# Patient Record
Sex: Male | Born: 1945 | Race: White | Hispanic: No | Marital: Married | Smoking: Current every day smoker
Health system: Southern US, Community
[De-identification: ages and names within clinical notes are randomized; demographics above are authoritative.]

## PROBLEM LIST (undated history)

## (undated) DIAGNOSIS — G629 Polyneuropathy, unspecified: Secondary | ICD-10-CM

## (undated) DIAGNOSIS — T8859XA Other complications of anesthesia, initial encounter: Secondary | ICD-10-CM

## (undated) DIAGNOSIS — I5022 Chronic systolic (congestive) heart failure: Secondary | ICD-10-CM

## (undated) DIAGNOSIS — F4310 Post-traumatic stress disorder, unspecified: Secondary | ICD-10-CM

## (undated) DIAGNOSIS — IMO0001 Reserved for inherently not codable concepts without codable children: Secondary | ICD-10-CM

## (undated) DIAGNOSIS — I472 Ventricular tachycardia, unspecified: Secondary | ICD-10-CM

## (undated) DIAGNOSIS — I219 Acute myocardial infarction, unspecified: Secondary | ICD-10-CM

## (undated) DIAGNOSIS — J449 Chronic obstructive pulmonary disease, unspecified: Secondary | ICD-10-CM

## (undated) DIAGNOSIS — I10 Essential (primary) hypertension: Secondary | ICD-10-CM

## (undated) DIAGNOSIS — D649 Anemia, unspecified: Secondary | ICD-10-CM

## (undated) DIAGNOSIS — T814XXS Infection following a procedure, sequela: Secondary | ICD-10-CM

## (undated) DIAGNOSIS — I255 Ischemic cardiomyopathy: Secondary | ICD-10-CM

## (undated) DIAGNOSIS — I739 Peripheral vascular disease, unspecified: Secondary | ICD-10-CM

## (undated) DIAGNOSIS — Z9289 Personal history of other medical treatment: Secondary | ICD-10-CM

## (undated) DIAGNOSIS — E1169 Type 2 diabetes mellitus with other specified complication: Secondary | ICD-10-CM

## (undated) DIAGNOSIS — I25119 Atherosclerotic heart disease of native coronary artery with unspecified angina pectoris: Secondary | ICD-10-CM

## (undated) DIAGNOSIS — E785 Hyperlipidemia, unspecified: Secondary | ICD-10-CM

## (undated) DIAGNOSIS — T4145XA Adverse effect of unspecified anesthetic, initial encounter: Secondary | ICD-10-CM

## (undated) DIAGNOSIS — Z8739 Personal history of other diseases of the musculoskeletal system and connective tissue: Secondary | ICD-10-CM

## (undated) DIAGNOSIS — K219 Gastro-esophageal reflux disease without esophagitis: Secondary | ICD-10-CM

## (undated) DIAGNOSIS — Z8711 Personal history of peptic ulcer disease: Secondary | ICD-10-CM

## (undated) DIAGNOSIS — Z8719 Personal history of other diseases of the digestive system: Secondary | ICD-10-CM

## (undated) DIAGNOSIS — E119 Type 2 diabetes mellitus without complications: Secondary | ICD-10-CM

## (undated) DIAGNOSIS — Z95810 Presence of automatic (implantable) cardiac defibrillator: Secondary | ICD-10-CM

## (undated) HISTORY — PX: INGUINAL HERNIA REPAIR: SUR1180

## (undated) HISTORY — PX: CARDIAC CATHETERIZATION: SHX172

## (undated) HISTORY — DX: Reserved for inherently not codable concepts without codable children: IMO0001

## (undated) HISTORY — DX: Infection following a procedure, sequela: T81.4XXS

---

## 1999-10-05 ENCOUNTER — Other Ambulatory Visit: Admission: RE | Admit: 1999-10-05 | Discharge: 1999-10-05 | Payer: Self-pay | Admitting: Gynecology

## 2011-09-16 ENCOUNTER — Encounter: Payer: Self-pay | Admitting: *Deleted

## 2011-09-16 ENCOUNTER — Emergency Department (HOSPITAL_COMMUNITY): Payer: Medicare Other

## 2011-09-16 ENCOUNTER — Inpatient Hospital Stay (HOSPITAL_COMMUNITY)
Admission: EM | Admit: 2011-09-16 | Discharge: 2011-09-20 | DRG: 728 | Disposition: A | Discharge status: Home / Self Care | Payer: Medicare Other | Source: Ambulatory Visit | Attending: Internal Medicine | Admitting: Internal Medicine

## 2011-09-16 DIAGNOSIS — Z79899 Other long term (current) drug therapy: Secondary | ICD-10-CM

## 2011-09-16 DIAGNOSIS — N453 Epididymo-orchitis: Principal | ICD-10-CM | POA: Diagnosis present

## 2011-09-16 DIAGNOSIS — Z7982 Long term (current) use of aspirin: Secondary | ICD-10-CM

## 2011-09-16 DIAGNOSIS — E7800 Pure hypercholesterolemia, unspecified: Secondary | ICD-10-CM | POA: Diagnosis present

## 2011-09-16 DIAGNOSIS — E785 Hyperlipidemia, unspecified: Secondary | ICD-10-CM | POA: Diagnosis present

## 2011-09-16 DIAGNOSIS — D72829 Elevated white blood cell count, unspecified: Secondary | ICD-10-CM | POA: Diagnosis present

## 2011-09-16 DIAGNOSIS — E1169 Type 2 diabetes mellitus with other specified complication: Secondary | ICD-10-CM

## 2011-09-16 DIAGNOSIS — E871 Hypo-osmolality and hyponatremia: Secondary | ICD-10-CM | POA: Diagnosis present

## 2011-09-16 DIAGNOSIS — E119 Type 2 diabetes mellitus without complications: Secondary | ICD-10-CM | POA: Diagnosis present

## 2011-09-16 DIAGNOSIS — Z6829 Body mass index (BMI) 29.0-29.9, adult: Secondary | ICD-10-CM

## 2011-09-16 DIAGNOSIS — Z23 Encounter for immunization: Secondary | ICD-10-CM

## 2011-09-16 HISTORY — DX: Type 2 diabetes mellitus with other specified complication: E11.69

## 2011-09-16 LAB — URINE MICROSCOPIC-ADD ON

## 2011-09-16 LAB — CBC
HCT: 41 % (ref 39.0–52.0)
MCHC: 35.6 g/dL (ref 30.0–36.0)
MCV: 87.8 fL (ref 78.0–100.0)
Platelets: 247 10*3/uL (ref 150–400)
RDW: 13 % (ref 11.5–15.5)

## 2011-09-16 LAB — COMPREHENSIVE METABOLIC PANEL
AST: 15 U/L (ref 0–37)
Albumin: 3.6 g/dL (ref 3.5–5.2)
BUN: 9 mg/dL (ref 6–23)
Calcium: 9.5 mg/dL (ref 8.4–10.5)
Creatinine, Ser: 0.94 mg/dL (ref 0.50–1.35)
Total Bilirubin: 0.5 mg/dL (ref 0.3–1.2)
Total Protein: 7.3 g/dL (ref 6.0–8.3)

## 2011-09-16 LAB — URINALYSIS, ROUTINE W REFLEX MICROSCOPIC
Glucose, UA: NEGATIVE mg/dL
Ketones, ur: NEGATIVE mg/dL
Protein, ur: NEGATIVE mg/dL
Urobilinogen, UA: 1 mg/dL (ref 0.0–1.0)

## 2011-09-16 MED ORDER — GLIPIZIDE ER 2.5 MG PO TB24
2.5000 mg | ORAL_TABLET | Freq: Every day | ORAL | Status: DC
  Administered 2011-09-17: 2.5 mg via ORAL
  Filled 2011-09-16 (×2): qty 1

## 2011-09-16 MED ORDER — NICOTINE 14 MG/24HR TD PT24
14.0000 mg | MEDICATED_PATCH | Freq: Every day | TRANSDERMAL | Status: DC
  Administered 2011-09-17 – 2011-09-20 (×4): 14 mg via TRANSDERMAL
  Filled 2011-09-16 (×4): qty 1

## 2011-09-16 MED ORDER — ONDANSETRON HCL 4 MG/2ML IJ SOLN
4.0000 mg | Freq: Once | INTRAMUSCULAR | Status: AC
  Administered 2011-09-16: 4 mg via INTRAVENOUS
  Filled 2011-09-16: qty 2

## 2011-09-16 MED ORDER — ENOXAPARIN SODIUM 40 MG/0.4ML ~~LOC~~ SOLN
40.0000 mg | SUBCUTANEOUS | Status: DC
  Administered 2011-09-17 – 2011-09-20 (×4): 40 mg via SUBCUTANEOUS
  Filled 2011-09-16 (×4): qty 0.4

## 2011-09-16 MED ORDER — HYDROCODONE-ACETAMINOPHEN 5-325 MG PO TABS
1.0000 | ORAL_TABLET | ORAL | Status: DC | PRN
  Administered 2011-09-17 – 2011-09-20 (×10): 2 via ORAL
  Filled 2011-09-16 (×10): qty 2

## 2011-09-16 MED ORDER — HYDROMORPHONE HCL PF 1 MG/ML IJ SOLN: 2.0000 mg | INTRAMUSCULAR | Status: DC | PRN

## 2011-09-16 MED ORDER — PIPERACILLIN-TAZOBACTAM 3.375 G IVPB
3.3750 g | Freq: Three times a day (TID) | INTRAVENOUS | Status: DC
  Administered 2011-09-16 – 2011-09-20 (×12): 3.375 g via INTRAVENOUS
  Filled 2011-09-16 (×15): qty 50

## 2011-09-16 MED ORDER — ASPIRIN EC 81 MG PO TBEC
81.0000 mg | DELAYED_RELEASE_TABLET | Freq: Every day | ORAL | Status: DC
  Administered 2011-09-17 – 2011-09-20 (×5): 81 mg via ORAL
  Filled 2011-09-16 (×7): qty 1

## 2011-09-16 MED ORDER — PIPERACILLIN-TAZOBACTAM 3.375 G IVPB 30 MIN: 3.3750 g | Freq: Three times a day (TID) | INTRAVENOUS | Status: DC

## 2011-09-16 MED ORDER — ALBUTEROL SULFATE (5 MG/ML) 0.5% IN NEBU: 2.5000 mg | INHALATION_SOLUTION | RESPIRATORY_TRACT | Status: DC | PRN

## 2011-09-16 MED ORDER — NIACIN ER (ANTIHYPERLIPIDEMIC) 500 MG PO TBCR
1000.0000 mg | EXTENDED_RELEASE_TABLET | Freq: Every day | ORAL | Status: DC
  Administered 2011-09-17 – 2011-09-19 (×4): 1000 mg via ORAL
  Filled 2011-09-16 (×7): qty 2

## 2011-09-16 MED ORDER — MORPHINE SULFATE 4 MG/ML IJ SOLN
4.0000 mg | Freq: Once | INTRAMUSCULAR | Status: AC
  Administered 2011-09-16: 4 mg via INTRAVENOUS
  Filled 2011-09-16: qty 1

## 2011-09-16 MED ORDER — PIPERACILLIN-TAZOBACTAM 3.375 G IVPB
3.3750 g | Freq: Once | INTRAVENOUS | Status: AC
  Administered 2011-09-16: 3.375 g via INTRAVENOUS
  Filled 2011-09-16: qty 50

## 2011-09-16 MED ORDER — ALBUTEROL SULFATE (5 MG/ML) 0.5% IN NEBU
2.5000 mg | INHALATION_SOLUTION | Freq: Four times a day (QID) | RESPIRATORY_TRACT | Status: DC
  Administered 2011-09-17 (×4): 2.5 mg via RESPIRATORY_TRACT
  Filled 2011-09-16 (×4): qty 0.5

## 2011-09-16 MED ORDER — ACETAMINOPHEN 650 MG RE SUPP: 650.0000 mg | Freq: Four times a day (QID) | RECTAL | Status: DC | PRN

## 2011-09-16 MED ORDER — OMEGA-3-ACID ETHYL ESTERS 1 G PO CAPS
2.0000 g | ORAL_CAPSULE | Freq: Every day | ORAL | Status: DC
  Administered 2011-09-17 – 2011-09-20 (×5): 2 g via ORAL
  Filled 2011-09-16 (×6): qty 2

## 2011-09-16 MED ORDER — ACETAMINOPHEN 325 MG PO TABS: 650.0000 mg | ORAL_TABLET | Freq: Four times a day (QID) | ORAL | Status: DC | PRN

## 2011-09-16 MED ORDER — VANCOMYCIN HCL 1000 MG IV SOLR
1250.0000 mg | Freq: Two times a day (BID) | INTRAVENOUS | Status: DC
  Administered 2011-09-17 – 2011-09-20 (×6): 1250 mg via INTRAVENOUS
  Filled 2011-09-16 (×9): qty 1250

## 2011-09-16 MED ORDER — VANCOMYCIN HCL IN DEXTROSE 1-5 GM/200ML-% IV SOLN
1000.0000 mg | Freq: Once | INTRAVENOUS | Status: AC
  Administered 2011-09-16: 1000 mg via INTRAVENOUS
  Filled 2011-09-16: qty 200

## 2011-09-16 MED ORDER — SODIUM CHLORIDE 0.9 % IV SOLN
INTRAVENOUS | Status: DC
  Administered 2011-09-16 – 2011-09-18 (×4): via INTRAVENOUS

## 2011-09-16 MED ORDER — ROSUVASTATIN CALCIUM 20 MG PO TABS
20.0000 mg | ORAL_TABLET | Freq: Every day | ORAL | Status: DC
  Administered 2011-09-17 – 2011-09-20 (×4): 20 mg via ORAL
  Filled 2011-09-16 (×5): qty 1

## 2011-09-16 MED ORDER — INSULIN ASPART 100 UNIT/ML ~~LOC~~ SOLN
0.0000 [IU] | Freq: Three times a day (TID) | SUBCUTANEOUS | Status: DC
  Administered 2011-09-17: 5 [IU] via SUBCUTANEOUS
  Administered 2011-09-17: 3 [IU] via SUBCUTANEOUS
  Administered 2011-09-17 – 2011-09-18 (×3): 2 [IU] via SUBCUTANEOUS
  Administered 2011-09-18: 3 [IU] via SUBCUTANEOUS
  Administered 2011-09-19: 2 [IU] via SUBCUTANEOUS
  Administered 2011-09-19: 3 [IU] via SUBCUTANEOUS
  Administered 2011-09-20: 2 [IU] via SUBCUTANEOUS
  Filled 2011-09-16: qty 3

## 2011-09-16 MED ORDER — METFORMIN HCL 500 MG PO TABS
500.0000 mg | ORAL_TABLET | Freq: Two times a day (BID) | ORAL | Status: DC
  Administered 2011-09-17 – 2011-09-20 (×6): 500 mg via ORAL
  Filled 2011-09-16 (×10): qty 1

## 2011-09-16 NOTE — ED Notes (Signed)
[id number] READY

## 2011-09-16 NOTE — ED Notes (Signed)
Patient transported to Ultrasound 

## 2011-09-16 NOTE — ED Provider Notes (Signed)
History     CSN: [id number]  Arrival date & time 09/16/11  1401   First MD Initiated Contact with Patient 09/16/11 1537      Chief Complaint  Patient presents with  . Testicle Pain    (Consider location/radiation/quality/duration/timing/severity/associated sxs/prior treatment) HPI Patient presents with complaint of pain in his left scrotum. 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 He was applying warm compresses and then developed acute onset of increased sharp and burning pain in his left scrotum. Then his scrotum began to swell. He's had no drainage from the area of abscess. He's had no fever or chills. He does have diabetes and states his last blood sugar last week was 79. He's had no vomiting or abdominal pain. He does have some burning with urination but no pain with defecation. There no other alleviating or modifying factors. There no other associated systemic symptoms.  Past Medical History  Diagnosis Date  . Diabetes mellitus     No past surgical history on file.  No family history on file.  History  Substance Use Topics  . Smoking status: Current Everyday Smoker  . Smokeless tobacco: Never Used  . Alcohol Use: No      Review of Systems ROS reviewed and otherwise negative except for mentioned in HPI  Allergies  Neosporin  Home Medications   Current Outpatient Rx  Name Route Sig Dispense Refill  . ASPIRIN EC 81 MG PO TBEC Oral Take 81 mg by mouth daily.      [name] GLIPIZIDE ER 2.5 MG PO TB24 Oral Take 2.5 mg by mouth daily.      [name] HYDROCHLOROTHIAZIDE 25 MG PO TABS Oral Take 25 mg by mouth daily.      . IBUPROFEN 200 MG PO TABS Oral Take 400 mg by mouth every 6 (six) hours as needed. For pain.     [name] METFORMIN HCL 500 MG PO TABS Oral Take 500 mg by mouth 2 (two) times daily with a meal.      . NAPROXEN 500 MG PO TABS Oral Take 500 mg by mouth daily.      [name] NIACIN (ANTIHYPERLIPIDEMIC) 500 MG PO  TBCR Oral Take 1,000 mg by mouth at bedtime.      [name] FISH OIL PO Oral Take 4 capsules by mouth 2 (two) times daily.      [name] SIMVASTATIN 80 MG PO TABS Oral Take 40 mg by mouth at bedtime.        BP 120/72  Pulse 92  Temp(Src) 98 F (36.7 C) (Oral)  Resp 18  SpO2 98% Vitals reviewed Physical Exam Physical Examination: General appearance - alert, well appearing, and in no distress Mental status - alert, oriented to person, place, and time Mouth - mucous membranes moist, pharynx normal without lesions Chest - clear to auscultation, no wheezes, rales or rhonchi, symmetric air entry Heart - normal rate, regular rhythm, normal S1, S2, no murmurs, rubs, clicks or gallops Abdomen - soft, nontender, nondistended, no masses or organomegaly, NABS GU Male - no penile lesions or discharge, left scrotum with edema, erythema, tenderness to palpation of the scrotum, area of skin breakdown on lower pole of left scrotum, no drainage, skin thickening, right testicle nontender, no masses Musculoskeletal - no joint tenderness, deformity or swelling Extremities - peripheral pulses normal, no pedal edema, no clubbing or cyanosis Skin - normal coloration and turgor, no rashes Lymph- no inguinal LAD  ED Course  Procedures (including critical care time)  7:47 PM d/w Dr. Vernie Ammons, urology- he agrees with vanc/zosyn and recommends medical admission  8:09 PM d/w Dr. Nedra Hai, triad for admission- he plans to call urology to discuss which team should be primary.     Labs Reviewed  URINALYSIS, ROUTINE W REFLEX MICROSCOPIC  CBC  COMPREHENSIVE METABOLIC PANEL   No results found.   No diagnosis found.    MDM  Patient presenting with swelling redness and pain of his left scrotum. Ultrasound reveals an epididymoorchitis. He was started on Vanco and Zosyn in the ED. He also has a significant leukocytosis. After phone consultation with urology patient to be admitted to triad for further  management.        Ethelda Chick, MD 09/18/11 [phone]

## 2011-09-16 NOTE — ED Notes (Signed)
MD at bedside. 

## 2011-09-16 NOTE — ED Notes (Signed)
MD at bedside.  Dr Marguerite Olea

## 2011-09-16 NOTE — ED Notes (Addendum)
Patient states he had a boil on his scrotum he had been placing heat packs on it to bring to boil to a head. 3 days ago the boil ruptured inside his scrotum and not his is swollen and extremely sore. Patient states this morning he started having pain when he urinates.

## 2011-09-16 NOTE — Progress Notes (Signed)
ANTIBIOTIC CONSULT NOTE - INITIAL  Pharmacy Consult for Vancomycin Indication: epididymo-orchitis  Allergies  Allergen Reactions  . Neosporin (Neomycin-Polymyx-Gramicid) Swelling and Rash    Patient Measurements: Height: 5\' 6"  (167.6 cm) Weight: 180 lb (81.647 kg) IBW/kg (Calculated) : 63.8    Vital Signs: Temp: 98 F (36.7 C) (12/27 1548) Temp src: Oral (12/27 1548) BP: 124/71 mmHg (12/27 2200) Pulse Rate: 86  (12/27 2200) Intake/Output from previous day:   Intake/Output from this shift:    Labs:  Basename 09/16/11 1609  WBC 25.3*  HGB 14.6  PLT 247  LABCREA --  CREATININE 0.94   Estimated Creatinine Clearance: 80.7 ml/min (by C-G formula based on Cr of 0.94). No results found for this basename: VANCOTROUGH:2,VANCOPEAK:2,VANCORANDOM:2,GENTTROUGH:2,GENTPEAK:2,GENTRANDOM:2,TOBRATROUGH:2,TOBRAPEAK:2,TOBRARND:2,AMIKACINPEAK:2,AMIKACINTROU:2,AMIKACIN:2, in the last 72 hours   Microbiology: No results found for this or any previous visit (from the past 720 hour(s)).  Medical History: Past Medical History  Diagnosis Date  . Diabetes mellitus     Medications:   (Not in a hospital admission) Assessment: 65 y/o male patient admitted with testicular pain requiring broad spectrum antibiotics for epididymo-orchitis. Received 1g vanc and zosyn 3.375g in ED at 1700.  Goal of Therapy:  Vancomycin trough level 10-15 mcg/ml  Plan:  Vancomycin 1250mg  IV q12h. Monitor renal function. Measure antibiotic drug levels at steady state  Verlene Mayer, PharmD, [place] Pager [phone] 09/16/2011,10:33 PM

## 2011-09-17 ENCOUNTER — Encounter (HOSPITAL_COMMUNITY): Payer: Self-pay | Admitting: *Deleted

## 2011-09-17 LAB — CBC
HCT: 37.2 % — ABNORMAL LOW (ref 39.0–52.0)
HCT: 37.8 % — ABNORMAL LOW (ref 39.0–52.0)
Hemoglobin: 13.2 g/dL (ref 13.0–17.0)
MCH: 31.1 pg (ref 26.0–34.0)
MCH: 30.7 pg (ref 26.0–34.0)
MCV: 87.1 fL (ref 78.0–100.0)
MCV: 87.9 fL (ref 78.0–100.0)
Platelets: 251 10*3/uL (ref 150–400)
RBC: 4.27 MIL/uL (ref 4.22–5.81)
RBC: 4.3 MIL/uL (ref 4.22–5.81)

## 2011-09-17 LAB — BASIC METABOLIC PANEL
CO2: 25 mEq/L (ref 19–32)
Calcium: 9.2 mg/dL (ref 8.4–10.5)
Chloride: 95 mEq/L — ABNORMAL LOW (ref 96–112)
Glucose, Bld: 147 mg/dL — ABNORMAL HIGH (ref 70–99)
Potassium: 4.1 mEq/L (ref 3.5–5.1)
Sodium: 132 mEq/L — ABNORMAL LOW (ref 135–145)

## 2011-09-17 LAB — GLUCOSE, CAPILLARY
Glucose-Capillary: 168 mg/dL — ABNORMAL HIGH (ref 70–99)
Glucose-Capillary: 219 mg/dL — ABNORMAL HIGH (ref 70–99)
Glucose-Capillary: 137 mg/dL — ABNORMAL HIGH (ref 70–99)

## 2011-09-17 MED ORDER — INFLUENZA VIRUS VACC SPLIT PF IM SUSP
0.5000 mL | INTRAMUSCULAR | Status: AC
  Administered 2011-09-17: 0.5 mL via INTRAMUSCULAR
  Filled 2011-09-17: qty 0.5

## 2011-09-17 NOTE — H&P (Signed)
PCP: None   Chief Complaint: Swollen left testicle.   HPI: [name] [name] is an 65 y.o. male with history of diabetes on oral hypoglycemic agents, hypertension, hypercholesterolemia, presents to [facility] emergency room complaining of progressive left testicular swelling and  pain.[name] He denied having dysuria, inguinal pain, but admitted to having one episode of chills. He denied any headache, nausea, vomiting, chest pain, or shortness of breath. He has no urethral discharge. He stated this started as a pimple on his scrotum and progressed rapidly to a firm scrotal mass. ER evaluation included a leukocytosis with a white count 25,000, a testicular ultrasound show complex hydrocele with evidence of epididymitis and orchitis but no abscess. There is no evidence of torsion. Urology consult was requested by the emergency room physician and deferred admission to hospitalist. Urological consult will be in the morning.  Rewiew of Systems:  The patient denies anorexia, fever, weight loss,, vision loss, decreased hearing, hoarseness, chest pain, syncope, dyspnea on exertion, peripheral edema, balance deficits, hemoptysis, abdominal pain, melena, hematochezia, severe indigestion/heartburn, hematuria, incontinence, genital sores, muscle weakness, suspicious skin lesions, transient blindness, difficulty walking, depression, unusual weight change, abnormal bleeding, enlarged lymph nodes, angioedema, and breast masses.   Past Medical History  Diagnosis Date  . Diabetes mellitus   . Hypertension   . Hypercholesteremia     No past surgical history on file.  Medications:  HOME MEDS: Prior to Admission medications   Medication Sig Start Date End Date Taking? Authorizing Provider  aspirin EC 81 MG tablet Take 81 mg by mouth daily.     Yes Historical Provider, MD  glipiZIDE (GLUCOTROL XL) 2.5 MG 24 hr tablet Take 2.5 mg by mouth daily.     Yes Historical Provider, MD  hydrochlorothiazide (HYDRODIURIL) 25 MG  tablet Take 25 mg by mouth daily.     Yes Historical Provider, MD  ibuprofen (ADVIL,MOTRIN) 200 MG tablet Take 400 mg by mouth every 6 (six) hours as needed. For pain.    Yes Historical Provider, MD  metFORMIN (GLUCOPHAGE) 500 MG tablet Take 500 mg by mouth 2 (two) times daily with a meal.     Yes Historical Provider, MD  naproxen (NAPROSYN) 500 MG tablet Take 500 mg by mouth daily.     Yes Historical Provider, MD  niacin (NIASPAN) 500 MG CR tablet Take 1,000 mg by mouth at bedtime.     Yes Historical Provider, MD  Omega-3 Fatty Acids (FISH OIL PO) Take 4 capsules by mouth 2 (two) times daily.     Yes Historical Provider, MD  simvastatin (ZOCOR) 80 MG tablet Take 40 mg by mouth at bedtime.     Yes Historical Provider, MD     Allergies:  Allergies  Allergen Reactions  . Neosporin (Neomycin-Polymyx-Gramicid) Swelling and Rash    Social History:   reports that he has been smoking.  He has never used smokeless tobacco. He reports that he does not drink alcohol or use illicit drugs.  Family History: No family history on file.   Physical Exam: Filed Vitals:   09/16/11 2222 09/16/11 2246 09/16/11 2256 09/16/11 2307  BP:  116/74 116/74 141/78  Pulse:  81 86 95  Temp:   97.7 F (36.5 C) 98.3 F (36.8 C)  TempSrc:   Oral Oral  Resp:    18  Height: 5\' 6"  (1.676 m)   5\' 6"  (1.676 m)  Weight: 81.647 kg (180 lb)   81.9 kg (180 lb 8.9 oz)  SpO2:  98% 98% 96%  Blood pressure 141/78, pulse 95, temperature 98.3 F (36.8 C), temperature source Oral, resp. rate 18, height 5\' 6"  (1.676 m), weight 81.9 kg (180 lb 8.9 oz), SpO2 96.00%.  GEN:  Pleasant  person lying in the stretcher in no acute distress; cooperative with exam PSYCH:  alert and oriented x4; does not appear anxious does not appear depressed; affect is normal HEENT: Mucous membranes pink and anicteric; PERRLA; EOM intact; no cervical lymphadenopathy nor thyromegaly or carotid bruit; no JVD; Breasts:: Not examined CHEST WALL: No  tenderness CHEST: Normal respiration, clear to auscultation bilaterally HEART: Regular rate and rhythm; no murmurs rubs or gallops BACK: No kyphosis or scoliosis; no CVA tenderness ABDOMEN: Obese, soft non-tender; no masses, no organomegaly, normal abdominal bowel sounds; no pannus; no intertriginous candida. Rectal Exam: Not done EXTREMITIES: No bone or joint deformity; age-appropriate arthropathy of the hands and knees; no edema; no ulcerations. Genitalia: Scrotum is swollen and erythematous. There is a small papule consistent with local folliculitis. The testicle on the left side feel firm and exquisitely tender. He has no urethral discharge. There is no palpable inguinal lymphadenopathy. PULSES: 2+ and symmetric SKIN: Normal hydration no rash or ulceration CNS: Cranial nerves 2-12 grossly intact no focal neurologic deficit   Labs & Imaging Results for orders placed during the hospital encounter of 09/16/11 (from the past 48 hour(s))  URINALYSIS, ROUTINE W REFLEX MICROSCOPIC     Status: Abnormal   Collection Time   09/16/11  3:56 PM      Component Value Range Comment   Color, Urine YELLOW  YELLOW     APPearance CLEAR  CLEAR     Specific Gravity, Urine 1.016  1.005 - 1.030     pH 7.5  5.0 - 8.0     Glucose, UA NEGATIVE  NEGATIVE (mg/dL)    Hgb urine dipstick TRACE (*) NEGATIVE     Bilirubin Urine NEGATIVE  NEGATIVE     Ketones, ur NEGATIVE  NEGATIVE (mg/dL)    Protein, ur NEGATIVE  NEGATIVE (mg/dL)    Urobilinogen, UA 1.0  0.0 - 1.0 (mg/dL)    Nitrite NEGATIVE  NEGATIVE     Leukocytes, UA SMALL (*) NEGATIVE    URINE MICROSCOPIC-ADD ON     Status: Normal   Collection Time   09/16/11  3:56 PM      Component Value Range Comment   Squamous Epithelial / LPF RARE  RARE     WBC, UA 3-6  <3 (WBC/hpf)    RBC / HPF 0-2  <3 (RBC/hpf)    Bacteria, UA RARE  RARE    CBC     Status: Abnormal   Collection Time   09/16/11  4:09 PM      Component Value Range Comment   WBC 25.3 (*) 4.0 -  10.5 (K/uL)    RBC 4.67  4.22 - 5.81 (MIL/uL)    Hemoglobin 14.6  13.0 - 17.0 (g/dL)    HCT 16.1  09.6 - 04.5 (%)    MCV 87.8  78.0 - 100.0 (fL)    MCH 31.3  26.0 - 34.0 (pg)    MCHC 35.6  30.0 - 36.0 (g/dL)    RDW 40.9  81.1 - 91.4 (%)    Platelets 247  150 - 400 (K/uL)   COMPREHENSIVE METABOLIC PANEL     Status: Abnormal   Collection Time   09/16/11  4:09 PM      Component Value Range Comment   Sodium 128 (*) 135 - 145 (mEq/L)  Potassium 4.3  3.5 - 5.1 (mEq/L)    Chloride 90 (*) 96 - 112 (mEq/L)    CO2 25  19 - 32 (mEq/L)    Glucose, Bld 119 (*) 70 - 99 (mg/dL)    BUN 9  6 - 23 (mg/dL)    Creatinine, Ser 7.82  0.50 - 1.35 (mg/dL)    Calcium 9.5  8.4 - 10.5 (mg/dL)    Total Protein 7.3  6.0 - 8.3 (g/dL)    Albumin 3.6  3.5 - 5.2 (g/dL)    AST 15  0 - 37 (U/L)    ALT 10  0 - 53 (U/L)    Alkaline Phosphatase 63  39 - 117 (U/L)    Total Bilirubin 0.5  0.3 - 1.2 (mg/dL)    GFR calc non Af Amer 87 (*) >90 (mL/min)    GFR calc Af Amer >90  >90 (mL/min)   GLUCOSE, CAPILLARY     Status: Abnormal   Collection Time   09/16/11 11:11 PM      Component Value Range Comment   Glucose-Capillary 211 (*) 70 - 99 (mg/dL)    Comment 1 Documented in Chart      Comment 2 Notify RN     CBC     Status: Abnormal   Collection Time   09/16/11 11:16 PM      Component Value Range Comment   WBC 23.6 (*) 4.0 - 10.5 (K/uL)    RBC 4.27  4.22 - 5.81 (MIL/uL)    Hemoglobin 13.3  13.0 - 17.0 (g/dL)    HCT 95.6 (*) 21.3 - 52.0 (%)    MCV 87.1  78.0 - 100.0 (fL)    MCH 31.1  26.0 - 34.0 (pg)    MCHC 35.8  30.0 - 36.0 (g/dL)    RDW 08.6  57.8 - 46.9 (%)    Platelets 251  150 - 400 (K/uL)   CREATININE, SERUM     Status: Normal   Collection Time   09/16/11 11:16 PM      Component Value Range Comment   Creatinine, Ser 0.85  0.50 - 1.35 (mg/dL)    GFR calc non Af Amer >90  >90 (mL/min)    GFR calc Af Amer >90  >90 (mL/min)    US Scrotum  09/16/2011  *RADIOLOGY REPORT*  Clinical Data:  65 year old  male with left scrotal swelling, pain and fever.  SCROTAL ULTRASOUND DOPPLER ULTRASOUND OF THE TESTICLES  Technique: Complete ultrasound examination of the testicles, epididymis, and other scrotal structures was performed.  Color and spectral Doppler ultrasound were also utilized to evaluate blood flow to the testicles.  Comparison:  None  Findings:  Right testis:  The right testicle is normal in appearance and size measuring 3.7 x 2.1 x 2.1 cm.  No focal testicular abnormalities are noted.  Left testis:  The left testicle is normal and size measuring 2.6 x 1.4 x 2.7 cm.  Increased flow within the left testicle is identified without focal lesion or abscess.  Right epididymis:  Unremarkable.  Left epididymis:  Enlarged with increased flow.  Hydocele:  Small bilateral hydroceles are noted, left greater than right.  The left hydrocele was mildly complicated.  Varicocele:  Absent  Pulsed Doppler interrogation of both testes demonstrates low resistance flow bilaterally.  Skin thickening is identified diffusely. A few mildly enlarged inguinal lymph nodes are likely reactive.  IMPRESSION: Left epididymo-orchitis with overlying skin thickening and small complicated left hydrocele which may be infected.  No evidence  of testicular torsion.  Original Report Authenticated By: Rosendo Gros, M.D.   [place] Art/ven Flow Abd Pelv Doppler  09/16/2011  *RADIOLOGY REPORT*  Clinical Data:  65 year old male with left scrotal swelling, pain and fever.  SCROTAL ULTRASOUND DOPPLER ULTRASOUND OF THE TESTICLES  Technique: Complete ultrasound examination of the testicles, epididymis, and other scrotal structures was performed.  Color and spectral Doppler ultrasound were also utilized to evaluate blood flow to the testicles.  Comparison:  None  Findings:  Right testis:  The right testicle is normal in appearance and size measuring 3.7 x 2.1 x 2.1 cm.  No focal testicular abnormalities are noted.  Left testis:  The left testicle is normal and  size measuring 2.6 x 1.4 x 2.7 cm.  Increased flow within the left testicle is identified without focal lesion or abscess.  Right epididymis:  Unremarkable.  Left epididymis:  Enlarged with increased flow.  Hydocele:  Small bilateral hydroceles are noted, left greater than right.  The left hydrocele was mildly complicated.  Varicocele:  Absent  Pulsed Doppler interrogation of both testes demonstrates low resistance flow bilaterally.  Skin thickening is identified diffusely. A few mildly enlarged inguinal lymph nodes are likely reactive.  IMPRESSION: Left epididymo-orchitis with overlying skin thickening and small complicated left hydrocele which may be infected.  No evidence of testicular torsion.  Original Report Authenticated By: Rosendo Gros, M.D.      Assessment Present on Admission:  .Orchitis and epididymitis .Hyperlipidemia Diabetes, oral hypoglycemics Hyponatremia   PLAN: Will admit him to the hospital. He'll be given normal saline along with vancomycin and Zosyn. Pain medication will also be given Dilaudid. I will continue his Glucophage and glyburide, but will add insulin sliding scale to control his blood glucose better during infection. Urology will consult the morning, but I agree that no surgical intervention is needed at this time. He is stable, full code, and will be admitted to [facility]ad hospitalist service. I strongly recommended that he quit smoking, and will order a nicotine patch for him.   Other plans as per orders.    Tahliyah Anagnos 09/17/2011, 2:41 AM

## 2011-09-17 NOTE — Progress Notes (Signed)
5:21 PM I agree with HPI/Gand A/P per Dr. Conley Rolls   Had an infected boil 2 weesk ago and day before xmas it poopped and then developed swelling ands like a hot water type of pain. No fever chills.  Has been feeling better today.  No nausea, no vomiting, no cp/sob       BP 123/61  Pulse 88  Temp(Src) 97.8 F (36.6 C) (Oral)  Resp 18  Ht 5\' 6"  (1.676 m)  Wt 82.7 kg (182 lb 5.1 oz)  BMI 29.43 kg/m2  SpO2 95% General appearance: alert, cooperative and appears stated age Lungs: clear to auscultation bilaterally and normal percussion bilaterally Male genitalia: normal, abnormal findings: hydrocele left Brawny edema in the L suprapubic fat pad   Patient Active Hospital Problem List: Orchitis and epididymitis (09/16/2011)   Assessment:Continue Vancomycin and zosyn d#1 CBC am Will ask Urology to formally see.  Consult appreciated in advance  DM (diabetes mellitus) (09/16/2011)   Assessment: moderate control-on oral hypoglycemics with ssi coverage.    Hyperlipidemia (09/16/2011)   Assessment: cont statin    Pleas Koch, MD [facility] Hospitalist [phone]

## 2011-09-18 ENCOUNTER — Encounter (HOSPITAL_COMMUNITY)
Admission: EM | Disposition: A | Discharge status: Home / Self Care | Payer: Self-pay | Source: Ambulatory Visit | Attending: Internal Medicine

## 2011-09-18 ENCOUNTER — Inpatient Hospital Stay (HOSPITAL_COMMUNITY): Payer: Medicare Other | Admitting: Anesthesiology

## 2011-09-18 ENCOUNTER — Encounter (HOSPITAL_COMMUNITY): Payer: Self-pay | Admitting: Anesthesiology

## 2011-09-18 DIAGNOSIS — E871 Hypo-osmolality and hyponatremia: Secondary | ICD-10-CM | POA: Diagnosis present

## 2011-09-18 LAB — BASIC METABOLIC PANEL
Calcium: 9.1 mg/dL (ref 8.4–10.5)
GFR calc Af Amer: >90 mL/min (ref >90)
GFR calc non Af Amer: >90 mL/min (ref >90)
Glucose, Bld: 204 mg/dL — ABNORMAL HIGH (ref 70–99)
Potassium: 3.9 mEq/L (ref 3.5–5.1)
Sodium: 129 mEq/L — ABNORMAL LOW (ref 135–145)

## 2011-09-18 LAB — CBC
Hemoglobin: 13.4 g/dL (ref 13.0–17.0)
MCH: 31.1 pg (ref 26.0–34.0)
Platelets: 281 10*3/uL (ref 150–400)
RBC: 4.31 MIL/uL (ref 4.22–5.81)
WBC: 17.5 10*3/uL — ABNORMAL HIGH (ref 4.0–10.5)

## 2011-09-18 LAB — GLUCOSE, CAPILLARY
Glucose-Capillary: 159 mg/dL — ABNORMAL HIGH (ref 70–99)
Glucose-Capillary: 180 mg/dL — ABNORMAL HIGH (ref 70–99)

## 2011-09-18 SURGERY — INCISION AND DRAINAGE, ABSCESS
Anesthesia: General | Site: Scrotum | Wound class: Dirty or Infected

## 2011-09-18 MED ORDER — LACTATED RINGERS IV SOLN
INTRAVENOUS | Status: DC | PRN
  Administered 2011-09-18 (×2): via INTRAVENOUS

## 2011-09-18 MED ORDER — MIDAZOLAM HCL 5 MG/5ML IJ SOLN
INTRAMUSCULAR | Status: DC | PRN
  Administered 2011-09-18: 2 mg via INTRAVENOUS

## 2011-09-18 MED ORDER — ALBUTEROL SULFATE (5 MG/ML) 0.5% IN NEBU: 2.5000 mg | INHALATION_SOLUTION | RESPIRATORY_TRACT | Status: DC | PRN

## 2011-09-18 MED ORDER — PROPOFOL 10 MG/ML IV EMUL
INTRAVENOUS | Status: DC | PRN
  Administered 2011-09-18: 220 mg via INTRAVENOUS
  Administered 2011-09-18: 160 mg via INTRAVENOUS

## 2011-09-18 MED ORDER — LIDOCAINE HCL (CARDIAC) 20 MG/ML IV SOLN: INTRAVENOUS | Status: DC | PRN

## 2011-09-18 MED ORDER — SODIUM CHLORIDE 0.9 % IV SOLN
200.0000 ug | INTRAVENOUS | Status: DC | PRN
  Administered 2011-09-18: .7 ug via INTRAVENOUS

## 2011-09-18 MED ORDER — MEPERIDINE HCL 25 MG/ML IJ SOLN: 6.2500 mg | INTRAMUSCULAR | Status: DC | PRN

## 2011-09-18 MED ORDER — SODIUM CHLORIDE 0.9 % IV SOLN
0.4000 ug/kg/h | INTRAVENOUS | Status: DC
  Filled 2011-09-18: qty 2

## 2011-09-18 MED ORDER — IPRATROPIUM BROMIDE 0.02 % IN SOLN: 0.5000 mg | RESPIRATORY_TRACT | Status: DC | PRN

## 2011-09-18 MED ORDER — HYDROMORPHONE HCL PF 1 MG/ML IJ SOLN: 0.2500 mg | INTRAMUSCULAR | Status: DC | PRN

## 2011-09-18 MED ORDER — ONDANSETRON HCL 4 MG/2ML IJ SOLN: 4.0000 mg | Freq: Once | INTRAMUSCULAR | Status: DC | PRN

## 2011-09-18 MED ORDER — FENTANYL CITRATE 0.05 MG/ML IJ SOLN
INTRAMUSCULAR | Status: DC | PRN
  Administered 2011-09-18 (×3): 50 ug via INTRAVENOUS
  Administered 2011-09-18 (×2): 100 ug via INTRAVENOUS

## 2011-09-18 MED ORDER — SUCCINYLCHOLINE CHLORIDE 20 MG/ML IJ SOLN
INTRAMUSCULAR | Status: DC | PRN
  Administered 2011-09-18: 50 mg via INTRAVENOUS

## 2011-09-18 SURGICAL SUPPLY — 18 items
CABLE HIGH FREQUENCY MONO STRZ (ELECTRODE) ×2 IMPLANT
COVER SURGICAL LIGHT HANDLE (MISCELLANEOUS) ×2 IMPLANT
DRAPE LAPAROTOMY T 102X78X121 (DRAPES) ×2 IMPLANT
DRSG PAD ABDOMINAL 8X10 ST (GAUZE/BANDAGES/DRESSINGS) ×2 IMPLANT
ELECT REM PT RETURN 9FT ADLT (ELECTROSURGICAL) ×3
ELECTRODE REM PT RTRN 9FT ADLT (ELECTROSURGICAL) ×1 IMPLANT
GAUZE PACKING IODOFORM 1 (PACKING) ×2 IMPLANT
GLOVE BIO SURGEON STRL SZ 6 (GLOVE) ×2 IMPLANT
GLOVE BIOGEL M 7.0 STRL (GLOVE) ×2 IMPLANT
GOWN BRE IMP SLV AUR LG STRL (GOWN DISPOSABLE) ×4 IMPLANT
KIT BASIN OR (CUSTOM PROCEDURE TRAY) ×2 IMPLANT
PACK SURGICAL SETUP 50X90 (CUSTOM PROCEDURE TRAY) ×2 IMPLANT
SPONGE GAUZE 4X4 12PLY (GAUZE/BANDAGES/DRESSINGS) ×2 IMPLANT
SWAB CULTURE LIQUID MINI MALE (MISCELLANEOUS) ×4 IMPLANT
TOWEL NATURAL 10PK STERILE (DISPOSABLE) ×2 IMPLANT
TOWEL NATURAL 4PK STERILE (DISPOSABLE) ×2 IMPLANT
TUBE SUCT ARGYLE STRL (TUBING) ×2 IMPLANT
YANKAUER SUCT BULB TIP NO VENT (SUCTIONS) ×2 IMPLANT

## 2011-09-18 NOTE — Anesthesia Preprocedure Evaluation (Addendum)
Anesthesia Evaluation  Patient identified by MRN, date of birth, ID band Patient awake    Reviewed: Allergy & Precautions, H&P , NPO status , Patient's Chart, lab work & pertinent test results, reviewed documented beta blocker date and time   Airway Mallampati: II TM Distance: >3 FB Neck ROM: Full    Dental  (+) Upper Dentures and Dental Advisory Given   Pulmonary    + wheezing      Cardiovascular hypertension, Pt. 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.Marland KitchenMarland KitchenOncology insuline since admission d/t infection  Renal/GU      Musculoskeletal   Abdominal   Peds  Hematology   Anesthesia Other Findings   Reproductive/Obstetrics                         Anesthesia Physical Anesthesia Plan  ASA: III  Anesthesia Plan: General   Post-op Pain Management:    Induction: Rapid sequence, Cricoid pressure planned and Intravenous  Airway Management Planned: Oral ETT  Additional Equipment:   Intra-op Plan:   Post-operative Plan: Extubation in OR  Informed Consent:   Dental advisory given  Plan Discussed with: CRNA and Surgeon  Anesthesia Plan Comments:       Anesthesia Quick Evaluation

## 2011-09-18 NOTE — Consult Note (Addendum)
Urology Consult  Referring physician: Dr. Jenean Lindau Reason for referral: Scrotal swelling and pain   Chief Complaint: Scrotal swelling and pain.  History of Present Illness: patient is a 65 years old male who was seen in the emergency room and 2 days ago complaining of progressive left testicular swelling and pain. He states that it started as a pimple  about 4 days ago.  However he continued to have a swelling of the scrotum. Scrotal ultrasound showed bilateral hydrocele with the left one being complex. There is evidence of epididymitis and orchitis but no abscess. On physical examination this morning the scrotum is swollen, fluctuant and tender. The left cord is also swollen and tender. He has a history of diabetes and is on metformin and glipizide.  Past Medical History  Diagnosis Date  . Diabetes mellitus   . Hypertension   . Hypercholesteremia    No past surgical history on file.  Medications: Metformin, Glipizide, Hydrodiuril,Ibuprofen,Naproxen, Niacin, Simvastatin  Allergies:  Allergies  Allergen Reactions  . Neosporin (Neomycin-Polymyx-Gramicid) Swelling and Rash    Father died of abdominal aneurysm.  Mother died of heart disease and complications from diabetes. Social History:  reports that he has been smoking.  He has never used smokeless tobacco. He reports that he does not drink alcohol or use illicit drugs. He is married and has one child.  ROS: All systems are reviewed and negative except as noted.   Physical Exam:  Vital signs in last 24 hours: Temp:  [97.8 F (36.6 C)-98.2 F (36.8 C)] 98.1 F (36.7 C) (12/29 0543) Pulse Rate:  [88-98] 98  (12/29 0543) Resp:  [18-20] 20  (12/29 0543) BP: (116-133)/(57-63) 116/57 mmHg (12/29 0543) SpO2:  [92 %-99 %] 92 % (12/29 0543) Weight:  [181 lb 7 oz (82.3 kg)] 181 lb 7 oz (82.3 kg) (12/29 0543)  Head: Normal. He has pink conjunctivae. Ears, nose, throat: Within normal limits.  Neck: Supple, nontender. No thyromegaly,  no cervical adenopathy.  Cardiovascular: Skin warm; not flushed Respiratory: Breaths quiet; no shortness of breath Abdomen: No masses Neurological: Normal sensation to touch Musculoskeletal: Normal motor function arms and legs Lymphatics: No inguinal adenopathy Skin: No rashes Genitourinary: Penis is swollen, nontender. The scrotum is swollen. The left scrotum is fluctuant and edematous.  The left cord is also swollen and tender. The right scrotum feels normal. The right testis is normal. The left testis is tender.  Rectal examination: Sphincter tone is normal, prostate is enlarged 40 g, nontender. Seminal vesicles are not palpable.  Laboratory Data:  Results for orders placed during the hospital encounter of 09/16/11 (from the past 72 hour(s))  URINALYSIS, ROUTINE W REFLEX MICROSCOPIC     Status: Abnormal   Collection Time   09/16/11  3:56 PM      Component Value Range Comment   Color, Urine YELLOW  YELLOW     APPearance CLEAR  CLEAR     Specific Gravity, Urine 1.016  1.005 - 1.030     pH 7.5  5.0 - 8.0     Glucose, UA NEGATIVE  NEGATIVE (mg/dL)    Hgb urine dipstick TRACE (*) NEGATIVE     Bilirubin Urine NEGATIVE  NEGATIVE     Ketones, ur NEGATIVE  NEGATIVE (mg/dL)    Protein, ur NEGATIVE  NEGATIVE (mg/dL)    Urobilinogen, UA 1.0  0.0 - 1.0 (mg/dL)    Nitrite NEGATIVE  NEGATIVE     Leukocytes, UA SMALL (*) NEGATIVE    URINE MICROSCOPIC-ADD ON  Status: Normal   Collection Time   09/16/11  3:56 PM      Component Value Range Comment   Squamous Epithelial / LPF RARE  RARE     WBC, UA 3-6  <3 (WBC/hpf)    RBC / HPF 0-2  <3 (RBC/hpf)    Bacteria, UA RARE  RARE    CBC     Status: Abnormal   Collection Time   09/16/11  4:09 PM      Component Value Range Comment   WBC 25.3 (*) 4.0 - 10.5 (K/uL)    RBC 4.67  4.22 - 5.81 (MIL/uL)    Hemoglobin 14.6  13.0 - 17.0 (g/dL)    HCT 16.1  09.6 - 04.5 (%)    MCV 87.8  78.0 - 100.0 (fL)    MCH 31.3  26.0 - 34.0 (pg)    MCHC 35.6  30.0 -  36.0 (g/dL)    RDW 40.9  81.1 - 91.4 (%)    Platelets 247  150 - 400 (K/uL)   COMPREHENSIVE METABOLIC PANEL     Status: Abnormal   Collection Time   09/16/11  4:09 PM      Component Value Range Comment   Sodium 128 (*) 135 - 145 (mEq/L)    Potassium 4.3  3.5 - 5.1 (mEq/L)    Chloride 90 (*) 96 - 112 (mEq/L)    CO2 25  19 - 32 (mEq/L)    Glucose, Bld 119 (*) 70 - 99 (mg/dL)    BUN 9  6 - 23 (mg/dL)    Creatinine, Ser 7.82  0.50 - 1.35 (mg/dL)    Calcium 9.5  8.4 - 10.5 (mg/dL)    Total Protein 7.3  6.0 - 8.3 (g/dL)    Albumin 3.6  3.5 - 5.2 (g/dL)    AST 15  0 - 37 (U/L)    ALT 10  0 - 53 (U/L)    Alkaline Phosphatase 63  39 - 117 (U/L)    Total Bilirubin 0.5  0.3 - 1.2 (mg/dL)    GFR calc non Af Amer 87 (*) >90 (mL/min)    GFR calc Af Amer >90  >90 (mL/min)   GLUCOSE, CAPILLARY     Status: Abnormal   Collection Time   09/16/11 11:11 PM      Component Value Range Comment   Glucose-Capillary 211 (*) 70 - 99 (mg/dL)    Comment 1 Documented in Chart      Comment 2 Notify RN     HEMOGLOBIN A1C     Status: Abnormal   Collection Time   09/16/11 11:16 PM      Component Value Range Comment   Hemoglobin A1C 7.1 (*) <5.7 (%)    Mean Plasma Glucose 157 (*) <117 (mg/dL)   CBC     Status: Abnormal   Collection Time   09/16/11 11:16 PM      Component Value Range Comment   WBC 23.6 (*) 4.0 - 10.5 (K/uL)    RBC 4.27  4.22 - 5.81 (MIL/uL)    Hemoglobin 13.3  13.0 - 17.0 (g/dL)    HCT 95.6 (*) 21.3 - 52.0 (%)    MCV 87.1  78.0 - 100.0 (fL)    MCH 31.1  26.0 - 34.0 (pg)    MCHC 35.8  30.0 - 36.0 (g/dL)    RDW 08.6  57.8 - 46.9 (%)    Platelets 251  150 - 400 (K/uL)   CREATININE, SERUM  Status: Normal   Collection Time   09/16/11 11:16 PM      Component Value Range Comment   Creatinine, Ser 0.85  0.50 - 1.35 (mg/dL)    GFR calc non Af Amer >90  >90 (mL/min)    GFR calc Af Amer >90  >90 (mL/min)   BASIC METABOLIC PANEL     Status: Abnormal   Collection Time   09/17/11  6:05 AM        Component Value Range Comment   Sodium 132 (*) 135 - 145 (mEq/L)    Potassium 4.1  3.5 - 5.1 (mEq/L)    Chloride 95 (*) 96 - 112 (mEq/L)    CO2 25  19 - 32 (mEq/L)    Glucose, Bld 147 (*) 70 - 99 (mg/dL)    BUN 7  6 - 23 (mg/dL)    Creatinine, Ser 1.61  0.50 - 1.35 (mg/dL)    Calcium 9.2  8.4 - 10.5 (mg/dL)    GFR calc non Af Amer 89 (*) >90 (mL/min)    GFR calc Af Amer >90  >90 (mL/min)   CBC     Status: Abnormal   Collection Time   09/17/11  6:05 AM      Component Value Range Comment   WBC 18.4 (*) 4.0 - 10.5 (K/uL)    RBC 4.30  4.22 - 5.81 (MIL/uL)    Hemoglobin 13.2  13.0 - 17.0 (g/dL)    HCT 09.6 (*) 04.5 - 52.0 (%)    MCV 87.9  78.0 - 100.0 (fL)    MCH 30.7  26.0 - 34.0 (pg)    MCHC 34.9  30.0 - 36.0 (g/dL)    RDW 40.9  81.1 - 91.4 (%)    Platelets 253  150 - 400 (K/uL)   GLUCOSE, CAPILLARY     Status: Abnormal   Collection Time   09/17/11  7:35 AM      Component Value Range Comment   Glucose-Capillary 168 (*) 70 - 99 (mg/dL)    Comment 1 Notify RN     GLUCOSE, CAPILLARY     Status: Abnormal   Collection Time   09/17/11 12:05 PM      Component Value Range Comment   Glucose-Capillary 219 (*) 70 - 99 (mg/dL)    Comment 1 Notify RN     GLUCOSE, CAPILLARY     Status: Abnormal   Collection Time   09/17/11  5:04 PM      Component Value Range Comment   Glucose-Capillary 137 (*) 70 - 99 (mg/dL)    Comment 1 Notify RN     GLUCOSE, CAPILLARY     Status: Abnormal   Collection Time   09/17/11 10:19 PM      Component Value Range Comment   Glucose-Capillary 159 (*) 70 - 99 (mg/dL)   CBC     Status: Abnormal   Collection Time   09/18/11  6:00 AM      Component Value Range Comment   WBC 17.5 (*) 4.0 - 10.5 (K/uL)    RBC 4.31  4.22 - 5.81 (MIL/uL)    Hemoglobin 13.4  13.0 - 17.0 (g/dL)    HCT 78.2 (*) 95.6 - 52.0 (%)    MCV 88.4  78.0 - 100.0 (fL)    MCH 31.1  26.0 - 34.0 (pg)    MCHC 35.2  30.0 - 36.0 (g/dL)    RDW 21.3  08.6 - 57.8 (%)    Platelets 281  150 -  400 (  K/uL)   GLUCOSE, CAPILLARY     Status: Abnormal   Collection Time   09/18/11  7:55 AM      Component Value Range Comment   Glucose-Capillary 164 (*) 70 - 99 (mg/dL)    Comment 1 Notify RN     BASIC METABOLIC PANEL     Status: Abnormal   Collection Time   09/18/11  9:50 AM      Component Value Range Comment   Sodium 129 (*) 135 - 145 (mEq/L)    Potassium 3.9  3.5 - 5.1 (mEq/L)    Chloride 94 (*) 96 - 112 (mEq/L)    CO2 26  19 - 32 (mEq/L)    Glucose, Bld 204 (*) 70 - 99 (mg/dL)    BUN 6  6 - 23 (mg/dL)    Creatinine, Ser 1.61  0.50 - 1.35 (mg/dL)    Calcium 9.1  8.4 - 10.5 (mg/dL)    GFR calc non Af Amer >90  >90 (mL/min)    GFR calc Af Amer >90  >90 (mL/min)   GLUCOSE, CAPILLARY     Status: Abnormal   Collection Time   09/18/11 11:58 AM      Component Value Range Comment   Glucose-Capillary 135 (*) 70 - 99 (mg/dL)    No results found for this or any previous visit (from the past 240 hour(s)). Creatinine:  Basename 09/18/11 0950 09/17/11 0605 09/16/11 2316 09/16/11 1609  CREATININE 0.86 0.89 0.85 0.94     Impression/Assessment:  Left scrotal abscess. Diabetes. Hypertension. Hypercholesterolemia.   Plan:  The patient needs incision and drainage of scrotal abscess. This was discussed with him and his wife. The procedure, risks and benefits were explained to them. The risks include but are not limited to hemorrhage, loss of testicle, sepsis. The understand and wish to proceed.  Sheily Lineman-HENRY 09/18/2011, 12:37 PM

## 2011-09-18 NOTE — Op Note (Signed)
[name] [name] is a 65 y.o.   09/18/2011  General  Surgeon: Wendie Simmer. [name] [name]  Pre-op diagnosis: Scrotal abscess  Postop diagnosis: Same  Procedure done: Incision and drainage of scrotal abscess.   Indication: Patient is a 65 years old male who noticed  a pimple in the scrotum about 4 days ago. He opened up 4 days ago but he continued to have a swelling of the scrotum. It is  associated with pain. Scrotal ultrasound showed the bilateral hydrocele left greater than right with the left one being complex. On examination today the left side of the scrotum is swollen, fluctuant and tender. He has a scrotal abscess and needs incision and drainage.  Procedure: The patient was identified by his wrist band and proper timeout was taken.  Under general anesthesia he was prepped and draped and placed in the supine position. A longitudinal incision was made on the left scrotum at the midpoint of the fluctuant area. Then 50 cc of frank pus were drained out of the abscess .Aerobes and anaerobes cultures were obtained. The incision was then extended to the length of the abscess cavity. The abscess was limited to the scrotum and did not extend to the inguinal area. The testicle was not involved in the process. The wound was then irrigated with normal saline.  Hemostasis was secured with electrocautery. There was no evidence of hemorrhage at the end of the procedure. Then a 1 inch iodoform gauze was placed in the wound.  The patient tolerated the procedure well and left the OR in satisfactory condition.  EBL: Minimal.  Needles, sponges count: Correct.

## 2011-09-18 NOTE — Plan of Care (Signed)
Problem: Phase II Progression Outcomes Goal: Wound without signs/symptoms of infection, decreasing edema Outcome: Progressing WENT FOR I AND D TODAY OF SCROTAL ABSCESS, WILL CONTINUE CELLULITIS PATH AS THIS IS DRIVING HIS ADMISSION

## 2011-09-18 NOTE — Transfer of Care (Signed)
Immediate Anesthesia Transfer of Care Note  Patient: [name] [name]  Procedure(s) Performed:  INCISION AND DRAINAGE ABSCESS - scrotal abscess  Patient Location: PACU  Anesthesia Type: General  Level of Consciousness: responds to stimulation  Airway & Oxygen Therapy: Patient placed on Ventilator (see vital sign flow sheet for setting)  Post-op Assessment: Report given to PACU RN and Patient moving all extremities  Post vital signs: Reviewed and unstable  Complications: respiratory complications

## 2011-09-18 NOTE — Progress Notes (Signed)
[facility]ITAL[facility] progress note    Interval h/o:- 65 y/o male presented 12.28 complaining of progressive left testicular swelling and pain. He states that it started as a pimple about 4 days ago. However he continued to have a swelling of the scrotum. Scrotal ultrasound showed bilateral hydrocele with the left one being complex. There is evidence of epididymitis and orchitis but no abscess. Seen by Dr. Brunilda Payor 12.29, ecision for I&D  Subjective: Well.  No signif pain.  Eating dinner.  Family present Treatment Team:  [place] Siren Objective: Vital signs in last 24 hours: Temp:  [98 F (36.7 C)-99.6 F (37.6 C)] 98.4 F (36.9 C) (12/29 1720) Pulse Rate:  [86-131] 92  (12/29 1720) Resp:  [14-42] 18  (12/29 1720) BP: (101-148)/(57-82) 123/77 mmHg (12/29 1720) SpO2:  [92 %-100 %] 96 % (12/29 1720) FiO2 (%):  [40 %] 40 % (12/29 1533) Weight:  [82.3 kg (181 lb 7 oz)] 181 lb 7 oz (82.3 kg) (12/29 0543) Weight change: 0.653 kg (1 lb 7 oz)  Intake/Output Summary (Last 24 hours) at 09/18/11 1845 Last data filed at 09/18/11 1814  Gross per 24 hour  Intake 4990.67 ml  Output   5125 ml  Net -134.33 ml    BP 123/77  Pulse 92  Temp(Src) 98.4 F (36.9 C) (Oral)  Resp 18  Ht 5\' 6"  (1.676 m)  Wt 82.3 kg (181 lb 7 oz)  BMI 29.28 kg/m2  SpO2 96% General appearance: alert, cooperative and appears stated age  Lungs: clear to auscultation bilaterally and normal percussion bilaterally  Male genitalia: Dressings applied.  Wound not examined today  Lab Results:  [facility] 09/18/11 0950 09/17/11 0605  NA 129* 132*  K 3.9 4.1  CL 94* 95*  CO2 26 25  GLUCOSE 204* 147*  BUN 6 7  CREATININE 0.86 0.89  CALCIUM 9.1 9.2  MG -- --  PHOS -- --    Basename 09/16/11 1609  AST 15  ALT 10  ALKPHOS 63  BILITOT 0.5  PROT 7.3  ALBUMIN 3.6     Basename 09/18/11 0600 09/17/11 0605  WBC 17.5* 18.4*  NEUTROABS -- --  HGB 13.4 13.2  HCT 38.1* 37.8*  MCV 88.4 87.9  PLT 281 253    Basename  09/16/11 2316  HGBA1C 7.1*       Medications: I have reviewed the patient's current medications. Scheduled Meds:   . aspirin EC  81 mg Oral Daily  . enoxaparin  40 mg Subcutaneous Q24H  . insulin aspart  0-15 Units Subcutaneous TID WC  . metFORMIN  500 mg Oral BID WC  . niacin  1,000 mg Oral QHS  . nicotine  14 mg Transdermal Daily  . omega-3 acid ethyl esters  2 g Oral Daily  . piperacillin-tazobactam (ZOSYN)  IV  3.375 g Intravenous Q8H  . rosuvastatin  20 mg Oral Daily  . vancomycin  1,250 mg Intravenous Q12H  . DISCONTD: albuterol  2.5 mg Nebulization Q6H   Continuous Infusions:   . sodium chloride 100 mL/hr at 09/18/11 0949  . DISCONTD: dexmedetomidine (PRECEDEX) IV infusion     PRN Meds:.acetaminophen, acetaminophen, albuterol, HYDROcodone-acetaminophen, HYDROmorphone, ipratropium, DISCONTD: albuterol, DISCONTD: HYDROmorphone, DISCONTD: meperidine, DISCONTD: ondansetron (ZOFRAN) IV   Assessment/Plan: Patient Active Hospital Problem List: Orchitis and epididymitis (09/16/2011)   Assessment: d#0 s/p op I & d Will continue Abx per Urology-cbc am Appreciate input DM (diabetes mellitus) (09/16/2011)   Assessment: cbg 132-143-continue SSI. Change to Mealtme coverage Hyperlipidemia (09/16/2011)   Assessment: Statin  on d/c Hyponatremia (09/18/2011)   Assessment: Likely 2/2 to Sepsis vs NPO state-rpt BMET am    LOS: 2 days   [facility] 09/18/2011, 6:45 PM

## 2011-09-18 NOTE — Anesthesia Postprocedure Evaluation (Signed)
  Anesthesia Post-op Note  Patient: [name] [name]  Procedure(s) Performed:  INCISION AND DRAINAGE ABSCESS - scrotal abscess  Patient Location: PACU  Anesthesia Type: General  Level of Consciousness: awake, alert  and oriented  Airway and Oxygen Therapy: Patient Spontanous Breathing and Patient connected to nasal cannula oxygen  Post-op Pain: mild  Post-op Assessment: Post-op Vital signs reviewed and Patient's Cardiovascular Status Stable  Post-op Vital Signs: stable  Complications: No apparent anesthesia complications

## 2011-09-18 NOTE — Progress Notes (Signed)
Extubated in PACU  By Dr Noreene Larsson.  Patient placed on 15lpm simple mask.  Hr 109, sats 100%. BP 136/73.  Patient turned over to [employer] staff.

## 2011-09-19 LAB — BASIC METABOLIC PANEL
CO2: 26 mEq/L (ref 19–32)
Calcium: 9 mg/dL (ref 8.4–10.5)
GFR calc non Af Amer: >90 mL/min (ref >90)
Potassium: 3.9 mEq/L (ref 3.5–5.1)
Sodium: 131 mEq/L — ABNORMAL LOW (ref 135–145)

## 2011-09-19 LAB — GLUCOSE, CAPILLARY: Glucose-Capillary: 172 mg/dL — ABNORMAL HIGH (ref 70–99)

## 2011-09-19 LAB — CBC
Hemoglobin: 12.4 g/dL — ABNORMAL LOW (ref 13.0–17.0)
MCV: 87.7 fL (ref 78.0–100.0)
Platelets: 273 10*3/uL (ref 150–400)
RBC: 4.14 MIL/uL — ABNORMAL LOW (ref 4.22–5.81)
WBC: 14.1 10*3/uL — ABNORMAL HIGH (ref 4.0–10.5)

## 2011-09-19 MED ORDER — POLYETHYLENE GLYCOL 3350 17 G PO PACK
17.0000 g | PACK | Freq: Every day | ORAL | Status: DC
  Administered 2011-09-19 – 2011-09-20 (×2): 17 g via ORAL
  Filled 2011-09-19 (×2): qty 1

## 2011-09-19 NOTE — Progress Notes (Signed)
[facility]ITAL[facility] progress note    Interval h/o:- 65 y/o male presented 12.28 complaining of progressive left testicular swelling and pain. He states that it started as a pimple about 4 days ago. However he continued to have a swelling of the scrotum. Scrotal ultrasound showed bilateral hydrocele with the left one being complex. There is evidence of epididymitis and orchitis but no abscess. Seen by Dr. Brunilda Payor 12.29, decision for I&D made and done.  Subjective: Well.  No signif pain. Has some constipation and has not had a stool since admit.  Treatment Team:  [place] Siren Objective: Vital signs in last 24 hours: Temp:  [97.7 F (36.5 C)-98.8 F (37.1 C)] 98.8 F (37.1 C) (12/30 1459) Pulse Rate:  [78-90] 90  (12/30 1459) Resp:  [18] 18  (12/30 1459) BP: (125-142)/(67-85) 142/68 mmHg (12/30 1459) SpO2:  [95 %-99 %] 95 % (12/30 1459) Weight:  [80.922 kg (178 lb 6.4 oz)] 178 lb 6.4 oz (80.922 kg) (12/30 0651) Weight change: -1.378 kg (-3 lb 0.6 oz)  Intake/Output Summary (Last 24 hours) at 09/19/11 1758 Last data filed at 09/19/11 1600  Gross per 24 hour  Intake    860 ml  Output   3550 ml  Net  -2690 ml    BP 142/68  Pulse 90  Temp(Src) 98.8 F (37.1 C) (Oral)  Resp 18  Ht 5\' 6"  (1.676 m)  Wt 80.922 kg (178 lb 6.4 oz)  BMI 28.79 kg/m2  SpO2 95% General appearance: alert, cooperative and appears stated age  Lungs: clear to auscultation bilaterally and normal percussion bilaterally  Male genitalia: Dressings applied.  Wound not examined today  Lab Results:  [facility] 09/19/11 0705 09/18/11 0950  NA 131* 129*  K 3.9 3.9  CL 98 94*  CO2 26 26  GLUCOSE 160* 204*  BUN 6 6  CREATININE 0.84 0.86  CALCIUM 9.0 9.1  MG -- --  PHOS -- --   No results found for this basename: AST:2,ALT:2,ALKPHOS:2,BILITOT:2,PROT:2,ALBUMIN:2 in the last 72 hours   Basename 09/19/11 0705 09/18/11 0600  WBC 14.1* 17.5*  NEUTROABS -- --  HGB 12.4* 13.4  HCT 36.3* 38.1*  MCV 87.7 88.4    PLT 273 281    Basename 09/16/11 2316  HGBA1C 7.1*   Medications: I have reviewed the patient's current medications. Scheduled Meds:    . aspirin EC  81 mg Oral Daily  . enoxaparin  40 mg Subcutaneous Q24H  . insulin aspart  0-15 Units Subcutaneous TID WC  . metFORMIN  500 mg Oral BID WC  . niacin  1,000 mg Oral QHS  . nicotine  14 mg Transdermal Daily  . omega-3 acid ethyl esters  2 g Oral Daily  . piperacillin-tazobactam (ZOSYN)  IV  3.375 g Intravenous Q8H  . rosuvastatin  20 mg Oral Daily  . vancomycin  1,250 mg Intravenous Q12H   Continuous Infusions:    . DISCONTD: sodium chloride 100 mL/hr at 09/18/11 0949   PRN Meds:.acetaminophen, acetaminophen, albuterol, HYDROcodone-acetaminophen, HYDROmorphone, ipratropium   Assessment/Plan: Patient Active Hospital Problem List: Orchitis and epididymitis (09/16/2011)   Assessment: d#1 s/p op I & d Continue IV abx-narrow as per Urology instructions=WBC 17.5>>14 Continue Pain medicines  Will continue Abx per Urology-cbc am Appreciate input  DM (diabetes mellitus) (09/16/2011)   Assessment: cbg 132-143-continue SSI. Change to Mealtme coverage  Hyperlipidemia (09/16/2011)   Assessment: Statin on d/c  Hyponatremia (09/18/2011)   Assessment: Likely 2/2 to Sepsis vs NPO state-rpt BMET shows improvement in Sodium.  Dm-CBG  117-172, continue SSI and Metformin    LOS: 3 days   Raziah Funnell,JAI 09/19/2011, 5:58 PM

## 2011-09-19 NOTE — Progress Notes (Signed)
ANTIBIOTIC CONSULT NOTE - FOLLOW UP  Pharmacy Consult for Vancomycin Indication: Testicular cellulitis  Allergies  Allergen Reactions  . Neosporin (Neomycin-Polymyx-Gramicid) Swelling and Rash    Patient Measurements: Height: 5\' 6"  (167.6 cm) Weight: 178 lb 6.4 oz (80.922 kg) IBW/kg (Calculated) : 63.8    Vital Signs: Temp: 97.7 F (36.5 C) (12/30 0646) Temp src: Oral (12/29 2110) BP: 127/77 mmHg (12/30 0646) Pulse Rate: 78  (12/30 0646) Intake/Output from previous day: 12/29 0701 - 12/30 0700 In: 3141.7 [P.O.:360; I.V.:2181.7; IV Piggyback:600] Out: 4900 [Urine:4900] Intake/Output from this shift:    Labs:  Basename 09/19/11 0705 09/18/11 0950 09/18/11 0600 09/17/11 0605 09/16/11 2316  WBC 14.1* -- 17.5* 18.4* --  HGB 12.4* -- 13.4 13.2 --  PLT 273 -- 281 253 --  LABCREA -- -- -- -- --  CREATININE -- 0.86 -- 0.89 0.85   Estimated Creatinine Clearance: 87.8 ml/min (by C-G formula based on Cr of 0.86). No results found for this basename: VANCOTROUGH:2,VANCOPEAK:2,VANCORANDOM:2,GENTTROUGH:2,GENTPEAK:2,GENTRANDOM:2,TOBRATROUGH:2,TOBRAPEAK:2,TOBRARND:2,AMIKACINPEAK:2,AMIKACINTROU:2,AMIKACIN:2, in the last 72 hours   Microbiology: Recent Results (from the past 720 hour(s))  CULTURE, ROUTINE-ABSCESS     Status: Normal (Preliminary result)   Collection Time   09/18/11  2:57 PM      Component Value Range Status Comment   Specimen Description ABSCESS SCROTUM   Final    Special Requests NONE   Final    Gram Stain PENDING   Incomplete    Culture NO GROWTH 1 DAY   Final    Report Status PENDING   Incomplete     Anti-infectives     Start     Dose/Rate Route Frequency Ordered Stop   09/17/11 0500   vancomycin (VANCOCIN) 1,250 mg in sodium chloride 0.9 % 250 mL IVPB        1,250 mg 166.7 mL/hr over 90 Minutes Intravenous Every 12 hours 09/16/11 2232     09/16/11 2245  piperacillin-tazobactam (ZOSYN) IVPB 3.375 g       3.375 g 12.5 mL/hr over 240 Minutes Intravenous 3  times per day 09/16/11 2232     09/16/11 2230   piperacillin-tazobactam (ZOSYN) IVPB 3.375 g  Status:  Discontinued        3.375 g 100 mL/hr over 30 Minutes Intravenous 3 times per day 09/16/11 2229 09/16/11 2230   09/16/11 1615   vancomycin (VANCOCIN) IVPB 1000 mg/200 mL premix        1,000 mg 200 mL/hr over 60 Minutes Intravenous  Once 09/16/11 1608 09/16/11 1743   09/16/11 1615  piperacillin-tazobactam (ZOSYN) IVPB 3.375 g       3.375 g 12.5 mL/hr over 240 Minutes Intravenous  Once 09/16/11 1608 09/16/11 2043          Assessment Pt is improving. WBC 14.1  Had I&D by Dr Brunilda Payor on 12/29 Goal of Therapy:  Vancomycin trough level 10-15 mcg/ml  Plan:  Continue Vancomycin at 1250 mg q12hr. If continued, will probably check a level 12/31.  Eugene Garnet 09/19/2011,7:58 AM

## 2011-09-19 NOTE — Progress Notes (Signed)
T:  97.7   BP:  126/67   P:  78    R:  18  Feels much better today.  Scrotal pain is less.  Tolerates diet well.  Minimal wound drainage.  The cord is firm, slightly tender.  There are no fluctuant areas.  Plan: Continue antibiotics.  Wet to dry dressing.  Will remove packing in AM.

## 2011-09-20 LAB — GLUCOSE, CAPILLARY: Glucose-Capillary: 98 mg/dL (ref 70–99)

## 2011-09-20 MED ORDER — HYDROCODONE-ACETAMINOPHEN 5-325 MG PO TABS: 1.0000 | ORAL_TABLET | ORAL | Status: AC | PRN

## 2011-09-20 MED ORDER — NICOTINE 14 MG/24HR TD PT24: 1.0000 | MEDICATED_PATCH | Freq: Every day | TRANSDERMAL | Status: AC

## 2011-09-20 MED ORDER — DOXYCYCLINE HYCLATE 50 MG PO CAPS: 100.0000 mg | ORAL_CAPSULE | Freq: Two times a day (BID) | ORAL | Status: AC

## 2011-09-20 NOTE — Progress Notes (Signed)
T:  98.1   BP: 126/69   P:  77   R:  18  Has less scrotal pain.  Tolerates diet well.  Moderate wound drainage.  I have removed the packing and replaced it with wet gauze.  Wound culture: no growth after 1 day.  Patient can be discharged from my standpoint on oral antibiotics.    He is instructed how to change dressing.  Sitz baths at home.  Follow-up in 1-2 weeks.

## 2011-09-20 NOTE — Progress Notes (Signed)
Patient discharged to home in care of wife and daughter. Medications and instructions reviewed with patient as well as dressing change instructions. Supplies given to family. IV d/c'd with cath intact. Assessment unchanged from this am. Patient is to followup with Dr. Brunilda Payor in 1 week.

## 2011-09-20 NOTE — Discharge Summary (Signed)
Physician Discharge Summary  Patient ID: [name] [name] MRN: [id number] DOB/AGE: 01-17-1946 65 y.o.  Admit date: 09/16/2011 Discharge date: 09/20/2011  Admission Diagnoses: Orchitis  Discharge Diagnoses:  Principal Problem:  *Orchitis and epididymitis Active Problems:  DM (diabetes mellitus)  Hyperlipidemia  Hyponatremia   Discharged Condition: good  Hospital Course: 65 y.o. male with history of diabetes on oral hypoglycemic agents, hypertension, hypercholesterolemia, presents to [facility] emergency room complaining of progressive left testicular swelling and pain. States started 4 days prior to admission with a pimle on his scrotum and then progressive swelling worsened.  admitted to having one episode of chills. He denied any headache, nausea, vomiting, chest pain, or shortness of breath. He has no urethral discharge. There was evidence of epididymitis and orchitis but no abscess.  the scrotum was found to be swollen, fluctuant and tender. He did well in the post op periods and has been instructed to do wet-dry dressings He will continue Doxycycline for likely Staph coverage  Consults: urology was consulted and elected to do an I & D, 50cc of frank pus was noted which was sent for culture  Significant Diagnostic Studies: ER evaluation included a leukocytosis with a white count 25,000, a testicular ultrasound show complex hydrocele with evidence of epididymitis and orchitis but no abscess. There is no evidence of torsion.   Treatments: IV hydration, antibiotics: vancomycin and Zosyn, analgesia: Morphine and surgery: Incision and drainage 12.29  Discharge Exam: Blood pressure 133/70, pulse 70, temperature 97.7 F (36.5 C), temperature source Axillary, resp. rate 20, height 5\' 6"  (1.676 m), weight 81.693 kg (180 lb 1.6 oz), SpO2 95.00%. General appearance: alert and cooperative Head: Normocephalic, without obvious abnormality, atraumatic Eyes: conjunctivae/corneas clear. PERRL,  EOM's intact. Fundi benign. Throat: lips, mucosa, and tongue normal; teeth and gums normal Neck: no JVD and thyroid not enlarged, symmetric, no tenderness/mass/nodules Resp: clear to auscultation bilaterally Male genitalia: normal, deferred Extremities: extremities normal, atraumatic, no cyanosis or edema  Disposition: Final discharge disposition not confirmed   Medication List  As of 09/20/2011  2:21 PM   START taking these medications         doxycycline 50 MG capsule   Commonly known as: VIBRAMYCIN   Take 2 capsules (100 mg total) by mouth 2 (two) times daily.      HYDROcodone-acetaminophen 5-325 MG per tablet   Commonly known as: NORCO   Take 1-2 tablets by mouth every 4 (four) hours as needed.      nicotine 14 mg/24hr patch   Commonly known as: NICODERM CQ - dosed in mg/24 hours   Place 1 patch onto the skin daily.         CONTINUE taking these medications         aspirin EC 81 MG tablet      FISH OIL PO      glipiZIDE 2.5 MG 24 hr tablet   Commonly known as: GLUCOTROL XL      hydrochlorothiazide 25 MG tablet   Commonly known as: HYDRODIURIL      metFORMIN 500 MG tablet   Commonly known as: GLUCOPHAGE      niacin 500 MG CR tablet   Commonly known as: NIASPAN      simvastatin 80 MG tablet   Commonly known as: ZOCOR         STOP taking these medications         naproxen 500 MG tablet         ASK your doctor about these medications  ibuprofen 200 MG tablet   Commonly known as: ADVIL,MOTRIN          Where to get your medications    These are the prescriptions that you need to pick up.   You may get these medications from any pharmacy.         doxycycline 50 MG capsule   HYDROcodone-acetaminophen 5-325 MG per tablet   nicotine 14 mg/24hr patch           Follow-up Information    Call NESI,MARC-HENRY, MD. (If symptoms worsen)    Contact information:   [address] [place], [address] [facility]  [place] [address] [phone]          Please follow final cultures from I &d done 12/29  Signed: Pleas Koch 09/20/2011, 2:21 PM

## 2011-09-20 NOTE — Progress Notes (Signed)
09/20/2011 [name] [name] SPARKS Case Management Note [phone]   Utilization review completed.  

## 2011-09-21 LAB — CULTURE, ROUTINE-ABSCESS

## 2011-09-22 LAB — ANAEROBIC CULTURE: Gram Stain: NONE SEEN

## 2012-03-02 ENCOUNTER — Encounter (HOSPITAL_COMMUNITY): Payer: Self-pay | Admitting: *Deleted

## 2012-03-02 ENCOUNTER — Emergency Department (HOSPITAL_COMMUNITY)
Admission: EM | Admit: 2012-03-02 | Discharge: 2012-03-03 | Disposition: A | Discharge status: Home / Self Care | Payer: Medicare Other | Attending: Emergency Medicine | Admitting: Emergency Medicine

## 2012-03-02 DIAGNOSIS — E119 Type 2 diabetes mellitus without complications: Secondary | ICD-10-CM | POA: Insufficient documentation

## 2012-03-02 DIAGNOSIS — K859 Acute pancreatitis without necrosis or infection, unspecified: Secondary | ICD-10-CM | POA: Insufficient documentation

## 2012-03-02 DIAGNOSIS — I10 Essential (primary) hypertension: Secondary | ICD-10-CM | POA: Insufficient documentation

## 2012-03-02 DIAGNOSIS — A048 Other specified bacterial intestinal infections: Secondary | ICD-10-CM | POA: Insufficient documentation

## 2012-03-02 DIAGNOSIS — K8020 Calculus of gallbladder without cholecystitis without obstruction: Secondary | ICD-10-CM | POA: Insufficient documentation

## 2012-03-02 DIAGNOSIS — K259 Gastric ulcer, unspecified as acute or chronic, without hemorrhage or perforation: Secondary | ICD-10-CM | POA: Insufficient documentation

## 2012-03-02 DIAGNOSIS — K8050 Calculus of bile duct without cholangitis or cholecystitis without obstruction: Secondary | ICD-10-CM

## 2012-03-02 LAB — POCT I-STAT, CHEM 8
BUN: 7 mg/dL (ref 6–23)
Calcium, Ion: 1.2 mmol/L (ref 1.12–1.32)
Chloride: 94 mEq/L — ABNORMAL LOW (ref 96–112)
Creatinine, Ser: 1 mg/dL (ref 0.50–1.35)
Glucose, Bld: 119 mg/dL — ABNORMAL HIGH (ref 70–99)
HCT: 51 % (ref 39.0–52.0)
Sodium: 131 mEq/L — ABNORMAL LOW (ref 135–145)

## 2012-03-02 MED ORDER — IOHEXOL 300 MG/ML  SOLN
20.0000 mL | INTRAMUSCULAR | Status: AC
  Administered 2012-03-02: 20 mL via ORAL

## 2012-03-02 NOTE — ED Notes (Signed)
Report received from Wendy RN.

## 2012-03-02 NOTE — ED Notes (Addendum)
Family states, "The doctor said his blood work came back positive for H pylori. We are worried about that." Rancour, MD notified re: pt family concerns

## 2012-03-02 NOTE — ED Notes (Signed)
Sent to ED for eval of gall stones dx on ultrasound this am. States he has been in pain for the past week.

## 2012-03-02 NOTE — ED Provider Notes (Signed)
History     CSN: [id number]  Arrival date & time 03/02/12  1729   None     Chief Complaint  Patient presents with  . Cholelithiasis    (Consider location/radiation/quality/duration/timing/severity/associated sxs/prior treatment) HPI  66 year old male past medical history diabetes hypertension hypercholesterolemia and gastric ulcer presents today with six-month history of abdominal pain much worse after eating. The pain is sharp, 8/10. No nausea no vomiting.  He went to rapid care this morning and was referred to outpatient imaging.  Imaging demonstrated gallstones, with no obstruction. Also demonstrated atypical pancreatic findings. Lipase was mildly elevated. H. pylori positive.  Patient then referred to medicine for further workup and management. On arrival the patient has no abdominal pain. He has no tenderness to palpation per his own report. His vital signs are normal.  Past Medical History  Diagnosis Date  . Diabetes mellitus   . Hypertension   . Hypercholesteremia     Past Surgical History  Procedure Date  . Hernia repair     History reviewed. No pertinent family history.  History  Substance Use Topics  . Smoking status: Current Everyday Smoker  . Smokeless tobacco: Never Used  . Alcohol Use: No      Review of Systems Constitutional: Negative for fever and chills.  HENT: Negative for ear pain, sore throat and trouble swallowing.   Eyes: Negative for pain and visual disturbance.  Respiratory: Negative for cough and shortness of breath.   Cardiovascular: Negative for chest pain and leg swelling.  Gastrointestinal: POS for nausea, neg vomiting, POS abdominal pain and neg diarrhea.  Genitourinary: Negative for dysuria, urgency and frequency.  Musculoskeletal: Negative for back pain and joint swelling.  Skin: Negative for rash and wound.  Neurological: Negative for dizziness, syncope, speech difficulty, weakness and numbness.   Allergies  Neosporin  Home  Medications   Current Outpatient Rx  Name Route Sig Dispense Refill  . ALPHA LIPOIC ACID PO Oral Take 100 mg by mouth 2 (two) times daily.    . ASPIRIN EC 81 MG PO TBEC Oral Take 81 mg by mouth daily.      [name] VITAMIN D-3 PO Oral Take 2 tablets by mouth daily.    [name] GLIPIZIDE ER 2.5 MG PO TB24 Oral Take 2.5 mg by mouth daily.      [name] HYDROCHLOROTHIAZIDE 25 MG PO TABS Oral Take 25 mg by mouth daily.      [name] METFORMIN HCL 500 MG PO TABS Oral Take 500 mg by mouth 2 (two) times daily with a meal.      . NIACIN ER (ANTIHYPERLIPIDEMIC) 500 MG PO TBCR Oral Take 1,000 mg by mouth at bedtime.      [name] FISH OIL PO Oral Take 4 capsules by mouth 2 (two) times daily.      [name] OMEPRAZOLE-SODIUM BICARBONATE 40-1100 MG PO CAPS Oral Take 1 capsule by mouth daily before breakfast.    . SIMVASTATIN 80 MG PO TABS Oral Take 40 mg by mouth at bedtime.      . AMOXICILLIN 500 MG PO CAPS Oral Take 2 capsules (1,000 mg total) by mouth 2 (two) times daily. 56 capsule 0  . CLARITHROMYCIN 500 MG PO TABS Oral Take 1 tablet (500 mg total) by mouth 2 (two) times daily. 28 tablet 0  . HYDROCODONE-ACETAMINOPHEN 5-325 MG PO TABS Oral Take 1-2 tablets by mouth every 4 (four) hours as needed for pain. 20 tablet 0  . OMEPRAZOLE 20 MG PO CPDR Oral Take 1 capsule (20  mg total) by mouth daily. In the evening. Continue taking your morning dose of 40 mg. 14 capsule 0    BP 123/89  Pulse 78  Temp 97.6 F (36.4 C) (Oral)  Resp 16  SpO2 91%  Physical Exam Consitutional: Pt in no acute distress.   Head: Normocephalic and atraumatic.  Eyes: Extraocular motion intact, no scleral icterus Neck: Supple without meningismus, mass, or overt JVD Respiratory: Effort normal and breath sounds normal. No respiratory distress. CV: Heart regular rate and regular rhythm (sinus), no obvious murmurs.  Pulses +2 and symmetric Abdomen: Soft, non-tender, non-distended. No rebound or guarding.  MSK: Extremities are atraumatic without deformity, ROM  intact Skin: Warm, dry, intact Neuro: Alert and oriented, no motor deficit noted.   Psychiatric: Mood and affect are normal    ED Course  Procedures (including critical care time)  Labs Reviewed  GLUCOSE, CAPILLARY - Abnormal; Notable for the following:    Glucose-Capillary 109 (*)     All other components within normal limits  POCT I-STAT, CHEM 8 - Abnormal; Notable for the following:    Sodium 131 (*)     Chloride 94 (*)     Glucose, Bld 119 (*)     Hemoglobin 17.3 (*)     All other components within normal limits  LIPASE, BLOOD - Abnormal; Notable for the following:    Lipase 176 (*)     All other components within normal limits  CBC - Abnormal; Notable for the following:    WBC 15.9 (*)     All other components within normal limits  DIFFERENTIAL - Abnormal; Notable for the following:    Neutro Abs 10.5 (*)     Monocytes Absolute 1.4 (*)     All other components within normal limits  HEPATIC FUNCTION PANEL   Ct Abdomen Pelvis W Contrast  03/03/2012  *RADIOLOGY REPORT*  Clinical Data: Cholelithiasis.  Possible pancreatitis.  CT ABDOMEN AND PELVIS WITH CONTRAST  Technique:  Multidetector CT imaging of the abdomen and pelvis was performed following the standard protocol during bolus administration of intravenous contrast.  Contrast: OMNIPAQUE IOHEXOL 300 MG/ML  SOLN  Comparison: Ultrasound of 03/02/2012 [facility]).  Findings: Incompletely imaged left lower lobe lung nodule which measures 5 mm on image 1. Normal heart size without pericardial or pleural effusion.  Right coronary artery disease.  Small hiatal hernia.  Normal liver, spleen.  Underdistended proximal stomach.  The pancreatic body and tail are unremarkable.  There is subtle soft tissue fullness within the pancreatic head/uncinate process with surrounding edema.  Example image 31 of series 2.  This is presumably related to the clinical history pancreatitis.  No common duct dilatation. Pancreatic duct upper  normal in region of pancreatic body, including on coronal image 42.  Cholelithiasis without acute cholecystitis.  No vascular complication.  No peripancreatic fluid collection.  Normal right adrenal gland.  A left adrenal nodule measures 1.2 cm and is indeterminate.  Normal kidneys.  Mild perirenal edema is symmetric and nonspecific. Mild non aneurysmal dilatation of the infrarenal aorta; 2.6 cm.  No surrounding hemorrhage.  Iliac atherosclerosis which is advanced for age. No retroperitoneal or retrocrural adenopathy.  The rectosigmoid appears thick-walled on image 57, felt to be due to underdistension.  Separate area of apparent descending/sigmoid junction wall thickening image 59.  Normal appendix. Normal small bowel without abdominal ascites.  Fat containing left inguinal hernia. No pelvic adenopathy. Normal urinary bladder and prostate.  No significant free fluid. Scattered tiny sclerotic lesions  which are likely bone islands.  IMPRESSION:  1.  Subtle soft tissue fullness of and edema surrounding the pancreatic head/uncinate process.  This is most likely related to pancreatitis, given clinical history and elevated pancreatic enzymes.  Given the borderline dilatation of pancreatic duct, consider follow-up with pre post contrast abdominal MRI when the patient is improved from the acute symptoms to exclude unlikely underlying obstructive pancreatic head mass. 2.  Cholelithiasis without acute cholecystitis or biliary ductal dilatation. 3.  Advanced atherosclerosis, including within the coronary arteries.  Non aneurysmal aortic dilatation. 4.  Foci of apparent colonic wall thickening.  Favored to be due to underdistension.  If the patient has not undergone recent colonoscopy, this should be considered with attention to these areas. 5.  Left lower lobe lung nodule. If the patient is at high risk for bronchogenic carcinoma, follow-up chest CT at 6-12 months is recommended.  If the patient is at low risk for  bronchogenic carcinoma, follow-up chest CT at 12 months is recommended.  This recommendation follows the consensus statement: "Guidelines for Management of Small Pulmonary Nodules Detected on CT Scans: A Statement from the Fleischner Society" as published in Radiology 2005; 237:395-400. Online at: DietDisorder.cz. 6.  Left adrenal nodule which is indeterminate.  This could also be evaluated on follow-up MRI.  Original Report Authenticated By: Consuello Bossier, M.D.     1. Biliary colic   2. Pancreatitis   3. Gastric ulcer   4. H. pylori infection       MDM  Clinical impression is of gastric ulcer.   Patient has a history of the same and says this feels just like it. He has not right upper quadrant but epigastric. If the patient does not eat, he has no pain. Also, the patient reports eating nonfatty foods still cause pain. There may also be biliary colic  He arrives with normal vital signs and no discomfort at all.  Given the outside studies indicating a swollen edematous pancreas, we will CT scan abdomen looking for pancreatic mass.   We will repeat laboratory work.  CT scan demonstrates edematous pancreas, recommend MRI for further clarification and to rule out pancreatic mass.  Also CT demonstrated pulmonary nodule.  The patient still looks very well on exam. His H. pylori diagnosis made today, I will treat him for H. pylori, gave him precautions about drug interactions between clarithromycin and simvastatin.  We also discussed the importance of MRI of the pancreas and CT followup for lung mass.  I prescribed pain medications as well as triple therapy. Patient discharged home pain free, with normal vital signs. He has followup with a surgeon for his possible biliary colic.  PT DC home stable.  Discussed with pt the clinical impression, treatment in the ED, and follow up plan.  We alslo discussed the indications for returning to the ED, which include shortness or  breath, confusion, fever, new weakness or numbness, chest pain, or any other concerning symptom.  The pt understood the treatment and plan, is stable, and is able to leave the ED.            Larrie Kass, MD 03/03/12 [phone]

## 2012-03-03 ENCOUNTER — Telehealth (INDEPENDENT_AMBULATORY_CARE_PROVIDER_SITE_OTHER): Payer: Self-pay | Admitting: General Surgery

## 2012-03-03 ENCOUNTER — Emergency Department (HOSPITAL_COMMUNITY): Payer: Medicare Other

## 2012-03-03 ENCOUNTER — Encounter (HOSPITAL_COMMUNITY): Payer: Self-pay | Admitting: Radiology

## 2012-03-03 ENCOUNTER — Encounter (HOSPITAL_COMMUNITY): Payer: Self-pay | Admitting: Nurse Practitioner

## 2012-03-03 ENCOUNTER — Inpatient Hospital Stay (HOSPITAL_COMMUNITY)
Admission: EM | Admit: 2012-03-03 | Discharge: 2012-03-06 | DRG: 418 | Disposition: A | Discharge status: Home / Self Care | Payer: Medicare Other | Source: Ambulatory Visit | Attending: Surgery | Admitting: Surgery

## 2012-03-03 DIAGNOSIS — K851 Biliary acute pancreatitis without necrosis or infection: Secondary | ICD-10-CM | POA: Diagnosis present

## 2012-03-03 DIAGNOSIS — E119 Type 2 diabetes mellitus without complications: Secondary | ICD-10-CM | POA: Diagnosis present

## 2012-03-03 DIAGNOSIS — Z7982 Long term (current) use of aspirin: Secondary | ICD-10-CM

## 2012-03-03 DIAGNOSIS — K8020 Calculus of gallbladder without cholecystitis without obstruction: Secondary | ICD-10-CM | POA: Insufficient documentation

## 2012-03-03 DIAGNOSIS — K859 Acute pancreatitis without necrosis or infection, unspecified: Secondary | ICD-10-CM

## 2012-03-03 DIAGNOSIS — I10 Essential (primary) hypertension: Secondary | ICD-10-CM | POA: Diagnosis present

## 2012-03-03 DIAGNOSIS — E871 Hypo-osmolality and hyponatremia: Secondary | ICD-10-CM | POA: Diagnosis present

## 2012-03-03 DIAGNOSIS — K219 Gastro-esophageal reflux disease without esophagitis: Secondary | ICD-10-CM | POA: Diagnosis present

## 2012-03-03 DIAGNOSIS — K8050 Calculus of bile duct without cholangitis or cholecystitis without obstruction: Secondary | ICD-10-CM | POA: Diagnosis present

## 2012-03-03 DIAGNOSIS — R1013 Epigastric pain: Secondary | ICD-10-CM

## 2012-03-03 DIAGNOSIS — K429 Umbilical hernia without obstruction or gangrene: Secondary | ICD-10-CM | POA: Diagnosis present

## 2012-03-03 DIAGNOSIS — E785 Hyperlipidemia, unspecified: Secondary | ICD-10-CM | POA: Diagnosis present

## 2012-03-03 DIAGNOSIS — K8010 Calculus of gallbladder with chronic cholecystitis without obstruction: Secondary | ICD-10-CM

## 2012-03-03 DIAGNOSIS — R109 Unspecified abdominal pain: Secondary | ICD-10-CM | POA: Insufficient documentation

## 2012-03-03 DIAGNOSIS — F17200 Nicotine dependence, unspecified, uncomplicated: Secondary | ICD-10-CM | POA: Diagnosis present

## 2012-03-03 HISTORY — DX: Essential (primary) hypertension: I10

## 2012-03-03 LAB — CBC
MCH: 30.9 pg (ref 26.0–34.0)
MCHC: 35.6 g/dL (ref 30.0–36.0)
MCV: 86.8 fL (ref 78.0–100.0)
Platelets: 292 10*3/uL (ref 150–400)
Platelets: 317 10*3/uL (ref 150–400)
RBC: 5.2 MIL/uL (ref 4.22–5.81)
RDW: 12.6 % (ref 11.5–15.5)
RDW: 13 % (ref 11.5–15.5)
WBC: 9.2 10*3/uL (ref 4.0–10.5)

## 2012-03-03 LAB — COMPREHENSIVE METABOLIC PANEL
ALT: 11 U/L (ref 0–53)
AST: 16 U/L (ref 0–37)
Alkaline Phosphatase: 69 U/L (ref 39–117)
CO2: 26 mEq/L (ref 19–32)
Calcium: 9.9 mg/dL (ref 8.4–10.5)
GFR calc Af Amer: >90 mL/min (ref >90)
Glucose, Bld: 113 mg/dL — ABNORMAL HIGH (ref 70–99)
Potassium: 4.5 mEq/L (ref 3.5–5.1)
Sodium: 131 mEq/L — ABNORMAL LOW (ref 135–145)
Total Protein: 7.5 g/dL (ref 6.0–8.3)

## 2012-03-03 LAB — DIFFERENTIAL
Basophils Absolute: 0 10*3/uL (ref 0.0–0.1)
Basophils Relative: 0 % (ref 0–1)
Eosinophils Absolute: 0.1 10*3/uL (ref 0.0–0.7)
Eosinophils Absolute: 0.1 10*3/uL (ref 0.0–0.7)
Eosinophils Relative: 1 % (ref 0–5)
Eosinophils Relative: 1 % (ref 0–5)
Lymphocytes Relative: 37 % (ref 12–46)
Lymphs Abs: 3.4 10*3/uL (ref 0.7–4.0)
Neutrophils Relative %: 51 % (ref 43–77)
Neutrophils Relative %: 66 % (ref 43–77)

## 2012-03-03 LAB — URINALYSIS, ROUTINE W REFLEX MICROSCOPIC
Bilirubin Urine: NEGATIVE
Hgb urine dipstick: NEGATIVE
Ketones, ur: NEGATIVE mg/dL
Nitrite: NEGATIVE
Specific Gravity, Urine: 1.006 (ref 1.005–1.030)
pH: 7 (ref 5.0–8.0)

## 2012-03-03 LAB — HEPATIC FUNCTION PANEL
ALT: 15 U/L (ref 0–53)
Alkaline Phosphatase: 70 U/L (ref 39–117)
Bilirubin, Direct: <0.1 mg/dL (ref 0.0–0.3)
Total Bilirubin: 0.5 mg/dL (ref 0.3–1.2)

## 2012-03-03 MED ORDER — ONDANSETRON HCL 4 MG/2ML IJ SOLN: 4.0000 mg | Freq: Four times a day (QID) | INTRAMUSCULAR | Status: DC | PRN

## 2012-03-03 MED ORDER — ENOXAPARIN SODIUM 40 MG/0.4ML ~~LOC~~ SOLN
40.0000 mg | Freq: Every day | SUBCUTANEOUS | Status: DC
  Administered 2012-03-03 – 2012-03-05 (×3): 40 mg via SUBCUTANEOUS
  Filled 2012-03-03 (×4): qty 0.4

## 2012-03-03 MED ORDER — ATORVASTATIN CALCIUM 40 MG PO TABS
40.0000 mg | ORAL_TABLET | Freq: Every day | ORAL | Status: DC
  Administered 2012-03-04 – 2012-03-05 (×2): 40 mg via ORAL
  Filled 2012-03-03 (×3): qty 1

## 2012-03-03 MED ORDER — ALUM & MAG HYDROXIDE-SIMETH 200-200-20 MG/5ML PO SUSP: 30.0000 mL | Freq: Four times a day (QID) | ORAL | Status: DC | PRN

## 2012-03-03 MED ORDER — SODIUM CHLORIDE 0.9 % IV SOLN
1000.0000 mL | Freq: Once | INTRAVENOUS | Status: AC
  Administered 2012-03-03: 1000 mL via INTRAVENOUS

## 2012-03-03 MED ORDER — OMEPRAZOLE 20 MG PO CPDR: 20.0000 mg | DELAYED_RELEASE_CAPSULE | Freq: Every day | ORAL | Status: DC

## 2012-03-03 MED ORDER — HYDROCODONE-ACETAMINOPHEN 5-325 MG PO TABS: 1.0000 | ORAL_TABLET | ORAL | Status: DC | PRN

## 2012-03-03 MED ORDER — PANTOPRAZOLE SODIUM 40 MG IV SOLR
40.0000 mg | Freq: Two times a day (BID) | INTRAVENOUS | Status: DC
  Administered 2012-03-03 – 2012-03-05 (×5): 40 mg via INTRAVENOUS
  Filled 2012-03-03 (×7): qty 40

## 2012-03-03 MED ORDER — ZOLPIDEM TARTRATE 5 MG PO TABS
5.0000 mg | ORAL_TABLET | Freq: Every evening | ORAL | Status: DC | PRN
  Administered 2012-03-05: 5 mg via ORAL
  Filled 2012-03-03: qty 1

## 2012-03-03 MED ORDER — IOHEXOL 300 MG/ML  SOLN
100.0000 mL | Freq: Once | INTRAMUSCULAR | Status: AC | PRN
  Administered 2012-03-03: 100 mL via INTRAVENOUS

## 2012-03-03 MED ORDER — ACETAMINOPHEN 325 MG PO TABS: 650.0000 mg | ORAL_TABLET | Freq: Four times a day (QID) | ORAL | Status: DC | PRN

## 2012-03-03 MED ORDER — ENOXAPARIN SODIUM 40 MG/0.4ML ~~LOC~~ SOLN
40.0000 mg | SUBCUTANEOUS | Status: DC
  Filled 2012-03-03: qty 0.4

## 2012-03-03 MED ORDER — ONDANSETRON HCL 4 MG PO TABS: 4.0000 mg | ORAL_TABLET | Freq: Four times a day (QID) | ORAL | Status: DC | PRN

## 2012-03-03 MED ORDER — NICOTINE 21 MG/24HR TD PT24
21.0000 mg | MEDICATED_PATCH | Freq: Every day | TRANSDERMAL | Status: DC
  Administered 2012-03-03 – 2012-03-05 (×3): 21 mg via TRANSDERMAL
  Filled 2012-03-03 (×4): qty 1

## 2012-03-03 MED ORDER — AMOXICILLIN 500 MG PO CAPS: 1000.0000 mg | ORAL_CAPSULE | Freq: Two times a day (BID) | ORAL | Status: DC

## 2012-03-03 MED ORDER — NICOTINE 21 MG/24HR TD PT24: 21.0000 mg | MEDICATED_PATCH | Freq: Once | TRANSDERMAL | Status: DC

## 2012-03-03 MED ORDER — ACETAMINOPHEN 650 MG RE SUPP: 650.0000 mg | Freq: Four times a day (QID) | RECTAL | Status: DC | PRN

## 2012-03-03 MED ORDER — SODIUM CHLORIDE 0.9 % IV SOLN
1000.0000 mL | INTRAVENOUS | Status: DC
  Administered 2012-03-03: 1000 mL via INTRAVENOUS

## 2012-03-03 MED ORDER — INSULIN ASPART 100 UNIT/ML ~~LOC~~ SOLN
0.0000 [IU] | SUBCUTANEOUS | Status: DC
  Administered 2012-03-04: 3 [IU] via SUBCUTANEOUS
  Administered 2012-03-05 (×3): 1 [IU] via SUBCUTANEOUS
  Administered 2012-03-06: 2 [IU] via SUBCUTANEOUS
  Administered 2012-03-06: 1 [IU] via SUBCUTANEOUS

## 2012-03-03 MED ORDER — SODIUM CHLORIDE 0.9 % IV SOLN
INTRAVENOUS | Status: DC
  Administered 2012-03-03 – 2012-03-04 (×2): via INTRAVENOUS
  Administered 2012-03-04: 100 mL via INTRAVENOUS
  Administered 2012-03-05: via INTRAVENOUS

## 2012-03-03 MED ORDER — CLARITHROMYCIN 500 MG PO TABS: 500.0000 mg | ORAL_TABLET | Freq: Two times a day (BID) | ORAL | Status: DC

## 2012-03-03 MED ORDER — HYDROMORPHONE HCL PF 1 MG/ML IJ SOLN
0.5000 mg | INTRAMUSCULAR | Status: DC | PRN
  Administered 2012-03-05 – 2012-03-06 (×3): 1 mg via INTRAVENOUS
  Filled 2012-03-03 (×3): qty 1

## 2012-03-03 MED ORDER — ONDANSETRON HCL 4 MG/2ML IJ SOLN: 4.0000 mg | Freq: Once | INTRAMUSCULAR | Status: DC

## 2012-03-03 MED ORDER — OXYCODONE HCL 5 MG PO TABS
5.0000 mg | ORAL_TABLET | ORAL | Status: DC | PRN
  Administered 2012-03-05: 5 mg via ORAL
  Filled 2012-03-03: qty 1

## 2012-03-03 NOTE — ED Notes (Signed)
Pt given rx x 4, discharge and follow up instructions without further questions after speaking with MD. Pt denies further needs at this time. Ambulates to lobby in NAD with family.

## 2012-03-03 NOTE — H&P (Signed)
DATE OF ADMISSION:  03/03/2012  PCP:  [facility] in [place]  [place], [place], [place]   Chief Complaint: ABD Pain   HPI: [name] [name] is an 66 y.o. male who presents to the ED with complaints of 1 week of severe epigastric area ABD pain, and nausea.  He denies andy vomiting or diarrhea. He has had a decreased appetite due to the pain.  He also denies having any fevers or chills.  He was seen at the [facility] and ED  1 day ago and was sent for a CT scan of the ABD/Pelvis which returned with findings of Pancreatitis and Gallstones.  His lipase level was elevated as well.  He returned to the ED due to continued symptoms, and he was evaluated in the ED by General Surgery Dr. Dwain Sarna and plans were made for admission for his pancreatitis and later plans for possible  Gall bladder surgery after the pancreatitis has resolved.     Past Medical History  Diagnosis Date  . Diabetes mellitus   . Hypertension   . Hypercholesteremia     Past Surgical History  Procedure Date  . Hernia repair     Medications:  HOME MEDS: Prior to Admission medications   Medication Sig Start Date End Date Taking? Authorizing Provider  ALPHA LIPOIC ACID PO Take 100 mg by mouth 2 (two) times daily.   Yes Historical Provider, MD  aspirin EC 81 MG tablet Take 81 mg by mouth daily.     Yes Historical Provider, MD  Cholecalciferol (VITAMIN D-3 PO) Take 2 tablets by mouth daily.   Yes Historical Provider, MD  glipiZIDE (GLUCOTROL XL) 2.5 MG 24 hr tablet Take 2.5 mg by mouth daily.     Yes Historical Provider, MD  hydrochlorothiazide (HYDRODIURIL) 25 MG tablet Take 25 mg by mouth daily.     Yes Historical Provider, MD  metFORMIN (GLUCOPHAGE) 500 MG tablet Take 500 mg by mouth 2 (two) times daily with a meal.     Yes Historical Provider, MD  niacin (NIASPAN) 500 MG CR tablet Take 1,000 mg by mouth at bedtime.     Yes Historical Provider, MD  Omega-3 Fatty Acids (FISH OIL PO) Take 4 capsules by mouth 2 (two) times daily.     Yes  Historical Provider, MD  omeprazole-sodium bicarbonate (ZEGERID) 40-1100 MG per capsule Take 1 capsule by mouth daily before breakfast.   Yes Historical Provider, MD  ranitidine (ZANTAC) 150 MG tablet Take 150 mg by mouth 2 (two) times daily.   Yes Historical Provider, MD  simvastatin (ZOCOR) 80 MG tablet Take 40 mg by mouth at bedtime.     Yes Historical Provider, MD    Allergies:  Allergies  Allergen Reactions  . Neosporin (Neomycin-Polymyxin-Gramicidin) Swelling and Rash    Social History: Smokes 1 ppd X 40 years  reports that he has been smoking.  He has never used smokeless tobacco. He reports that he does not drink alcohol or use illicit drugs.  Family History: History reviewed. No pertinent family history.  Review of Systems:  The patient denies fever, weight loss, vision loss, decreased hearing, hoarseness, chest pain, syncope, dyspnea on exertion, peripheral edema, balance deficits, hemoptysis, melena, hematochezia, severe indigestion/heartburn, hematuria, incontinence, genital sores, muscle weakness, suspicious skin lesions, transient blindness, difficulty walking, depression, unusual weight change, abnormal bleeding, enlarged lymph nodes, angioedema, and breast masses.   Physical Exam:  GEN:  Pleasant well nourished and well developed 66 year old Caucasian male examined  and in no acute  distress; cooperative with exam Filed Vitals:   03/03/12 1517 03/03/12 1917  BP: 156/79 143/79  Pulse: 99 70  Temp: 97.6 F (36.4 C) 97.4 F (36.3 C)  TempSrc:  Oral  Resp: 18 18  SpO2: 98% 97%   Blood pressure 143/79, pulse 70, temperature 97.4 F (36.3 C), temperature source Oral, resp. rate 18, SpO2 97.00%. PSYCH: He is alert and oriented x4; does not appear anxious does not appear depressed; affect is normal HEENT: Normocephalic and Atraumatic, Mucous membranes pink; PERRLA; EOM intact; Fundi:  Benign;  No scleral icterus, Nares: Patent, Oropharynx: Clear, Edentulous Upper palate.   Neck:  FROM, no cervical lymphadenopathy nor thyromegaly or carotid bruit; no JVD; Breasts:: Not examined CHEST WALL: No tenderness CHEST: Normal respiration, clear to auscultation bilaterally HEART: Regular rate and rhythm; no murmurs rubs or gallops BACK: No kyphosis or scoliosis; no CVA tenderness ABDOMEN: Positive Bowel Sounds, soft non-tender; no masses, no organomegaly,  Rectal Exam: Not done EXTREMITIES: No bone or joint deformity; age-appropriate arthropathy of the hands and knees; no cyanosis, clubbing or edema; no ulcerations. Genitalia: not examined PULSES: 2+ and symmetric SKIN: Normal hydration no rash or ulceration CNS: Cranial nerves 2-12 grossly intact no focal neurologic deficit   Labs & Imaging Results for orders placed during the hospital encounter of 03/03/12 (from the past 48 hour(s))  CBC     Status: Normal   Collection Time   03/03/12  4:57 PM      Component Value Range Comment   WBC 9.2  4.0 - 10.5 K/uL    RBC 5.20  4.22 - 5.81 MIL/uL    Hemoglobin 15.8  13.0 - 17.0 g/dL    HCT 40.9  81.1 - 91.4 %    MCV 85.6  78.0 - 100.0 fL    MCH 30.4  26.0 - 34.0 pg    MCHC 35.5  30.0 - 36.0 g/dL    RDW 78.2  95.6 - 21.3 %    Platelets 292  150 - 400 K/uL   DIFFERENTIAL     Status: Normal   Collection Time   03/03/12  4:57 PM      Component Value Range Comment   Neutrophils Relative 51  43 - 77 %    Neutro Abs 4.7  1.7 - 7.7 K/uL    Lymphocytes Relative 37  12 - 46 %    Lymphs Abs 3.4  0.7 - 4.0 K/uL    Monocytes Relative 10  3 - 12 %    Monocytes Absolute 0.9  0.1 - 1.0 K/uL    Eosinophils Relative 1  0 - 5 %    Eosinophils Absolute 0.1  0.0 - 0.7 K/uL    Basophils Relative 0  0 - 1 %    Basophils Absolute 0.0  0.0 - 0.1 K/uL   COMPREHENSIVE METABOLIC PANEL     Status: Abnormal   Collection Time   03/03/12  4:57 PM      Component Value Range Comment   Sodium 131 (*) 135 - 145 mEq/L    Potassium 4.5  3.5 - 5.1 mEq/L    Chloride 92 (*) 96 - 112 mEq/L    CO2 26   19 - 32 mEq/L    Glucose, Bld 113 (*) 70 - 99 mg/dL    BUN 7  6 - 23 mg/dL    Creatinine, Ser 0.86  0.50 - 1.35 mg/dL    Calcium 9.9  8.4 - 57.8 mg/dL    Total Protein  7.5  6.0 - 8.3 g/dL    Albumin 4.4  3.5 - 5.2 g/dL    AST 16  0 - 37 U/L    ALT 11  0 - 53 U/L    Alkaline Phosphatase 69  39 - 117 U/L    Total Bilirubin 0.5  0.3 - 1.2 mg/dL    GFR calc non Af Amer >90  >90 mL/min    GFR calc Af Amer >90  >90 mL/min   LIPASE, BLOOD     Status: Abnormal   Collection Time   03/03/12  4:57 PM      Component Value Range Comment   Lipase 143 (*) 11 - 59 U/L   URINALYSIS, ROUTINE W REFLEX MICROSCOPIC     Status: Normal   Collection Time   03/03/12  5:02 PM      Component Value Range Comment   Color, Urine YELLOW  YELLOW    APPearance CLEAR  CLEAR    Specific Gravity, Urine 1.006  1.005 - 1.030    pH 7.0  5.0 - 8.0    Glucose, UA NEGATIVE  NEGATIVE mg/dL    Hgb urine dipstick NEGATIVE  NEGATIVE    Bilirubin Urine NEGATIVE  NEGATIVE    Ketones, ur NEGATIVE  NEGATIVE mg/dL    Protein, ur NEGATIVE  NEGATIVE mg/dL    Urobilinogen, UA 0.2  0.0 - 1.0 mg/dL    Nitrite NEGATIVE  NEGATIVE    Leukocytes, UA NEGATIVE  NEGATIVE MICROSCOPIC NOT DONE ON URINES WITH NEGATIVE PROTEIN, BLOOD, LEUKOCYTES, NITRITE, OR GLUCOSE <1000 mg/dL.   Ct Abdomen Pelvis W Contrast  03/03/2012  *RADIOLOGY REPORT*  Clinical Data: Cholelithiasis.  Possible pancreatitis.  CT ABDOMEN AND PELVIS WITH CONTRAST  Technique:  Multidetector CT imaging of the abdomen and pelvis was performed following the standard protocol during bolus administration of intravenous contrast.  Contrast: OMNIPAQUE IOHEXOL 300 MG/ML  SOLN  Comparison: Ultrasound of 03/02/2012 [facility]).  Findings: Incompletely imaged left lower lobe lung nodule which measures 5 mm on image 1. Normal heart size without pericardial or pleural effusion.  Right coronary artery disease.  Small hiatal hernia.  Normal liver, spleen.  Underdistended proximal  stomach.  The pancreatic body and tail are unremarkable.  There is subtle soft tissue fullness within the pancreatic head/uncinate process with surrounding edema.  Example image 31 of series 2.  This is presumably related to the clinical history pancreatitis.  No common duct dilatation. Pancreatic duct upper normal in region of pancreatic body, including on coronal image 42.  Cholelithiasis without acute cholecystitis.  No vascular complication.  No peripancreatic fluid collection.  Normal right adrenal gland.  A left adrenal nodule measures 1.2 cm and is indeterminate.  Normal kidneys.  Mild perirenal edema is symmetric and nonspecific. Mild non aneurysmal dilatation of the infrarenal aorta; 2.6 cm.  No surrounding hemorrhage.  Iliac atherosclerosis which is advanced for age. No retroperitoneal or retrocrural adenopathy.  The rectosigmoid appears thick-walled on image 57, felt to be due to underdistension.  Separate area of apparent descending/sigmoid junction wall thickening image 59.  Normal appendix. Normal small bowel without abdominal ascites.  Fat containing left inguinal hernia. No pelvic adenopathy. Normal urinary bladder and prostate.  No significant free fluid. Scattered tiny sclerotic lesions which are likely bone islands.  IMPRESSION:  1.  Subtle soft tissue fullness of and edema surrounding the pancreatic head/uncinate process.  This is most likely related to pancreatitis, given clinical history and elevated pancreatic enzymes.  Given  the borderline dilatation of pancreatic duct, consider follow-up with pre post contrast abdominal MRI when the patient is improved from the acute symptoms to exclude unlikely underlying obstructive pancreatic head mass. 2.  Cholelithiasis without acute cholecystitis or biliary ductal dilatation. 3.  Advanced atherosclerosis, including within the coronary arteries.  Non aneurysmal aortic dilatation. 4.  Foci of apparent colonic wall thickening.  Favored to be due to  underdistension.  If the patient has not undergone recent colonoscopy, this should be considered with attention to these areas. 5.  Left lower lobe lung nodule. If the patient is at high risk for bronchogenic carcinoma, follow-up chest CT at 6-12 months is recommended.  If the patient is at low risk for bronchogenic carcinoma, follow-up chest CT at 12 months is recommended.  This recommendation follows the consensus statement: "Guidelines for Management of Small Pulmonary Nodules Detected on CT Scans: A Statement from the Fleischner Society" as published in Radiology 2005; 237:395-400. Online at: DietDisorder.cz. 6.  Left adrenal nodule which is indeterminate.  This could also be evaluated on follow-up MRI.  Original Report Authenticated By: Consuello Bossier, M.D.      Assessment:  Present on Admission:  .Acute pancreatitis .Gallstones .Abdominal pain .DM (diabetes mellitus) .Hypertension .Hyperlipidemia .Tobacco use disorder .Hyponatremia   GERD    Plan:    Admit to Med/Surg Bed.   Placed on Clear liqiud Diet, and IVFs, Bowel Rest, IV Protonix Pain control PRN SSI coverage, Hold Metformin for now.   Home medications reconciled Tobacco Cessation counselling, and Nicotine Patch ordered Monitor Electrolyes, should correct with IVFs DVT Prophylaxis Other plans as per orders.    CODE STATUS:      FULL CODE       Aysiah Jurado C 03/03/2012, 8:22 PM

## 2012-03-03 NOTE — ED Notes (Signed)
Pt returns from CT.

## 2012-03-03 NOTE — ED Notes (Signed)
Pt in last night for similar symptoms, per [place] the pt was told that he had an enlarged pancreas & gallstone, pt also + of H pylori, pt discharged home & instructed to call & make apt with Dr. Gaynelle Adu with general surgery & MRI, when daughter called today the pt was told to return to ED for MRI, pt frustrated that he was discharged

## 2012-03-03 NOTE — ED Notes (Signed)
Pt to CT with tech.

## 2012-03-03 NOTE — Telephone Encounter (Signed)
[facility] CALLED RE APPT FOR MR [name] [name]. NOT SURE HOW SOON HE NEEDED TO BE SEEN. HE HAD BEEN SEEN IN [place] LAST NIGHT AND DISCHARGED WITH INSTRUCTIONS TO SEE DR. Andrey Campanile. I REVIEWED ER RECORDS AND TESTING WITH DR. Luisa Hart. PT ALSO HAS MEDICAL ISSUES ACCORDING TO RESULTS. I SPOKE WITH [name] [name]/PT'S WIFE AND SHE INDICATED HE IS STILL HAVING PAIN EVEN WHEN DRINKING LIQUIDS. PT HAS NO MEDICAL DR. HERE AND CANNOT BE SEEN BY [facility] DR[facility] UNTIL July. PAIN IS SUBSTERNAL WITH RADIATION TO ABDOMEN. DR. Luisa Hart INDICATED PT SHOULD RETURN TO [place] FOR GENERAL MEDICINE ADMISSION AND WORK-UP. PT'S WIFE NOTIFIED AND I ALSO NOTIFIED [name] [name]/ CHARGE NURSE AT [place]./GY/ SEE ER NOTES FOR DETAILS.

## 2012-03-03 NOTE — Consult Note (Signed)
Reason for Consult:abdominal pain Referring Physician: Dr. Linwood Dibbles  [name] [name] is an 66 y.o. male.  HPI: 78 yom who has history of ulcer disease 20 years ago who has baseline GERD. About one week ago he has noted the onset of significant epigastric pain.  He has nausea and no vomiting.  Nothing he has done has helped.  This is worse with eating and drinking. He underwent evaluation at urgent care near his home on Tuesday with a mildly elevated lipase and wbc of 11.  An u/s the following day showed gallstones as well as edema of pancreas.  He continued to have same and he presented last night with epigastric pain and elevated lipase.  A ct again confirmed that he has edema around head of pancreas.  He was sent home last night and given outpt referral to our office.  He returns today with same symptoms although somewhat better.  Past Medical History  Diagnosis Date  . Diabetes mellitus   . Hypertension   . Hypercholesteremia     Past Surgical History  Procedure Date  . Hernia repair     History reviewed. No pertinent family history.  Social History:  reports that he has been smoking.  He has never used smokeless tobacco. He reports that he does not drink alcohol or use illicit drugs.  Allergies:  Allergies  Allergen Reactions  . Neosporin (Neomycin-Polymyxin-Gramicidin) Swelling and Rash    Medications: I have reviewed the patient's current medications.  Results for orders placed during the [name] encounter of 03/03/12 (from the past 48 hour(s))  CBC     Status: Normal   Collection Time   03/03/12  4:57 PM      Component Value Range Comment   WBC 9.2  4.0 - 10.5 K/uL    RBC 5.20  4.22 - 5.81 MIL/uL    Hemoglobin 15.8  13.0 - 17.0 g/dL    HCT 78.2  95.6 - 21.3 %    MCV 85.6  78.0 - 100.0 fL    MCH 30.4  26.0 - 34.0 pg    MCHC 35.5  30.0 - 36.0 g/dL    RDW 08.6  57.8 - 46.9 %    Platelets 292  150 - 400 K/uL   DIFFERENTIAL     Status: Normal   Collection Time   03/03/12   4:57 PM      Component Value Range Comment   Neutrophils Relative 51  43 - 77 %    Neutro Abs 4.7  1.7 - 7.7 K/uL    Lymphocytes Relative 37  12 - 46 %    Lymphs Abs 3.4  0.7 - 4.0 K/uL    Monocytes Relative 10  3 - 12 %    Monocytes Absolute 0.9  0.1 - 1.0 K/uL    Eosinophils Relative 1  0 - 5 %    Eosinophils Absolute 0.1  0.0 - 0.7 K/uL    Basophils Relative 0  0 - 1 %    Basophils Absolute 0.0  0.0 - 0.1 K/uL   COMPREHENSIVE METABOLIC PANEL     Status: Abnormal   Collection Time   03/03/12  4:57 PM      Component Value Range Comment   Sodium 131 (*) 135 - 145 mEq/L    Potassium 4.5  3.5 - 5.1 mEq/L    Chloride 92 (*) 96 - 112 mEq/L    CO2 26  19 - 32 mEq/L    Glucose, Bld 113 (*)  70 - 99 mg/dL    BUN 7  6 - 23 mg/dL    Creatinine, Ser 1.61  0.50 - 1.35 mg/dL    Calcium 9.9  8.4 - 09.6 mg/dL    Total Protein 7.5  6.0 - 8.3 g/dL    Albumin 4.4  3.5 - 5.2 g/dL    AST 16  0 - 37 U/L    ALT 11  0 - 53 U/L    Alkaline Phosphatase 69  39 - 117 U/L    Total Bilirubin 0.5  0.3 - 1.2 mg/dL    GFR calc non Af Amer >90  >90 mL/min    GFR calc Af Amer >90  >90 mL/min   LIPASE, BLOOD     Status: Abnormal   Collection Time   03/03/12  4:57 PM      Component Value Range Comment   Lipase 143 (*) 11 - 59 U/L   URINALYSIS, ROUTINE W REFLEX MICROSCOPIC     Status: Normal   Collection Time   03/03/12  5:02 PM      Component Value Range Comment   Color, Urine YELLOW  YELLOW    APPearance CLEAR  CLEAR    Specific Gravity, Urine 1.006  1.005 - 1.030    pH 7.0  5.0 - 8.0    Glucose, UA NEGATIVE  NEGATIVE mg/dL    Hgb urine dipstick NEGATIVE  NEGATIVE    Bilirubin Urine NEGATIVE  NEGATIVE    Ketones, ur NEGATIVE  NEGATIVE mg/dL    Protein, ur NEGATIVE  NEGATIVE mg/dL    Urobilinogen, UA 0.2  0.0 - 1.0 mg/dL    Nitrite NEGATIVE  NEGATIVE    Leukocytes, UA NEGATIVE  NEGATIVE MICROSCOPIC NOT DONE ON URINES WITH NEGATIVE PROTEIN, BLOOD, LEUKOCYTES, NITRITE, OR GLUCOSE <1000 mg/dL.    Ct  Abdomen Pelvis W Contrast  03/03/2012  *RADIOLOGY REPORT*  Clinical Data: Cholelithiasis.  Possible pancreatitis.  CT ABDOMEN AND PELVIS WITH CONTRAST  Technique:  Multidetector CT imaging of the abdomen and pelvis was performed following the standard protocol during bolus administration of intravenous contrast.  Contrast: OMNIPAQUE IOHEXOL 300 MG/ML  SOLN  Comparison: Ultrasound of 03/02/2012 [name] [name]).  Findings: Incompletely imaged left lower lobe lung nodule which measures 5 mm on image 1. Normal heart size without pericardial or pleural effusion.  Right coronary artery disease.  Small hiatal hernia.  Normal liver, spleen.  Underdistended proximal stomach.  The pancreatic body and tail are unremarkable.  There is subtle soft tissue fullness within the pancreatic head/uncinate process with surrounding edema.  Example image 31 of series 2.  This is presumably related to the clinical history pancreatitis.  No common duct dilatation. Pancreatic duct upper normal in region of pancreatic body, including on coronal image 42.  Cholelithiasis without acute cholecystitis.  No vascular complication.  No peripancreatic fluid collection.  Normal right adrenal gland.  A left adrenal nodule measures 1.2 cm and is indeterminate.  Normal kidneys.  Mild perirenal edema is symmetric and nonspecific. Mild non aneurysmal dilatation of the infrarenal aorta; 2.6 cm.  No surrounding hemorrhage.  Iliac atherosclerosis which is advanced for age. No retroperitoneal or retrocrural adenopathy.  The rectosigmoid appears thick-walled on image 57, felt to be due to underdistension.  Separate area of apparent descending/sigmoid junction wall thickening image 59.  Normal appendix. Normal small bowel without abdominal ascites.  Fat containing left inguinal hernia. No pelvic adenopathy. Normal urinary bladder and prostate.  No significant free fluid. Scattered tiny  sclerotic lesions which are likely bone islands.  IMPRESSION:  1.   Subtle soft tissue fullness of and edema surrounding the pancreatic head/uncinate process.  This is most likely related to pancreatitis, given clinical history and elevated pancreatic enzymes.  Given the borderline dilatation of pancreatic duct, consider follow-up with pre post contrast abdominal MRI when the patient is improved from the acute symptoms to exclude unlikely underlying obstructive pancreatic head mass. 2.  Cholelithiasis without acute cholecystitis or biliary ductal dilatation. 3.  Advanced atherosclerosis, including within the coronary arteries.  Non aneurysmal aortic dilatation. 4.  Foci of apparent colonic wall thickening.  Favored to be due to underdistension.  If the patient has not undergone recent colonoscopy, this should be considered with attention to these areas. 5.  Left lower lobe lung nodule. If the patient is at high risk for bronchogenic carcinoma, follow-up chest CT at 6-12 months is recommended.  If the patient is at low risk for bronchogenic carcinoma, follow-up chest CT at 12 months is recommended.  This recommendation follows the consensus statement: "Guidelines for Management of Small Pulmonary Nodules Detected on CT Scans: A Statement from the Fleischner Society" as published in Radiology 2005; 237:395-400. Online at: DietDisorder.cz. 6.  Left adrenal nodule which is indeterminate.  This could also be evaluated on follow-up MRI.  Original Report Authenticated By: Consuello Bossier, M.D.    Review of Systems  Constitutional: Negative for weight loss.  Respiratory: Negative for shortness of breath.   Cardiovascular: Negative for chest pain.  Gastrointestinal: Positive for heartburn, nausea and abdominal pain. Negative for vomiting, diarrhea, constipation and blood in stool.   Blood pressure 156/79, pulse 99, temperature 97.6 F (36.4 C), resp. rate 18, SpO2 98.00%. Physical Exam  Vitals reviewed. Constitutional: He appears  well-developed and well-nourished.  Eyes: No scleral icterus.  Cardiovascular: Normal rate, regular rhythm and normal heart sounds.   Respiratory: Effort normal and breath sounds normal. He has no wheezes. He has no rales.  GI: Soft. Bowel sounds are normal. There is tenderness in the epigastric area. There is negative Murphy's sign. A hernia (reducible nontender umbilical hernia) is present.    Assessment/Plan: Likely gallstone pancreatitis  I think he has gallstone pancreatitis.  He doesn't really sound like he has tumor as source and I don't think his ct merits more evaluation necessarily as clinically by his exam and labs he appears to have pancreatitis. I don't think this is due to his h pylori or ulcer disease either.  I recommended admission by medical team, make npo until his pain completely resolves.  Recheck labs.  Will plan on performing lap chole as discussed with patient prior to his discharge.  This may possibly be as early as Sunday.  Rudie Rikard 03/03/2012, 7:00 PM

## 2012-03-03 NOTE — ED Notes (Signed)
Admit MD at bedside

## 2012-03-03 NOTE — ED Notes (Signed)
Pt reports he was seen in [place] last night for abd pain and bloating. Denies n/v/d. Reports he was diagnosed with gallstones, enlarged pancreas, and h pylori and sent home to follow up with surgeon today. Pt called [facility] at [facility] today and the pt was instructed to come back to ed for admission. A&Ox4, resp e/u

## 2012-03-03 NOTE — Discharge Instructions (Signed)
Followup with Dr. Andrey Campanile first available appointment. Tell your doctor you need an MRI of your pancreas to better understand the cause of swelling.  You also have a lesion in your lung. This is quite common, but must be reimaged later to ensure it is a non-concerning finding.    There can be a drug interaction between the medicines clarithromycin and simvastatin.  Let your doctor know if you feel weak or you have pain under your right ribs .  See your doctor immediately--or return to the ED--with any new or troubling symptoms including fevers, weakness, new chest pain, shortness or breath, numbness, or any other concerning symptom.    Acute Pancreatitis  The pancreas is a large gland located behind your stomach. It produces (secretes) enzymes. These enzymes help digest food. It also releases the hormones glucagon and insulin. These hormones help regulate blood sugar. When the pancreas becomes inflamed, the disease is called pancreatitis. Inflammation of the pancreas occurs when enzymes from the pancreas begin attacking and digesting the pancreas. CAUSES  Most cases ofsudden onset (acute) pancreatitis are caused by:  Alcohol abuse.   Gallstones.  Other less common causes are:  Some medications.   Exposure to certain chemicals   Infection.   Damage caused by an accident (trauma).   Surgery of the belly (abdomen).  SYMPTOMS  Acute pancreatitis usually begins with pain in the upper abdomen and may radiate to the back. This pain may last a couple days. The constant pain varies from mild to severe. The acute form of this disease may vary from mild, nonspecific abdominal pain to profound shock with coma. About 1 in 5 cases are severe. These patients become dehydrated and develop low blood pressure. In severe cases, bleeding into the pancreas can lead to shock and death. The lungs, heart, and kidneys may fail. DIAGNOSIS  Your caregiver will form a clinical opinion after giving you an exam.  Laboratory work is used to confirm this diagnosis. Often,a digestive enzyme from the pancreas (serum amylase) and other enzymes are elevated. Sugars and fats (lipids) in the blood may be elevated. There may also be changes in the following levels: calcium, magnesium, potassium, chloride and bicarbonate (chemicals in the blood). X-rays, a CT scan, or ultrasound of your abdomen may be necessary to search for other causes of your abdominal pain. TREATMENT  Most pancreatitis requires treatment of symptoms. Most acute attacks last a couple of days. Your caregiver can discuss the treatment options with you.  If complications occur, hospitalization may be necessary for pain control and intravenous (IV) fluid replacement.   Sometimes, a tube may be put into the stomach to control vomiting.   Food may not be allowed for 3 to 4 days. This gives the pancreas time to rest. Giving the pancreas a rest means there is no stimulation that would produce more enzymes and cause more damage.   Medicines (antibiotics) that kill germs may be given if infection is the cause.   Sometimes, surgery may be required.   Following an acute attack, your caregiver will determine the cause, if possible, and offer suggestions to prevent recurrences.  HOME CARE INSTRUCTIONS   Eat smaller, more frequent meals. This reduces the amount of digestive juices the pancreas produces.   Decrease the amount of fat in your diet. This may help reduce loose, diarrheal stools.   Drink enough water and fluids to keep your urine clear or pale yellow. This is to avoid dehydration which can cause increased pain.  Talk to your caregiver about pain relievers or other medicines that may help.   Avoid anything that may have triggered your pancreatitis (for example, alcohol).   Follow the diet advised by your caregiver. Do not advance the diet too soon.   Take medicines as prescribed.   Get plenty of rest.   Check your blood sugar at home as  directed by your caregiver.   If your caregiver has given you a follow-up appointment, it is very important to keep that appointment. Not keeping the appointment could result in a lasting (chronic) or permanent injury, pain, and disability. If there is any problem keeping the appointment, you must call to reschedule.  SEEK MEDICAL CARE IF:   You are not recovering in the time described by your caregiver.   You have persistent pain, weakness, or feel sick to your stomach (nauseous).   You have recovered and then have another bout of pain.  SEEK IMMEDIATE MEDICAL CARE IF:   You are unable to eat or keep fluids down.   Your pain increases a lot or changes.   You have an oral temperature above 102 F (38.9 C), not controlled by medicine.   Your skin or the white part of your eyes look yellow (jaundice).   You develop vomiting.   You feel dizzy or faint.   Your blood sugar is high (over 300).  MAKE SURE YOU:   Understand these instructions.   Will watch your condition.   Will get help right away if you are not doing well or get worse.  Document Released: 09/06/2005 Document Revised: 08/26/2011 Document Reviewed: 04/19/2008 [facility] Patient Information 2012 [place], [place].Biliary Colic  Biliary colic is a steady or irregular pain in the upper abdomen. It is usually under the right side of the rib cage. It happens when gallstones interfere with the normal flow of bile from the gallbladder. Bile is a liquid that helps to digest fats. Bile is made in the liver and stored in the gallbladder. When you eat a meal, bile passes from the gallbladder through the cystic duct and the common bile duct into the small intestine. There, it mixes with partially digested food. If a gallstone blocks either of these ducts, the normal flow of bile is blocked. The muscle cells in the bile duct contract forcefully to try to move the stone. This causes the pain of biliary colic.  SYMPTOMS   A person with  biliary colic usually complains of pain in the upper abdomen. This pain can be:   In the center of the upper abdomen just below the breastbone.   In the upper-right part of the abdomen, near the gallbladder and liver.   Spread back toward the right shoulder blade.   Nausea and vomiting.   The pain usually occurs after eating.   Biliary colic is usually triggered by the digestive system's demand for bile. The demand for bile is high after fatty meals. Symptoms can also occur when a person who has been fasting suddenly eats a very large meal. Most episodes of biliary colic pass after 1 to 5 hours. After the most intense pain passes, your abdomen may continue to ache mildly for about 24 hours.  DIAGNOSIS  After you describe your symptoms, your caregiver will perform a physical exam. He or she will pay attention to the upper right portion of your belly (abdomen). This is the area of your liver and gallbladder. An ultrasound will help your caregiver look for gallstones. Specialized scans of  the gallbladder may also be done. Blood tests may be done, especially if you have fever or if your pain persists. PREVENTION  Biliary colic can be prevented by controlling the risk factors for gallstones. Some of these risk factors, such as heredity, increasing age, and pregnancy are a normal part of life. Obesity and a high-fat diet are risk factors you can change through a healthy lifestyle. Women going through menopause who take hormone replacement therapy (estrogen) are also more likely to develop biliary colic. TREATMENT   Pain medication may be prescribed.   You may be encouraged to eat a fat-free diet.   If the first episode of biliary colic is severe, or episodes of colic keep retuning, surgery to remove the gallbladder (cholecystectomy) is usually recommended. This procedure can be done through small incisions using an instrument called a laparoscope. The procedure often requires a brief stay in the  hospital. Some people can leave the hospital the same day. It is the most widely used treatment in people troubled by painful gallstones. It is effective and safe, with no complications in more than 90% of cases.   If surgery cannot be done, medication that dissolves gallstones may be used. This medication is expensive and can take months or years to work. Only small stones will dissolve.   Rarely, medication to dissolve gallstones is combined with a procedure called shock-wave lithotripsy. This procedure uses carefully aimed shock waves to break up gallstones. In many people treated with this procedure, gallstones form again within a few years.  PROGNOSIS  If gallstones block your cystic duct or common bile duct, you are at risk for repeated episodes of biliary colic. There is also a 25% chance that you will develop a gallbladder infection(acute cholecystitis), or some other complication of gallstones within 10 to 20 years. If you have surgery, schedule it at a time that is convenient for you and at a time when you are not sick. HOME CARE INSTRUCTIONS   Drink plenty of clear fluids.   Avoid fatty, greasy or fried foods, or any foods that make your pain worse.   Take medications as directed.  SEEK MEDICAL CARE IF:   You develop a fever over 100.5 F (38.1 C).   Your pain gets worse over time.   You develop nausea that prevents you from eating and drinking.   You develop vomiting.  SEEK IMMEDIATE MEDICAL CARE IF:   You have continuous or severe belly (abdominal) pain which is not relieved with medications.   You develop nausea and vomiting which is not relieved with medications.   You have symptoms of biliary colic and you suddenly develop a fever and shaking chills. This may signal cholecystitis. Call your caregiver immediately.   You develop a yellow color to your skin or the white part of your eyes (jaundice).  Document Released: 02/07/2006 Document Revised: 08/26/2011 Document  Reviewed: 04/18/2008 [facility] Patient Information 2012 [place], [place].  Peptic Ulcers Ulcers are small, open craters or sores that develop in the lining of the stomach or the duodenum (the first part of the small intestine). The term peptic ulcer is used to describe both types of ulcers. There are a number of treatments that relieve the discomfort of ulcers. In most cases ulcers do heal.  CAUSES AND COMMON FEATURES OF PEPTIC ULCERS  Peptic ulcers occur only in areas of the digestive system that come in contact with digestive juices. These juices are secreted (given off) by the stomach. They include acid and an  enzyme called pepsin that breaks down proteins. Many people with duodenal ulcers have too much digestive juice spilling down from the stomach. Most people with gastric (stomach) ulcers have normal or below normal amounts of stomach acid. Sometimes, when the mucous membrane (protective lining) of the stomach and duodenum does not protect well, this may add to the growth of ulcers. Duodenal ulcers often produces pain in a small area between the breastbone and navel. Pain varies from hunger pain to constant gnawing or burning sensations (feeling). Sometimes the pain is felt during sleep and may awaken the person in the middle of the night. Often the pain occurs two or three hours after eating, when the stomach is empty. Other common symptoms (problems) include overeating for pain relief. Eating relieves the pain of a duodenal ulcer. Gastric ulcer pain may be felt in the same place as the pain of a duodenal ulcer, or slightly higher up. There may also be sensations of feeling full, indigestion, and heartburn. Sometimes pain occurs when the stomach is full. This causes loss of appetite followed by weight loss. HOME CARE INSTRUCTIONS   Use of tobacco products have been found to slow down the healing of an ulcer. STOP SMOKING.   Avoid alcohol, aspirin, and other inflammation (swelling and soreness)  reducing drugs. These substances weaken the stomach lining.   Eat regular, nutritious meals.   Avoid foods that bother you.   Take medications and antacids as directed. Over-the-counter medications are used to neutralize stomach acid. Prescription medications reduce acid secretion, block acid production, or provide a protective coating over the ulcer. If a specific antacid was prescribed, do not switch brands without your caregiver's approval.  Surgery is usually not necessary. Diet and/or drug therapy usually is effective. Surgery may be necessary if perforation, obstruction due to scarring, or uncontrollable bleeding is found, or if severe pain is not otherwise controlled. SEEK IMMEDIATE MEDICAL CARE IF:  You see signs of bleeding. This includes vomiting fresh, bright red blood or passing bloody or tarry, black stools.   You suffer weakness, fatigue, or loss of consciousness. These symptoms can result from severe hemorrhaging (bleeding). Shock may result.   You have sudden, intense, severe abdominal (belly) pain. This is the first sign of a perforation. This would require immediate surgical treatment.   You have intense pain and continued vomiting. This could signal an obstruction of the digestive tract.  Document Released: 09/03/2000 Document Revised: 08/26/2011 Document Reviewed: 09/02/2008 [facility] Patient Information 2012 [place], [place].

## 2012-03-03 NOTE — ED Provider Notes (Signed)
History     CSN: [id number] Arrival date & time 03/03/12  1511  First MD Initiated Contact with Patient 03/03/12 1637     Chief Complaint  Patient presents with  . Abdominal Pain   HPI The patient presents to the ED for abdominal pain.  The patient returns to the ED after having been discharged last evening for the same issue.  As instructed, he called his surgeon today who told him that he needed to be admitted to the hospital for general work up and that he should return to the ED.  He ate less fat today and took an omeprazole this morning which seems to have decreased his pain from a 10 to a 5.  He still describes the pain as burning, radiating from his epigastrium to both sides of his upper abdomen, and constant.   Past Medical History  Diagnosis Date  . Diabetes mellitus   . Hypertension   . Hypercholesteremia     Past Surgical History  Procedure Date  . Hernia repair     History reviewed. No pertinent family history.  History  Substance Use Topics  . Smoking status: Current Everyday Smoker  . Smokeless tobacco: Never Used  . Alcohol Use: No      Review of Systems  Constitutional: Negative for fever and chills.  Respiratory: Negative for cough and shortness of breath.   Cardiovascular: Negative for chest pain and palpitations.  Gastrointestinal: Positive for nausea and abdominal pain. Negative for vomiting, diarrhea and blood in stool.  Genitourinary: Negative for dysuria.  Neurological: Negative for dizziness, light-headedness and headaches.    Allergies  Neosporin  Home Medications   Current Outpatient Rx  Name Route Sig Dispense Refill  . ALPHA LIPOIC ACID PO Oral Take 100 mg by mouth 2 (two) times daily.    . ASPIRIN EC 81 MG PO TBEC Oral Take 81 mg by mouth daily.      [name] VITAMIN D-3 PO Oral Take 2 tablets by mouth daily.    [name] GLIPIZIDE ER 2.5 MG PO TB24 Oral Take 2.5 mg by mouth daily.      [name] HYDROCHLOROTHIAZIDE 25 MG PO TABS Oral Take 25 mg by mouth  daily.      [name] METFORMIN HCL 500 MG PO TABS Oral Take 500 mg by mouth 2 (two) times daily with a meal.      . NIACIN ER (ANTIHYPERLIPIDEMIC) 500 MG PO TBCR Oral Take 1,000 mg by mouth at bedtime.      [name] FISH OIL PO Oral Take 4 capsules by mouth 2 (two) times daily.      [name] OMEPRAZOLE-SODIUM BICARBONATE 40-1100 MG PO CAPS Oral Take 1 capsule by mouth daily before breakfast.    . RANITIDINE HCL 150 MG PO TABS Oral Take 150 mg by mouth 2 (two) times daily.    [name] SIMVASTATIN 80 MG PO TABS Oral Take 40 mg by mouth at bedtime.        BP 156/79  Pulse 99  Temp 97.6 F (36.4 C)  Resp 18  SpO2 98%  Physical Exam  Nursing note and vitals reviewed. Constitutional: He is oriented to person, place, and time. He appears well-developed and well-nourished. No distress.  HENT:  Head: Normocephalic and atraumatic.  Right Ear: External ear normal.  Left Ear: External ear normal.  Eyes: Conjunctivae are normal. Right eye exhibits no discharge. Left eye exhibits no discharge. No scleral icterus.  Neck: Neck supple. No tracheal deviation present.  Cardiovascular: Normal rate,  regular rhythm, normal heart sounds and intact distal pulses.   Pulmonary/Chest: Effort normal and breath sounds normal. No stridor. No respiratory distress. He has no wheezes. He has no rales.  Abdominal: Soft. Bowel sounds are normal. He exhibits no distension and no mass. There is tenderness (mild epigastric). There is no rigidity, no rebound, no guarding, no tenderness at McBurney's point and negative Murphy's sign.  Musculoskeletal: He exhibits no edema and no tenderness.  Neurological: He is alert and oriented to person, place, and time. He has normal strength. No sensory deficit. Cranial nerve deficit:  no gross defecits noted. He exhibits normal muscle tone. He displays no seizure activity. Coordination normal.  Skin: Skin is warm and dry. No rash noted.  Psychiatric: He has a normal mood and affect.    ED Course  Procedures  (including critical care time)  Labs Reviewed  COMPREHENSIVE METABOLIC PANEL - Abnormal; Notable for the following:    Sodium 131 (*)     Chloride 92 (*)     Glucose, Bld 113 (*)     All other components within normal limits  LIPASE, BLOOD - Abnormal; Notable for the following:    Lipase 143 (*)     All other components within normal limits  CBC  DIFFERENTIAL  URINALYSIS, ROUTINE W REFLEX MICROSCOPIC  BASIC METABOLIC PANEL  CBC   Ct Abdomen Pelvis W Contrast  03/03/2012  *RADIOLOGY REPORT*  Clinical Data: Cholelithiasis.  Possible pancreatitis.  CT ABDOMEN AND PELVIS WITH CONTRAST  Technique:  Multidetector CT imaging of the abdomen and pelvis was performed following the standard protocol during bolus administration of intravenous contrast.  Contrast: OMNIPAQUE IOHEXOL 300 MG/ML  SOLN  Comparison: Ultrasound of 03/02/2012 [facility]).  Findings: Incompletely imaged left lower lobe lung nodule which measures 5 mm on image 1. Normal heart size without pericardial or pleural effusion.  Right coronary artery disease.  Small hiatal hernia.  Normal liver, spleen.  Underdistended proximal stomach.  The pancreatic body and tail are unremarkable.  There is subtle soft tissue fullness within the pancreatic head/uncinate process with surrounding edema.  Example image 31 of series 2.  This is presumably related to the clinical history pancreatitis.  No common duct dilatation. Pancreatic duct upper normal in region of pancreatic body, including on coronal image 42.  Cholelithiasis without acute cholecystitis.  No vascular complication.  No peripancreatic fluid collection.  Normal right adrenal gland.  A left adrenal nodule measures 1.2 cm and is indeterminate.  Normal kidneys.  Mild perirenal edema is symmetric and nonspecific. Mild non aneurysmal dilatation of the infrarenal aorta; 2.6 cm.  No surrounding hemorrhage.  Iliac atherosclerosis which is advanced for age. No retroperitoneal or  retrocrural adenopathy.  The rectosigmoid appears thick-walled on image 57, felt to be due to underdistension.  Separate area of apparent descending/sigmoid junction wall thickening image 59.  Normal appendix. Normal small bowel without abdominal ascites.  Fat containing left inguinal hernia. No pelvic adenopathy. Normal urinary bladder and prostate.  No significant free fluid. Scattered tiny sclerotic lesions which are likely bone islands.  IMPRESSION:  1.  Subtle soft tissue fullness of and edema surrounding the pancreatic head/uncinate process.  This is most likely related to pancreatitis, given clinical history and elevated pancreatic enzymes.  Given the borderline dilatation of pancreatic duct, consider follow-up with pre post contrast abdominal MRI when the patient is improved from the acute symptoms to exclude unlikely underlying obstructive pancreatic head mass. 2.  Cholelithiasis without acute cholecystitis or biliary  ductal dilatation. 3.  Advanced atherosclerosis, including within the coronary arteries.  Non aneurysmal aortic dilatation. 4.  Foci of apparent colonic wall thickening.  Favored to be due to underdistension.  If the patient has not undergone recent colonoscopy, this should be considered with attention to these areas. 5.  Left lower lobe lung nodule. If the patient is at high risk for bronchogenic carcinoma, follow-up chest CT at 6-12 months is recommended.  If the patient is at low risk for bronchogenic carcinoma, follow-up chest CT at 12 months is recommended.  This recommendation follows the consensus statement: "Guidelines for Management of Small Pulmonary Nodules Detected on CT Scans: A Statement from the Fleischner Society" as published in Radiology 2005; 237:395-400. Online at: DietDisorder.cz. 6.  Left adrenal nodule which is indeterminate.  This could also be evaluated on follow-up MRI.  Original Report Authenticated By: Consuello Bossier, M.D.      1. Pancreatitis, acute       MDM  Patient has persistent pancreatitis. I discussed the case with general surgery. Dr. Dwain Sarna feels that the patient should be admitted to the hospital for his pancreatitis. Once his right eye this is resolved he will need cholecystectomy. The patient is overall noted that his symptoms are getting better but he still does have persistent upper abdominal pain. I discussed the case with the hospitalist service. Dr. Lovell Sheehan will be coming to evaluate the patient for admission         Celene Kras, MD 03/03/12 2017

## 2012-03-04 DIAGNOSIS — K859 Acute pancreatitis without necrosis or infection, unspecified: Secondary | ICD-10-CM

## 2012-03-04 DIAGNOSIS — E119 Type 2 diabetes mellitus without complications: Secondary | ICD-10-CM

## 2012-03-04 DIAGNOSIS — K8020 Calculus of gallbladder without cholecystitis without obstruction: Secondary | ICD-10-CM

## 2012-03-04 DIAGNOSIS — R1013 Epigastric pain: Secondary | ICD-10-CM

## 2012-03-04 LAB — BASIC METABOLIC PANEL
BUN: 6 mg/dL (ref 6–23)
CO2: 23 mEq/L (ref 19–32)
Calcium: 9.1 mg/dL (ref 8.4–10.5)
GFR calc non Af Amer: >90 mL/min (ref >90)
Glucose, Bld: 113 mg/dL — ABNORMAL HIGH (ref 70–99)
Sodium: 131 mEq/L — ABNORMAL LOW (ref 135–145)

## 2012-03-04 LAB — GLUCOSE, CAPILLARY
Glucose-Capillary: 103 mg/dL — ABNORMAL HIGH (ref 70–99)
Glucose-Capillary: 109 mg/dL — ABNORMAL HIGH (ref 70–99)
Glucose-Capillary: 216 mg/dL — ABNORMAL HIGH (ref 70–99)
Glucose-Capillary: 96 mg/dL (ref 70–99)

## 2012-03-04 LAB — CBC
MCH: 30.4 pg (ref 26.0–34.0)
MCHC: 35.5 g/dL (ref 30.0–36.0)
MCV: 85.8 fL (ref 78.0–100.0)
Platelets: 285 10*3/uL (ref 150–400)
RBC: 4.73 MIL/uL (ref 4.22–5.81)

## 2012-03-04 MED ORDER — CHLORHEXIDINE GLUCONATE CLOTH 2 % EX PADS
6.0000 | MEDICATED_PAD | Freq: Every day | CUTANEOUS | Status: DC
  Administered 2012-03-05: 6 via TOPICAL

## 2012-03-04 MED ORDER — CEFAZOLIN SODIUM-DEXTROSE 2-3 GM-% IV SOLR
2.0000 g | Freq: Once | INTRAVENOUS | Status: AC
  Administered 2012-03-05: 2 g via INTRAVENOUS
  Filled 2012-03-04: qty 50

## 2012-03-04 MED ORDER — HYDRALAZINE HCL 20 MG/ML IJ SOLN
5.0000 mg | Freq: Three times a day (TID) | INTRAMUSCULAR | Status: DC | PRN
  Filled 2012-03-04: qty 0.25

## 2012-03-04 MED ORDER — MUPIROCIN 2 % EX OINT
1.0000 "application " | TOPICAL_OINTMENT | Freq: Two times a day (BID) | CUTANEOUS | Status: DC
  Administered 2012-03-04 – 2012-03-05 (×4): 1 via NASAL
  Filled 2012-03-04: qty 22

## 2012-03-04 NOTE — Progress Notes (Signed)
Subjective: Feeling better.  Pain improved but has some nausea without vomiting  Objective: Vital signs in last 24 hours: Temp:  [97.4 F (36.3 C)-97.7 F (36.5 C)] 97.7 F (36.5 C) (06/15 0501) Pulse Rate:  [61-99] 76  (06/15 0501) Resp:  [18-20] 18  (06/15 0501) BP: (143-157)/(79-86) 157/82 mmHg (06/15 0501) SpO2:  [94 %-98 %] 94 % (06/15 0501) Weight:  [172 lb 9.9 oz (78.3 kg)] 172 lb 9.9 oz (78.3 kg) (06/14 2140) Last BM Date: 03/02/12  Intake/Output from previous day: 06/14 0701 - 06/15 0700 In: 1217.1 [I.V.:1207.1; IV Piggyback:10] Out: -  Intake/Output this shift:    General appearance: alert, cooperative and no distress Resp: bilateral wheezing, nonlabored Cardio: normal rate, regular GI: soft, nt, nd, no peritoneal signs  Lab Results:   Basename 03/04/12 0553 03/03/12 1657  WBC 8.8 9.2  HGB 14.4 15.8  HCT 40.6 44.5  PLT 285 292   BMET  Basename 03/04/12 0553 03/03/12 1657  NA 131* 131*  K 4.3 4.5  CL 97 92*  CO2 23 26  GLUCOSE 113* 113*  BUN 6 7  CREATININE 0.82 0.85  CALCIUM 9.1 9.9   PT/INR No results found for this basename: LABPROT:2,INR:2 in the last 72 hours ABG No results found for this basename: PHART:2,PCO2:2,PO2:2,HCO3:2 in the last 72 hours  Studies/Results: Ct Abdomen Pelvis W Contrast  03/03/2012  *RADIOLOGY REPORT*  Clinical Data: Cholelithiasis.  Possible pancreatitis.  CT ABDOMEN AND PELVIS WITH CONTRAST  Technique:  Multidetector CT imaging of the abdomen and pelvis was performed following the standard protocol during bolus administration of intravenous contrast.  Contrast: OMNIPAQUE IOHEXOL 300 MG/ML  SOLN  Comparison: Ultrasound of 03/02/2012 [facility]).  Findings: Incompletely imaged left lower lobe lung nodule which measures 5 mm on image 1. Normal heart size without pericardial or pleural effusion.  Right coronary artery disease.  Small hiatal hernia.  Normal liver, spleen.  Underdistended proximal stomach.  The  pancreatic body and tail are unremarkable.  There is subtle soft tissue fullness within the pancreatic head/uncinate process with surrounding edema.  Example image 31 of series 2.  This is presumably related to the clinical history pancreatitis.  No common duct dilatation. Pancreatic duct upper normal in region of pancreatic body, including on coronal image 42.  Cholelithiasis without acute cholecystitis.  No vascular complication.  No peripancreatic fluid collection.  Normal right adrenal gland.  A left adrenal nodule measures 1.2 cm and is indeterminate.  Normal kidneys.  Mild perirenal edema is symmetric and nonspecific. Mild non aneurysmal dilatation of the infrarenal aorta; 2.6 cm.  No surrounding hemorrhage.  Iliac atherosclerosis which is advanced for age. No retroperitoneal or retrocrural adenopathy.  The rectosigmoid appears thick-walled on image 57, felt to be due to underdistension.  Separate area of apparent descending/sigmoid junction wall thickening image 59.  Normal appendix. Normal small bowel without abdominal ascites.  Fat containing left inguinal hernia. No pelvic adenopathy. Normal urinary bladder and prostate.  No significant free fluid. Scattered tiny sclerotic lesions which are likely bone islands.  IMPRESSION:  1.  Subtle soft tissue fullness of and edema surrounding the pancreatic head/uncinate process.  This is most likely related to pancreatitis, given clinical history and elevated pancreatic enzymes.  Given the borderline dilatation of pancreatic duct, consider follow-up with pre post contrast abdominal MRI when the patient is improved from the acute symptoms to exclude unlikely underlying obstructive pancreatic head mass. 2.  Cholelithiasis without acute cholecystitis or biliary ductal dilatation. 3.  Advanced  atherosclerosis, including within the coronary arteries.  Non aneurysmal aortic dilatation. 4.  Foci of apparent colonic wall thickening.  Favored to be due to underdistension.  If  the patient has not undergone recent colonoscopy, this should be considered with attention to these areas. 5.  Left lower lobe lung nodule. If the patient is at high risk for bronchogenic carcinoma, follow-up chest CT at 6-12 months is recommended.  If the patient is at low risk for bronchogenic carcinoma, follow-up chest CT at 12 months is recommended.  This recommendation follows the consensus statement: "Guidelines for Management of Small Pulmonary Nodules Detected on CT Scans: A Statement from the Fleischner Society" as published in Radiology 2005; 237:395-400. Online at: DietDisorder.cz. 6.  Left adrenal nodule which is indeterminate.  This could also be evaluated on follow-up MRI.  Original Report Authenticated By: Consuello Bossier, M.D.    Anti-infectives: Anti-infectives    None      Assessment/Plan: s/p * No surgery found * Seems to be a mild case of pancreatitits.  symptoms nearly improved.  Would keep on clears and then NPO after MN for possible cholecystectomy tomorrow. The risks of infection, bleeding, pain, persistent symptoms, scarring, injury to bowel or bile ducts, retained stone, diarrhea, need for additional procedures, and need for open surgery discussed with the patient.   LOS: 1 day    Lodema Pilot DAVID 03/04/2012

## 2012-03-04 NOTE — Progress Notes (Signed)
Pt.admitted to 5525,AAO X 4; from ED,with IVF infusing well on  Rt.AC.Oriented pt.to the room,staff  & call bell.Showed safety video.Call bell with in reach,& advised to call for any needs.Full assessment charted in EPIC.Will continue to monitor pt.

## 2012-03-04 NOTE — Progress Notes (Signed)
[facility] PROGRESS NOTE  [name] [name] ZOX:[id number] DOB: 1945/10/28 DOA: 03/03/2012   Assessment/Plan: Patient Active Hospital Problem List: Gallstone pancreatitis (03/03/2012) -surgery was consulted, patient was put n.p.o. after midnight for possible cholecystectomy tomorrow. He relates his pain is much improved.   DM (diabetes mellitus) (09/16/2011) -blood glucoses well controlled, he only required 3 units sliding scale insulin. -Agree with holding his metformin. If his blood glucose goes above 200 we'll consider starting low dose Lantus until after the surgery.  Hyponatremia (09/18/2011) -this probably secondary to decreased intravascular volume, his chloride is less than 100 we'll start him on IV fluids aggressively.  Hypertension (03/03/2012) -stopped hydrochlorothiazide, use Norvasc if blood pressure constantly greater 160/90 and hydralazine IV when necessary if blood pressure greater than 180/100.  Code Status: Full code Family Communication: Patient and wife Spouse [phone] Disposition Plan: Home  Lambert Keto, MD  [facility] Pager [phone]  If 7PM-7AM, please contact night-coverage [email or web address] Password [email or web address] 03/04/2012, 10:21 AM   LOS: 1 day   Procedures:  Cholecystectomy on 03/05/2012  Antibiotics:  None  Subjective: -Patient relates his pain is much improved. He is able to tolerate clear liquid diets and does not cause him pain. -In good spirits.  Objective: Filed Vitals:   03/03/12 2112 03/03/12 2140 03/04/12 0501 03/04/12 0953  BP: 145/86 147/82 157/82 150/75  Pulse: 61 83 76 64  Temp: 97.5 F (36.4 C) 97.4 F (36.3 C) 97.7 F (36.5 C) 97.6 F (36.4 C)  TempSrc: Oral Oral Oral Oral  Resp: 18 20 18 18   Height:  5\' 6"  (1.676 m)    Weight:  78.3 kg (172 lb 9.9 oz)    SpO2: 95% 94% 94% 93%    Intake/Output Summary (Last 24 hours) at 03/04/12 1021 Last data filed at 03/04/12 6578  Gross per 24 hour  Intake 1217.09  ml  Output      0 ml  Net 1217.09 ml   Weight change:   Exam:  General: Alert, awake, oriented x3, in no acute distress.  HEENT: No bruits, no goiter.  Heart: Regular rate and rhythm, without murmurs, rubs, gallops.  Lungs: Good air movement, bilateral air movement.  Abdomen: Soft, nontender, tender to palpation in the epigastric and, positive bowel sounds.  Neuro: Grossly intact, nonfocal.   Data Reviewed: Basic Metabolic Panel:  Lab 03/04/12 4696 03/03/12 1657 03/02/12 2257  NA 131* 131* 131*  K 4.3 4.5 --  CL 97 92* 94*  CO2 23 26 --  GLUCOSE 113* 113* 119*  BUN 6 7 7   CREATININE 0.82 0.85 1.00  CALCIUM 9.1 9.9 --  MG -- -- --  PHOS -- -- --   Liver Function Tests:  Lab 03/03/12 1657 03/02/12 2354  AST 16 23  ALT 11 15  ALKPHOS 69 70  BILITOT 0.5 0.5  PROT 7.5 7.8  ALBUMIN 4.4 4.5    Lab 03/04/12 0553 03/03/12 1657 03/02/12 2354  LIPASE 103* 143* 176*  AMYLASE -- -- --   No results found for this basename: AMMONIA:5 in the last 168 hours CBC:  Lab 03/04/12 0553 03/03/12 1657 03/02/12 2354 03/02/12 2257  WBC 8.8 9.2 15.9* --  NEUTROABS -- 4.7 10.5* --  HGB 14.4 15.8 16.2 17.3*  HCT 40.6 44.5 45.5 51.0  MCV 85.8 85.6 86.8 --  PLT 285 292 317 --   Cardiac Enzymes: No results found for this basename: CKTOTAL:5,CKMB:5,CKMBINDEX:5,TROPONINI:5 in the last 168 hours BNP: No components found with this basename: POCBNP:5 CBG:  Lab 03/04/12  1610 03/04/12 0409 03/03/12 2348 03/02/12 2219  GLUCAP 117* 96 216* 109*    No results found for this or any previous visit (from the past 240 hour(s)).   Studies: Ct Abdomen Pelvis W Contrast  03/03/2012  *RADIOLOGY REPORT*  Clinical Data: Cholelithiasis.  Possible pancreatitis.  CT ABDOMEN AND PELVIS WITH CONTRAST  Technique:  Multidetector CT imaging of the abdomen and pelvis was performed following the standard protocol during bolus administration of intravenous contrast.  Contrast: OMNIPAQUE IOHEXOL 300  MG/ML  SOLN  Comparison: Ultrasound of 03/02/2012 [facility]).  Findings: Incompletely imaged left lower lobe lung nodule which measures 5 mm on image 1. Normal heart size without pericardial or pleural effusion.  Right coronary artery disease.  Small hiatal hernia.  Normal liver, spleen.  Underdistended proximal stomach.  The pancreatic body and tail are unremarkable.  There is subtle soft tissue fullness within the pancreatic head/uncinate process with surrounding edema.  Example image 31 of series 2.  This is presumably related to the clinical history pancreatitis.  No common duct dilatation. Pancreatic duct upper normal in region of pancreatic body, including on coronal image 42.  Cholelithiasis without acute cholecystitis.  No vascular complication.  No peripancreatic fluid collection.  Normal right adrenal gland.  A left adrenal nodule measures 1.2 cm and is indeterminate.  Normal kidneys.  Mild perirenal edema is symmetric and nonspecific. Mild non aneurysmal dilatation of the infrarenal aorta; 2.6 cm.  No surrounding hemorrhage.  Iliac atherosclerosis which is advanced for age. No retroperitoneal or retrocrural adenopathy.  The rectosigmoid appears thick-walled on image 57, felt to be due to underdistension.  Separate area of apparent descending/sigmoid junction wall thickening image 59.  Normal appendix. Normal small bowel without abdominal ascites.  Fat containing left inguinal hernia. No pelvic adenopathy. Normal urinary bladder and prostate.  No significant free fluid. Scattered tiny sclerotic lesions which are likely bone islands.  IMPRESSION:  1.  Subtle soft tissue fullness of and edema surrounding the pancreatic head/uncinate process.  This is most likely related to pancreatitis, given clinical history and elevated pancreatic enzymes.  Given the borderline dilatation of pancreatic duct, consider follow-up with pre post contrast abdominal MRI when the patient is improved from the acute  symptoms to exclude unlikely underlying obstructive pancreatic head mass. 2.  Cholelithiasis without acute cholecystitis or biliary ductal dilatation. 3.  Advanced atherosclerosis, including within the coronary arteries.  Non aneurysmal aortic dilatation. 4.  Foci of apparent colonic wall thickening.  Favored to be due to underdistension.  If the patient has not undergone recent colonoscopy, this should be considered with attention to these areas. 5.  Left lower lobe lung nodule. If the patient is at high risk for bronchogenic carcinoma, follow-up chest CT at 6-12 months is recommended.  If the patient is at low risk for bronchogenic carcinoma, follow-up chest CT at 12 months is recommended.  This recommendation follows the consensus statement: "Guidelines for Management of Small Pulmonary Nodules Detected on CT Scans: A Statement from the Fleischner Society" as published in Radiology 2005; 237:395-400. Online at: DietDisorder.cz. 6.  Left adrenal nodule which is indeterminate.  This could also be evaluated on follow-up MRI.  Original Report Authenticated By: Consuello Bossier, M.D.    Scheduled Meds:    . sodium chloride  1,000 mL Intravenous Once  . atorvastatin  40 mg Oral q1800  . enoxaparin  40 mg Subcutaneous QHS  . insulin aspart  0-9 Units Subcutaneous Q4H  . nicotine  21 mg  Transdermal Daily  . nicotine  21 mg Transdermal Once  . ondansetron  4 mg Intravenous Once  . pantoprazole (PROTONIX) IV  40 mg Intravenous Q12H  . DISCONTD: enoxaparin (LOVENOX) injection  40 mg Subcutaneous To Major   Continuous Infusions:    . sodium chloride 1,000 mL (03/03/12 1840)  . sodium chloride 100 mL/hr at 03/04/12 0404

## 2012-03-04 NOTE — ED Provider Notes (Signed)
I saw and evaluated the patient, reviewed the resident's note and I agree with the findings and plan.  Abdominal pain x 6 months with +H pylori and heterogenous pancrease on outpatient [place]. Abdomen soft and nontender.  Negative murphy's sign.  Glynn Octave, MD 03/04/12 2328

## 2012-03-05 ENCOUNTER — Encounter (HOSPITAL_COMMUNITY): Payer: Self-pay | Admitting: Anesthesiology

## 2012-03-05 ENCOUNTER — Encounter (HOSPITAL_COMMUNITY)
Admission: EM | Disposition: A | Discharge status: Home / Self Care | Payer: Self-pay | Source: Ambulatory Visit | Attending: Internal Medicine

## 2012-03-05 ENCOUNTER — Inpatient Hospital Stay (HOSPITAL_COMMUNITY): Payer: Medicare Other

## 2012-03-05 ENCOUNTER — Inpatient Hospital Stay (HOSPITAL_COMMUNITY): Payer: Medicare Other | Admitting: Anesthesiology

## 2012-03-05 DIAGNOSIS — K8020 Calculus of gallbladder without cholecystitis without obstruction: Secondary | ICD-10-CM

## 2012-03-05 DIAGNOSIS — K859 Acute pancreatitis without necrosis or infection, unspecified: Secondary | ICD-10-CM

## 2012-03-05 DIAGNOSIS — E119 Type 2 diabetes mellitus without complications: Secondary | ICD-10-CM

## 2012-03-05 DIAGNOSIS — R1013 Epigastric pain: Secondary | ICD-10-CM

## 2012-03-05 HISTORY — PX: CHOLECYSTECTOMY: SHX55

## 2012-03-05 HISTORY — PX: UMBILICAL HERNIA REPAIR: SHX196

## 2012-03-05 LAB — GLUCOSE, CAPILLARY
Glucose-Capillary: 122 mg/dL — ABNORMAL HIGH (ref 70–99)
Glucose-Capillary: 140 mg/dL — ABNORMAL HIGH (ref 70–99)

## 2012-03-05 SURGERY — LAPAROSCOPIC CHOLECYSTECTOMY WITH INTRAOPERATIVE CHOLANGIOGRAM
Anesthesia: General | Site: Abdomen | Wound class: Clean Contaminated

## 2012-03-05 MED ORDER — LIDOCAINE HCL (CARDIAC) 20 MG/ML IV SOLN
INTRAVENOUS | Status: DC | PRN
  Administered 2012-03-05: 50 mg via INTRAVENOUS

## 2012-03-05 MED ORDER — BUPIVACAINE-EPINEPHRINE PF 0.5-1:200000 % IJ SOLN
INTRAMUSCULAR | Status: DC | PRN
  Administered 2012-03-05: 15 mL

## 2012-03-05 MED ORDER — HYDROMORPHONE HCL PF 1 MG/ML IJ SOLN
INTRAMUSCULAR | Status: AC
  Filled 2012-03-05: qty 1

## 2012-03-05 MED ORDER — PROPOFOL 10 MG/ML IV EMUL
INTRAVENOUS | Status: DC | PRN
  Administered 2012-03-05: 200 mg via INTRAVENOUS

## 2012-03-05 MED ORDER — ONDANSETRON HCL 4 MG/2ML IJ SOLN
INTRAMUSCULAR | Status: DC | PRN
  Administered 2012-03-05: 4 mg via INTRAVENOUS

## 2012-03-05 MED ORDER — ARTIFICIAL TEARS OP OINT
TOPICAL_OINTMENT | OPHTHALMIC | Status: DC | PRN
  Administered 2012-03-05: 1 via OPHTHALMIC

## 2012-03-05 MED ORDER — CEFAZOLIN SODIUM-DEXTROSE 2-3 GM-% IV SOLR
2.0000 g | Freq: Once | INTRAVENOUS | Status: DC
  Filled 2012-03-05: qty 50

## 2012-03-05 MED ORDER — HEMOSTATIC AGENTS (NO CHARGE) OPTIME
TOPICAL | Status: DC | PRN
  Administered 2012-03-05: 1 via TOPICAL

## 2012-03-05 MED ORDER — SODIUM CHLORIDE 0.9 % IV SOLN
INTRAVENOUS | Status: DC
  Administered 2012-03-05: 75 mL via INTRAVENOUS
  Administered 2012-03-06: 08:00:00 via INTRAVENOUS

## 2012-03-05 MED ORDER — GLYCOPYRROLATE 0.2 MG/ML IJ SOLN
INTRAMUSCULAR | Status: DC | PRN
  Administered 2012-03-05: .6 mg via INTRAVENOUS
  Administered 2012-03-05: 0.2 mg via INTRAVENOUS

## 2012-03-05 MED ORDER — HYDROMORPHONE HCL PF 1 MG/ML IJ SOLN
0.2500 mg | INTRAMUSCULAR | Status: DC | PRN
  Administered 2012-03-05 (×2): 0.5 mg via INTRAVENOUS

## 2012-03-05 MED ORDER — LIDOCAINE HCL 4 % MT SOLN
OROMUCOSAL | Status: DC | PRN
  Administered 2012-03-05: 4 mL via TOPICAL

## 2012-03-05 MED ORDER — FENTANYL CITRATE 0.05 MG/ML IJ SOLN
INTRAMUSCULAR | Status: DC | PRN
  Administered 2012-03-05: 50 ug via INTRAVENOUS
  Administered 2012-03-05: 100 ug via INTRAVENOUS
  Administered 2012-03-05: 50 ug via INTRAVENOUS
  Administered 2012-03-05 (×2): 25 ug via INTRAVENOUS

## 2012-03-05 MED ORDER — SODIUM CHLORIDE 0.9 % IV SOLN
INTRAVENOUS | Status: DC | PRN
  Administered 2012-03-05: 08:00:00

## 2012-03-05 MED ORDER — AMLODIPINE BESYLATE 5 MG PO TABS
5.0000 mg | ORAL_TABLET | Freq: Every day | ORAL | Status: DC
  Administered 2012-03-05: 5 mg via ORAL
  Filled 2012-03-05 (×2): qty 1

## 2012-03-05 MED ORDER — ONDANSETRON HCL 4 MG/2ML IJ SOLN: 4.0000 mg | Freq: Once | INTRAMUSCULAR | Status: DC | PRN

## 2012-03-05 MED ORDER — SODIUM CHLORIDE 0.9 % IR SOLN
Status: DC | PRN
  Administered 2012-03-05: 1

## 2012-03-05 MED ORDER — ROCURONIUM BROMIDE 100 MG/10ML IV SOLN
INTRAVENOUS | Status: DC | PRN
  Administered 2012-03-05: 5 mg via INTRAVENOUS
  Administered 2012-03-05: 30 mg via INTRAVENOUS
  Administered 2012-03-05: 20 mg via INTRAVENOUS

## 2012-03-05 MED ORDER — LACTATED RINGERS IV SOLN
INTRAVENOUS | Status: DC | PRN
  Administered 2012-03-05 (×2): via INTRAVENOUS

## 2012-03-05 MED ORDER — NEOSTIGMINE METHYLSULFATE 1 MG/ML IJ SOLN
INTRAMUSCULAR | Status: DC | PRN
  Administered 2012-03-05: 4 mg via INTRAVENOUS

## 2012-03-05 SURGICAL SUPPLY — 45 items
ADH SKN CLS APL DERMABOND .7 (GAUZE/BANDAGES/DRESSINGS) ×1
ADH SKN CLS LQ APL DERMABOND (GAUZE/BANDAGES/DRESSINGS) ×1
APL SKNCLS STERI-STRIP NONHPOA (GAUZE/BANDAGES/DRESSINGS) ×1
APPLIER CLIP 5 13 M/L LIGAMAX5 (MISCELLANEOUS) ×2
APR CLP MED LRG 5 ANG JAW (MISCELLANEOUS) ×1
BAG SPEC RTRVL LRG 6X4 10 (ENDOMECHANICALS) ×1
BENZOIN TINCTURE PRP APPL 2/3 (GAUZE/BANDAGES/DRESSINGS) ×1 IMPLANT
BLADE SURG ROTATE 9660 (MISCELLANEOUS) ×1 IMPLANT
CANISTER SUCTION 2500CC (MISCELLANEOUS) ×2 IMPLANT
CHLORAPREP W/TINT 26ML (MISCELLANEOUS) ×2 IMPLANT
CLIP APPLIE 5 13 M/L LIGAMAX5 (MISCELLANEOUS) ×1 IMPLANT
CLOTH BEACON ORANGE TIMEOUT ST (SAFETY) ×2 IMPLANT
COVER MAYO STAND STRL (DRAPES) ×2 IMPLANT
COVER SURGICAL LIGHT HANDLE (MISCELLANEOUS) ×2 IMPLANT
DECANTER SPIKE VIAL GLASS SM (MISCELLANEOUS) ×2 IMPLANT
DERMABOND ADHESIVE PROPEN (GAUZE/BANDAGES/DRESSINGS) ×1
DERMABOND ADVANCED (GAUZE/BANDAGES/DRESSINGS) ×1
DERMABOND ADVANCED .7 DNX12 (GAUZE/BANDAGES/DRESSINGS) ×1 IMPLANT
DERMABOND ADVANCED .7 DNX6 (GAUZE/BANDAGES/DRESSINGS) IMPLANT
DRAPE C-ARM 42X72 X-RAY (DRAPES) ×2 IMPLANT
ELECT REM PT RETURN 9FT ADLT (ELECTROSURGICAL) ×2
ELECTRODE REM PT RTRN 9FT ADLT (ELECTROSURGICAL) ×1 IMPLANT
GAUZE SPONGE 2X2 8PLY STRL LF (GAUZE/BANDAGES/DRESSINGS) IMPLANT
GLOVE BIO SURGEON STRL SZ7 (GLOVE) ×5 IMPLANT
GLOVE BIOGEL PI IND STRL 7.5 (GLOVE) ×1 IMPLANT
GLOVE BIOGEL PI INDICATOR 7.5 (GLOVE) ×1
GOWN STRL NON-REIN LRG LVL3 (GOWN DISPOSABLE) ×9 IMPLANT
KIT BASIN OR (CUSTOM PROCEDURE TRAY) ×2 IMPLANT
KIT ROOM TURNOVER OR (KITS) ×2 IMPLANT
NS IRRIG 1000ML POUR BTL (IV SOLUTION) ×2 IMPLANT
PAD ARMBOARD 7.5X6 YLW CONV (MISCELLANEOUS) ×2 IMPLANT
POUCH SPECIMEN RETRIEVAL 10MM (ENDOMECHANICALS) ×2 IMPLANT
SCISSORS LAP 5X35 DISP (ENDOMECHANICALS) ×1 IMPLANT
SET CHOLANGIOGRAPH 5 50 .035 (SET/KITS/TRAYS/PACK) ×2 IMPLANT
SET IRRIG TUBING LAPAROSCOPIC (IRRIGATION / IRRIGATOR) ×2 IMPLANT
SLEEVE ENDOPATH XCEL 5M (ENDOMECHANICALS) ×4 IMPLANT
SPECIMEN JAR SMALL (MISCELLANEOUS) ×2 IMPLANT
SPONGE GAUZE 2X2 STER 10/PKG (GAUZE/BANDAGES/DRESSINGS) ×1
STRIP CLOSURE SKIN 1/2X4 (GAUZE/BANDAGES/DRESSINGS) ×1 IMPLANT
SUT MNCRL AB 4-0 PS2 18 (SUTURE) ×2 IMPLANT
TOWEL OR 17X24 6PK STRL BLUE (TOWEL DISPOSABLE) ×2 IMPLANT
TOWEL OR 17X26 10 PK STRL BLUE (TOWEL DISPOSABLE) ×2 IMPLANT
TRAY LAPAROSCOPIC (CUSTOM PROCEDURE TRAY) ×2 IMPLANT
TROCAR XCEL BLUNT TIP 100MML (ENDOMECHANICALS) ×2 IMPLANT
TROCAR XCEL NON-BLD 5MMX100MML (ENDOMECHANICALS) ×2 IMPLANT

## 2012-03-05 NOTE — Anesthesia Procedure Notes (Signed)
Procedure Name: Intubation Date/Time: 03/05/2012 7:36 AM Performed by: Luster Landsberg Pre-anesthesia Checklist: Patient identified, Emergency Drugs available, Suction available and Patient being monitored Patient Re-evaluated:Patient Re-evaluated prior to inductionOxygen Delivery Method: Circle system utilized Preoxygenation: Pre-oxygenation with 100% oxygen Intubation Type: IV induction Ventilation: Mask ventilation without difficulty and Oral airway inserted - appropriate to patient size Laryngoscope Size: Mac and 3 Grade View: Grade I Tube type: Oral Tube size: 7.5 mm Number of attempts: 1 Airway Equipment and Method: Stylet,  Oral airway and LTA kit utilized Placement Confirmation: ETT inserted through vocal cords under direct vision,  positive ETCO2 and breath sounds checked- equal and bilateral Secured at: 22 cm Tube secured with: Tape Dental Injury: Teeth and Oropharynx as per pre-operative assessment

## 2012-03-05 NOTE — Transfer of Care (Signed)
Immediate Anesthesia Transfer of Care Note  Patient: [name] [name]  Procedure(s) Performed: Procedure(s) (LRB): LAPAROSCOPIC CHOLECYSTECTOMY WITH INTRAOPERATIVE CHOLANGIOGRAM (N/A) HERNIA REPAIR UMBILICAL ADULT (N/A)  Patient Location: PACU  Anesthesia Type: General  Level of Consciousness: awake, alert  and oriented  Airway & Oxygen Therapy: Patient Spontanous Breathing and Patient connected to nasal cannula oxygen  Post-op Assessment: Report given to PACU RN, Post -op Vital signs reviewed and stable and Patient moving all extremities  Post vital signs: Reviewed and stable  Complications: No apparent anesthesia complications

## 2012-03-05 NOTE — Anesthesia Postprocedure Evaluation (Signed)
  Anesthesia Post-op Note  Patient: [name] [name]  Procedure(s) Performed: Procedure(s) (LRB): LAPAROSCOPIC CHOLECYSTECTOMY WITH INTRAOPERATIVE CHOLANGIOGRAM (N/A) HERNIA REPAIR UMBILICAL ADULT (N/A)  Patient Location: PACU  Anesthesia Type: General  Level of Consciousness: awake, oriented and patient cooperative  Airway and Oxygen Therapy: Patient Spontanous Breathing and Patient connected to nasal cannula oxygen  Post-op Pain: mild  Post-op Assessment: Post-op Vital signs reviewed, Patient's Cardiovascular Status Stable, Respiratory Function Stable, Patent Airway, No signs of Nausea or vomiting and Pain level controlled  Post-op Vital Signs: stable  Complications: No apparent anesthesia complications

## 2012-03-05 NOTE — Op Note (Signed)
Preoperative diagnosis: Gallstone pancreatitis, umbilical hernia Postoperative diagnosis: Same as above Procedure: #1 laparoscopic cholecystectomy with cholangiogram #2 primary umbilical hernia repair Surgeon: Dr. Harden Mo Asst.: Dr. Marcille Blanco Specimens: Gallbladder contents to pathology Estimated blood loss: Minimal Drains: None Complications: None Sponge needle count correct x2 in the operation Disposition to recovery in stable condition  Indications: This is a 66 year old male admitted to the hospital on Friday for pancreatitis. He had been seen a number times up to this point. He had chemical evidence of pancreatitis as well as a clinical history associated with that as well. He also had gallstones on a CT scan and an ultrasound had been obtained. We discussed a laparoscopic cholecystectomy once his pancreatitis had resolved. Today his pancreatitis is resolved we plan to proceed with surgery to prevent any further episodes.  Procedure: After informed consent was obtained the patient was taken to the operating room. He had sequential compression devices placed on his legs. He had 2 g of intravenous cefazolin administered. He was then placed under general endotracheal anesthesia without complication. His abdomen was then prepped and draped in the standard sterile surgical fashion. A surgical timeout was then performed.  I infiltrated quarter percent Marcaine below his umbilicus. I then made a curvilinear incision and carried this out down to the level of his umbilical hernia. I entered his umbilical hernia. I then placed a 0 Vicryl pursestring suture through the fascia. I then inserted a Hasson trocar and insufflated the abdomen to 15 mm mercury pressure. I then inserted 3 further 5 mm ports in the epigastrium and right upper quadrant under direct vision after infiltration with local anesthetic without complication. His gallbladder was then retracted cephalad. He had some adhesions from  his omentum and duodenum which were taken down sharply. Once that occurred we retracted the gallbladder cephalad and lateral. The critical view of safety was then obtained using both the cystic duct and cystic artery. I then placed a clip distally on the cystic duct. I introduced a Cook catheter and a ductotomy. The [facility] catheter was placed in the duct and clipped into position. A cholangiogram was then performed showing that I was in the cystic duct, filling of both sides of the liver, flow into the duodenum, and no evidence of any retained stones. The catheter was then removed and a cystic duct was clipped and divided. The artery was treated in a similar fashion. The gallbladder was then removed from the liver bed without any spillage. He was placed in an Endo Catch bag and removed from the umbilicus. I did have to enlarge his incision through his fascia just to get the gallbladder out. I then obtained hemostasis. Irrigation was performed until this was clear. I then placed a piece of Surgicel Snow in the liver bed. I did remove the umbilical trocar and and tied down my pursetring suture. I then repaired his umbilical hernia with 2 further 0 Vicryl figure-of-eight sutures. This completely obliterated the defect. I then desufflated the abdomen removed all trocars. I then closed incisions with 4 Monocryl and Dermabond. A dressing was also placed over his umbilicus. He tolerated this well was extubated and transferred to recovery stable.

## 2012-03-05 NOTE — H&P (View-Only) (Signed)
Subjective: Feeling better.  Pain improved but has some nausea without vomiting  Objective: Vital signs in last 24 hours: Temp:  [97.4 F (36.3 C)-97.7 F (36.5 C)] 97.7 F (36.5 C) (06/15 0501) Pulse Rate:  [61-99] 76  (06/15 0501) Resp:  [18-20] 18  (06/15 0501) BP: (143-157)/(79-86) 157/82 mmHg (06/15 0501) SpO2:  [94 %-98 %] 94 % (06/15 0501) Weight:  [172 lb 9.9 oz (78.3 kg)] 172 lb 9.9 oz (78.3 kg) (06/14 2140) Last BM Date: 03/02/12  Intake/Output from previous day: 06/14 0701 - 06/15 0700 In: 1217.1 [I.V.:1207.1; IV Piggyback:10] Out: -  Intake/Output this shift:    General appearance: alert, cooperative and no distress Resp: bilateral wheezing, nonlabored Cardio: normal rate, regular GI: soft, nt, nd, no peritoneal signs  Lab Results:   Basename 03/04/12 0553 03/03/12 1657  WBC 8.8 9.2  HGB 14.4 15.8  HCT 40.6 44.5  PLT 285 292   BMET  Basename 03/04/12 0553 03/03/12 1657  NA 131* 131*  K 4.3 4.5  CL 97 92*  CO2 23 26  GLUCOSE 113* 113*  BUN 6 7  CREATININE 0.82 0.85  CALCIUM 9.1 9.9   PT/INR No results found for this basename: LABPROT:2,INR:2 in the last 72 hours ABG No results found for this basename: PHART:2,PCO2:2,PO2:2,HCO3:2 in the last 72 hours  Studies/Results: Ct Abdomen Pelvis W Contrast  03/03/2012  *RADIOLOGY REPORT*  Clinical Data: Cholelithiasis.  Possible pancreatitis.  CT ABDOMEN AND PELVIS WITH CONTRAST  Technique:  Multidetector CT imaging of the abdomen and pelvis was performed following the standard protocol during bolus administration of intravenous contrast.  Contrast: 100mL OMNIPAQUE IOHEXOL 300 MG/ML  SOLN  Comparison: Ultrasound of 03/02/2012 ([facility]).  Findings: Incompletely imaged left lower lobe lung nodule which measures 5 mm on image 1. Normal heart size without pericardial or pleural effusion.  Right coronary artery disease.  Small hiatal hernia.  Normal liver, spleen.  Underdistended proximal stomach.  The  pancreatic body and tail are unremarkable.  There is subtle soft tissue fullness within the pancreatic head/uncinate process with surrounding edema.  Example image 31 of series 2.  This is presumably related to the clinical history pancreatitis.  No common duct dilatation. Pancreatic duct upper normal in region of pancreatic body, including on coronal image 42.  Cholelithiasis without acute cholecystitis.  No vascular complication.  No peripancreatic fluid collection.  Normal right adrenal gland.  A left adrenal nodule measures 1.2 cm and is indeterminate.  Normal kidneys.  Mild perirenal edema is symmetric and nonspecific. Mild non aneurysmal dilatation of the infrarenal aorta; 2.6 cm.  No surrounding hemorrhage.  Iliac atherosclerosis which is advanced for age. No retroperitoneal or retrocrural adenopathy.  The rectosigmoid appears thick-walled on image 57, felt to be due to underdistension.  Separate area of apparent descending/sigmoid junction wall thickening image 59.  Normal appendix. Normal small bowel without abdominal ascites.  Fat containing left inguinal hernia. No pelvic adenopathy. Normal urinary bladder and prostate.  No significant free fluid. Scattered tiny sclerotic lesions which are likely bone islands.  IMPRESSION:  1.  Subtle soft tissue fullness of and edema surrounding the pancreatic head/uncinate process.  This is most likely related to pancreatitis, given clinical history and elevated pancreatic enzymes.  Given the borderline dilatation of pancreatic duct, consider follow-up with pre post contrast abdominal MRI when the patient is improved from the acute symptoms to exclude unlikely underlying obstructive pancreatic head mass. 2.  Cholelithiasis without acute cholecystitis or biliary ductal dilatation. 3.  Advanced   atherosclerosis, including within the coronary arteries.  Non aneurysmal aortic dilatation. 4.  Foci of apparent colonic wall thickening.  Favored to be due to underdistension.  If  the patient has not undergone recent colonoscopy, this should be considered with attention to these areas. 5.  Left lower lobe lung nodule. If the patient is at high risk for bronchogenic carcinoma, follow-up chest CT at 6-12 months is recommended.  If the patient is at low risk for bronchogenic carcinoma, follow-up chest CT at 12 months is recommended.  This recommendation follows the consensus statement: "Guidelines for Management of Small Pulmonary Nodules Detected on CT Scans: A Statement from the Fleischner Society" as published in Radiology 2005; 237:395-400. Online at: http://www.med.[employer].edu/rad/res/Fleischner-nodule.htm. 6.  Left adrenal nodule which is indeterminate.  This could also be evaluated on follow-up MRI.  Original Report Authenticated By: KYLE D. TALBOT, M.D.    Anti-infectives: Anti-infectives    None      Assessment/Plan: s/p * No surgery found * Seems to be a mild case of pancreatitits.  symptoms nearly improved.  Would keep on clears and then NPO after MN for possible cholecystectomy tomorrow. The risks of infection, bleeding, pain, persistent symptoms, scarring, injury to bowel or bile ducts, retained stone, diarrhea, need for additional procedures, and need for open surgery discussed with the patient.   LOS: 1 day    Shuntia Exton DAVID 03/04/2012  

## 2012-03-05 NOTE — Discharge Instructions (Signed)
[facility] LAPAROSCOPIC SURGERY: POST OP INSTRUCTIONS  Always review your discharge instruction sheet given to you by the facility where your surgery was performed. IF YOU HAVE DISABILITY OR FAMILY LEAVE FORMS, YOU MUST BRING THEM TO THE OFFICE FOR PROCESSING.   DO NOT GIVE THEM TO YOUR DOCTOR.  1. A prescription for pain medication may be given to you upon discharge.  Take your pain medication as prescribed, if needed.  If narcotic pain medicine is not needed, then you may take acetaminophen (Tylenol), naprosyn (Alleve), or ibuprofen (Advil) as needed. 2. Take your usually prescribed medications unless otherwise directed. 3. If you need a refill on your pain medication, please contact your pharmacy.  They will contact our office to request authorization. Prescriptions will not be filled after 5pm or on week-ends. 4. You should follow a light diet the first few days after arrival home, such as soup and crackers, etc.  Be sure to include lots of fluids daily. 5. Most patients will experience some swelling and bruising in the area of the incisions.  Ice packs will help.  Swelling and bruising can take several days to resolve.  6. It is common to experience some constipation if taking pain medication after surgery.  Increasing fluid intake and taking a stool softener (such as Colace) will usually help or prevent this problem from occurring.  A mild laxative (Milk of Magnesia or Miralax) should be taken according to package instructions if there are no bowel movements after 48 hours. 7. Unless discharge instructions indicate otherwise, you may remove your bandages 48 hours after surgery, and you may shower at that time.  You may have steri-strips (small skin tapes) in place directly over the incision.  These strips should be left on the skin for 7-10 days.  If your surgeon used skin glue on the incision, you may shower in 24 hours.  The glue will flake  off over the next 2-3 weeks.  Any sutures or staples will be removed at the office during your follow-up visit. 8. ACTIVITIES:  You may resume regular (light) daily activities beginning the next day--such as daily self-care, walking, climbing stairs--gradually increasing activities as tolerated.  You may have sexual intercourse when it is comfortable.  Refrain from any heavy lifting or straining until approved by your doctor. a. You may drive when you are no longer taking prescription pain medication, you can comfortably wear a seatbelt, and you can safely maneuver your car and apply brakes. b. RETURN TO WORK:  __________________________________________________________ 9. You should see your doctor in the office for a follow-up appointment approximately 2-3 weeks after your surgery.  Make sure that you call for this appointment within a day or two after you arrive home to insure a convenient appointment time. 10. OTHER INSTRUCTIONS: __________________________________________________________________________________________________________________________ __________________________________________________________________________________________________________________________ WHEN TO CALL YOUR DOCTOR: 1. Fever over 101.0 2. Inability to urinate 3. Continued bleeding from incision. 4. Increased pain, redness, or drainage from the incision. 5. Increasing abdominal pain  The clinic staff is available to answer your questions during regular business hours.  Please don't hesitate to call and ask to speak to one of the nurses for clinical concerns.  If you have a medical emergency, go to the nearest emergency room or call 911.  A surgeon from [facility] is always on call at the hospital. [address], [address], [place], [place]  [address] ? [address] [address], [place], [place]   [address] [phone] ? [phone] ? [phone]   [phone] Web site: [email or web address]  

## 2012-03-05 NOTE — Progress Notes (Signed)
[facility] PROGRESS NOTE  [name] [name] ZOX:[id number] DOB: 09-04-1946 DOA: 03/03/2012   Assessment/Plan: Patient Active Hospital Problem List: Gallstone pancreatitis (03/03/2012): -s/p cholecystectomy with cholangiogram and no evidence of stones. -afebrile. -narcotics for pain. -diet per surgery. -? When to d/c   DM (diabetes mellitus) (09/16/2011) -blood glucoses well controlled, he only required 3 units sliding scale insulin. -Agree with holding his metformin. If his blood glucose goes above 200 we'll consider starting low dose Lantus until after the surgery.  Hyponatremia (09/18/2011) -this probably secondary to decreased intravascular volume, his chloride is less than 100 we'll start him on IV fluids aggressively.  Hypertension (03/03/2012) -stopped hydrochlorothiazide, start Norvasc.  Code Status: Full code Family Communication: Patient and wife Spouse [phone] Disposition Plan: Home  Lambert Keto, MD  [facility] Pager [phone]  If 7PM-7AM, please contact night-coverage [email or web address] Password [email or web address] 03/05/2012, 1:29 PM   LOS: 2 days   Procedures:  Cholecystectomy on 03/05/2012  Antibiotics:  None  Subjective: -He is able to tolerate clear liquid diets and does not cause him pain.   Objective: Filed Vitals:   03/05/12 1020 03/05/12 1021 03/05/12 1022 03/05/12 1100  BP:    166/90  Pulse: 73 73 73 76  Temp:   97.1 F (36.2 C) 97.7 F (36.5 C)  TempSrc:    Oral  Resp: 13 9 9 15   Height:      Weight:      SpO2: 92% 93% 92% 93%    Intake/Output Summary (Last 24 hours) at 03/05/12 1329 Last data filed at 03/05/12 0910  Gross per 24 hour  Intake 4351.67 ml  Output     50 ml  Net 4301.67 ml   Weight change:   Exam:  General: Alert, awake, oriented x3, in no acute distress.  HEENT: No bruits, no goiter.  Heart: Regular rate and rhythm, without murmurs, rubs, gallops.  Lungs: Good air movement, bilateral air movement.    Abdomen: Soft, nontender, tender to palpation in the epigastric and, positive bowel sounds.  Neuro: Grossly intact, nonfocal.   Data Reviewed: Basic Metabolic Panel:  Lab 03/04/12 6578 03/03/12 1657 03/02/12 2257  NA 131* 131* 131*  K 4.3 4.5 --  CL 97 92* 94*  CO2 23 26 --  GLUCOSE 113* 113* 119*  BUN 6 7 7   CREATININE 0.82 0.85 1.00  CALCIUM 9.1 9.9 --  MG -- -- --  PHOS -- -- --   Liver Function Tests:  Lab 03/03/12 1657 03/02/12 2354  AST 16 23  ALT 11 15  ALKPHOS 69 70  BILITOT 0.5 0.5  PROT 7.5 7.8  ALBUMIN 4.4 4.5    Lab 03/05/12 0605 03/04/12 0553 03/03/12 1657 03/02/12 2354  LIPASE 64* 103* 143* 176*  AMYLASE -- -- -- --   No results found for this basename: AMMONIA:5 in the last 168 hours CBC:  Lab 03/04/12 0553 03/03/12 1657 03/02/12 2354 03/02/12 2257  WBC 8.8 9.2 15.9* --  NEUTROABS -- 4.7 10.5* --  HGB 14.4 15.8 16.2 17.3*  HCT 40.6 44.5 45.5 51.0  MCV 85.8 85.6 86.8 --  PLT 285 292 317 --   Cardiac Enzymes: No results found for this basename: CKTOTAL:5,CKMB:5,CKMBINDEX:5,TROPONINI:5 in the last 168 hours BNP: No components found with this basename: POCBNP:5 CBG:  Lab 03/05/12 1156 03/05/12 0913 03/05/12 0430 03/05/12 0026 03/04/12 2013  GLUCAP 140* 152* 122* 120* 109*    Recent Results (from the past 240 hour(s))  SURGICAL PCR SCREEN     Status: Abnormal  Collection Time   03/04/12 10:28 AM      Component Value Range Status Comment   MRSA, PCR POSITIVE (*) NEGATIVE Final    Staphylococcus aureus POSITIVE (*) NEGATIVE Final      Studies: Ct Abdomen Pelvis W Contrast  03/03/2012  *RADIOLOGY REPORT*  Clinical Data: Cholelithiasis.  Possible pancreatitis.  CT ABDOMEN AND PELVIS WITH CONTRAST  Technique:  Multidetector CT imaging of the abdomen and pelvis was performed following the standard protocol during bolus administration of intravenous contrast.  Contrast: OMNIPAQUE IOHEXOL 300 MG/ML  SOLN  Comparison: Ultrasound of 03/02/2012  [facility]).  Findings: Incompletely imaged left lower lobe lung nodule which measures 5 mm on image 1. Normal heart size without pericardial or pleural effusion.  Right coronary artery disease.  Small hiatal hernia.  Normal liver, spleen.  Underdistended proximal stomach.  The pancreatic body and tail are unremarkable.  There is subtle soft tissue fullness within the pancreatic head/uncinate process with surrounding edema.  Example image 31 of series 2.  This is presumably related to the clinical history pancreatitis.  No common duct dilatation. Pancreatic duct upper normal in region of pancreatic body, including on coronal image 42.  Cholelithiasis without acute cholecystitis.  No vascular complication.  No peripancreatic fluid collection.  Normal right adrenal gland.  A left adrenal nodule measures 1.2 cm and is indeterminate.  Normal kidneys.  Mild perirenal edema is symmetric and nonspecific. Mild non aneurysmal dilatation of the infrarenal aorta; 2.6 cm.  No surrounding hemorrhage.  Iliac atherosclerosis which is advanced for age. No retroperitoneal or retrocrural adenopathy.  The rectosigmoid appears thick-walled on image 57, felt to be due to underdistension.  Separate area of apparent descending/sigmoid junction wall thickening image 59.  Normal appendix. Normal small bowel without abdominal ascites.  Fat containing left inguinal hernia. No pelvic adenopathy. Normal urinary bladder and prostate.  No significant free fluid. Scattered tiny sclerotic lesions which are likely bone islands.  IMPRESSION:  1.  Subtle soft tissue fullness of and edema surrounding the pancreatic head/uncinate process.  This is most likely related to pancreatitis, given clinical history and elevated pancreatic enzymes.  Given the borderline dilatation of pancreatic duct, consider follow-up with pre post contrast abdominal MRI when the patient is improved from the acute symptoms to exclude unlikely underlying obstructive  pancreatic head mass. 2.  Cholelithiasis without acute cholecystitis or biliary ductal dilatation. 3.  Advanced atherosclerosis, including within the coronary arteries.  Non aneurysmal aortic dilatation. 4.  Foci of apparent colonic wall thickening.  Favored to be due to underdistension.  If the patient has not undergone recent colonoscopy, this should be considered with attention to these areas. 5.  Left lower lobe lung nodule. If the patient is at high risk for bronchogenic carcinoma, follow-up chest CT at 6-12 months is recommended.  If the patient is at low risk for bronchogenic carcinoma, follow-up chest CT at 12 months is recommended.  This recommendation follows the consensus statement: "Guidelines for Management of Small Pulmonary Nodules Detected on CT Scans: A Statement from the Fleischner Society" as published in Radiology 2005; 237:395-400. Online at: DietDisorder.cz. 6.  Left adrenal nodule which is indeterminate.  This could also be evaluated on follow-up MRI.  Original Report Authenticated By: Consuello Bossier, M.D.    Scheduled Meds:    . atorvastatin  40 mg Oral q1800  .  ceFAZolin (ANCEF) IV  2 g Intravenous Once  . enoxaparin  40 mg Subcutaneous QHS  . HYDROmorphone      .  insulin aspart  0-9 Units Subcutaneous Q4H  . mupirocin ointment  1 application Nasal BID  . nicotine  21 mg Transdermal Daily  . ondansetron  4 mg Intravenous Once  . pantoprazole (PROTONIX) IV  40 mg Intravenous Q12H  . DISCONTD:  ceFAZolin (ANCEF) IV  2 g Intravenous Once  . DISCONTD: Chlorhexidine Gluconate Cloth  6 each Topical Q0600  . DISCONTD: nicotine  21 mg Transdermal Once   Continuous Infusions:    . sodium chloride    . DISCONTD: sodium chloride 1,000 mL (03/03/12 1840)  . DISCONTD: sodium chloride 100 mL/hr at 03/05/12 1610

## 2012-03-05 NOTE — Anesthesia Preprocedure Evaluation (Addendum)
Anesthesia Evaluation  Patient identified by MRN, date of birth, ID band Patient awake    Reviewed: Allergy & Precautions, H&P , NPO status , Patient's Chart, lab work & pertinent test results, reviewed documented beta blocker date and time   Airway Mallampati: I TM Distance: >3 FB Neck ROM: full    Dental   Pulmonary COPDCurrent Smoker,          Cardiovascular hypertension, Pt. on medications Rhythm:regular Rate:Normal     Neuro/Psych PSYCHIATRIC DISORDERS    GI/Hepatic   Endo/Other  Diabetes mellitus-, Well Controlled, Type 2, Oral Hypoglycemic Agents  Renal/GU      Musculoskeletal   Abdominal   Peds  Hematology   Anesthesia Other Findings   Reproductive/Obstetrics                          Anesthesia Physical Anesthesia Plan  ASA: II  Anesthesia Plan: General   Post-op Pain Management:    Induction: Intravenous  Airway Management Planned: Oral ETT  Additional Equipment:   Intra-op Plan:   Post-operative Plan: Extubation in OR  Informed Consent: I have reviewed the patients History and Physical, chart, labs and discussed the procedure including the risks, benefits and alternatives for the proposed anesthesia with the patient or authorized representative who has indicated his/her understanding and acceptance.     Plan Discussed with: CRNA, Anesthesiologist and Surgeon  Anesthesia Plan Comments:         Anesthesia Quick Evaluation

## 2012-03-05 NOTE — Interval H&P Note (Signed)
History and Physical Interval Note:  03/05/2012 9:05 AM  [name] [name]  has presented today for surgery, with the diagnosis of gallstones pancreatitis  The various methods of treatment have been discussed with the patient and family. After consideration of risks, benefits and other options for treatment, the patient has consented to  Procedure(s) (LRB): LAPAROSCOPIC CHOLECYSTECTOMY WITH INTRAOPERATIVE CHOLANGIOGRAM (N/A) HERNIA REPAIR UMBILICAL ADULT (N/A) as a surgical intervention .  The patients' history has been reviewed, patient examined, no change in status, stable for surgery.  I have reviewed the patients' chart and labs.  Questions were answered to the patient's satisfaction.     [name]

## 2012-03-06 ENCOUNTER — Encounter (HOSPITAL_COMMUNITY): Payer: Self-pay | Admitting: General Surgery

## 2012-03-06 LAB — GLUCOSE, CAPILLARY
Glucose-Capillary: 120 mg/dL — ABNORMAL HIGH (ref 70–99)
Glucose-Capillary: 146 mg/dL — ABNORMAL HIGH (ref 70–99)
Glucose-Capillary: 168 mg/dL — ABNORMAL HIGH (ref 70–99)

## 2012-03-06 MED ORDER — OXYCODONE HCL 5 MG PO TABS: 5.0000 mg | ORAL_TABLET | ORAL | Status: AC | PRN

## 2012-03-06 MED ORDER — OXYCODONE HCL 5 MG PO TABS: 5.0000 mg | ORAL_TABLET | ORAL | Status: DC | PRN

## 2012-03-06 NOTE — Discharge Summary (Signed)
  Physician Discharge Summary  Patient ID: [name] [name] MRN: [id number] DOB/AGE: 1945-10-19 66 y.o.  Admit date: 03/03/2012 Discharge date: 03/06/2012  Discharge Diagnoses Patient Active Problem List   Diagnosis Date Noted  . Gallstone pancreatitis 03/03/2012  . Gallstones 03/03/2012  . Abdominal pain 03/03/2012  . Hypertension 03/03/2012  . Tobacco use disorder 03/03/2012  . Hyponatremia 09/18/2011  . Orchitis and epididymitis 09/16/2011  . DM (diabetes mellitus) 09/16/2011  . Hyperlipidemia 09/16/2011    Consultants Harvette Lovell Sheehan, MD-Internal Medicine  Procedures Laparoscopic Cholecystectomy  HPI: 66 yr old male admitted with gallstone pancreatitis who was admitted through the [facility] on Friday  Hospital Course: Pt was admitted, placed on IVFs and bowel rest.  He was given prn pain meds and nausea meds.  Once his lipase had more normalized he was taken to the OR and underwent the procedure listed above.  He tolerated this well.  Post op day 1 he was able to tolerate a regular diet, walk well and his pain was well controlled.  At time of d/c, his incisions were c/d/i, vitals stable, tol diet and ambulating well.  He was felt stable for d/c home.    Medication List  As of 03/06/2012  8:50 AM   TAKE these medications         ALPHA LIPOIC ACID PO   Take 100 mg by mouth 2 (two) times daily.      aspirin EC 81 MG tablet   Take 81 mg by mouth daily.      FISH OIL PO   Take 4 capsules by mouth 2 (two) times daily.      glipiZIDE 2.5 MG 24 hr tablet   Commonly known as: GLUCOTROL XL   Take 2.5 mg by mouth daily.      hydrochlorothiazide 25 MG tablet   Commonly known as: HYDRODIURIL   Take 25 mg by mouth daily.      metFORMIN 500 MG tablet   Commonly known as: GLUCOPHAGE   Take 500 mg by mouth 2 (two) times daily with a meal.      niacin 500 MG CR tablet   Commonly known as: NIASPAN   Take 1,000 mg by mouth at bedtime.      omeprazole-sodium bicarbonate 40-1100 MG  per capsule   Commonly known as: ZEGERID   Take 1 capsule by mouth daily before breakfast.      oxyCODONE 5 MG immediate release tablet   Commonly known as: Oxy IR/ROXICODONE   Take 1 tablet (5 mg total) by mouth every 4 (four) hours as needed.      ranitidine 150 MG tablet   Commonly known as: ZANTAC   Take 150 mg by mouth 2 (two) times daily.      simvastatin 80 MG tablet   Commonly known as: ZOCOR   Take 40 mg by mouth at bedtime.      VITAMIN D-3 PO   Take 2 tablets by mouth daily.             Follow-up Information    Follow up with [facility], [facility] in 3 weeks.   Contact information:   [facility] [address] [place] [address] [phone]          Signed: Clance Boll, Cordelia Poche Pager: [phone] General Trauma PA Pager: [phone] Trauma Office number: [phone]  03/06/2012, 8:50 AM

## 2012-03-06 NOTE — Care Management Note (Signed)
    Page 1 of 1   03/06/2012     2:59:01 PM   CARE MANAGEMENT NOTE 03/06/2012  Patient:  [name] [name],[name] [name]   Account Number:  [id number]  Date Initiated:  03/06/2012  Documentation initiated by:  Letha Cape  Subjective/Objective Assessment:   dx pancreatitis  admit- lives with spouse.     Action/Plan:   Anticipated DC Date:  03/06/2012   Anticipated DC Plan:  HOME/SELF CARE      DC Planning Services  CM consult      Choice offered to / List presented to:             Status of service:  Completed, signed off Medicare Important Message given?   (If response is "NO", the following Medicare IM given date fields will be blank) Date Medicare IM given:   Date Additional Medicare IM given:    Discharge Disposition:  HOME/SELF CARE  Per UR Regulation:  Reviewed for med. necessity/level of care/duration of stay  If discussed at Long Length of Stay Meetings, dates discussed:    Comments:  03/06/12 14:58 Letha Cape RN, BSN [phone] patient lives with spouse, No needs anticipated.  Patient dc to home.

## 2012-03-08 ENCOUNTER — Telehealth (INDEPENDENT_AMBULATORY_CARE_PROVIDER_SITE_OTHER): Payer: Self-pay

## 2012-03-08 NOTE — Telephone Encounter (Signed)
LMOM stating the f/u appt with DrWakefield is for 7/3 arrive at 4:oo for 4:30.

## 2012-03-22 ENCOUNTER — Encounter (INDEPENDENT_AMBULATORY_CARE_PROVIDER_SITE_OTHER): Payer: Self-pay | Admitting: General Surgery

## 2012-03-22 ENCOUNTER — Ambulatory Visit (INDEPENDENT_AMBULATORY_CARE_PROVIDER_SITE_OTHER): Payer: Medicare Other | Admitting: General Surgery

## 2012-03-22 VITALS — BP 126/70 | HR 76 | Temp 97.8°F | Resp 16 | Ht 66.0 in | Wt 173.4 lb

## 2012-03-22 DIAGNOSIS — Z09 Encounter for follow-up examination after completed treatment for conditions other than malignant neoplasm: Secondary | ICD-10-CM

## 2012-03-22 NOTE — Progress Notes (Signed)
Subjective:     Patient ID: [name] [name], male   DOB: 1946-09-19, 66 y.o.   MRN: [id number]  HPI 66 yom s/p lap chole for gsp.  Doing well without complaints. Eating well, having bms.  Review of Systems     Objective:   Physical Exam Healing incisions without infection    Assessment:     S/p lap chole for gsp    Plan:     He can return to full activity and I will see back as needed.  I did repair umbo hernia primarily at same time and we discussed symptoms of recurrence.

## 2012-10-21 DIAGNOSIS — Z951 Presence of aortocoronary bypass graft: Secondary | ICD-10-CM

## 2012-10-21 DIAGNOSIS — I25119 Atherosclerotic heart disease of native coronary artery with unspecified angina pectoris: Secondary | ICD-10-CM

## 2012-10-21 HISTORY — PX: AORTO-FEMORAL BYPASS GRAFT: SHX885

## 2012-10-21 HISTORY — DX: Atherosclerotic heart disease of native coronary artery with unspecified angina pectoris: I25.119

## 2012-11-09 HISTORY — PX: CARDIAC CATHETERIZATION: SHX172

## 2012-11-18 DIAGNOSIS — Z9289 Personal history of other medical treatment: Secondary | ICD-10-CM

## 2012-11-18 HISTORY — PX: CARDIAC CATHETERIZATION: SHX172

## 2012-11-18 HISTORY — PX: CORONARY ARTERY BYPASS GRAFT: SHX141

## 2012-11-18 HISTORY — DX: Personal history of other medical treatment: Z92.89

## 2013-04-03 HISTORY — PX: CARDIAC CATHETERIZATION: SHX172

## 2013-09-20 HISTORY — PX: STERNOTOMY: SHX1057

## 2016-09-01 ENCOUNTER — Emergency Department (HOSPITAL_COMMUNITY): Payer: Medicare Other

## 2016-09-01 ENCOUNTER — Inpatient Hospital Stay (HOSPITAL_COMMUNITY)
Admission: EM | Admit: 2016-09-01 | Discharge: 2016-09-04 | DRG: 224 | Disposition: A | Discharge status: Home / Self Care | Payer: Medicare Other | Attending: Cardiology | Admitting: Cardiology

## 2016-09-01 ENCOUNTER — Encounter (HOSPITAL_COMMUNITY): Payer: Self-pay | Admitting: *Deleted

## 2016-09-01 DIAGNOSIS — I472 Ventricular tachycardia, unspecified: Secondary | ICD-10-CM

## 2016-09-01 DIAGNOSIS — K219 Gastro-esophageal reflux disease without esophagitis: Secondary | ICD-10-CM | POA: Diagnosis present

## 2016-09-01 DIAGNOSIS — I252 Old myocardial infarction: Secondary | ICD-10-CM

## 2016-09-01 DIAGNOSIS — I208 Other forms of angina pectoris: Secondary | ICD-10-CM

## 2016-09-01 DIAGNOSIS — I2089 Other forms of angina pectoris: Secondary | ICD-10-CM | POA: Insufficient documentation

## 2016-09-01 DIAGNOSIS — Z8619 Personal history of other infectious and parasitic diseases: Secondary | ICD-10-CM

## 2016-09-01 DIAGNOSIS — I255 Ischemic cardiomyopathy: Secondary | ICD-10-CM | POA: Diagnosis present

## 2016-09-01 DIAGNOSIS — I2510 Atherosclerotic heart disease of native coronary artery without angina pectoris: Secondary | ICD-10-CM | POA: Diagnosis present

## 2016-09-01 DIAGNOSIS — E1169 Type 2 diabetes mellitus with other specified complication: Secondary | ICD-10-CM

## 2016-09-01 DIAGNOSIS — Z95810 Presence of automatic (implantable) cardiac defibrillator: Secondary | ICD-10-CM

## 2016-09-01 DIAGNOSIS — E785 Hyperlipidemia, unspecified: Secondary | ICD-10-CM

## 2016-09-01 DIAGNOSIS — Z7982 Long term (current) use of aspirin: Secondary | ICD-10-CM

## 2016-09-01 DIAGNOSIS — I739 Peripheral vascular disease, unspecified: Secondary | ICD-10-CM | POA: Diagnosis present

## 2016-09-01 DIAGNOSIS — E1151 Type 2 diabetes mellitus with diabetic peripheral angiopathy without gangrene: Secondary | ICD-10-CM | POA: Diagnosis present

## 2016-09-01 DIAGNOSIS — Z8711 Personal history of peptic ulcer disease: Secondary | ICD-10-CM

## 2016-09-01 DIAGNOSIS — I5043 Acute on chronic combined systolic (congestive) and diastolic (congestive) heart failure: Secondary | ICD-10-CM | POA: Diagnosis not present

## 2016-09-01 DIAGNOSIS — E7800 Pure hypercholesterolemia, unspecified: Secondary | ICD-10-CM | POA: Diagnosis present

## 2016-09-01 DIAGNOSIS — F17200 Nicotine dependence, unspecified, uncomplicated: Secondary | ICD-10-CM

## 2016-09-01 DIAGNOSIS — R7989 Other specified abnormal findings of blood chemistry: Secondary | ICD-10-CM

## 2016-09-01 DIAGNOSIS — I440 Atrioventricular block, first degree: Secondary | ICD-10-CM | POA: Diagnosis present

## 2016-09-01 DIAGNOSIS — I2582 Chronic total occlusion of coronary artery: Secondary | ICD-10-CM | POA: Diagnosis present

## 2016-09-01 DIAGNOSIS — Z79899 Other long term (current) drug therapy: Secondary | ICD-10-CM

## 2016-09-01 DIAGNOSIS — R079 Chest pain, unspecified: Secondary | ICD-10-CM | POA: Diagnosis not present

## 2016-09-01 DIAGNOSIS — Z951 Presence of aortocoronary bypass graft: Secondary | ICD-10-CM

## 2016-09-01 DIAGNOSIS — R011 Cardiac murmur, unspecified: Secondary | ICD-10-CM | POA: Diagnosis present

## 2016-09-01 DIAGNOSIS — I420 Dilated cardiomyopathy: Secondary | ICD-10-CM | POA: Diagnosis present

## 2016-09-01 DIAGNOSIS — Z800 Family history of malignant neoplasm of digestive organs: Secondary | ICD-10-CM

## 2016-09-01 DIAGNOSIS — R778 Other specified abnormalities of plasma proteins: Secondary | ICD-10-CM

## 2016-09-01 DIAGNOSIS — I5022 Chronic systolic (congestive) heart failure: Secondary | ICD-10-CM | POA: Clinically undetermined

## 2016-09-01 DIAGNOSIS — I200 Unstable angina: Secondary | ICD-10-CM

## 2016-09-01 DIAGNOSIS — Z792 Long term (current) use of antibiotics: Secondary | ICD-10-CM

## 2016-09-01 DIAGNOSIS — R0602 Shortness of breath: Secondary | ICD-10-CM | POA: Diagnosis not present

## 2016-09-01 DIAGNOSIS — I25110 Atherosclerotic heart disease of native coronary artery with unstable angina pectoris: Principal | ICD-10-CM | POA: Diagnosis present

## 2016-09-01 DIAGNOSIS — I25119 Atherosclerotic heart disease of native coronary artery with unspecified angina pectoris: Secondary | ICD-10-CM

## 2016-09-01 DIAGNOSIS — E875 Hyperkalemia: Secondary | ICD-10-CM | POA: Diagnosis present

## 2016-09-01 DIAGNOSIS — Z7984 Long term (current) use of oral hypoglycemic drugs: Secondary | ICD-10-CM

## 2016-09-01 DIAGNOSIS — Z9119 Patient's noncompliance with other medical treatment and regimen: Secondary | ICD-10-CM

## 2016-09-01 DIAGNOSIS — E119 Type 2 diabetes mellitus without complications: Secondary | ICD-10-CM | POA: Diagnosis not present

## 2016-09-01 DIAGNOSIS — I110 Hypertensive heart disease with heart failure: Secondary | ICD-10-CM | POA: Diagnosis present

## 2016-09-01 DIAGNOSIS — I4510 Unspecified right bundle-branch block: Secondary | ICD-10-CM | POA: Diagnosis present

## 2016-09-01 DIAGNOSIS — I5042 Chronic combined systolic (congestive) and diastolic (congestive) heart failure: Secondary | ICD-10-CM | POA: Clinically undetermined

## 2016-09-01 DIAGNOSIS — I10 Essential (primary) hypertension: Secondary | ICD-10-CM | POA: Diagnosis present

## 2016-09-01 DIAGNOSIS — J42 Unspecified chronic bronchitis: Secondary | ICD-10-CM | POA: Diagnosis present

## 2016-09-01 DIAGNOSIS — Z9049 Acquired absence of other specified parts of digestive tract: Secondary | ICD-10-CM

## 2016-09-01 HISTORY — DX: Type 2 diabetes mellitus without complications: E11.9

## 2016-09-01 HISTORY — DX: Chronic systolic (congestive) heart failure: I50.22

## 2016-09-01 HISTORY — DX: Personal history of other diseases of the musculoskeletal system and connective tissue: Z87.39

## 2016-09-01 HISTORY — DX: Hyperlipidemia, unspecified: E78.5

## 2016-09-01 HISTORY — DX: Type 2 diabetes mellitus with other specified complication: E11.69

## 2016-09-01 HISTORY — DX: Post-traumatic stress disorder, unspecified: F43.10

## 2016-09-01 HISTORY — DX: Acute myocardial infarction, unspecified: I21.9

## 2016-09-01 HISTORY — DX: Personal history of other diseases of the digestive system: Z87.19

## 2016-09-01 HISTORY — DX: Gastro-esophageal reflux disease without esophagitis: K21.9

## 2016-09-01 HISTORY — DX: Essential (primary) hypertension: I10

## 2016-09-01 HISTORY — DX: Personal history of peptic ulcer disease: Z87.11

## 2016-09-01 HISTORY — DX: Peripheral vascular disease, unspecified: I73.9

## 2016-09-01 HISTORY — DX: Personal history of other medical treatment: Z92.89

## 2016-09-01 HISTORY — DX: Ventricular tachycardia, unspecified: I47.20

## 2016-09-01 HISTORY — DX: Ischemic cardiomyopathy: I25.5

## 2016-09-01 HISTORY — DX: Other complications of anesthesia, initial encounter: T88.59XA

## 2016-09-01 HISTORY — DX: Adverse effect of unspecified anesthetic, initial encounter: T41.45XA

## 2016-09-01 HISTORY — DX: Atherosclerotic heart disease of native coronary artery with unspecified angina pectoris: I25.119

## 2016-09-01 HISTORY — DX: Presence of automatic (implantable) cardiac defibrillator: Z95.810

## 2016-09-01 HISTORY — DX: Ventricular tachycardia: I47.2

## 2016-09-01 LAB — I-STAT TROPONIN, ED
TROPONIN I, POC: 0.06 ng/mL (ref 0.00–0.08)
Troponin i, poc: 0.06 ng/mL (ref 0.00–0.08)

## 2016-09-01 LAB — CBC
HEMATOCRIT: 41.2 % (ref 39.0–52.0)
HEMOGLOBIN: 14.1 g/dL (ref 13.0–17.0)
MCH: 29.9 pg (ref 26.0–34.0)
MCHC: 34.2 g/dL (ref 30.0–36.0)
MCV: 87.5 fL (ref 78.0–100.0)
Platelets: 243 10*3/uL (ref 150–400)
RBC: 4.71 MIL/uL (ref 4.22–5.81)
RDW: 13.6 % (ref 11.5–15.5)
WBC: 13.1 10*3/uL — AB (ref 4.0–10.5)

## 2016-09-01 LAB — BASIC METABOLIC PANEL
ANION GAP: 10 (ref 5–15)
BUN: 15 mg/dL (ref 6–20)
CALCIUM: 9.6 mg/dL (ref 8.9–10.3)
CO2: 22 mmol/L (ref 22–32)
Chloride: 101 mmol/L (ref 101–111)
Creatinine, Ser: 1.3 mg/dL — ABNORMAL HIGH (ref 0.61–1.24)
GFR, EST NON AFRICAN AMERICAN: 55 mL/min — AB (ref >60)
Glucose, Bld: 244 mg/dL — ABNORMAL HIGH (ref 65–99)
POTASSIUM: 5.3 mmol/L — AB (ref 3.5–5.1)
SODIUM: 133 mmol/L — AB (ref 135–145)

## 2016-09-01 LAB — BRAIN NATRIURETIC PEPTIDE: B NATRIURETIC PEPTIDE 5: 1502.7 pg/mL — AB (ref 0.0–100.0)

## 2016-09-01 LAB — TROPONIN I: TROPONIN I: 0.08 ng/mL — AB (ref <0.03)

## 2016-09-01 MED ORDER — FAMOTIDINE 20 MG PO TABS
20.0000 mg | ORAL_TABLET | Freq: Two times a day (BID) | ORAL | Status: DC
  Administered 2016-09-02 – 2016-09-04 (×5): 20 mg via ORAL
  Filled 2016-09-01 (×5): qty 1

## 2016-09-01 MED ORDER — SODIUM CHLORIDE 0.9% FLUSH: 3.0000 mL | INTRAVENOUS | Status: DC | PRN

## 2016-09-01 MED ORDER — PANTOPRAZOLE SODIUM 40 MG PO TBEC
40.0000 mg | DELAYED_RELEASE_TABLET | Freq: Every day | ORAL | Status: DC
  Administered 2016-09-02 – 2016-09-04 (×3): 40 mg via ORAL
  Filled 2016-09-01 (×3): qty 1

## 2016-09-01 MED ORDER — ONDANSETRON HCL 4 MG/2ML IJ SOLN: 4.0000 mg | Freq: Four times a day (QID) | INTRAMUSCULAR | Status: DC | PRN

## 2016-09-01 MED ORDER — ACETAMINOPHEN 325 MG PO TABS
650.0000 mg | ORAL_TABLET | ORAL | Status: DC | PRN
  Administered 2016-09-03: 650 mg via ORAL
  Filled 2016-09-01: qty 2

## 2016-09-01 MED ORDER — INSULIN ASPART 100 UNIT/ML ~~LOC~~ SOLN
0.0000 [IU] | Freq: Three times a day (TID) | SUBCUTANEOUS | Status: DC
  Administered 2016-09-02 (×2): 3 [IU] via SUBCUTANEOUS
  Administered 2016-09-02: 2 [IU] via SUBCUTANEOUS
  Administered 2016-09-03: 3 [IU] via SUBCUTANEOUS
  Administered 2016-09-03: 18:00:00 2 [IU] via SUBCUTANEOUS
  Administered 2016-09-03 – 2016-09-04 (×2): 3 [IU] via SUBCUTANEOUS

## 2016-09-01 MED ORDER — HEPARIN (PORCINE) IN NACL 100-0.45 UNIT/ML-% IJ SOLN
1100.0000 [IU]/h | INTRAMUSCULAR | Status: DC
  Administered 2016-09-01: 900 [IU]/h via INTRAVENOUS
  Filled 2016-09-01: qty 250

## 2016-09-01 MED ORDER — NITROGLYCERIN 0.4 MG SL SUBL: 0.4000 mg | SUBLINGUAL_TABLET | SUBLINGUAL | Status: DC | PRN

## 2016-09-01 MED ORDER — SODIUM CHLORIDE 0.9 % WEIGHT BASED INFUSION: 1.0000 mL/kg/h | INTRAVENOUS | Status: DC

## 2016-09-01 MED ORDER — ASPIRIN EC 81 MG PO TBEC
81.0000 mg | DELAYED_RELEASE_TABLET | Freq: Every day | ORAL | Status: DC
  Administered 2016-09-02 – 2016-09-04 (×3): 81 mg via ORAL
  Filled 2016-09-01 (×4): qty 1

## 2016-09-01 MED ORDER — ASPIRIN 81 MG PO CHEW
81.0000 mg | CHEWABLE_TABLET | ORAL | Status: AC
  Administered 2016-09-02: 81 mg via ORAL
  Filled 2016-09-01: qty 1

## 2016-09-01 MED ORDER — SODIUM CHLORIDE 0.9 % WEIGHT BASED INFUSION
3.0000 mL/kg/h | INTRAVENOUS | Status: DC
  Administered 2016-09-02: 3 mL/kg/h via INTRAVENOUS

## 2016-09-01 MED ORDER — SODIUM CHLORIDE 0.9% FLUSH
3.0000 mL | Freq: Two times a day (BID) | INTRAVENOUS | Status: DC
  Administered 2016-09-01: 3 mL via INTRAVENOUS

## 2016-09-01 MED ORDER — SODIUM CHLORIDE 0.9 % IV SOLN: 250.0000 mL | INTRAVENOUS | Status: DC | PRN

## 2016-09-01 MED ORDER — METOPROLOL SUCCINATE ER 25 MG PO TB24
25.0000 mg | ORAL_TABLET | Freq: Every day | ORAL | Status: DC
  Administered 2016-09-02 – 2016-09-03 (×2): 25 mg via ORAL
  Filled 2016-09-01 (×2): qty 1

## 2016-09-01 MED ORDER — ASPIRIN EC 325 MG PO TBEC
325.0000 mg | DELAYED_RELEASE_TABLET | Freq: Once | ORAL | Status: AC
  Administered 2016-09-01: 325 mg via ORAL
  Filled 2016-09-01: qty 1

## 2016-09-01 MED ORDER — NICOTINE 21 MG/24HR TD PT24
21.0000 mg | MEDICATED_PATCH | Freq: Once | TRANSDERMAL | Status: AC
  Administered 2016-09-01: 21 mg via TRANSDERMAL
  Filled 2016-09-01: qty 1

## 2016-09-01 MED ORDER — HEPARIN BOLUS VIA INFUSION
4000.0000 [IU] | Freq: Once | INTRAVENOUS | Status: AC
  Administered 2016-09-01: 4000 [IU] via INTRAVENOUS
  Filled 2016-09-01: qty 4000

## 2016-09-01 MED ORDER — ATORVASTATIN CALCIUM 40 MG PO TABS
40.0000 mg | ORAL_TABLET | Freq: Every day | ORAL | Status: DC
  Administered 2016-09-02 – 2016-09-03 (×2): 40 mg via ORAL
  Filled 2016-09-01 (×2): qty 1

## 2016-09-01 NOTE — ED Provider Notes (Signed)
[facility]-EMERGENCY DEPT Provider Note   CSN: [id number] Arrival date & time: 09/01/16  1512     History   Chief Complaint Chief Complaint  Patient presents with  . Shortness of Breath  . Leg Swelling  . Chest Pain    HPI [name] [name] is a 70 y.o. male history of acid reflux, CAD, diabetes, and hypertension presents with central chest pain that radiates up to his throat, neck and to his shoulders for the last 2 weeks. Patient states that his pain is only exacerbated by food or liquid, usually in the mornings, worsening, sharp, 7 out of 10 pain. Patient admits to decreased intake of food because of this. Patient reports having had a coronary artery bypass graft in 2015 at the [place] and has not had follow-up since his surgery. Patient reports associated diaphoresis and lightheadedness when these symptoms occur. Patient reports being seen every 3 months to his primary care physician at the [facility]. patient admits to increase shortness of breath from his usual baseline. Patient admits to smoking history. Patient denies fevers, nausea, vomiting, changes in bowel movements, changes in urinary symptoms.  The history is provided by the patient and a relative.  Shortness of Breath  Associated symptoms include chest pain. Pertinent negatives include no fever, no neck pain, no vomiting and no abdominal pain.  Chest Pain   Associated symptoms include diaphoresis and shortness of breath. Pertinent negatives include no abdominal pain, no fever, no nausea and no vomiting.    Past Medical History:  Diagnosis Date  . Acid reflux   . Coronary artery disease   . Diabetes mellitus   . Hypercholesteremia   . Hypertension   . PAD (peripheral artery disease) (HCC)    s/p aortobifemoral bypass 10/2012    Patient Active Problem List   Diagnosis Date Noted  . Chest pain 09/01/2016  . Hypertension 03/03/2012  . Tobacco use disorder 03/03/2012  . Hyponatremia 09/18/2011  . Orchitis and epididymitis 09/16/2011    . DM (diabetes mellitus) (HCC) 09/16/2011  . Hyperlipidemia 09/16/2011    Past Surgical History:  Procedure Laterality Date  . CARDIAC SURGERY    . CHOLECYSTECTOMY  03/05/2012   Procedure: LAPAROSCOPIC CHOLECYSTECTOMY WITH INTRAOPERATIVE CHOLANGIOGRAM;  Surgeon: Emelia Loron, MD;  Location: [facility] OR;  Service: General;  Laterality: N/A;  . CORONARY ARTERY BYPASS GRAFT    . HERNIA REPAIR    . UMBILICAL HERNIA REPAIR  03/05/2012   Procedure: HERNIA REPAIR UMBILICAL ADULT;  Surgeon: Emelia Loron, MD;  Location: [facility] OR;  Service: General;  Laterality: N/A;       Home Medications    Prior to Admission medications   Medication Sig Start Date End Date Taking? Authorizing Provider  albuterol (PROVENTIL HFA) 108 (90 Base) MCG/ACT inhaler Inhale 1-2 puffs into the lungs every 6 (six) hours as needed for wheezing or shortness of breath.   Yes Historical Provider, MD  aspirin EC 81 MG tablet Take 81 mg by mouth daily.     Yes Historical Provider, MD  atorvastatin (LIPITOR) 80 MG tablet Take 40 mg by mouth at bedtime.   Yes Historical Provider, MD  Cholecalciferol (VITAMIN D-3 PO) Take 2,000 mg by mouth daily.    Yes Historical Provider, MD  dicloxacillin (DYNAPEN) 250 MG capsule Take 500 mg by mouth 3 (three) times daily.   Yes Historical Provider, MD  fluticasone (FLONASE ALLERGY RELIEF) 50 MCG/ACT nasal spray Place 2 sprays into both nostrils daily as needed for allergies or rhinitis.   Yes Historical  Provider, MD  glipiZIDE (GLUCOTROL) 5 MG tablet Take 2.5 mg by mouth daily before breakfast.   Yes Historical Provider, MD  metFORMIN (GLUCOPHAGE) 1000 MG tablet Take 1,000 mg by mouth 2 (two) times daily with a meal.   Yes Historical Provider, MD  metoprolol tartrate (LOPRESSOR) 25 MG tablet Take 25 mg by mouth 2 (two) times daily.   Yes Historical Provider, MD  naproxen (NAPROSYN) 250 MG tablet Take 250 mg by mouth at bedtime.   Yes Historical Provider, MD  omeprazole (PRILOSEC) 20 MG  capsule Take 20 mg by mouth daily.   Yes Historical Provider, MD  ranitidine (ZANTAC) 150 MG tablet Take 150 mg by mouth daily as needed for heartburn.     Historical Provider, MD  simvastatin (ZOCOR) 80 MG tablet Take 40 mg by mouth at bedtime.      Historical Provider, MD    Family History Family History  Problem Relation Age of Onset  . Cancer Brother     esophageal    Social History Social History  Substance Use Topics  . Smoking status: Current Every Day Smoker    Packs/day: 1.00  . Smokeless tobacco: Never Used  . Alcohol use No     Allergies   Patient has no known allergies.   Review of Systems Review of Systems  Constitutional: Positive for appetite change and diaphoresis. Negative for fever.  Eyes: Negative for visual disturbance.  Respiratory: Positive for shortness of breath.   Cardiovascular: Positive for chest pain.  Gastrointestinal: Negative for abdominal distention, abdominal pain, diarrhea, nausea and vomiting.  Genitourinary: Negative for difficulty urinating and dysuria.  Musculoskeletal: Negative for neck pain and neck stiffness.  Skin: Negative for wound.  Neurological: Negative for speech difficulty.  Psychiatric/Behavioral: Negative for agitation.     Physical Exam Updated Vital Signs BP 108/78 (BP Location: Right Arm)   Pulse 88   Temp 98.2 F (36.8 C)   Resp 18   Ht 5\' 6"  (1.676 m)   Wt 78.1 kg   SpO2 93%   BMI 27.78 kg/m   Physical Exam  Constitutional: He is oriented to person, place, and time. He appears well-developed and well-nourished.  HENT:  Head: Normocephalic and atraumatic.  Nose: Nose normal.  Mouth/Throat: Oropharynx is clear and moist.  Eyes: Conjunctivae and EOM are normal. Pupils are equal, round, and reactive to light.  Neck: Normal range of motion. Neck supple.  Cardiovascular: Normal rate and normal pulses.  Exam reveals gallop.   Murmur heard. Pulmonary/Chest: Effort normal. No respiratory distress. He has  wheezes. He exhibits no tenderness.  Abdominal: Soft. Bowel sounds are normal. There is no tenderness. There is no rebound and no guarding.  Musculoskeletal: Normal range of motion. He exhibits edema (Pedal edema bilaterally, 1+).  Neurological: He is alert and oriented to person, place, and time. No sensory deficit. Coordination normal.  Cranial Nerves:  III,IV, VI: ptosis not present, extra-ocular movements intact bilaterally, direct and consensual pupillary light reflexes intact bilaterally V: facial sensation, jaw opening, and bite strength equal bilaterally VII: eyebrow raise, eyelid close, smile, frown, pucker equal bilaterally VIII: hearing grossly normal bilaterally  IX,X: palate elevation and swallowing intact XI: bilateral shoulder shrug and lateral head rotation equal and strong XII: midline tongue extension  Skin: Skin is warm. Capillary refill takes less than 2 seconds.  Psychiatric: He has a normal mood and affect. His behavior is normal.  Nursing note and vitals reviewed.    ED Treatments / Results  Labs (all  labs ordered are listed, but only abnormal results are displayed) Labs Reviewed  BASIC METABOLIC PANEL - Abnormal; Notable for the following:       Result Value   Sodium 133 (*)    Potassium 5.3 (*)    Glucose, Bld 244 (*)    Creatinine, Ser 1.30 (*)    GFR calc non Af Amer 55 (*)    All other components within normal limits  CBC - Abnormal; Notable for the following:    WBC 13.1 (*)    All other components within normal limits  BRAIN NATRIURETIC PEPTIDE - Abnormal; Notable for the following:    B Natriuretic Peptide 1,502.7 (*)    All other components within normal limits  HEPARIN LEVEL (UNFRACTIONATED)  CBC  TROPONIN I  TROPONIN I  TROPONIN I  BASIC METABOLIC PANEL  LIPID PANEL  I-STAT TROPOININ, ED  I-STAT TROPOININ, ED    EKG  EKG Interpretation  Date/Time:  Wednesday September 01 2016 15:18:02 EST Ventricular Rate:  83 PR Interval:  268 QRS  Duration: 152 QT Interval:  412 QTC Calculation: 484 R Axis:   -71 Text Interpretation:  Sinus rhythm with 1st degree A-V block with occasional Premature ventricular complexes Left axis deviation Right bundle branch block Left ventricular hypertrophy with QRS widening and repolarization abnormality Inferior infarct , age undetermined Abnormal ECG No old tracing to compare Confirmed by Operating Room Services MD, Barbara Cower [phone]) on 09/01/2016 3:53:54 PM       Radiology Dg Chest 2 View  Result Date: 09/01/2016 CLINICAL DATA:  Intermittent chest pain and shortness of breath for the past 2 weeks. The pain radiates into the shoulders and down to the elbows. History of CABG, gastroesophageal reflux, current smoker. EXAM: CHEST  2 VIEW COMPARISON:  None in PACs FINDINGS: The lungs are well-expanded. There is mild hemidiaphragm flattening. The interstitial markings are coarse. There is no alveolar infiltrate, pleural effusion, or pneumothorax. The heart is top-normal in size. The pulmonary vascularity is not engorged. There is calcification in the wall of the aortic arch.The patient has undergone previous CABG. There are supplemental plate and screw fixation devices over the sternum. The thoracic vertebral bodies are preserved in height where visualized. IMPRESSION: Chronic bronchitic changes. Previous CABG. No pneumonia nor pulmonary edema. Thoracic aortic atherosclerosis. Electronically Signed   By: David  [place] M.D.   On: 09/01/2016 16:04    Procedures Procedures (including critical care time)  Medications Ordered in ED Medications  pantoprazole (PROTONIX) EC tablet 40 mg (not administered)  famotidine (PEPCID) tablet 20 mg (not administered)  aspirin EC tablet 81 mg (not administered)  atorvastatin (LIPITOR) tablet 40 mg (not administered)  metoprolol succinate (TOPROL-XL) 24 hr tablet 25 mg (not administered)  nitroGLYCERIN (NITROSTAT) SL tablet 0.4 mg (not administered)  acetaminophen (TYLENOL) tablet 650 mg  (not administered)  ondansetron (ZOFRAN) injection 4 mg (not administered)  sodium chloride flush (NS) 0.9 % injection 3 mL (not administered)  sodium chloride flush (NS) 0.9 % injection 3 mL (not administered)  0.9 %  sodium chloride infusion (not administered)  aspirin chewable tablet 81 mg (not administered)  insulin aspart (novoLOG) injection 0-15 Units (not administered)  0.9% sodium chloride infusion (not administered)    Followed by  0.9% sodium chloride infusion (not administered)  heparin ADULT infusion 100 units/mL (25000 units/224mL sodium chloride 0.45%) (900 Units/hr Intravenous Transfusing/Transfer 09/01/16 2100)  nicotine (NICODERM CQ - dosed in mg/24 hours) patch 21 mg (21 mg Transdermal Patch Applied 09/01/16 2058)  aspirin EC tablet 325  mg (325 mg Oral Given 09/01/16 1750)  heparin bolus via infusion 4,000 Units (4,000 Units Intravenous Bolus from Bag 09/01/16 2043)     Initial Impression / Assessment and Plan / ED Course  I have reviewed the triage vital signs and the nursing notes.  Pertinent labs & imaging results that were available during my care of the patient were reviewed by me and considered in my medical decision making (see chart for details).  Clinical Course   Patient is a 70 year old male presenting with central chest pain that radiates up towards his throat and into his shoulders bilaterally whenever he eats or drinks for 2 weeks. On exam patient is afebrile, vital signs stable, in no apparent distress. Murmur and gallop heard on exam. Wheeze heard on lung exam, the patient in no respiratory distress, and effort normal. Abdomen soft and nontender. Negative neuro exam. Slight pedal edema noted bilaterally. Changes in labs from previous findings include Potassium is slightly elevated to 5.3, creatinine elevated to 1.03, a GFR is decreased to 55, and white count 13.1. BNP at 1502.  Troponin I at 0.06. EKG shows possible STEMI with no previous EKGs to compare to.    Aspirin 325 mg given. Cardiology called.  18:00 Cardiology came down to see patient and does not think that patient is having a STEMI at this time.   Cardiology will admit patient for further evaluation.   Final Clinical Impressions(s) / ED Diagnoses   Final diagnoses:  None    New Prescriptions Current Discharge Medication List       [address]Cyann Venti [place], [place] 09/01/16 [address]2251    Tanijah Morais [place], [place] 09/01/16 2254    Canary Brimhristopher J Tegeler, MD 09/02/16 1401

## 2016-09-01 NOTE — ED Notes (Signed)
Paged Dr. Herbie Baltimore to [phone]

## 2016-09-01 NOTE — Progress Notes (Signed)
I have filled out record request form and gave to ED nurse. Cath is pending for tomorrow late afternoon, hopefully we will obtain record by then from [place] in [place] Pager: [phone]

## 2016-09-01 NOTE — ED Notes (Signed)
Attempted to call report

## 2016-09-01 NOTE — ED Notes (Signed)
Patient returned from X-ray 

## 2016-09-01 NOTE — ED Notes (Signed)
Medical record release faxed to [facility] by Diplomatic Services operational officer.

## 2016-09-01 NOTE — ED Notes (Signed)
Cardiology at bedside.

## 2016-09-01 NOTE — H&P (Signed)
[name] ID: [name] [name] MRN: [id number], DOB/AGE: 68/28/1947   Admit date: 09/01/2016   Primary Physician: Charolett Bumpers, PA-C Primary Cardiologist: new - previously [facility] (no followup since 2015)  Pt. Profile:  [name] [name] is 70 yo Caucasian male with PMH of HTN, HLD, DM II, GERD, PAD and CAD s/p 3v CABG presented with dyspnea on exertion, diaphoresis and burning sensation in [name] chest.  Problem List  Past Medical History:  Diagnosis Date  . Acid reflux   . Coronary artery disease   . Diabetes mellitus   . Hypercholesteremia   . Hypertension     Past Surgical History:  Procedure Laterality Date  . CARDIAC SURGERY    . CHOLECYSTECTOMY  03/05/2012   Procedure: LAPAROSCOPIC CHOLECYSTECTOMY WITH INTRAOPERATIVE CHOLANGIOGRAM;  Surgeon: Emelia Loron, MD;  Location: [facility] OR;  Service: General;  Laterality: N/A;  . CORONARY ARTERY BYPASS GRAFT    . HERNIA REPAIR    . UMBILICAL HERNIA REPAIR  03/05/2012   Procedure: HERNIA REPAIR UMBILICAL ADULT;  Surgeon: Emelia Loron, MD;  Location: [facility] OR;  Service: General;  Laterality: N/A;     Allergies  Allergies  Allergen Reactions  . Neosporin [Neomycin-Polymyxin-Gramicidin] Swelling and Rash    HPI  [name] [name] is 70 yo Caucasian male with PMH of HTN, HLD, DM II, GERD, PAD and CAD s/p 3v CABG. [name] is a poor historian, majority of his history was obtained from his family member. Apparently, he underwent up aortobifemoral bypass procedure for lower extremity PAD in February 2014, he did not have any stress test prior to [name] surgery. After surgery during recovery period, he had episode of VT which prompted further cardiac workup and eventually led to [name] cardiac catheterization and also [name] bypass surgery in March 2014 at [facility] in [place]. A few months after [name] bypass surgery, he did have a cardiac catheterization around July 2014 due to concern of graft stenosis. Due to nonhealing sternal wound and a staph  infection, he underwent sternal steel plating placement in early 2015. He has been placed on lifelong dicloxacillin since then. He is unaware of any angina symptom prior to [name] bypass surgery other than severe dizziness and diaphoresis associated with with VT episode. Unfortunately, since 2015 after his surgery was sternal plating, he has failed to follow-up with surgery or cardiology since. He has not seen by his vascular surgeon since previous surgery either.   According to his daughter, he has a long-standing history of acid reflux. Recently however he has been noticing increasing symptoms and also shortness of breath with exertion. He denies significant leg pain with walking. However, in [name] last few days he has been having prolonged episode of burning sensation in [name] chest associated with diaphoresis that is reminiscent of [name] previous symptom prior to CABG. He is not sure if this is related to acid reflux or his cardiac issue. Eventually his daughter convinced him to seek medical attention at [facility] on 09/01/2016. His baseline EKG is severely abnormal with left bundle branch block of unknown duration. He is on aware of any history of bundle branch block. Serial troponin is negative 2.   Home Medications  Prior to Admission medications   Medication Sig Start Date End Date Taking? Authorizing Provider  ALPHA LIPOIC ACID PO Take 100 mg by mouth 2 (two) times daily.    Historical Provider, MD  aspirin EC 81 MG tablet Take 81 mg by mouth daily.      Historical Provider,  MD  Cholecalciferol (VITAMIN D-3 PO) Take 2 tablets by mouth daily.    Historical Provider, MD  glipiZIDE (GLUCOTROL XL) 2.5 MG 24 hr tablet Take 2.5 mg by mouth daily.      Historical Provider, MD  hydrochlorothiazide (HYDRODIURIL) 25 MG tablet Take 25 mg by mouth daily.      Historical Provider, MD  metFORMIN (GLUCOPHAGE) 500 MG tablet Take 500 mg by mouth 2 (two) times daily with a meal.      Historical Provider, MD    niacin (NIASPAN) 500 MG CR tablet Take 1,000 mg by mouth at bedtime.      Historical Provider, MD  Omega-3 Fatty Acids (FISH OIL PO) Take 4 capsules by mouth 2 (two) times daily.      Historical Provider, MD  omeprazole (PRILOSEC) 20 MG capsule Take 20 mg by mouth daily.    Historical Provider, MD  ranitidine (ZANTAC) 150 MG tablet Take 150 mg by mouth 2 (two) times daily.    Historical Provider, MD  simvastatin (ZOCOR) 80 MG tablet Take 40 mg by mouth at bedtime.      Historical Provider, MD    Family History  Family History  Problem Relation Age of Onset  . Cancer Brother     esophageal    Social History  Social History   Social History  . Marital status: Married    Spouse name: N/A  . Number of children: N/A  . Years of education: N/A   Occupational History  . Not on file.   Social History Main Topics  . Smoking status: Current Every Day Smoker    Packs/day: 1.00  . Smokeless tobacco: Never Used  . Alcohol use No  . Drug use: No  . Sexual activity: Not on file   Other Topics Concern  . Not on file   Social History Narrative  . No narrative on file     Review of Systems General:  No chills, fever, night sweats or weight changes.  +diaphoresis Cardiovascular:  No edema, orthopnea, palpitations, paroxysmal nocturnal dyspnea. + chest pain, dyspnea on exertion Dermatological: No rash, lesions/masses Respiratory: No cough +dyspnea Urologic: No hematuria, dysuria Abdominal:   No nausea, vomiting, diarrhea, bright red blood per rectum, melena, or hematemesis Neurologic:  No visual changes, wkns, changes in mental status. All other systems reviewed and are otherwise negative except as noted above.  Physical Exam  Blood pressure 115/82, pulse 85, temperature (!) 96.8 F (36 C), temperature source Temporal, resp. rate 22, height 5\' 6"  (1.676 m), weight 168 lb (76.2 kg), SpO2 100 %.  General: Pleasant, NAD Psych: Normal affect. Neuro: Alert and oriented X 3. Moves  all extremities spontaneously. HEENT: Normal  Neck: Supple without bruits or JVD. Lungs:  Resp regular and unlabored, CTA. Heart: RRR no s3, s4, or murmurs. Occasionally skipped beat Abdomen: Soft, non-tender, non-distended, BS + x 4.  Extremities: No clubbing, cyanosis. DP/PT/Radials 2+ and equal bilaterally. Trace edema  Labs  Troponin (Point of Care Test)  Recent Labs  09/01/16 1801  TROPIPOC 0.06   No results for input(s): CKTOTAL, CKMB, TROPONINI in [name] last 72 hours. Lab Results  Component Value Date   WBC 13.1 (H) 09/01/2016   HGB 14.1 09/01/2016   HCT 41.2 09/01/2016   MCV 87.5 09/01/2016   PLT 243 09/01/2016    Recent Labs Lab 09/01/16 1521  NA 133*  K 5.3*  CL 101  CO2 22  BUN 15  CREATININE 1.30*  CALCIUM 9.6  GLUCOSE  244*   No results found for: CHOL, HDL, LDLCALC, TRIG No results found for: DDIMER   Radiology/Studies  Dg Chest 2 View  Result Date: 09/01/2016 CLINICAL DATA:  Intermittent chest pain and shortness of breath for [name] past 2 weeks. [name] pain radiates into [name] shoulders and down to [name] elbows. History of CABG, gastroesophageal reflux, current smoker. EXAM: CHEST  2 VIEW COMPARISON:  None in PACs FINDINGS: [name] lungs are well-expanded. There is mild hemidiaphragm flattening. [name] interstitial markings are coarse. There is no alveolar infiltrate, pleural effusion, or pneumothorax. [name] heart is top-normal in size. [name] pulmonary vascularity is not engorged. There is calcification in [name] wall of [name] aortic arch.[name] [name] has undergone previous CABG. There are supplemental plate and screw fixation devices over [name] sternum. [name] thoracic vertebral bodies are preserved in height where visualized. IMPRESSION: Chronic bronchitic changes. Previous CABG. No pneumonia nor pulmonary edema. Thoracic aortic atherosclerosis. Electronically Signed   By: David  [place] M.D.   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 Chest pain with DOE/ Progressive (Atypical) Angina: [name] says he usually notices his symptom in [name] morning with food, however he is a very poor historian. His previous coronary anatomy is unknown. His dyspnea on exertion is very concerning for progression of coronary artery disease & Progressive Angina. After discussing with Dr. Herbie Baltimore, Myoview would not be a good option in this case. We have discussed with [name] [name] benefit and risk of associated with cardiac catheterization for definitive assessment of coronary anatomy, he is agreeable.  - Will need to request record from [facility] hospital in AM  - Will arrange for cardiac catheterization for tomorrow afternoon. Nothing by mouth past midnight.  2. CAD s/p CABG x 3: unknown anatomy, he does not appear to have obvious angina prior to [name] event, was found after he went into VT episode after lower extremity bypass surgery. After his 2014 bypass surgery, he underwent sternal plating surgery in 2015 for nonhealing wound and a staph infection. He currently require lifelong dicloxacillin.  3. PAD s/p aortobifemoral bypass surgery 2014  4. HTN: Blood pressure medication include metoprolol and Diovan, his blood pressure is well-controlled.  5. HLD: Currently on Lipitor 40 mg daily  6. DM II: Non-insulin-dependent, on glipizide and metformin at home.  Signed, Azalee Course, PA-C 09/01/2016, 6:50 PM   I have seen, examined and evaluated [name] [name]  this PM in [name] ER along with Mr. Lisabeth Devoid, [place].  After reviewing all [name] available data and chart, we discussed [name] patients laboratory, study & physical findings as well as symptoms in detail. I agree with his findings, examination as well as impression recommendations as per our discussion.    This is a very difficult situation with [name] gentleman who has type 2 diabetes and PAD with surreptitious finding of multivessel coronary disease after suffering what seems to be VF or VT arrest shortly postop from  his aortobifem surgery back in 2014. It would appear that he had his vascular surgery done without having had cardiac testing.  By [name] daughter's report, he had his aortobifem surgery and perioperatively had what amounts to be an MI. She reports him being in his room feeling a sense of flush heat with diaphoresis uneasy sensation in his chest and dyspnea. [name] dissection covered his chest felt like heartburn they came up of over him like a wave. Lots of people came running into [name] room and he required version or defibrillation out of either VT or VF. This led to heart catheterization showing multivessel disease and eventually CABG. It appears that shortly after his CABG she had a relook catheterization showing patent grafts. Apparently he was concerned that a graft had shut down, ostensibly with a stress test that was abnormal. We are hoping to get [name] results of all of these studies and operative notes. In 2015 he had some double revision surgery to his sternotomy with a metal plate placed in his chest. Shortly thereafter that he was supposed to have an echocardiogram that was canceled and he never followed back up with cardiology. He is being followed routinely by a primary provider in [name] system.  As far as I can tell, he has been relatively asymptomatic but has noted over [name] last month or so being more dyspneic than usual. He is recovering from a cold a couple weeks ago and just has never bounced back.  He is just much more tired than usual and short of breath with doing activity. He is also noticing that burning sensation in his chest with a sweating and diaphoresis that he had prior to his eventual CABG.  I'm concerned that these symptoms with his progressively worsening dyspnea and atypical symptoms are equivalent to progressive angina, albeit atypical angina. I'm leery of doing a nuclear stress test on gentleman with a metal plate in his chest, who had a heart catheterization after his last stress test. I think this would only lead us to get again mother catheterization.  After discussing it with [name] [name], his daughter and Mr. Lisabeth DevoidMeng, we felt like [name] best option will be to go for definitive evaluation of his anatomy with a heart catheterization.  He will rule out for MI with cardiac biomarkers. He is not actively having chest discomfort, but his EKG is somewhat concerning with a bundle branch block and some ST depressions. Not a STEMI, but not normal. In [name] absence of active chest pain I think we can wait until tomorrow to go to [name] Cath Lab. If he has any recurrence of symptoms, I would start him on heparin.  We do need to get his anatomy, if nothing else that post CABG Report will be very helpful.  Having his preop and postop cath report as well as CABG report would be [name] best. Would also like to know his EF by echo, he has not had one since 2014, we will recheck 1 while he is here.  He is on aspirin, statin, metoprolol and Diovan at home. [name] fact he is not on diuretic which suggests he does not have it diagnosis of heart failure.    Bryan Lemmaavid Harding, M.D., M.S. Interventional Cardiologist   Pager # [phone] Phone # [phone] [address] [place], [place] [id number]

## 2016-09-01 NOTE — ED Triage Notes (Signed)
Pt reports every time he eats, he gets burning chest discomfort, diaphoretic and felt lgithheaded. Had recent cold symptoms and still feeling SOB. Reports recent increase in leg swelling.

## 2016-09-01 NOTE — Progress Notes (Signed)
ANTICOAGULATION CONSULT NOTE  Pharmacy Consult for heparin Indication: chest pain/ACS  Heparin Dosing Weight: 76.2 kg   Assessment: 70 yom with CP on admit. Pharmacy consulted to does heparin for ACS. Not on anticoagulation PTA. Cards following. CBC wnl, no bleed documented.  Goal of Therapy:  Heparin level 0.3-0.7 units/ml Monitor platelets by anticoagulation protocol: Yes   Plan:  Heparin 4000 unit bolus Start heparin at 900 units/h 6h heparin level Daily heparin level/CBC Monitor s/sx bleeding   Babs Bertin, PharmD, BCPS Clinical Pharmacist 09/01/2016 8:10 PM

## 2016-09-01 NOTE — ED Notes (Signed)
Patient transported to X-ray 

## 2016-09-02 ENCOUNTER — Observation Stay (HOSPITAL_COMMUNITY): Payer: Medicare Other

## 2016-09-02 ENCOUNTER — Encounter (HOSPITAL_COMMUNITY)
Admission: EM | Disposition: A | Discharge status: Home / Self Care | Payer: Self-pay | Source: Home / Self Care | Attending: Cardiology

## 2016-09-02 DIAGNOSIS — I110 Hypertensive heart disease with heart failure: Secondary | ICD-10-CM | POA: Diagnosis present

## 2016-09-02 DIAGNOSIS — I472 Ventricular tachycardia, unspecified: Secondary | ICD-10-CM

## 2016-09-02 DIAGNOSIS — R079 Chest pain, unspecified: Secondary | ICD-10-CM | POA: Diagnosis not present

## 2016-09-02 DIAGNOSIS — K219 Gastro-esophageal reflux disease without esophagitis: Secondary | ICD-10-CM | POA: Diagnosis present

## 2016-09-02 DIAGNOSIS — E875 Hyperkalemia: Secondary | ICD-10-CM

## 2016-09-02 DIAGNOSIS — I2510 Atherosclerotic heart disease of native coronary artery without angina pectoris: Secondary | ICD-10-CM | POA: Diagnosis not present

## 2016-09-02 DIAGNOSIS — I4510 Unspecified right bundle-branch block: Secondary | ICD-10-CM | POA: Diagnosis present

## 2016-09-02 DIAGNOSIS — E7800 Pure hypercholesterolemia, unspecified: Secondary | ICD-10-CM | POA: Diagnosis present

## 2016-09-02 DIAGNOSIS — I509 Heart failure, unspecified: Secondary | ICD-10-CM | POA: Diagnosis not present

## 2016-09-02 DIAGNOSIS — E1169 Type 2 diabetes mellitus with other specified complication: Secondary | ICD-10-CM | POA: Diagnosis present

## 2016-09-02 DIAGNOSIS — I5043 Acute on chronic combined systolic (congestive) and diastolic (congestive) heart failure: Secondary | ICD-10-CM | POA: Diagnosis present

## 2016-09-02 DIAGNOSIS — Z8711 Personal history of peptic ulcer disease: Secondary | ICD-10-CM | POA: Diagnosis not present

## 2016-09-02 DIAGNOSIS — I517 Cardiomegaly: Secondary | ICD-10-CM | POA: Diagnosis not present

## 2016-09-02 DIAGNOSIS — E1151 Type 2 diabetes mellitus with diabetic peripheral angiopathy without gangrene: Secondary | ICD-10-CM | POA: Diagnosis present

## 2016-09-02 DIAGNOSIS — I5022 Chronic systolic (congestive) heart failure: Secondary | ICD-10-CM | POA: Diagnosis not present

## 2016-09-02 DIAGNOSIS — Z7982 Long term (current) use of aspirin: Secondary | ICD-10-CM | POA: Diagnosis not present

## 2016-09-02 DIAGNOSIS — I252 Old myocardial infarction: Secondary | ICD-10-CM | POA: Diagnosis not present

## 2016-09-02 DIAGNOSIS — Z792 Long term (current) use of antibiotics: Secondary | ICD-10-CM | POA: Diagnosis not present

## 2016-09-02 DIAGNOSIS — Z951 Presence of aortocoronary bypass graft: Secondary | ICD-10-CM | POA: Diagnosis not present

## 2016-09-02 DIAGNOSIS — Z9119 Patient's noncompliance with other medical treatment and regimen: Secondary | ICD-10-CM | POA: Diagnosis not present

## 2016-09-02 DIAGNOSIS — F17200 Nicotine dependence, unspecified, uncomplicated: Secondary | ICD-10-CM | POA: Diagnosis present

## 2016-09-02 DIAGNOSIS — I25119 Atherosclerotic heart disease of native coronary artery with unspecified angina pectoris: Secondary | ICD-10-CM | POA: Diagnosis not present

## 2016-09-02 DIAGNOSIS — I25110 Atherosclerotic heart disease of native coronary artery with unstable angina pectoris: Secondary | ICD-10-CM | POA: Diagnosis present

## 2016-09-02 DIAGNOSIS — I255 Ischemic cardiomyopathy: Secondary | ICD-10-CM | POA: Diagnosis not present

## 2016-09-02 DIAGNOSIS — Z7984 Long term (current) use of oral hypoglycemic drugs: Secondary | ICD-10-CM | POA: Diagnosis not present

## 2016-09-02 DIAGNOSIS — R0602 Shortness of breath: Secondary | ICD-10-CM | POA: Diagnosis not present

## 2016-09-02 DIAGNOSIS — Z79899 Other long term (current) drug therapy: Secondary | ICD-10-CM | POA: Diagnosis not present

## 2016-09-02 DIAGNOSIS — R011 Cardiac murmur, unspecified: Secondary | ICD-10-CM | POA: Diagnosis present

## 2016-09-02 DIAGNOSIS — I440 Atrioventricular block, first degree: Secondary | ICD-10-CM | POA: Diagnosis present

## 2016-09-02 DIAGNOSIS — Z9049 Acquired absence of other specified parts of digestive tract: Secondary | ICD-10-CM | POA: Diagnosis not present

## 2016-09-02 HISTORY — PX: CARDIAC CATHETERIZATION: SHX172

## 2016-09-02 LAB — CBC
HEMATOCRIT: 38.8 % — AB (ref 39.0–52.0)
Hemoglobin: 13 g/dL (ref 13.0–17.0)
MCH: 28.9 pg (ref 26.0–34.0)
MCHC: 33.5 g/dL (ref 30.0–36.0)
MCV: 86.2 fL (ref 78.0–100.0)
PLATELETS: 207 10*3/uL (ref 150–400)
RBC: 4.5 MIL/uL (ref 4.22–5.81)
RDW: 13.5 % (ref 11.5–15.5)
WBC: 10.2 10*3/uL (ref 4.0–10.5)

## 2016-09-02 LAB — PROTIME-INR
INR: 1.28
Prothrombin Time: 16.1 seconds — ABNORMAL HIGH (ref 11.4–15.2)

## 2016-09-02 LAB — LIPID PANEL
CHOLESTEROL: 87 mg/dL (ref 0–200)
HDL: 26 mg/dL — ABNORMAL LOW (ref >40)
LDL Cholesterol: 43 mg/dL (ref 0–99)
TRIGLYCERIDES: 92 mg/dL (ref <150)
Total CHOL/HDL Ratio: 3.3 RATIO
VLDL: 18 mg/dL (ref 0–40)

## 2016-09-02 LAB — BASIC METABOLIC PANEL
Anion gap: 11 (ref 5–15)
BUN: 14 mg/dL (ref 6–20)
CHLORIDE: 100 mmol/L — AB (ref 101–111)
CO2: 23 mmol/L (ref 22–32)
CREATININE: 1.13 mg/dL (ref 0.61–1.24)
Calcium: 9.2 mg/dL (ref 8.9–10.3)
Glucose, Bld: 153 mg/dL — ABNORMAL HIGH (ref 65–99)
POTASSIUM: 3.9 mmol/L (ref 3.5–5.1)
SODIUM: 134 mmol/L — AB (ref 135–145)

## 2016-09-02 LAB — GLUCOSE, CAPILLARY
GLUCOSE-CAPILLARY: 167 mg/dL — AB (ref 65–99)
Glucose-Capillary: 154 mg/dL — ABNORMAL HIGH (ref 65–99)
Glucose-Capillary: 150 mg/dL — ABNORMAL HIGH (ref 65–99)
Glucose-Capillary: 219 mg/dL — ABNORMAL HIGH (ref 65–99)

## 2016-09-02 LAB — ECHOCARDIOGRAM COMPLETE
HEIGHTINCHES: 66 in
WEIGHTICAEL: 2753.6 [oz_av]

## 2016-09-02 LAB — TROPONIN I
TROPONIN I: 0.07 ng/mL — AB (ref <0.03)
Troponin I: 0.07 ng/mL (ref <0.03)

## 2016-09-02 LAB — HEPARIN LEVEL (UNFRACTIONATED): HEPARIN UNFRACTIONATED: 0.23 [IU]/mL — AB (ref 0.30–0.70)

## 2016-09-02 LAB — MRSA PCR SCREENING: MRSA BY PCR: NEGATIVE

## 2016-09-02 LAB — MAGNESIUM: MAGNESIUM: 1.3 mg/dL — AB (ref 1.7–2.4)

## 2016-09-02 SURGERY — LEFT HEART CATH AND CORS/GRAFTS ANGIOGRAPHY
Anesthesia: LOCAL

## 2016-09-02 MED ORDER — METOPROLOL TARTRATE 5 MG/5ML IV SOLN
INTRAVENOUS | Status: AC
  Administered 2016-09-02: 5 mg
  Filled 2016-09-02: qty 5

## 2016-09-02 MED ORDER — AMIODARONE HCL IN DEXTROSE 360-4.14 MG/200ML-% IV SOLN
INTRAVENOUS | Status: DC | PRN
  Administered 2016-09-02: 60 mg/h via INTRAVENOUS

## 2016-09-02 MED ORDER — LIDOCAINE HCL (CARDIAC) 20 MG/ML IV SOLN
INTRAVENOUS | Status: DC | PRN
  Administered 2016-09-02: 100 mg via INTRAVENOUS

## 2016-09-02 MED ORDER — CEFAZOLIN SODIUM-DEXTROSE 2-4 GM/100ML-% IV SOLN
2.0000 g | INTRAVENOUS | Status: AC
  Administered 2016-09-03: 2 g via INTRAVENOUS
  Filled 2016-09-02: qty 100

## 2016-09-02 MED ORDER — PROCAINAMIDE HCL 100 MG/ML IJ SOLN
500.0000 mg | Freq: Once | INTRAVENOUS | Status: AC
  Administered 2016-09-02: 500 mg via INTRAVENOUS
  Filled 2016-09-02: qty 5

## 2016-09-02 MED ORDER — AMIODARONE LOAD VIA INFUSION
150.0000 mg | Freq: Once | INTRAVENOUS | Status: AC
  Administered 2016-09-02: 150 mg via INTRAVENOUS
  Filled 2016-09-02: qty 83.34

## 2016-09-02 MED ORDER — MIDAZOLAM HCL 2 MG/2ML IJ SOLN
INTRAMUSCULAR | Status: DC | PRN
  Administered 2016-09-02: 3 mg via INTRAVENOUS
  Administered 2016-09-02: 1 mg via INTRAVENOUS

## 2016-09-02 MED ORDER — IPRATROPIUM-ALBUTEROL 0.5-2.5 (3) MG/3ML IN SOLN
3.0000 mL | Freq: Four times a day (QID) | RESPIRATORY_TRACT | Status: DC
  Administered 2016-09-02 – 2016-09-04 (×6): 3 mL via RESPIRATORY_TRACT
  Filled 2016-09-02 (×7): qty 3

## 2016-09-02 MED ORDER — IOPAMIDOL (ISOVUE-370) INJECTION 76%
INTRAVENOUS | Status: AC
  Filled 2016-09-02: qty 125

## 2016-09-02 MED ORDER — ADENOSINE 6 MG/2ML IV SOLN
INTRAVENOUS | Status: DC | PRN
  Administered 2016-09-02: 12 mg via INTRAVENOUS

## 2016-09-02 MED ORDER — FUROSEMIDE 10 MG/ML IJ SOLN
40.0000 mg | Freq: Once | INTRAMUSCULAR | Status: AC
  Administered 2016-09-02: 40 mg via INTRAVENOUS

## 2016-09-02 MED ORDER — FLUTICASONE PROPIONATE 50 MCG/ACT NA SUSP
2.0000 | Freq: Every day | NASAL | Status: DC
  Filled 2016-09-02: qty 16

## 2016-09-02 MED ORDER — FENTANYL CITRATE (PF) 100 MCG/2ML IJ SOLN
INTRAMUSCULAR | Status: DC | PRN
  Administered 2016-09-02: 25 ug via INTRAVENOUS

## 2016-09-02 MED ORDER — SODIUM CHLORIDE 0.9 % IV SOLN
INTRAVENOUS | Status: DC | PRN
  Administered 2016-09-02: 500 mL via INTRAVENOUS

## 2016-09-02 MED ORDER — AMIODARONE HCL IN DEXTROSE 360-4.14 MG/200ML-% IV SOLN
INTRAVENOUS | Status: AC
  Filled 2016-09-02: qty 200

## 2016-09-02 MED ORDER — LIDOCAINE HCL (PF) 1 % IJ SOLN
INTRAMUSCULAR | Status: AC
  Filled 2016-09-02: qty 30

## 2016-09-02 MED ORDER — DICLOXACILLIN SODIUM 500 MG PO CAPS
500.0000 mg | ORAL_CAPSULE | Freq: Three times a day (TID) | ORAL | Status: DC
  Administered 2016-09-02 – 2016-09-04 (×6): 500 mg via ORAL
  Filled 2016-09-02 (×7): qty 1

## 2016-09-02 MED ORDER — HEPARIN (PORCINE) IN NACL 100-0.45 UNIT/ML-% IJ SOLN
1100.0000 [IU]/h | INTRAMUSCULAR | Status: DC
Start: 2016-09-03 — End: 2016-09-03
  Administered 2016-09-03: 1100 [IU]/h via INTRAVENOUS
  Filled 2016-09-02: qty 250

## 2016-09-02 MED ORDER — MAGNESIUM SULFATE 4 GM/100ML IV SOLN
4.0000 g | Freq: Once | INTRAVENOUS | Status: AC
  Administered 2016-09-02: 4 g via INTRAVENOUS
  Filled 2016-09-02: qty 100

## 2016-09-02 MED ORDER — FUROSEMIDE 10 MG/ML IJ SOLN
40.0000 mg | Freq: Two times a day (BID) | INTRAMUSCULAR | Status: DC
  Administered 2016-09-02 – 2016-09-03 (×4): 40 mg via INTRAVENOUS
  Filled 2016-09-02 (×4): qty 4

## 2016-09-02 MED ORDER — HEPARIN (PORCINE) IN NACL 2-0.9 UNIT/ML-% IJ SOLN
INTRAMUSCULAR | Status: DC | PRN
  Administered 2016-09-02: 1000 mL

## 2016-09-02 MED ORDER — IOPAMIDOL (ISOVUE-370) INJECTION 76%
INTRAVENOUS | Status: DC | PRN
  Administered 2016-09-02: 125 mL via INTRA_ARTERIAL

## 2016-09-02 MED ORDER — MIDAZOLAM HCL 2 MG/2ML IJ SOLN
INTRAMUSCULAR | Status: AC
  Filled 2016-09-02: qty 2

## 2016-09-02 MED ORDER — LIDOCAINE HCL (CARDIAC) 20 MG/ML IV SOLN
INTRAVENOUS | Status: AC
  Filled 2016-09-02: qty 5

## 2016-09-02 MED ORDER — AMIODARONE HCL IN DEXTROSE 360-4.14 MG/200ML-% IV SOLN
60.0000 mg/h | INTRAVENOUS | Status: DC
  Administered 2016-09-02: 60 mg/h via INTRAVENOUS
  Filled 2016-09-02: qty 200

## 2016-09-02 MED ORDER — HEPARIN SODIUM (PORCINE) 1000 UNIT/ML IJ SOLN
INTRAMUSCULAR | Status: AC
  Filled 2016-09-02: qty 1

## 2016-09-02 MED ORDER — FENTANYL CITRATE (PF) 100 MCG/2ML IJ SOLN
INTRAMUSCULAR | Status: AC
  Filled 2016-09-02: qty 2

## 2016-09-02 MED ORDER — INSULIN ASPART 100 UNIT/ML ~~LOC~~ SOLN
10.0000 [IU] | Freq: Once | SUBCUTANEOUS | Status: AC
  Administered 2016-09-02: 10 [IU] via SUBCUTANEOUS

## 2016-09-02 MED ORDER — SODIUM CHLORIDE 0.9 % IV SOLN: INTRAVENOUS | Status: AC

## 2016-09-02 MED ORDER — SODIUM CHLORIDE 0.9 % IV SOLN: INTRAVENOUS | Status: DC

## 2016-09-02 MED ORDER — AMIODARONE HCL IN DEXTROSE 360-4.14 MG/200ML-% IV SOLN
INTRAVENOUS | Status: AC
  Administered 2016-09-02: 150 mg via INTRAVENOUS
  Filled 2016-09-02: qty 200

## 2016-09-02 MED ORDER — SODIUM CHLORIDE 0.9% FLUSH: 3.0000 mL | INTRAVENOUS | Status: DC | PRN

## 2016-09-02 MED ORDER — FLUTICASONE PROPIONATE 50 MCG/ACT NA SUSP
2.0000 | Freq: Every day | NASAL | Status: DC | PRN
  Filled 2016-09-02: qty 16

## 2016-09-02 MED ORDER — CHLORHEXIDINE GLUCONATE 4 % EX LIQD
60.0000 mL | Freq: Once | CUTANEOUS | Status: AC
  Administered 2016-09-03: 4 via TOPICAL
  Filled 2016-09-02 (×2): qty 60

## 2016-09-02 MED ORDER — DEXTROSE 50 % IV SOLN
INTRAVENOUS | Status: AC
  Administered 2016-09-02: 50 mL via INTRAVENOUS
  Filled 2016-09-02: qty 50

## 2016-09-02 MED ORDER — SODIUM CHLORIDE 0.9 % IV SOLN
1.0000 g | Freq: Once | INTRAVENOUS | Status: AC
  Administered 2016-09-02: 1 g via INTRAVENOUS
  Filled 2016-09-02: qty 10

## 2016-09-02 MED ORDER — AMIODARONE LOAD VIA INFUSION
INTRAVENOUS | Status: DC | PRN
  Administered 2016-09-02: 150 mg via INTRAVENOUS

## 2016-09-02 MED ORDER — LORAZEPAM 2 MG/ML IJ SOLN
0.5000 mg | Freq: Once | INTRAMUSCULAR | Status: AC
  Administered 2016-09-02: 0.5 mg via INTRAVENOUS
  Filled 2016-09-02: qty 1

## 2016-09-02 MED ORDER — FUROSEMIDE 10 MG/ML IJ SOLN
INTRAMUSCULAR | Status: AC
  Administered 2016-09-02: 40 mg
  Filled 2016-09-02: qty 4

## 2016-09-02 MED ORDER — AMIODARONE HCL IN DEXTROSE 360-4.14 MG/200ML-% IV SOLN: 30.0000 mg/h | INTRAVENOUS | Status: DC

## 2016-09-02 MED ORDER — CHLORHEXIDINE GLUCONATE 4 % EX LIQD
60.0000 mL | Freq: Once | CUTANEOUS | Status: AC
  Administered 2016-09-03: 4 via TOPICAL
  Filled 2016-09-02: qty 60

## 2016-09-02 MED ORDER — CHLORHEXIDINE GLUCONATE 4 % EX LIQD
60.0000 mL | Freq: Once | CUTANEOUS | Status: DC
  Filled 2016-09-02: qty 60

## 2016-09-02 MED ORDER — SODIUM CHLORIDE 0.9% FLUSH
3.0000 mL | Freq: Two times a day (BID) | INTRAVENOUS | Status: DC
  Administered 2016-09-02 – 2016-09-03 (×2): 3 mL via INTRAVENOUS

## 2016-09-02 MED ORDER — HEPARIN (PORCINE) IN NACL 2-0.9 UNIT/ML-% IJ SOLN
INTRAMUSCULAR | Status: AC
  Filled 2016-09-02: qty 1000

## 2016-09-02 MED ORDER — ADENOSINE 12 MG/4ML IV SOLN
INTRAVENOUS | Status: AC
  Filled 2016-09-02: qty 4

## 2016-09-02 MED ORDER — HEPARIN BOLUS VIA INFUSION
2000.0000 [IU] | Freq: Once | INTRAVENOUS | Status: AC
  Administered 2016-09-02: 2000 [IU] via INTRAVENOUS
  Filled 2016-09-02: qty 2000

## 2016-09-02 MED ORDER — LIDOCAINE IN D5W 4-5 MG/ML-% IV SOLN
INTRAVENOUS | Status: AC
  Filled 2016-09-02: qty 500

## 2016-09-02 MED ORDER — HEPARIN SODIUM (PORCINE) 1000 UNIT/ML IJ SOLN
INTRAMUSCULAR | Status: DC | PRN
  Administered 2016-09-02: 4000 [IU] via INTRAVENOUS

## 2016-09-02 MED ORDER — AMIODARONE HCL 150 MG/3ML IV SOLN
INTRAVENOUS | Status: AC
  Filled 2016-09-02: qty 3

## 2016-09-02 MED ORDER — AMIODARONE HCL IN DEXTROSE 360-4.14 MG/200ML-% IV SOLN
60.0000 mg/h | INTRAVENOUS | Status: AC
  Administered 2016-09-02: 60 mg/h via INTRAVENOUS
  Filled 2016-09-02: qty 200

## 2016-09-02 MED ORDER — FUROSEMIDE 10 MG/ML IJ SOLN
INTRAMUSCULAR | Status: AC
  Filled 2016-09-02: qty 4

## 2016-09-02 MED ORDER — SODIUM CHLORIDE 0.9 % IR SOLN
80.0000 mg | Status: AC
  Administered 2016-09-03: 80 mg
  Filled 2016-09-02: qty 2

## 2016-09-02 MED ORDER — VERAPAMIL HCL 2.5 MG/ML IV SOLN
INTRAVENOUS | Status: AC
  Filled 2016-09-02: qty 2

## 2016-09-02 MED ORDER — ORAL CARE MOUTH RINSE
15.0000 mL | Freq: Two times a day (BID) | OROMUCOSAL | Status: DC
  Administered 2016-09-02 – 2016-09-04 (×4): 15 mL via OROMUCOSAL

## 2016-09-02 MED ORDER — LIDOCAINE IN D5W 4-5 MG/ML-% IV SOLN
INTRAVENOUS | Status: DC | PRN
  Administered 2016-09-02: 1 mg/min via INTRAVENOUS

## 2016-09-02 MED ORDER — LORAZEPAM 2 MG/ML IJ SOLN
INTRAMUSCULAR | Status: AC
  Administered 2016-09-02: 0.5 mg via INTRAVENOUS
  Filled 2016-09-02: qty 1

## 2016-09-02 MED ORDER — LIDOCAINE HCL (PF) 1 % IJ SOLN
INTRAMUSCULAR | Status: DC | PRN
  Administered 2016-09-02: 2 mL

## 2016-09-02 MED ORDER — PROCAINAMIDE HCL 100 MG/ML IJ SOLN
2.0000 mg/min | INTRAVENOUS | Status: DC
  Administered 2016-09-02: 2 mg/min via INTRAVENOUS
  Filled 2016-09-02 (×2): qty 10

## 2016-09-02 MED ORDER — SODIUM CHLORIDE 0.9 % IV SOLN
250.0000 mL | INTRAVENOUS | Status: DC | PRN
  Administered 2016-09-03: 21:00:00 250 mL via INTRAVENOUS

## 2016-09-02 MED ORDER — LIDOCAINE IN D5W 4-5 MG/ML-% IV SOLN
1.0000 mg/min | INTRAVENOUS | Status: DC
  Filled 2016-09-02: qty 500

## 2016-09-02 MED ORDER — AMIODARONE HCL IN DEXTROSE 360-4.14 MG/200ML-% IV SOLN
30.0000 mg/h | INTRAVENOUS | Status: DC
  Administered 2016-09-02 – 2016-09-03 (×2): 30 mg/h via INTRAVENOUS
  Filled 2016-09-02 (×3): qty 200

## 2016-09-02 MED ORDER — ALBUTEROL SULFATE (2.5 MG/3ML) 0.083% IN NEBU: 2.5000 mg | INHALATION_SOLUTION | RESPIRATORY_TRACT | Status: DC | PRN

## 2016-09-02 MED ORDER — DEXTROSE 50 % IV SOLN
1.0000 | Freq: Once | INTRAVENOUS | Status: AC
  Administered 2016-09-02: 50 mL via INTRAVENOUS
  Filled 2016-09-02: qty 50

## 2016-09-02 SURGICAL SUPPLY — 14 items
CATH EXPO 5F IM (CATHETERS) ×1 IMPLANT
CATH INFINITI 5 FR AL2 (CATHETERS) ×1 IMPLANT
CATH INFINITI 5FR AL1 (CATHETERS) ×1 IMPLANT
CATH INFINITI 5FR MULTPACK ANG (CATHETERS) ×1 IMPLANT
DEVICE RAD COMP TR BAND LRG (VASCULAR PRODUCTS) ×2 IMPLANT
GLIDESHEATH SLEND SS 6F .021 (SHEATH) ×1 IMPLANT
GUIDEWIRE INQWIRE 1.5J.035X260 (WIRE) IMPLANT
INQWIRE 1.5J .035X260CM (WIRE) ×2
KIT HEART LEFT (KITS) ×2 IMPLANT
PACK CARDIAC CATHETERIZATION (CUSTOM PROCEDURE TRAY) ×2 IMPLANT
SYR MEDRAD MARK V 150ML (SYRINGE) ×2 IMPLANT
TRANSDUCER W/STOPCOCK (MISCELLANEOUS) ×3 IMPLANT
TUBING CIL FLEX 10 FLL-RA (TUBING) ×2 IMPLANT
WIRE EMERALD ST .035X260CM (WIRE) ×1 IMPLANT

## 2016-09-02 NOTE — Progress Notes (Addendum)
Patient Name: [name] [name] Date of Encounter: 09/02/2016  Primary Cardiologist: new - previously [facility] [name] [name] (no followup since 2015)  [name] Problem List     Principal Problem:   Progressive angina (HCC) - atypical Active Problems:   Atypical angina (HCC)   Coronary artery disease involving native coronary artery of native heart with angina pectoris (HCC)   Wide-complex tachycardia (HCC)   Diabetes mellitus type II, non insulin dependent (HCC)   Hyperlipidemia associated with type 2 diabetes mellitus (HCC)   Tobacco use disorder   PAD (peripheral artery disease) (HCC)   S/P CABG x 3   Acute on chronic heart failure (HCC)   Hyperkalemia    Subjective   Feels ok, tired. Denies chest pain and SOB.   Inpatient Medications    Scheduled Meds: . aspirin EC  81 mg Oral Daily  . atorvastatin  40 mg Oral q1800  . famotidine  20 mg Oral BID  . furosemide  40 mg Intravenous BID  . insulin aspart  0-15 Units Subcutaneous TID WC  . metoprolol succinate  25 mg Oral Daily  . nicotine  21 mg Transdermal Once  . pantoprazole  40 mg Oral Daily  . sodium chloride flush  3 mL Intravenous Q12H   Continuous Infusions: . sodium chloride 1 mL/kg/hr (09/02/16 0700)  . heparin 1,100 Units/hr (09/02/16 0346)  . procainamide (PRONESTYL) 4 MG/ML INFUSION 2 mg/min (09/02/16 0530)   PRN Meds: sodium chloride, acetaminophen, nitroGLYCERIN, ondansetron (ZOFRAN) IV, sodium chloride flush   Vital Signs    Vitals:   09/02/16 0530 09/02/16 0600 09/02/16 0700 09/02/16 0730  BP: (!) 81/61 (!) 87/66 104/85   Pulse: 80 87 85   Resp: 15 (!) 31 17   Temp:    97.6 F (36.4 C)  TempSrc:    Oral  SpO2: 100% 100% 100%   Weight:      Height:        Intake/Output Summary (Last 24 hours) at 09/02/16 0831 Last data filed at 09/02/16 0700  Gross per 24 hour  Intake           533.62 ml  Output              850 ml  Net          -316.38 ml   Filed Weights   09/01/16 1528 09/01/16 2200  Weight:  76.2 kg (168 lb) 78.1 kg (172 lb 1.6 oz)    Physical Exam   GEN: Well nourished, well developed, in no acute distress.  HEENT: Grossly normal.  Neck: Supple, no JVD, carotid bruits, or masses. Cardiac: RRR, no murmurs, rubs, or gallops. No clubbing, cyanosis, edema.  Radials/DP/PT 2+ and equal bilaterally.  Respiratory:  Respirations regular and unlabored, clear to auscultation bilaterally. GI: Soft, nontender, nondistended, BS + x 4. MS: no deformity or atrophy. Skin: warm and dry, no rash. Neuro:  Strength and sensation are intact. Psych: AAOx3.  Normal affect.  Labs    CBC  Recent Labs  09/01/16 1521 09/02/16 0308  WBC 13.1* 10.2  HGB 14.1 13.0  HCT 41.2 38.8*  MCV 87.5 86.2  PLT 243 207   Basic Metabolic Panel  Recent Labs  09/01/16 1521 09/02/16 0308  NA 133* 134*  K 5.3* 3.9  CL 101 100*  CO2 22 23  GLUCOSE 244* 153*  BUN 15 14  CREATININE 1.30* 1.13  CALCIUM 9.6 9.2  MG  --  1.3*   Cardiac Enzymes  Recent Labs  09/01/16 2246 09/02/16 0308  TROPONINI 0.08* 0.07*     Recent Labs  09/02/16 0308  CHOL 87  HDL 26*  LDLCALC 43  TRIG 92  CHOLHDL 3.3     Telemetry    NSR, frequent periods of NSVT vs. Afib w/ aberrancy - Personally Reviewed [name] [name])  ECG    Polymorphic VT vs. Afib with aberrancy; Difficult to interpret - Personally Reviewed along with EP -- cannot exclude salvos of VT or PAF/PAT with aberrancy given underlying RBBB [name] [name])  Radiology    Dg Chest 2 View  Result Date: 09/01/2016 CLINICAL DATA:  Intermittent chest pain and shortness of breath for the past 2 weeks. The pain radiates into the shoulders and down to the elbows. History of CABG, gastroesophageal reflux, current smoker. EXAM: CHEST  2 VIEW COMPARISON:  None in PACs FINDINGS: The lungs are well-expanded. There is mild hemidiaphragm flattening. The interstitial markings are coarse. There is no alveolar infiltrate, pleural effusion, or pneumothorax. The heart is top-normal  in size. The pulmonary vascularity is not engorged. There is calcification in the wall of the aortic arch.The patient has undergone previous CABG. There are supplemental plate and screw fixation devices over the sternum. The thoracic vertebral bodies are preserved in height where visualized. IMPRESSION: Chronic bronchitic changes. Previous CABG. No pneumonia nor pulmonary edema. Thoracic aortic atherosclerosis. Electronically Signed   By: [name] Standlee  [name] M.D.   On: 09/01/2016 16:04     Patient Profile     [name] [name] is a 70 year old male with a past medical history of HTN, HLD, DM II, GERD, PAD and CAD s/p 3v CABG presented with dyspnea on exertion, diaphoresis and burning sensation in the chest.  Assessment & Plan    Principal Problem:   Progressive angina (HCC) - atypical Active Problems:   Atypical angina (HCC)   Coronary artery disease involving native coronary artery of native heart with angina pectoris (HCC)   Wide-complex tachycardia (HCC)   Diabetes mellitus type II, non insulin dependent (HCC)   Hyperlipidemia associated with type 2 diabetes mellitus (HCC)   Tobacco use disorder   PAD (peripheral artery disease) (HCC)   S/P CABG x 3   Acute on chronic heart failure (HCC)   Hyperkalemia  Progressive Angina (atypical)-- mild troponin elevation with concerning rhythms o/n; Need to exclude ischemic etiology (? If this is the etiology of his pre-CABG arrhythmia..  IV Heparin  On ASA & statin + BB  Wide complex tachycardia: Overnight patient had frequent episodes of WCT (? VT vs. Afib w/ aberrancy), he was hemodynamically stable, but symptomatic with diaphoresis and palpitations. He was given IV metoprolol and a amiodarone bolus. However, he developed worsening tachycardia, there was some concern of an accessory pathway, so he was started on procainamide. Will ask for EP consult. (curbside discussion with [name] [name] - EKG concerning for ischemic etiology)  Would not necessarily  anticoagulate for this rhythm - will need EP  Currently on Procainamide & BB 2/2 concern over accessory pathway (HR up on Amio) -- [name] [name] felt that this could have been 1:1 Aflutter)  Unsure of his EF, records from the [place] have been requested and Echo is pending for here. He says that he has been offered an ICD in the past, but says that he "was right on the borderline of needing it" so he declined.   Echo ordered  Acute on chronic diastolic CHF, NYHA class III-IV: Laying mostly flat without orthopnea, IV lasix 40mg  given last night, still  likely needs some diuresis. Will start 40mg  IV BID.  (can assess EDP in cath lab)  Hyperkalemia: Resolved. K is 3.9 this am.  HypoMag - replete with 4 gm DM - on SSI (oral meds held)   Signed, Little Ishikawa, NP  09/02/2016, 6:31 AM    I have seen, examined and evaluated the patient this AM along with Suzzette Righter, NP.  After reviewing all the available data and chart, we discussed the patients laboratory, study & physical findings as well as symptoms in detail. I agree with her findings, examination as well as impression recommendations as per our discussion.    Initial presenting Sx were concerning for Progressive angina & with abnormal rhythm o/n concerning for ischemic WCT, needs cath -- [facility].  Normalize K & replete Mag.  We will be able to assess need for furhter diuresis with LVEDP.  He is wheezing a lot - CXR suggests Chronic Bronchitis -- will order Nebs & Inhaled Steroid.  Need to order home Diclox for chronic staph suppression.   Bryan Lemma, M.D., M.S. Interventional Cardiologist   Pager # [phone] Phone # [phone] [address] [place], [place] [address]

## 2016-09-02 NOTE — Progress Notes (Signed)
ANTICOAGULATION CONSULT NOTE   Pharmacy Consult for Heparin Indication: chest pain/ACS  No Known Allergies  Patient Measurements: Height: 5\' 6"  (167.6 cm) Weight: 172 lb 1.6 oz (78.1 kg) IBW/kg (Calculated) : 63.8  Vital Signs: Temp: 99.2 F (37.3 C) (12/14 0100) Temp Source: Oral (12/14 0100) BP: 115/91 (12/14 0200) Pulse Rate: 81 (12/14 0200)  Labs:  Recent Labs  09/01/16 1521 09/01/16 2246 09/02/16 0308  HGB 14.1  --  13.0  HCT 41.2  --  38.8*  PLT 243  --  207  HEPARINUNFRC  --   --  0.23*  CREATININE 1.30*  --   --   TROPONINI  --  0.08*  --     Estimated Creatinine Clearance: 53.5 mL/min (by C-G formula based on SCr of 1.3 mg/dL (H)).  Assessment: 70 y.o. male with chest pain/VTach for heparin  Goal of Therapy:  Heparin level 0.3-0.7 units/ml Monitor platelets by anticoagulation protocol: Yes   Plan:  Heparin 2000 units IV bolus, then increase heparin  1100 units/hr F/U after cath  Eddie Candle 09/02/2016,3:39 AM

## 2016-09-02 NOTE — Progress Notes (Addendum)
Subjective: called for wide complex tachycardia. Patient only feeling palpitations and some diaphoresis.   Objective: Vitals: BP (!) 81/61   Pulse 80   Temp 98.1 F (36.7 C) (Oral)   Resp 15   Ht 5\' 6"  (1.676 m)   Wt 78.1 kg (172 lb 1.6 oz)   SpO2 100%   BMI 27.78 kg/m   General: in mild distress CV: irregular heart rate no murmurs Pulmonary: CTAB Extremities: no leg swelling Neuro: AAO x 3  Labs: Trop 0.07 creatinine 1.3  Assessment and plan;  Wide complex tachycardia, hemodynamically stable and symptomatic with diaphoresis and palpitations Gave IV metoprolol and amiodarone bolus. However patient developed worsening of tachycardia. 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 Gave procainamide bolus x 2 with resolution of rhythm to normal sinus Started on drip EP consult in AM IV heparin continued  Acute on chronic diastolic heart failure, NYHA class III-IV IV lasix given  Hyperkalemia  Given insulin+D50w, calcium gluconate and lasix   Spent time in direct care 50 minutes

## 2016-09-02 NOTE — Progress Notes (Signed)
ANTICOAGULATION CONSULT NOTE - Initial Consult  Pharmacy Consult:  Heparin Indication: chest pain/ACS, s/p cath 09/02/16  No Known Allergies  Patient Measurements: Height: 5\' 6"  (167.6 cm) Weight: 172 lb 1.6 oz (78.1 kg) IBW/kg (Calculated) : 63.8 Heparin Dosing Weight: 78 kg  Vital Signs: Temp: 97.6 F (36.4 C) (12/14 1653) Temp Source: Oral (12/14 1653) BP: 101/77 (12/14 1552) Pulse Rate: 71 (12/14 1552)  Labs:  Recent Labs  09/01/16 1521 09/01/16 2246 09/02/16 0308 09/02/16 0434 09/02/16 0935  HGB 14.1  --  13.0  --   --   HCT 41.2  --  38.8*  --   --   PLT 243  --  207  --   --   LABPROT  --   --   --  16.1*  --   INR  --   --   --  1.28  --   HEPARINUNFRC  --   --  0.23*  --   --   CREATININE 1.30*  --  1.13  --   --   TROPONINI  --  0.08* 0.07*  --  0.07*    Estimated Creatinine Clearance: 61.5 mL/min (by C-G formula based on SCr of 1.13 mg/dL).   Medical History: Past Medical History:  Diagnosis Date  . Acid reflux   . Coronary artery disease involving native coronary artery of native heart with angina pectoris (HCC) 10/2012   History of perioperative MI after aortobifem bypass; found to have multivessel CAD and referred for CABG x3  . Diabetes mellitus   . Diabetes mellitus type II, non insulin dependent (HCC) 09/16/2011  . Essential hypertension 03/03/2012  . Hypercholesteremia   . Hyperlipidemia associated with type 2 diabetes mellitus (HCC) 09/16/2011  . PAD (peripheral artery disease) (HCC)    s/p aortobifemoral bypass 10/2012  . 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 - Concern for abnormal nuclear stress test;; b. re-do sternotomy in 2015 for ? sternan non-union requiting plate  . 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 Is now on chronic suppressive antibiotics with dicloxacillin       Assessment: 41 YOM presented with chest pain and started on IV heparin.  Patient went to cath today and Pharmacy consulted to restart IV heparin 8 hours post sheath removal.  Sheath removed around 1600 per documentation.  No bleeding nor hematoma reported.   Goal of Therapy:  Heparin level 0.3-0.7 units/ml Monitor platelets by anticoagulation protocol: Yes    Plan:  - At 12 MN, resume heparin gtt at 1100 units/hr - Check 6 hr heparin level - Daily heparin level and CBC   Dorothea Yow D. Laney Potash, PharmD, BCPS Pager:  [phone] 09/02/2016, 6:01 PM

## 2016-09-02 NOTE — Significant Event (Signed)
Rapid Response Event Note RN called at 2359 after central monitoring called her reporting pt having multiple runs of V-Tach, HR 170-200's Overview: Time Called: 2359 Arrival Time: 0000 Event Type: Cardiac  Initial Focused Assessment: Upon arrival pt asymptomatic, skin warm and dry, denies cp/sob. BBB, widening QRS complex, and frequent pvc's noted, with HR 170-200's. Placed on zoll, EKG obtained resulting the same findings. Just prior to transferring to ICU ot became flushed, sweating profusely, with mild sob. BP 147/68, HR 170, 100% RA, RR 18  Interventions: Paged Dr. Virgina Organ with cardiology. Gave 150 mg amiodarone bolus followed by amiodarone gtt, 5 mg lopressor ivp, and 40 mg lasix ivp. Placed on 2L [place], EKG obtained. Amiodarone gtt discontinued per MD, pt transferred to 4N28, just prior to arriving to ICU pt converted to NSR. Plan to start on Procainamide gtt per MD.    Event Summary: Name of Physician Notified: Dr. Virgina Organ at 0002    at    Outcome: Transferred (Comment) [phone])     Gerarda Fraction

## 2016-09-02 NOTE — H&P (View-Only) (Signed)
Patient Name: [name] FallGeorge Capurro Date of Encounter: 09/02/2016  Primary Cardiologist: new - previously [facility] (no followup since 2015)  Hospital Problem List     Principal Problem:   Progressive angina (HCC) - atypical Active Problems:   Atypical angina (HCC)   Coronary artery disease involving native coronary artery of native heart with angina pectoris (HCC)   Wide-complex tachycardia (HCC)   Diabetes mellitus type II, non insulin dependent (HCC)   Hyperlipidemia associated with type 2 diabetes mellitus (HCC)   Tobacco use disorder   PAD (peripheral artery disease) (HCC)   S/P CABG x 3   Acute on chronic heart failure (HCC)   Hyperkalemia    Subjective   Feels ok, tired. Denies chest pain and SOB.   Inpatient Medications    Scheduled Meds: . aspirin EC  81 mg Oral Daily  . atorvastatin  40 mg Oral q1800  . famotidine  20 mg Oral BID  . furosemide  40 mg Intravenous BID  . insulin aspart  0-15 Units Subcutaneous TID WC  . metoprolol succinate  25 mg Oral Daily  . nicotine  21 mg Transdermal Once  . pantoprazole  40 mg Oral Daily  . sodium chloride flush  3 mL Intravenous Q12H   Continuous Infusions: . sodium chloride 1 mL/kg/hr (09/02/16 0700)  . heparin 1,100 Units/hr (09/02/16 0346)  . procainamide (PRONESTYL) 4 MG/ML INFUSION 2 mg/min (09/02/16 0530)   PRN Meds: sodium chloride, acetaminophen, nitroGLYCERIN, ondansetron (ZOFRAN) IV, sodium chloride flush   Vital Signs    Vitals:   09/02/16 0530 09/02/16 0600 09/02/16 0700 09/02/16 0730  BP: (!) 81/61 (!) 87/66 104/85   Pulse: 80 87 85   Resp: 15 (!) 31 17   Temp:    97.6 F (36.4 C)  TempSrc:    Oral  SpO2: 100% 100% 100%   Weight:      Height:        Intake/Output Summary (Last 24 hours) at 09/02/16 0831 Last data filed at 09/02/16 0700  Gross per 24 hour  Intake           533.62 ml  Output              850 ml  Net          -316.38 ml   Filed Weights   09/01/16 1528 09/01/16 2200  Weight:  76.2 kg (168 lb) 78.1 kg (172 lb 1.6 oz)    Physical Exam   GEN: Well nourished, well developed, in no acute distress.  HEENT: Grossly normal.  Neck: Supple, no JVD, carotid bruits, or masses. Cardiac: RRR, no murmurs, rubs, or gallops. No clubbing, cyanosis, edema.  Radials/DP/PT 2+ and equal bilaterally.  Respiratory:  Respirations regular and unlabored, clear to auscultation bilaterally. GI: Soft, nontender, nondistended, BS + x 4. MS: no deformity or atrophy. Skin: warm and dry, no rash. Neuro:  Strength and sensation are intact. Psych: AAOx3.  Normal affect.  Labs    CBC  Recent Labs  09/01/16 1521 09/02/16 0308  WBC 13.1* 10.2  HGB 14.1 13.0  HCT 41.2 38.8*  MCV 87.5 86.2  PLT 243 207   Basic Metabolic Panel  Recent Labs  09/01/16 1521 09/02/16 0308  NA 133* 134*  K 5.3* 3.9  CL 101 100*  CO2 22 23  GLUCOSE 244* 153*  BUN 15 14  CREATININE 1.30* 1.13  CALCIUM 9.6 9.2  MG  --  1.3*   Cardiac Enzymes  Recent Labs  09/01/16 2246 09/02/16 0308  TROPONINI 0.08* 0.07*     Recent Labs  09/02/16 0308  CHOL 87  HDL 26*  LDLCALC 43  TRIG 92  CHOLHDL 3.3     Telemetry    NSR, frequent periods of NSVT vs. Afib w/ aberrancy - Personally Reviewed [facility])  ECG    Polymorphic VT vs. Afib with aberrancy; Difficult to interpret - Personally Reviewed along with EP -- cannot exclude salvos of VT or PAF/PAT with aberrancy given underlying RBBB [facility])  Radiology    Dg Chest 2 View  Result Date: 09/01/2016 CLINICAL DATA:  Intermittent chest pain and shortness of breath for the past 2 weeks. The pain radiates into the shoulders and down to the elbows. History of CABG, gastroesophageal reflux, current smoker. EXAM: CHEST  2 VIEW COMPARISON:  None in PACs FINDINGS: The lungs are well-expanded. There is mild hemidiaphragm flattening. The interstitial markings are coarse. There is no alveolar infiltrate, pleural effusion, or pneumothorax. The heart is top-normal  in size. The pulmonary vascularity is not engorged. There is calcification in the wall of the aortic arch.The patient has undergone previous CABG. There are supplemental plate and screw fixation devices over the sternum. The thoracic vertebral bodies are preserved in height where visualized. IMPRESSION: Chronic bronchitic changes. Previous CABG. No pneumonia nor pulmonary edema. Thoracic aortic atherosclerosis. Electronically Signed   By: David  [place] M.D.   On: 09/01/2016 16:04     Patient Profile     [name] is a 70 year old male with a past medical history of HTN, HLD, DM II, GERD, PAD and CAD s/p 3v CABG presented with dyspnea on exertion, diaphoresis and burning sensation in the chest.  Assessment & Plan    Principal Problem:   Progressive angina (HCC) - atypical Active Problems:   Atypical angina (HCC)   Coronary artery disease involving native coronary artery of native heart with angina pectoris (HCC)   Wide-complex tachycardia (HCC)   Diabetes mellitus type II, non insulin dependent (HCC)   Hyperlipidemia associated with type 2 diabetes mellitus (HCC)   Tobacco use disorder   PAD (peripheral artery disease) (HCC)   S/P CABG x 3   Acute on chronic heart failure (HCC)   Hyperkalemia  Progressive Angina (atypical)-- mild troponin elevation with concerning rhythms o/n; Need to exclude ischemic etiology (? If this is the etiology of his pre-CABG arrhythmia..  IV Heparin  On ASA & statin + BB  Wide complex tachycardia: Overnight patient had frequent episodes of WCT (? VT vs. Afib w/ aberrancy), he was hemodynamically stable, but symptomatic with diaphoresis and palpitations. He was given IV metoprolol and a amiodarone bolus. However, he developed worsening tachycardia, there was some concern of an accessory pathway, so he was started on procainamide. Will ask for EP consult. (curbside discussion with Dr. Ladona Ridgel - EKG concerning for ischemic etiology)  Would not necessarily  anticoagulate for this rhythm - will need EP  Currently on Procainamide & BB 2/2 concern over accessory pathway (HR up on [place]) -- Dr. Ladona Ridgel felt that this could have been 1:1 Aflutter)  Unsure of his EF, records from the [place] have been requested and Echo is pending for here. He says that he has been offered an ICD in the past, but says that he "was right on the borderline of needing it" so he declined.   Echo ordered  Acute on chronic diastolic CHF, NYHA class III-IV: Laying mostly flat without orthopnea, IV lasix 40mg  given last night, still  likely needs some diuresis. Will start 40mg  IV BID.  (can assess EDP in cath lab)  Hyperkalemia: Resolved. K is 3.9 this am.  HypoMag - replete with 4 gm DM - on SSI (oral meds held)   Signed, Little Ishikawa, NP  09/02/2016, 6:31 AM    I have seen, examined and evaluated the patient this AM along with Suzzette Righter, NP.  After reviewing all the available data and chart, we discussed the patients laboratory, study & physical findings as well as symptoms in detail. I agree with her findings, examination as well as impression recommendations as per our discussion.    Initial presenting Sx were concerning for Progressive angina & with abnormal rhythm o/n concerning for ischemic WCT, needs cath -- [facility].  Normalize K & replete Mag.  We will be able to assess need for furhter diuresis with LVEDP.  He is wheezing a lot - CXR suggests Chronic Bronchitis -- will order Nebs & Inhaled Steroid.  Need to order home Diclox for chronic staph suppression.   Bryan Lemma, M.D., M.S. Interventional Cardiologist   Pager # [phone] Phone # [phone] [address] [place], [place] [address]

## 2016-09-02 NOTE — Consult Note (Addendum)
ELECTROPHYSIOLOGY CONSULT NOTE    Patient ID: [name] [name] MRN: [id number], DOB/AGE: May 19, 1946 70 y.o.  Admit date: 09/01/2016 Date of Consult: 09/02/2016   Primary Physician: [name] Bumpers, PA-C Primary Cardiologist: new to [facility], Dr. Herbie [name] (previously [facility], no f/u there in 2 years) Requesting MD: Dr. Herbie [name]  Reason for Consultation: WCT  HPI: [name] [name] is a 70 y.o. male with PMHx of HTN, HLD, DM II, GERD, PAD and CAD s/p 3v CABG. Patient is a poor historian, majority of his history was obtained from his family member. Apparently, he underwent up aortobifemoral bypass procedure for lower extremity PAD in February 2014, he did not have any stress test prior to the surgery. After surgery during recovery period, he had episode of VT which prompted further cardiac workup and eventually led to the cardiac catheterization and also the bypass surgery in March 2014, and post-op non-healing sternal wound/infection issues is s/p sternal plating.  He was admitted to [facility] yesterday evening with c/o chest burning type pain, his trop mildly abnormal initially 0.08 sine  Has been 0.07 x2 and planned for cardiac cath.  He developed WCT overnight, suspect to be AFib or SVT with BBB though concerns for VT and Amio bolus given he had persistent WCT and started on Procanamide, and EP was called to evaluate further.  LABS: K+ 3.9 BUN/Creat 14/1.13 Mag 1.3 >> replaced H/H 13/38 WBC 10.2 plts 207 Trop I: 0.08, 0.07 x2 BNP 1502 (no pulm edema on CXR) cxr without pulmonary edema (chronic bronchitic changes)   Past Medical History:  Diagnosis Date  . Acid reflux   . Coronary artery disease involving native coronary artery of native heart with angina pectoris (HCC) 10/2012   History of perioperative MI after aortobifem bypass; found to have multivessel CAD and referred for CABG x3  . Diabetes mellitus   . Diabetes mellitus type II, non insulin dependent (HCC) 09/16/2011  . Essential  hypertension 03/03/2012  . Hypercholesteremia   . Hyperlipidemia associated with type 2 diabetes mellitus (HCC) 09/16/2011  . PAD (peripheral artery disease) (HCC)    s/p aortobifemoral bypass 10/2012  . 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 - Concern for abnormal nuclear stress test;; b. re-do sternotomy in 2015 for ? sternan non-union requiting plate  . 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 Is now on chronic suppressive antibiotics with dicloxacillin     Surgical History:  Past Surgical History:  Procedure Laterality Date  . AORTO-FEMORAL BYPASS GRAFT Bilateral ~Jan 2014   [place]  . CARDIAC CATHETERIZATION  11/2012   [facility] (? for abnormal Nuclear ST) --> no PCI, by report, patent grafts.  [name] CARDIAC SURGERY  2015   re-do sternotomy with metal place "sheild" placed. -Has chronic staph infection, on chronic suppressive medication  . CHOLECYSTECTOMY  03/05/2012   Procedure: LAPAROSCOPIC CHOLECYSTECTOMY WITH INTRAOPERATIVE CHOLANGIOGRAM;  Surgeon: [name] Loron, MD;  Location: [facility] OR;  Service: General;  Laterality: N/A;  . CORONARY ARTERY BYPASS GRAFT  10/2012   x 3; [facility]  . HERNIA REPAIR    . UMBILICAL HERNIA REPAIR  03/05/2012   Procedure: HERNIA REPAIR UMBILICAL ADULT;  Surgeon: [name] Loron, MD;  Location: [facility] OR;  Service: General;  Laterality: N/A;     Prescriptions Prior to Admission  Medication Sig Dispense  Refill Last Dose  . albuterol (PROVENTIL HFA) 108 (90 Base) MCG/ACT inhaler Inhale 1-2 puffs into the lungs every 6 (six) hours as needed for wheezing or shortness of breath.   Past Week at Unknown time  . aspirin EC 81 MG tablet Take 81 mg by mouth daily.     08/30/2016 at am  . atorvastatin (LIPITOR) 80 MG tablet Take  40 mg by mouth at bedtime.   08/31/2016 at pm  . Cholecalciferol (VITAMIN D-3 PO) Take 2,000 mg by mouth daily.    09/01/2016 at am  . dicloxacillin (DYNAPEN) 250 MG capsule Take 500 mg by mouth 3 (three) times daily.   09/01/2016 at am  . fluticasone [facility](FLONASE ALLERGY RELIEF) 50 MCG/ACT nasal spray Place 2 sprays into both nostrils daily as needed for allergies or rhinitis.   Past Week at Unknown time  . glipiZIDE (GLUCOTROL) 5 MG tablet Take 2.5 mg by mouth daily before breakfast.   09/01/2016 at am  . metFORMIN (GLUCOPHAGE) 1000 MG tablet Take 1,000 mg by mouth 2 (two) times daily with a meal.   09/01/2016 at am  . metoprolol tartrate (LOPRESSOR) 25 MG tablet Take 25 mg by mouth 2 (two) times daily.   09/01/2016 at 1000  . naproxen (NAPROSYN) 250 MG tablet Take 250 mg by mouth at bedtime.   08/31/2016 at pm  . omeprazole (PRILOSEC) 20 MG capsule Take 20 mg by mouth daily.   09/01/2016 at Unknown time  . ranitidine (ZANTAC) 150 MG tablet Take 150 mg by mouth daily as needed for heartburn.    Past Week at Unknown time  . valsartan (DIOVAN) 80 MG tablet Take 80 mg by mouth daily.   09/01/2016 at 1000    Inpatient Medications:  . aspirin EC  81 mg Oral Daily  . atorvastatin  40 mg Oral q1800  . dicloxacillin  500 mg Oral Q8H  . famotidine  20 mg Oral BID  . fluticasone  2 spray Each Nare Daily  . furosemide  40 mg Intravenous BID  . insulin aspart  0-15 Units Subcutaneous TID WC  . ipratropium-albuterol  3 mL Nebulization Q6H  . metoprolol succinate  25 mg Oral Daily  . nicotine  21 mg Transdermal Once  . pantoprazole  40 mg Oral Daily  . sodium chloride flush  3 mL Intravenous Q12H    Allergies: No Known Allergies  Social History   Social History  . Marital status: Married    Spouse name: N/A  . Number of children: N/A  . Years of education: N/A   Occupational History  . Not on file.   Social History Main Topics  . Smoking status: Current Every Day Smoker    Packs/day: 1.00    . Smokeless tobacco: Never Used  . Alcohol use No  . Drug use: No  . Sexual activity: Not on file   Other Topics Concern  . Not on file   Social History Narrative  . No narrative on file     Family History  Problem Relation Age of Onset  . Cancer Brother     esophageal     Review of Systems: All other systems reviewed and are otherwise negative except as noted above.  Physical Exam: Vitals:   09/02/16 1016 09/02/16 1100 09/02/16 1200 09/02/16 1211  BP:  104/79 110/74   Pulse:  76 72   Resp:  11 (!) 23   Temp:    97.7 F (36.5 C)  TempSrc:  Oral  SpO2: 100% 99% 99%   Weight:      Height:        GEN- The patient is well appearing, alert and oriented x 3 today.   HEENT: normocephalic, atraumatic; sclera clear, conjunctiva pink; hearing intact; oropharynx clear; neck supple, no JVP Lymph- no cervical lymphadenopathy Lungs- soft exp wheezes b/l, normal work of breathing.  No rales, rhonchi Heart- Regular rate and rhythm, no murmurs, rubs or gallops, PMI not laterally displaced GI- soft, non-tender, non-distended Extremities- no clubbing, cyanosis, or edema MS- no significant deformity or atrophy Skin- warm and dry, no rash or lesion Psych- euthymic mood, full affect Neuro- no gross deficits observed  Labs:   Lab Results  Component Value Date   WBC 10.2 09/02/2016   HGB 13.0 09/02/2016   HCT 38.8 (L) 09/02/2016   MCV 86.2 09/02/2016   PLT 207 09/02/2016    Recent Labs Lab 09/02/16 0308  NA 134*  K 3.9  CL 100*  CO2 23  BUN 14  CREATININE 1.13  CALCIUM 9.2  GLUCOSE 153*      Radiology/Studies:  Dg Chest 2 View Result Date: 09/01/2016 CLINICAL DATA:  Intermittent chest pain and shortness of breath for the past 2 weeks. The pain radiates into the shoulders and down to the elbows. History of CABG, gastroesophageal reflux, current smoker. EXAM: CHEST  2 VIEW COMPARISON:  None in PACs FINDINGS: The lungs are well-expanded. There is mild hemidiaphragm  flattening. The interstitial markings are coarse. There is no alveolar infiltrate, pleural effusion, or pneumothorax. The heart is top-normal in size. The pulmonary vascularity is not engorged. There is calcification in the wall of the aortic arch.The patient has undergone previous CABG. There are supplemental plate and screw fixation devices over the sternum. The thoracic vertebral bodies are preserved in height where visualized. IMPRESSION: Chronic bronchitic changes. Previous CABG. No pneumonia nor pulmonary edema. Thoracic aortic atherosclerosis. Electronically Signed   By: David  [place] M.D.   On: 09/01/2016 16:04    EKG: #1,2, SR, 1st degree AVBlock, RBBB, LAD #3, irregular, RBBB, LAD, uncertain underlying rhythm though likely repetitive monomorphic VT TELEMETRY: SR, WCT, sustained VT in cath lab Echo: ordered is pending LHC today Conclusion    There is severe left ventricular systolic dysfunction.  LV end diastolic pressure is severely elevated.  The left ventricular ejection fraction is less than 25% by visual estimate.  There is no mitral valve regurgitation.  Prox RCA lesion, 100 %stenosed.  SVG graft was visualized by angiography.  Origin to Prox Graft lesion, 50 %stenosed.  Ost Cx to Prox Cx lesion, 60 %stenosed.  Ost LM to LM lesion, 60 %stenosed.  SVG graft was visualized by angiography.  LIMA graft was visualized by angiography.  Mid LAD lesion, 99 %stenosed.   1. Severe triple vessel CAD s/p 3V CABG 2. Moderate distal left main stenosis 3. Subtotal occlusion of the mid LAD. Patent LIMA graft to mid LAD 4. Patent Circumflex system. Patent vein graft to the high obtuse marginal. This fills the entire lateral wall Circumflex system and some of the LAD. Of note, there is also antegrade flow into the Circumflex from injection of the left main.  5. The RCA is occluded proximally. The vein graft to the distal RCA is patent. The vein graft is small in caliber with no  flow limiting stenoses.  6. Severe LV systolic dysfunction, LVEF=10-15%.  7. Wide complex tachycardia. This began after access in the left radial artery. As discussed in the  narrative, we attempted DCCV x 2, bolus of amiodarone x 2, adenosine. This eventually converted to sinus with IV Lidocaine.  Recommendations: Medical management of CAD. EP to see to discuss wide complex tachycardia.      Assessment and Plan:   1. CP, mild abn Trop     S/p cath as above, no interventions      2. VT     The patient sustained VT in the cath lab, given amio bolus and gtt, shocked x2 without success and returned to SR after lidocaine given      Remains on lidocaine gtt and amiodarone gtt      Severe LV dysfunction, 10-15% by LV gram      Recommend ICD implant, discussed procedure, risks, benefits, with the patient and family, they are agreeable      Will plan for tomorrow  3.  Hypomagnesemia      Replaced this morning      Keep >/= 2.0  4. CAD     S/p cath, as above     Not felt to contribute or have culprit lesion for VT     No intervention     Will stop heparin gtt with planned ICD implant tomorrow  5. Hx of post CABG sternal infection (2014)     On lifelong antibiotics     Afebrile     WBC wnl   Signed, Francis Dowse, PA-C 09/02/2016 1:05 PM  EP Attending  Patient seen and examined. Agree with the findings as documented above. I've discussed the situation with Dr. Herbie [name]. The patient is a 70 year old man who was admitted to the hospital with chest pain, and found to have spontaneous occurring wide QRS tachycardia. He is undergone heart catheterization demonstrating patent grafts and severe three-vessel coronary disease. Left ventricular function was severely impaired although it was in the setting of recovery from sustained monomorphic VT. The patient has not had syncope. There is no history of sudden death. He did not have a myocardial infarction. His exam is notable for 70 year old man  appearing somewhat older than his stated age, in no acute distress. Cardiovascular exam reveals an irregular rhythm. Lungs are notable for mild expiratory wheezes but no increased work of breathin. The extremities demonstrated no peripheral edema. Neurologically he is nonfocal. Assessment and plan 1. Ventricular tachycardia - there is no secondary cause for the VT. He has severe left ventricular dysfunction and patent grafts. The risk, goals, benefits, and expectations of ICD implantation a been discussed with the patient. He is considering and will likely plan to proceed with this procedure. 2. Ischemic cardiomyopathy - his grafts are patent. He will continue medical therapy. 3. Chronic systolic heart failure - his EF is quite low although it may be not as severe after he has recovered from his sustained VT. He will need initiation of heart failure therapy.  Lewayne Bunting, M.D.

## 2016-09-02 NOTE — Progress Notes (Signed)
Pt had multiple beats run of V-Tach, then what looked like a wide complex tachycardia on the monitor with HR sustaining in the 170s-180s. Pt was alert and oriented to self, place, time, and situation. Denies chest pain and SOB, and did not appear to be in distress. Rapid response was called for assistance and MD was paged. EKG and VS were obtained. Multiple medications (amiodarone, metoprolol, lasix) were given as ordered by MD. Pt became diaphoretic, and was transferred to the ICU as ordered by MD.      09/02/16 0005  Vitals  BP (!) 147/68  MAP (mmHg) 85  BP Location Left Arm  BP Method Automatic  Patient Position (if appropriate) Lying  ECG Heart Rate (!) 170  Cardiac Rhythm BBB;VT

## 2016-09-02 NOTE — Interval H&P Note (Signed)
History and Physical Interval Note:  09/02/2016 2:46 PM  Lexis Prenatt  has presented today for cardiac cath with the diagnosis of VT, CAD, unstable angina. The various methods of treatment have been discussed with the patient and family. After consideration of risks, benefits and other options for treatment, the patient has consented to  Procedure(s): Left Heart Cath and Cors/Grafts Angiography (N/A) as a surgical intervention .  The patient's history has been reviewed, patient examined, no change in status, stable for surgery.  I have reviewed the patient's chart and labs.  Questions were answered to the patient's satisfaction.    Cath Lab Visit (complete for each Cath Lab visit)  Clinical Evaluation Leading to the Procedure:   ACS: No.  Non-ACS:    Anginal Classification: CCS III  Anti-ischemic medical therapy: Maximal Therapy (2 or more classes of medications)  Non-Invasive Test Results: No non-invasive testing performed  Prior CABG: Previous CABG         Verne Carrow

## 2016-09-02 NOTE — Progress Notes (Signed)
CRITICAL VALUE ALERT  Critical value received:  Troponin= 0.08  Date of notification:  09/01/16  Time of notification:  2351   Critical value read back:Yes.    Nurse who received alert:  Kashius Dominic, RN   MD notified (1st page): Lisabeth Devoid, [place]  Time of first page:  23:55  MD notified (2nd page): 00:10(Dr. Virgina Organ)  Time of second page:  Responding MD:  Dr. Virgina Organ  Time MD responded:  00:05

## 2016-09-03 ENCOUNTER — Encounter (HOSPITAL_COMMUNITY)
Admission: EM | Disposition: A | Discharge status: Home / Self Care | Payer: Self-pay | Source: Home / Self Care | Attending: Cardiology

## 2016-09-03 ENCOUNTER — Encounter (HOSPITAL_COMMUNITY): Payer: Self-pay | Admitting: Cardiovascular Disease

## 2016-09-03 DIAGNOSIS — I5022 Chronic systolic (congestive) heart failure: Secondary | ICD-10-CM

## 2016-09-03 DIAGNOSIS — I5042 Chronic combined systolic (congestive) and diastolic (congestive) heart failure: Secondary | ICD-10-CM | POA: Clinically undetermined

## 2016-09-03 DIAGNOSIS — I255 Ischemic cardiomyopathy: Secondary | ICD-10-CM

## 2016-09-03 DIAGNOSIS — I5043 Acute on chronic combined systolic (congestive) and diastolic (congestive) heart failure: Secondary | ICD-10-CM

## 2016-09-03 DIAGNOSIS — I420 Dilated cardiomyopathy: Secondary | ICD-10-CM

## 2016-09-03 HISTORY — PX: BI-VENTRICULAR IMPLANTABLE CARDIOVERTER DEFIBRILLATOR  (CRT-D): SHX5747

## 2016-09-03 HISTORY — PX: EP IMPLANTABLE DEVICE: SHX172B

## 2016-09-03 LAB — CBC
HEMATOCRIT: 36.8 % — AB (ref 39.0–52.0)
HEMOGLOBIN: 12.4 g/dL — AB (ref 13.0–17.0)
MCH: 28.6 pg (ref 26.0–34.0)
MCHC: 33.7 g/dL (ref 30.0–36.0)
MCV: 84.8 fL (ref 78.0–100.0)
Platelets: 198 10*3/uL (ref 150–400)
RBC: 4.34 MIL/uL (ref 4.22–5.81)
RDW: 13.6 % (ref 11.5–15.5)
WBC: 10.4 10*3/uL (ref 4.0–10.5)

## 2016-09-03 LAB — BASIC METABOLIC PANEL
Anion gap: 11 (ref 5–15)
BUN: 17 mg/dL (ref 6–20)
CALCIUM: 8.7 mg/dL — AB (ref 8.9–10.3)
CO2: 26 mmol/L (ref 22–32)
CREATININE: 1.36 mg/dL — AB (ref 0.61–1.24)
Chloride: 91 mmol/L — ABNORMAL LOW (ref 101–111)
GFR calc non Af Amer: 52 mL/min — ABNORMAL LOW (ref >60)
Glucose, Bld: 289 mg/dL — ABNORMAL HIGH (ref 65–99)
Potassium: 3.9 mmol/L (ref 3.5–5.1)
SODIUM: 128 mmol/L — AB (ref 135–145)

## 2016-09-03 LAB — GLUCOSE, CAPILLARY
GLUCOSE-CAPILLARY: 165 mg/dL — AB (ref 65–99)
GLUCOSE-CAPILLARY: 151 mg/dL — AB (ref 65–99)
Glucose-Capillary: 173 mg/dL — ABNORMAL HIGH (ref 65–99)
Glucose-Capillary: 126 mg/dL — ABNORMAL HIGH (ref 65–99)
Glucose-Capillary: 157 mg/dL — ABNORMAL HIGH (ref 65–99)

## 2016-09-03 LAB — SURGICAL PCR SCREEN
MRSA, PCR: NEGATIVE
STAPHYLOCOCCUS AUREUS: NEGATIVE

## 2016-09-03 LAB — MAGNESIUM: Magnesium: 1.7 mg/dL (ref 1.7–2.4)

## 2016-09-03 LAB — HEPARIN LEVEL (UNFRACTIONATED): Heparin Unfractionated: 0.23 IU/mL — ABNORMAL LOW (ref 0.30–0.70)

## 2016-09-03 SURGERY — BIV ICD INSERTION CRT-D

## 2016-09-03 MED ORDER — ONDANSETRON HCL 4 MG/2ML IJ SOLN: 4.0000 mg | Freq: Four times a day (QID) | INTRAMUSCULAR | Status: DC | PRN

## 2016-09-03 MED ORDER — ALBUTEROL SULFATE HFA 108 (90 BASE) MCG/ACT IN AERS: 1.0000 | INHALATION_SPRAY | Freq: Four times a day (QID) | RESPIRATORY_TRACT | Status: DC | PRN

## 2016-09-03 MED ORDER — ACETAMINOPHEN 325 MG PO TABS
325.0000 mg | ORAL_TABLET | ORAL | Status: DC | PRN
Start: 2016-09-03 — End: 2016-09-04
  Administered 2016-09-03 – 2016-09-04 (×2): 650 mg via ORAL
  Filled 2016-09-03 (×2): qty 2

## 2016-09-03 MED ORDER — GENTAMICIN SULFATE 40 MG/ML IJ SOLN
INTRAMUSCULAR | Status: AC
  Filled 2016-09-03: qty 2

## 2016-09-03 MED ORDER — MIDAZOLAM HCL 5 MG/5ML IJ SOLN
INTRAMUSCULAR | Status: AC
  Filled 2016-09-03: qty 5

## 2016-09-03 MED ORDER — FLUTICASONE PROPIONATE 50 MCG/ACT NA SUSP: 2.0000 | Freq: Every day | NASAL | Status: DC | PRN

## 2016-09-03 MED ORDER — CEFAZOLIN IN D5W 1 GM/50ML IV SOLN
1.0000 g | Freq: Four times a day (QID) | INTRAVENOUS | Status: AC
  Administered 2016-09-03 – 2016-09-04 (×3): 1 g via INTRAVENOUS
  Filled 2016-09-03 (×3): qty 50

## 2016-09-03 MED ORDER — DICLOXACILLIN SODIUM 500 MG PO CAPS: 500.0000 mg | ORAL_CAPSULE | Freq: Three times a day (TID) | ORAL | Status: DC

## 2016-09-03 MED ORDER — METOPROLOL TARTRATE 25 MG PO TABS
25.0000 mg | ORAL_TABLET | Freq: Two times a day (BID) | ORAL | Status: DC
  Administered 2016-09-03: 25 mg via ORAL
  Filled 2016-09-03: qty 1

## 2016-09-03 MED ORDER — GLIPIZIDE 5 MG PO TABS
2.5000 mg | ORAL_TABLET | Freq: Every day | ORAL | Status: DC
  Administered 2016-09-04: 08:00:00 2.5 mg via ORAL
  Filled 2016-09-03: qty 1

## 2016-09-03 MED ORDER — CEFAZOLIN SODIUM-DEXTROSE 2-4 GM/100ML-% IV SOLN
INTRAVENOUS | Status: AC
  Filled 2016-09-03: qty 100

## 2016-09-03 MED ORDER — NICOTINE 7 MG/24HR TD PT24
7.0000 mg | MEDICATED_PATCH | Freq: Every day | TRANSDERMAL | Status: DC
  Administered 2016-09-04: 7 mg via TRANSDERMAL
  Filled 2016-09-03: qty 1

## 2016-09-03 MED ORDER — LIDOCAINE HCL (PF) 1 % IJ SOLN
INTRAMUSCULAR | Status: AC
  Filled 2016-09-03: qty 30

## 2016-09-03 MED ORDER — FENTANYL CITRATE (PF) 100 MCG/2ML IJ SOLN
INTRAMUSCULAR | Status: AC
  Filled 2016-09-03: qty 2

## 2016-09-03 MED ORDER — HEPARIN (PORCINE) IN NACL 2-0.9 UNIT/ML-% IJ SOLN
INTRAMUSCULAR | Status: DC | PRN
  Administered 2016-09-03: 500 mL

## 2016-09-03 MED ORDER — IOPAMIDOL (ISOVUE-370) INJECTION 76%
INTRAVENOUS | Status: DC | PRN
  Administered 2016-09-03: 40 mL via INTRAVENOUS

## 2016-09-03 MED ORDER — MAGNESIUM OXIDE 400 (241.3 MG) MG PO TABS
400.0000 mg | ORAL_TABLET | Freq: Every day | ORAL | Status: DC
  Administered 2016-09-03 – 2016-09-04 (×2): 400 mg via ORAL
  Filled 2016-09-03 (×2): qty 1

## 2016-09-03 MED ORDER — POTASSIUM CHLORIDE CRYS ER 20 MEQ PO TBCR
20.0000 meq | EXTENDED_RELEASE_TABLET | Freq: Every day | ORAL | Status: DC
  Administered 2016-09-03 – 2016-09-04 (×2): 20 meq via ORAL
  Filled 2016-09-03 (×2): qty 1

## 2016-09-03 MED ORDER — METFORMIN HCL 500 MG PO TABS: 1000.0000 mg | ORAL_TABLET | Freq: Two times a day (BID) | ORAL | Status: DC

## 2016-09-03 MED ORDER — ALBUTEROL SULFATE (2.5 MG/3ML) 0.083% IN NEBU
2.5000 mg | INHALATION_SOLUTION | Freq: Four times a day (QID) | RESPIRATORY_TRACT | Status: DC | PRN
  Administered 2016-09-03: 2.5 mg via RESPIRATORY_TRACT
  Filled 2016-09-03: qty 3

## 2016-09-03 MED ORDER — NAPROXEN 250 MG PO TABS
250.0000 mg | ORAL_TABLET | Freq: Every day | ORAL | Status: DC
  Administered 2016-09-03: 250 mg via ORAL
  Filled 2016-09-03: qty 1

## 2016-09-03 MED ORDER — LIDOCAINE HCL (PF) 1 % IJ SOLN
INTRAMUSCULAR | Status: DC | PRN
  Administered 2016-09-03: 57 mL via SUBCUTANEOUS

## 2016-09-03 MED ORDER — YOU HAVE A PACEMAKER BOOK
Freq: Once | Status: AC
  Administered 2016-09-03: 21:00:00
  Filled 2016-09-03: qty 1

## 2016-09-03 MED ORDER — MIDAZOLAM HCL 5 MG/5ML IJ SOLN
INTRAMUSCULAR | Status: DC | PRN
  Administered 2016-09-03: 2 mg via INTRAVENOUS
  Administered 2016-09-03: 1 mg via INTRAVENOUS

## 2016-09-03 MED ORDER — ATORVASTATIN CALCIUM 40 MG PO TABS: 40.0000 mg | ORAL_TABLET | Freq: Every day | ORAL | Status: DC

## 2016-09-03 MED ORDER — FENTANYL CITRATE (PF) 100 MCG/2ML IJ SOLN
INTRAMUSCULAR | Status: DC | PRN
  Administered 2016-09-03: 12.5 ug via INTRAVENOUS
  Administered 2016-09-03: 25 ug via INTRAVENOUS
  Administered 2016-09-03 (×2): 12.5 ug via INTRAVENOUS

## 2016-09-03 MED FILL — Verapamil HCl IV Soln 2.5 MG/ML: INTRAVENOUS | Qty: 2 | Status: AC

## 2016-09-03 MED FILL — Lidocaine IV Infusion in D5W Inj 4 MG/ML: INTRAVENOUS | Qty: 500 | Status: AC

## 2016-09-03 MED FILL — Amiodarone HCl Inj 150 MG/3ML (50 MG/ML): INTRAVENOUS | Qty: 3 | Status: AC

## 2016-09-03 SURGICAL SUPPLY — 21 items
ASSURA CRTD CD3369-40C (ICD Generator) ×3 IMPLANT
BALLN COR SINUS VENO 6F 80CM (BALLOONS) ×1
BALLN COR SINUS VENO 6FR 80 (BALLOONS) ×2
BALLOON COR SINUS VENO 6FR 80 (BALLOONS) IMPLANT
CABLE SURGICAL S-101-97-12 (CABLE) ×2 IMPLANT
CATH CPS DIRECT 135 DS2C020 (CATHETERS) ×2 IMPLANT
CATH HEX JOS 2-5-2 65CM 6F REP (CATHETERS) ×2 IMPLANT
CPS IMPLANT KIT 410190 (MISCELLANEOUS) ×2 IMPLANT
DEFIB ASSURA CRT-D (ICD Generator) IMPLANT
KIT ESSENTIALS PG (KITS) ×2 IMPLANT
LEAD DURATA 7122-65CM (Lead) ×2 IMPLANT
LEAD QUARTET 1456Q-86 (Lead) IMPLANT
LEAD TENDRIL MRI 52CM LPA1200M (Lead) ×2 IMPLANT
PAD DEFIB LIFELINK (PAD) ×2 IMPLANT
QUARTET 1456Q-86 (Lead) ×3 IMPLANT
SHEATH CLASSIC 7F (SHEATH) ×2 IMPLANT
SHEATH CLASSIC 8F (SHEATH) ×2 IMPLANT
SLITTER UNIVERSAL DS2A003 (MISCELLANEOUS) ×2 IMPLANT
TRAY PACEMAKER INSERTION (PACKS) ×2 IMPLANT
WIRE ACUITY WHISPER EDS 4648 (WIRE) ×2 IMPLANT
WIRE LUGE 182CM (WIRE) ×4 IMPLANT

## 2016-09-03 NOTE — H&P (View-Only) (Signed)
Last evening the patient/family mentioned cost/coverage-wise they understood would be better to [facility] if stable to do so, in discussion last night with dr. Taylor, he felt that he was stable to transfer but they stated their preference would be to stay.   They were going to discuss this last night and let us know.   I revisited this morning with the patient and wife regarding transfer to [facility] facility for further care and ICD implant, they state that in discussion last night with their daughter they would prefer not to transfer and proceed here with his care and implant today.  [name] Viloria, PA-C  EP Attending  Agree with above.   Gregg Taylor,M.D. 

## 2016-09-03 NOTE — Interval H&P Note (Signed)
History and Physical Interval Note:  09/03/2016 1:52 PM  [name] [name]  has presented today for surgery, with the diagnosis of vtach  The various methods of treatment have been discussed with the patient and family. After consideration of risks, benefits and other options for treatment, the patient has consented to  Procedure(s): BiV ICD Insertion CRT-D (N/A) as a surgical intervention .  The patient's history has been reviewed, patient examined, no change in status, stable for surgery.  I have reviewed the patient's chart and labs.  Questions were answered to the patient's satisfaction.     Lewayne Bunting

## 2016-09-03 NOTE — Interval H&P Note (Signed)
History and Physical Interval Note:  09/03/2016 8:00 AM  [name] [name]  has presented today for surgery, with the diagnosis of vtach  The various methods of treatment have been discussed with the patient and family. After consideration of risks, benefits and other options for treatment, the patient has consented to  Procedure(s): ICD Implant (N/A) (Biventricular ICD)  as a surgical intervention .  The patient's history has been reviewed, patient examined, no change in status, stable for surgery.  I have reviewed the patient's chart and labs.  Questions were answered to the patient's satisfaction.     ICD Criteria  Current LVEF:20%. Within 12 months prior to implant: Yes   Heart failure history: Yes, Class III  Cardiomyopathy history: Yes, Ischemic Cardiomyopathy - Prior MI.  Atrial Fibrillation/Atrial Flutter: No.  Ventricular tachycardia history: Yes, Hemodynamic instability present. VT Type: Sustained Ventricular Tachycardia - Monomorphic.  Cardiac arrest history: No.  History of syndromes with risk of sudden death: No.  Previous ICD: No.  Current ICD indication: Secondary  PPM indication:  Yes BiV ICD for RBBB with QRS > 150 msec and PR > 200 msec  Beta Blocker therapy for 3 or more months: Yes, prescribed.   Ace Inhibitor/ARB therapy for 3 or more months: Yes, prescribed.     Hillis Range

## 2016-09-03 NOTE — H&P (View-Only) (Signed)
SUBJECTIVE: The patient is doing well today.  At this time, he denies chest pain, shortness of breath, or any new concerns.  [name] aspirin EC  81 mg Oral Daily  . atorvastatin  40 mg Oral q1800  .  ceFAZolin (ANCEF) IV  2 g Intravenous To Cath  . chlorhexidine  60 mL Topical Once  . dicloxacillin  500 mg Oral Q8H  . famotidine  20 mg Oral BID  . fluticasone  2 spray Each Nare Daily  . furosemide  40 mg Intravenous BID  . gentamicin irrigation  80 mg Irrigation To Cath  . insulin aspart  0-15 Units Subcutaneous TID WC  . ipratropium-albuterol  3 mL Nebulization Q6H  . mouth rinse  15 mL Mouth Rinse BID  . metoprolol succinate  25 mg Oral Daily  . pantoprazole  40 mg Oral Daily  . sodium chloride flush  3 mL Intravenous Q12H   . sodium chloride    . sodium chloride    . amiodarone 30 mg/hr (09/03/16 0614)    OBJECTIVE: Physical Exam: Vitals:   09/03/16 0530 09/03/16 0545 09/03/16 0600 09/03/16 0630  BP: 101/78 121/81 125/83 (!) 120/92  Pulse: 68 76 66 71  Resp: 11 (!) 28 16 18   Temp:      TempSrc:      SpO2: 100% 99% 99% 100%  Weight:      Height:        Intake/Output Summary (Last 24 hours) at 09/03/16 0757 Last data filed at 09/03/16 7915  Gross per 24 hour  Intake          1301.75 ml  Output             3275 ml  Net         -1973.25 ml    Telemetry reveals sinus rhythm, VT is controlled  GEN- The patient is well appearing, sleeping but rouses Head- normocephalic, atraumatic Eyes-  Sclera clear, conjunctiva pink Ears- hearing intact Oropharynx- clear Neck- supple, + JVD Lungs- decreased BS at bases, normal work of breathing Heart- Regular rate and rhythm  GI- soft, NT, ND, + BS Extremities- no clubbing, cyanosis, or edema Skin- no rash or lesion Psych- euthymic mood, full affect Neuro- strength and sensation are intact  LABS: Basic Metabolic Panel:  Recent Labs  05/69/79 0308 09/03/16 0554  NA 134* 128*  K 3.9 3.9  CL 100* 91*  CO2 23 26    GLUCOSE 153* 289*  BUN 14 17  CREATININE 1.13 1.36*  CALCIUM 9.2 8.7*  MG 1.3*  --    Liver Function Tests: No results for input(s): AST, ALT, ALKPHOS, BILITOT, PROT, ALBUMIN in the last 72 hours. No results for input(s): LIPASE, AMYLASE in the last 72 hours. CBC:  Recent Labs  09/02/16 0308 09/03/16 0554  WBC 10.2 10.4  HGB 13.0 12.4*  HCT 38.8* 36.8*  MCV 86.2 84.8  PLT 207 198   Cardiac Enzymes:  Recent Labs  09/01/16 2246 09/02/16 0308 09/02/16 0935  TROPONINI 0.08* 0.07* 0.07*   BNP: Invalid input(s): POCBNP D-Dimer: No results for input(s): DDIMER in the last 72 hours. Hemoglobin A1C: No results for input(s): HGBA1C in the last 72 hours. Fasting Lipid Panel:  Recent Labs  09/02/16 0308  CHOL 87  HDL 26*  LDLCALC 43  TRIG 92  CHOLHDL 3.3   RADIOLOGY: Dg Chest 2 View  Result Date: 09/01/2016 CLINICAL DATA:  Intermittent chest pain and shortness of breath for the past 2 weeks.  The pain radiates into the shoulders and down to the elbows. History of CABG, gastroesophageal reflux, current smoker. EXAM: CHEST  2 VIEW COMPARISON:  None in PACs FINDINGS: The lungs are well-expanded. There is mild hemidiaphragm flattening. The interstitial markings are coarse. There is no alveolar infiltrate, pleural effusion, or pneumothorax. The heart is top-normal in size. The pulmonary vascularity is not engorged. There is calcification in the wall of the aortic arch.The patient has undergone previous CABG. There are supplemental plate and screw fixation devices over the sternum. The thoracic vertebral bodies are preserved in height where visualized. IMPRESSION: Chronic bronchitic changes. Previous CABG. No pneumonia nor pulmonary edema. Thoracic aortic atherosclerosis. Electronically Signed   By: David  Jordan M.D.   On: 09/01/2016 16:04    ASSESSMENT AND PLAN:  The patient has symptomatic ischemic VT.  Cath reveals no lesions amenable to revascularization.  Will stop  lidocaine at this time.  [facility] can [facility] for BiV ICD.  If not, will place here. Given ischemic VT with structural heart disease, QRS >150 msec, I would advise BiV ICD. Risks, benefits, alternatives to BiVICD implantation were discussed in detail with the patient today. The patient  understands that the risks include but are not limited to bleeding, infection, pneumothorax, perforation, tamponade, vascular damage, renal failure, MI, stroke, death, inappropriate shocks, and lead dislodgement and wishes to proceed.  We will therefore schedule device implantation at the next available time unless [facility] can [facility].    [name] Koskela, MD 09/03/2016 7:57 AM  

## 2016-09-03 NOTE — Progress Notes (Signed)
SUBJECTIVE: The patient is doing well today.  At this time, he denies chest pain, shortness of breath, or any new concerns.  [name] aspirin EC  81 mg Oral Daily  . atorvastatin  40 mg Oral q1800  .  ceFAZolin (ANCEF) IV  2 g Intravenous To Cath  . chlorhexidine  60 mL Topical Once  . dicloxacillin  500 mg Oral Q8H  . famotidine  20 mg Oral BID  . fluticasone  2 spray Each Nare Daily  . furosemide  40 mg Intravenous BID  . gentamicin irrigation  80 mg Irrigation To Cath  . insulin aspart  0-15 Units Subcutaneous TID WC  . ipratropium-albuterol  3 mL Nebulization Q6H  . mouth rinse  15 mL Mouth Rinse BID  . metoprolol succinate  25 mg Oral Daily  . pantoprazole  40 mg Oral Daily  . sodium chloride flush  3 mL Intravenous Q12H   . sodium chloride    . sodium chloride    . amiodarone 30 mg/hr (09/03/16 0614)    OBJECTIVE: Physical Exam: Vitals:   09/03/16 0530 09/03/16 0545 09/03/16 0600 09/03/16 0630  BP: 101/78 121/81 125/83 (!) 120/92  Pulse: 68 76 66 71  Resp: 11 (!) 28 16 18   Temp:      TempSrc:      SpO2: 100% 99% 99% 100%  Weight:      Height:        Intake/Output Summary (Last 24 hours) at 09/03/16 0757 Last data filed at 09/03/16 7915  Gross per 24 hour  Intake          1301.75 ml  Output             3275 ml  Net         -1973.25 ml    Telemetry reveals sinus rhythm, VT is controlled  GEN- The patient is well appearing, sleeping but rouses Head- normocephalic, atraumatic Eyes-  Sclera clear, conjunctiva pink Ears- hearing intact Oropharynx- clear Neck- supple, + JVD Lungs- decreased BS at bases, normal work of breathing Heart- Regular rate and rhythm  GI- soft, NT, ND, + BS Extremities- no clubbing, cyanosis, or edema Skin- no rash or lesion Psych- euthymic mood, full affect Neuro- strength and sensation are intact  LABS: Basic Metabolic Panel:  Recent Labs  05/69/79 0308 09/03/16 0554  NA 134* 128*  K 3.9 3.9  CL 100* 91*  CO2 23 26    GLUCOSE 153* 289*  BUN 14 17  CREATININE 1.13 1.36*  CALCIUM 9.2 8.7*  MG 1.3*  --    Liver Function Tests: No results for input(s): AST, ALT, ALKPHOS, BILITOT, PROT, ALBUMIN in the last 72 hours. No results for input(s): LIPASE, AMYLASE in the last 72 hours. CBC:  Recent Labs  09/02/16 0308 09/03/16 0554  WBC 10.2 10.4  HGB 13.0 12.4*  HCT 38.8* 36.8*  MCV 86.2 84.8  PLT 207 198   Cardiac Enzymes:  Recent Labs  09/01/16 2246 09/02/16 0308 09/02/16 0935  TROPONINI 0.08* 0.07* 0.07*   BNP: Invalid input(s): POCBNP D-Dimer: No results for input(s): DDIMER in the last 72 hours. Hemoglobin A1C: No results for input(s): HGBA1C in the last 72 hours. Fasting Lipid Panel:  Recent Labs  09/02/16 0308  CHOL 87  HDL 26*  LDLCALC 43  TRIG 92  CHOLHDL 3.3   RADIOLOGY: Dg Chest 2 View  Result Date: 09/01/2016 CLINICAL DATA:  Intermittent chest pain and shortness of breath for the past 2 weeks.  The pain radiates into the shoulders and down to the elbows. History of CABG, gastroesophageal reflux, current smoker. EXAM: CHEST  2 VIEW COMPARISON:  None in PACs FINDINGS: The lungs are well-expanded. There is mild hemidiaphragm flattening. The interstitial markings are coarse. There is no alveolar infiltrate, pleural effusion, or pneumothorax. The heart is top-normal in size. The pulmonary vascularity is not engorged. There is calcification in the wall of the aortic arch.The patient has undergone previous CABG. There are supplemental plate and screw fixation devices over the sternum. The thoracic vertebral bodies are preserved in height where visualized. IMPRESSION: Chronic bronchitic changes. Previous CABG. No pneumonia nor pulmonary edema. Thoracic aortic atherosclerosis. Electronically Signed   By: David  SwazilandJordan M.D.   On: 09/01/2016 16:04    ASSESSMENT AND PLAN:  The patient has symptomatic ischemic VT.  Cath reveals no lesions amenable to revascularization.  Will stop  lidocaine at this time.  Will reach out to the [facility] to see if they can accommodate transfer for BiV ICD.  If not, will place here. Given ischemic VT with structural heart disease, QRS >150 msec, I would advise BiV ICD. Risks, benefits, alternatives to BiVICD implantation were discussed in detail with the patient today. The patient  understands that the risks include but are not limited to bleeding, infection, pneumothorax, perforation, tamponade, vascular damage, renal failure, MI, stroke, death, inappropriate shocks, and lead dislodgement and wishes to proceed.  We will therefore schedule device implantation at the next available time unless he can transfer to [facility].    Hillis RangeJames Nekeshia Lenhardt, MD 09/03/2016 7:57 AM

## 2016-09-03 NOTE — Progress Notes (Addendum)
Patient Name: [name] [name] Date of Encounter: 09/03/2016  Primary Cardiologist: new - previously [facility] (no followup since 2015); He would prefer to follow-up here in [place].  Patient Profile     [name] [name] is a 70 year old male with a past medical history of HTN, HLD, DM II, GERD, PAD and CAD s/p 3v CABG presented with dyspnea on exertion, diaphoresis and burning sensation in the chest.3 Pre-CABG, he had recorded polymorphic VT. Upon admission to [facility], he began to have episodes of wide, tachycardia that was monomorphic. Unclear if it was supraventricular or ventricular tachycardia. The rhythm recurred and Cath Lab, broke with lidocaine. Cardiac cath revealed patent grafts, no culprit lesion but severely reduced EF. EP now on board planning to treat with ICD and likely antiarrhythmics Plan is to also initiate heart failure regimen.  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 Denies chest pain and SOB.   Inpatient Medications    Scheduled Meds: . aspirin EC  81 mg Oral Daily  . atorvastatin  40 mg Oral q1800  .  ceFAZolin (ANCEF) IV  2 g Intravenous To Cath  . chlorhexidine  60 mL Topical Once  . dicloxacillin  500 mg Oral Q8H  . famotidine  20 mg Oral BID  . fluticasone  2 spray Each Nare Daily  .  furosemide  40 mg Intravenous BID  . gentamicin irrigation  80 mg Irrigation To Cath  . insulin aspart  0-15 Units Subcutaneous TID WC  . ipratropium-albuterol  3 mL Nebulization Q6H  . mouth rinse  15 mL Mouth Rinse BID  . metoprolol succinate  25 mg Oral Daily  . pantoprazole  40 mg Oral Daily  . sodium chloride flush  3 mL Intravenous Q12H   Continuous Infusions: . sodium chloride 50 mL/hr at 09/03/16 0900  . sodium chloride Stopped (09/03/16 0800)  . amiodarone 30 mg/hr (09/03/16 0900)   PRN Meds: sodium chloride, acetaminophen, albuterol, nitroGLYCERIN, ondansetron (ZOFRAN) IV, sodium chloride flush   Vital Signs    Vitals:   09/03/16 0630 09/03/16 0800 09/03/16 0804 09/03/16 0815  BP: (!) 120/92 (!) 103/93  114/85  Pulse: 71 73  74  Resp: 18 (!) 24  18  Temp:    97.3 F (36.3 C)  TempSrc:    Oral  SpO2: 100% 100% 100% 97%  Weight:      Height:        Intake/Output Summary (Last 24 hours) at 09/03/16 1019 Last data filed at 09/03/16 0900  Gross per 24 hour  Intake          1297.95 ml  Output             2600 ml  Net         -1302.05 ml   Ceasar Mons  Weights   09/01/16 1528 09/01/16 2200  Weight: 76.2 kg (168 lb) 78.1 kg (172 lb 1.6 oz)    Physical Exam   GEN: Well nourished, well developed, in no acute distress.  HEENT: Grossly normal.  Neck: Supple, no JVD, carotid bruits, or masses. Cardiac: RRR, no murmurs, rubs, or gallops. No clubbing, cyanosis, edema.  Radials/DP/PT 2+ and equal bilaterally.  Respiratory:  Respirations regular and unlabored, clear to auscultation bilaterally. GI: Soft, nontender, nondistended, BS + x 4. MS: no deformity or atrophy. Skin: warm and dry, no rash. Neuro:  Strength and sensation are intact. Psych: AAOx3.  Normal affect.  Labs    CBC  Recent Labs  09/02/16 0308 09/03/16 0554  WBC 10.2 10.4  HGB 13.0 12.4*  HCT 38.8* 36.8*  MCV 86.2 84.8  PLT 207 198   Basic Metabolic Panel  Recent Labs  09/02/16 0308  09/03/16 0554  NA 134* 128*  K 3.9 3.9  CL 100* 91*  CO2 23 26  GLUCOSE 153* 289*  BUN 14 17  CREATININE 1.13 1.36*  CALCIUM 9.2 8.7*  MG 1.3* 1.7   Cardiac Enzymes  Recent Labs  09/01/16 2246 09/02/16 0308 09/02/16 0935  TROPONINI 0.08* 0.07* 0.07*     Recent Labs  09/02/16 0308  CHOL 87  HDL 26*  LDLCALC 43  TRIG 92  CHOLHDL 3.3    Telemetry    NSR - no further arrhythmia - Personally Reviewed [facility])  ECG    Polymorphic VT vs. Afib with aberrancy; Difficult to interpret - Personally Reviewed along with EP -- cannot exclude salvos of VT or PAF/PAT with aberrancy given underlying RBBB [facility])   Cardiology     Cardiac Cath 12/14: Severe LV dysfunction EF of 15-20%.  Severely elevated LVEDP  Proximal RCA 100%- patent SVG-rPDA (retrograde fills PL system)  60% distal left main and ostial circumflex, 90% mid LAD  Widely patent SVG-OM1/ramus  Widely patent LIMA-LAD   2-D Echo 12/14:  Mild LV dilation. Severely reduced EF of roughly 20% with diffuse hypokinesis. Akinesis of the inferior posterior wall. Grade 3 diastolic dysfunction (restrictive physiology with high filling pressures), moderate-severe MR. Severely reduced RV function. Peak PA pressure 59 mm.  Radiology   . No new studies  Assessment & Plan    Wide complex tachycardia / Ventricular Tachycardia: - ICD today Hemodynamically stable white count tachycardia with salvos of what looked like VT followed by a sustained rapid biconvex tachycardia in the 170-190 BP. Range after amiodarone. Initially treated with procainamide.  EP consulted, and recommended cardiac catheterization initially as this appeared to be an ischemic rhythm. --> Now EP on board with full consult.  During cardiac catheterization, after access was obtained, the patient went back into what appeared to be a 1-1 aflutter versus VT with rates in the 170s. He did not respond to amiodarone bolus, unsuccessful cardioversion 2. Finally  converted after lidocaine was administered.  Lidocaine drip was discontinued overnight. Given LV Gram and echocardiographic findings of severe cardiomyopathy, in light of his likely VT, EP plans to place ICD today.  Progressive Angina (atypical)-- mild troponin elevation with concerning rhythms o/n; Need to exclude ischemic etiology (? If this is the etiology of his pre-CABG arrhythmia) -  Initial presenting Sx were concerning for Progressive angina & with abnormal rhythm o/n concerning for ischemic WCT, needs cath -- [facility], -- Cardiac catheterization revealed widely patent grafts with occluded native RCA and left main disease. No culprit lesion to explain for the  arrhythmias.  IV Heparin off  On ASA & statin + BB  Acute on chronic diastolic CHF, NYHA class III-IV: Laying mostly flat without orthopnea - but severely elevated LVEDP on cath.  IV lasix 40mg  IV BID.    He is on Toprol.   At present blood pressure will not allow addition of ARB, however we will try to start Prior to discharge.  We'll need to watch creatinine trend post cath, would hold off on ARB and spironolactone until we see renal function stablize  Hyperkalemia: Resolved. K is 3.9 this am.  HypoMag - will order standing REPLETION  DM - on SSI - relatively well-controlled. Can restart oral medications following ICD  He is wheezing a lot - CXR suggests Chronic Bronchitis -- will order Nebs & Inhaled Steroid.  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  Smoking cessation counseling   He will probably need to have additional time to adjust heart failure medications with some diuresis. Although he may be related for discharge post ICD tomorrow I would suspect that he would probably need additional titration of artery medications. He also indicated    Bryan Lemma, M.D., M.S. Interventional Cardiologist   Pager # [phone] Phone # [phone] [address] [place], [place] [address]

## 2016-09-03 NOTE — Progress Notes (Addendum)
Last evening the patient/family mentioned cost/coverage-wise they understood would be better to transfer to [facility] facility if stable to do so, in discussion last night with dr. Ladona Ridgel, he felt that he was stable to transfer but they stated their preference would be to stay.   They were going to discuss this last night and let us know.   I revisited this morning with the patient and wife regarding transfer to [place] facility for further care and ICD implant, they state that in discussion last night with their daughter they would prefer not to transfer and proceed here with his care and implant today.  Francis Dowse, PA-C  EP Attending  Agree with above.   Leonia Reeves.D.

## 2016-09-04 ENCOUNTER — Inpatient Hospital Stay (HOSPITAL_COMMUNITY): Payer: Medicare Other

## 2016-09-04 ENCOUNTER — Encounter (HOSPITAL_COMMUNITY): Payer: Self-pay | Admitting: Nurse Practitioner

## 2016-09-04 DIAGNOSIS — R778 Other specified abnormalities of plasma proteins: Secondary | ICD-10-CM

## 2016-09-04 DIAGNOSIS — R7989 Other specified abnormal findings of blood chemistry: Secondary | ICD-10-CM

## 2016-09-04 LAB — CBC
HEMATOCRIT: 36.8 % — AB (ref 39.0–52.0)
Hemoglobin: 12.4 g/dL — ABNORMAL LOW (ref 13.0–17.0)
MCH: 28.7 pg (ref 26.0–34.0)
MCHC: 33.7 g/dL (ref 30.0–36.0)
MCV: 85.2 fL (ref 78.0–100.0)
PLATELETS: 192 10*3/uL (ref 150–400)
RBC: 4.32 MIL/uL (ref 4.22–5.81)
RDW: 13.5 % (ref 11.5–15.5)
WBC: 12.1 10*3/uL — AB (ref 4.0–10.5)

## 2016-09-04 LAB — GLUCOSE, CAPILLARY: Glucose-Capillary: 184 mg/dL — ABNORMAL HIGH (ref 65–99)

## 2016-09-04 MED ORDER — FUROSEMIDE 40 MG PO TABS: 40.0000 mg | ORAL_TABLET | Freq: Every day | ORAL | 6 refills | Status: DC

## 2016-09-04 MED ORDER — AMIODARONE HCL 200 MG PO TABS: ORAL_TABLET | ORAL | 3 refills | Status: DC

## 2016-09-04 MED ORDER — AMIODARONE HCL 200 MG PO TABS
400.0000 mg | ORAL_TABLET | Freq: Two times a day (BID) | ORAL | Status: DC
  Administered 2016-09-04: 400 mg via ORAL
  Filled 2016-09-04: qty 2

## 2016-09-04 MED ORDER — CARVEDILOL 6.25 MG PO TABS: 6.2500 mg | ORAL_TABLET | Freq: Two times a day (BID) | ORAL | 6 refills | Status: DC

## 2016-09-04 MED ORDER — VALSARTAN 40 MG PO TABS: 40.0000 mg | ORAL_TABLET | Freq: Every day | ORAL | 6 refills | Status: DC

## 2016-09-04 MED ORDER — METFORMIN HCL 1000 MG PO TABS: 1000.0000 mg | ORAL_TABLET | Freq: Two times a day (BID) | ORAL | Status: DC

## 2016-09-04 MED ORDER — CARVEDILOL 6.25 MG PO TABS
6.2500 mg | ORAL_TABLET | Freq: Two times a day (BID) | ORAL | 6 refills | Status: DC
Start: 2016-09-04 — End: 2018-01-25

## 2016-09-04 MED ORDER — CARVEDILOL 3.125 MG PO TABS
6.2500 mg | ORAL_TABLET | Freq: Two times a day (BID) | ORAL | Status: DC
Start: 2016-09-04 — End: 2016-09-04
  Administered 2016-09-04: 10:00:00 6.25 mg via ORAL
  Filled 2016-09-04: qty 2

## 2016-09-04 MED ORDER — NITROGLYCERIN 0.4 MG SL SUBL: 0.4000 mg | SUBLINGUAL_TABLET | SUBLINGUAL | 3 refills | Status: DC | PRN

## 2016-09-04 MED ORDER — MAGNESIUM OXIDE 400 (241.3 MG) MG PO TABS: 400.0000 mg | ORAL_TABLET | Freq: Every day | ORAL | 6 refills | Status: DC

## 2016-09-04 NOTE — Discharge Instructions (Signed)
**PLEASE REMEMBER TO BRING ALL OF YOUR MEDICATIONS TO EACH OF YOUR FOLLOW-UP OFFICE VISITS.  NO HEAVY LIFTING X 2 WEEKS. NO SEXUAL ACTIVITY X 2 WEEKS. NO DRIVING X 1 WEEK. NO SOAKING BATHS, HOT TUBS, POOLS, ETC., X 7 DAYS.   Radial Site Care Refer to this sheet in the next few weeks. These instructions provide you with information on caring for yourself after your procedure. Your caregiver may also give you more specific instructions. Your treatment has been planned according to current medical practices, but problems sometimes occur. Call your caregiver if you have any problems or questions after your procedure. HOME CARE INSTRUCTIONS  You may shower the day after the procedure.Remove the bandage (dressing) and gently wash the site with plain soap and water.Gently pat the site dry.   Do not apply powder or lotion to the site.   Do not submerge the affected site in water for 3 to 5 days.   Inspect the site at least twice daily.   Do not flex or bend the affected arm for 24 hours.   No lifting over 5 pounds (2.3 kg) for 5 days after your procedure.   Do not drive home if you are discharged the same day of the procedure. Have someone else drive you.   What to expect:  Any bruising will usually fade within 1 to 2 weeks.   Blood that collects in the tissue (hematoma) may be painful to the touch. It should usually decrease in size and tenderness within 1 to 2 weeks.  SEEK IMMEDIATE MEDICAL CARE IF:  You have unusual pain at the radial site.   You have redness, warmth, swelling, or pain at the radial site.   You have drainage (other than a small amount of blood on the dressing).   You have chills.   You have a fever or persistent symptoms for more than 72 hours.   You have a fever and your symptoms suddenly get worse.   Your arm becomes pale, cool, tingly, or numb.   You have heavy bleeding from the site. Hold pressure on the site.   _____________       Supplemental  Discharge Instructions for  Pacemaker/Defibrillator Patients  Activity No heavy lifting or vigorous activity with your left/right arm for 6 to 8 weeks.  Do not raise your left/right arm above your head for one week.  Gradually raise your affected arm as drawn below.             09/07/16                  09/08/16                   09/09/16                 09/10/16 __  NO DRIVING for 1 week  WOUND CARE - Keep the wound area clean and dry.  Do not get this area wet for one week. No showers for one week; you may shower on 09/10/16  - The tape/steri-strips on your wound will fall off; do not pull them off.  No bandage is needed on the site.  DO  NOT apply any creams, oils, or ointments to the wound area. - If you notice any drainage or discharge from the wound, any swelling or bruising at the site, or you develop a fever > 101? F after you are discharged home, call the office at once.  Special Instructions - You  are still able to use cellular telephones; use the ear opposite the side where you have your pacemaker/defibrillator.  Avoid carrying your cellular phone near your device. - When traveling through airports, show security personnel your identification card to avoid being screened in the metal detectors.  Ask the security personnel to use the hand wand. - Avoid arc welding equipment, MRI testing (magnetic resonance imaging), TENS units (transcutaneous nerve stimulators).  Call the office for questions about other devices. - Avoid electrical appliances that are in poor condition or are not properly grounded. - Microwave ovens are safe to be near or to operate.  Additional information for defibrillator patients should your device go off: - If your device goes off ONCE and you feel fine afterward, notify the device clinic nurses. - If your device goes off ONCE and you do not feel well afterward, call 911. - If your device goes off TWICE, call 911. - If your device goes off THREE times in  one day, call 911.  DO NOT DRIVE YOURSELF OR A FAMILY MEMBER WITH A DEFIBRILLATOR TO THE HOSPITAL--CALL 911.

## 2016-09-04 NOTE — Discharge Summary (Signed)
Discharge Summary    Patient ID: [name] [name],  MRN: [id number], DOB/AGE: January 13, 1946 70 y.o.  Admit date: 09/01/2016 Discharge date: 09/04/2016  Primary Care Provider: Charolett Bumpers Primary Cardiologist: Ranae Palms, MD / G. Ladona Ridgel, MD (EP)  Discharge Diagnoses    Principal Problem:   Coronary artery disease involving native coronary artery of native heart with angina pectoris (HCC)  **Status post catheterization this admission revealing patent grafts.  Active Problems:   S/P CABG x 3   Ventricular tachycardia (HCC)  **Status post biventricular AICD this admission.  **Amiodarone therapy initiated.   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, but Also Potentially Arrhythmogenic   Acute on chronic combined systolic and diastolic CHF, NYHA class 3 (HCC)  **Discharge weight 145 pounds.   Diabetes mellitus type II, non insulin dependent (HCC)   Essential hypertension   Tobacco use disorder   Hyperkalemia - resolved   Hyperlipidemia associated with type 2 diabetes mellitus (HCC)   PAD (peripheral artery disease) (HCC)   Hypomagnesemia   Elevated troponin  Allergies No Known Allergies  Diagnostic Studies/Procedures    2D Echocardiogram 12.14.2017   LVEF is severely depressed at approximately 20% with dffuse hypokinesis; inferior and posterior akinesis The cavity size was mildly dilated. Wall thickness was increased in a pattern of mild LVH. Doppler parameters are consistent with restrictive physiology, indicative of decreased left ventricular diastolic compliance and/or increased left atrial pressure. - Mitral valve: There was moderate to severe regurgitation. - Left atrium: The atrium was severely dilated. - Right ventricle: Systolic function was severely reduced. - Right atrium: The atrium was mildly dilated. - Atrial septum: There was increased thickness of the septum,   consistent with lipomatous hypertrophy. - Tricuspid valve: There was  moderate regurgitation. - Pulmonary arteries: PA peak pressure: 59 mm Hg (S). _____________   Cardiac Catheterization 12.14.2017  Coronary Findings  Dominance: Right  Left Main  Ost LM to LM lesion, 60% stenosed.  Left Anterior Descending  Mid LAD lesion, 99% stenosed.  First Diagonal Branch  Vessel is large in size.  Second Diagonal Branch  Vessel is small in size.  Third Diagonal Branch  Vessel is small in size.  Left Circumflex  Ost Cx to Prox Cx lesion, 60% stenosed.  First Obtuse Marginal Branch  Vessel is large in size.  Second Obtuse Marginal Branch  Vessel is moderate in size.  Third Obtuse Marginal Branch  Vessel is moderate in size.  Right Coronary Artery  Prox RCA lesion, 100% stenosed. The lesion is chronically occluded.  Graft Angiography  saphenous Graft to RPDA  SVG graft was visualized by angiography.  Origin to Prox Graft lesion, 50% stenosed. The entire graft is small. There is a proximal stenosis followed by dilation of the vein graft (likely at a valve). The remainder of the graft is almost the same size as the proximal body.  saphenous Graft to 1st Mrg  SVG graft was visualized by angiography.  Free LIMA Graft to Mid LAD  LIMA graft was visualized by angiography.  Left Heart  Left Ventricle The left ventricle is severely dilated. There is severe left ventricular systolic dysfunction. LV end diastolic pressure is severely elevated. The left ventricular ejection fraction is less than 25% by visual estimate. There are LV function abnormalities due to global hypokinesis. There is no evidence of mitral regurgitation.    _____________   Automated Implantable Cardioverter Defibrillator Implant 12.15.2017  St. 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 He has not followed up with cardiology since 2015. He is recently been experiencing increasing symptoms of chest discomfort and dyspnea. He has also had sensations of chest burning associated with diaphoresis which were similar to prior anginal symptoms. As result, he presented to the [facility] ED on December 13. There, ECG showed right bundle branch block of unknown duration. Troponins were initially normal. He was mildly hyperkalemic with a potassium of 5.3. He was admitted for further evaluation.  Hospital Course     Consultants: Electrophysiology   Following admission, he had mild elevation of troponin. Echocardiogram showed severe LV dysfunction with an EF of 20%. Decision was made to pursue diagnostic catheterization. Prior to catheterization however, he developed wide-complex tachycardia, which was hemodynamically stable. He was initially treated with intravenous metoprolol and amiodarone but due to worsening tachycardia, he was switched to procainamide.  He underwent diagnostic catheterization on December 14 revealing 3 of 3 patent grafts with severe obstructive native coronary artery disease. Overall, he was felt to be well revascularized. EF was 25%.   During catheterization, he had recurrent wide-complex tachycardia. There was concern for possible SVT with accessory pathway and he was initially treated with adenosine without change in heart rate. He was then bolused with amiodarone and started on amiodarone infusion. He remained in a wide-complex tachycardia during the procedure and was cardioverted 2 without change and rhythm. He was then given an IV lidocaine bolus and converted to sinus rhythm.   Post catheterization, he was seen by electrophysiology  who felt that the rhythm represented sustained ventricular tachycardia. In the setting of VT with ischemic cardiomyopathy and no targets for intervention on cath, it was felt that he would require AICD placement. He remained on IV lidocaine and subsequently underwent successful placement of a [employer]. Jude biventricular AICD.  He tolerated this procedure well and postprocedure, intravenous lidocaine was discontinued. He has had recurrence of asymptomatic ventricular tachycardia and has been placed on oral amiodarone therapy.  Patient has been diuresed throughout admission. He is a -3.8 L and his discharge weight is 145 pounds. We have changed him from metoprolol to carvedilol and have resumed a lower dose of valsartan in the setting of hyperkalemia on admission. We have arranged for follow-up early next week and he will need a basic metabolic panel at that time. If he is hemodynamically stable, we will likely look to transition him to [facility]. He will be discharged home today in good condition. _____________  Discharge Vitals Blood pressure 130/81, pulse 80, temperature 98.3 F (36.8 C), temperature source Oral, resp. rate 16, height 5\' 6"  (1.676 m), weight 145 lb 4.5 oz (65.9 kg), SpO2 92 %.  Filed Weights   09/01/16 1528 09/01/16 2200 09/04/16 0119  Weight: 168 lb (76.2 kg) 172 lb 1.6 oz (78.1 kg) 145 lb 4.5 oz (65.9 kg)    Labs & Radiologic Studies    CBC  Recent Labs  09/03/16 0554 09/04/16 0215  WBC 10.4 12.1*  HGB 12.4* 12.4*  HCT 36.8* 36.8*  MCV 84.8 85.2  PLT 198 192   Basic Metabolic Panel  Recent Labs  09/02/16 0308 09/03/16 0554  NA 134* 128*  K 3.9 3.9  CL 100* 91*  CO2 23 26  GLUCOSE 153* 289*  BUN 14 17  CREATININE 1.13 1.36*  CALCIUM 9.2 8.7*  MG 1.3* 1.7   Liver Function Tests Lab Results  Component Value Date   ALT 11 03/03/2012   AST 16 03/03/2012   ALKPHOS 69 03/03/2012   BILITOT 0.5 03/03/2012    Cardiac Enzymes  Recent Labs  09/01/16 2246  09/02/16 0308 09/02/16 0935  TROPONINI 0.08* 0.07* 0.07*   Fasting Lipid Panel  Recent Labs  09/02/16 0308  CHOL 87  HDL 26*  LDLCALC 43  TRIG 92  CHOLHDL 3.3  _____________  Dg Chest 2 View  Result Date: 09/04/2016 CLINICAL DATA:  70 year old male with ICD placement. EXAM: CHEST  2 VIEW COMPARISON:  Chest radiograph dated 09/01/2016 FINDINGS: There is cardiomegaly with interval increase in the size of the cardiopericardial silhouette since the prior radiograph. There has been interval placement of a duo lead AICD device with generator in the left upper chest. There is no pneumothorax. The lungs are clear. There is no pleural effusion. Median sternotomy wires and additional fixation plate and screws noted over the sternum. No acute osseous pathology. IMPRESSION: Interval placement of a dual lead AICD device.  No pneumothorax. Mild interval increase in the cardiopericardial silhouette. Electronically Signed   By: Elgie Collard M.D.   On: 09/04/2016 06:24   Dg Chest 2 View  Result Date: 09/01/2016 CLINICAL DATA:  Intermittent chest pain and shortness of breath for the past 2 weeks. The pain radiates into the shoulders and down to the elbows. History of CABG, gastroesophageal reflux, current smoker. EXAM: CHEST  2 VIEW COMPARISON:  None in PACs FINDINGS: The lungs are well-expanded. There is mild hemidiaphragm flattening. The interstitial markings are coarse. There is no alveolar infiltrate, pleural effusion, or pneumothorax. The heart is top-normal in size. The pulmonary vascularity is not engorged. There is calcification in the wall of the aortic arch.The patient has undergone previous CABG. There are supplemental plate and screw fixation devices over the sternum. The thoracic vertebral bodies are preserved in height where visualized. IMPRESSION: Chronic bronchitic changes. Previous CABG. No pneumonia nor pulmonary edema. Thoracic aortic atherosclerosis. Electronically Signed   By: David   [place] M.D.   On: 09/01/2016 16:04   Disposition   Pt is being discharged home today in good condition.  Follow-up Plans & Appointments    Follow-up Information    [facility] [address] [facility] Follow up on 09/16/2016.   Specialty:  Cardiology Why:  10:30AM, wound check Contact information: [address], [address] [place] [address] [phone]       Lewayne Bunting, MD Follow up on 12/14/2016.   Specialty:  Cardiology Why:  9:15AM Contact information: [address] [place] [address] [phone]        Azalee Course, [place] Follow up on 09/08/2016.   Specialties:  Cardiology, Radiology Why:  9:30 AM - Dr. Elissa Hefty PA Contact information: [address] [place] [address] [phone]        Gypsy Balsam, NP Follow up on 10/27/2016.   Specialty:  Cardiology Why:  8:40 AM - Dr. Lubertha Basque Nurse Practitioner. Contact information: [address] [place] [address] [phone]           Discharge Medications   Current Discharge Medication List    START taking these medications   Details  amiodarone (PACERONE) 200 MG tablet 2 tabs twice daily x  1 week, then 1 tab twice daily x 2 wks, then 1 tab daily. Qty: 65 tablet, Refills: 3    carvedilol (COREG) 6.25 MG tablet Take 1 tablet (6.25 mg total) by mouth 2 (two) times daily with a meal. Qty: 60 tablet, Refills: 6    furosemide (LASIX) 40 MG tablet Take 1 tablet (40 mg total) by mouth daily. Qty: 30 tablet, Refills: 6    magnesium oxide (MAG-OX) 400 (241.3 Mg) MG tablet Take 1 tablet (400 mg total) by mouth daily. Qty: 30 tablet, Refills: 6    nitroGLYCERIN (NITROSTAT) 0.4 MG SL tablet Place 1 tablet (0.4 mg total) under the tongue every 5 (five) minutes x 3 doses as needed for chest pain. Qty: 25 tablet, Refills: 3      CONTINUE these medications which have CHANGED   Details  metFORMIN (GLUCOPHAGE) 1000 MG tablet Take 1 tablet (1,000 mg total) by  mouth 2 (two) times daily with a meal. Resume on 09/06/2016    valsartan (DIOVAN) 40 MG tablet Take 1 tablet (40 mg total) by mouth daily. Qty: 30 tablet, Refills: 6      CONTINUE these medications which have NOT CHANGED   Details  albuterol (PROVENTIL HFA) 108 (90 Base) MCG/ACT inhaler Inhale 1-2 puffs into the lungs every 6 (six) hours as needed for wheezing or shortness of breath.    aspirin EC 81 MG tablet Take 81 mg by mouth daily.      atorvastatin (LIPITOR) 80 MG tablet Take 40 mg by mouth at bedtime.    Cholecalciferol (VITAMIN D-3 PO) Take 2,000 mg by mouth daily.     dicloxacillin (DYNAPEN) 250 MG capsule Take 500 mg by mouth 3 (three) times daily.    fluticasone (FLONASE ALLERGY RELIEF) 50 MCG/ACT nasal spray Place 2 sprays into both nostrils daily as needed for allergies or rhinitis.    glipiZIDE (GLUCOTROL) 5 MG tablet Take 2.5 mg by mouth daily before breakfast.    omeprazole (PRILOSEC) 20 MG capsule Take 20 mg by mouth daily.    ranitidine (ZANTAC) 150 MG tablet Take 150 mg by mouth daily as needed for heartburn.       STOP taking these medications     metoprolol tartrate (LOPRESSOR) 25 MG tablet      naproxen (NAPROSYN) 250 MG tablet          Aspirin prescribed at discharge?  Yes High Intensity Statin Prescribed? (Lipitor 40-80mg  or Crestor 20-40mg ): Yes Beta Blocker Prescribed? Yes For EF <40%, was ACEI/ARB Prescribed? Yes ADP Receptor Inhibitor Prescribed? (i.e. Plavix etc.-Includes Medically Managed Patients): No: minimal troponin elevation in setting of VT/no PCI. For EF <40%, Aldosterone Inhibitor Prescribed? No: Hyperkalemia on admission. Was EF assessed during THIS hospitalization? Yes Was Cardiac Rehab II ordered? (Included Medically managed Patients): Yes   Outstanding Labs/Studies   F/u BMET on 12/20.  Duration of Discharge Encounter   Greater than 30 minutes including physician time.  Signed, Nicolasa Duckinghristopher Yuka Lallier NP 09/04/2016, 9:31  AM

## 2016-09-04 NOTE — Progress Notes (Signed)
Dr. Tiburcio Pea paged r/t swelling at ICD insertion site, pressure dressing applied by RN. patient has had multiple non sustained runs of v-tach and wide QRS arrhythmia. Dr. Tiburcio Pea is aware, will continue to monitor for sustained. Patient does not display s/s associated with arrhythmia

## 2016-09-04 NOTE — Progress Notes (Signed)
Patient had a run of VT, asymptomatic. Ward Givens NP notified, starting on amiodarone po this am no other treatment at this time.

## 2016-09-04 NOTE — Progress Notes (Signed)
Patient Name: [name] [name]      SUBJECTIVE: Admitted with WCT consisttent with VT ins etting of CAD with prior CABG Severe LV dysfunction <25% with patent grafts at cath  Rx with amio and lido and tehn just with amio  Underwent CRT-D with RBBB yday   Denies chest pain and sob     Past Medical History:  Diagnosis Date  . Acid reflux   . AICD (automatic cardioverter/defibrillator) present   . Complication of anesthesia    "was told I responded well to anesthesia"  . Coronary artery disease involving native coronary artery of native heart with angina pectoris (HCC) 10/2012   History of perioperative MI after aortobifem bypass; found to have multivessel CAD and referred for CABG x3  . Essential hypertension 03/03/2012  . Family history of adverse reaction to anesthesia    "was told I responded well to anesthesia"  . History of blood transfusion 11/2012   "during his CABG"  . History of gout X 1  . History of stomach ulcers 1970s  . Hypercholesteremia   . Hyperlipidemia associated with type 2 diabetes mellitus (HCC) 09/16/2011  . Myocardial infarction    "was told he had evidence of a possible MI before his CABG"  . PAD (peripheral artery disease) (HCC)    s/p aortobifemoral bypass 10/2012  . PTSD (post-traumatic stress disorder)   . 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 - Concern for abnormal nuclear stress test;; b. re-do sternotomy in 2015 for ? sternan non-union requiting plate  . Type II diabetes mellitus (HCC)    "takes pills" (09/03/2016)  . 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 Is now on chronic suppressive antibiotics with dicloxacillin    Scheduled Meds:  Scheduled Meds: . aspirin EC  81 mg Oral Daily  .  atorvastatin  40 mg Oral q1800  . atorvastatin  40 mg Oral QHS  .  ceFAZolin (ANCEF) IV  1 g Intravenous Q6H  . dicloxacillin  500 mg Oral Q8H  . famotidine  20 mg Oral BID  . fluticasone  2 spray Each Nare Daily  . furosemide  40 mg Intravenous BID  . glipiZIDE  2.5 mg Oral QAC breakfast  . insulin aspart  0-15 Units Subcutaneous TID WC  . ipratropium-albuterol  3 mL Nebulization Q6H  . magnesium oxide  400 mg Oral Daily  . mouth rinse  15 mL Mouth Rinse BID  . metFORMIN  1,000 mg Oral BID WC  . metoprolol tartrate  25 mg Oral BID  . naproxen  250 mg Oral QHS  . nicotine  7 mg Transdermal Daily  . pantoprazole  40 mg Oral Daily  . potassium chloride  20 mEq Oral Daily  . sodium chloride flush  3 mL Intravenous Q12H   Continuous Infusions: . amiodarone Stopped (09/03/16 1420)   sodium chloride, acetaminophen, albuterol, albuterol, fluticasone, nitroGLYCERIN, ondansetron (ZOFRAN) IV, sodium chloride flush    PHYSICAL EXAM Vitals:   09/03/16 2000 09/03/16 2100 09/04/16 0119 09/04/16 0700  BP: 120/80 129/78 (!) 140/93 130/81  Pulse: 71 75 84 80  Resp: 12 20 15 16   Temp:   97 F (36.1 C) 98.3 F (36.8 C)  TempSrc:   Oral Oral  SpO2: 96% 93% 98% 92%  Weight:  145 lb 4.5 oz (65.9 kg)   Height:        Well developed and nourished in no acute distress HENT normal Neck supple with JVP-flat Clear Pocket without  hematoma, swelling or tenderness  Regular rate and rhythm, no murmurs or gallops Abd-soft with active BS No Clubbing cyanosis edema Skin-warm and dry A & Oriented  Grossly normal sensory and motor function   TELEMETRY: Reviewed personnally pt in sinus :    Chest Xray personally reviewed  Stable lead function   Intake/Output Summary (Last 24 hours) at 09/04/16 0740 Last data filed at 09/04/16 0322  Gross per 24 hour  Intake           824.47 ml  Output             2650 ml  Net         -1825.53 ml    LABS: Basic Metabolic Panel:  Recent Labs Lab  09/01/16 1521 09/02/16 0308 09/03/16 0554  NA 133* 134* 128*  K 5.3* 3.9 3.9  CL 101 100* 91*  CO2 22 23 26   GLUCOSE 244* 153* 289*  BUN 15 14 17   CREATININE 1.30* 1.13 1.36*  CALCIUM 9.6 9.2 8.7*  MG  --  1.3* 1.7   Cardiac Enzymes:  Recent Labs  09/01/16 2246 09/02/16 0308 09/02/16 0935  TROPONINI 0.08* 0.07* 0.07*   CBC:  Recent Labs Lab 09/01/16 1521 09/02/16 0308 09/03/16 0554 09/04/16 0215  WBC 13.1* 10.2 10.4 12.1*  HGB 14.1 13.0 12.4* 12.4*  HCT 41.2 38.8* 36.8* 36.8*  MCV 87.5 86.2 84.8 85.2  PLT 243 207 198 192   PROTIME:  Recent Labs  09/02/16 0434  LABPROT 16.1*  INR 1.28   Liver Function Tests: No results for input(s): AST, ALT, ALKPHOS, BILITOT, PROT, ALBUMIN in the last 72 hours. No results for input(s): LIPASE, AMYLASE in the last 72 hours. BNP: BNP (last 3 results)  Recent Labs  09/01/16 1808  BNP 1,502.7*    ProBNP (last 3 results) No results for input(s): PROBNP in the last 8760 hours.  D-Dimer: No results for input(s): DDIMER in the last 72 hours. Hemoglobin A1C: No results for input(s): HGBA1C in the last 72 hours. Fasting Lipid Panel:  Recent Labs  09/02/16 0308  CHOL 87  HDL 26*  LDLCALC 43  TRIG 92  CHOLHDL 3.3   Thyroid Function Tests: No results for input(s): TSH, T4TOTAL, T3FREE, THYROIDAB in the last 72 hours.  Invalid input(s): FREET3 Anemia Panel: No results for input(s): VITAMINB12, FOLATE, FERRITIN, TIBC, IRON, RETICCTPCT in the last 72 hours.   Device Interrogation: normal    ASSESSMENT AND PLAN:  Principal Problem:   Ventricular tachycardia (HCC) Active Problems:   Diabetes mellitus type II, non insulin dependent (HCC)   Hyperlipidemia associated with type 2 diabetes mellitus (HCC)   Tobacco use disorder   Atypical angina (HCC)   Coronary artery disease involving native coronary artery of native heart with angina pectoris (HCC)   PAD (peripheral artery disease) (HCC)   S/P CABG x 3    Acute on chronic heart failure (HCC)   Hyperkalemia - resolved   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, but Also Potentially Arrhythmogenic   Acute on chronic combined systolic and diastolic CHF, NYHA class 3 (HCC)   Hypomagnesemia  Will switch metop >> carvedilol Will add low dose losartan back with careful followup required for hyperkalemia Have asked GT/JA about antiarrhhtyhmic drugs>> continue amio for VT and AFib Discussed followup  [facility]   Signed, Sherryl Manges MD  09/04/2016

## 2016-09-06 ENCOUNTER — Encounter (HOSPITAL_COMMUNITY): Payer: Self-pay | Admitting: Internal Medicine

## 2016-09-06 ENCOUNTER — Telehealth: Payer: Self-pay | Admitting: *Deleted

## 2016-09-06 NOTE — Telephone Encounter (Signed)
VT with ATP 09/04/16 at 6:15pm after hospital discharge.  Spoke with Ms. Cochrane- she said that Mr. Tlaseca was sleeping but he did not mention any symptoms. I encouraged continued use of Amiodarone and Coreg as prescribed and to call back to device clinic if needed. Confirmed wound check 09/16/16.

## 2016-09-08 ENCOUNTER — Telehealth: Payer: Self-pay | Admitting: Physician Assistant

## 2016-09-08 ENCOUNTER — Encounter: Payer: Self-pay | Admitting: Physician Assistant

## 2016-09-08 ENCOUNTER — Ambulatory Visit (INDEPENDENT_AMBULATORY_CARE_PROVIDER_SITE_OTHER): Payer: Medicare Other | Admitting: Physician Assistant

## 2016-09-08 VITALS — BP 129/79 | HR 65 | Ht 66.0 in | Wt 161.8 lb

## 2016-09-08 DIAGNOSIS — I472 Ventricular tachycardia, unspecified: Secondary | ICD-10-CM

## 2016-09-08 DIAGNOSIS — Z79899 Other long term (current) drug therapy: Secondary | ICD-10-CM | POA: Diagnosis not present

## 2016-09-08 DIAGNOSIS — I739 Peripheral vascular disease, unspecified: Secondary | ICD-10-CM

## 2016-09-08 DIAGNOSIS — E118 Type 2 diabetes mellitus with unspecified complications: Secondary | ICD-10-CM

## 2016-09-08 DIAGNOSIS — I10 Essential (primary) hypertension: Secondary | ICD-10-CM | POA: Diagnosis not present

## 2016-09-08 DIAGNOSIS — I25810 Atherosclerosis of coronary artery bypass graft(s) without angina pectoris: Secondary | ICD-10-CM | POA: Diagnosis not present

## 2016-09-08 DIAGNOSIS — I5022 Chronic systolic (congestive) heart failure: Secondary | ICD-10-CM

## 2016-09-08 DIAGNOSIS — E785 Hyperlipidemia, unspecified: Secondary | ICD-10-CM

## 2016-09-08 LAB — COMPREHENSIVE METABOLIC PANEL
ALBUMIN: 4.4 g/dL (ref 3.6–5.1)
ALK PHOS: 75 U/L (ref 40–115)
ALT: 30 U/L (ref 9–46)
AST: 16 U/L (ref 10–35)
BILIRUBIN TOTAL: 0.8 mg/dL (ref 0.2–1.2)
BUN: 11 mg/dL (ref 7–25)
CALCIUM: 9.9 mg/dL (ref 8.6–10.3)
CO2: 28 mmol/L (ref 20–31)
Chloride: 96 mmol/L — ABNORMAL LOW (ref 98–110)
Creat: 1.19 mg/dL (ref 0.70–1.25)
Glucose, Bld: 62 mg/dL — ABNORMAL LOW (ref 65–99)
POTASSIUM: 5 mmol/L (ref 3.5–5.3)
Sodium: 135 mmol/L (ref 135–146)
Total Protein: 6.7 g/dL (ref 6.1–8.1)

## 2016-09-08 LAB — TSH: TSH: 6.87 mIU/L — ABNORMAL HIGH (ref 0.40–4.50)

## 2016-09-08 MED ORDER — SACUBITRIL-VALSARTAN 49-51 MG PO TABS: 1.0000 | ORAL_TABLET | Freq: Two times a day (BID) | ORAL | 5 refills | Status: DC

## 2016-09-08 NOTE — Progress Notes (Signed)
Cardiology Office Note    Date:  09/08/2016   ID:  [name] [name], DOB March 26, 1946, MRN [id number]  PCP:  Charolett Bumpers, PA-C  Cardiologist:  Dr. Herbie Baltimore / Dr. Ladona Ridgel - previously [facility] (no followup since 2015)  Chief Complaint  Patient presents with  . Hospitalization Follow-up    seen for Dr. Herbie Baltimore, previously followed at [facility]. h/o CAD and severe ICM s/p recent CRT-D    History of Present Illness:  [name] [name] is a 70 y.o. male with PMH of HTN, HLD, DM II, GERD, PAD and CAD s/p 3v CABG. Per patient, he underwent aortobifemoral bypass surgery for lower extremity PAD in February 2014. He did not have a stress test prior to the surgery, however after the surgery during recovery period, he had an episode of VT which prompted further ischemic workup eventually led to the cardiac catheterization and also bypass surgery in March 2014 at [facility] in [place]. A few month afterward, he did have a relook cardiac cath around July 2014 due to concern of graft stenosis. Due to nonhealing sternal wound and a staph infection, he underwent sternal steel plating placement and has been placed on lifelong dicloxacillin since then. He was admitted to cardiology service with plan for cardiac catheterization on the following day.  Overnight, he had a run of wide complex tachycardia. It was felt to wide complex tachycardia could be atrial fibrillation versus SVT with bundle branch block however also concerning for VT. He was given amiodarone bolus and started on procainamide. He did undergo cardiac catheterization on 09/02/2016 which showed patent LIMA to LAD, patent SVG to OM, patent SVG to RCA, EF 10-15%. He did develop another episode of wide complex tachycardia in the cath lab, and was shocked twice and given amiodarone bolus 2 along with a dose of adenosine. It eventually converted on IV lidocaine. Echocardiogram obtained on the same day showed EF approx 20%, mild LVH, Doppler parameters  consistent with restrictive physiology, moderate to severe MR, moderate TR, PA peak pressure 59 mmHg. He was seen by Dr. Ladona Ridgel with electrophysiology service, there does not seems to be any secondary cause for VT. ICD was recommended, patient eventually agreed to proceed with the procedure. He eventually underwent St. Jude CRT-D placement on 09/03/2016. He has been diuresed throughout the admission and prior to discharge, he was -3.8 L. discharge weight was 145 pounds. Given significant LV dysfunction, his metoprolol has been switched to carvedilol and he was also resumed on low-dose valsartan in the setting of hyperkalemia on admission.   Based on the full note, it appears he did have an episode of VT with ATP on 09/04/2016 after hospital discharge. However he was asleep at the time and did not have any symptom. Patient presents today for cardiology office visit. Then the episode of VT treated with ATP, he does not have any other issue. Surprisingly he has lost roughly 7 pounds based on his home scale. I want to obtain a basic metabolic panel to check his renal function. He is also due for TSH and liver function test given the initiation and the likely chronic usage of amiodarone. We will obtain TSH and a complete metabolic panel today instead. We will obtain pulmonary function test to establish a baseline for him. Otherwise he denies any chest pain, shortness of breath, lower extremity edema, dizziness, presyncope or syncope. He is due device clinic visit next week. He is left pectoral incision site appears to be stable, covered with  Steri-Strips, no active bleeding or redness to suggest infection. He denies any recent fever or chill. I will initiate him on Entresto. Due to hyperkalemia in the hospital, I will hold off on starting him on spironolactone.    Past Medical History:  Diagnosis Date  . Acid reflux   . AICD (automatic cardioverter/defibrillator) present    a. 08/2016 St. Jude (serial Number  [id number]) biventricular ICD.  [name] Chronic systolic CHF (congestive heart failure) (HCC)    a. 08/2016 Echo: EF 20%, diffuse HK, inf, post AK, mod-sev MR, sev dil LA, mod TR, PASP .  [name] Complication of anesthesia    "was told I responded well to anesthesia"  . Coronary artery disease involving native coronary artery of native heart with angina pectoris (HCC)    a. 10/2012 MI after aortobifem bypass, found to have multivessel CAD -->CABG x3;  b. 08/2016 Cath: LM 60, LAD 67m, LCX 60ost/p, RCA 100p, VG->RPDA 50p, VG->OM1 ok, LIMA->LAD ok, EF 25%.  . Essential hypertension 03/03/2012  . Family history of adverse reaction to anesthesia    "was told I responded well to anesthesia"  . History of blood transfusion 11/2012   "during his CABG"  . History of gout X 1  . History of stomach ulcers 1970s  . Hypercholesteremia   . Hyperlipidemia associated with type 2 diabetes mellitus (HCC) 09/16/2011  . Ischemic cardiomyopathy    a. 08/2016 Echo: EF 20%, diffuse HK, inf, post AK;  b. 08/2016 St. Jude (serial Number [id number]) biventricular ICD.  [name] Myocardial infarction    "was told he had evidence of a possible MI before his CABG"  . PAD (peripheral artery disease) (HCC)    s/p aortobifemoral bypass 10/2012  . PTSD (post-traumatic stress disorder)   . Type II diabetes mellitus (HCC)    "takes pills" (09/03/2016)  . Ventricular tachycardia (HCC)    a. 08/2016 St. Jude (serial Number [id number]) biventricular ICD-->oral amio added.  . 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 Is now on chronic suppressive antibiotics with dicloxacillin    Past Surgical History:  Procedure Laterality Date  . AORTO-FEMORAL BYPASS GRAFT Bilateral 10/2012   [facility]  . BI-VENTRICULAR IMPLANTABLE CARDIOVERTER DEFIBRILLATOR  (CRT-D)  09/03/2016  . CARDIAC CATHETERIZATION  11/2012   [facility] (? for abnormal Nuclear ST) --> no PCI, by report, patent grafts.  [name] CARDIAC CATHETERIZATION N/A 09/02/2016   Procedure: Left Heart Cath and Cors/Grafts Angiography;  Surgeon: Kathleene Hazel, MD;  Location: [facility] INVASIVE CV LAB;  Service: Cardiovascular;  Laterality: N/A;  . CARDIAC CATHETERIZATION  10/2012; 03/2013  . CHOLECYSTECTOMY  03/05/2012   Procedure: LAPAROSCOPIC CHOLECYSTECTOMY WITH INTRAOPERATIVE CHOLANGIOGRAM;  Surgeon: Emelia Loron, MD;  Location: [facility] OR;  Service: General;  Laterality: N/A;  . CORONARY ARTERY BYPASS GRAFT  11/2012   CABG X 3;  [facility]  . EP IMPLANTABLE DEVICE N/A 09/03/2016   Procedure: BiV ICD Insertion CRT-D;  Surgeon: Marinus Maw, MD;  Location: [facility] INVASIVE CV LAB;  Service: Cardiovascular;  Laterality: N/A;  . INGUINAL HERNIA REPAIR Left 1990s  . STERNOTOMY  2015   re-do sternotomy with metal place "sheild" placed. -Has chronic staph infection, on chronic suppressive medication  . UMBILICAL HERNIA REPAIR  03/05/2012   Procedure: HERNIA REPAIR UMBILICAL ADULT;  Surgeon: Emelia Loron, MD;  Location: [facility] OR;  Service: General;  Laterality: N/A;    Current Medications:  Outpatient Medications Prior to Visit  Medication Sig Dispense Refill  . albuterol (PROVENTIL HFA) 108 (90 Base) MCG/ACT inhaler Inhale 1-2 puffs into the lungs every 6 (six) hours as needed for wheezing or shortness of breath.    [name] amiodarone (PACERONE) 200 MG tablet 2 tabs twice daily x 1 week, then 1 tab twice daily x 2 wks, then 1 tab daily. 65 tablet 3  . aspirin EC 81 MG tablet Take 81 mg by mouth daily.      [name] atorvastatin (LIPITOR) 80 MG tablet Take 40 mg by mouth at bedtime.    . carvedilol (COREG) 6.25 MG tablet Take 1 tablet (6.25 mg total) by mouth 2 (two) times daily with a meal. 60 tablet 6  . Cholecalciferol (VITAMIN D-3 PO) Take 2,000 mg by mouth daily.     . dicloxacillin (DYNAPEN) 250 MG capsule Take 500 mg by mouth 3 (three) times daily.    . fluticasone  (FLONASE ALLERGY RELIEF) 50 MCG/ACT nasal spray Place 2 sprays into both nostrils daily as needed for allergies or rhinitis.    . furosemide (LASIX) 40 MG tablet Take 1 tablet (40 mg total) by mouth daily. 30 tablet 6  . glipiZIDE (GLUCOTROL) 5 MG tablet Take 2.5 mg by mouth daily before breakfast.    . magnesium oxide (MAG-OX) 400 (241.3 Mg) MG tablet Take 1 tablet (400 mg total) by mouth daily. 30 tablet 6  . metFORMIN (GLUCOPHAGE) 1000 MG tablet Take 1 tablet (1,000 mg total) by mouth 2 (two) times daily with a meal. Resume on 09/06/2016    . nitroGLYCERIN (NITROSTAT) 0.4 MG SL tablet Place 1 tablet (0.4 mg total) under the tongue every 5 (five) minutes x 3 doses as needed for chest pain. 25 tablet 3  . omeprazole (PRILOSEC) 20 MG capsule Take 20 mg by mouth daily.    . ranitidine (ZANTAC) 150 MG tablet Take 150 mg by mouth daily as needed for heartburn.     . valsartan (DIOVAN) 40 MG tablet Take 1 tablet (40 mg total) by mouth daily. 30 tablet 6   No facility-administered medications prior to visit.      Allergies:   Patient has no known allergies.   Social History   Social History  . Marital status: Married    Spouse name: N/A  . Number of children: N/A  . Years of education: N/A   Social History Main Topics  . Smoking status: Current Every Day Smoker    Types: Cigars, Cigarettes  . Smokeless tobacco: Former Neurosurgeon    Types: Snuff, Chew     Comment: 09/03/2016 "used smokeless tobacco in my teens; quit smoking cigarettes in the early 1980s; smokes pipe only"  . Alcohol use No  . Drug use: No  . Sexual activity: Not Asked   Other Topics Concern  . None   Social History Narrative  . None     Family History:  The patient's family history includes Cancer in his brother.   ROS:   Please see the history of present illness.    ROS All other systems reviewed and are negative.   PHYSICAL EXAM:   VS:  BP 129/79   Pulse 65   Ht 5\' 6"  (1.676 m)   Wt 161 lb 12.8 oz (73.4 kg)    BMI 26.12 kg/m    GEN: Well nourished, well developed, in no acute distress  HEENT: normal  Neck: no JVD, carotid bruits, or masses Cardiac: RRR; no murmurs, rubs, or gallops,no  edema.  Incision site over her left pectoral area clean, dry, without any bleeding or hematoma. There is no redness around it. The area is still covered by Steri-Strips. Respiratory:  clear to auscultation bilaterally, normal work of breathing GI: soft, nontender, nondistended, + BS MS: no deformity or atrophy  Skin: warm and dry, no rash Neuro:  Alert and Oriented x 3, Strength and sensation are intact Psych: euthymic mood, full affect  Wt Readings from Last 3 Encounters:  09/08/16 161 lb 12.8 oz (73.4 kg)  09/04/16 145 lb 4.5 oz (65.9 kg)  03/22/12 173 lb 6.4 oz (78.7 kg)      Studies/Labs Reviewed:   EKG:  EKG is not ordered today.    Recent Labs: 09/01/2016: B Natriuretic Peptide 1,502.7 09/03/2016: Magnesium 1.7 09/04/2016: Hemoglobin 12.4; Platelets 192 09/08/2016: ALT 30; BUN 11; Creat 1.19; Potassium 5.0; Sodium 135; TSH 6.87   Lipid Panel    Component Value Date/Time   CHOL 87 09/02/2016 0308   TRIG 92 09/02/2016 0308   HDL 26 (L) 09/02/2016 0308   CHOLHDL 3.3 09/02/2016 0308   VLDL 18 09/02/2016 0308   LDLCALC 43 09/02/2016 0308    Additional studies/ records that were reviewed today include:   Cath 09/02/2016 Conclusion     There is severe left ventricular systolic dysfunction.  LV end diastolic pressure is severely elevated.  The left ventricular ejection fraction is less than 25% by visual estimate.  There is no mitral valve regurgitation.  Prox RCA lesion, 100 %stenosed.  SVG graft was visualized by angiography.  Origin to Prox Graft lesion, 50 %stenosed.  Ost Cx to Prox Cx lesion, 60 %stenosed.  Ost LM to LM lesion, 60 %stenosed.  SVG graft was visualized by angiography.  LIMA graft was visualized by angiography.  Mid LAD lesion, 99 %stenosed.   1.  Severe triple vessel CAD s/p 3V CABG 2. Moderate distal left main stenosis 3. Subtotal occlusion of the mid LAD. Patent LIMA graft to mid LAD 4. Patent Circumflex system. Patent vein graft to the high obtuse marginal. This fills the entire lateral wall Circumflex system and some of the LAD. Of note, there is also antegrade flow into the Circumflex from injection of the left main.  5. The RCA is occluded proximally. The vein graft to the distal RCA is patent. The vein graft is small in caliber with no flow limiting stenoses.  6. Severe LV systolic dysfunction, LVEF=10-15%.  7. Wide complex tachycardia. This began after access in the left radial artery. As discussed in the narrative, we attempted DCCV x 2, bolus of amiodarone x 2, adenosine. This eventually converted to sinus with IV Lidocaine.   Recommendations: Medical management of CAD. EP to see to discuss wide complex tachycardia.      Echo 09/02/2016 LVEF is severely depressed at approximately 20% with dffuse hypokinesis; inferior and posterior akinesis   - Left ventricle: The cavity size was mildly dilated. Wall   thickness was increased in a pattern of mild LVH. Doppler   parameters are consistent with restrictive physiology, indicative   of decreased left ventricular diastolic compliance and/or   increased left atrial pressure. - Mitral valve: There was moderate to severe regurgitation. - Left atrium: The atrium was severely dilated. - Right ventricle: Systolic function was severely reduced. - Right atrium: The atrium was mildly dilated. - Atrial septum: There was increased thickness of the septum,   consistent with lipomatous hypertrophy. - Tricuspid valve: There was moderate regurgitation. - Pulmonary arteries: PA  peak pressure: 59 mm Hg (S).   EP 09/03/2016 St. Jude (serial Number [id number]) biventricular ICD St. Jude(serial number [id number]) LV lead  St. Jude (serial # [id number] ) right atrial lead and a St. Jude (serial  number [id number]) right ventricular defibrillator lead    ASSESSMENT:    1. Coronary artery disease involving coronary bypass graft of native heart without angina pectoris   2. Essential hypertension   3. Medication management   4. Ventricular tachycardia (HCC)   5. Hyperlipidemia, unspecified hyperlipidemia type   6. Controlled type 2 diabetes mellitus with complication, without long-term current use of insulin (HCC)   7. PAD (peripheral artery disease) (HCC)   8. Chronic systolic heart failure (HCC)      PLAN:  In order of problems listed above:  1. CAD s/p CABG x 3 LIMA to LAD, SVG to OM, SVG to distal RCA: Recently underwent cardiac catheterization, cath report above, all 3 grafts were patent. He has not had any further angina since he left the hospital. Apparently according to the patient, he is on lifelong dicloxacillin antibiotic due to history of sternal wound and staph infection after CABG. I will defer management of his antibiotic to his primary care physician.  2. Severe ICM with VT s/p CRT-D: Noted to have ejection fraction 15% cath and 20% on echocardiogram. He had multiple episode of bright complex tachycardia during the recent hospitalization including an episode of VT overnight and during cath as well. He underwent emergent cardioversion on the cath table twice. Since he left the hospital, he has had at least one episode of VT converted with antitachycardic pacing. He is still on the loading dose of amiodarone, going down to 200 mg twice a day this coming Saturday. He will continue that dose for 2 more weeks before going down to 200 mg daily. He has upcoming device interrogation and wound clinic check next week. He has not had any passing out spells or dizziness since he left the hospital. - Given the VT episodes, he likely will require chronic amiodarone treatment. Will obtain a pulmonary function test and TSH today as well along with CMP to assess liver function test and renal  function tests as well.  3. Chronic systolic heart failure with baseline EF 15% by cath and 20% by echo  - Underwent diuresis during recent hospitalization. Since discharge, he has lost roughly 10 pounds in the first week after he was released from the hospital, in the last week his weight has been maintained in around 144-145 pounds. He appears to be euvolemic based on today's physical exam. I will continue on the current dose of Lasix, however will obtain complete metabolic panel today. The reason for complete metabolic panel instead of a basic metabolic panel is to establish a baseline liver function test given his need for amiodarone.  4. PAD s/p aortobifemoral bypass: No significant leg pain  5. Hypertension: Blood pressure well controlled, however due to his severe ischemic cardiomyopathy, I recommended continue carvedilol and the change his losartan to Entresto. We have given him samples for now. I have also given the daughter a written prescription for her to check the pricing at local pharmacy. If the price of Sherryll Burger is too expensive, and he does not qualify for any medication assistance program, I would prefer the patient to stay on valsartan 40 mg daily instead.  6. Hyperlipidemia: Continue Lipitor 40 mg daily.  7. DM II: On glipizide and metformin at home    Medication Adjustments/Labs and  Tests Ordered: Current medicines are reviewed at length with the patient today.  Concerns regarding medicines are outlined above.  Medication changes, Labs and Tests ordered today are listed in the Patient Instructions below. Patient Instructions  Medication Instructions:  STOP VALSARTAN START ENTRESTO   If you need a refill on your cardiac medications before your next appointment, please call your pharmacy.  Labwork: TODAY AT [facility] TSH AND CMP  Testing/Procedures: Your physician has recommended that you have a pulmonary function test. Pulmonary Function Tests are a group of tests that  measure how well air moves in and out of your lungs.  Follow-Up: Your physician recommends that you schedule a follow-up appointment in: 2 MONTHS WITH DR [facility]  Any Other Special Instructions Will Be Listed Below (If Applicable). IF BLOOD PRESSURE IS STABLE MAKE AN APPT FOR BLOOD PRESSURE CHECK WITH HYPERTENSION CLINIC AFTER TAKING ENTRESTO 3-4 WEEKS        Thank you for choosing [facility]!!       Ramond Dial, [place]  09/08/2016 11:37 PM    [facility] [address] [place], [place], [place]  [address] Phone: [phone]; Fax: [phone]

## 2016-09-08 NOTE — Telephone Encounter (Signed)
Returned call to patient's daughter [name].She stated [facility] will not cover Sherryll Burger will cost $600.Stated she wanted to let Azalee Course PA know father will continue Valsartan until he sees PCP at [place] on 10/01/16.Stated she will call back if they approve Entresto.

## 2016-09-08 NOTE — Patient Instructions (Addendum)
Medication Instructions:  STOP VALSARTAN START ENTRESTO   If you need a refill on your cardiac medications before your next appointment, please call your pharmacy.  Labwork: TODAY AT [facility] TSH AND CMP  Testing/Procedures: Your physician has recommended that you have a pulmonary function test. Pulmonary Function Tests are a group of tests that measure how well air moves in and out of your lungs.  Follow-Up: Your physician recommends that you schedule a follow-up appointment in: 2 MONTHS WITH DR [facility]  Any Other Special Instructions Will Be Listed Below (If Applicable). IF BLOOD PRESSURE IS STABLE MAKE AN APPT FOR BLOOD PRESSURE CHECK WITH HYPERTENSION CLINIC AFTER TAKING ENTRESTO 3-4 WEEKS        Thank you for choosing [facility]!!

## 2016-09-08 NOTE — Telephone Encounter (Signed)
New message  Pt's daughter is calling re: Entresto 49-51mg , [facility] will not honor co-pay card because he's a [facility] pt  Pt's is asking if he can stay on the Valsartan 40mg  as he is now  Please call and advise

## 2016-09-08 NOTE — Telephone Encounter (Signed)
I have discussed with our clinical pharmacist and drug rep with [employer] who make the [facility] as well. This is a 30 day free coupon available. But it will be difficult without commercial insurance. If it cost $600, patient may be in the donut hole at this time, recheck the price in Jan to reflect the true price. If it is still too expensive, then will just do Valsartan.

## 2016-09-09 ENCOUNTER — Other Ambulatory Visit: Payer: Self-pay

## 2016-09-09 ENCOUNTER — Other Ambulatory Visit: Payer: Self-pay | Admitting: Physician Assistant

## 2016-09-09 DIAGNOSIS — Z79899 Other long term (current) drug therapy: Secondary | ICD-10-CM

## 2016-09-09 NOTE — Progress Notes (Signed)
[facility] notified-Raiann faxed written order [phone] (lab slip)

## 2016-09-11 LAB — T4, FREE: Free T4: 1.4 ng/dL (ref 0.8–1.8)

## 2016-09-16 ENCOUNTER — Other Ambulatory Visit: Payer: Self-pay | Admitting: Internal Medicine

## 2016-09-16 ENCOUNTER — Ambulatory Visit (INDEPENDENT_AMBULATORY_CARE_PROVIDER_SITE_OTHER): Payer: Medicare Other | Admitting: *Deleted

## 2016-09-16 DIAGNOSIS — I472 Ventricular tachycardia, unspecified: Secondary | ICD-10-CM

## 2016-09-16 DIAGNOSIS — I5022 Chronic systolic (congestive) heart failure: Secondary | ICD-10-CM | POA: Diagnosis not present

## 2016-09-16 LAB — CUP PACEART INCLINIC DEVICE CHECK
Date Time Interrogation Session: 20171228111012
HIGH POWER IMPEDANCE MEASURED VALUE: 65.25 Ohm
Implantable Lead Implant Date: 20171215
Implantable Lead Implant Date: 20171215
Implantable Pulse Generator Implant Date: 20171215
Lead Channel Impedance Value: 300 Ohm
Lead Channel Impedance Value: 625 Ohm
Lead Channel Pacing Threshold Amplitude: 0.75 V
Lead Channel Pacing Threshold Amplitude: 1 V
Lead Channel Pacing Threshold Amplitude: 2.5 V
Lead Channel Pacing Threshold Pulse Width: 0.5 ms
Lead Channel Pacing Threshold Pulse Width: 0.5 ms
Lead Channel Pacing Threshold Pulse Width: 0.5 ms
Lead Channel Pacing Threshold Pulse Width: 0.5 ms
Lead Channel Sensing Intrinsic Amplitude: 11.7 mV
Lead Channel Sensing Intrinsic Amplitude: 5 mV
Lead Channel Setting Pacing Amplitude: 3.5 V
Lead Channel Setting Pacing Pulse Width: 0.5 ms
Lead Channel Setting Pacing Pulse Width: 0.5 ms
Lead Channel Setting Sensing Sensitivity: 0.5 mV
MDC IDC LEAD IMPLANT DT: 20171215
MDC IDC LEAD LOCATION: 753858
MDC IDC LEAD LOCATION: 753859
MDC IDC LEAD LOCATION: 753860
MDC IDC LEAD MODEL: 7122
MDC IDC MSMT LEADCHNL RA PACING THRESHOLD AMPLITUDE: 0.75 V
MDC IDC MSMT LEADCHNL RV IMPEDANCE VALUE: 612.5 Ohm
MDC IDC MSMT LEADCHNL RV PACING THRESHOLD AMPLITUDE: 1 V
MDC IDC MSMT LEADCHNL RV PACING THRESHOLD PULSEWIDTH: 0.5 ms
MDC IDC SET LEADCHNL LV PACING AMPLITUDE: 3.5 V
MDC IDC SET LEADCHNL RV PACING AMPLITUDE: 3.5 V
MDC IDC STAT BRADY RA PERCENT PACED: 0.7 %
MDC IDC STAT BRADY RV PERCENT PACED: 98 %
Pulse Gen Serial Number: 7368976

## 2016-09-16 NOTE — Progress Notes (Signed)
Wound check appointment s/p CRTD placement 09/03/16. Steri-strips removed. Wound without redness or edema. Incision edges approximated, wound well healed. Normal device function. Thresholds, sensing, and impedances consistent with implant measurements. LV threshold increase noted- all RVC vectors tested. 1.875V @ 0.59ms P4-RVC. Vector programming unchanged. Diaphragmatic stimulation noted with distal tip programming. Device programmed at 3.5V in RA and RV until 3 month visit, LV autocapture on. Histogram distribution appropriate for patient and level of activity. 11 mode switches- all < 1 min in duration. 4 VT episodes terminated with ATP- [name] [name] was asymptomatic and is aware of driving restrictions. He has complied with amiodarone load. Patient educated about wound care, arm mobility, lifting restrictions, shock plan and remote monitoring. ROV with AS 10/27/16 and GT 12/14/16. Referred to [facility] for HF management- no Corvue reading as of yet.

## 2016-09-22 ENCOUNTER — Ambulatory Visit (HOSPITAL_COMMUNITY)
Admission: RE | Admit: 2016-09-22 | Discharge: 2016-09-22 | Disposition: A | Discharge status: Home / Self Care | Payer: Medicare Other | Source: Ambulatory Visit | Attending: Physician Assistant | Admitting: Physician Assistant

## 2016-09-22 DIAGNOSIS — Z79899 Other long term (current) drug therapy: Secondary | ICD-10-CM

## 2016-09-22 DIAGNOSIS — Z5181 Encounter for therapeutic drug level monitoring: Secondary | ICD-10-CM | POA: Diagnosis not present

## 2016-09-22 LAB — PULMONARY FUNCTION TEST
DL/VA % pred: 70 %
DL/VA: 3.04 ml/min/mmHg/L
DLCO UNC % PRED: 53 %
DLCO unc: 14.4 ml/min/mmHg
FEF 25-75 PRE: 0.61 L/s
FEF 25-75 Post: 0.7 L/sec
FEF2575-%Change-Post: 14 %
FEF2575-%PRED-PRE: 27 %
FEF2575-%Pred-Post: 32 %
FEV1-%Change-Post: 8 %
FEV1-%PRED-POST: 58 %
FEV1-%Pred-Pre: 53 %
FEV1-POST: 1.62 L
FEV1-PRE: 1.5 L
FEV1FVC-%Change-Post: 8 %
FEV1FVC-%Pred-Pre: 72 %
FEV6-%CHANGE-POST: 3 %
FEV6-%PRED-PRE: 71 %
FEV6-%Pred-Post: 73 %
FEV6-POST: 2.65 L
FEV6-Pre: 2.56 L
FEV6FVC-%Change-Post: 4 %
FEV6FVC-%Pred-Post: 101 %
FEV6FVC-%Pred-Pre: 97 %
FVC-%Change-Post: 0 %
FVC-%Pred-Post: 73 %
FVC-%Pred-Pre: 73 %
FVC-Post: 2.81 L
FVC-Pre: 2.81 L
POST FEV6/FVC RATIO: 95 %
PRE FEV1/FVC RATIO: 53 %
Post FEV1/FVC ratio: 58 %
Pre FEV6/FVC Ratio: 91 %
RV % pred: 168 %
RV: 3.73 L
TLC % PRED: 107 %
TLC: 6.68 L

## 2016-09-22 MED ORDER — ALBUTEROL SULFATE (2.5 MG/3ML) 0.083% IN NEBU
2.5000 mg | INHALATION_SOLUTION | Freq: Once | RESPIRATORY_TRACT | Status: AC
  Administered 2016-09-22: 2.5 mg via RESPIRATORY_TRACT

## 2016-09-24 ENCOUNTER — Other Ambulatory Visit: Payer: Self-pay | Admitting: *Deleted

## 2016-09-24 ENCOUNTER — Telehealth: Payer: Self-pay | Admitting: *Deleted

## 2016-09-24 DIAGNOSIS — R942 Abnormal results of pulmonary function studies: Secondary | ICD-10-CM

## 2016-09-24 NOTE — Telephone Encounter (Signed)
I am fine with that. 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

## 2016-09-24 NOTE — Telephone Encounter (Signed)
Spoke to [name], daughter of patient (listed DPR) regarding the pulmonary referral. Noted that on Monday, the patient has a f/u w his PCP at [facility] ([facility] He will need to go through them for referrals. They want to hold off on scheduling pulmonology consult until they get the approval from PCP. I've faxed clinical documents - last OV note, PFT study - to his PCP. Aware to call back if we can proceed w referral w/in [facility].

## 2016-10-15 ENCOUNTER — Telehealth: Payer: Self-pay | Admitting: *Deleted

## 2016-10-15 NOTE — Telephone Encounter (Signed)
WILL DEFER DR HARDING  LAST VISIT 09/08/17 WITH EXTENDER    Request for surgical clearance:  1. What type of surgery is being performed? Dental work  2. When is this surgery scheduled? tba  3. Are there any medications that need to be held prior to surgery and how long? ASPIRIN  4. Name of physician performing surgery?  DR CHOU  5. What is your office phone and fax number?  FAX [phone]

## 2016-10-18 NOTE — Telephone Encounter (Signed)
Fine to hold aspirin for 5-70 and restart when safe post.  Bryan Lemma, MD

## 2016-10-19 NOTE — Telephone Encounter (Signed)
Clearance faxed via Epic to [phone].

## 2016-10-25 ENCOUNTER — Encounter: Payer: Self-pay | Admitting: Nurse Practitioner

## 2016-10-25 NOTE — Progress Notes (Signed)
Electrophysiology Office Note Date: 10/27/2016  ID:  [name] [name], DOB 01/15/1946, MRN [id number]  PCP: Charolett Bumpers, PA-C Primary Cardiologist: Herbie Baltimore Electrophysiologist: Ladona Ridgel  CC: 6 week post CRT follow up  [name] [name] is a 71 y.o. male seen today for Dr Ladona Ridgel.  He presents today for post hospital electrophysiology followup. He presented to the hospital for evaluation of chest pain. He underwent catheterization which demonstrated patent grafts but required defibrillation twice for hemodynamically unstable VT.  He underwent CRTD implant prior to discharge.  Since discharge, the patient reports doing very well. He denies chest pain, palpitations, dyspnea, PND, orthopnea, nausea, vomiting, dizziness, syncope, edema, weight gain, or early satiety.  He has not had ICD shocks.   Device History: STJ CRTD implanted 2017 for ICM, CHF, VT History of appropriate therapy: No History of AAD therapy: No   Past Medical History:  Diagnosis Date  . Acid reflux   . AICD (automatic cardioverter/defibrillator) present    a. 08/2016 St. Jude (serial Number [id number]) biventricular ICD.  [name] Chronic systolic CHF (congestive heart failure) (HCC)    a. 08/2016 Echo: EF 20%, diffuse HK, inf, post AK, mod-sev MR, sev dil LA, mod TR, PASP .  [name] Complication of anesthesia    "was told I responded well to anesthesia"  . Coronary artery disease involving native coronary artery of native heart with angina pectoris (HCC)    a. 10/2012 MI after aortobifem bypass, found to have multivessel CAD -->CABG x3;  b. 08/2016 Cath: LM 60, LAD 89m, LCX 60ost/p, RCA 100p, VG->RPDA 50p, VG->OM1 ok, LIMA->LAD ok, EF 25%.  . Essential hypertension 03/03/2012  . History of blood transfusion 11/2012   "during his CABG"  . History of gout X 1  . History of stomach ulcers 1970s  . Hyperlipidemia associated with type 2 diabetes mellitus (HCC) 09/16/2011  . Ischemic cardiomyopathy    a. 08/2016 Echo: EF 20%, diffuse  HK, inf, post AK;  b. 08/2016 St. Jude (serial Number [id number]) biventricular ICD.  [name] PAD (peripheral artery disease) (HCC)    s/p aortobifemoral bypass 10/2012  . PTSD (post-traumatic stress disorder)   . Type II diabetes mellitus (HCC)   . Ventricular tachycardia (HCC)    a. 08/2016 St. Jude (serial Number [id number]) biventricular ICD-->oral amio added.  . 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 Is now on chronic suppressive antibiotics with dicloxacillin   Past Surgical History:  Procedure Laterality Date  . AORTO-FEMORAL BYPASS GRAFT Bilateral 10/2012   [facility]  . BI-VENTRICULAR IMPLANTABLE CARDIOVERTER DEFIBRILLATOR  (CRT-D)  09/03/2016  . CARDIAC CATHETERIZATION  11/2012   [facility] (? for abnormal Nuclear ST) --> no PCI, by report, patent grafts.  [name] CARDIAC CATHETERIZATION N/A 09/02/2016   Procedure: Left Heart Cath and Cors/Grafts Angiography;  Surgeon: Kathleene Hazel, MD;  Location: [facility] INVASIVE CV LAB;  Service: Cardiovascular;  Laterality: N/A;  . CARDIAC CATHETERIZATION  10/2012; 03/2013  . CHOLECYSTECTOMY  03/05/2012   Procedure: LAPAROSCOPIC CHOLECYSTECTOMY WITH INTRAOPERATIVE CHOLANGIOGRAM;  Surgeon: Emelia Loron, MD;  Location: [facility] OR;  Service: General;  Laterality: N/A;  . CORONARY ARTERY BYPASS GRAFT  11/2012   CABG X 3;  [facility]  . EP IMPLANTABLE DEVICE N/A 09/03/2016   Procedure: BiV ICD Insertion CRT-D;  Surgeon: Marinus Maw, MD;  Location: [facility] INVASIVE CV LAB;  Service: Cardiovascular;  Laterality: N/A;  . INGUINAL HERNIA  REPAIR Left 1990s  . STERNOTOMY  2015   re-do sternotomy with metal place "sheild" placed. -Has chronic staph infection, on chronic suppressive medication  . UMBILICAL HERNIA REPAIR  03/05/2012   Procedure: HERNIA REPAIR UMBILICAL ADULT;  Surgeon: Emelia Loron, MD;  Location: [facility] OR;  Service:  General;  Laterality: N/A;    Current Outpatient Prescriptions  Medication Sig Dispense Refill  . albuterol (PROVENTIL HFA) 108 (90 Base) MCG/ACT inhaler Inhale 1-2 puffs into the lungs every 6 (six) hours as needed for wheezing or shortness of breath.    [name] amiodarone (PACERONE) 200 MG tablet 2 tabs twice daily x 1 week, then 1 tab twice daily x 2 wks, then 1 tab daily. 65 tablet 3  . aspirin EC 81 MG tablet Take 81 mg by mouth daily.      [name] atorvastatin (LIPITOR) 80 MG tablet Take 40 mg by mouth at bedtime.    . carvedilol (COREG) 6.25 MG tablet Take 1 tablet (6.25 mg total) by mouth 2 (two) times daily with a meal. 60 tablet 6  . Cholecalciferol (VITAMIN D-3 PO) Take 2,000 mg by mouth daily.     . dicloxacillin (DYNAPEN) 250 MG capsule Take 500 mg by mouth 3 (three) times daily.    . fluticasone (FLONASE ALLERGY RELIEF) 50 MCG/ACT nasal spray Place 2 sprays into both nostrils daily as needed for allergies or rhinitis.    . furosemide (LASIX) 40 MG tablet Take 1 tablet (40 mg total) by mouth daily. 30 tablet 6  . glipiZIDE (GLUCOTROL) 5 MG tablet Take 2.5 mg by mouth daily before breakfast.    . magnesium oxide (MAG-OX) 400 (241.3 Mg) MG tablet Take 1 tablet (400 mg total) by mouth daily. 30 tablet 6  . metFORMIN (GLUCOPHAGE) 1000 MG tablet Take 1 tablet (1,000 mg total) by mouth 2 (two) times daily with a meal. Resume on 09/06/2016    . nitroGLYCERIN (NITROSTAT) 0.4 MG SL tablet Place 1 tablet (0.4 mg total) under the tongue every 5 (five) minutes x 3 doses as needed for chest pain. 25 tablet 3  . omeprazole (PRILOSEC) 20 MG capsule Take 20 mg by mouth daily.    . ranitidine (ZANTAC) 150 MG tablet Take 150 mg by mouth daily as needed for heartburn.     . valsartan (DIOVAN) 40 MG tablet Take 40 mg by mouth daily.     No current facility-administered medications for this visit.     Allergies:   Patient has no known allergies.   Social History: Social History   Social History  . Marital  status: Married    Spouse name: N/A  . Number of children: N/A  . Years of education: N/A   Occupational History  . Not on file.   Social History Main Topics  . Smoking status: Current Every Day Smoker    Types: Cigars, Cigarettes  . Smokeless tobacco: Former Neurosurgeon    Types: Snuff, Chew     Comment: 09/03/2016 "used smokeless tobacco in my teens; quit smoking cigarettes in the early 1980s; smokes pipe only"  . Alcohol use No  . Drug use: No  . Sexual activity: Not on file   Other Topics Concern  . Not on file   Social History Narrative  . No narrative on file    Family History: Family History  Problem Relation Age of Onset  . Cancer Brother     esophageal    Review of Systems: All other systems reviewed and are otherwise negative  except as noted above.   Physical Exam: VS:  BP 126/86   Pulse 80   Ht 5\' 6"  (1.676 m)   Wt 155 lb (70.3 kg)   SpO2 93%   BMI 25.02 kg/m  , BMI Body mass index is 25.02 kg/m.  GEN- The patient is well appearing, alert and oriented x 3 today.   HEENT: normocephalic, atraumatic; sclera clear, conjunctiva pink; hearing intact; oropharynx clear; neck supple  Lungs- Clear to ausculation bilaterally, normal work of breathing.  No wheezes, rales, rhonchi Heart- Regular rate and rhythm (paced) GI- soft, non-tender, non-distended, bowel sounds present Extremities- no clubbing, cyanosis, or edema; DP/PT/radial pulses 2+ bilaterally MS- no significant deformity or atrophy Skin- warm and dry, no rash or lesion; ICD pocket well healed Psych- euthymic mood, full affect Neuro- strength and sensation are intact  ICD interrogation- reviewed in detail today,  See PACEART report  EKG:  EKG is ordered today. The ekg ordered today shows sinus rhythm with V pacing, PVC  Recent Labs: 09/01/2016: B Natriuretic Peptide 1,502.7 09/03/2016: Magnesium 1.7 09/04/2016: Hemoglobin 12.4; Platelets 192 09/08/2016: ALT 30; BUN 11; Creat 1.19; Potassium 5.0;  Sodium 135; TSH 6.87   Wt Readings from Last 3 Encounters:  10/27/16 155 lb (70.3 kg)  09/08/16 161 lb 12.8 oz (73.4 kg)  09/04/16 145 lb 4.5 oz (65.9 kg)     Other studies Reviewed: Additional studies/ records that were reviewed today include: Dr Herbie Baltimore and Dr Lubertha Basque office notes  Assessment and Plan:  1.  Chronic systolic dysfunction euvolemic today Stable on an appropriate medical regimen Normal ICD function See [facility] [employer] report No changes today Enroll in [facility] clinic Repeat echo 02/2017 (6 months post CRT) BMET today with recent addition of Entresto  2.  Ventricular tachycardia No recurrence since discharge Continue amiodarone. LFT's, TSH recently stable. Annual eye exams recommended. PFT's recently abnormal - pulmonary referral made through [place] No driving x6 months   3.  CAD s/p CABG Patent grafts at recent catheterization No ischemic symptoms Continue medical therapy  4.  HTN Stable No change required today   Current medicines are reviewed at length with the patient today.   The patient does not have concerns regarding his medicines.  The following changes were made today:  none  Labs/ tests ordered today include: BMET, echo 02/2017 Orders Placed This Encounter  Procedures  . EKG 12-Lead     Disposition:   Follow up with ICM clinic, Dr Clifton James as scheduled, Dr Ladona Ridgel 6 weeks    Signed, Gypsy Balsam, NP 10/27/2016 9:33 AM  [facility] [address] [place] [address] [phone] (office) [phone] (fax

## 2016-10-27 ENCOUNTER — Ambulatory Visit: Payer: Medicare Other | Admitting: Nurse Practitioner

## 2016-10-27 ENCOUNTER — Ambulatory Visit (INDEPENDENT_AMBULATORY_CARE_PROVIDER_SITE_OTHER): Payer: Medicare Other | Admitting: Nurse Practitioner

## 2016-10-27 ENCOUNTER — Encounter: Payer: Self-pay | Admitting: Nurse Practitioner

## 2016-10-27 VITALS — BP 126/86 | HR 80 | Ht 66.0 in | Wt 155.0 lb

## 2016-10-27 DIAGNOSIS — I472 Ventricular tachycardia, unspecified: Secondary | ICD-10-CM

## 2016-10-27 DIAGNOSIS — I420 Dilated cardiomyopathy: Secondary | ICD-10-CM

## 2016-10-27 DIAGNOSIS — I10 Essential (primary) hypertension: Secondary | ICD-10-CM | POA: Diagnosis not present

## 2016-10-27 NOTE — Patient Instructions (Addendum)
Medication Instructions:  Your physician recommends that you continue on your current medications as directed. Please refer to the Current Medication list given to you today.   Labwork: Bmet today   Testing/Procedures: Your physician has requested that you have an echocardiogram. Echocardiography is a painless test that uses sound waves to create images of your heart. It provides your doctor with information about the size and shape of your heart and how well your heart's chambers and valves are working. This procedure takes approximately one hour. There are no restrictions for this procedure. (To be scheduled in June 2018)  Follow-Up: Follow up as planned with Dr.Taylor in March 2018  Any Other Special Instructions Will Be Listed Below (If Applicable).     If you need a refill on your cardiac medications before your next appointment, please call your pharmacy.

## 2016-10-28 LAB — CUP PACEART INCLINIC DEVICE CHECK
Implantable Lead Implant Date: 20171215
Implantable Lead Implant Date: 20171215
Implantable Lead Implant Date: 20171215
Implantable Lead Location: 753858
Implantable Lead Location: 753860
Implantable Lead Model: 7122
MDC IDC LEAD LOCATION: 753859
MDC IDC PG IMPLANT DT: 20171215
MDC IDC SESS DTM: 20180208081958
Pulse Gen Serial Number: 7368976

## 2016-10-28 LAB — BASIC METABOLIC PANEL
BUN / CREAT RATIO: 12 (ref 10–24)
BUN: 13 mg/dL (ref 8–27)
CO2: 26 mmol/L (ref 18–29)
CREATININE: 1.08 mg/dL (ref 0.76–1.27)
Calcium: 9.2 mg/dL (ref 8.6–10.2)
Chloride: 94 mmol/L — ABNORMAL LOW (ref 96–106)
GFR calc Af Amer: 81 mL/min/{1.73_m2} (ref >59)
GFR calc non Af Amer: 70 mL/min/{1.73_m2} (ref >59)
GLUCOSE: 148 mg/dL — AB (ref 65–99)
Potassium: 4.9 mmol/L (ref 3.5–5.2)
Sodium: 133 mmol/L — ABNORMAL LOW (ref 134–144)

## 2016-11-01 ENCOUNTER — Telehealth: Payer: Self-pay | Admitting: *Deleted

## 2016-11-01 NOTE — Telephone Encounter (Signed)
Returned a call to wife of patient. Needed update on fax clearance for patient's dental work (see 10/15/16 telephone note). Wife states this was not received by [place]. I reconfirmed fax number provided was correctly transcribed, re-sent documentation advising patient OK for procedure. Relayed ASA stop/restart instructions to caller. She voiced understanding and thanks. NFQs at this time.

## 2016-11-09 ENCOUNTER — Ambulatory Visit (INDEPENDENT_AMBULATORY_CARE_PROVIDER_SITE_OTHER): Payer: Medicare Other | Admitting: Cardiology

## 2016-11-09 ENCOUNTER — Ambulatory Visit (INDEPENDENT_AMBULATORY_CARE_PROVIDER_SITE_OTHER): Payer: Medicare Other

## 2016-11-09 ENCOUNTER — Encounter: Payer: Self-pay | Admitting: Cardiology

## 2016-11-09 DIAGNOSIS — I25119 Atherosclerotic heart disease of native coronary artery with unspecified angina pectoris: Secondary | ICD-10-CM

## 2016-11-09 DIAGNOSIS — I420 Dilated cardiomyopathy: Secondary | ICD-10-CM | POA: Diagnosis not present

## 2016-11-09 DIAGNOSIS — E1169 Type 2 diabetes mellitus with other specified complication: Secondary | ICD-10-CM | POA: Diagnosis not present

## 2016-11-09 DIAGNOSIS — I5022 Chronic systolic (congestive) heart failure: Secondary | ICD-10-CM

## 2016-11-09 DIAGNOSIS — I5042 Chronic combined systolic (congestive) and diastolic (congestive) heart failure: Secondary | ICD-10-CM

## 2016-11-09 DIAGNOSIS — E785 Hyperlipidemia, unspecified: Secondary | ICD-10-CM

## 2016-11-09 DIAGNOSIS — Z95810 Presence of automatic (implantable) cardiac defibrillator: Secondary | ICD-10-CM

## 2016-11-09 DIAGNOSIS — J449 Chronic obstructive pulmonary disease, unspecified: Secondary | ICD-10-CM | POA: Diagnosis not present

## 2016-11-09 DIAGNOSIS — I209 Angina pectoris, unspecified: Secondary | ICD-10-CM | POA: Diagnosis not present

## 2016-11-09 MED ORDER — VALSARTAN 80 MG PO TABS: 80.0000 mg | ORAL_TABLET | Freq: Every day | ORAL | 11 refills | Status: DC

## 2016-11-09 NOTE — Progress Notes (Signed)
EPIC Encounter for ICM Monitoring  Patient Name: [name] [name] is a 71 y.o. male Date: 11/09/2016 Primary Care Physican: Charolett Bumpers, PA-C Primary Cardiologist: Herbie Baltimore  Electrophysiologist: Ladona Ridgel Dry Weight: 151.2 lbs  Bi-V Pacing:  96%       1st ICM encounter.  Heart Failure questions reviewed, pt asymptomatic. Reviewed HF symptoms.   Thoracic impedance at normal today but was abnormal suggesting fluid accumulation from 10/29/2016 to 11/08/2016.  Current prescribed dose of Furosemide 40 mg 1 tablet daily.   Recommendations: Since at baseline today, no changes. Discussed low salt diet and fluid intake.  He said he is very vigilant about checking food labels for salt amount and never has over 2000 mg daily.  He does not eat out at restaurants very much. He will recheck food labels in the event he has missed some foods that may be high in salt.  He always limits his fluid intake to 64 oz day.  Provided ICM number and encouraged to call for fluid symptoms.  Follow-up plan: ICM clinic phone appointment on 11/23/2016 to recheck fluid levels.   Office appointment with Dr Herbie Baltimore today, 2/20 and will send copy of note for review.    Copy of ICM check sent to primary cardiologist and device physician.   3 month ICM trend: 11/09/2016   1 Year ICM trend:      Karie Soda, RN 11/09/2016 7:54 AM

## 2016-11-09 NOTE — Progress Notes (Signed)
PCP: Merry Proud, PA-C  Clinic Note: Chief Complaint  Patient presents with  . Follow-up    no complaints   . Coronary Artery Disease  . Cardiomyopathy    HPI: [name] [name] is a 71 y.o. male with a PMH below who presents today for General/Interventional cardiology management of his CAD -Severe ICM. He has a history of sustained VT status post ICD placement back in December 2017. This is when I first met him. He has known CAD- (s/p 3 V CABG)  that was diagnosed after his AoBiFem Bypass in Feb 2014 - had Ischemic VT post-operatively, but never had any Sx of Angina --> Cath (this was @ [facility] --> relook Cath in July 2014.   Post-op CABG was complicated by Sternal Wound Infxn --> redo-sternotomy with Steel-Plate insertion (on lifelong Dicloxacillin). After his CABG, he was lost to Cardiology F/u (by his report, the [facility] never told him to follow-up with Cardiology). His las .   Recent Hospitalizations: He presented to [facility] on 08/22/2016 with Sx concerning for unstable angina / ACS.  He also had several Salvo's of Wide Complex Tachycardia that was thought to be SVT with aberrancy vs. Sustained VT.  Echo showed severe ICM with EF ~20%  --> this occurred during his Cardiac Catheterization & Lidocaine was added. EP was consulted & he had Klondike implant in [id number], 2017 (Dr. Lovena Le) as the [facility] was felt to be more c/w VT.  Now on PO Amiodarone for recurrent bouts of VT on device interrogation (treated with ATP - asymptomatic).  Cath did not reveal new culprit lesion. He was also noted to be in A on C Combined HF requiring diuresis of ~4 L.  Converted to Carvedilol & Valsartan (with hopes to convert to [facility]).  He saw Almyra Deforest, PA in TCM f/u on Dec 20 --> He lost ~7 lb in the first week or so post-hospital. Plan was to convert to [facility] - however, the [place] would not cover this change & he remains on Valsartan. -- No Spironolactone 2/2 hyperKalemia.  Studies  Reviewed: (updated in PSH/PMH)  2 D Echo 12/14: mild LVH. ~ Restrictive physiology (Gr 3 DD). Mod-Severe MR. Severe reduced/dilated RV & LV Fxn. LVEF ~20%.with dffuse hypokinesis; inferior and posterior akinesis   Cardiac Cath 12/14:    1. Severe triple vessel CAD s/p 3V CABG 2. Moderate distal left main stenosis 3. Subtotal occlusion of the mid LAD. Patent LIMA graft to mid LAD 4. Patent Circumflex system. Patent vein graft to the high obtuse marginal. This fills the entire lateral wall Circumflex system and some of the LAD. Of note, there is also antegrade flow into the Circumflex from injection of the left main.  5. The RCA is occluded proximally. The vein graft to the distal RCA is patent. The vein graft is small in caliber with no flow limiting stenoses.  6. Severe LV systolic dysfunction, TOIZ=12-45%.  7. Wide complex tachycardia. This began after access in the left radial artery. As discussed in the narrative, we attempted DCCV x 2, bolus of amiodarone x 2, adenosine. This eventually converted to sinus with IV Lidocaine.   Recommendations: Medical management of CAD. EP to see to discuss wide complex tachycardia.       St. Jude ICD 12/15 - Dr. Lovena Le (CRTD)  [name] [name] was last seen on Feb 7,2018 by Chanetta Marshall, NP (from Electrophysiology).  Thought to be euvolemic. No recurrence of VT on PO Amiodarone (PFTs  followed by [facility]).     Interval History: [name] [name] presents today feeling well.  He is walking anywhere from 1-2 miles daily (although he usually stops to rest after ~15 min).  He has not had any SSx of recurrent angina with rest or exertion.  No PND, orthopnea or edema - but was told that his impedence levels were abnormal on Optivol - but they improved after the next week. He has not noted any sensation of rapid-irregular HR.  No lightheadedness, dizziness, weakness or syncope/near syncope. No TIA/amaurosis fugax symptoms. No claudication.  ROS: A comprehensive was  performed. Review of Systems  Constitutional: Negative for malaise/fatigue.  HENT: Negative for nosebleeds.   Eyes: Negative.   Respiratory: Negative for cough, shortness of breath and wheezing.   Cardiovascular:       Per history of present illness  Gastrointestinal: Negative for blood in stool, constipation and melena.  Genitourinary: Negative for hematuria.  Musculoskeletal: Positive for joint pain (Knees).  Skin: Negative.   Neurological: Positive for dizziness (Occasionally with position).  Endo/Heme/Allergies: Does not bruise/bleed easily.  Psychiatric/Behavioral: Negative for memory loss. The patient is not nervous/anxious and does not have insomnia.   All other systems reviewed and are negative.   Past Medical History:  Diagnosis Date  . Acid reflux   . AICD (automatic cardioverter/defibrillator) present    a. 08/2016 St. Jude (serial Number [id number]) biventricular ICD.  [name] Chronic systolic CHF (congestive heart failure) ([place])    a. 08/2016 Echo: EF 20%, diffuse HK, inf, post AK, mod-sev MR, sev dil LA, mod TR, PASP 30mHg.  .Marland KitchenComplication of anesthesia    "was told I responded well to anesthesia"  . Coronary artery disease involving native coronary artery of native heart with angina pectoris ([place]    a. 10/2012 MI after aortobifem bypass, found to have multivessel CAD -->CABG x3;  b. 08/2016 Cath: LM 60, LAD 913mLCX 60ost/p, RCA 100p, VG->RPDA 50p, VG->OM1 ok, LIMA->LAD ok, EF 25%.  . Essential hypertension 03/03/2012  . History of blood transfusion 11/2012   "during his CABG"  . History of gout X 1  . History of stomach ulcers 1970s  . Hyperlipidemia associated with type 2 diabetes mellitus (HCF[place]12/27/2012  . Ischemic cardiomyopathy    a. 08/2016 Echo: EF 20%, diffuse HK, inf, post AK;  b. 08/2016 St. Jude (serial Number [id number] ICD.  . Marland KitchenAD (peripheral artery disease) (HC[place]   s/p aortobifemoral bypass 10/2012  . PTSD (post-traumatic stress disorder)   .  Type II diabetes mellitus ([place]  . Ventricular tachycardia ([place]   a. 08/2016 St. Jude (serial Number [id number] ICD-->oral amio added.  . 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 Is now on chronic suppressive antibiotics with dicloxacillin    Past Surgical History:  Procedure Laterality Date  . AORTO-FEMORAL BYPASS GRAFT [facility] BI-VENTRICULAR IMPLANTABLE CARDIOVERTER DEFIBRILLATOR  (CRT-D)  09/03/2016  . CARDIAC [facility]HE[facility]  [facility]   [facility] ST) --> no PCI, by report, patent grafts.  . Marland KitchenARDIAC CATHETERIZATION N/A 09/02/2016   Procedure: Left Heart Cath and Cors/Grafts Angiography;  Surgeon: ChBurnell BlanksMD;  [facility];  Service: Cardiovascular;  Laterality: N/A;  . CARDIAC CATHETERIZATION  10/2012; 03/2013  . CHOLECYSTECTOMY  03/05/2012   Procedure: LAPAROSCOPIC CHOLECYSTECTOMY WITH INTRAOPERATIVE CHOLANGIOGRAM;  Surgeon: MaRodman Key  Donne Hazel, MD;  Location: [place];  Service: General;  Laterality: N/A;  . CORONARY ARTERY BYPASS GRAFT  11/2012   CABG X 3;  [facility]  . EP IMPLANTABLE DEVICE N/A 09/03/2016   Procedure: BiV ICD Insertion CRT-D;  Surgeon: Evans Lance, MD;  Location: [place] CV LAB;  Service: Cardiovascular;  Laterality: N/A;  . INGUINAL HERNIA REPAIR Left 1990s  . STERNOTOMY  2015   re-do sternotomy with metal place "sheild" placed. -Has chronic staph infection, on chronic suppressive medication  . UMBILICAL HERNIA REPAIR  03/05/2012   Procedure: HERNIA REPAIR UMBILICAL ADULT;  Surgeon: Rolm Bookbinder, MD;  Location: [place];  Service: General;  Laterality: N/A;    Current Meds  Medication Sig  . albuterol (PROVENTIL HFA) 108 (90 Base) MCG/ACT inhaler Inhale 1-2 puffs into the lungs every 6 (six) hours as needed for wheezing or shortness of  breath.  [name] amiodarone (PACERONE) 200 MG tablet 2 tabs twice daily x 1 week, then 1 tab twice daily x 2 wks, then 1 tab daily.  [name] aspirin EC 81 MG tablet Take 81 mg by mouth daily.    [name] atorvastatin (LIPITOR) 80 MG tablet Take 40 mg by mouth at bedtime.  . carvedilol (COREG) 6.25 MG tablet Take 1 tablet (6.25 mg total) by mouth 2 (two) times daily with a meal.  . Cholecalciferol (VITAMIN D-3 PO) Take 2,000 mg by mouth daily.   . dicloxacillin (DYNAPEN) 250 MG capsule Take 500 mg by mouth 3 (three) times daily.  . fluticasone (FLONASE ALLERGY RELIEF) 50 MCG/ACT nasal spray Place 2 sprays into both nostrils daily as needed for allergies or rhinitis.  . furosemide (LASIX) 40 MG tablet Take 1 tablet (40 mg total) by mouth daily.  [name] glipiZIDE (GLUCOTROL) 5 MG tablet Take 2.5 mg by mouth daily before breakfast.  . magnesium oxide (MAG-OX) 400 (241.3 Mg) MG tablet Take 1 tablet (400 mg total) by mouth daily.  . metFORMIN (GLUCOPHAGE) 1000 MG tablet Take 1 tablet (1,000 mg total) by mouth 2 (two) times daily with a meal. Resume on 09/06/2016  . nitroGLYCERIN (NITROSTAT) 0.4 MG SL tablet Place 1 tablet (0.4 mg total) under the tongue every 5 (five) minutes x 3 doses as needed for chest pain.  [name] omeprazole (PRILOSEC) 20 MG capsule Take 20 mg by mouth daily.  . ranitidine (ZANTAC) 150 MG tablet Take 150 mg by mouth daily as needed for heartburn.   . Tiotropium Bromide Monohydrate (SPIRIVA HANDIHALER IN) Inhale 18 mcg into the lungs daily.  . [DISCONTINUED] valsartan (DIOVAN) 40 MG tablet Take 40 mg by mouth daily.    No Known Allergies  Social History   Social History  . Marital status: Married    Spouse name: N/A  . Number of children: N/A  . Years of education: N/A   Social History Main Topics  . Smoking status: Current Every Day Smoker    Types: Cigars, Cigarettes  . Smokeless tobacco: Former Systems developer    Types: Snuff, Chew     Comment: 09/03/2016 "used smokeless tobacco in my teens; quit smoking  cigarettes in the early 1980s; smokes pipe only"  . Alcohol use No  . Drug use: No  . Sexual activity: Not Asked   Other Topics Concern  . None   Social History Narrative  . None    family history includes Cancer in his brother.  Wt Readings from Last 3 Encounters:  11/09/16 69.4 kg (153 lb)  10/27/16 70.3 kg (155 lb)  09/08/16 73.4 kg (161 lb 12.8 oz)    PHYSICAL EXAM BP 120/78   Pulse 76   Ht '5\' 6"'  (1.676 m)   Wt 69.4 kg (153 lb)   BMI 24.69 kg/m  General appearance: alert, cooperative, appears stated age, no distress and Otherwise healthy-appearing. Well-nourished and well-groomed. Neck: no adenopathy, no carotid bruit and no JVD Lungs: clear to auscultation bilaterally, normal percussion bilaterally and non-labored Heart: RRR with normal S1 and S2. No M/R/G. Nondisplaced PMI. Abdomen: soft, non-tender; bowel sounds normal; no masses,  Extremities: extremities normal, atraumatic, no cyanosis, or edema Pulses: 2+ and symmetric;  Skin: mobility and turgor normal, no evidence of bleeding or bruising and no lesions noted  Neurologic: Mental status: Alert, oriented, thought content appropriate. 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 Increase valsartan 40 mg 80 mg. Potassium level still 5.1.  Reluctant to add spironolactone especially with increasing dose of ARB. On standing Lasix with recommendations to take an increased dose once twice a week.  Thoracic impedance being followed with his AICD.      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 Completely revascularized by patent grafts. Status post AICD for V. fib. Also now on amiodarone. He is on carvedilol and valsartan and having been unable to get Entresto. On a standing dose of Lasix. We talked about sliding scale. With his impedance being abnormal intermittently, I think I would like him to may be taken additional dose of Lasix once or twice a week standing.  Plan: Increase valsartan to 80 mg daily - Recheck 2-D echo in early summer in follow-up after. -- Would like to see some improvement in EF with medical management.      Relevant Medications   valsartan (DIOVAN) 80 MG tablet   COPD without exacerbation (HCC) (Chronic)    He was started on Spiriva by his primary physician, but he wondered clarify with me if this is okay and safe to take from cardiac standpoint. I reassured him that it was a very good option.      Relevant Medications   Tiotropium Bromide Monohydrate (SPIRIVA HANDIHALER IN)   Coronary artery disease involving native coronary artery of native heart with angina pectoris (HCC) (Chronic)    Thankfully, not having any angina. Widely patent grafts on cath. He is on aspirin, statin, beta blocker and ARB.      Relevant Medications   valsartan (DIOVAN) 80 MG tablet   Hyperlipidemia associated with type 2 diabetes mellitus (HCC) (Chronic)    Lipids look well-controlled currently  on 40 mg Lipitor. Continue current management.      Relevant Medications   valsartan (DIOVAN) 80 MG tablet      Current medicines are reviewed at length  with the patient today. (+/- concerns) Never did start Entresto because of lack of coverage by the [place]. The following changes have been made: n/a  Patient Instructions  Medication change  Increase Valsartan to 80 mg one tablet daily   Your physician recommends that you schedule a follow-up appointment in  June or July 2018  After echo is completed on June 11,2018  With Dr Ellyn Hack.   If you need a refill on your cardiac medications before your next appointment, please call your pharmacy.    Studies Ordered:   No orders of the defined types were placed in this encounter.     Glenetta Hew, M.D., M.S. Interventional Cardiologist   Pager # [phone] Phone # [phone] [address] [place], [place] [address]

## 2016-11-09 NOTE — Patient Instructions (Signed)
Medication change  Increase Valsartan to 80 mg one tablet daily   Your physician recommends that you schedule a follow-up appointment in  June or July 2018  After echo is completed on June 11,2018  With Dr Herbie Baltimore.   If you need a refill on your cardiac medications before your next appointment, please call your pharmacy.

## 2016-11-11 ENCOUNTER — Encounter: Payer: Self-pay | Admitting: Cardiology

## 2016-11-11 DIAGNOSIS — J449 Chronic obstructive pulmonary disease, unspecified: Secondary | ICD-10-CM | POA: Insufficient documentation

## 2016-11-11 NOTE — Assessment & Plan Note (Addendum)
Severely reduced EF, but was not on any medications. Completely revascularized by patent grafts. Status post AICD for V. fib. Also now on amiodarone. He is on carvedilol and valsartan and having been unable to get Entresto. On a standing dose of Lasix. We talked about sliding scale. With his impedance being abnormal intermittently, I think I would like him to may be taken additional dose of Lasix once or twice a week standing.  Plan: Increase valsartan to 80 mg daily - Recheck 2-D echo in early summer in follow-up after. -- Would like to see some improvement in EF with medical management.

## 2016-11-11 NOTE — Assessment & Plan Note (Signed)
Thankfully, not having any angina. Widely patent grafts on cath. He is on aspirin, statin, beta blocker and ARB.

## 2016-11-11 NOTE — Assessment & Plan Note (Signed)
Currently on stable dose of beta blocker, but could be titrated up next visit. Increase valsartan 40 mg 80 mg. Potassium level still 5.1. Reluctant to add spironolactone especially with increasing dose of ARB. On standing Lasix with recommendations to take an increased dose once twice a week.  Thoracic impedance being followed with his AICD.

## 2016-11-11 NOTE — Assessment & Plan Note (Signed)
Lipids look well-controlled currently on 40 mg Lipitor. Continue current management.

## 2016-11-11 NOTE — Assessment & Plan Note (Signed)
He was started on Spiriva by his primary physician, but he wondered clarify with me if this is okay and safe to take from cardiac standpoint. I reassured him that it was a very good option.

## 2016-11-23 ENCOUNTER — Ambulatory Visit (INDEPENDENT_AMBULATORY_CARE_PROVIDER_SITE_OTHER): Payer: Medicare Other

## 2016-11-23 DIAGNOSIS — Z95810 Presence of automatic (implantable) cardiac defibrillator: Secondary | ICD-10-CM

## 2016-11-23 DIAGNOSIS — I5022 Chronic systolic (congestive) heart failure: Secondary | ICD-10-CM

## 2016-11-23 NOTE — Progress Notes (Signed)
EPIC Encounter for ICM Monitoring  Patient Name: [name] [name] is a 71 y.o. male Date: 11/23/2016 Primary Care Physican: Charolett Bumpers, PA-C Primary Cardiologist: Herbie Baltimore  Electrophysiologist: Ladona Ridgel Dry Weight:   151 lbs  Bi-V Pacing:  96%                                                       Heart Failure questions reviewed, pt asymptomatic.    Thoracic impedance normal  Current prescribed dose of Furosemide 40 mg 1 tablet daily.  Per Dr Herbie Baltimore note 11/12/2016, discussed Furosemide sliding scale and to take an additional dose of Lasix once or twice a week standing.  Labs: 10/27/2016 Creatinine 1.08, BUN 13, Potassium 4.9, Sodium 133 09/08/2016 Creatinine 1.19, BUN 11, Potassium 5.0, Sodium 135 09/03/2016 Creatinine 1.36, BUN 17, Potassium 3.9, Sodium 128 09/02/2016 Creatinine 1.13, BUN 14, Potassium 3.9, Sodium 134 09/01/2017 Creatinine 1.30, BUN 15, Potassium 5.3, Sodium 133  Recommendations: No changes. Reminded to limit dietary salt intake to 2000 mg/day and fluid intake to < 2 liters/day. Encouraged to call for fluid symptoms.  Follow-up plan: ICM clinic phone appointment on 01/14/2017 and defib office check with Dr Ladona Ridgel 12/14/2016.  Copy of ICM check sent to device physician.   3 month ICM trend: 11/23/2016   1 Year ICM trend:      Karie Soda, RN 11/23/2016 8:41 AM

## 2016-12-02 ENCOUNTER — Telehealth: Payer: Self-pay | Admitting: Cardiology

## 2016-12-02 NOTE — Telephone Encounter (Signed)
Office is currently closed 

## 2016-12-02 NOTE — Telephone Encounter (Signed)
New Message    Gayle from [facility] called [phone] regarding pt's general dental work. Needs additional information on pre-med requirements for the pt. Requests a call back.

## 2016-12-06 NOTE — Telephone Encounter (Signed)
REVIEWED PT'S  CHART  AND APPEARS  NO  PRE MED  IS REQUIRED   DR  CHOU'S OFFICE  NOTIFIED .Zack Seal

## 2016-12-14 ENCOUNTER — Encounter: Payer: Self-pay | Admitting: Internal Medicine

## 2016-12-14 ENCOUNTER — Ambulatory Visit (INDEPENDENT_AMBULATORY_CARE_PROVIDER_SITE_OTHER): Payer: Medicare Other | Admitting: Internal Medicine

## 2016-12-14 VITALS — BP 124/78 | HR 74 | Ht 66.0 in | Wt 158.0 lb

## 2016-12-14 DIAGNOSIS — I25119 Atherosclerotic heart disease of native coronary artery with unspecified angina pectoris: Secondary | ICD-10-CM

## 2016-12-14 DIAGNOSIS — I5022 Chronic systolic (congestive) heart failure: Secondary | ICD-10-CM | POA: Diagnosis not present

## 2016-12-14 DIAGNOSIS — I472 Ventricular tachycardia, unspecified: Secondary | ICD-10-CM

## 2016-12-14 NOTE — Progress Notes (Signed)
HPI [name] [name] returns today for followup. He is a pleasant 71 yo man with VT and chronic systolic heart failure and LBBB who underwent BiV ICD insertion about 3 months ago. In the interim, he has done well with no chest pain or sob. No ICD therapies.  No Known Allergies   Current Outpatient Prescriptions  Medication Sig Dispense Refill  . albuterol (PROVENTIL HFA) 108 (90 Base) MCG/ACT inhaler Inhale 1-2 puffs into the lungs every 6 (six) hours as needed for wheezing or shortness of breath.    [name] amiodarone (PACERONE) 200 MG tablet 2 tabs twice daily x 1 week, then 1 tab twice daily x 2 wks, then 1 tab daily. 65 tablet 3  . aspirin EC 81 MG tablet Take 81 mg by mouth daily.      [name] atorvastatin (LIPITOR) 80 MG tablet Take 40 mg by mouth at bedtime.    . carvedilol (COREG) 6.25 MG tablet Take 1 tablet (6.25 mg total) by mouth 2 (two) times daily with a meal. 60 tablet 6  . Cholecalciferol (VITAMIN D-3 PO) Take 2,000 mg by mouth daily.     . dicloxacillin (DYNAPEN) 250 MG capsule Take 500 mg by mouth 3 (three) times daily.    . fluticasone (FLONASE ALLERGY RELIEF) 50 MCG/ACT nasal spray Place 2 sprays into both nostrils daily as needed for allergies or rhinitis.    . furosemide (LASIX) 40 MG tablet Take 1 tablet (40 mg total) by mouth daily. 30 tablet 6  . glipiZIDE (GLUCOTROL) 5 MG tablet Take 2.5 mg by mouth daily before breakfast.    . magnesium oxide (MAG-OX) 400 (241.3 Mg) MG tablet Take 1 tablet (400 mg total) by mouth daily. 30 tablet 6  . metFORMIN (GLUCOPHAGE) 1000 MG tablet Take 1 tablet (1,000 mg total) by mouth 2 (two) times daily with a meal. Resume on 09/06/2016    . nitroGLYCERIN (NITROSTAT) 0.4 MG SL tablet Place 1 tablet (0.4 mg total) under the tongue every 5 (five) minutes x 3 doses as needed for chest pain. 25 tablet 3  . omeprazole (PRILOSEC) 20 MG capsule Take 20 mg by mouth daily.    . ranitidine (ZANTAC) 150 MG tablet Take 150 mg by mouth daily as needed for  heartburn.     . Tiotropium Bromide Monohydrate (SPIRIVA HANDIHALER IN) Inhale 18 mcg into the lungs daily.    . valsartan (DIOVAN) 80 MG tablet Take 1 tablet (80 mg total) by mouth daily. 80 tablet 11   No current facility-administered medications for this visit.      Past Medical History:  Diagnosis Date  . Acid reflux   . AICD (automatic cardioverter/defibrillator) present    a. 08/2016 [employer] Jude (serial Number [id number]) biventricular ICD.  [name] Chronic systolic CHF (congestive heart failure) (HCC)    a. 08/2016 Echo: EF 20%, diffuse HK, inf, post AK, mod-sev MR, sev dil LA, mod TR, PASP .  [name] Complication of anesthesia    "was told I responded well to anesthesia"  . Coronary artery disease involving native coronary artery of native heart with angina pectoris (HCC)    a. 10/2012 MI after aortobifem bypass, found to have multivessel CAD -->CABG x3;  b. 08/2016 Cath: LM 60, LAD 80m, LCX 60ost/p, RCA 100p, VG->RPDA 50p, VG->OM1 ok, LIMA->LAD ok, EF 25%.  . Essential hypertension 03/03/2012  . History of blood transfusion 11/2012   "during his CABG"  . History of gout X 1  . History of stomach  ulcers 1970s  . Hyperlipidemia associated with type 2 diabetes mellitus (HCC) 09/16/2011  . Ischemic cardiomyopathy    a. 08/2016 Echo: EF 20%, diffuse HK, inf, post AK;  b. 08/2016 [employer] Jude (serial Number [id number]) biventricular ICD.  [name] PAD (peripheral artery disease) (HCC)    s/p aortobifemoral bypass 10/2012  . PTSD (post-traumatic stress disorder)   . Type II diabetes mellitus (HCC)   . Ventricular tachycardia (HCC)    a. 08/2016 St[employer] Jude (serial Number [id number]) biventricular ICD-->oral amio added.  . 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 Is now on chronic suppressive antibiotics with dicloxacillin    ROS:   All systems reviewed and negative except as noted in  the HPI.   Past Surgical History:  Procedure Laterality Date  . AORTO-FEMORAL BYPASS GRAFT Bilateral 10/2012   [facility]  . BI-VENTRICULAR IMPLANTABLE CARDIOVERTER DEFIBRILLATOR  (CRT-D)  09/03/2016  . CARDIAC CATHETERIZATION  11/2012   [facility] (? for abnormal Nuclear ST) --> no PCI, by report, patent grafts.  [name] CARDIAC CATHETERIZATION N/A 09/02/2016   Procedure: Left Heart Cath and Cors/Grafts Angiography;  Surgeon: Kathleene Hazel, MD;  Location: [facility] INVASIVE CV LAB;  Service: Cardiovascular;  Laterality: N/A;  . CARDIAC CATHETERIZATION  10/2012; 03/2013  . CHOLECYSTECTOMY  03/05/2012   Procedure: LAPAROSCOPIC CHOLECYSTECTOMY WITH INTRAOPERATIVE CHOLANGIOGRAM;  Surgeon: Emelia Loron, MD;  Location: [facility] OR;  Service: General;  Laterality: N/A;  . CORONARY ARTERY BYPASS GRAFT  11/2012   CABG X 3;  [facility]  . EP IMPLANTABLE DEVICE N/A 09/03/2016   Procedure: BiV ICD Insertion CRT-D;  Surgeon: Marinus Maw, MD;  Location: [facility] INVASIVE CV LAB;  Service: Cardiovascular;  Laterality: N/A;  . INGUINAL HERNIA REPAIR Left 1990s  . STERNOTOMY  2015   re-do sternotomy with metal place "sheild" placed. -Has chronic staph infection, on chronic suppressive medication  . UMBILICAL HERNIA REPAIR  03/05/2012   Procedure: HERNIA REPAIR UMBILICAL ADULT;  Surgeon: Emelia Loron, MD;  Location: [facility] OR;  Service: General;  Laterality: N/A;     Family History  Problem Relation Age of Onset  . Cancer Brother     esophageal     Social History   Social History  . Marital status: Married    Spouse name: N/A  . Number of children: N/A  . Years of education: N/A   Occupational History  . Not on file.   Social History Main Topics  . Smoking status: Current Every Day Smoker    Types: Cigars, Cigarettes  . Smokeless tobacco: Former Neurosurgeon    Types: Snuff, Chew     Comment: 09/03/2016 "used smokeless tobacco in my teens; quit smoking cigarettes in the early 1980s; smokes pipe only"  .  Alcohol use No  . Drug use: No  . Sexual activity: Not on file   Other Topics Concern  . Not on file   Social History Narrative  . No narrative on file     BP 124/78   Pulse 74   Ht 5\' 6"  (1.676 m)   Wt 158 lb (71.7 kg)   BMI 25.50 kg/m   Physical Exam:  Well appearing NAD HEENT: Unremarkable Neck:  No JVD, no thyromegally Lymphatics:  No adenopathy Back:  No CVA tenderness Lungs:  Clear HEART:  Regular rate rhythm, no murmurs, no rubs, no clicks Abd:  soft, positive bowel sounds, no organomegally, no rebound,  no guarding Ext:  2 plus pulses, no edema, no cyanosis, no clubbing Skin:  No rashes no nodules Neuro:  CN II through XII intact, motor grossly intact  DEVICE  Normal device function.  See PaceArt for details.   Assess/Plan: 1. VT - he has had no recurrent VT. Will follow. 2. Chronic systolic heart failure - his symptoms are class 2. He will continue his current meds. He is encouraged to maintain a low sodium diet. 3. ICD - his St. Jude BiV ICD is working normally. Will recheck in several months.  Leonia Reeves.D.

## 2016-12-14 NOTE — Patient Instructions (Signed)
Medication Instructions:    Your physician recommends that you continue on your current medications as directed. Please refer to the Current Medication list given to you today.  --- If you need a refill on your cardiac medications before your next appointment, please call your pharmacy. ---  Labwork:  None ordered  Testing/Procedures:  None ordered  Follow-Up: Remote monitoring is used to monitor your Pacemaker of ICD from home. This monitoring reduces the number of office visits required to check your device to one time per year. It allows [place] to keep an eye on the functioning of your device to ensure it is working properly. You are scheduled for a device check from home on 03/15/17. You may send your transmission at any time that day. If you have a wireless device, the transmission will be sent automatically. After your physician reviews your transmission, you will receive a postcard with your next transmission date.   Your physician wants you to follow-up in: January with Dr. Ladona Ridgel.  You will receive a reminder letter in the mail two months in advance. If you don't receive a letter, please call our office to schedule the follow-up appointment.  Thank you for choosing [facility]!!

## 2016-12-16 LAB — CUP PACEART INCLINIC DEVICE CHECK
Brady Statistic RA Percent Paced: 0.26 %
Brady Statistic RV Percent Paced: 95 %
HIGH POWER IMPEDANCE MEASURED VALUE: 59.625
Implantable Lead Location: 753858
Implantable Lead Location: 753860
Lead Channel Impedance Value: 400 Ohm
Lead Channel Pacing Threshold Amplitude: 0.5 V
Lead Channel Pacing Threshold Amplitude: 0.75 V
Lead Channel Pacing Threshold Pulse Width: 0.5 ms
Lead Channel Pacing Threshold Pulse Width: 0.5 ms
Lead Channel Sensing Intrinsic Amplitude: 3.8 mV
Lead Channel Setting Pacing Amplitude: 2 V
Lead Channel Setting Pacing Pulse Width: 0.5 ms
Lead Channel Setting Sensing Sensitivity: 0.5 mV
MDC IDC LEAD IMPLANT DT: 20171215
MDC IDC LEAD IMPLANT DT: 20171215
MDC IDC LEAD IMPLANT DT: 20171215
MDC IDC LEAD LOCATION: 753859
MDC IDC MSMT LEADCHNL LV PACING THRESHOLD AMPLITUDE: 1 V
MDC IDC MSMT LEADCHNL RA IMPEDANCE VALUE: 575 Ohm
MDC IDC MSMT LEADCHNL RV IMPEDANCE VALUE: 637.5 Ohm
MDC IDC MSMT LEADCHNL RV PACING THRESHOLD PULSEWIDTH: 0.5 ms
MDC IDC MSMT LEADCHNL RV SENSING INTR AMPL: 11.7 mV
MDC IDC PG IMPLANT DT: 20171215
MDC IDC PG SERIAL: 7368976
MDC IDC SESS DTM: 20180327132823
MDC IDC SET LEADCHNL LV PACING AMPLITUDE: 2 V
MDC IDC SET LEADCHNL LV PACING PULSEWIDTH: 0.5 ms
MDC IDC SET LEADCHNL RA PACING AMPLITUDE: 2 V

## 2017-01-14 ENCOUNTER — Ambulatory Visit (INDEPENDENT_AMBULATORY_CARE_PROVIDER_SITE_OTHER): Payer: Medicare Other

## 2017-01-14 DIAGNOSIS — I5022 Chronic systolic (congestive) heart failure: Secondary | ICD-10-CM | POA: Diagnosis not present

## 2017-01-14 DIAGNOSIS — Z95810 Presence of automatic (implantable) cardiac defibrillator: Secondary | ICD-10-CM

## 2017-01-14 NOTE — Progress Notes (Signed)
EPIC Encounter for ICM Monitoring  Patient Name: [name] [name] is a 71 y.o. male Date: 01/14/2017 Primary Care Physican: Charolett Bumpers, PA-C Primary Cardiologist: Herbie Baltimore  Electrophysiologist: Ladona Ridgel Dry Weight:154lbs  Bi-V Pacing: 89% (decreased from 96%on 11/23/2016)         Heart Failure questions reviewed, pt asymptomatic.  He stated he is feeling fine and taking all his meds as prescribed, no missed dosages.   Thoracic impedance is normal but was abnormal suggesting fluid accumulation from 01/02/2017 to 01/10/2017 and 12/04/2016 to 12/12/2016.  Current prescribed dose of Furosemide 40 mg 1 tablet daily.  Per Dr Herbie Baltimore note 11/12/2016, discussed Furosemide sliding scale and to take an additional dose of Lasix once or twice a week standing.  Labs: 10/27/2016 Creatinine 1.08, BUN 13, Potassium 4.9, Sodium 133 09/08/2016 Creatinine 1.19, BUN 11, Potassium 5.0, Sodium 135 09/03/2016 Creatinine 1.36, BUN 17, Potassium 3.9, Sodium 128 09/02/2016 Creatinine 1.13, BUN 14, Potassium 3.9, Sodium 134 09/01/2017 Creatinine 1.30, BUN 15, Potassium 5.3, Sodium 133  Recommendations: No changes.  He does not always follow a low salt diet but most of the time he does.  Encouraged to call for fluid symptoms.  Follow-up plan: ICM clinic phone appointment on 02/15/2017.  Office appointment scheduled on 02/28/2017 with Dr Herbie Baltimore.  Copy of ICM check sent to primary cardiologist and device physician.   3 month ICM trend: 01/14/2017   1 Year ICM trend:      Karie Soda, RN 01/14/2017 8:05 AM

## 2017-02-15 ENCOUNTER — Ambulatory Visit (INDEPENDENT_AMBULATORY_CARE_PROVIDER_SITE_OTHER): Payer: Medicare Other

## 2017-02-15 ENCOUNTER — Telehealth: Payer: Self-pay | Admitting: Internal Medicine

## 2017-02-15 DIAGNOSIS — Z95810 Presence of automatic (implantable) cardiac defibrillator: Secondary | ICD-10-CM | POA: Diagnosis not present

## 2017-02-15 DIAGNOSIS — I5022 Chronic systolic (congestive) heart failure: Secondary | ICD-10-CM

## 2017-02-15 NOTE — Progress Notes (Signed)
EPIC Encounter for ICM Monitoring  Patient Name: [name] [name] is a 71 y.o. male Date: 02/15/2017 Primary Care Physican: Charolett Bumpers, PA-C Primary Cardiologist: Herbie Baltimore  Electrophysiologist: Ladona Ridgel Dry Weight:152.6lbs  Bi-V Pacing: 91%      Heart Failure questions reviewed, pt asymptomatic    Thoracic impedance normal but was abnormal suggesting fluid accumulation from 01/20/2017 to 01/22/2017,  02/02/2017 to 02/06/2017 and 02/11/2017 to 02/12/2017  Current prescribed dose of Furosemide 40 mg 1 tablet daily. Per Dr Herbie Baltimore note 11/12/2016, discussed Furosemide sliding scale and to take anadditional dose of Lasix once or twice a week standing.  Labs: 10/27/2016 Creatinine 1.08, BUN 13, Potassium 4.9, Sodium 133 09/08/2016 Creatinine 1.19, BUN 11, Potassium 5.0, Sodium 135 09/03/2016 Creatinine 1.36, BUN 17, Potassium 3.9, Sodium 128 09/02/2016 Creatinine 1.13, BUN 14, Potassium 3.9, Sodium 134 09/01/2017 Creatinine 1.30, BUN 15, Potassium 5.3, Sodium 133  Recommendations: No changes. He said he knows there are some days that was above the 20000 mg of salt a day but most days he tries to stay below that number.  Encouraged to call for fluid symptoms or use local ER for any urgent symptoms.  Follow-up plan: ICM clinic phone appointment on 03/21/2017.  Office appointment scheduled on 03/09/2017 with Dr Herbie Baltimore.  Copy of ICM check sent to device physician.   3 month ICM trend: 02/15/2017   1 Year ICM trend:      Karie Soda, RN 02/15/2017 9:22 AM

## 2017-02-15 NOTE — Telephone Encounter (Signed)
I doubt that the dye would have an effect on his PPM but I have referred [name] [name] to [place] tech services to be sure. Device information given. She will call back if she doesn't get the information she needs.

## 2017-02-15 NOTE — Telephone Encounter (Signed)
New message     Pt daughter is calling about pt. She said pt is treated for macular degeneration and she needs to know if pt can have the dye used to check his eye. The doctors were unsure if it could be used with his pacemaker.

## 2017-02-28 ENCOUNTER — Ambulatory Visit (HOSPITAL_COMMUNITY): Payer: Medicare Other | Attending: Cardiology

## 2017-02-28 ENCOUNTER — Other Ambulatory Visit: Payer: Self-pay

## 2017-02-28 DIAGNOSIS — I472 Ventricular tachycardia, unspecified: Secondary | ICD-10-CM

## 2017-02-28 DIAGNOSIS — Z720 Tobacco use: Secondary | ICD-10-CM | POA: Diagnosis not present

## 2017-02-28 DIAGNOSIS — E785 Hyperlipidemia, unspecified: Secondary | ICD-10-CM | POA: Insufficient documentation

## 2017-02-28 DIAGNOSIS — I447 Left bundle-branch block, unspecified: Secondary | ICD-10-CM | POA: Diagnosis not present

## 2017-02-28 DIAGNOSIS — I119 Hypertensive heart disease without heart failure: Secondary | ICD-10-CM | POA: Insufficient documentation

## 2017-02-28 DIAGNOSIS — I429 Cardiomyopathy, unspecified: Secondary | ICD-10-CM | POA: Diagnosis present

## 2017-02-28 DIAGNOSIS — I080 Rheumatic disorders of both mitral and aortic valves: Secondary | ICD-10-CM | POA: Insufficient documentation

## 2017-02-28 DIAGNOSIS — E119 Type 2 diabetes mellitus without complications: Secondary | ICD-10-CM | POA: Diagnosis not present

## 2017-02-28 DIAGNOSIS — I10 Essential (primary) hypertension: Secondary | ICD-10-CM | POA: Diagnosis not present

## 2017-02-28 DIAGNOSIS — I420 Dilated cardiomyopathy: Secondary | ICD-10-CM | POA: Diagnosis not present

## 2017-02-28 DIAGNOSIS — Z95810 Presence of automatic (implantable) cardiac defibrillator: Secondary | ICD-10-CM | POA: Diagnosis not present

## 2017-02-28 HISTORY — PX: TRANSTHORACIC ECHOCARDIOGRAM: SHX275

## 2017-03-09 ENCOUNTER — Ambulatory Visit (INDEPENDENT_AMBULATORY_CARE_PROVIDER_SITE_OTHER): Payer: Medicare Other | Admitting: Cardiology

## 2017-03-09 ENCOUNTER — Encounter: Payer: Self-pay | Admitting: Cardiology

## 2017-03-09 DIAGNOSIS — E1169 Type 2 diabetes mellitus with other specified complication: Secondary | ICD-10-CM | POA: Diagnosis not present

## 2017-03-09 DIAGNOSIS — I200 Unstable angina: Secondary | ICD-10-CM | POA: Diagnosis not present

## 2017-03-09 DIAGNOSIS — I5042 Chronic combined systolic (congestive) and diastolic (congestive) heart failure: Secondary | ICD-10-CM | POA: Diagnosis not present

## 2017-03-09 DIAGNOSIS — F17200 Nicotine dependence, unspecified, uncomplicated: Secondary | ICD-10-CM

## 2017-03-09 DIAGNOSIS — I25119 Atherosclerotic heart disease of native coronary artery with unspecified angina pectoris: Secondary | ICD-10-CM | POA: Diagnosis not present

## 2017-03-09 DIAGNOSIS — I420 Dilated cardiomyopathy: Secondary | ICD-10-CM | POA: Diagnosis not present

## 2017-03-09 DIAGNOSIS — E785 Hyperlipidemia, unspecified: Secondary | ICD-10-CM

## 2017-03-09 NOTE — Progress Notes (Signed)
PCP: Merry Proud, PA-C  Clinic Note: No chief complaint on file.   HPI: [name] [name] is a 72 y.o. male with a PMH below who presents today for 4 month f/u for ICM, CAD. Marland KitchenHe has a history of sustained VT status post ICD placement back in December 2017. This is when I first met him -> his presenting symptoms were more consistent with heart failure exacerbation and possible unstable angina. However when his echocardiogram revealed EF of roughly 20% and he had sustained VT, he underwent cardiac catheterization showing no culprit lesion.   He has known CAD- (s/p 3 V CABG) that was diagnosed after his AoBiFem Bypass in Feb 2014 - had Ischemic VT post-operatively, but never had any Sx of Angina --> Cath (this was @ [facility] --> relook Cath in July 2014.  Post-op CABG was complicated by Sternal Wound Infxn --> redo-sternotomy with Steel-Plate insertion (on lifelong Dicloxacillin).  After his CABG, he was lost to Cardiology F/u (by his report, the [place] never told him to follow-up with Cardiology).  [name] [name] was last seen on March 27 by Dr. Lovena Le to follow-up as ICD. I last saw him in February. The plan was for him to convert to [facility], however this was not able to occur simply because the [place] would not cover Entresto. He therefore was placed back on Diovan.  Recent Hospitalizations: None  Studies Personally Reviewed - (if available, images/films reviewed: From Epic Chart or Care Everywhere)  2-D Echo 02/28/2017: EF remains 20 1125% which really reduced function. Diffuse hypokinesis. "Grade 1 diastolic dysfunction ". Severely calcified aortic valve with restricted mobility (functioning bicuspid) however no suggestion of stenosis.  Interval History: Messiyah returns today doing remarkably well given his reduced EF. He is currently doing gardening in the mornings and evenings to avoid the heat of the day. He is able to do pretty much what he wants to do maybe just a little slower. He  is not having any significant exertional dyspnea, or rapid irregular heartbeats or palpitations. No PND, orthopnea or edema. He is very diligent with monitoring his salt intake and adjusting his Lasix accordingly to weight. He rarely has to use an additional dose, that is usually related to when he has held a dose for some reason due to travel or having an appointment or something. She maintains a stable dose. He was even able to eat out at a restaurant with his grandkids and did not notice any weight gain. He has not had any recurrence of any arrhythmias no sig. Near syncope, TIA/amaurosis fugax. No anginal symptoms with rest or exertion.    No melena, hematochezia, hematuria, or epstaxis. No claudication.  ROS: A comprehensive was performed. Review of Systems  Constitutional: Negative for malaise/fatigue.  HENT: Negative for congestion and sinus pain.   Respiratory: Negative for shortness of breath.   Cardiovascular:       Per history of present illness  Gastrointestinal: Negative for constipation, heartburn, nausea and vomiting.  Genitourinary: Negative for dysuria and urgency.  Musculoskeletal: Positive for joint pain (Normal arthritis pains).  Neurological: Negative for dizziness.  Endo/Heme/Allergies: Negative for environmental allergies.  Psychiatric/Behavioral: Negative.   All other systems reviewed and are negative.   I have reviewed and (if needed) personally updated the patient's problem list, medications, allergies, past medical and surgical history, social and family history.   Past Medical History:  Diagnosis Date  . Acid reflux   . AICD (automatic cardioverter/defibrillator) present    a. 08/2016  St. Jude (serial Number [id number]) biventricular ICD.  [name] Chronic systolic CHF (congestive heart failure) ([place])    a. 08/2016 Echo: EF 20%, diffuse HK, inf, post AK, mod-sev MR, sev dil LA, mod TR, PASP 53mHg.  .Marland KitchenComplication of anesthesia    "was told I responded well to  anesthesia"  . Coronary artery disease involving native coronary artery of native heart with angina pectoris (HC[place]    a. 10/2012 MI after aortobifem bypass, found to have multivessel CAD -->CABG x3;  b. 08/2016 Cath: LM 60, LAD 975mLCX 60ost/p, RCA 100p, VG->RPDA 50p, VG->OM1 ok, LIMA->LAD ok, EF 25%.  . Essential hypertension 03/03/2012  . History of blood transfusion 11/2012   "during his CABG"  . History of gout X 1  . History of stomach ulcers 1970s  . Hyperlipidemia associated with type 2 diabetes mellitus (HCPalo Pinto12/27/2012  . Ischemic cardiomyopathy    a. 08/2016 Echo: EF 20%, diffuse HK, inf, post AK;  b. 08/2016 St. Jude (serial Number [id number] ICD.  . Marland KitchenAD (peripheral artery disease) (HCLake [place] the Hills   s/p aortobifemoral bypass 10/2012  . PTSD (post-traumatic stress disorder)   . Type II diabetes mellitus (HCN[place]  . Ventricular tachycardia ([place]   a. 08/2016 St. Jude (serial Number [id number] ICD-->oral amio added.  . 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 Is now on chronic suppressive antibiotics with dicloxacillin    Past Surgical History:  Procedure Laterality Date  . AORTO-FEMORAL [facility] GRAFT [facility]PLANTABLE CARDIOVERTER DEFIBRILLATOR  (CRT-D)  09/03/2016  . [facility]  [facility]   [facility]) --> no PCI, by report, patent grafts.  . Marland KitchenARDIAC CATHETERIZATION N/A 09/02/2016   Procedure: Left Heart Cath and Cors/Grafts Angiography;  Surgeon: ChBurnell BlanksMD;  Location: M[facility] LAB;  Service: Cardiovascular;  Laterality: N/A;  . CARDIAC CATHETERIZATION  10/2012; 03/2013  . CHOLECYSTECTOMY  03/05/2012   Procedure: LAPAROSCOPIC CHOLECYSTECTOMY WITH INTRAOPERATIVE CHOLANGIOGRAM;  Surgeon: MaRolm BookbinderMD;  Location: MCL[place] Service:  General;  Laterality: N/A;  . CORONARY ARTERY BYPASS GRAFT  11/2012   [facility] N/A 09/03/2016   Procedure: BiV ICD Insertion CRT-D;  Surgeon: GrEvans LanceMD;  Location: MCReevesV LAB;  Service: Cardiovascular;  Laterality: N/A;  . INGUINAL HERNIA REPAIR Left 1990s  . STERNOTOMY  2015   re-do sternotomy with metal place "sheild" placed. -Has chronic staph infection, on chronic suppressive medication  . TRANSTHORACIC ECHOCARDIOGRAM  02/28/2017   EF remains 20 1125% which really reduced function. Diffuse hypokinesis. "Grade 1 diastolic dysfunction ". Severely calcified aortic valve with restricted mobility (functioning bicuspid) however no suggestion of stenosis.  . Marland KitchenMBILICAL HERNIA REPAIR  03/05/2012   Procedure: HERNIA REPAIR UMBILICAL ADULT;  Surgeon: MaRolm BookbinderMD;  Location: M[facility] Service: General;  Laterality: N/A;   Cath 08/2016.    Current Meds  Medication Sig  . albuterol (PROVENTIL HFA) 108 (90 Base) MCG/ACT inhaler Inhale 1-2 puffs into the lungs every 6 (six) hours as needed for wheezing or shortness of breath.  . Marland Kitchenmiodarone (PACERONE) 200 MG tablet 2 tabs twice daily x 1 week, then 1 tab twice daily x 2 wks, then 1 tab daily.  . Marland Kitchenspirin EC 81 MG tablet Take 81 mg by mouth  daily.    . atorvastatin (LIPITOR) 80 MG tablet Take 40 mg by mouth at bedtime.  . carvedilol (COREG) 6.25 MG tablet Take 1 tablet (6.25 mg total) by mouth 2 (two) times daily with a meal.  . Cholecalciferol (VITAMIN D-3 PO) Take 2,000 mg by mouth daily.   . dicloxacillin (DYNAPEN) 250 MG capsule Take 500 mg by mouth 3 (three) times daily.  . fluticasone (FLONASE ALLERGY RELIEF) 50 MCG/ACT nasal spray Place 2 sprays into both nostrils daily as needed for allergies or rhinitis.  . furosemide (LASIX) 40 MG tablet Take 1 tablet (40 mg total) by mouth daily.  [name] glipiZIDE (GLUCOTROL) 5 MG tablet Take 2.5 mg by mouth daily before breakfast.  . magnesium oxide  (MAG-OX) 400 (241.3 Mg) MG tablet Take 1 tablet (400 mg total) by mouth daily.  . metFORMIN (GLUCOPHAGE) 1000 MG tablet Take 1 tablet (1,000 mg total) by mouth 2 (two) times daily with a meal. Resume on 09/06/2016  . nitroGLYCERIN (NITROSTAT) 0.4 MG SL tablet Place 1 tablet (0.4 mg total) under the tongue every 5 (five) minutes x 3 doses as needed for chest pain.  [name] omeprazole (PRILOSEC) 20 MG capsule Take 20 mg by mouth daily.  . ranitidine (ZANTAC) 150 MG tablet Take 150 mg by mouth daily as needed for heartburn.   . Tiotropium Bromide Monohydrate (SPIRIVA HANDIHALER IN) Inhale 18 mcg into the lungs daily.  . valsartan (DIOVAN) 80 MG tablet Take 1 tablet (80 mg total) by mouth daily.    No Known Allergies  Social History   Social History  . Marital status: Married    Spouse name: N/A  . Number of children: N/A  . Years of education: N/A   Social History Main Topics  . Smoking status: Current Every Day Smoker    Types: Cigars, Cigarettes  . Smokeless tobacco: Former Systems developer    Types: Snuff, Chew     Comment: 09/03/2016 "used smokeless tobacco in my teens; quit smoking cigarettes in the early 1980s; smokes pipe only"  . Alcohol use No  . Drug use: No  . Sexual activity: Not Asked   Other Topics Concern  . None   Social History Narrative  . None    family history includes Cancer in his brother.  Wt Readings from Last 3 Encounters:  03/09/17 153 lb 12.8 oz (69.8 kg)  12/14/16 158 lb (71.7 kg)  11/09/16 153 lb (69.4 kg)    PHYSICAL EXAM BP 115/70   Pulse 72   Ht '5\' 6"'  (1.676 m)   Wt 153 lb 12.8 oz (69.8 kg)   SpO2 96%   BMI 24.82 kg/m  General appearance: alert, cooperative, appears stated age, no distress. Well-nourished, well-groomed. HEENT: [place]/AT, EOMI, MMM, anicteric sclera Neck: no adenopathy, no carotid bruit and no JVD Lungs: clear to auscultation bilaterally, normal percussion bilaterally and non-labored Heart: RRR with normal S1 and S2. He does have an S4  gallop. Otherwise no M/R/G. Nondisplaced PMI. Abdomen: soft, non-tender; bowel sounds normal; no masses,  no organomegaly; no HJR Extremities: extremities normal, atraumatic, no cyanosis, or edema  Pulses: 2+ and symmetric;  Skin: mobility and turgor normal, no evidence of bleeding or bruising and no lesions noted or  Neurologic: Mental status: Alert & oriented x 3, thought content appropriate; non-focal exam.  Pleasant mood & affect.   Adult ECG Report N/a  Other studies Reviewed: Additional studies/ records that were reviewed today include:  Recent Labs:   Lab Results  Component Value Date  CHOL 87 09/02/2016   HDL 26 (L) 09/02/2016   LDLCALC 43 09/02/2016   TRIG 92 09/02/2016   CHOLHDL 3.3 09/02/2016   Lab Results  Component Value Date   CREATININE 1.08 10/27/2016   BUN 13 10/27/2016   NA 133 (L) 10/27/2016   K 4.9 10/27/2016   CL 94 (L) 10/27/2016   CO2 26 10/27/2016     ASSESSMENT / PLAN: Problem List Items Addressed This Visit    Chronic combined systolic and diastolic CHF, NYHA class 2 and ACA/AHA stage C (HCC) (Chronic)    Stable class II symptoms on excellent medication. AICD is also checking thoracic impedance is a secondary warning method. He is using sliding scale technique and usually staying head of the volume.  Ostial medications as discussed elsewhere      Congestive dilated cardiomyopathy ([place]) - Mostly Ischemic, but Also Potentially Arrhythmogenic (Chronic)    Unfortunately, his EF did not improve very substantially, however symptomatically he has notably stabilized and has not had any further symptoms to suggest exacerbation of his CHF. He doesn't seem to have had any further arrhythmias on amiodarone. We tried to convert him to [place], but were unsuccessful thanks to the [place]. He is however on a stable dose of carvedilol and valsartan. His diuretic dose is stable and he is using minimal additional doses. He is fully aware of sliding scale management and  watching his salt intake.  He tolerated the increased dose of Diovan without trouble.      Coronary artery disease involving native coronary artery of native heart with angina pectoris (HCC) (Chronic)    No anginal symptoms. Widely patent grafts on cath. He is on stable dose of aspirin, statin and beta blocker along with ARB      Hyperlipidemia associated with type 2 diabetes mellitus ([place]) (Chronic)    Lipids look relatively well-controlled back in December. If not checked by the [place], we will recheck this settle labs when I see him in 6 month follow-up. Continue current dose of statin.      Progressive angina (HCC) - atypical - related to arrhythmia    Probably more related to having arrhythmia as opposed to true angina. Grafts are widely patent, likely had microvascular ischemia in the setting of his ventricular tachycardia.  No requirement for anything beyond beta blocker and ARB.      Tobacco use disorder (Chronic)    Really does not seem to be actually smoking anymore.         Current medicines are reviewed at length with the patient today. (+/- concerns) n/a The following changes have been made: n/a  Patient Instructions  GREAT JOB!!! [place]!!!!  No changes in treatment or medication.      Your physician wants you to follow-up in [place]. You will receive a reminder letter in the mail two months in advance. If you don't receive a letter, please call our office to schedule the follow-up appointment.    Studies Ordered:   No orders of the defined types were placed in this encounter.     Glenetta Hew, M.D., M.S. Interventional Cardiologist   Pager # [phone] Phone # [phone] [address] [place], [place] [address]

## 2017-03-09 NOTE — Patient Instructions (Signed)
GREAT JOB!!! KEEP UP THE WORK!!!!  No changes in treatment or medication.      Your physician wants you to follow-up in 6 MONTHS WITH DR HARDING. You will receive a reminder letter in the mail two months in advance. If you don't receive a letter, please call our office to schedule the follow-up appointment.

## 2017-03-10 ENCOUNTER — Encounter: Payer: Self-pay | Admitting: Cardiology

## 2017-03-10 NOTE — Assessment & Plan Note (Signed)
Stable class II symptoms on excellent medication. AICD is also checking thoracic impedance is a secondary warning method. He is using sliding scale technique and usually staying head of the volume.  Ostial medications as discussed elsewhere

## 2017-03-10 NOTE — Assessment & Plan Note (Signed)
Unfortunately, his EF did not improve very substantially, however symptomatically he has notably stabilized and has not had any further symptoms to suggest exacerbation of his CHF. He doesn't seem to have had any further arrhythmias on amiodarone. We tried to convert him to [place], but were unsuccessful thanks to the [place]. He is however on a stable dose of carvedilol and valsartan. His diuretic dose is stable and he is using minimal additional doses. He is fully aware of sliding scale management and watching his salt intake.  He tolerated the increased dose of Diovan without trouble.

## 2017-03-10 NOTE — Assessment & Plan Note (Signed)
Really does not seem to be actually smoking anymore.

## 2017-03-10 NOTE — Assessment & Plan Note (Signed)
Probably more related to having arrhythmia as opposed to true angina. Grafts are widely patent, likely had microvascular ischemia in the setting of his ventricular tachycardia.  No requirement for anything beyond beta blocker and ARB.

## 2017-03-10 NOTE — Assessment & Plan Note (Signed)
No anginal symptoms. Widely patent grafts on cath. He is on stable dose of aspirin, statin and beta blocker along with ARB

## 2017-03-10 NOTE — Assessment & Plan Note (Signed)
Lipids look relatively well-controlled back in December. If not checked by the [place], we will recheck this settle labs when I see him in 6 month follow-up. Continue current dose of statin.

## 2017-03-21 ENCOUNTER — Ambulatory Visit (INDEPENDENT_AMBULATORY_CARE_PROVIDER_SITE_OTHER): Payer: Medicare Other | Admitting: *Deleted

## 2017-03-21 ENCOUNTER — Telehealth: Payer: Self-pay

## 2017-03-21 DIAGNOSIS — Z95810 Presence of automatic (implantable) cardiac defibrillator: Secondary | ICD-10-CM | POA: Diagnosis not present

## 2017-03-21 DIAGNOSIS — I472 Ventricular tachycardia, unspecified: Secondary | ICD-10-CM

## 2017-03-21 DIAGNOSIS — I5042 Chronic combined systolic (congestive) and diastolic (congestive) heart failure: Secondary | ICD-10-CM | POA: Diagnosis not present

## 2017-03-21 NOTE — Progress Notes (Signed)
EPIC Encounter for ICM Monitoring  Patient Name: [name] [name] is a 71 y.o. male Date: 03/21/2017 Primary Care Physican: Charolett Bumpers, PA-C Primary Cardiologist: Herbie Baltimore  Electrophysiologist: Fuller Mandril Weight:Last known weight 152.6lbs  Bi-V Pacing: 88%          Attempted call to patient and unable to reach.  Left message to return call.  Transmission reviewed.    Thoracic impedance is at baseline today but was abnormal suggesting fluid accumulation from 03/10/2017 to 03/21/2017.  Prescribed dosage: Furosemide 40 mg 1 tablet daily. Per Dr Herbie Baltimore note 03/09/2017, patient can take additional Lasix if needed for fluid symptoms.   Labs: 10/27/2016 Creatinine 1.08, BUN 13, Potassium 4.9, Sodium 133 09/08/2016 Creatinine 1.19, BUN 11, Potassium 5.0, Sodium 135 09/03/2016 Creatinine 1.36, BUN 17, Potassium 3.9, Sodium 128 09/02/2016 Creatinine 1.13, BUN 14, Potassium 3.9, Sodium 134 09/01/2017 Creatinine 1.30, BUN 15, Potassium 5.3, Sodium 133  Recommendations: NONE - Unable to reach patient   Follow-up plan: ICM clinic phone appointment on 05/02/2017 since office appointment scheduled 03/31/2017 with Gypsy Balsam, NP to check defibrillator.  Copy of ICM check sent to primary cardiologist and device physician.   3 month ICM trend: 03/21/2017   1 Year ICM trend:      Karie Soda, RN 03/21/2017 2:40 PM

## 2017-03-21 NOTE — Telephone Encounter (Signed)
Remote ICM transmission received.  Attempted patient call and left message to return call.   

## 2017-03-21 NOTE — Progress Notes (Signed)
Remote ICD transmission.   

## 2017-03-22 ENCOUNTER — Encounter: Payer: Self-pay | Admitting: Cardiology

## 2017-03-22 LAB — CUP PACEART REMOTE DEVICE CHECK
Battery Remaining Longevity: 79 mo
Battery Remaining Percentage: 88 %
Battery Voltage: 3.07 V
Brady Statistic AS VP Percent: 87 %
Brady Statistic AS VS Percent: 5 %
HIGH POWER IMPEDANCE MEASURED VALUE: 61 Ohm
HighPow Impedance: 61 Ohm
Implantable Lead Implant Date: 20171215
Implantable Lead Implant Date: 20171215
Implantable Lead Location: 753858
Implantable Lead Location: 753860
Lead Channel Impedance Value: 400 Ohm
Lead Channel Impedance Value: 650 Ohm
Lead Channel Pacing Threshold Amplitude: 0.75 V
Lead Channel Pacing Threshold Pulse Width: 0.5 ms
Lead Channel Pacing Threshold Pulse Width: 0.5 ms
Lead Channel Sensing Intrinsic Amplitude: 11.7 mV
Lead Channel Setting Pacing Amplitude: 2 V
Lead Channel Setting Pacing Pulse Width: 0.5 ms
MDC IDC LEAD IMPLANT DT: 20171215
MDC IDC LEAD LOCATION: 753859
MDC IDC MSMT LEADCHNL LV PACING THRESHOLD AMPLITUDE: 1 V
MDC IDC MSMT LEADCHNL RA IMPEDANCE VALUE: 480 Ohm
MDC IDC MSMT LEADCHNL RA PACING THRESHOLD PULSEWIDTH: 0.5 ms
MDC IDC MSMT LEADCHNL RA SENSING INTR AMPL: 2.5 mV
MDC IDC MSMT LEADCHNL RV PACING THRESHOLD AMPLITUDE: 0.5 V
MDC IDC PG IMPLANT DT: 20171215
MDC IDC PG SERIAL: 7368976
MDC IDC SESS DTM: 20180702064817
MDC IDC SET LEADCHNL LV PACING AMPLITUDE: 2 V
MDC IDC SET LEADCHNL LV PACING PULSEWIDTH: 0.5 ms
MDC IDC SET LEADCHNL RV PACING AMPLITUDE: 2 V
MDC IDC SET LEADCHNL RV SENSING SENSITIVITY: 0.5 mV
MDC IDC STAT BRADY AP VP PERCENT: 1 %
MDC IDC STAT BRADY AP VS PERCENT: 1 %
MDC IDC STAT BRADY RA PERCENT PACED: 1 %

## 2017-03-22 NOTE — Progress Notes (Signed)
Returned call to daughter Gailen Shelter [facility]) as requested by voice mail message.  She reported patient was not clear on the the outcome of his report today.  Explained remote transmission and how the report shows fluid levels.  She reported her Dad is a very concise person and he did not understand how the report shows fluid accumulation when he does not have symptoms.  Discussed heart failure symptoms and relationship to report.  She would explained to patient how the report may show fluid accumulation before symptoms may occur.  She reported he really does follow a very strict salt intake and reads all the food labels. Advised to follow Dr Elissa Hefty instructions on when to take extra Lasix and to call me if fluid symptoms persist after taking the extra medication.  She stated she understood.  No further questions.

## 2017-03-22 NOTE — Progress Notes (Signed)
Patient returned call.  Transmission reviewed.  He is unsure what may have caused decreased impedance.  He is very vigilant on monitoring salt intake and most days his intake is between 500 mg - 1000 mg a day.  He mainly eats fresh vegetables and reads the food labels for salt content.  He may be drinking more fluids than normal due to excessive hot weather. No changes today and reminded him of appointment with Gypsy Balsam, NP next week.  Next ICM remote transmission from home is 05/02/2017.  Encouraged to call for any fluid symptoms

## 2017-03-29 NOTE — Progress Notes (Signed)
Electrophysiology Office Note Date: 03/31/2017  ID:  [name] [name], DOB 05/01/1946, MRN [id number]  PCP: Charolett Bumpers, PA-C Primary Cardiologist: Herbie Baltimore Electrophysiologist: Ladona Ridgel  CC: CRT follow up  [name] [name] is a 71 y.o. male seen today for Dr Ladona Ridgel.  He presents today for routine electrophysiology followup. Recent echo demonstrated persistently decreased EF since CRT implant. He presents today for EKG optimization.  Since last being seen in clinic, the patient reports doing very well. He remains active gardening. He states that he is clearly better than prior to device implant, but feels he is not back to baseline. He denies chest pain, palpitations, dyspnea, PND, orthopnea, nausea, vomiting, dizziness, syncope, edema, weight gain, or early satiety.  He has not had ICD shocks.   Device History: STJ CRTD implanted 2017 for ICM, CHF, VT History of appropriate therapy: No History of AAD therapy: No   Past Medical History:  Diagnosis Date  . Acid reflux   . AICD (automatic cardioverter/defibrillator) present    a. 08/2016 St. Jude (serial Number [id number]) biventricular ICD.  [name] Chronic systolic CHF (congestive heart failure) (HCC)    a. 08/2016 Echo: EF 20%, diffuse HK, inf, post AK, mod-sev MR, sev dil LA, mod TR, PASP .  [name] Complication of anesthesia    "was told I responded well to anesthesia"  . Coronary artery disease involving native coronary artery of native heart with angina pectoris (HCC)    a. 10/2012 MI after aortobifem bypass, found to have multivessel CAD -->CABG x3;  b. 08/2016 Cath: LM 60, LAD 66m, LCX 60ost/p, RCA 100p, VG->RPDA 50p, VG->OM1 ok, LIMA->LAD ok, EF 25%.  . Essential hypertension 03/03/2012  . History of blood transfusion 11/2012   "during his CABG"  . History of gout X 1  . History of stomach ulcers 1970s  . Hyperlipidemia associated with type 2 diabetes mellitus (HCC) 09/16/2011  . Ischemic cardiomyopathy    a. 08/2016 Echo: EF 20%,  diffuse HK, inf, post AK;  b. 08/2016 St. Jude (serial Number [id number]) biventricular ICD.  [name] PAD (peripheral artery disease) (HCC)    s/p aortobifemoral bypass 10/2012  . PTSD (post-traumatic stress disorder)   . Type II diabetes mellitus (HCC)   . Ventricular tachycardia (HCC)    a. 08/2016 St. Jude (serial Number [id number]) biventricular ICD-->oral amio added.  . 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 Is now on chronic suppressive antibiotics with dicloxacillin   Past Surgical History:  Procedure Laterality Date  . AORTO-FEMORAL BYPASS GRAFT Bilateral 10/2012   [facility]  . BI-VENTRICULAR IMPLANTABLE CARDIOVERTER DEFIBRILLATOR  (CRT-D)  09/03/2016  . CARDIAC CATHETERIZATION  11/2012   [facility] (? for abnormal Nuclear ST) --> no PCI, by report, patent grafts.  [name] CARDIAC CATHETERIZATION N/A 09/02/2016   Procedure: Left Heart Cath and Cors/Grafts Angiography;  Surgeon: Kathleene Hazel, MD;  Location[facility] INVASIVE CV LAB;  Service: Cardiovascular;  Laterality: N/A;  . CARDIAC CATHETERIZATION  10/2012; 03/2013  . CHOLECYSTECTOMY  03/05/2012   Procedure: LAPAROSCOPIC CHOLECYSTECTOMY WITH INTRAOPERATIVE CHOLANGIOGRAM;  Surgeon: Emelia Loron, MD;  Location: [facility] OR;  Service: General;  Laterality: N/A;  . CORONARY ARTERY BYPASS GRAFT  11/2012   CABG X 3;  [facility]  . EP IMPLANTABLE DEVICE N/A 09/03/2016   Procedure: BiV ICD Insertion CRT-D;  Surgeon: Marinus Maw, MD;  Location: [facility] INVASIVE CV LAB;  Service: Cardiovascular;  Laterality:  N/A;  . INGUINAL HERNIA REPAIR Left 1990s  . STERNOTOMY  2015   re-do sternotomy with metal place "sheild" placed. -Has chronic staph infection, on chronic suppressive medication  . TRANSTHORACIC ECHOCARDIOGRAM  02/28/2017   EF remains 20 1125% which really reduced function. Diffuse hypokinesis. "Grade 1 diastolic dysfunction  ". Severely calcified aortic valve with restricted mobility (functioning bicuspid) however no suggestion of stenosis.  [name] UMBILICAL HERNIA REPAIR  03/05/2012   Procedure: HERNIA REPAIR UMBILICAL ADULT;  Surgeon: Emelia Loron, MD;  Location: [facility] OR;  Service: General;  Laterality: N/A;    Current Outpatient Prescriptions  Medication Sig Dispense Refill  . albuterol (PROVENTIL HFA) 108 (90 Base) MCG/ACT inhaler Inhale 1-2 puffs into the lungs every 6 (six) hours as needed for wheezing or shortness of breath.    [name] amiodarone (PACERONE) 200 MG tablet 2 tabs twice daily x 1 week, then 1 tab twice daily x 2 wks, then 1 tab daily. 65 tablet 3  . aspirin EC 81 MG tablet Take 81 mg by mouth daily.      [name] atorvastatin (LIPITOR) 80 MG tablet Take 40 mg by mouth at bedtime.    . carvedilol (COREG) 6.25 MG tablet Take 1 tablet (6.25 mg total) by mouth 2 (two) times daily with a meal. 60 tablet 6  . Cholecalciferol (VITAMIN D-3 PO) Take 2,000 mg by mouth daily.     . dicloxacillin (DYNAPEN) 250 MG capsule Take 500 mg by mouth 3 (three) times daily.    . fluticasone (FLONASE ALLERGY RELIEF) 50 MCG/ACT nasal spray Place 2 sprays into both nostrils daily as needed for allergies or rhinitis.    . furosemide (LASIX) 40 MG tablet Take 1 tablet (40 mg total) by mouth daily. 30 tablet 6  . glipiZIDE (GLUCOTROL) 5 MG tablet Take 2.5 mg by mouth daily before breakfast.    . magnesium oxide (MAG-OX) 400 (241.3 Mg) MG tablet Take 1 tablet (400 mg total) by mouth daily. 30 tablet 6  . metFORMIN (GLUCOPHAGE) 1000 MG tablet Take 1 tablet (1,000 mg total) by mouth 2 (two) times daily with a meal. Resume on 09/06/2016    . nitroGLYCERIN (NITROSTAT) 0.4 MG SL tablet Place 1 tablet (0.4 mg total) under the tongue every 5 (five) minutes x 3 doses as needed for chest pain. 25 tablet 3  . omeprazole (PRILOSEC) 20 MG capsule Take 20 mg by mouth daily.    . ranitidine (ZANTAC) 150 MG tablet Take 150 mg by mouth daily as needed for  heartburn.     . Tiotropium Bromide Monohydrate (SPIRIVA HANDIHALER IN) Inhale 18 mcg into the lungs daily.    . valsartan (DIOVAN) 80 MG tablet Take 1 tablet (80 mg total) by mouth daily. 80 tablet 11   No current facility-administered medications for this visit.     Allergies:   Patient has no known allergies.   Social History: Social History   Social History  . Marital status: Married    Spouse name: N/A  . Number of children: N/A  . Years of education: N/A   Occupational History  . Not on file.   Social History Main Topics  . Smoking status: Current Every Day Smoker    Types: Cigars, Cigarettes  . Smokeless tobacco: Former Neurosurgeon    Types: Snuff, Chew     Comment: 09/03/2016 "used smokeless tobacco in my teens; quit smoking cigarettes in the early 1980s; smokes pipe only"  . Alcohol use No  . Drug use: No  .  Sexual activity: Not on file   Other Topics Concern  . Not on file   Social History Narrative  . No narrative on file    Family History: Family History  Problem Relation Age of Onset  . Cancer Brother        esophageal    Review of Systems: All other systems reviewed and are otherwise negative except as noted above.   Physical Exam: VS:  BP 125/75   Pulse 66   Ht 5\' 6"  (1.676 m)   Wt 157 lb 3.2 oz (71.3 kg)   BMI 25.37 kg/m  , BMI Body mass index is 25.37 kg/m.  GEN- The patient is well appearing, alert and oriented x 3 today.   HEENT: normocephalic, atraumatic; sclera clear, conjunctiva pink; hearing intact; oropharynx clear; neck supple  Lungs- Clear to ausculation bilaterally, normal work of breathing.  No wheezes, rales, rhonchi Heart- Regular rate and rhythm (paced) GI- soft, non-tender, non-distended, bowel sounds present Extremities- no clubbing, cyanosis, or edema  MS- no significant deformity or atrophy Skin- warm and dry, no rash or lesion; ICD pocket well healed Psych- euthymic mood, full affect Neuro- strength and sensation are  intact  ICD interrogation- reviewed in detail today,  See PACEART report  EKG:  EKG is ordered today. The ekg ordered today shows sinus rhythm with V pacing   Recent Labs: 09/01/2016: B Natriuretic Peptide 1,502.7 09/03/2016: Magnesium 1.7 09/04/2016: Hemoglobin 12.4; Platelets 192 09/08/2016: ALT 30; TSH 6.87 10/27/2016: BUN 13; Creatinine, Ser 1.08; Potassium 4.9; Sodium 133   Wt Readings from Last 3 Encounters:  03/31/17 157 lb 3.2 oz (71.3 kg)  03/09/17 153 lb 12.8 oz (69.8 kg)  12/14/16 158 lb (71.7 kg)     Other studies Reviewed: Additional studies/ records that were reviewed today include: Dr Herbie Baltimore and Dr Lubertha Basque office notes  Assessment and Plan:  1.  Chronic systolic dysfunction euvolemic today Stable on an appropriate medical regimen Normal ICD function See Pace Art report EKG VV optimization done today. Final programming 150/100 LV first by (was 180/160, LV first by 0)  2.  Ventricular tachycardia No recurrence  Continue amiodarone. LFT's, TSH at next follow up. Annual eye exams recommended.    3.  CAD s/p CABG No ischemic symptoms Continue medical therapy  4.  HTN Stable No change required today   Current medicines are reviewed at length with the patient today.   The patient does not have concerns regarding his medicines.  The following changes were made today:  none  Labs/ tests ordered today include:   Orders Placed This Encounter  Procedures  . CUP PACEART INCLINIC DEVICE CHECK     Disposition:   Follow up with ICM clinic, Dr Ladona Ridgel as scheduled    Signed, Gypsy Balsam, NP 03/31/2017 11:23 AM  [facility] [address] [place] [address] [phone] (office) [phone] (fax)

## 2017-03-31 ENCOUNTER — Encounter: Payer: Self-pay | Admitting: Nurse Practitioner

## 2017-03-31 ENCOUNTER — Ambulatory Visit (INDEPENDENT_AMBULATORY_CARE_PROVIDER_SITE_OTHER): Payer: Medicare Other | Admitting: Nurse Practitioner

## 2017-03-31 VITALS — BP 125/75 | HR 66 | Ht 66.0 in | Wt 157.2 lb

## 2017-03-31 DIAGNOSIS — I472 Ventricular tachycardia, unspecified: Secondary | ICD-10-CM

## 2017-03-31 DIAGNOSIS — I5042 Chronic combined systolic (congestive) and diastolic (congestive) heart failure: Secondary | ICD-10-CM | POA: Diagnosis not present

## 2017-03-31 DIAGNOSIS — I10 Essential (primary) hypertension: Secondary | ICD-10-CM | POA: Diagnosis not present

## 2017-03-31 DIAGNOSIS — I200 Unstable angina: Secondary | ICD-10-CM

## 2017-03-31 DIAGNOSIS — I25119 Atherosclerotic heart disease of native coronary artery with unspecified angina pectoris: Secondary | ICD-10-CM | POA: Diagnosis not present

## 2017-03-31 DIAGNOSIS — I209 Angina pectoris, unspecified: Secondary | ICD-10-CM

## 2017-03-31 LAB — CUP PACEART INCLINIC DEVICE CHECK
Implantable Lead Implant Date: 20171215
Implantable Lead Implant Date: 20171215
Implantable Lead Location: 753859
Implantable Lead Location: 753860
MDC IDC LEAD IMPLANT DT: 20171215
MDC IDC LEAD LOCATION: 753858
MDC IDC PG IMPLANT DT: 20171215
MDC IDC SESS DTM: 20180712093853
Pulse Gen Serial Number: 7368976

## 2017-03-31 NOTE — Patient Instructions (Signed)
Medication Instructions:  Your physician recommends that you continue on your current medications as directed. Please refer to the Current Medication list given to you today.   Labwork: None Ordered   Testing/Procedures: None Ordered   Follow-Up: Remote monitoring is used to monitor your Pacemaker of ICD from home. This monitoring reduces the number of office visits required to check your device to one time per year. It allows [place] to keep an eye on the functioning of your device to ensure it is working properly. You are scheduled for a device check from home on 06/30/17. You may send your transmission at any time that day. If you have a wireless device, the transmission will be sent automatically. After your physician reviews your transmission, you will receive a postcard with your next transmission date.  Your physician wants you to follow-up in: 9 months with Dr. Ladona Ridgel. You will receive a reminder letter in the mail two months in advance. If you don't receive a letter, please call our office to schedule the follow-up appointment.   Any Other Special Instructions Will Be Listed Below (If Applicable).     If you need a refill on your cardiac medications before your next appointment, please call your pharmacy.

## 2017-05-02 ENCOUNTER — Ambulatory Visit (INDEPENDENT_AMBULATORY_CARE_PROVIDER_SITE_OTHER): Payer: Medicare Other

## 2017-05-02 DIAGNOSIS — I5042 Chronic combined systolic (congestive) and diastolic (congestive) heart failure: Secondary | ICD-10-CM

## 2017-05-02 DIAGNOSIS — Z95810 Presence of automatic (implantable) cardiac defibrillator: Secondary | ICD-10-CM | POA: Diagnosis not present

## 2017-05-02 NOTE — Progress Notes (Signed)
EPIC Encounter for ICM Monitoring  Patient Name: Tritan Riegsecker is a 71 y.o. male Date: 05/02/2017 Primary Care Physican: Charolett Bumpers, PA-C Primary Cardiologist: Herbie Baltimore  Electrophysiologist: Ladona Ridgel Dry Weight:151lbs  Bi-V Pacing: 97%       Heart Failure questions reviewed, pt asymptomatic.  Denied any symptoms during decreased impedance.  He reported being very strict on salt intake and rarely has more than  1500 mg a day   Thoracic impedance normal but was abnormal suggesting fluid accumulation 6/21 to 7/14 with exception of few days at baseline and 7/17 to 7/26.  Prescribed dosage: Furosemide 40 mg 1 tablet daily. Per Dr Herbie Baltimore note 03/09/2017, patient can take additional Lasix if needed for fluid symptoms.   Labs: 10/27/2016 Creatinine 1.08, BUN 13, Potassium 4.9, Sodium 133 09/08/2016 Creatinine 1.19, BUN 11, Potassium 5.0, Sodium 135 09/03/2016 Creatinine 1.36, BUN 17, Potassium 3.9, Sodium 128 09/02/2016 Creatinine 1.13, BUN 14, Potassium 3.9, Sodium 134 09/01/2017 Creatinine 1.30, BUN 15, Potassium 5.3, Sodium 133  Recommendations: No changes.  Encouraged to call for fluid symptoms.  Follow-up plan: ICM clinic phone appointment on 06/07/2017.    Copy of ICM check sent to device physician.   3 month ICM trend: 05/02/2017   1 Year ICM trend:      Karie Soda, RN 05/02/2017 8:04 AM

## 2017-06-07 ENCOUNTER — Ambulatory Visit (INDEPENDENT_AMBULATORY_CARE_PROVIDER_SITE_OTHER): Payer: Medicare Other

## 2017-06-07 DIAGNOSIS — Z95810 Presence of automatic (implantable) cardiac defibrillator: Secondary | ICD-10-CM

## 2017-06-07 DIAGNOSIS — I5042 Chronic combined systolic (congestive) and diastolic (congestive) heart failure: Secondary | ICD-10-CM | POA: Diagnosis not present

## 2017-06-07 NOTE — Progress Notes (Signed)
EPIC Encounter for ICM Monitoring  Patient Name: [name] [name] is a 71 y.o. male Date: 06/07/2017 Primary Care Physican: Charolett Bumpers, PA-C Primary Cardiologist: Herbie Baltimore  Electrophysiologist: Ladona Ridgel Dry Weight:150.4lbs  Bi-V Pacing: 97%      Heart Failure questions reviewed, pt asymptomatic.   Thoracic impedance above baseline suggesting dryness since 06/05/17.    Prescribed dosage: Furosemide 40 mg 1 tablet daily. Per Dr Herbie Baltimore note 03/09/2017, patient can take additional Lasix if needed for fluid symptoms.   Labs: 10/27/2016 Creatinine 1.08, BUN 13, Potassium 4.9, Sodium 133 09/08/2016 Creatinine 1.19, BUN 11, Potassium 5.0, Sodium 135 09/03/2016 Creatinine 1.36, BUN 17, Potassium 3.9, Sodium 128 09/02/2016 Creatinine 1.13, BUN 14, Potassium 3.9, Sodium 134 09/01/2017 Creatinine 1.30, BUN 15, Potassium 5.3, Sodium 133  Recommendations: No changes.   Encouraged to call for fluid symptoms.  Follow-up plan: ICM clinic phone appointment on 07/11/2017.    Copy of ICM check sent to Dr. Ladona Ridgel.   3 month ICM trend: 06/07/2017   1 Year ICM trend:      Karie Soda, RN 06/07/2017 10:24 AM

## 2017-07-11 ENCOUNTER — Ambulatory Visit (INDEPENDENT_AMBULATORY_CARE_PROVIDER_SITE_OTHER): Payer: Medicare Other | Admitting: *Deleted

## 2017-07-11 DIAGNOSIS — I472 Ventricular tachycardia, unspecified: Secondary | ICD-10-CM

## 2017-07-11 DIAGNOSIS — I5042 Chronic combined systolic (congestive) and diastolic (congestive) heart failure: Secondary | ICD-10-CM | POA: Diagnosis not present

## 2017-07-11 DIAGNOSIS — Z95810 Presence of automatic (implantable) cardiac defibrillator: Secondary | ICD-10-CM

## 2017-07-11 NOTE — Progress Notes (Signed)
Remote ICD transmission.   

## 2017-07-12 NOTE — Progress Notes (Signed)
EPIC Encounter for ICM Monitoring  Patient Name: [name] [name] is a 71 y.o. male Date: 07/12/2017 Primary Care Physican: Charolett Bumpers, PA-C Primary Cardiologist: Herbie Baltimore  Electrophysiologist: Ladona Ridgel Dry Weight:151.2lbs  Bi-V Pacing: 98%      Heart Failure questions reviewed, pt asymptomatic.   Thoracic impedance  abnormal suggesting fluid accumulation since 07/08/2017.  Impedance abnormal also from 06/11/2017 to 06/19/2017 and 07/02/2017 to 07/06/2017.  Prescribed dosage: Furosemide 40 mg 1 tablet daily. Per Dr Herbie Baltimore note 03/09/2017, patient can take additional Lasix if needed for fluid symptoms.   Labs: 10/27/2016 Creatinine 1.08, BUN 13, Potassium 4.9, Sodium 133 09/08/2016 Creatinine 1.19, BUN 11, Potassium 5.0, Sodium 135 09/03/2016 Creatinine 1.36, BUN 17, Potassium 3.9, Sodium 128 09/02/2016 Creatinine 1.13, BUN 14, Potassium 3.9, Sodium 134 09/01/2017 Creatinine 1.30, BUN 15, Potassium 5.3, Sodium 133  Recommendations:  Advised to take Furosemide 40 mg 1 tablet every AM and 1 tablet every PM x 2 days and then return to 1 tablet every morning.   Follow-up plan: ICM clinic phone appointment on 07/18/2017 (manual send) to recheck fluid levels.  Office appointment scheduled 09/14/2017 with Dr. Ladona Ridgel.  Copy of ICM check sent to Dr. Ladona Ridgel and Dr. Herbie Baltimore.   3 month ICM trend: 07/11/2017   1 Year ICM trend:      Karie Soda, RN 07/12/2017 2:32 PM

## 2017-07-13 LAB — CUP PACEART REMOTE DEVICE CHECK
Battery Remaining Longevity: 75 mo
Battery Remaining Percentage: 84 %
Brady Statistic AS VS Percent: 1 %
Date Time Interrogation Session: 20181022060015
HIGH POWER IMPEDANCE MEASURED VALUE: 63 Ohm
HIGH POWER IMPEDANCE MEASURED VALUE: 63 Ohm
Implantable Lead Implant Date: 20171215
Implantable Lead Implant Date: 20171215
Implantable Lead Implant Date: 20171215
Implantable Lead Location: 753859
Implantable Lead Model: 7122
Lead Channel Impedance Value: 610 Ohm
Lead Channel Pacing Threshold Amplitude: 0.5 V
Lead Channel Pacing Threshold Pulse Width: 0.5 ms
Lead Channel Pacing Threshold Pulse Width: 0.5 ms
Lead Channel Sensing Intrinsic Amplitude: 3.4 mV
Lead Channel Setting Pacing Amplitude: 2 V
Lead Channel Setting Pacing Amplitude: 2 V
Lead Channel Setting Pacing Pulse Width: 0.5 ms
MDC IDC LEAD LOCATION: 753858
MDC IDC LEAD LOCATION: 753860
MDC IDC MSMT BATTERY VOLTAGE: 3.01 V
MDC IDC MSMT LEADCHNL LV IMPEDANCE VALUE: 400 Ohm
MDC IDC MSMT LEADCHNL LV PACING THRESHOLD AMPLITUDE: 1 V
MDC IDC MSMT LEADCHNL RA IMPEDANCE VALUE: 430 Ohm
MDC IDC MSMT LEADCHNL RV PACING THRESHOLD AMPLITUDE: 0.5 V
MDC IDC MSMT LEADCHNL RV PACING THRESHOLD PULSEWIDTH: 0.5 ms
MDC IDC MSMT LEADCHNL RV SENSING INTR AMPL: 7.6 mV
MDC IDC PG IMPLANT DT: 20171215
MDC IDC PG SERIAL: 7368976
MDC IDC SET LEADCHNL RA PACING AMPLITUDE: 2 V
MDC IDC SET LEADCHNL RV PACING PULSEWIDTH: 0.5 ms
MDC IDC SET LEADCHNL RV SENSING SENSITIVITY: 0.5 mV
MDC IDC STAT BRADY AP VP PERCENT: 1 %
MDC IDC STAT BRADY AP VS PERCENT: 1 %
MDC IDC STAT BRADY AS VP PERCENT: 97 %
MDC IDC STAT BRADY RA PERCENT PACED: 1 %

## 2017-07-15 ENCOUNTER — Encounter: Payer: Self-pay | Admitting: Cardiology

## 2017-07-18 ENCOUNTER — Telehealth: Payer: Self-pay

## 2017-07-18 ENCOUNTER — Ambulatory Visit (INDEPENDENT_AMBULATORY_CARE_PROVIDER_SITE_OTHER): Payer: Self-pay

## 2017-07-18 DIAGNOSIS — I5022 Chronic systolic (congestive) heart failure: Secondary | ICD-10-CM

## 2017-07-18 DIAGNOSIS — Z95810 Presence of automatic (implantable) cardiac defibrillator: Secondary | ICD-10-CM

## 2017-07-18 NOTE — Telephone Encounter (Signed)
Spoke with pt and reminded pt of remote transmission that is due today. Pt verbalized understanding.   

## 2017-07-18 NOTE — Progress Notes (Signed)
EPIC Encounter for ICM Monitoring  Patient Name: [name] [name] is a 71 y.o. male Date: 07/18/2017 Primary Care Physican: Charolett Bumpers, PA-C Primary Cardiologist: Herbie Baltimore  Electrophysiologist: Ladona Ridgel Dry Weight:149lbs  Bi-V Pacing: 98%       Heart Failure questions reviewed, pt asymptomatic.   Thoracic impedance returned to normal after taking 2 days of extra Furosemide.   Prescribed dosage: Furosemide 40 mg 1 tablet daily. Per Dr Herbie Baltimore note 03/09/2017, patient can take additional Lasix if needed for fluid symptoms.   Labs: 10/27/2016 Creatinine 1.08, BUN 13, Potassium 4.9, Sodium 133 09/08/2016 Creatinine 1.19, BUN 11, Potassium 5.0, Sodium 135 09/03/2016 Creatinine 1.36, BUN 17, Potassium 3.9, Sodium 128 09/02/2016 Creatinine 1.13, BUN 14, Potassium 3.9, Sodium 134 09/01/2017 Creatinine 1.30, BUN 15, Potassium 5.3, Sodium 133  Recommendations: No changes.   Encouraged to call for fluid symptoms.  Follow-up plan: ICM clinic phone appointment on 08/15/2017.  Office appointment scheduled 09/14/2017 with Dr. Ladona Ridgel.  Copy of ICM check sent to Dr. Ladona Ridgel.   3 month ICM trend: 07/18/2017   1 Year ICM trend:      Karie Soda, RN 07/18/2017 4:08 PM

## 2017-08-15 ENCOUNTER — Ambulatory Visit (INDEPENDENT_AMBULATORY_CARE_PROVIDER_SITE_OTHER): Payer: Medicare Other

## 2017-08-15 DIAGNOSIS — I5022 Chronic systolic (congestive) heart failure: Secondary | ICD-10-CM | POA: Diagnosis not present

## 2017-08-15 DIAGNOSIS — Z95810 Presence of automatic (implantable) cardiac defibrillator: Secondary | ICD-10-CM | POA: Diagnosis not present

## 2017-08-15 NOTE — Progress Notes (Signed)
EPIC Encounter for ICM Monitoring  Patient Name: [name] [name] is a 71 y.o. male Date: 08/15/2017 Primary Care Physican: Charolett Bumpers, PA-C Primary Cardiologist: Herbie Baltimore  Electrophysiologist: Ladona Ridgel Dry Weight: 153lbs  Bi-V Pacing: 98%      Heart Failure questions reviewed, pt asymptomatic.   Thoracic impedance normal but was abnormal suggesting fluid accumulation from 08/03/2017 to 08/12/2017.  Prescribed dosage: Furosemide 40 mg 1 tablet daily. Per Dr Herbie Baltimore note 03/09/2017, patient can take additional Lasix if needed for fluid symptoms.   Labs: 10/27/2016 Creatinine 1.08, BUN 13, Potassium 4.9, Sodium 133 09/08/2016 Creatinine 1.19, BUN 11, Potassium 5.0, Sodium 135 09/03/2016 Creatinine 1.36, BUN 17, Potassium 3.9, Sodium 128 09/02/2016 Creatinine 1.13, BUN 14, Potassium 3.9, Sodium 134 09/01/2017 Creatinine 1.30, BUN 15, Potassium 5.3, Sodium 133  Recommendations: No changes.  Encouraged to call for fluid symptoms.  Follow-up plan: ICM clinic phone appointment on 10/17/2017.  Office appointment scheduled 09/05/2017 with Dr Herbie Baltimore and 09/14/2017 with Dr. Ladona Ridgel.  Copy of ICM check sent to Dr. Ladona Ridgel.   3 month ICM trend: 08/15/2017    1 Year ICM trend:       Karie Soda, RN 08/15/2017 11:04 AM

## 2017-08-19 ENCOUNTER — Telehealth: Payer: Self-pay | Admitting: Cardiology

## 2017-08-19 MED ORDER — LOSARTAN POTASSIUM 25 MG PO TABS: 25.0000 mg | ORAL_TABLET | Freq: Every day | ORAL | 3 refills | Status: DC

## 2017-08-19 NOTE — Telephone Encounter (Signed)
New message  Ptdaughter verbalized that she is calling for the rn  She has some questions

## 2017-08-19 NOTE — Telephone Encounter (Signed)
Returned call to [name], pt daughter/DPR contact.  States pt is on valsartan and it's been recalled, she's needing to know if a new med can be prescribed prior to his OV on 12/17 - aware I will route msg to pharmD, obtain recommendations, and f/u w her.

## 2017-08-19 NOTE — Telephone Encounter (Signed)
Switch patient to losartan 25 mg once daily.  Can then determine benefit on Dec 17 appt.

## 2017-08-19 NOTE — Telephone Encounter (Signed)
Spoke to [name], advised of recommendations, losartan 25mg  daily called to local pharmacy. She's aware pt can obtain printed prescription to take to [facility] when needed, she will make sure he gets this at his next appt. I have added a comment to his appt notes.

## 2017-08-26 ENCOUNTER — Encounter: Payer: Self-pay | Admitting: Internal Medicine

## 2017-09-05 ENCOUNTER — Encounter: Payer: Self-pay | Admitting: Cardiology

## 2017-09-05 ENCOUNTER — Ambulatory Visit (INDEPENDENT_AMBULATORY_CARE_PROVIDER_SITE_OTHER): Payer: Medicare Other | Admitting: Cardiology

## 2017-09-05 VITALS — BP 118/68 | HR 74 | Ht 66.0 in | Wt 155.0 lb

## 2017-09-05 DIAGNOSIS — I209 Angina pectoris, unspecified: Secondary | ICD-10-CM

## 2017-09-05 DIAGNOSIS — F17200 Nicotine dependence, unspecified, uncomplicated: Secondary | ICD-10-CM

## 2017-09-05 DIAGNOSIS — E1169 Type 2 diabetes mellitus with other specified complication: Secondary | ICD-10-CM

## 2017-09-05 DIAGNOSIS — I420 Dilated cardiomyopathy: Secondary | ICD-10-CM

## 2017-09-05 DIAGNOSIS — I25118 Atherosclerotic heart disease of native coronary artery with other forms of angina pectoris: Secondary | ICD-10-CM

## 2017-09-05 DIAGNOSIS — I208 Other forms of angina pectoris: Secondary | ICD-10-CM

## 2017-09-05 DIAGNOSIS — M6289 Other specified disorders of muscle: Secondary | ICD-10-CM | POA: Diagnosis not present

## 2017-09-05 DIAGNOSIS — Z95828 Presence of other vascular implants and grafts: Secondary | ICD-10-CM | POA: Diagnosis not present

## 2017-09-05 DIAGNOSIS — I10 Essential (primary) hypertension: Secondary | ICD-10-CM

## 2017-09-05 DIAGNOSIS — E785 Hyperlipidemia, unspecified: Secondary | ICD-10-CM | POA: Diagnosis not present

## 2017-09-05 DIAGNOSIS — I5042 Chronic combined systolic (congestive) and diastolic (congestive) heart failure: Secondary | ICD-10-CM

## 2017-09-05 DIAGNOSIS — I739 Peripheral vascular disease, unspecified: Secondary | ICD-10-CM

## 2017-09-05 DIAGNOSIS — I200 Unstable angina: Secondary | ICD-10-CM

## 2017-09-05 NOTE — Progress Notes (Addendum)
PCP: [name] Bumpers, PA-C  Clinic Note: Chief Complaint  Patient presents with  . Follow-up    6 months  . Shortness of Breath    At baseline  . Coronary Artery Disease  . Cardiomyopathy    Ischemic, history of sustained ventricular tachycardia, status post ICD    HPI: [name] [name] is a 71 y.o. male with a PMH below who presents today for 61month f/u for ICM, CAD --initial presentation was sustained ventricular tachycardia with resultant ICD placement in December 2017.  Known CAD- (s/p 3 V CABG) --> diagnosed after his AoBiFem Bypass in Feb 2014 complicated by ischemic VT post-operatively, NEVER had any symptoms of Angina --> Cath (this was @ [facility] --> relook Cath in July 2014.   Post-op CABG was complicated by Sternal Wound Infxn --> redo-sternotomy with Steel-Plate insertion (on lifelong Dicloxacillin).  After his CABG, he was lost to Cardiology F/u (by [facility]).   December 2017: His presenting symptoms were more consistent with heart failure exacerbation and possible unstable angina.   Echocardiogram revealed EF of roughly 20% & he had sustained VT --> CATH with no no culprit lesion.  Status post ICD placement by Dr. Ladona [name] December 2017  [name] [name] was last seen in June -he was doing fairly well.  We had tried to start him on Entresto, but this was not covered by the [place] (despite it being guideline directed)  Recent Hospitalizations: None  Studies Personally Reviewed - (if available, images/films reviewed: From Epic Chart or Care Everywhere)    Interval History: Branch returns today still remarkably well given his reduced EF.  He says that occasionally he feels a little bit tired and weak if he has been standing for long period time, but otherwise he says he is able to walk and do what he wants to go about a half a mile and his legs just "give out "they feel tired.  This is what caused him to stop  walking much more than any dyspnea, and he has no chest tightness or pressure.  He says he probably goes a little slower than he used to, but otherwise is doing fairly well.  He likes to go out to [place] or [employer] and other places to walk around and get exercise.  Prefers to do this and going outside.  He likes to have things to hold onto and uses a cane to walk because he has some balance issues.  Is been having a lot of neck difficulties.  Remainder of cardiac review of symptoms: No PND, orthopnea with minimal edema.  No palpitations, lightheadedness, dizziness, weakness or syncope/near syncope.  Just poor balance. No TIA/amaurosis fugax symptoms. No limiting claudication.  He is still closely think attention to his salt intake and adjust his Lasix to his weight.  Has not really had to use any additional doses.  He says that he was contacted by his ICD monitoring team on 1 or 2 occasions since I last saw him indicating that his impedance levels had gone down.  They recommended that he take extra dose of Lasix.  That is probably the only time he is taking extra Lasix that he remembers over the last 2 months.  No  ROS: A comprehensive was performed. Review of Systems  Constitutional: Negative for malaise/fatigue.  HENT: Negative for congestion and sinus pain.   Respiratory: Negative for shortness of breath.   Cardiovascular:  Per history of present illness  Gastrointestinal: Negative for constipation, heartburn, nausea and vomiting.  Genitourinary: Negative for dysuria and urgency.  Musculoskeletal: Positive for joint pain (Normal arthritis pains).  Neurological: Positive for weakness (Leg fatigue and weakness with prolonged walking). Negative for dizziness.  Endo/Heme/Allergies: Negative for environmental allergies.  Psychiatric/Behavioral: Negative.   All other systems reviewed and are negative.   I have reviewed and (if needed) personally updated the patient's problem list,  medications, allergies, past medical and surgical history, social and family history.   Past Medical History:  Diagnosis Date  . Acid reflux   . AICD (automatic cardioverter/defibrillator) present    a. 08/2016 St. Jude (serial Number [id number]) biventricular ICD.  [name] Chronic systolic CHF (congestive heart failure) (HCC)    a. 08/2016 Echo: EF 20%, diffuse HK, inf, post AK, mod-sev MR, sev dil LA, mod TR, PASP .  [name] Complication of anesthesia    "was told I responded well to anesthesia"  . Coronary artery disease involving native coronary artery of native heart with angina pectoris (HCC)    a. 10/2012 MI after aortobifem bypass, found to have multivessel CAD -->CABG x3;  b. 08/2016 Cath: LM 60, LAD 52m, LCX 60ost/p, RCA 100p, VG->RPDA 50p, VG->OM1 ok, LIMA->LAD ok, EF 25%.  . Essential hypertension 03/03/2012  . History of blood transfusion 11/2012   "during his CABG"  . History of gout X 1  . History of stomach ulcers 1970s  . Hyperlipidemia associated with type 2 diabetes mellitus (HCC) 09/16/2011  . Ischemic cardiomyopathy    a. 08/2016 Echo: EF 20%, diffuse HK, inf, post AK;  b. 08/2016 St. Jude (serial Number [id number]) biventricular ICD.  [name] PAD (peripheral artery disease) (HCC)    s/p aortobifemoral bypass 10/2012  . PTSD (post-traumatic stress disorder)   . Type II diabetes mellitus (HCC)   . Ventricular tachycardia (HCC)    a. 08/2016 St. Jude (serial Number [id number]) biventricular ICD-->oral amio added.  . 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 Is now on chronic suppressive antibiotics with dicloxacillin    Past Surgical History:  Procedure Laterality Date  . AORTO-FEMORAL BYPASS GRAFT Bilateral 10/2012   [facility]  . BI-VENTRICULAR IMPLANTABLE CARDIOVERTER DEFIBRILLATOR  (CRT-D)  09/03/2016  . CARDIAC CATHETERIZATION  11/2012   [facility] (? for  abnormal Nuclear ST) --> no PCI, by report, patent grafts.  [name] CARDIAC CATHETERIZATION N/A 09/02/2016   Procedure: Left Heart Cath and Cors/Grafts Angiography;  Surgeon: Kathleene Hazel, MD;  Location: [facility] INVASIVE CV LAB;  Service: Cardiovascular;  Laterality: N/A;  . CARDIAC CATHETERIZATION  10/2012; 03/2013  . CHOLECYSTECTOMY  03/05/2012   Procedure: LAPAROSCOPIC CHOLECYSTECTOMY WITH INTRAOPERATIVE CHOLANGIOGRAM;  Surgeon: Emelia Loron, MD;  Location: [facility] OR;  Service: General;  Laterality: N/A;  . CORONARY ARTERY BYPASS GRAFT  11/2012   CABG X 3;  [facility]  . EP IMPLANTABLE DEVICE N/A 09/03/2016   Procedure: BiV ICD Insertion CRT-D;  Surgeon: Marinus Maw, MD;  Location: [facility] INVASIVE CV LAB;  Service: Cardiovascular;  Laterality: N/A;  . INGUINAL HERNIA REPAIR Left 1990s  . STERNOTOMY  2015   re-do sternotomy with metal place "sheild" placed. -Has chronic staph infection, on chronic suppressive medication  . TRANSTHORACIC ECHOCARDIOGRAM  02/28/2017   EF remains 20p-25% severely reduced function. Diffuse hypokinesis. "Grade 1 diastolic dysfunction ". Severely calcified aortic valve with restricted mobility (functioning bicuspid) however no  suggestion of stenosis.  [name] UMBILICAL HERNIA REPAIR  03/05/2012   Procedure: HERNIA REPAIR UMBILICAL ADULT;  Surgeon: Emelia Loron, MD;  Location: [facility] OR;  Service: General;  Laterality: N/A;   Cath 08/2016.     Current Meds  Medication Sig  . albuterol (PROVENTIL HFA) 108 (90 Base) MCG/ACT inhaler Inhale 1-2 puffs into the lungs every 6 (six) hours as needed for wheezing or shortness of breath.  [name] amiodarone (PACERONE) 200 MG tablet 2 tabs twice daily x 1 week, then 1 tab twice daily x 2 wks, then 1 tab daily.  [name] aspirin EC 81 MG tablet Take 81 mg by mouth daily.    [name] atorvastatin (LIPITOR) 80 MG tablet Take 40 mg by mouth at bedtime.  . carvedilol (COREG) 6.25 MG tablet Take 1 tablet (6.25 mg total) by mouth 2 (two) times daily with a  meal.  . Cholecalciferol (VITAMIN D-3 PO) Take 2,000 mg by mouth daily.   . dicloxacillin (DYNAPEN) 250 MG capsule Take 500 mg by mouth 3 (three) times daily.  . fluticasone (FLONASE ALLERGY RELIEF) 50 MCG/ACT nasal spray Place 2 sprays into both nostrils daily as needed for allergies or rhinitis.  . furosemide (LASIX) 40 MG tablet Take 1 tablet (40 mg total) by mouth daily.  [name] glipiZIDE (GLUCOTROL) 5 MG tablet Take 2.5 mg by mouth daily before breakfast.  . losartan (COZAAR) 25 MG tablet Take 1 tablet (25 mg total) by mouth daily.  . magnesium oxide (MAG-OX) 400 (241.3 Mg) MG tablet Take 1 tablet (400 mg total) by mouth daily.  . metFORMIN (GLUCOPHAGE) 1000 MG tablet Take 1 tablet (1,000 mg total) by mouth 2 (two) times daily with a meal. Resume on 09/06/2016  . nitroGLYCERIN (NITROSTAT) 0.4 MG SL tablet Place 1 tablet (0.4 mg total) under the tongue every 5 (five) minutes x 3 doses as needed for chest pain.  [name] omeprazole (PRILOSEC) 20 MG capsule Take 20 mg by mouth daily.  . ranitidine (ZANTAC) 150 MG tablet Take 150 mg by mouth daily as needed for heartburn.   . Tiotropium Bromide Monohydrate (SPIRIVA HANDIHALER IN) Inhale 18 mcg into the lungs daily.    No Known Allergies  Social History   Socioeconomic History  . Marital status: Married    Spouse name: None  . Number of children: None  . Years of education: None  . Highest education level: None  Social Needs  . Financial resource strain: None  . Food insecurity - worry: None  . Food insecurity - inability: None  . Transportation needs - medical: None  . Transportation needs - non-medical: None  Occupational History  . None  Tobacco Use  . Smoking status: Current Every Day Smoker    Types: Cigars, Cigarettes  . Smokeless tobacco: Former Neurosurgeon    Types: Snuff, Chew  . Tobacco comment: 09/03/2016 "used smokeless tobacco in my teens; quit smoking cigarettes in the early 1980s; smokes pipe only"  Substance and Sexual Activity    . Alcohol use: No  . Drug use: No  . Sexual activity: None  Other Topics Concern  . None  Social History Narrative  . None  --He says he smokes a pipe, but this mostly consist of lighting up the pipe and smelling the back of burn.  He barely inhales once or twice just to get it going now.  family history includes Cancer in his brother.  Wt Readings from Last 3 Encounters:  09/05/17 155 lb (70.3 kg)  03/31/17 157 lb  3.2 oz (71.3 kg)  03/09/17 153 lb 12.8 oz (69.8 kg)    PHYSICAL EXAM BP 118/68   Pulse 74   Ht  (1.676 m)   Wt 155 lb (70.3 kg)   BMI 25.02 kg/m   Physical Exam  Constitutional: He is oriented to person, place, and time. He appears well-developed and well-nourished. No distress.  HENT:  Head: Normocephalic and atraumatic.  Mouth/Throat: No oropharyngeal exudate.  Very poor dentition  Eyes: EOM are normal.  Neck: No hepatojugular reflux and no JVD present. Carotid bruit is not present.  Cardiovascular: Normal rate, regular rhythm and intact distal pulses. PMI is not displaced. Exam reveals gallop (Soft S4). Exam reveals no friction rub.  No murmur heard. Pulmonary/Chest: Effort normal and breath sounds normal. No respiratory distress. He has no wheezes.  Left midlung field has some mild rhonchi that clears with cough  Abdominal: Soft. Bowel sounds are normal. He exhibits no distension. There is no tenderness. There is no rebound.  Musculoskeletal: Normal range of motion. He exhibits edema (Trivial.).  Neurological: He is alert and oriented to person, place, and time.  Skin: Skin is warm and dry. No rash noted. No erythema.  Psychiatric: His behavior is normal. Judgment and thought content normal.  Nursing note and vitals reviewed.    Adult ECG Report N/a  Other studies Reviewed: Additional studies/ records that were reviewed today include:  Recent Labs:  Labs [facility] by [facility] -they were just checked recently, but not available.  Lab Results   Component Value Date   CHOL 87 09/02/2016   HDL 26 (L) 09/02/2016   LDLCALC 43 09/02/2016   TRIG 92 09/02/2016   CHOLHDL 3.3 09/02/2016   Lab Results  Component Value Date   CREATININE 1.08 10/27/2016   BUN 13 10/27/2016   NA 133 (L) 10/27/2016   K 4.9 10/27/2016   CL 94 (L) 10/27/2016   CO2 26 10/27/2016     ASSESSMENT / PLAN: Problem List Items Addressed This Visit    Atypical angina (HCC)    Unfortunately, he really did not have anginal symptoms to speak of, was mostly heart failure.  He is not having any symptoms now.  We will simply need to monitor for unusual symptoms that are more heart failure as opposed to heart failure symptoms.      Chronic combined systolic and diastolic CHF, NYHA class 2 and ACA/AHA stage C (HCC) (Chronic)    Doing well with stable heart failure symptoms.  Relatively euvolemic. It seems like his weight checks are correlating well with his thoracic impedance.   Discussed sliding scale Lasix. Continue current dose of carvedilol and losartan. Unfortunately, he cannot be on Entresto because it is not covered by the [place]      Relevant Orders   [facility]AS [place] AAA DUPLEX   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, but Also Potentially Arrhythmogenic (Chronic)     Unfortunately, his EF did not improve with medication adjustments and maintaining sinus rhythm with amiodarone.  This would suggest long-standing ischemic cardiomyopathy from his original CAD diagnosis. Euvolemic on exam.  On stable dose of Lasix.  We talked about sliding scale. I did suggest that he stay on the 25 mg losartan for now, we can consider adjusting in the future.  He had been switched from valsartan to losartan (originally intended to be 50 mg, but he was only taking 25.).      Relevant Orders   VAS [place] AAA DUPLEX  Coronary artery disease involving native coronary artery with other form of angina pectoris, unspecified whether native or transplanted heart (HCC) - Primary  (Chronic)    Interestingly, despite having had multivessel disease leading to CABG and then sustained ventricular tachycardia with heart failure, he denied any anginal symptoms.  His major issue has been heart failure. The catheterization showed widely patent grafts, but severe native CAD which probably went unchecked for years..  However, his EF by echo was 20-25%.   In the absence of angina, would not pursue any ischemic evaluation at this time. He is on stable dose carvedilol and furosemide.  He was not sure what dose of losartan he was supposed to take, and therefore has been taking 25 mg.  I would continue that for now. He is on high-dose atorvastatin along with aspirin, but no Plavix.      Essential hypertension (Chronic)    Blood pressure looks great on current dose of losartan and carvedilol.  There was a question of whether he should be on 50 mg versus 25 mg losartan.  Based on his current blood pressure I would stay at 25 mg for now.      Exercise-induced leg fatigue    This may be simply musculoskeletal, however with a history of PAD and aortobifem bypass, it is certainly well within reason to check aortic and lower extremity arterial Dopplers.      Relevant Orders   VAS [place] LOWER EXTREMITY ARTERIAL DUPLEX   VAS [place] AAA DUPLEX   Hyperlipidemia associated with type 2 diabetes mellitus (HCC) (Chronic)    Last year his lipids look great.  He remains on high-dose atorvastatin.  I am not aware with the most recent check from the [place] was, but there is posterior been done in the interim since his last visit.  I would like to see what the levels are in order to determine whether or not we can reduce his atorvastatin dose.      PAD (peripheral artery disease) (HCC) (Chronic)    Status post aortobifem bypass back in 2014.  That is what started the whole process.  He does get fatigued and tired with walking.  It is not limiting however. He has not had any evaluation that he could not think of  in the last couple years to evaluate his aortobifem graft. Plan: We will order abdominal aortic and lower extremity arterial Dopplers.      Relevant Orders   VAS [place] LOWER EXTREMITY ARTERIAL DUPLEX   VAS [place] AAA DUPLEX   Status post aortobifemoral bypass surgery (Chronic)   Relevant Orders   VAS [place] LOWER EXTREMITY ARTERIAL DUPLEX   VAS [place] AAA DUPLEX   Tobacco use disorder (Chronic)    Smokes Pipe.  :"not a whole lot".  At this point, he uses the pipe as a crutch, but probably would not be considered to be a "smoker"         Current medicines are reviewed at length with the patient today. (+/- concerns) n/a The following changes have been made: n/a  Patient Instructions  MEDICATION  INSTRUCTIONS   OKAY TO STAY ON LOSARTAN 25 MG DAILY   TESTING  SCHEDULE AT [address] Your physician has requested that you have an abdominal aorta duplex. During this test, an ultrasound is used to evaluate the aorta. Allow 30 minutes for this exam. Do not eat after midnight the day before and avoid carbonated beverages AND Your physician has requested that you  have a lower extremity arterial duplex. This test is an ultrasound of the arteries in the legs. It looks at arterial blood flow in the legs. Allow one hour for Lower Arterial scans. There are no restrictions or special instructions     Your physician wants you to follow-up in 6 month with DR Herbie Baltimore. You will receive a reminder letter in the mail two months in advance. If you don't receive a letter, please call our office to schedule the follow-up appointment.   If you need a refill on your cardiac medications before your next appointment, please call your pharmacy.    Studies Ordered:   No orders of the defined types were placed in this encounter.     Bryan Lemma, M.D., M.S. Interventional Cardiologist   Pager # [phone] Phone # [phone] [address] [place], [place] [address]

## 2017-09-05 NOTE — Patient Instructions (Signed)
MEDICATION  INSTRUCTIONS   OKAY TO STAY ON LOSARTAN 25 MG DAILY   TESTING  SCHEDULE AT [address] Your physician has requested that you have an abdominal aorta duplex. During this test, an ultrasound is used to evaluate the aorta. Allow 30 minutes for this exam. Do not eat after midnight the day before and avoid carbonated beverages AND Your physician has requested that you have a lower extremity arterial duplex. This test is an ultrasound of the arteries in the legs. It looks at arterial blood flow in the legs. Allow one hour for Lower Arterial scans. There are no restrictions or special instructions     Your physician wants you to follow-up in 6 month with DR Herbie Baltimore. You will receive a reminder letter in the mail two months in advance. If you don't receive a letter, please call our office to schedule the follow-up appointment.   If you need a refill on your cardiac medications before your next appointment, please call your pharmacy.

## 2017-09-05 NOTE — Assessment & Plan Note (Addendum)
Smokes Pipe.  :"not a whole lot".  At this point, he uses the pipe as a crutch, but probably would not be considered to be a "smoker"

## 2017-09-07 ENCOUNTER — Encounter: Payer: Self-pay | Admitting: Cardiology

## 2017-09-08 ENCOUNTER — Encounter: Payer: Self-pay | Admitting: Cardiology

## 2017-09-08 NOTE — Assessment & Plan Note (Signed)
Interestingly, despite having had multivessel disease leading to CABG and then sustained ventricular tachycardia with heart failure, he denied any anginal symptoms.  His major issue has been heart failure. The catheterization showed widely patent grafts, but severe native CAD which probably went unchecked for years..  However, his EF by echo was 20-25%.   In the absence of angina, would not pursue any ischemic evaluation at this time. He is on stable dose carvedilol and furosemide.  He was not sure what dose of losartan he was supposed to take, and therefore has been taking 25 mg.  I would continue that for now. He is on high-dose atorvastatin along with aspirin, but no Plavix.

## 2017-09-08 NOTE — Assessment & Plan Note (Signed)
This may be simply musculoskeletal, however with a history of PAD and aortobifem bypass, it is certainly well within reason to check aortic and lower extremity arterial Dopplers.

## 2017-09-08 NOTE — Assessment & Plan Note (Signed)
Last year his lipids look great.  He remains on high-dose atorvastatin.  I am not aware with the most recent check from the [place] was, but there is posterior been done in the interim since his last visit.  I would like to see what the levels are in order to determine whether or not we can reduce his atorvastatin dose.

## 2017-09-08 NOTE — Assessment & Plan Note (Signed)
Unfortunately, he really did not have anginal symptoms to speak of, was mostly heart failure.  He is not having any symptoms now.  We will simply need to monitor for unusual symptoms that are more heart failure as opposed to heart failure symptoms.

## 2017-09-08 NOTE — Assessment & Plan Note (Signed)
Doing well with stable heart failure symptoms.  Relatively euvolemic. It seems like his weight checks are correlating well with his thoracic impedance.   Discussed sliding scale Lasix. Continue current dose of carvedilol and losartan. Unfortunately, he cannot be on Entresto because it is not covered by the [place]

## 2017-09-08 NOTE — Assessment & Plan Note (Signed)
Unfortunately, his EF did not improve with medication adjustments and maintaining sinus rhythm with amiodarone.  This would suggest long-standing ischemic cardiomyopathy from his original CAD diagnosis. Euvolemic on exam.  On stable dose of Lasix.  We talked about sliding scale. I did suggest that he stay on the 25 mg losartan for now, we can consider adjusting in the future.  He had been switched from valsartan to losartan (originally intended to be 50 mg, but he was only taking 25.).

## 2017-09-08 NOTE — Assessment & Plan Note (Signed)
Status post aortobifem bypass back in 2014.  That is what started the whole process.  He does get fatigued and tired with walking.  It is not limiting however. He has not had any evaluation that he could not think of in the last couple years to evaluate his aortobifem graft. Plan: We will order abdominal aortic and lower extremity arterial Dopplers.

## 2017-09-08 NOTE — Assessment & Plan Note (Signed)
Blood pressure looks great on current dose of losartan and carvedilol.  There was a question of whether he should be on 50 mg versus 25 mg losartan.  Based on his current blood pressure I would stay at 25 mg for now.

## 2017-09-14 ENCOUNTER — Ambulatory Visit (INDEPENDENT_AMBULATORY_CARE_PROVIDER_SITE_OTHER): Payer: Medicare Other | Admitting: Internal Medicine

## 2017-09-14 ENCOUNTER — Encounter: Payer: Self-pay | Admitting: Internal Medicine

## 2017-09-14 VITALS — BP 118/80 | HR 72 | Ht 66.0 in | Wt 158.8 lb

## 2017-09-14 DIAGNOSIS — I472 Ventricular tachycardia, unspecified: Secondary | ICD-10-CM

## 2017-09-14 DIAGNOSIS — I200 Unstable angina: Secondary | ICD-10-CM

## 2017-09-14 DIAGNOSIS — I5022 Chronic systolic (congestive) heart failure: Secondary | ICD-10-CM | POA: Diagnosis not present

## 2017-09-14 DIAGNOSIS — Z95810 Presence of automatic (implantable) cardiac defibrillator: Secondary | ICD-10-CM | POA: Diagnosis not present

## 2017-09-14 NOTE — Patient Instructions (Signed)

## 2017-09-14 NOTE — Progress Notes (Signed)
HPI [name] returns today for ongoing evaluation and management of VT, chronic systolic heart failure, and ICD. In the interim, he has done well with no chest pain or sob. No syncope and no palpitations.  No Known Allergies   Current Outpatient Medications  Medication Sig Dispense Refill  . albuterol (PROVENTIL HFA) 108 (90 Base) MCG/ACT inhaler Inhale 1-2 puffs into the lungs every 6 (six) hours as needed for wheezing or shortness of breath.    [name]. amiodarone (PACERONE) 200 MG tablet 2 tabs twice daily x 1 week, then 1 tab twice daily x 2 wks, then 1 tab daily. 65 tablet 3  . aspirin EC 81 MG tablet Take 81 mg by mouth daily.      [name]. atorvastatin (LIPITOR) 80 MG tablet Take 40 mg by mouth at bedtime.    . carvedilol (COREG) 6.25 MG tablet Take 1 tablet (6.25 mg total) by mouth 2 (two) times daily with a meal. 60 tablet 6  . Cholecalciferol (VITAMIN D-3 PO) Take 2,000 mg by mouth daily.     . dicloxacillin (DYNAPEN) 250 MG capsule Take 500 mg by mouth 3 (three) times daily.    . fluticasone (FLONASE ALLERGY RELIEF) 50 MCG/ACT nasal spray Place 2 sprays into both nostrils daily as needed for allergies or rhinitis.    . furosemide (LASIX) 40 MG tablet Take 1 tablet (40 mg total) by mouth daily. 30 tablet 6  . glipiZIDE (GLUCOTROL) 5 MG tablet Take 2.5 mg by mouth daily before breakfast.    . losartan (COZAAR) 25 MG tablet Take 1 tablet (25 mg total) by mouth daily. 30 tablet 3  . magnesium oxide (MAG-OX) 400 (241.3 Mg) MG tablet Take 1 tablet (400 mg total) by mouth daily. 30 tablet 6  . metFORMIN (GLUCOPHAGE) 1000 MG tablet Take 1 tablet (1,000 mg total) by mouth 2 (two) times daily with a meal. Resume on 09/06/2016    . nitroGLYCERIN (NITROSTAT) 0.4 MG SL tablet Place 1 tablet (0.4 mg total) under the tongue every 5 (five) minutes x 3 doses as needed for chest pain. 25 tablet 3  . omeprazole (PRILOSEC) 20 MG capsule Take 20 mg by mouth daily.    . ranitidine (ZANTAC) 150 MG tablet  Take 150 mg by mouth daily as needed for heartburn.     . Tiotropium Bromide Monohydrate (SPIRIVA HANDIHALER IN) Inhale 18 mcg into the lungs daily.     No current facility-administered medications for this visit.      Past Medical History:  Diagnosis Date  . Acid reflux   . AICD (automatic cardioverter/defibrillator) present    a. 08/2016 St. Jude (serial Number [id number]) biventricular ICD.  [name]. Chronic systolic CHF (congestive heart failure) (HCC)    a. 08/2016 Echo: EF 20%, diffuse HK, inf, post AK, mod-sev MR, sev dil LA, mod TR, PASP 59mmHg.  [name]. Complication of anesthesia    "was told I responded well to anesthesia"  . Coronary artery disease involving native coronary artery of native heart with angina pectoris (HCC)    a. 10/2012 MI after aortobifem bypass, found to have multivessel CAD -->CABG x3;  b. 08/2016 Cath: LM 60, LAD 6038m, LCX 60ost/p, RCA 100p, VG->RPDA 50p, VG->OM1 ok, LIMA->LAD ok, EF 25%.  . Essential hypertension 03/03/2012  . History of blood transfusion 11/2012   "during his CABG"  . History of gout X 1  . History of stomach ulcers 1970s  . Hyperlipidemia associated with type 2 diabetes mellitus (HCC)  09/16/2011  . Ischemic cardiomyopathy    a. 08/2016 Echo: EF 20%, diffuse HK, inf, post AK;  b. 08/2016 St. Jude (serial Number [id number]) biventricular ICD.  [name] PAD (peripheral artery disease) (HCC)    s/p aortobifemoral bypass 10/2012  . PTSD (post-traumatic stress disorder)   . Type II diabetes mellitus (HCC)   . Ventricular tachycardia (HCC)    a. 08/2016 St. Jude (serial Number [id number]) biventricular ICD-->oral amio added.  . 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 Is now on chronic suppressive antibiotics with dicloxacillin    ROS:   All systems reviewed and negative except as noted in the HPI.   Past Surgical History:  Procedure  Laterality Date  . AORTO-FEMORAL BYPASS GRAFT Bilateral 10/2012   [facility]  . BI-VENTRICULAR IMPLANTABLE CARDIOVERTER DEFIBRILLATOR  (CRT-D)  09/03/2016  . CARDIAC CATHETERIZATION  11/2012   [facility] (? for abnormal Nuclear ST) --> no PCI, by report, patent grafts.  [name] CARDIAC CATHETERIZATION N/A 09/02/2016   Procedure: Left Heart Cath and Cors/Grafts Angiography;  Surgeon: Kathleene Hazel, MD;  [facility] INVASIVE CV LAB;  Service: Cardiovascular;  Laterality: N/A;  . CARDIAC CATHETERIZATION  10/2012; 03/2013  . CHOLECYSTECTOMY  03/05/2012   Procedure: LAPAROSCOPIC CHOLECYSTECTOMY WITH INTRAOPERATIVE CHOLANGIOGRAM;  Surgeon: Emelia Loron, MD;  Location: [facility] OR;  Service: General;  Laterality: N/A;  . CORONARY ARTERY BYPASS GRAFT  11/2012   CABG X 3;  [facility]  . EP IMPLANTABLE DEVICE N/A 09/03/2016   Procedure: BiV ICD Insertion CRT-D;  Surgeon: Marinus Maw, MD;  Location: [facility] INVASIVE CV LAB;  Service: Cardiovascular;  Laterality: N/A;  . INGUINAL HERNIA REPAIR Left 1990s  . STERNOTOMY  2015   re-do sternotomy with metal place "sheild" placed. -Has chronic staph infection, on chronic suppressive medication  . TRANSTHORACIC ECHOCARDIOGRAM  02/28/2017   EF remains 20p-25% severely reduced function. Diffuse hypokinesis. "Grade 1 diastolic dysfunction ". Severely calcified aortic valve with restricted mobility (functioning bicuspid) however no suggestion of stenosis.  [name] UMBILICAL HERNIA REPAIR  03/05/2012   Procedure: HERNIA REPAIR UMBILICAL ADULT;  Surgeon: Emelia Loron, MD;  Location: [facility] OR;  Service: General;  Laterality: N/A;     Family History  Problem Relation Age of Onset  . Cancer Brother        esophageal     Social History   Socioeconomic History  . Marital status: Married    Spouse name: Not on file  . Number of children: Not on file  . Years of education: Not on file  . Highest education level: Not on file  Social Needs  . Financial resource  strain: Not on file  . Food insecurity - worry: Not on file  . Food insecurity - inability: Not on file  . Transportation needs - medical: Not on file  . Transportation needs - non-medical: Not on file  Occupational History  . Not on file  Tobacco Use  . Smoking status: Current Every Day Smoker    Types: Cigars, Cigarettes  . Smokeless tobacco: Former Neurosurgeon    Types: Snuff, Chew  . Tobacco comment: 09/03/2016 "used smokeless tobacco in my teens; quit smoking cigarettes in the early 1980s; smokes pipe only"  Substance and Sexual Activity  . Alcohol use: No  . Drug use: No  . Sexual activity: Not on file  Other Topics Concern  . Not on file  Social History  Narrative  . Not on file     BP 118/80   Pulse 72   Ht 5\' 6"  (1.676 m)   Wt 158 lb 12.8 oz (72 kg)   SpO2 93%   BMI 25.63 kg/m   Physical Exam:  Well appearing 71 yo man, NAD HEENT: Unremarkable Neck:  6 cm JVD, no thyromegally Lymphatics:  No adenopathy Back:  No CVA tenderness Lungs:  Clear with no wheezes HEART:  Regular rate rhythm, no murmurs, no rubs, no clicks Abd:  soft, positive bowel sounds, no organomegally, no rebound, no guarding Ext:  2 plus pulses, no edema, no cyanosis, no clubbing Skin:  No rashes no nodules Neuro:  CN II through XII intact, motor grossly intact  DEVICE  Normal device function.  See PaceArt for details.   Assess/Plan: 1. VT - he has had no more VT. He will continue amiodarone 200 mg daily. 2. Chronic systolic heart failure - He has class 2 symptoms. He will continue his current meds. 3. ICD - his St. Jude Biv ICD is working normally. His fluid is up a bit and he admits to dietary indiscretion.   Leonia Reeves.D.

## 2017-09-15 ENCOUNTER — Other Ambulatory Visit: Payer: Self-pay | Admitting: Cardiology

## 2017-09-15 DIAGNOSIS — I739 Peripheral vascular disease, unspecified: Secondary | ICD-10-CM

## 2017-09-15 LAB — CUP PACEART INCLINIC DEVICE CHECK
Brady Statistic RA Percent Paced: 0.26 %
Brady Statistic RV Percent Paced: 98 %
Date Time Interrogation Session: 20181226181719
HIGH POWER IMPEDANCE MEASURED VALUE: 59.625
Implantable Lead Implant Date: 20171215
Implantable Lead Location: 753858
Lead Channel Impedance Value: 612.5 Ohm
Lead Channel Pacing Threshold Amplitude: 0.75 V
Lead Channel Pacing Threshold Amplitude: 1.25 V
Lead Channel Pacing Threshold Pulse Width: 0.5 ms
Lead Channel Setting Pacing Amplitude: 2 V
Lead Channel Setting Pacing Amplitude: 2 V
Lead Channel Setting Pacing Pulse Width: 0.5 ms
Lead Channel Setting Sensing Sensitivity: 0.5 mV
MDC IDC LEAD IMPLANT DT: 20171215
MDC IDC LEAD IMPLANT DT: 20171215
MDC IDC LEAD LOCATION: 753859
MDC IDC LEAD LOCATION: 753860
MDC IDC MSMT BATTERY REMAINING LONGEVITY: 72 mo
MDC IDC MSMT LEADCHNL LV IMPEDANCE VALUE: 400 Ohm
MDC IDC MSMT LEADCHNL LV PACING THRESHOLD PULSEWIDTH: 0.5 ms
MDC IDC MSMT LEADCHNL RA IMPEDANCE VALUE: 425 Ohm
MDC IDC MSMT LEADCHNL RA PACING THRESHOLD AMPLITUDE: 0.75 V
MDC IDC MSMT LEADCHNL RA SENSING INTR AMPL: 3.5 mV
MDC IDC MSMT LEADCHNL RV PACING THRESHOLD PULSEWIDTH: 0.5 ms
MDC IDC MSMT LEADCHNL RV SENSING INTR AMPL: 9 mV
MDC IDC PG IMPLANT DT: 20171215
MDC IDC SET LEADCHNL LV PACING AMPLITUDE: 2 V
MDC IDC SET LEADCHNL LV PACING PULSEWIDTH: 0.5 ms
Pulse Gen Serial Number: 7368976

## 2017-09-21 ENCOUNTER — Ambulatory Visit (HOSPITAL_COMMUNITY)
Admission: RE | Admit: 2017-09-21 | Discharge: 2017-09-21 | Disposition: A | Discharge status: Home / Self Care | Payer: Medicare Other | Source: Ambulatory Visit | Attending: Internal Medicine | Admitting: Internal Medicine

## 2017-09-21 ENCOUNTER — Ambulatory Visit (HOSPITAL_BASED_OUTPATIENT_CLINIC_OR_DEPARTMENT_OTHER)
Admission: RE | Admit: 2017-09-21 | Discharge: 2017-09-21 | Disposition: A | Discharge status: Home / Self Care | Payer: Medicare Other | Source: Ambulatory Visit | Attending: Internal Medicine | Admitting: Internal Medicine

## 2017-09-21 DIAGNOSIS — M6289 Other specified disorders of muscle: Secondary | ICD-10-CM

## 2017-09-21 DIAGNOSIS — I5042 Chronic combined systolic (congestive) and diastolic (congestive) heart failure: Secondary | ICD-10-CM

## 2017-09-21 DIAGNOSIS — Z95828 Presence of other vascular implants and grafts: Secondary | ICD-10-CM | POA: Diagnosis not present

## 2017-09-21 DIAGNOSIS — I420 Dilated cardiomyopathy: Secondary | ICD-10-CM

## 2017-09-21 DIAGNOSIS — I739 Peripheral vascular disease, unspecified: Secondary | ICD-10-CM

## 2017-10-07 ENCOUNTER — Telehealth: Payer: Self-pay | Admitting: *Deleted

## 2017-10-07 DIAGNOSIS — Z95828 Presence of other vascular implants and grafts: Secondary | ICD-10-CM

## 2017-10-07 DIAGNOSIS — I739 Peripheral vascular disease, unspecified: Secondary | ICD-10-CM

## 2017-10-07 NOTE — Telephone Encounter (Signed)
Order annual follow up appointment bilatera lower extremity  For jan 2020 Patient  aware by mychart on 09/25/17

## 2017-10-07 NOTE — Telephone Encounter (Signed)
-----   Message from Marykay Lex, MD sent at 09/25/2017 10:28 PM EST ----- Dopplers from the right lower extremity look normal.   Left common femoral artery has 50-74% (moderate stenosis.  Otherwise the rest remains relatively patent.  Recommend annual f/u  Bryan Lemma, MD  pls fwd to PCP: Charolett Bumpers, PA-C

## 2017-10-12 ENCOUNTER — Telehealth: Payer: Self-pay | Admitting: Internal Medicine

## 2017-10-12 NOTE — Telephone Encounter (Signed)
   Primary Cardiologist: Dr. Lewayne Bunting  Chart reviewed as part of pre-operative protocol coverage. Given past medical history and time since last visit (09/14/2017) and based on ACC/AHA guidelines, [name] [name] would be at acceptable risk for the planned procedure without further cardiovascular testing.   I will route this recommendation to the requesting party via Epic fax function and remove from pre-op pool.  Please call with questions.  Norma Fredrickson, NP 10/12/2017, 3:19 PM

## 2017-10-12 NOTE — Telephone Encounter (Signed)
° °  [facility] Pre-operative Risk Assessment    Request for surgical clearance:  1. What type of surgery is being performed? Left cataract extraction   2. When is this surgery scheduled? 10/13/17  3. What type of clearance is required (medical clearance vs. Pharmacy clearance to hold med vs. Both)? Medical clearance   4. Are there any medications that need to be held prior to surgery and how long? Please indicate any medications that need to be held.  5. Practice name and name of physician performing surgery? [place], [place]  6. What is your office phone and fax number? PH# [phone], FAX# [phone], ATTN: Lowella Curb, PA   7. Anesthesia type (None, local, MAC, general) ? Not listed   Derl Barrow 10/12/2017, 2:03 PM  _________________________________________________________________   (provider comments below)

## 2017-10-12 NOTE — Telephone Encounter (Signed)
Faxing to [employer], PA @ [facility] @ [phone].

## 2017-10-17 ENCOUNTER — Telehealth: Payer: Self-pay | Admitting: Cardiology

## 2017-10-17 ENCOUNTER — Ambulatory Visit (INDEPENDENT_AMBULATORY_CARE_PROVIDER_SITE_OTHER): Payer: Medicare Other | Admitting: *Deleted

## 2017-10-17 DIAGNOSIS — I472 Ventricular tachycardia, unspecified: Secondary | ICD-10-CM

## 2017-10-17 DIAGNOSIS — I5022 Chronic systolic (congestive) heart failure: Secondary | ICD-10-CM | POA: Diagnosis not present

## 2017-10-17 DIAGNOSIS — Z95810 Presence of automatic (implantable) cardiac defibrillator: Secondary | ICD-10-CM

## 2017-10-17 MED ORDER — LOSARTAN POTASSIUM 25 MG PO TABS: 25.0000 mg | ORAL_TABLET | Freq: Every day | ORAL | 3 refills | Status: DC

## 2017-10-17 NOTE — Progress Notes (Signed)
Remote ICD transmission.   

## 2017-10-17 NOTE — Telephone Encounter (Signed)
New Message    *STAT* If patient is at the pharmacy, call can be transferred to refill team.   1. Which medications need to be refilled? (please list name of each medication and dose if known) Losartan 25mg   2. Which pharmacy/location (including street and city if local pharmacy) [facility]   3. Do they need a 30 day or 90 day supply? 90  Patients daughter [name]  is requesting that the rx for the losartan be sent to the [place]. Attention Dr. Verta Ellen fax number [phone]

## 2017-10-18 NOTE — Progress Notes (Signed)
EPIC Encounter for ICM Monitoring  Patient Name: [name] [name] is a 72 y.o. male Date: 10/18/2017 Primary Care Physican: Charolett Bumpers, PA-C Primary Cardiologist: Herbie Baltimore  Electrophysiologist: Ladona Ridgel Dry Weight: 153lbs  Bi-V Pacing: >99%        Heart Failure questions reviewed, pt asymptomatic.   Thoracic impedance normal.  Prescribed dosage: Furosemide 40 mg 1 tablet daily. Per Dr Herbie Baltimore note 09/05/2017, dicussed sliding scale Lasix.   Labs: 10/27/2016 Creatinine 1.08, BUN 13, Potassium 4.9, Sodium 133 09/08/2016 Creatinine 1.19, BUN 11, Potassium 5.0, Sodium 135 09/03/2016 Creatinine 1.36, BUN 17, Potassium 3.9, Sodium 128 09/02/2016 Creatinine 1.13, BUN 14, Potassium 3.9, Sodium 134 09/01/2017 Creatinine 1.30, BUN 15, Potassium 5.3, Sodium 133  Recommendations: No changes.   Encouraged to call for fluid symptoms.  Follow-up plan: ICM clinic phone appointment on 11/17/2017.     Copy of ICM check sent to Dr. Ladona Ridgel.   3 month ICM trend: 10/17/2017    1 Year ICM trend:       Karie Soda, RN 10/18/2017 2:57 PM

## 2017-10-19 ENCOUNTER — Encounter: Payer: Self-pay | Admitting: Cardiology

## 2017-10-19 LAB — CUP PACEART REMOTE DEVICE CHECK
Battery Remaining Longevity: 73 mo
Battery Remaining Percentage: 81 %
Battery Voltage: 2.99 V
Brady Statistic RA Percent Paced: 1 %
HIGH POWER IMPEDANCE MEASURED VALUE: 65 Ohm
HighPow Impedance: 65 Ohm
Implantable Lead Location: 753858
Implantable Lead Location: 753860
Implantable Pulse Generator Implant Date: 20171215
Lead Channel Impedance Value: 410 Ohm
Lead Channel Impedance Value: 440 Ohm
Lead Channel Impedance Value: 650 Ohm
Lead Channel Pacing Threshold Amplitude: 0.75 V
Lead Channel Pacing Threshold Amplitude: 0.75 V
Lead Channel Pacing Threshold Amplitude: 1.25 V
Lead Channel Pacing Threshold Pulse Width: 0.5 ms
Lead Channel Sensing Intrinsic Amplitude: 3.7 mV
Lead Channel Setting Pacing Amplitude: 2 V
Lead Channel Setting Pacing Pulse Width: 0.5 ms
Lead Channel Setting Sensing Sensitivity: 0.5 mV
MDC IDC LEAD IMPLANT DT: 20171215
MDC IDC LEAD IMPLANT DT: 20171215
MDC IDC LEAD IMPLANT DT: 20171215
MDC IDC LEAD LOCATION: 753859
MDC IDC MSMT LEADCHNL LV PACING THRESHOLD PULSEWIDTH: 0.5 ms
MDC IDC MSMT LEADCHNL RA SENSING INTR AMPL: 1.9 mV
MDC IDC MSMT LEADCHNL RV PACING THRESHOLD PULSEWIDTH: 0.5 ms
MDC IDC PG SERIAL: 7368976
MDC IDC SESS DTM: 20190128080258
MDC IDC SET LEADCHNL LV PACING PULSEWIDTH: 0.5 ms
MDC IDC SET LEADCHNL RA PACING AMPLITUDE: 2 V
MDC IDC SET LEADCHNL RV PACING AMPLITUDE: 2 V
MDC IDC STAT BRADY AP VP PERCENT: 1 %
MDC IDC STAT BRADY AP VS PERCENT: 1 %
MDC IDC STAT BRADY AS VP PERCENT: 99 %
MDC IDC STAT BRADY AS VS PERCENT: 1 %

## 2017-10-24 NOTE — Telephone Encounter (Signed)
LEFT MESSAGE PRESCRIPTION HAS BEEN FAXED.

## 2017-10-24 NOTE — Telephone Encounter (Signed)
Faxed prescription to [facility]

## 2017-11-17 ENCOUNTER — Ambulatory Visit (INDEPENDENT_AMBULATORY_CARE_PROVIDER_SITE_OTHER): Payer: Medicare Other

## 2017-11-17 DIAGNOSIS — I5022 Chronic systolic (congestive) heart failure: Secondary | ICD-10-CM

## 2017-11-17 DIAGNOSIS — Z95810 Presence of automatic (implantable) cardiac defibrillator: Secondary | ICD-10-CM | POA: Diagnosis not present

## 2017-11-17 NOTE — Progress Notes (Addendum)
EPIC Encounter for ICM Monitoring  Patient Name: [name] [name] is a 72 y.o. male Date: 11/17/2017 Primary Care Physican: Charolett Bumpers, PA-C Primary Cardiologist: Herbie Baltimore  Electrophysiologist: Ladona Ridgel Dry Weight: 154lbs  Bi-V Pacing: >99%          Heart Failure questions reviewed, pt reported not sleeping well at night but not due to any shortness of breath.  He reported feeling fine.  Weight up 1 lb    Thoracic impedance abnormal suggesting fluid accumulation since 11/07/2017.  Prescribed dosage: Furosemide 40 mg 1 tablet daily. Per Dr Herbie Baltimore note 09/05/2017, dicussed sliding scale Lasix.   Labs: 10/27/2016 Creatinine 1.08, BUN 13, Potassium 4.9, Sodium 133 09/08/2016 Creatinine 1.19, BUN 11, Potassium 5.0, Sodium 135 09/03/2016 Creatinine 1.36, BUN 17, Potassium 3.9, Sodium 128 09/02/2016 Creatinine 1.13, BUN 14, Potassium 3.9, Sodium 134 09/01/2017 Creatinine 1.30, BUN 15, Potassium 5.3, Sodium 133  Recommendations:  Advised to take 1 extra Lasix tomorrow and limit salt intake.   Encouraged to call for fluid symptoms.  Follow-up plan: ICM clinic phone appointment on 11/25/2017 to recheck fluid levels.   Copy of ICM check sent to Dr. Ladona Ridgel and Dr. Herbie Baltimore.  3 month ICM trend: 11/17/2017    1 Year ICM trend:       Karie Soda, RN 11/17/2017 9:02 AM

## 2017-11-25 ENCOUNTER — Ambulatory Visit (INDEPENDENT_AMBULATORY_CARE_PROVIDER_SITE_OTHER): Payer: Self-pay

## 2017-11-25 ENCOUNTER — Telehealth: Payer: Self-pay

## 2017-11-25 DIAGNOSIS — Z95810 Presence of automatic (implantable) cardiac defibrillator: Secondary | ICD-10-CM

## 2017-11-25 DIAGNOSIS — I5022 Chronic systolic (congestive) heart failure: Secondary | ICD-10-CM

## 2017-11-25 NOTE — Telephone Encounter (Signed)
Remote ICM transmission received.  Attempted call to patient and left detailed message per DPR regarding transmission and next ICM scheduled for 12/19/2017.  Advised to return call for any fluid symptoms or questions.    

## 2017-11-25 NOTE — Progress Notes (Signed)
EPIC Encounter for ICM Monitoring  Patient Name: [name] [name] is a 72 y.o. male Date: 11/25/2017 Primary Care Physican: Charolett Bumpers, PA-C Primary Cardiologist: Herbie Baltimore  Electrophysiologist: Ladona Ridgel Dry Weight: Previous weight 154lbs  Bi-V Pacing: >99%      Attempted call to patient and unable to reach.  Left detailed message regarding transmission.  Transmission reviewed.    Thoracic impedance returned to normal after taking extra Lasix.  Prescribed dosage: Furosemide 40 mg 1 tablet daily. Per Dr Herbie Baltimore note 09/05/2017,dicussed sliding scale Lasix.   Labs: 10/27/2016 Creatinine 1.08, BUN 13, Potassium 4.9, Sodium 133 09/08/2016 Creatinine 1.19, BUN 11, Potassium 5.0, Sodium 135 09/03/2016 Creatinine 1.36, BUN 17, Potassium 3.9, Sodium 128 09/02/2016 Creatinine 1.13, BUN 14, Potassium 3.9, Sodium 134 09/01/2017 Creatinine 1.30, BUN 15, Potassium 5.3, Sodium 133  Recommendations: Left voice mail with ICM number and encouraged to call if experiencing any fluid symptoms.  Follow-up plan: ICM clinic phone appointment on 12/19/2017.    Copy of ICM check sent to Dr. Ladona Ridgel.   3 month ICM trend: 11/25/2017    1 Year ICM trend:      Karie Soda, RN 11/25/2017 12:41 PM

## 2017-12-19 ENCOUNTER — Ambulatory Visit (INDEPENDENT_AMBULATORY_CARE_PROVIDER_SITE_OTHER): Payer: Medicare Other

## 2017-12-19 DIAGNOSIS — I5022 Chronic systolic (congestive) heart failure: Secondary | ICD-10-CM

## 2017-12-19 DIAGNOSIS — Z95810 Presence of automatic (implantable) cardiac defibrillator: Secondary | ICD-10-CM

## 2017-12-19 NOTE — Progress Notes (Signed)
EPIC Encounter for ICM Monitoring  Patient Name: [name] [name] is a 72 y.o. male Date: 12/19/2017 Primary Care Physican: Charolett Bumpers, PA-C Primary Cardiologist: Herbie Baltimore  Electrophysiologist: Ladona Ridgel Dry Weight: Previous weight 154lbs  Bi-V Pacing: >99%                Attempted call to patient and unable to reach.   Transmission reviewed.    Thoracic impedance abnormal suggesting fluid accumulation starting 12/09/2017 with exception one day at baseline.  Prescribed dosage: Furosemide 40 mg 1 tablet daily. Per Dr Herbie Baltimore note 09/05/2017,dicussed sliding scale Lasix.   Labs: 10/27/2016 Creatinine 1.08, BUN 13, Potassium 4.9, Sodium 133 09/08/2016 Creatinine 1.19, BUN 11, Potassium 5.0, Sodium 135 09/03/2016 Creatinine 1.36, BUN 17, Potassium 3.9, Sodium 128 09/02/2016 Creatinine 1.13, BUN 14, Potassium 3.9, Sodium 134 09/01/2017 Creatinine 1.30, BUN 15, Potassium 5.3, Sodium 133  Recommendations:  NONE - Unable to reach.  Follow-up plan: ICM clinic phone appointment on 12/27/2017 to recheck fluid levels.    Copy of ICM check sent to Dr. Ladona Ridgel and Dr. Herbie Baltimore.   3 month ICM trend: 12/19/2017    1 Year ICM trend:       Karie Soda, RN 12/19/2017 10:03 AM

## 2017-12-27 ENCOUNTER — Ambulatory Visit (INDEPENDENT_AMBULATORY_CARE_PROVIDER_SITE_OTHER): Payer: Self-pay

## 2017-12-27 DIAGNOSIS — I5022 Chronic systolic (congestive) heart failure: Secondary | ICD-10-CM

## 2017-12-27 DIAGNOSIS — Z95810 Presence of automatic (implantable) cardiac defibrillator: Secondary | ICD-10-CM

## 2017-12-27 NOTE — Progress Notes (Signed)
EPIC Encounter for ICM Monitoring  Patient Name: [name] [name] is a 72 y.o. male Date: 12/27/2017 Primary Care Physican: Charolett Bumpers, PA-C Primary Cardiologist: Herbie Baltimore  Electrophysiologist: Ladona Ridgel Dry Weight: 152lbs  Bi-V Pacing: >99%      Heart Failure questions reviewed, pt asymptomatic.  He said he ate out this past weekend which the foods are higher in salt.  He usually tries to do his own cooking which gives him better control over the salt amount.    Thoracic impedance returned to normal after 4/1 remote transmission but impedance has decreased again on 12/24/2017 suggesting fluid accumulation.  Prescribed dosage: Furosemide 40 mg 1 tablet daily. Per Dr Herbie Baltimore note 09/05/2017,dicussed sliding scale Lasix.   Labs: 10/27/2016 Creatinine 1.08, BUN 13, Potassium 4.9, Sodium 133 09/08/2016 Creatinine 1.19, BUN 11, Potassium 5.0, Sodium 135 09/03/2016 Creatinine 1.36, BUN 17, Potassium 3.9, Sodium 128 09/02/2016 Creatinine 1.13, BUN 14, Potassium 3.9, Sodium 134 09/01/2017 Creatinine 1.30, BUN 15, Potassium 5.3, Sodium 133  Recommendations: No changes.   Encouraged to call for fluid symptoms.  Follow-up plan: ICM clinic phone appointment on 01/05/2018 to recheck fluid levels.    Copy of ICM check sent to Dr. Ladona Ridgel and Dr. Herbie Baltimore.   3 month ICM trend: 12/27/2017    1 Year ICM trend:       Karie Soda, RN 12/27/2017 8:05 AM

## 2018-01-05 ENCOUNTER — Ambulatory Visit (INDEPENDENT_AMBULATORY_CARE_PROVIDER_SITE_OTHER): Payer: Self-pay

## 2018-01-05 DIAGNOSIS — I5022 Chronic systolic (congestive) heart failure: Secondary | ICD-10-CM

## 2018-01-05 DIAGNOSIS — Z95810 Presence of automatic (implantable) cardiac defibrillator: Secondary | ICD-10-CM

## 2018-01-05 NOTE — Progress Notes (Signed)
EPIC Encounter for ICM Monitoring  Patient Name: [name] [name] is a 72 y.o. male Date: 01/05/2018 Primary Care Physican: Charolett Bumpers, PA-C Primary Cardiologist: Herbie Baltimore  Electrophysiologist: Ladona Ridgel Dry Weight:150lbs  Bi-V Pacing: >99%       Heart Failure questions reviewed, pt asymptomatic.   Thoracic impedance returned to normal since 12/27/2017 remote transmission.  Prescribed dosage: Furosemide 40 mg 1 tablet daily. Per Dr Herbie Baltimore note 09/05/2017,dicussed sliding scale Lasix.   Labs: 10/27/2016 Creatinine 1.08, BUN 13, Potassium 4.9, Sodium 133 09/08/2016 Creatinine 1.19, BUN 11, Potassium 5.0, Sodium 135 09/03/2016 Creatinine 1.36, BUN 17, Potassium 3.9, Sodium 128 09/02/2016 Creatinine 1.13, BUN 14, Potassium 3.9, Sodium 134 09/01/2017 Creatinine 1.30, BUN 15, Potassium 5.3, Sodium 133  Recommendations: No changes.  Encouraged to call for fluid symptoms.  Follow-up plan: ICM clinic phone appointment on 01/19/2018.  Office appointment scheduled 03/14/2018 with Dr. Herbie Baltimore.  Copy of ICM check sent to Dr. Ladona Ridgel.   3 month ICM trend: 01/05/2018    1 Year ICM trend:       Karie Soda, RN 01/05/2018 2:25 PM

## 2018-01-19 ENCOUNTER — Ambulatory Visit (INDEPENDENT_AMBULATORY_CARE_PROVIDER_SITE_OTHER): Payer: Medicare Other | Admitting: *Deleted

## 2018-01-19 DIAGNOSIS — I472 Ventricular tachycardia, unspecified: Secondary | ICD-10-CM

## 2018-01-19 DIAGNOSIS — I5042 Chronic combined systolic (congestive) and diastolic (congestive) heart failure: Secondary | ICD-10-CM | POA: Diagnosis not present

## 2018-01-19 DIAGNOSIS — I5022 Chronic systolic (congestive) heart failure: Secondary | ICD-10-CM

## 2018-01-19 DIAGNOSIS — Z95810 Presence of automatic (implantable) cardiac defibrillator: Secondary | ICD-10-CM

## 2018-01-19 NOTE — Progress Notes (Signed)
EPIC Encounter for ICM Monitoring  Patient Name: [name] [name] is a 72 y.o. male Date: 01/19/2018 Primary Care Physican: Charolett Bumpers, PA-C Primary Cardiologist: Herbie Baltimore  Electrophysiologist: Ladona Ridgel Dry Weight:152lbs  Bi-V Pacing: >99%                                      Heart Failure questions reviewed, pt asymptomatic.   Thoracic impedance slightly below baseline normal.  Prescribed dosage: Furosemide 40 mg 1 tablet daily. Per Dr Herbie Baltimore note 09/05/2017,dicussed sliding scale Lasix.   Labs: 10/27/2016 Creatinine 1.08, BUN 13, Potassium 4.9, Sodium 133 09/08/2016 Creatinine 1.19, BUN 11, Potassium 5.0, Sodium 135 09/03/2016 Creatinine 1.36, BUN 17, Potassium 3.9, Sodium 128 09/02/2016 Creatinine 1.13, BUN 14, Potassium 3.9, Sodium 134 09/01/2017 Creatinine 1.30, BUN 15, Potassium 5.3, Sodium 133   Recommendations: Advised to limit sodium since he may be developing a little fluid accumulation.  He knows to take extra Lasix if needed for symptoms.  Encouraged to call for fluid symptoms.  Follow-up plan: ICM clinic phone appointment on 02/20/2018.    Copy of ICM check sent to Dr. Ladona Ridgel.   3 month ICM trend: 01/19/2018    1 Year ICM trend:      Karie Soda, RN 01/19/2018 3:40 PM

## 2018-01-19 NOTE — Progress Notes (Signed)
Remote ICD transmission.   

## 2018-01-24 ENCOUNTER — Encounter: Payer: Self-pay | Admitting: Cardiology

## 2018-01-25 ENCOUNTER — Other Ambulatory Visit: Payer: Self-pay | Admitting: Cardiology

## 2018-01-25 MED ORDER — CARVEDILOL 6.25 MG PO TABS: 6.2500 mg | ORAL_TABLET | Freq: Two times a day (BID) | ORAL | 2 refills | Status: DC

## 2018-01-25 NOTE — Telephone Encounter (Signed)
New Message:       *STAT* If patient is at the pharmacy, call can be transferred to refill team.   1. Which medications need to be refilled? (please list name of each medication and dose if known) carvedilol (COREG) 6.25 MG tablet  2. Which pharmacy/location (including street and city if local pharmacy) is medication to be sent to?[facility] - [place],  - [address]  3. Do they need a 30 day or 90 day supply? 90

## 2018-01-25 NOTE — Telephone Encounter (Signed)
Rx(s) sent to pharmacy electronically.  

## 2018-02-07 LAB — CUP PACEART REMOTE DEVICE CHECK
Battery Remaining Longevity: 68 mo
Battery Voltage: 2.98 V
Brady Statistic AP VP Percent: 1 %
Brady Statistic RA Percent Paced: 1 %
HighPow Impedance: 62 Ohm
HighPow Impedance: 62 Ohm
Implantable Lead Implant Date: 20171215
Implantable Lead Implant Date: 20171215
Implantable Lead Location: 753859
Implantable Lead Location: 753860
Implantable Lead Model: 7122
Implantable Pulse Generator Implant Date: 20171215
Lead Channel Impedance Value: 360 Ohm
Lead Channel Impedance Value: 430 Ohm
Lead Channel Pacing Threshold Amplitude: 0.75 V
Lead Channel Pacing Threshold Amplitude: 1.25 V
Lead Channel Pacing Threshold Pulse Width: 0.5 ms
Lead Channel Pacing Threshold Pulse Width: 0.5 ms
Lead Channel Pacing Threshold Pulse Width: 0.5 ms
Lead Channel Sensing Intrinsic Amplitude: 5.8 mV
Lead Channel Setting Pacing Amplitude: 2 V
Lead Channel Setting Pacing Amplitude: 2 V
Lead Channel Setting Pacing Pulse Width: 0.5 ms
Lead Channel Setting Sensing Sensitivity: 0.5 mV
MDC IDC LEAD IMPLANT DT: 20171215
MDC IDC LEAD LOCATION: 753858
MDC IDC MSMT BATTERY REMAINING PERCENTAGE: 78 %
MDC IDC MSMT LEADCHNL RA PACING THRESHOLD AMPLITUDE: 0.75 V
MDC IDC MSMT LEADCHNL RA SENSING INTR AMPL: 3.4 mV
MDC IDC MSMT LEADCHNL RV IMPEDANCE VALUE: 540 Ohm
MDC IDC PG SERIAL: 7368976
MDC IDC SESS DTM: 20190502060015
MDC IDC SET LEADCHNL LV PACING PULSEWIDTH: 0.5 ms
MDC IDC SET LEADCHNL RV PACING AMPLITUDE: 2 V
MDC IDC STAT BRADY AP VS PERCENT: 1 %
MDC IDC STAT BRADY AS VP PERCENT: 99 %
MDC IDC STAT BRADY AS VS PERCENT: 1 %

## 2018-02-20 ENCOUNTER — Ambulatory Visit (INDEPENDENT_AMBULATORY_CARE_PROVIDER_SITE_OTHER): Payer: Medicare Other

## 2018-02-20 ENCOUNTER — Telehealth: Payer: Self-pay

## 2018-02-20 DIAGNOSIS — Z95810 Presence of automatic (implantable) cardiac defibrillator: Secondary | ICD-10-CM

## 2018-02-20 DIAGNOSIS — I5022 Chronic systolic (congestive) heart failure: Secondary | ICD-10-CM | POA: Diagnosis not present

## 2018-02-20 NOTE — Telephone Encounter (Signed)
Returned call as requested by voice mail.  He is unable to get remote monitor to work.  Attempted troubleshooting and not successful.  Advised to call [employer] number and he has that number on the machine.  He will call today.

## 2018-02-20 NOTE — Telephone Encounter (Signed)
Spoke with pt and reminded pt of remote transmission that is due today. Pt verbalized understanding.   

## 2018-02-21 ENCOUNTER — Telehealth: Payer: Self-pay

## 2018-02-21 NOTE — Progress Notes (Signed)
EPIC Encounter for ICM Monitoring  Patient Name: [name] [name] is a 72 y.o. male Date: 02/21/2018 Primary Care Physican: Charolett Bumpers, PA-C Primary Cardiologist: Herbie Baltimore  Electrophysiologist: Ladona Ridgel Dry Weight: Previous weight 152lbs  Bi-V Pacing: >99%      Attempted call to patient and unable to reach.  Left message to return call.  Transmission reviewed.    Thoracic impedance abnormal suggesting fluid accumulation since 02/18/2018.  Prescribed dosage: Furosemide 40 mg 1 tablet daily. Per Dr Herbie Baltimore note 09/05/2017,dicussed sliding scale Lasix.   Labs: 10/27/2016 Creatinine 1.08, BUN 13, Potassium 4.9, Sodium 133 09/08/2016 Creatinine 1.19, BUN 11, Potassium 5.0, Sodium 135 09/03/2016 Creatinine 1.36, BUN 17, Potassium 3.9, Sodium 128 09/02/2016 Creatinine 1.13, BUN 14, Potassium 3.9, Sodium 134 09/01/2017 Creatinine 1.30, BUN 15, Potassium 5.3, Sodium 133  Recommendations: NONE - Unable to reach.  Follow-up plan: ICM clinic phone appointment on 03/09/2018 to recheck fluid levels.  Office appointment scheduled 03/13/2018 with Dr. Herbie Baltimore.  Copy of ICM check sent to Dr. Ladona Ridgel and Dr. Herbie Baltimore.   3 month ICM trend: 02/21/2018    1 Year ICM trend:       Karie Soda, RN 02/21/2018 11:48 AM

## 2018-02-21 NOTE — Telephone Encounter (Signed)
Remote ICM transmission received.  Attempted call to patient and left message to return call. 

## 2018-02-23 NOTE — Progress Notes (Signed)
Patient reported weight gain of 3 lbs and took extra Lasix on 02/19/2018. Weight is back down close to baseline at 153 lbs.  Advised report suggests he may still have fluid and to follow Dr Erich Montane sliding scale instructions of taking an additional Lasix if needed.  Will recheck fluid levels on 03/09/2018.

## 2018-03-09 ENCOUNTER — Ambulatory Visit (INDEPENDENT_AMBULATORY_CARE_PROVIDER_SITE_OTHER): Payer: Self-pay

## 2018-03-09 DIAGNOSIS — Z95810 Presence of automatic (implantable) cardiac defibrillator: Secondary | ICD-10-CM

## 2018-03-09 DIAGNOSIS — I5022 Chronic systolic (congestive) heart failure: Secondary | ICD-10-CM

## 2018-03-10 ENCOUNTER — Telehealth: Payer: Self-pay

## 2018-03-10 NOTE — Telephone Encounter (Signed)
Remote ICM transmission received.  Attempted call to patient and left detailed message, per DPR, regarding transmission and next ICM scheduled for 04/06/2018.  Advised to return call for any fluid symptoms or questions.    

## 2018-03-10 NOTE — Progress Notes (Signed)
EPIC Encounter for ICM Monitoring  Patient Name: [name] [name] is a 72 y.o. male Date: 03/10/2018 Primary Care Physican: Charolett Bumpers, PA-C Primary Cardiologist: Herbie Baltimore  Electrophysiologist: Ladona Ridgel Dry Weight: Previous weight 153lbs  Bi-V Pacing: >99%      Attempted call to patient and unable to reach.  Left detailed message, per DPR, regarding transmission.  Transmission reviewed.    Thoracic impedance returned to normal since last ICM remote transmission 02/20/2018.  Prescribed dosage: Furosemide 40 mg 1 tablet daily. Per Dr Herbie Baltimore note 09/05/2017,dicussed sliding scale Lasix.   Labs: 10/27/2016 Creatinine 1.08, BUN 13, Potassium 4.9, Sodium 133 09/08/2016 Creatinine 1.19, BUN 11, Potassium 5.0, Sodium 135 09/03/2016 Creatinine 1.36, BUN 17, Potassium 3.9, Sodium 128 09/02/2016 Creatinine 1.13, BUN 14, Potassium 3.9, Sodium 134 09/01/2017 Creatinine 1.30, BUN 15, Potassium 5.3, Sodium 133  Recommendations: Left voice mail with ICM number and encouraged to call if experiencing any fluid symptoms.  Follow-up plan: ICM clinic phone appointment on 04/06/2018.    Copy of ICM check sent to Dr. Ladona Ridgel and Dr Herbie Baltimore since patient has office appointment on 03/13/2018.    3 month ICM trend: 03/09/2018    1 Year ICM trend:       Karie Soda, RN 03/10/2018 8:14 AM

## 2018-03-13 ENCOUNTER — Encounter: Payer: Self-pay | Admitting: Cardiology

## 2018-03-13 ENCOUNTER — Ambulatory Visit (INDEPENDENT_AMBULATORY_CARE_PROVIDER_SITE_OTHER): Payer: Medicare Other | Admitting: Cardiology

## 2018-03-13 VITALS — BP 118/68 | HR 64 | Ht 66.0 in | Wt 152.2 lb

## 2018-03-13 DIAGNOSIS — E1169 Type 2 diabetes mellitus with other specified complication: Secondary | ICD-10-CM | POA: Diagnosis not present

## 2018-03-13 DIAGNOSIS — Z79899 Other long term (current) drug therapy: Secondary | ICD-10-CM | POA: Diagnosis not present

## 2018-03-13 DIAGNOSIS — I5042 Chronic combined systolic (congestive) and diastolic (congestive) heart failure: Secondary | ICD-10-CM | POA: Diagnosis not present

## 2018-03-13 DIAGNOSIS — I420 Dilated cardiomyopathy: Secondary | ICD-10-CM

## 2018-03-13 DIAGNOSIS — I472 Ventricular tachycardia, unspecified: Secondary | ICD-10-CM

## 2018-03-13 DIAGNOSIS — E785 Hyperlipidemia, unspecified: Secondary | ICD-10-CM

## 2018-03-13 DIAGNOSIS — I10 Essential (primary) hypertension: Secondary | ICD-10-CM | POA: Diagnosis not present

## 2018-03-13 DIAGNOSIS — I739 Peripheral vascular disease, unspecified: Secondary | ICD-10-CM

## 2018-03-13 DIAGNOSIS — I2510 Atherosclerotic heart disease of native coronary artery without angina pectoris: Secondary | ICD-10-CM | POA: Diagnosis not present

## 2018-03-13 MED ORDER — ATORVASTATIN CALCIUM 40 MG PO TABS: 40.0000 mg | ORAL_TABLET | Freq: Every day | ORAL | 3 refills | Status: DC

## 2018-03-13 NOTE — Progress Notes (Signed)
PCP: Charolett Bumpers, PA-C  [facility]: Ph [phone]; Fax # [phone] ;  Primary Cardiologist: Herbie Baltimore  Electrophysiologist: Ladona Ridgel  Clinic Note: Chief Complaint  Patient presents with  . Follow-up    Doing well  . Cardiomyopathy    Status post ICD for VT  . Coronary Artery Disease    History of CABG; no angina    HPI: [name] [name] is a 72 y.o. male with a PMH below who presents today for 65month f/u for ICM, CAD --initial presentation was sustained ventricular tachycardia with resultant ICD placement in December 2017.  Known CAD- (s/p 3 V CABG) --> diagnosed after his AoBiFem Bypass in Feb 2014 complicated by ischemic VT post-operatively, NEVER had any symptoms of Angina --> Cath (this was @ [facility] --> relook Cath in July 2014.   Post-op CABG was complicated by Sternal Wound Infxn --> redo-sternotomy with Steel-Plate insertion (on lifelong Dicloxacillin).  After his CABG, he was lost to Cardiology F/u (by [facility] report, [facility] with [facility]).  December 2017:  Admitted to [facility] --> His presenting symptoms were more consistent with heart failure exacerbation and possible unstable angina.   Echocardiogram revealed EF of roughly 20% & he had sustained VT on monitor (actually the cause for CP --> CATH with no no culprit lesion.  Status post ICD placement by Dr. Ladona Ridgel December 2017; remains on Amiodarone.  Unable to convert to [facility] b/c not covered by [place] (*despite guideline direction), Valsartan -> Losartan & now back to Valsartan.  [name] [name] was last seen in Dec 2018 by both me & Dr. Ladona Ridgel.  -Well.  Is felt a little bit tired and weak if he is on his feet for long period time.  He says he gives more because of his legs and hips give out before he gets short of breath or his chest tightness.  If he walks slower, he does better.  He does better if he walks with a cane because of balance issues.  Really class I-II heart  failure. No EP issues with Dr. Lawernce Pitts working well.    Recent Hospitalizations: None  Studies Personally Reviewed - (if available, images/films reviewed: From Epic Chart or Care Everywhere)  LEA Dopplers Jan 2019: Dopplers from the right lower extremity look normal.  Left common femoral artery has 50-74% (moderate stenosis. Otherwise the rest remains relatively patent  AAA Duplex Jan 2019: Patent Aorto-Bifem Bypass - No residual AAA.  Interval History: [name] [name] returns today remarkably doing well.  Despite his reduced EF, he is not having any classic heart failure symptoms.  May be class I-II at worst.  He does not complain of PND, orthopnea or edema.  He does not have any anginal symptoms with rest or exertion.  He is pretty active.  More limited by his legs giving out and poor balance than related to dyspnea.  He does better walking inside can use shopping carts to leave him.  He also enjoys walking inside because it is cooler than outside. He says that he is doing pretty well with his fluid status saying that the he is able to keep his weights within 100 pounds and is not really had any issues with the OptiVol from his ICD.  Maybe once or twice in the last couple months he has had to take an extra dose of Lasix. He is bounce back and forth between valsartan and losartan then back to valsartan again, and his fontanelle is stable regimen.  He does not have any resting or exertional angina symptoms. He has not had any rapid irregular heartbeats palpitations.  Nothing like he had his first heart failure and chest discomfort similar to his admitting symptoms back in 2017. No PND, orthopnea or edema.  No syncope/near syncope or TIA/amaurosis fugax  Pain close attention to everything he eats and his weights etc.  He has not had to make any adjustments to his Lasix now and is quite happy with that.  No cough, wheezing or abnormal breathing issues.  ROS: A comprehensive was performed. Review of  Systems  Constitutional: Negative for malaise/fatigue.  HENT: Negative for congestion and sinus pain.   Respiratory: Negative for shortness of breath.   Cardiovascular: Negative for claudication (Not sure if this is true claudication or disbalance).       Per history of present illness  Gastrointestinal: Negative for constipation, heartburn, nausea and vomiting.  Genitourinary: Negative for dysuria and urgency.  Musculoskeletal: Positive for joint pain (Normal arthritis pains).  Neurological: Positive for weakness (Leg fatigue and weakness with prolonged walking). Negative for dizziness.  Endo/Heme/Allergies: Negative for environmental allergies.  Psychiatric/Behavioral: Negative.   All other systems reviewed and are negative.   I have reviewed and (if needed) personally updated the patient's problem list, medications, allergies, past medical and surgical history, social and family history.   Past Medical History:  Diagnosis Date  . Acid reflux   . AICD (automatic cardioverter/defibrillator) present    a. 08/2016 St. Jude (serial Number [id number]) biventricular ICD.  [name] Chronic systolic CHF (congestive heart failure) (HCC)    a. 08/2016 Echo: EF 20%, diffuse HK, inf, post AK, mod-sev MR, sev dil LA, mod TR, PASP .  [name] Complication of anesthesia    "was told I responded well to anesthesia"  . Coronary artery disease involving native coronary artery of native heart with angina pectoris (HCC)    a. 10/2012 MI after aortobifem bypass, found to have multivessel CAD -->CABG x3;  b. 08/2016 Cath: LM 60, LAD 10m, LCX 60ost/p, RCA 100p, VG->RPDA 50p, VG->OM1 ok, LIMA->LAD ok, EF 25%.  . Essential hypertension 03/03/2012  . History of blood transfusion 11/2012   "during his CABG"  . History of gout X 1  . History of stomach ulcers 1970s  . Hyperlipidemia associated with type 2 diabetes mellitus (HCC) 09/16/2011  . Ischemic cardiomyopathy    a. 08/2016 Echo: EF 20%, diffuse HK, inf, post AK;   b. 08/2016 St. Jude (serial Number [id number]) biventricular ICD.  [name] PAD (peripheral artery disease) (HCC)    s/p aortobifemoral bypass 10/2012  . PTSD (post-traumatic stress disorder)   . Type II diabetes mellitus (HCC)   . Ventricular tachycardia (HCC)    a. 08/2016 St. Jude (serial Number [id number]) biventricular ICD-->oral amio added.  . 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 Is now on chronic suppressive antibiotics with dicloxacillin    Past Surgical History:  Procedure Laterality Date  . AORTO-FEMORAL BYPASS GRAFT Bilateral 10/2012   [facility]  . BI-VENTRICULAR IMPLANTABLE CARDIOVERTER DEFIBRILLATOR  (CRT-D)  09/03/2016  . CARDIAC CATHETERIZATION  11/2012   [facility] (? for abnormal Nuclear ST) --> no PCI, by report, patent grafts.  [name] CARDIAC CATHETERIZATION N/A 09/02/2016   Procedure: Left Heart Cath and Cors/Grafts Angiography;  Surgeon: Kathleene Hazel, MD;  Location: [facility] INVASIVE CV LAB;  Service: Cardiovascular;  Laterality: N/A;  . CARDIAC  CATHETERIZATION  10/2012; 03/2013  . CHOLECYSTECTOMY  03/05/2012   Procedure: LAPAROSCOPIC CHOLECYSTECTOMY WITH INTRAOPERATIVE CHOLANGIOGRAM;  Surgeon: Emelia Loron, MD;  Location: [facility] OR;  Service: General;  Laterality: N/A;  . CORONARY ARTERY BYPASS GRAFT  11/2012   CABG X 3;  [facility]  . EP IMPLANTABLE DEVICE N/A 09/03/2016   Procedure: BiV ICD Insertion CRT-D;  Surgeon: Marinus Maw, MD;  Location: [facility] INVASIVE CV LAB;  Service: Cardiovascular;  Laterality: N/A;  . INGUINAL HERNIA REPAIR Left 1990s  . STERNOTOMY  2015   re-do sternotomy with metal place "sheild" placed. -Has chronic staph infection, on chronic suppressive medication  . TRANSTHORACIC ECHOCARDIOGRAM  02/28/2017   EF remains 20p-25% severely reduced function. Diffuse hypokinesis. "Grade 1 diastolic dysfunction ". Severely calcified aortic  valve with restricted mobility (functioning bicuspid) however no suggestion of stenosis.  [name] UMBILICAL HERNIA REPAIR  03/05/2012   Procedure: HERNIA REPAIR UMBILICAL ADULT;  Surgeon: Emelia Loron, MD;  Location: [facility] OR;  Service: General;  Laterality: N/A;   Cath 08/2016.     Current Meds  Medication Sig  . albuterol (PROVENTIL HFA) 108 (90 Base) MCG/ACT inhaler Inhale 1-2 puffs into the lungs every 6 (six) hours as needed for wheezing or shortness of breath.  [name] amiodarone (PACERONE) 200 MG tablet 2 tabs twice daily x 1 week, then 1 tab twice daily x 2 wks, then 1 tab daily.  [name] aspirin EC 81 MG tablet Take 81 mg by mouth daily.    . carvedilol (COREG) 6.25 MG tablet Take 1 tablet (6.25 mg total) by mouth 2 (two) times daily with a meal.  . Cholecalciferol (VITAMIN D-3 PO) Take 2,000 mg by mouth daily.   . dicloxacillin (DYNAPEN) 250 MG capsule Take 500 mg by mouth 3 (three) times daily.  . fluticasone (FLONASE ALLERGY RELIEF) 50 MCG/ACT nasal spray Place 2 sprays into both nostrils daily as needed for allergies or rhinitis.  . furosemide (LASIX) 40 MG tablet Take 1 tablet (40 mg total) by mouth daily.  [name] glipiZIDE (GLUCOTROL) 5 MG tablet Take 2.5 mg by mouth daily before breakfast.  . magnesium oxide (MAG-OX) 400 (241.3 Mg) MG tablet Take 1 tablet (400 mg total) by mouth daily.  . metFORMIN (GLUCOPHAGE) 1000 MG tablet Take 1 tablet (1,000 mg total) by mouth 2 (two) times daily with a meal. Resume on 09/06/2016  . nitroGLYCERIN (NITROSTAT) 0.4 MG SL tablet Place 1 tablet (0.4 mg total) under the tongue every 5 (five) minutes x 3 doses as needed for chest pain.  [name] omeprazole (PRILOSEC) 20 MG capsule Take 20 mg by mouth daily.  . ranitidine (ZANTAC) 150 MG tablet Take 150 mg by mouth daily as needed for heartburn.   . Tiotropium Bromide Monohydrate (SPIRIVA HANDIHALER IN) Inhale 18 mcg into the lungs daily.  . [DISCONTINUED] atorvastatin (LIPITOR) 80 MG tablet Take 40 mg by mouth at  bedtime.  Also taking valsartan 80 mg daily  No Known Allergies  Social History   Tobacco Use  . Smoking status: Current Every Day Smoker    Types: Cigars, Cigarettes  . Smokeless tobacco: Former Neurosurgeon    Types: Snuff, Chew  . Tobacco comment: 09/03/2016 "used smokeless tobacco in my teens; quit smoking cigarettes in the early 1980s; smokes pipe only"  Substance Use Topics  . Alcohol use: No  . Drug use: No   Social History   Social History Narrative  . Not on file   --He says he smokes a pipe, but this mostly  consist of lighting up the pipe and smelling the back of burn.  He barely inhales once or twice just to get it going now.  family history includes Cancer in his brother.  Wt Readings from Last 3 Encounters:  03/13/18 152 lb 3.2 oz (69 kg)  09/14/17 158 lb 12.8 oz (72 kg)  09/05/17 155 lb (70.3 kg)    PHYSICAL EXAM BP 118/68   Pulse 64   Ht 5\' 6"  (1.676 m)   Wt 152 lb 3.2 oz (69 kg)   BMI 24.57 kg/m   Physical Exam  Constitutional: He is oriented to person, place, and time. He appears well-developed and well-nourished. No distress.  HENT:  Head: Normocephalic and atraumatic.  Mouth/Throat: No oropharyngeal exudate.  Very poor dentition  Neck: Normal range of motion. Neck supple. No hepatojugular reflux and no JVD present. Carotid bruit is not present.  Cardiovascular: Normal rate, regular rhythm and intact distal pulses.  Occasional extrasystoles are present. PMI is not displaced. Exam reveals gallop (Soft S4). Exam reveals no friction rub.  No murmur heard. Pulmonary/Chest: Effort normal and breath sounds normal. No respiratory distress. He has no wheezes.  Left midlung field has some mild rhonchi that clears with cough  Abdominal: Soft. Bowel sounds are normal. He exhibits no distension. There is no tenderness. There is no rebound.  No HSM  Musculoskeletal: Normal range of motion. He exhibits edema (Minimal).  Neurological: He is alert and oriented to  person, place, and time.  Psychiatric: He has a normal mood and affect. His behavior is normal. Judgment and thought content normal.  Nursing note and vitals reviewed.    Adult ECG Report N/a  Other studies Reviewed: Additional studies/ records that were reviewed today include:  Recent Labs:  Labs [facility] by [facility] -they were just checked recently, but not available.  Lab Results  Component Value Date   CHOL 87 09/02/2016   HDL 26 (L) 09/02/2016   LDLCALC 43 09/02/2016   TRIG 92 09/02/2016   CHOLHDL 3.3 09/02/2016   Lab Results  Component Value Date   CREATININE 1.08 10/27/2016   BUN 13 10/27/2016   NA 133 (L) 10/27/2016   K 4.9 10/27/2016   CL 94 (L) 10/27/2016   CO2 26 10/27/2016     ASSESSMENT / PLAN: Problem List Items Addressed This Visit    Ventricular tachycardia (HCC) (Chronic)    Appears to be maintaining sinus rhythm with amiodarone.  ICD in place.      Relevant Medications   atorvastatin (LIPITOR) 40 MG tablet   Other Relevant Orders   EKG 12-Lead (Completed)   Pulmonary Function Test   PAD (peripheral artery disease) (HCC) (Chronic)    We checked his lower extremity  arterial and AAA Dopplers recently.  They can be followed up after next visit.      Relevant Medications   atorvastatin (LIPITOR) 40 MG tablet   Long term current use of amiodarone (Chronic)    Due for PFT evaluation which will check now prior to next visit.  Needs annual eye exams as well as LFTs and thyroid function test.      Relevant Orders   EKG 12-Lead (Completed)   Pulmonary Function Test   Hyperlipidemia associated with type 2 diabetes mellitus (HCC) (Chronic)    Lipids back in 2017 looked great.  I would reduce him statin has been reduced to 40 mg daily now. -- >[facility],  Not available.  Relevant Medications   atorvastatin (LIPITOR) 40 MG tablet   Essential hypertension (Chronic)    Control pressures on beta-blocker and ARB.       Relevant Medications   atorvastatin (LIPITOR) 40 MG tablet   Coronary artery disease involving native coronary artery of native heart without angina pectoris (Chronic)    He has multivessel disease with CABG, almost all native vessels are occluded, but patent grafts.. I do not really know what his EF previously was, but he now has a significantly reduced EF of 20 to 25% and is status post ICD for VT.  He has no active anginal symptoms.  He is on aspirin statin and beta-blocker with PRN nitroglycerin.      Relevant Medications   atorvastatin (LIPITOR) 40 MG tablet   Other Relevant Orders   EKG 12-Lead (Completed)   Pulmonary Function Test   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, but Also Potentially Arrhythmogenic - Primary (Chronic)    Despite medical management, his EF is not really improved.  He is maintaining sinus rhythm with amiodarone.  But I imagine he has had long-standing ischemic cardia myopathy then we recently noted probably from his original CAD. He is euvolemic and maintaining stable body weight.  He is on carvedilol 6.25 mg twice daily and valsartan 80 mg at stable doses now with standing dose of Lasix.  Using sliding scale.      Relevant Medications   atorvastatin (LIPITOR) 40 MG tablet   Other Relevant Orders   EKG 12-Lead (Completed)   Pulmonary Function Test   Chronic combined systolic and diastolic CHF, NYHA class 2 and ACA/AHA stage C (HCC) (Chronic)    Minimal CHF symptoms.  Class I to II at worst.. He is maintaining Sitting dry weight indicating euvolemia.  This goes along with his Thoracic impedance measured by hisICD. On ARB and carvedilol (not on Entresto because it was not covered by the [place] despite guideline recommendations). On standing dose of Lasix with sliding scale.      Relevant Medications   atorvastatin (LIPITOR) 40 MG tablet      Current medicines are reviewed at length with the patient today. (+/- concerns) n/a The  following changes have been made: n/a  Patient Instructions  NO MEDICATION CHANGES    SCHEDULE AT [facility] Your physician has recommended that you have a pulmonary function test. Pulmonary Function Tests are a group of tests that measure how well air moves in and out of your lungs.   WILL TRY TO OBTAIN LABS FROM [facility]   Your physician wants you to follow-up in 6 MONTHS WITH DR Katlin Ciszewski. You will receive a reminder letter in the mail two months in advance. If you don't receive a letter, please call our office to schedule the follow-up appointment.   If you need a refill on your cardiac medications before your next appointment, please call your pharmacy.    Studies Ordered:   Orders Placed This Encounter  Procedures  . EKG 12-Lead  . Pulmonary Function Test      Bryan Lemma, M.D., M.S. Interventional Cardiologist   Pager # [phone] Phone # [phone] [address] [place], [place] [address]

## 2018-03-13 NOTE — Patient Instructions (Signed)
NO MEDICATION CHANGES    SCHEDULE AT [facility] Your physician has recommended that you have a pulmonary function test. Pulmonary Function Tests are a group of tests that measure how well air moves in and out of your lungs.   WILL TRY TO OBTAIN LABS FROM [facility]   Your physician wants you to follow-up in 6 MONTHS WITH DR HARDING. You will receive a reminder letter in the mail two months in advance. If you don't receive a letter, please call our office to schedule the follow-up appointment.   If you need a refill on your cardiac medications before your next appointment, please call your pharmacy.

## 2018-03-16 ENCOUNTER — Encounter: Payer: Self-pay | Admitting: Cardiology

## 2018-03-16 NOTE — Assessment & Plan Note (Signed)
Despite medical management, his EF is not really improved.  He is maintaining sinus rhythm with amiodarone.  But I imagine he has had long-standing ischemic cardia myopathy then we recently noted probably from his original CAD. He is euvolemic and maintaining stable body weight.  He is on carvedilol 6.25 mg twice daily and valsartan 80 mg at stable doses now with standing dose of Lasix.  Using sliding scale.

## 2018-03-16 NOTE — Assessment & Plan Note (Signed)
Control pressures on beta-blocker and ARB.

## 2018-03-16 NOTE — Assessment & Plan Note (Addendum)
Lipids back in 2017 looked great.  I would reduce him statin has been reduced to 40 mg daily now. -- >[facility],  Not available.

## 2018-03-16 NOTE — Assessment & Plan Note (Signed)
He has multivessel disease with CABG, almost all native vessels are occluded, but patent grafts.. I do not really know what his EF previously was, but he now has a significantly reduced EF of 20 to 25% and is status post ICD for VT.  He has no active anginal symptoms.  He is on aspirin statin and beta-blocker with PRN nitroglycerin.

## 2018-03-16 NOTE — Assessment & Plan Note (Signed)
Appears to be maintaining sinus rhythm with amiodarone.  ICD in place.

## 2018-03-16 NOTE — Assessment & Plan Note (Signed)
Minimal CHF symptoms.  Class I to II at worst.. He is maintaining Sitting dry weight indicating euvolemia.  This goes along with his Thoracic impedance measured by hisICD. On ARB and carvedilol (not on Entresto because it was not covered by the [place] despite guideline recommendations). On standing dose of Lasix with sliding scale.

## 2018-03-16 NOTE — Assessment & Plan Note (Signed)
We checked his lower extremity  arterial and AAA Dopplers recently.  They can be followed up after next visit.

## 2018-03-16 NOTE — Assessment & Plan Note (Signed)
Due for PFT evaluation which will check now prior to next visit.  Needs annual eye exams as well as LFTs and thyroid function test.

## 2018-03-20 ENCOUNTER — Ambulatory Visit (HOSPITAL_COMMUNITY)
Admission: RE | Admit: 2018-03-20 | Discharge: 2018-03-20 | Disposition: A | Discharge status: Home / Self Care | Payer: Medicare Other | Source: Ambulatory Visit | Attending: Cardiology | Admitting: Cardiology

## 2018-03-20 DIAGNOSIS — I472 Ventricular tachycardia, unspecified: Secondary | ICD-10-CM

## 2018-03-20 DIAGNOSIS — I2510 Atherosclerotic heart disease of native coronary artery without angina pectoris: Secondary | ICD-10-CM | POA: Diagnosis not present

## 2018-03-20 DIAGNOSIS — Z79899 Other long term (current) drug therapy: Secondary | ICD-10-CM | POA: Insufficient documentation

## 2018-03-20 DIAGNOSIS — I420 Dilated cardiomyopathy: Secondary | ICD-10-CM

## 2018-03-20 LAB — PULMONARY FUNCTION TEST
DL/VA % pred: 55 %
DL/VA: 2.4 ml/min/mmHg/L
DLCO UNC: 12.74 ml/min/mmHg
DLCO unc % pred: 47 %
FEF 25-75 Post: 1.33 L/sec
FEF 25-75 Pre: 1.03 L/sec
FEF2575-%Change-Post: 28 %
FEF2575-%Pred-Post: 63 %
FEF2575-%Pred-Pre: 49 %
FEV1-%CHANGE-POST: 9 %
FEV1-%PRED-PRE: 70 %
FEV1-%Pred-Post: 77 %
FEV1-POST: 2.12 L
FEV1-Pre: 1.95 L
FEV1FVC-%Change-Post: 1 %
FEV1FVC-%Pred-Pre: 85 %
FEV6-%CHANGE-POST: 7 %
FEV6-%PRED-POST: 91 %
FEV6-%Pred-Pre: 85 %
FEV6-POST: 3.24 L
FEV6-PRE: 3.01 L
FEV6FVC-%CHANGE-POST: 0 %
FEV6FVC-%PRED-POST: 103 %
FEV6FVC-%PRED-PRE: 103 %
FVC-%Change-Post: 7 %
FVC-%PRED-POST: 88 %
FVC-%Pred-Pre: 82 %
FVC-Post: 3.32 L
FVC-Pre: 3.09 L
PRE FEV6/FVC RATIO: 98 %
Post FEV1/FVC ratio: 64 %
Post FEV6/FVC ratio: 98 %
Pre FEV1/FVC ratio: 63 %
RV % PRED: 106 %
RV: 2.38 L
TLC % PRED: 93 %
TLC: 5.81 L

## 2018-03-20 MED ORDER — ALBUTEROL SULFATE (2.5 MG/3ML) 0.083% IN NEBU
2.5000 mg | INHALATION_SOLUTION | Freq: Once | RESPIRATORY_TRACT | Status: AC
  Administered 2018-03-20: 2.5 mg via RESPIRATORY_TRACT

## 2018-03-21 ENCOUNTER — Telehealth: Payer: Self-pay | Admitting: *Deleted

## 2018-03-21 DIAGNOSIS — J449 Chronic obstructive pulmonary disease, unspecified: Secondary | ICD-10-CM

## 2018-03-21 DIAGNOSIS — R942 Abnormal results of pulmonary function studies: Secondary | ICD-10-CM

## 2018-03-21 DIAGNOSIS — I2510 Atherosclerotic heart disease of native coronary artery without angina pectoris: Secondary | ICD-10-CM

## 2018-03-21 DIAGNOSIS — Z79899 Other long term (current) drug therapy: Secondary | ICD-10-CM

## 2018-03-21 DIAGNOSIS — E119 Type 2 diabetes mellitus without complications: Secondary | ICD-10-CM

## 2018-03-21 NOTE — Telephone Encounter (Signed)
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  The diffusion defect is worse than last year at baseline. We really cannot tell if this just progression of underlying disease or if it could potentially be related to amiodarone.  Plan: Check blood - ESR, CRP --markers of inflammation.  If there is truly inflammation, then we may need to reassess whether or not we use amiodarone.  I also would like to make sure that [name] [name] has been referred to pulmonary medicine, if he does not already have a pulmonologist.  Glenetta Hew, MD

## 2018-03-21 NOTE — Telephone Encounter (Signed)
Spoke to patient. Result given . Verbalized understanding Aware results are in [place] Order for referral and labs done. Patient aware to come to office.

## 2018-03-22 DIAGNOSIS — I2510 Atherosclerotic heart disease of native coronary artery without angina pectoris: Secondary | ICD-10-CM | POA: Diagnosis not present

## 2018-03-22 DIAGNOSIS — Z79899 Other long term (current) drug therapy: Secondary | ICD-10-CM | POA: Diagnosis not present

## 2018-03-22 DIAGNOSIS — J449 Chronic obstructive pulmonary disease, unspecified: Secondary | ICD-10-CM | POA: Diagnosis not present

## 2018-03-22 DIAGNOSIS — E119 Type 2 diabetes mellitus without complications: Secondary | ICD-10-CM | POA: Diagnosis not present

## 2018-03-22 DIAGNOSIS — R942 Abnormal results of pulmonary function studies: Secondary | ICD-10-CM | POA: Diagnosis not present

## 2018-03-23 LAB — C-REACTIVE PROTEIN: CRP: 7 mg/L (ref 0–10)

## 2018-03-23 LAB — SEDIMENTATION RATE: SED RATE: 2 mm/h (ref 0–30)

## 2018-03-28 ENCOUNTER — Ambulatory Visit (INDEPENDENT_AMBULATORY_CARE_PROVIDER_SITE_OTHER): Payer: Medicare Other | Admitting: Pulmonary Disease

## 2018-03-28 ENCOUNTER — Encounter: Payer: Self-pay | Admitting: Pulmonary Disease

## 2018-03-28 VITALS — BP 126/84 | HR 66 | Ht 66.0 in | Wt 155.6 lb

## 2018-03-28 DIAGNOSIS — R942 Abnormal results of pulmonary function studies: Secondary | ICD-10-CM

## 2018-03-28 DIAGNOSIS — Z79899 Other long term (current) drug therapy: Secondary | ICD-10-CM

## 2018-03-28 DIAGNOSIS — J439 Emphysema, unspecified: Secondary | ICD-10-CM

## 2018-03-28 DIAGNOSIS — I2510 Atherosclerotic heart disease of native coronary artery without angina pectoris: Secondary | ICD-10-CM | POA: Diagnosis not present

## 2018-03-28 NOTE — Progress Notes (Signed)
   Subjective:    Patient ID: [name] [name], male    DOB: [birth year], 72 y.o.   MRN: [id number]  HPI    Review of Systems  Constitutional: Negative for fever and unexpected weight change.  HENT: Negative for congestion, dental problem, ear pain, nosebleeds, postnasal drip, rhinorrhea, sinus pressure, sneezing, sore throat and trouble swallowing.   Eyes: Negative for redness and itching.  Respiratory: Negative for cough, chest tightness, shortness of breath and wheezing.   Cardiovascular: Negative for palpitations and leg swelling.  Gastrointestinal: Negative for nausea and vomiting.  Genitourinary: Negative for dysuria.  Musculoskeletal: Negative for joint swelling.  Skin: Negative for rash.  Allergic/Immunologic: Negative.  Negative for environmental allergies, food allergies and immunocompromised state.  Neurological: Negative for headaches.  Hematological: Does not bruise/bleed easily.  Psychiatric/Behavioral: Negative for dysphoric mood. The patient is not nervous/anxious.        Objective:   Physical Exam        Assessment & Plan:

## 2018-03-28 NOTE — Progress Notes (Signed)
[facility]  Chief Complaint  Patient presents with  . pulm consult    Pt had abnormal PFT at [facility] on 03-20-2018 by Dr. Herbie Baltimore MD.     Constitutional: BP 126/84 (BP Location: Left Arm, Cuff Size: Normal)   Pulse 66   Ht 5\' 6"  (1.676 m)   Wt 155 lb 9.6 oz (70.6 kg)   SpO2 97%   BMI 25.11 kg/m   History of Present Illness: [name] [name] is a 72 y.o. male smoker with abnormal PFT.  He is followed by Dr. Herbie Baltimore for CHF, and a fib.  He is on amiodarone.  He had PFT in 2018 and 2019.  Each showed diffusion defect with progressive decline.  He has also been told he has COPD, but wasn't aware of having emphysema.  He still smokes a pipe.  He was in [employer] from [place] to 1970, and stationed in [place].  He had amiodarone exposure.  He worked in [employer] for 15 yrs after this.  He had exposure to chemicals and asbestos.  He keeps up with most activities.  He has a cough in the morning with clear sputum.  He gets occasional sinus congestion when he works outside.  No history of asthma, pneumonia, or TB.  He has been using spiriva for the past 1 year and this helps.  He uses albuterol one or two times per week.   Comprehensive Respiratory Exam:  Appearance - well kempt  ENMT - nasal mucosa moist, turbinates clear, mild deviated nasal septum, wears dentures, no gingival bleeding, no oral exudates, no tonsillar hypertrophy Neck - no masses, trachea midline, no thyromegaly, no elevation in JVP Respiratory - normal appearance of chest wall, normal respiratory effort w/o accessory muscle use, no dullness on percussion, no tactile fremitus, no wheezing or rales CV - s1s2 regular rate and rhythm, 2/6 systolic murmurs, no peripheral edema, no varicosities, radial pulses symmetric GI - soft, non tender, no masses, no hepatosplenomegaly Lymph - no adenopathy noted in neck and axillary areas MSK - normal muscle strength and tone, uses a cane Ext -  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  He is on amiodarone for arrhythmia.  His PFT shows obstruction, air trapping, and progressive decline in diffusion capacity.  Assessment/Plan:  COPD. 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 - if no evidence for ILD on CT chest, then should be okay to continue amiodarone  Pipe smoker. - discussed importance of smoking cessation   Patient Instructions  Will schedule high resolution CT chest  Follow up in 3 months    [name], MD [facility] 03/28/2018, 2:05 PM  Flow Sheet  Pulmonary tests: PFT 09/22/16 >> FEV1 1.62 (58%), FEV1% 58, TLC 6.68 (107%), DLCO 53%, increased RV:TLC PFT 03/20/18 >> FEV1 2.12 (77%), FEV1% 64, TLC 5.81 (93%), DLCO 47%  Cardiac tests: Echo 02/28/17 mild LVH, EF 20 to 25%, grade 1 DD, bicuspid AV  Review of Systems: Constitutional: Negative for fever and unexpected weight change.  HENT: Negative for congestion, dental problem, ear pain, nosebleeds, postnasal drip, rhinorrhea, sinus pressure, sneezing, sore throat and trouble swallowing.  Eyes: Negative for redness and itching.  Respiratory: Negative for cough, chest tightness, shortness of breath and wheezing.   Cardiovascular: Negative for palpitations and leg swelling.  Gastrointestinal: Negative for nausea and vomiting.  Genitourinary: Negative for dysuria.  Musculoskeletal: Negative for joint swelling.  Skin: Negative for rash.  Allergic/Immunologic: Negative.  Negative for environmental allergies, food allergies and immunocompromised state.  Neurological: Negative for headaches.    Hematological: Does not bruise/bleed easily.  Psychiatric/Behavioral: Negative for dysphoric mood. The patient is not nervous/anxious.    Past Medical History: He  has a past medical history of Acid reflux, AICD (automatic cardioverter/defibrillator) present, Chronic systolic CHF (congestive heart failure) (HCC), Complication of anesthesia, Coronary artery disease involving native coronary artery of native heart with angina pectoris (HCC), Essential hypertension (03/03/2012), History of blood transfusion (11/2012), History of gout (X 1), History of stomach ulcers (1970s), Hyperlipidemia associated with type 2 diabetes mellitus (HCC) (09/16/2011), Ischemic cardiomyopathy, PAD (peripheral artery disease) (HCC), PTSD (post-traumatic stress disorder), Type II diabetes mellitus (HCC), Ventricular tachycardia (HCC), and Wound infection after surgery, sequela (2014-2015).  Past Surgical History: He  has a past surgical history that includes Cholecystectomy (03/05/2012); Umbilical hernia repair (03/05/2012); Aorto-femoral Bypass Graft (Bilateral, 10/2012); Bi-ventricular implantable cardioverter defibrillator  (crt-d) (09/03/2016); Inguinal hernia repair (Left, 1990s); Sternotomy (2015); Coronary artery bypass graft (11/2012); Cardiac catheterization (11/2012); Cardiac catheterization (N/A, 09/02/2016); Cardiac catheterization (10/2012; 03/2013); Cardiac catheterization (N/A, 09/03/2016); and transthoracic echocardiogram (02/28/2017).  Family History: His family history includes Cancer in his brother.  Social History: He  reports that he has been smoking pipe.  He has quit using smokeless tobacco. His smokeless tobacco use included snuff and chew. He reports that he does not drink alcohol or use drugs.  Medications: Allergies as of 03/28/2018   No Known Allergies     Medication List        Accurate as of 03/28/18  2:05 PM. Always use your most recent med list.          amiodarone 200 MG tablet Commonly  known as:  PACERONE 2 tabs twice daily x 1 week, then 1 tab twice daily x 2 wks, then 1 tab daily.   aspirin EC 81 MG tablet Take 81 mg by mouth daily.   atorvastatin 40 MG tablet Commonly known as:  LIPITOR Take 1 tablet (40 mg total) by mouth daily.   carvedilol 6.25 MG tablet Commonly known as:  COREG Take 1 tablet (6.25 mg total) by mouth 2 (two) times daily with a meal.   dicloxacillin 250 MG capsule Commonly known as:  DYNAPEN Take 500 mg by mouth 3 (three) times daily.   FLONASE ALLERGY RELIEF 50 MCG/ACT nasal spray Generic drug:  fluticasone Place 2 sprays into both nostrils daily as needed for allergies or rhinitis.   furosemide 40 MG tablet Commonly known as:  LASIX Take 1 tablet (40 mg total) by mouth daily.   glipiZIDE 5 MG tablet Commonly known as:  GLUCOTROL Take 2.5 mg by mouth daily before breakfast.   magnesium oxide 400 (241.3 Mg) MG tablet Commonly known as:  MAG-OX Take 1 tablet (400 mg total) by mouth daily.   metFORMIN 1000 MG tablet Commonly known as:  GLUCOPHAGE Take 1 tablet (1,000 mg total) by mouth 2 (two) times daily with a meal. Resume on 09/06/2016   nitroGLYCERIN 0.4 MG SL tablet Commonly known as:  NITROSTAT Place 1 tablet (0.4 mg total) under the tongue every 5 (five) minutes x 3 doses as needed for chest pain.  omeprazole 20 MG capsule Commonly known as:  PRILOSEC Take 20 mg by mouth daily.   PROVENTIL HFA 108 (90 Base) MCG/ACT inhaler Generic drug:  albuterol Inhale 1-2 puffs into the lungs every 6 (six) hours as needed for wheezing or shortness of breath.   ranitidine 150 MG tablet Commonly known as:  ZANTAC Take 150 mg by mouth daily as needed for heartburn.   SPIRIVA HANDIHALER IN Inhale 18 mcg into the lungs daily.   VITAMIN D-3 PO Take 2,000 mg by mouth daily.

## 2018-03-28 NOTE — Patient Instructions (Signed)
Will schedule high resolution CT chest  Follow up in 3 months

## 2018-04-06 ENCOUNTER — Ambulatory Visit (INDEPENDENT_AMBULATORY_CARE_PROVIDER_SITE_OTHER): Payer: Medicare Other

## 2018-04-06 DIAGNOSIS — Z95810 Presence of automatic (implantable) cardiac defibrillator: Secondary | ICD-10-CM

## 2018-04-06 DIAGNOSIS — I5042 Chronic combined systolic (congestive) and diastolic (congestive) heart failure: Secondary | ICD-10-CM

## 2018-04-07 ENCOUNTER — Ambulatory Visit (INDEPENDENT_AMBULATORY_CARE_PROVIDER_SITE_OTHER)
Admission: RE | Admit: 2018-04-07 | Discharge: 2018-04-07 | Disposition: A | Discharge status: Home / Self Care | Payer: Medicare Other | Source: Ambulatory Visit | Attending: Pulmonary Disease | Admitting: Pulmonary Disease

## 2018-04-07 DIAGNOSIS — J439 Emphysema, unspecified: Secondary | ICD-10-CM

## 2018-04-07 NOTE — Progress Notes (Signed)
EPIC Encounter for ICM Monitoring  Patient Name: [name] [name] is a 72 y.o. male Date: 04/07/2018 Primary Care Physican: Charolett Bumpers, PA-C Primary Cardiologist: Herbie Baltimore  Electrophysiologist: Ladona Ridgel Dry Weight: 151lbs  Bi-V Pacing: >99%      Heart Failure questions reviewed, pt asymptomatic.  He reported he is feeling fine today.   Advised to increase fluids up to 64 oz since report suggests dryness   Thoracic impedance normal but is above baseline suggesting dryness.  Prescribed dosage: Furosemide 40 mg 1 tablet daily. Per Dr Herbie Baltimore note 03/13/2018,dicussed use of sliding scale Lasix.   Labs: 10/27/2016 Creatinine 1.08, BUN 13, Potassium 4.9, Sodium 133  Recommendations: No changes.   Encouraged to call for fluid symptoms.  Follow-up plan: ICM clinic phone appointment on 05/08/2018.   Office appointment scheduled 09/04/2018 with Dr. Herbie Baltimore.    Copy of ICM check sent to Dr. Ladona Ridgel.   3 month ICM trend: 04/06/2018    1 Year ICM trend:       Karie Soda, RN 04/07/2018 10:22 AM

## 2018-04-11 ENCOUNTER — Telehealth: Payer: Self-pay | Admitting: Pulmonary Disease

## 2018-04-11 DIAGNOSIS — J449 Chronic obstructive pulmonary disease, unspecified: Secondary | ICD-10-CM

## 2018-04-11 NOTE — Telephone Encounter (Signed)
HRCT chest 04/07/18 >> LLL 7 mm nodule, patchy peribronchovascular and subpleural reticulation and GGO b/l   Results d/w pt.    Will need to have additional lab work.  Will have my nurse place order and notify patient when he can get lab work done.   Please place order for following: ANA, ANCA, Rheumatoid Factor, Anti CCP, SSA/SSB, Scl 70, Aldolase, Anti Alvino [place]

## 2018-04-14 NOTE — Telephone Encounter (Signed)
Called and spoke with pt's wife and daughter regarding additional labs needed for pt Advised that pt comes in next week on day m-f 8-5pm for labs. Placed labs as VS recommended today Pt's daughter verbalized understanding, stated that pt will come next week. Nothing further needed at this time.

## 2018-04-17 ENCOUNTER — Other Ambulatory Visit: Payer: Medicare Other

## 2018-04-17 DIAGNOSIS — J449 Chronic obstructive pulmonary disease, unspecified: Secondary | ICD-10-CM

## 2018-04-18 LAB — ANTI-JO 1 ANTIBODY, IGG: Anti JO-1: <0.2 AI (ref 0.0–0.9)

## 2018-04-19 LAB — ANTI-SCLERODERMA ANTIBODY: SCLERODERMA (SCL-70) (ENA) ANTIBODY, IGG: NEGATIVE AI

## 2018-04-19 LAB — MPO/PR-3 (ANCA) ANTIBODIES: Serine Protease 3: <1 AI

## 2018-04-19 LAB — SJOGRENS SYNDROME-A EXTRACTABLE NUCLEAR ANTIBODY: SSA (Ro) (ENA) Antibody, IgG: <1 AI

## 2018-04-19 LAB — RHEUMATOID FACTOR

## 2018-04-19 LAB — CYCLIC CITRUL PEPTIDE ANTIBODY, IGG: Cyclic Citrullin Peptide Ab: <16 UNITS

## 2018-04-19 LAB — ANA: ANA: NEGATIVE

## 2018-04-19 LAB — SJOGRENS SYNDROME-B EXTRACTABLE NUCLEAR ANTIBODY: SSB (LA) (ENA) ANTIBODY, IGG: NEGATIVE AI

## 2018-04-19 LAB — ALDOLASE: ALDOLASE: 4.1 U/L (ref <=8.1)

## 2018-04-20 ENCOUNTER — Telehealth: Payer: Self-pay | Admitting: Pulmonary Disease

## 2018-04-20 ENCOUNTER — Ambulatory Visit (INDEPENDENT_AMBULATORY_CARE_PROVIDER_SITE_OTHER): Payer: Medicare Other | Admitting: *Deleted

## 2018-04-20 ENCOUNTER — Encounter: Payer: Self-pay | Admitting: Cardiology

## 2018-04-20 DIAGNOSIS — I472 Ventricular tachycardia, unspecified: Secondary | ICD-10-CM

## 2018-04-20 NOTE — Telephone Encounter (Signed)
Called and spoke with patient regarding results.  Informed the patient of results and recommendations today. Scheduled appt for [place] tomorrow 04/21/18 for CT chest resutls Pt verbalized understanding and denied any questions or concerns at this time.  Nothing further needed.

## 2018-04-20 NOTE — Progress Notes (Signed)
Remote ICD transmission.   

## 2018-04-20 NOTE — Telephone Encounter (Signed)
Serology 04/17/18 >> ANCA, ANA, RF, anti CCP, SSA/SSB, Scl 70, aldolase, anti Jo all negative   Please let him know that his labs were normal.  Please schedule ROV with me or NP to review recent CT chest findings in more detail.

## 2018-04-21 ENCOUNTER — Encounter: Payer: Self-pay | Admitting: Nurse Practitioner

## 2018-04-21 ENCOUNTER — Ambulatory Visit (INDEPENDENT_AMBULATORY_CARE_PROVIDER_SITE_OTHER): Payer: Medicare Other | Admitting: Nurse Practitioner

## 2018-04-21 VITALS — BP 126/72 | HR 86 | Ht 66.0 in | Wt 153.0 lb

## 2018-04-21 DIAGNOSIS — J849 Interstitial pulmonary disease, unspecified: Secondary | ICD-10-CM

## 2018-04-21 DIAGNOSIS — R911 Solitary pulmonary nodule: Secondary | ICD-10-CM | POA: Diagnosis not present

## 2018-04-21 DIAGNOSIS — J449 Chronic obstructive pulmonary disease, unspecified: Secondary | ICD-10-CM

## 2018-04-21 DIAGNOSIS — R918 Other nonspecific abnormal finding of lung field: Secondary | ICD-10-CM

## 2018-04-21 NOTE — Assessment & Plan Note (Signed)
Discussed CT and recent labs with patient today Follow up CT indicated in 4 months Repeat PFT indicated in 4 months

## 2018-04-21 NOTE — Assessment & Plan Note (Signed)
Discussed CT and recent labs with patient today Follow up CT indicated in 4 months Repeat PFT indicated in 4 months Message sent to Dr. Herbie Baltimore regarding possible change of medication - ? Amiodarone  Will forward note to Dr. Verta Ellen (PCP) per patient request Follow up with Dr. Craige Cotta in 4 months Continue current medications  Discussion: CT findings may indicate an interstitial lung disease and this could be on the basis of amiodarone pulmonary toxicity. The CT also showed three scattered pulmonary nodules, largest 52mm. We will recommend 3 month repeat chest CT for follow up.  Will discuss possibility of discontinuing amiodarone with Dr. Herbie Baltimore - cardiology  Recent labs were normal

## 2018-04-21 NOTE — Progress Notes (Signed)
He has moderate to severe diffusion defect.  CT chest shows scattered nodules, patchy subpleural reticulation and GGO.  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  Serology testing negative.  Have asked [name] to d/w cardiology about whether he can have amiodarone discontinued.    [name], MD [facility] 09/15/2016, 12:24 PM Pager:  [phone]

## 2018-04-21 NOTE — Progress Notes (Signed)
@Patient  ID: [name] [name], male    DOB: Dec 15, 1945, 72 y.o.   MRN: [id number]  Chief Complaint  Patient presents with  . Results    Discuss results from CT and labs    Referring provider: Charolett Bumpers, PA-C  72 year old male followed by [name] [name] here today for CT results Health history: smoker, COPD  HPI Patient presents today for follow up on COPD and recent chest CT results and labs. States that he is doing well. Cough and shortness of breath have been stable for him. Denies any fever, hemoptysis, or appetite changes.   Discussion: CT findings may indicate an interstitial lung disease and this could be on the basis of amiodarone pulmonary toxicity. The CT also showed three scattered pulmonary nodules, largest 73mm. We will recommend 3 month repeat chest CT for follow up.  Will discuss possibility of discontinuing amiodarone with [name] [name] - cardiology  Recent labs were normal  Recent [facility] Encounters:   03-28-18 OV [name] [name] COPD. 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 - if no evidence for ILD on CT chest, then should be okay to continue amiodarone  Pipe smoker. - discussed importance of smoking cessation   Patient Instructions  Will schedule high resolution CT chest  Follow up in 3 months   Tests:  PFT 09/22/16 >> FEV1 1.62 (58%), FEV1% 58, TLC 6.68 (107%), DLCO 53%, increased RV:TLC PFT 03/20/18 >> FEV1 2.12 (77%), FEV1% 64, TLC 5.81 (93%), DLCO 47%  No Known Allergies  Immunization History  Administered Date(s) Administered  . Influenza Split 09/17/2011  . Pneumococcal-Unspecified 11/12/2014    Past Medical History:  Diagnosis Date  . Acid reflux   . AICD (automatic cardioverter/defibrillator) present    a. 08/2016 [name] [name] (serial Number [id number]) biventricular ICD.  [name] Chronic systolic CHF (congestive heart failure) (HCC)    a. 08/2016 Echo: EF 20%, diffuse HK,  inf, post AK, mod-sev MR, sev dil LA, mod TR, PASP .  [name] Complication of anesthesia    "was told I responded well to anesthesia"  . Coronary artery disease involving native coronary artery of native heart with angina pectoris (HCC)    a. 10/2012 MI after aortobifem bypass, found to have multivessel CAD -->CABG x3;  b. 08/2016 Cath: LM 60, LAD 94m, LCX 60ost/p, RCA 100p, VG->RPDA 50p, VG->OM1 ok, LIMA->LAD ok, EF 25%.  . Essential hypertension 03/03/2012  . History of blood transfusion 11/2012   "during his CABG"  . History of gout X 1  . History of stomach ulcers 1970s  . Hyperlipidemia associated with type 2 diabetes mellitus (HCC) 09/16/2011  . Ischemic cardiomyopathy    a. 08/2016 Echo: EF 20%, diffuse HK, inf, post AK;  b. 08/2016 [name] [name] (serial Number [id number]) biventricular ICD.  [name] PAD (peripheral artery disease) (HCC)    s/p aortobifemoral bypass 10/2012  . PTSD (post-traumatic stress disorder)   . Type II diabetes mellitus (HCC)   . Ventricular tachycardia (HCC)    a. 08/2016 [name] [name] (serial Number [id number]) biventricular ICD-->oral amio added.  . 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 Is now on chronic suppressive antibiotics with dicloxacillin    Tobacco History: Social History   Tobacco Use  Smoking Status Current Every Day Smoker  . Types: Pipe  Smokeless Tobacco Former Neurosurgeon  .  Types: Snuff, Chew  Tobacco Comment   09/03/2016 "used smokeless tobacco in my teens; quit smoking cigarettes in the early 1980s; smokes pipe only"   Ready to quit: Not Answered Counseling given: Not Answered Comment: 09/03/2016 "used smokeless tobacco in my teens; quit smoking cigarettes in the early 1980s; smokes pipe only"   Outpatient Encounter Medications as of 04/21/2018  Medication Sig  . albuterol (PROVENTIL HFA) 108 (90 Base) MCG/ACT inhaler Inhale 1-2  puffs into the lungs every 6 (six) hours as needed for wheezing or shortness of breath.  [name] amiodarone (PACERONE) 200 MG tablet 2 tabs twice daily x 1 week, then 1 tab twice daily x 2 wks, then 1 tab daily.  [name] aspirin EC 81 MG tablet Take 81 mg by mouth daily.    [name] atorvastatin (LIPITOR) 40 MG tablet Take 1 tablet (40 mg total) by mouth daily.  . carvedilol (COREG) 6.25 MG tablet Take 1 tablet (6.25 mg total) by mouth 2 (two) times daily with a meal.  . Cholecalciferol (VITAMIN D-3 PO) Take 2,000 mg by mouth daily.   . dicloxacillin (DYNAPEN) 250 MG capsule Take 500 mg by mouth 3 (three) times daily.  . fluticasone (FLONASE ALLERGY RELIEF) 50 MCG/ACT nasal spray Place 2 sprays into both nostrils daily as needed for allergies or rhinitis.  . furosemide (LASIX) 40 MG tablet Take 1 tablet (40 mg total) by mouth daily.  [name] glipiZIDE (GLUCOTROL) 5 MG tablet Take 2.5 mg by mouth daily before breakfast.  . magnesium oxide (MAG-OX) 400 (241.3 Mg) MG tablet Take 1 tablet (400 mg total) by mouth daily.  . metFORMIN (GLUCOPHAGE) 1000 MG tablet Take 1 tablet (1,000 mg total) by mouth 2 (two) times daily with a meal. Resume on 09/06/2016  . nitroGLYCERIN (NITROSTAT) 0.4 MG SL tablet Place 1 tablet (0.4 mg total) under the tongue every 5 (five) minutes x 3 doses as needed for chest pain.  [name] omeprazole (PRILOSEC) 20 MG capsule Take 20 mg by mouth daily.  . ranitidine (ZANTAC) 150 MG tablet Take 150 mg by mouth daily as needed for heartburn.   . Tiotropium Bromide Monohydrate (SPIRIVA HANDIHALER IN) Inhale 18 mcg into the lungs daily.   No facility-administered encounter medications on file as of 04/21/2018.      Review of Systems  Review of Systems  Constitutional: Negative.   HENT: Negative.   Respiratory: Positive for cough (occassional) and shortness of breath (occassional).   Cardiovascular: Negative.   Gastrointestinal: Negative.   Allergic/Immunologic: Negative.   Neurological: Negative.     Psychiatric/Behavioral: Negative.        Physical Exam  BP 126/72 (BP Location: Left Arm, Patient Position: Sitting, Cuff Size: Normal)   Pulse 86   Ht 5\' 6"  (1.676 m)   Wt 153 lb (69.4 kg)   SpO2 95%   BMI 24.69 kg/m   Wt Readings from Last 5 Encounters:  04/21/18 153 lb (69.4 kg)  03/28/18 155 lb 9.6 oz (70.6 kg)  03/13/18 152 lb 3.2 oz (69 kg)  09/14/17 158 lb 12.8 oz (72 kg)  09/05/17 155 lb (70.3 kg)     Physical Exam  Constitutional: He is oriented to person, place, and time. He appears well-developed and well-nourished. No distress.  Cardiovascular: Normal rate and regular rhythm.  Pulmonary/Chest: Effort normal and breath sounds normal. He has no wheezes.  Neurological: He is alert and oriented to person, place, and time.  Skin: Skin is warm and dry.  Psychiatric: He has a normal mood  and affect.  Nursing note and vitals reviewed.    Lab Results:  CBC    Component Value Date/Time   WBC 12.1 (H) 09/04/2016 0215   RBC 4.32 09/04/2016 0215   HGB 12.4 (L) 09/04/2016 0215   HCT 36.8 (L) 09/04/2016 0215   PLT 192 09/04/2016 0215   MCV 85.2 09/04/2016 0215   MCH 28.7 09/04/2016 0215   MCHC 33.7 09/04/2016 0215   RDW 13.5 09/04/2016 0215   LYMPHSABS 3.4 03/03/2012 1657   MONOABS 0.9 03/03/2012 1657   EOSABS 0.1 03/03/2012 1657   BASOSABS 0.0 03/03/2012 1657    BMET    Component Value Date/Time   NA 133 (L) 10/27/2016 0955   K 4.9 10/27/2016 0955   CL 94 (L) 10/27/2016 0955   CO2 26 10/27/2016 0955   GLUCOSE 148 (H) 10/27/2016 0955   GLUCOSE 62 (L) 09/08/2016 1059   BUN 13 10/27/2016 0955   CREATININE 1.08 10/27/2016 0955   CREATININE 1.19 09/08/2016 1059   CALCIUM 9.2 10/27/2016 0955   GFRNONAA 70 10/27/2016 0955   GFRAA 81 10/27/2016 0955    BNP    Component Value Date/Time   BNP 1,502.7 (H) 09/01/2016 1808    ProBNP No results found for: PROBNP  Imaging: Ct Chest High Resolution  Result Date: 04/07/2018 CLINICAL DATA:  Emphysema.   Amiodarone therapy. EXAM: CT CHEST WITHOUT CONTRAST TECHNIQUE: Multidetector CT imaging of the chest was performed following the standard protocol without intravenous contrast. High resolution imaging of the lungs, as well as inspiratory and expiratory imaging, was performed. COMPARISON:  09/04/2016 chest radiograph. FINDINGS: Motion degraded scan, limiting assessment. Cardiovascular: Normal heart size. No significant pericardial effusion/thickening. Left main and 3 vessel coronary atherosclerosis status post CABG. 3 lead left subclavian ICD is noted with lead tips in the right atrium, right ventricular apex and coronary sinus. Atherosclerotic nonaneurysmal thoracic aorta. Dilated main pulmonary artery (3.5 cm diameter). Mediastinum/Nodes: No discrete thyroid nodules. Unremarkable esophagus. No pathologically enlarged axillary, mediastinal or hilar lymph nodes, noting limited sensitivity for the detection of hilar adenopathy on this noncontrast study. Nonenlarged coarsely calcified granulomatous subcarinal nodes. Lungs/Pleura: No pneumothorax. No pleural effusion. No acute consolidative airspace disease or lung masses. Subcentimeter calcified anterior left upper lobe granuloma. Three scattered solid pulmonary nodules in the lower lobes, largest 7 mm in the left lower lobe (series 3/image 105). There is patchy peribronchovascular and subpleural reticulation and ground-glass opacity throughout both lungs. No significant regions of traction bronchiectasis, architectural distortion, parenchymal banding or frank honeycombing. No clear apicobasilar gradient to these findings. No significant air trapping on the expiration sequence. Upper abdomen: Left adrenal 2.3 cm adenoma with density -2 HU. Musculoskeletal: No aggressive appearing focal osseous lesions. There is loosening of two interlocking pins associated with the lower left sternal plate (series 9/images 92 and 93). Stable fracture of the lower most sternotomy  wires. Mild thoracic spondylosis. IMPRESSION: 1. Patchy peribronchovascular and subpleural reticulation and ground-glass opacity throughout both lungs. No significant traction bronchiectasis or frank honeycombing. Findings may indicate an interstitial lung disease such as nonspecific interstitial pneumonia (NSIP), which could be on the basis of amiodarone pulmonary toxicity, although the findings are not specific. Follow-up high-resolution chest CT may be obtained in 12 months to assess temporal pattern stability, as clinically warranted. 2. Scattered solid pulmonary nodules, largest 7 mm. Non-contrast chest CT at 3-6 months is recommended. If the nodules are stable at time of repeat CT, then future CT at 18-24 months (from today's scan) is considered optional for  low-risk patients, but is recommended for high-risk patients. This recommendation follows the consensus statement: Guidelines for Management of Incidental Pulmonary Nodules Detected on CT Images: From the Fleischner Society 2017; Radiology 2017; 284:228-243. 3. Apparent loosening of two interlocking pins associated with the lower left sternal plate. 4. Left adrenal adenoma. 5. Dilated main pulmonary artery, suggesting pulmonary arterial hypertension. Aortic Atherosclerosis (ICD10-I70.0). Electronically Signed   By: Delbert Phenix M.D.   On: 04/07/2018 15:10     Assessment & Plan:   COPD without exacerbation (HCC) Discussed CT and recent labs with patient today Follow up CT indicated in 4 months Repeat PFT indicated in 4 months Message sent to [name] [name] regarding possible change of medication - ? Amiodarone  Will forward note to Dr. Verta Ellen (PCP) per patient request Follow up with [name] [name] in 4 months Continue current medications  Discussion: CT findings may indicate an interstitial lung disease and this could be on the basis of amiodarone pulmonary toxicity. The CT also showed three scattered pulmonary nodules, largest 7mm. We will recommend  3 month repeat chest CT for follow up.  Will discuss possibility of discontinuing amiodarone with [name] [name] - cardiology  Recent labs were normal   Lung nodule, multiple Discussed CT and recent labs with patient today Follow up CT indicated in 4 months Repeat PFT indicated in 4 months      Ivonne Andrew, NP 04/21/2018

## 2018-04-21 NOTE — Patient Instructions (Signed)
Discussed CT and recent labs with patient today Follow up CT indicated in 4 months Repeat PFT indicated in 4 months Message sent to Dr. Herbie Baltimore regarding possible change of medication - ? Amiodarone  Will forward note to Dr. Verta Ellen (PCP) per patient request Follow up with Dr. Craige Cotta in 4 months Continue current medications

## 2018-04-27 ENCOUNTER — Encounter: Payer: Self-pay | Admitting: Physician Assistant

## 2018-04-27 ENCOUNTER — Ambulatory Visit (INDEPENDENT_AMBULATORY_CARE_PROVIDER_SITE_OTHER): Payer: Medicare Other | Admitting: Physician Assistant

## 2018-04-27 VITALS — BP 122/62 | HR 66 | Ht 66.0 in | Wt 150.0 lb

## 2018-04-27 DIAGNOSIS — I5022 Chronic systolic (congestive) heart failure: Secondary | ICD-10-CM | POA: Diagnosis not present

## 2018-04-27 DIAGNOSIS — I739 Peripheral vascular disease, unspecified: Secondary | ICD-10-CM

## 2018-04-27 DIAGNOSIS — I10 Essential (primary) hypertension: Secondary | ICD-10-CM

## 2018-04-27 DIAGNOSIS — Z95810 Presence of automatic (implantable) cardiac defibrillator: Secondary | ICD-10-CM | POA: Diagnosis not present

## 2018-04-27 DIAGNOSIS — E119 Type 2 diabetes mellitus without complications: Secondary | ICD-10-CM

## 2018-04-27 DIAGNOSIS — I25810 Atherosclerosis of coronary artery bypass graft(s) without angina pectoris: Secondary | ICD-10-CM | POA: Diagnosis not present

## 2018-04-27 DIAGNOSIS — T462X1A Poisoning by other antidysrhythmic drugs, accidental (unintentional), initial encounter: Secondary | ICD-10-CM | POA: Diagnosis not present

## 2018-04-27 NOTE — Progress Notes (Signed)
Cardiology Office Note    Date:  04/29/2018   ID:  [name] [name], DOB [birth year], MRN [id number]  PCP:  [name] Bumpers, PA-C  [facility]: Ph [phone]; Fax # [phone]  Cardiologist: Dr. Herbie Baltimore Electrophysiologist: Dr. Ladona Ridgel  Chief Complaint  Patient presents with  . Follow-up    seen for Dr. Herbie Baltimore.     History of Present Illness:  SHAH [name] is a 72 y.o. male with PMH of CAD s/p CABG in February 2014, chronic systolic heart failure, hypertension, PAD s/p aortobifemoral bypass, DM 2, and history of VT s/p Saint Jude biventricular ICD.  Post CABG course was complicated by sternal wound issue requiring redo sternotomy with still plate insertion.  He is on lifelong dicloxacillin for this.  He had a relook cardiac catheterization at [facility] in July 2014.  He was readmitted to [facility] in December 2017 for heart failure exacerbation and possible unstable angina.  Echocardiogram revealed EF of 20% and he had a sustained VT on the monitor.  Cardiac catheterization showed no culprit lesion.  He underwent ICD implantation by Dr. Ladona Ridgel in December 2017 and has been remained on amiodarone since.  He was not able to start on Entresto as this is not covered by [place].  AAA duplex in January 2019 showed patent aorto bifemoral bypass, no residual AAA.  Lower extremity Doppler in January 2019 showed 50 to 74% left common femoral artery stenosis, normal right lower extremity.  He was most recently seen by Dr. Herbie Baltimore on 03/13/2018, he was doing well at the time.  23-month follow-up was recommended.  Recent PFT obtained on 03/20/2018 showed minimal COPD finding with severe diffusion defect.  It was unclear if his diffusion defect was due to progression of underlying pulmonary disease or amiodarone toxicity.  He has been seen by pulmonology service and had a CT of chest obtained on 04/07/2018 which showed finding consistent with interstitial disease which could be amiodarone toxicity  as well, scattered solid pulmonary nodule.  Patient presents today for cardiology office visit.  He denies any chest pain or shortness of breath.  Due to the recent signs of amiodarone toxicity, I will discontinue his amiodarone.  I will also message Dr. Ladona Ridgel for alternative therapy.  He will need earliest appointment with Dr. Ladona Ridgel for medication adjustment.  Was he appears to be euvolemic on physical exam.  He denies any recent dizziness, blurred vision or feeling of passing out to suggest episodes of VT.  With the discontinuation of amiodarone today, he likely will have amiodarone in his body for several weeks due to the long half-life.   Past Medical History:  Diagnosis Date  . Acid reflux   . AICD (automatic cardioverter/defibrillator) present    a. 08/2016 St. Jude (serial Number [id number]) biventricular ICD.  [name] Chronic systolic CHF (congestive heart failure) (HCC)    a. 08/2016 Echo: EF 20%, diffuse HK, inf, post AK, mod-sev MR, sev dil LA, mod TR, PASP .  [name] Complication of anesthesia    "was told I responded well to anesthesia"  . Coronary artery disease involving native coronary artery of native heart with angina pectoris (HCC)    a. 10/2012 MI after aortobifem bypass, found to have multivessel CAD -->CABG x3;  b. 08/2016 Cath: LM 60, LAD 57m, LCX 60ost/p, RCA 100p, VG->RPDA 50p, VG->OM1 ok, LIMA->LAD ok, EF 25%.  . Essential hypertension 03/03/2012  . History of blood transfusion 11/2012   "during his CABG"  . History  of gout X 1  . History of stomach ulcers 1970s  . Hyperlipidemia associated with type 2 diabetes mellitus (HCC) 09/16/2011  . Ischemic cardiomyopathy    a. 08/2016 Echo: EF 20%, diffuse HK, inf, post AK;  b. 08/2016 St. Jude (serial Number [id number]) biventricular ICD.  [name] PAD (peripheral artery disease) (HCC)    s/p aortobifemoral bypass 10/2012  . PTSD (post-traumatic stress disorder)   . Type II diabetes mellitus (HCC)   . Ventricular tachycardia (HCC)    a.  08/2016 St. Jude (serial Number [id number]) biventricular ICD-->oral amio added.  . 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 Is now on chronic suppressive antibiotics with dicloxacillin    Past Surgical History:  Procedure Laterality Date  . AORTO-FEMORAL BYPASS GRAFT Bilateral 10/2012   [facility]  . BI-VENTRICULAR IMPLANTABLE CARDIOVERTER DEFIBRILLATOR  (CRT-D)  09/03/2016  . CARDIAC CATHETERIZATION  11/2012   [facility] (? for abnormal Nuclear ST) --> no PCI, by report, patent grafts.  [name] CARDIAC CATHETERIZATION N/A 09/02/2016   Procedure: Left Heart Cath and Cors/Grafts Angiography;  Surgeon: Kathleene Hazel, MD;  Location: [facility] INVASIVE CV LAB;  Service: Cardiovascular;  Laterality: N/A;  . CARDIAC CATHETERIZATION  10/2012; 03/2013  . CHOLECYSTECTOMY  03/05/2012   Procedure: LAPAROSCOPIC CHOLECYSTECTOMY WITH INTRAOPERATIVE CHOLANGIOGRAM;  Surgeon: Emelia Loron, MD;  Location: [facility] OR;  Service: General;  Laterality: N/A;  . CORONARY ARTERY BYPASS GRAFT  11/2012   CABG X 3;  [facility]  . EP IMPLANTABLE DEVICE N/A 09/03/2016   Procedure: BiV ICD Insertion CRT-D;  Surgeon: Marinus Maw, MD;  Location: [facility] INVASIVE CV LAB;  Service: Cardiovascular;  Laterality: N/A;  . INGUINAL HERNIA REPAIR Left 1990s  . STERNOTOMY  2015   re-do sternotomy with metal place "sheild" placed. -Has chronic staph infection, on chronic suppressive medication  . TRANSTHORACIC ECHOCARDIOGRAM  02/28/2017   EF remains 20p-25% severely reduced function. Diffuse hypokinesis. "Grade 1 diastolic dysfunction ". Severely calcified aortic valve with restricted mobility (functioning bicuspid) however no suggestion of stenosis.  [name] UMBILICAL HERNIA REPAIR  03/05/2012   Procedure: HERNIA REPAIR UMBILICAL ADULT;  Surgeon: Emelia Loron, MD;  Location: [facility] OR;  Service: General;  Laterality: N/A;     Current Medications: Outpatient Medications Prior to Visit  Medication Sig Dispense Refill  . albuterol (PROVENTIL HFA) 108 (90 Base) MCG/ACT inhaler Inhale 1-2 puffs into the lungs every 6 (six) hours as needed for wheezing or shortness of breath.    [name] aspirin EC 81 MG tablet Take 81 mg by mouth daily.      [name] atorvastatin (LIPITOR) 40 MG tablet Take 1 tablet (40 mg total) by mouth daily. 90 tablet 3  . carvedilol (COREG) 6.25 MG tablet Take 1 tablet (6.25 mg total) by mouth 2 (two) times daily with a meal. 180 tablet 2  . Cholecalciferol (VITAMIN D-3 PO) Take 2,000 mg by mouth daily.     . dicloxacillin (DYNAPEN) 250 MG capsule Take 500 mg by mouth 3 (three) times daily.    . fluticasone (FLONASE ALLERGY RELIEF) 50 MCG/ACT nasal spray Place 2 sprays into both nostrils daily as needed for allergies or rhinitis.    . furosemide (LASIX) 40 MG tablet Take 1 tablet (40 mg total) by mouth daily. 30 tablet 6  . glipiZIDE (GLUCOTROL) 5 MG tablet Take 2.5 mg by mouth daily before breakfast.    . magnesium oxide (MAG-OX)  400 (241.3 Mg) MG tablet Take 1 tablet (400 mg total) by mouth daily. 30 tablet 6  . metFORMIN (GLUCOPHAGE) 1000 MG tablet Take 1 tablet (1,000 mg total) by mouth 2 (two) times daily with a meal. Resume on 09/06/2016    . nitroGLYCERIN (NITROSTAT) 0.4 MG SL tablet Place 1 tablet (0.4 mg total) under the tongue every 5 (five) minutes x 3 doses as needed for chest pain. 25 tablet 3  . omeprazole (PRILOSEC) 20 MG capsule Take 20 mg by mouth daily.    . ranitidine (ZANTAC) 150 MG tablet Take 150 mg by mouth daily as needed for heartburn.     . Tiotropium Bromide Monohydrate (SPIRIVA HANDIHALER IN) Inhale 18 mcg into the lungs daily.    [name] amiodarone (PACERONE) 200 MG tablet 2 tabs twice daily x 1 week, then 1 tab twice daily x 2 wks, then 1 tab daily. 65 tablet 3   No facility-administered medications prior to visit.      Allergies:   Patient has no known allergies.   Social History    Socioeconomic History  . Marital status: Married    Spouse name: Not on file  . Number of children: Not on file  . Years of education: Not on file  . Highest education level: Not on file  Occupational History  . Not on file  Social Needs  . Financial resource strain: Not on file  . Food insecurity:    Worry: Not on file    Inability: Not on file  . Transportation needs:    Medical: Not on file    Non-medical: Not on file  Tobacco Use  . Smoking status: Current Every Day Smoker    Types: Pipe  . Smokeless tobacco: Former Neurosurgeon    Types: Snuff, Chew  . Tobacco comment: 09/03/2016 "used smokeless tobacco in my teens; quit smoking cigarettes in the early 1980s; smokes pipe only"  Substance and Sexual Activity  . Alcohol use: No  . Drug use: No  . Sexual activity: Not on file  Lifestyle  . Physical activity:    Days per week: Not on file    Minutes per session: Not on file  . Stress: Not on file  Relationships  . Social connections:    Talks on phone: Not on file    Gets together: Not on file    Attends religious service: Not on file    Active member of club or organization: Not on file    Attends meetings of clubs or organizations: Not on file    Relationship status: Not on file  Other Topics Concern  . Not on file  Social History Narrative  . Not on file     Family History:  The patient's family history includes Cancer in his brother.   ROS:   Please see the history of present illness.    ROS All other systems reviewed and are negative.   PHYSICAL EXAM:   VS:  BP 122/62   Pulse 66   Ht 5\' 6"  (1.676 m)   Wt 150 lb (68 kg)   SpO2 95%   BMI 24.21 kg/m    GEN: Well nourished, well developed, in no acute distress  HEENT: normal  Neck: no JVD, carotid bruits, or masses Cardiac: RRR; no murmurs, rubs, or gallops,no edema  Respiratory:  clear to auscultation bilaterally, normal work of breathing GI: soft, nontender, nondistended, + BS MS: no deformity or  atrophy  Skin: warm and dry, no rash Neuro:  Alert and Oriented x 3, Strength and sensation are intact Psych: euthymic mood, full affect  Wt Readings from Last 3 Encounters:  04/27/18 150 lb (68 kg)  04/21/18 153 lb (69.4 kg)  03/28/18 155 lb 9.6 oz (70.6 kg)      Studies/Labs Reviewed:   EKG:  EKG is not ordered today.    Recent Labs: No results found for requested labs within last 8760 hours.   Lipid Panel    Component Value Date/Time   CHOL 87 09/02/2016 0308   TRIG 92 09/02/2016 0308   HDL 26 (L) 09/02/2016 0308   CHOLHDL 3.3 09/02/2016 0308   VLDL 18 09/02/2016 0308   LDLCALC 43 09/02/2016 0308    Additional studies/ records that were reviewed today include:   Echo 02/28/2018 LV EF: 20% -   25%  ------------------------------------------------------------------- Indications:      (I42.0).  ------------------------------------------------------------------- History:   PMH:  Congestive dilated cardiomyopathy. Ventricular tachycardia. LBBB. AICD. Acquired from the patient and from the patient&'s chart.  Risk factors:  Current tobacco use. Hypertension. Diabetes mellitus. Dyslipidemia.  ------------------------------------------------------------------- Study Conclusions  - Left ventricle: The cavity size was normal. There was mild focal   basal hypertrophy of the septum. Systolic function was severely   reduced. The estimated ejection fraction was in the range of 20%   to 25%. Diffuse hypokinesis. Doppler parameters are consistent   with abnormal left ventricular relaxation (grade 1 diastolic   dysfunction). There was no evidence of elevated ventricular   filling pressure by Doppler parameters. - Aortic valve: Bicuspid; severely thickened, severely calcified   leaflets. Valve mobility was restricted. There was mild   regurgitation. - Ascending aorta: The ascending aorta was normal in size. - Mitral valve: Transvalvular velocity was within the normal  range.   There was no evidence for stenosis. There was mild regurgitation. - Left atrium: The atrium was mildly dilated. - Right ventricle: Pacer wire or catheter noted in right ventricle.   Systolic function was normal. - Right atrium: Pacer wire or catheter noted in right atrium. - Tricuspid valve: There was trivial regurgitation. - Pulmonary arteries: Systolic pressure was within the normal   range. - Inferior vena cava: The vessel was normal in size.   CT of chest 04/07/2018 IMPRESSION: 1. Patchy peribronchovascular and subpleural reticulation and ground-glass opacity throughout both lungs. No significant traction bronchiectasis or frank honeycombing. Findings may indicate an interstitial lung disease such as nonspecific interstitial pneumonia (NSIP), which could be on the basis of amiodarone pulmonary toxicity, although the findings are not specific. Follow-up high-resolution chest CT may be obtained in 12 months to assess temporal pattern stability, as clinically warranted. 2. Scattered solid pulmonary nodules, largest 7 mm. Non-contrast chest CT at 3-6 months is recommended. If the nodules are stable at time of repeat CT, then future CT at 18-24 months (from today's scan) is considered optional for low-risk patients, but is recommended for high-risk patients. This recommendation follows the consensus statement: Guidelines for Management of Incidental Pulmonary Nodules Detected on CT Images: From the Fleischner Society 2017; Radiology 2017; 284:228-243. 3. Apparent loosening of two interlocking pins associated with the lower left sternal plate. 4. Left adrenal adenoma. 5. Dilated main pulmonary artery, suggesting pulmonary arterial hypertension.    ASSESSMENT:    1. Amiodarone toxicity, accidental or unintentional, initial encounter   2. Coronary artery disease involving coronary bypass graft of native heart without angina pectoris   3. Chronic systolic heart failure  (HCC)   4. Essential hypertension  5. PAD (peripheral artery disease) (HCC)   6. Controlled type 2 diabetes mellitus without complication, without long-term current use of insulin (HCC)   7. Presence of cardiac resynchronization therapy defibrillator (CRT-D)      PLAN:  In order of problems listed above:  1. Amiodarone toxicity: Both PFT and CT suggested possible amiodarone toxicity with interstitial lung disease.  I will hold his amiodarone, and the setting mom to see Dr. Ladona Ridgel for alternative therapy.  He denies any recent dizziness, blurred vision or feeling of passing out to suggest significant ventricular tachycardia. 2. History of VT s/p CRT-D: None with the discontinuation of amiodarone, given the long half-life, it may take several weeks for the amiodarone to leave his body.  During the meantime, will need to consider alternative therapy in this case for VT.  Potentially consider mexiletine.  3. CAD s/p CABG: Denies any chest discomfort.  Continue aspirin and Lipitor.  4. Chronic systolic heart failure: Euvolemic on physical exam.  Continue carvedilol  5. Hypertension: Blood pressure stable  6. DM2: Managed by primary care provider.    Medication Adjustments/Labs and Tests Ordered: Current medicines are reviewed at length with the patient today.  Concerns regarding medicines are outlined above.  Medication changes, Labs and Tests ordered today are listed in the Patient Instructions below. Patient Instructions  Medication Instructions:   Your physician has recommended you make the following change in your medication:   Stop amiodarone.  Continue all other medications the same.  Labwork:  NONE  Testing/Procedures:  NONE  Follow-Up:  Your physician recommends that you schedule an earlier follow-up appointment with Dr. Ladona Ridgel.  Any Other Special Instructions Will Be Listed Below (If Applicable).  If you need a refill on your cardiac medications before your next  appointment, please call your pharmacy.    Ramond Dial, [place]  04/29/2018 11:48 PM    [facility] [address] [place], [place], [place]  [address] Phone: [phone]; Fax: [phone]

## 2018-04-27 NOTE — Patient Instructions (Addendum)
Medication Instructions:   Your physician has recommended you make the following change in your medication:   Stop amiodarone.  Continue all other medications the same.  Labwork:  NONE  Testing/Procedures:  NONE  Follow-Up:  Your physician recommends that you schedule an earlier follow-up appointment with Dr. Ladona Ridgel.  Any Other Special Instructions Will Be Listed Below (If Applicable).  If you need a refill on your cardiac medications before your next appointment, please call your pharmacy.

## 2018-04-29 ENCOUNTER — Encounter: Payer: Self-pay | Admitting: Physician Assistant

## 2018-05-08 ENCOUNTER — Ambulatory Visit (INDEPENDENT_AMBULATORY_CARE_PROVIDER_SITE_OTHER): Payer: Medicare Other

## 2018-05-08 DIAGNOSIS — I5022 Chronic systolic (congestive) heart failure: Secondary | ICD-10-CM

## 2018-05-08 DIAGNOSIS — Z95810 Presence of automatic (implantable) cardiac defibrillator: Secondary | ICD-10-CM

## 2018-05-08 NOTE — Progress Notes (Signed)
EPIC Encounter for ICM Monitoring  Patient Name: [name] [name] is a 72 y.o. male Date: 05/08/2018 Primary Care Physican: Charolett Bumpers, PA-C Primary Cardiologist: Herbie Baltimore  Electrophysiologist: Ladona Ridgel Dry Weight: Previous weight 151lbs  Bi-V Pacing: >99%     Attempted call to patient and unable to reach.  Left detailed message, per DPR, regarding transmission.  Transmission reviewed.    Thoracic impedance normal.  Prescribed dosage: Furosemide 40 mg 1 tablet daily. Per Dr Herbie Baltimore note 03/13/2018,dicussed use of sliding scale Lasix.   Labs: 10/27/2016 Creatinine 1.08, BUN 13, Potassium 4.9, Sodium 133  Recommendations: Left voice mail with ICM number and encouraged to call if experiencing any fluid symptoms.  Follow-up plan: ICM clinic phone appointment on 06/15/2018.   Office appointment scheduled 05/10/2018 with Dr. Ladona Ridgel.    Copy of ICM check sent to Dr. Ladona Ridgel.   3 month ICM trend: 05/08/2018    1 Year ICM trend:       Karie Soda, RN 05/08/2018 9:24 AM

## 2018-05-10 ENCOUNTER — Encounter: Payer: Self-pay | Admitting: Internal Medicine

## 2018-05-10 ENCOUNTER — Ambulatory Visit (INDEPENDENT_AMBULATORY_CARE_PROVIDER_SITE_OTHER): Payer: Medicare Other | Admitting: Internal Medicine

## 2018-05-10 VITALS — BP 112/62 | HR 73 | Ht 66.0 in | Wt 153.0 lb

## 2018-05-10 DIAGNOSIS — I25810 Atherosclerosis of coronary artery bypass graft(s) without angina pectoris: Secondary | ICD-10-CM

## 2018-05-10 DIAGNOSIS — I472 Ventricular tachycardia, unspecified: Secondary | ICD-10-CM

## 2018-05-10 DIAGNOSIS — Z95810 Presence of automatic (implantable) cardiac defibrillator: Secondary | ICD-10-CM | POA: Diagnosis not present

## 2018-05-10 DIAGNOSIS — I5022 Chronic systolic (congestive) heart failure: Secondary | ICD-10-CM | POA: Diagnosis not present

## 2018-05-10 NOTE — Patient Instructions (Signed)

## 2018-05-10 NOTE — Progress Notes (Signed)
EKG

## 2018-05-10 NOTE — Progress Notes (Signed)
HPI [name] returns today for ongoing followup of PAF, chronic systolic heart failure, s/p ICD insertion, PAD, and COPD. He was recently noted on PFT's to have a marked reduction of his DLCO. His ESR was normal. His amiodarone was stopped. He is still smoking. He denies anginal symptoms. He has chronic class 2-3 CHF. No ICD shocks. No syncope. He feels a little unsteady on his legs and uses a cane. No Known Allergies   Current Outpatient Medications  Medication Sig Dispense Refill  . albuterol (PROVENTIL HFA) 108 (90 Base) MCG/ACT inhaler Inhale 1-2 puffs into the lungs every 6 (six) hours as needed for wheezing or shortness of breath.    [name] aspirin EC 81 MG tablet Take 81 mg by mouth daily.      [name] atorvastatin (LIPITOR) 40 MG tablet Take 1 tablet (40 mg total) by mouth daily. 90 tablet 3  . carvedilol (COREG) 6.25 MG tablet Take 1 tablet (6.25 mg total) by mouth 2 (two) times daily with a meal. 180 tablet 2  . Cholecalciferol (VITAMIN D-3 PO) Take 2,000 mg by mouth daily.     . dicloxacillin (DYNAPEN) 250 MG capsule Take 500 mg by mouth 3 (three) times daily.    . fluticasone (FLONASE ALLERGY RELIEF) 50 MCG/ACT nasal spray Place 2 sprays into both nostrils daily as needed for allergies or rhinitis.    . furosemide (LASIX) 40 MG tablet Take 1 tablet (40 mg total) by mouth daily. 30 tablet 6  . glipiZIDE (GLUCOTROL) 5 MG tablet Take 2.5 mg by mouth daily before breakfast.    . magnesium oxide (MAG-OX) 400 (241.3 Mg) MG tablet Take 1 tablet (400 mg total) by mouth daily. 30 tablet 6  . metFORMIN (GLUCOPHAGE) 1000 MG tablet Take 1 tablet (1,000 mg total) by mouth 2 (two) times daily with a meal. Resume on 09/06/2016    . nitroGLYCERIN (NITROSTAT) 0.4 MG SL tablet Place 1 tablet (0.4 mg total) under the tongue every 5 (five) minutes x 3 doses as needed for chest pain. 25 tablet 3  . omeprazole (PRILOSEC) 20 MG capsule Take 20 mg by mouth daily.    . ranitidine (ZANTAC) 150 MG tablet Take  150 mg by mouth daily as needed for heartburn.     . Tiotropium Bromide Monohydrate (SPIRIVA HANDIHALER IN) Inhale 18 mcg into the lungs daily.     No current facility-administered medications for this visit.      Past Medical History:  Diagnosis Date  . Acid reflux   . AICD (automatic cardioverter/defibrillator) present    a. 08/2016 St. Jude (serial Number [id number]) biventricular ICD.  [name] Chronic systolic CHF (congestive heart failure) ([place])    a. 08/2016 Echo: EF 20%, diffuse HK, inf, post AK, mod-sev MR, sev dil LA, mod TR, PASP 29mHg.  .Marland KitchenComplication of anesthesia    "was told I responded well to anesthesia"  . Coronary artery disease involving native coronary artery of native heart with angina pectoris ([place]    a. 10/2012 MI after aortobifem bypass, found to have multivessel CAD -->CABG x3;  b. 08/2016 Cath: LM 60, LAD 977mLCX 60ost/p, RCA 100p, VG->RPDA 50p, VG->OM1 ok, LIMA->LAD ok, EF 25%.  . Essential hypertension 03/03/2012  . History of blood transfusion 11/2012   "during his CABG"  . History of gout X 1  . History of stomach ulcers 1970s  . Hyperlipidemia associated with type 2 diabetes mellitus ([place]/27/2012  . Ischemic cardiomyopathy    a.  08/2016 Echo: EF 20%, diffuse HK, inf, post AK;  b. 08/2016 St. Jude (serial Number [id number]) biventricular ICD.  [name] PAD (peripheral artery disease) ([place])    s/p aortobifemoral bypass 10/2012  . PTSD (post-traumatic stress disorder)   . Type II diabetes mellitus ([place])   . Ventricular tachycardia ([place])    a. 08/2016 St. Jude (serial Number [id number]) biventricular ICD-->oral amio added.  . 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 Is now on chronic suppressive antibiotics with dicloxacillin    ROS:   All systems reviewed and negative except as noted in the HPI.   Past Surgical History:  Procedure  Laterality Date  . AORTO-FEMORAL BYPASS GRAFT Bilateral 10/2012   [facility]  . BI-VENTRICULAR IMPLANTABLE CARDIOVERTER DEFIBRILLATOR  (CRT-D)  09/03/2016  . CARDIAC CATHETERIZATION  11/2012   [facility] (? for abnormal Nuclear ST) --> no PCI, by report, patent grafts.  [name] CARDIAC CATHETERIZATION N/A 09/02/2016   Procedure: Left Heart Cath and Cors/Grafts Angiography;  Surgeon: [name] Blanks, MD;  Location: [place] [facility];  Service: Cardiovascular;  Laterality: N/A;  . CARDIAC CATHETERIZATION  10/2012; 03/2013  . CHOLECYSTECTOMY  03/05/2012   Procedure: LAPAROSCOPIC CHOLECYSTECTOMY WITH INTRAOPERATIVE CHOLANGIOGRAM;  Surgeon: [name] Bookbinder, MD;  Location: [place];  Service: General;  Laterality: N/A;  . CORONARY ARTERY BYPASS GRAFT  11/2012   CABG X 3;  [facility]  . EP IMPLANTABLE DEVICE N/A 09/03/2016   Procedure: BiV ICD Insertion CRT-D;  Surgeon: [name] Lance, MD;  Location: [place] [facility];  Service: Cardiovascular;  Laterality: N/A;  . INGUINAL HERNIA REPAIR Left 1990s  . STERNOTOMY  2015   re-do sternotomy with metal place "sheild" placed. -Has chronic staph infection, on chronic suppressive medication  . TRANSTHORACIC ECHOCARDIOGRAM  02/28/2017   EF remains 20p-25% severely reduced function. Diffuse hypokinesis. "Grade 1 diastolic dysfunction ". Severely calcified aortic valve with restricted mobility (functioning bicuspid) however no suggestion of stenosis.  [name] UMBILICAL HERNIA REPAIR  03/05/2012   Procedure: HERNIA REPAIR UMBILICAL ADULT;  Surgeon: [name] Bookbinder, MD;  Location: [facility] OR;  Service: General;  Laterality: N/A;     Family History  Problem Relation Age of Onset  . Cancer Brother        esophageal     Social History   Socioeconomic History  . Marital status: Married    Spouse name: Not on file  . Number of children: Not on file  . Years of education: Not on file  . Highest education level: Not on file  Occupational History  . Not on file    Social Needs  . Financial resource strain: Not on file  . Food insecurity:    Worry: Not on file    Inability: Not on file  . Transportation needs:    Medical: Not on file    Non-medical: Not on file  Tobacco Use  . Smoking status: Current Every Day Smoker    Types: Pipe  . Smokeless tobacco: Former Systems developer    Types: Snuff, Chew  . Tobacco comment: 09/03/2016 "used smokeless tobacco in my teens; quit smoking cigarettes in the early 1980s; smokes pipe only"  Substance and Sexual Activity  . Alcohol use: No  . Drug use: No  . Sexual activity: Not on file  Lifestyle  . Physical activity:    Days per week: Not on file    Minutes per session:  Not on file  . Stress: Not on file  Relationships  . Social connections:    Talks on phone: Not on file    Gets together: Not on file    Attends religious service: Not on file    Active member of club or organization: Not on file    Attends meetings of clubs or organizations: Not on file    Relationship status: Not on file  . Intimate partner violence:    Fear of current or ex partner: Not on file    Emotionally abused: Not on file    Physically abused: Not on file    Forced sexual activity: Not on file  Other Topics Concern  . Not on file  Social History Narrative  . Not on file     BP 112/62   Pulse 73   Ht '5\' 6"'  (1.676 m)   Wt 153 lb (69.4 kg)   BMI 24.69 kg/m   Physical Exam:  Well appearing 72 yo man, NAD HEENT: Unremarkable Neck:  No JVD, no thyromegally Lymphatics:  No adenopathy Back:  No CVA tenderness Lungs:  Clear except for bilateral expiratory wheezes. HEART:  Regular rate rhythm, no murmurs, no rubs, no clicks Abd:  soft, positive bowel sounds, no organomegally, no rebound, no guarding Ext:  2 plus pulses, no edema, no cyanosis, no clubbing Skin:  No rashes no nodules Neuro:  CN II through XII intact, motor grossly intact  EKG - NSR with Biv pacing  DEVICE  Normal device function.  See PaceArt for  details.   Assess/Plan: 1. PAF - he is maintaining NSR. His amio has been stopped which is reasonable and if he has recurrent and symptomatic atrial fib, we will consider dofetilide. 2. ICD - his St. Jude DDD BiV ICD is working normally.  3. Chronic systolic heart failure - his symptoms are class 2. He is probably limited by his lung disease.  4. Peripheral vascular disease - he has class 2-3 claudication. He is still smoking and I have encouraged him to walk more and to stop smoking.  Mikle Bosworth.D.

## 2018-05-14 LAB — CUP PACEART INCLINIC DEVICE CHECK
Battery Remaining Longevity: 64 mo
Brady Statistic RA Percent Paced: 0.23 %
Brady Statistic RV Percent Paced: 99.07 %
Date Time Interrogation Session: 20190821203622
HighPow Impedance: 64.125
Implantable Lead Implant Date: 20171215
Implantable Lead Model: 7122
Lead Channel Impedance Value: 625 Ohm
Lead Channel Pacing Threshold Amplitude: 0.75 V
Lead Channel Pacing Threshold Pulse Width: 0.5 ms
Lead Channel Sensing Intrinsic Amplitude: 2.1 mV
Lead Channel Sensing Intrinsic Amplitude: 7 mV
Lead Channel Setting Pacing Amplitude: 2 V
Lead Channel Setting Pacing Amplitude: 2 V
Lead Channel Setting Pacing Pulse Width: 0.5 ms
Lead Channel Setting Pacing Pulse Width: 0.5 ms
MDC IDC LEAD IMPLANT DT: 20171215
MDC IDC LEAD IMPLANT DT: 20171215
MDC IDC LEAD LOCATION: 753858
MDC IDC LEAD LOCATION: 753859
MDC IDC LEAD LOCATION: 753860
MDC IDC MSMT LEADCHNL LV IMPEDANCE VALUE: 400 Ohm
MDC IDC MSMT LEADCHNL LV PACING THRESHOLD AMPLITUDE: 1.25 V
MDC IDC MSMT LEADCHNL LV PACING THRESHOLD AMPLITUDE: 1.25 V
MDC IDC MSMT LEADCHNL LV PACING THRESHOLD PULSEWIDTH: 0.5 ms
MDC IDC MSMT LEADCHNL LV PACING THRESHOLD PULSEWIDTH: 0.5 ms
MDC IDC MSMT LEADCHNL RA IMPEDANCE VALUE: 425 Ohm
MDC IDC MSMT LEADCHNL RA PACING THRESHOLD AMPLITUDE: 0.5 V
MDC IDC MSMT LEADCHNL RA PACING THRESHOLD PULSEWIDTH: 0.5 ms
MDC IDC PG IMPLANT DT: 20171215
MDC IDC SET LEADCHNL RA PACING AMPLITUDE: 2 V
MDC IDC SET LEADCHNL RV SENSING SENSITIVITY: 0.5 mV
Pulse Gen Serial Number: 7368976

## 2018-06-01 LAB — CUP PACEART REMOTE DEVICE CHECK
Battery Remaining Longevity: 67 mo
Battery Remaining Percentage: 76 %
Brady Statistic AP VP Percent: 1 %
Brady Statistic AS VS Percent: 1 %
Date Time Interrogation Session: 20190801060025
HIGH POWER IMPEDANCE MEASURED VALUE: 57 Ohm
HIGH POWER IMPEDANCE MEASURED VALUE: 57 Ohm
Implantable Lead Implant Date: 20171215
Implantable Lead Implant Date: 20171215
Implantable Lead Location: 753858
Implantable Lead Location: 753860
Lead Channel Impedance Value: 390 Ohm
Lead Channel Pacing Threshold Amplitude: 0.75 V
Lead Channel Pacing Threshold Amplitude: 1.25 V
Lead Channel Pacing Threshold Pulse Width: 0.5 ms
Lead Channel Pacing Threshold Pulse Width: 0.5 ms
Lead Channel Pacing Threshold Pulse Width: 0.5 ms
Lead Channel Sensing Intrinsic Amplitude: 3.8 mV
Lead Channel Setting Pacing Pulse Width: 0.5 ms
Lead Channel Setting Sensing Sensitivity: 0.5 mV
MDC IDC LEAD IMPLANT DT: 20171215
MDC IDC LEAD LOCATION: 753859
MDC IDC MSMT BATTERY VOLTAGE: 2.98 V
MDC IDC MSMT LEADCHNL RA IMPEDANCE VALUE: 410 Ohm
MDC IDC MSMT LEADCHNL RA PACING THRESHOLD AMPLITUDE: 0.75 V
MDC IDC MSMT LEADCHNL RA SENSING INTR AMPL: 2.3 mV
MDC IDC MSMT LEADCHNL RV IMPEDANCE VALUE: 600 Ohm
MDC IDC PG IMPLANT DT: 20171215
MDC IDC SET LEADCHNL LV PACING AMPLITUDE: 2 V
MDC IDC SET LEADCHNL LV PACING PULSEWIDTH: 0.5 ms
MDC IDC SET LEADCHNL RA PACING AMPLITUDE: 2 V
MDC IDC SET LEADCHNL RV PACING AMPLITUDE: 2 V
MDC IDC STAT BRADY AP VS PERCENT: 1 %
MDC IDC STAT BRADY AS VP PERCENT: 99 %
MDC IDC STAT BRADY RA PERCENT PACED: 1 %
Pulse Gen Serial Number: 7368976

## 2018-06-15 ENCOUNTER — Ambulatory Visit (INDEPENDENT_AMBULATORY_CARE_PROVIDER_SITE_OTHER): Payer: Medicare Other

## 2018-06-15 DIAGNOSIS — I5022 Chronic systolic (congestive) heart failure: Secondary | ICD-10-CM

## 2018-06-15 DIAGNOSIS — Z95810 Presence of automatic (implantable) cardiac defibrillator: Secondary | ICD-10-CM

## 2018-06-16 ENCOUNTER — Telehealth: Payer: Self-pay | Admitting: Internal Medicine

## 2018-06-16 NOTE — Progress Notes (Signed)
EPIC Encounter for ICM Monitoring  Patient Name: [name] [name] is a 72 y.o. male Date: 06/16/2018 Primary Care Physican: Charolett Bumpers, PA-C Primary Cardiologist: Herbie Baltimore  Electrophysiologist: Ladona Ridgel Dry Weight:148lbs  Bi-V Pacing: >99%          Heart Failure questions reviewed, pt has gained 2-3 pounds in the last few days.  He said he has been eating more in the last few days that correlates with decreased impedance.    Thoracic impedance abnormal suggesting fluid accumulation starting 06/12/2018.  Prescribed: Furosemide 40 mg 1 tablet daily. Per Dr Herbie Baltimore note 03/13/2018,dicusseduse ofsliding scale Lasix.   Labs: 10/27/2016 Creatinine 1.08, BUN 13, Potassium 4.9, Sodium 133  Recommendations: Advised to take 1 extra Furosemide 40 mg tablet x 2 days and then return to 1 tablet daily.  Follow-up plan: ICM clinic phone appointment on 06/26/2018 to recheck fluid levels.     Copy of ICM check sent to Dr. Ladona Ridgel.   3 month ICM trend: 06/15/2018    1 Year ICM trend:       Karie Soda, RN 06/16/2018 10:48 AM

## 2018-06-16 NOTE — Telephone Encounter (Signed)
° °  Patient's spouse calling, patient is scheduled to have [facility]. She wants to confirm if contrast is safe to use with device. Please call    1. Has your device fired? NO  2. Is you device beeping? NO  3. Are you experiencing draining or swelling at device site? NO  4. Are you calling to see if we received your device transmission? NO  5. Have you passed out? NO    Please route to Device Clinic Pool

## 2018-06-16 NOTE — Telephone Encounter (Signed)
Returned call to patient's wife. She explained that the [place] told her to call and ask if contrast (for CT with contrast) would be contraindicated for patient. Advised that contrast will not affect patient's device, but I cannot speak to the necessity of contrast as this would be determined by ordering physician. Patient's wife verbalizes understanding and took down DC phone number if the [place] has any device-related questions or concerns.

## 2018-06-26 ENCOUNTER — Ambulatory Visit (INDEPENDENT_AMBULATORY_CARE_PROVIDER_SITE_OTHER): Payer: Medicare Other

## 2018-06-26 DIAGNOSIS — Z95810 Presence of automatic (implantable) cardiac defibrillator: Secondary | ICD-10-CM

## 2018-06-26 DIAGNOSIS — I5022 Chronic systolic (congestive) heart failure: Secondary | ICD-10-CM

## 2018-06-27 NOTE — Progress Notes (Signed)
EPIC Encounter for ICM Monitoring  Patient Name: [name] [name] is a 72 y.o. male Date: 06/27/2018 Primary Care Physican: Charolett Bumpers, PA-C Primary Cardiologist: Herbie Baltimore  Electrophysiologist: Ladona Ridgel Dry Weight:145lbs  Bi-V Pacing: 96%       Heart Failure questions reviewed, pt asymptomatic now, weight decreased by 3 pounds.   Thoracic impedance returned to normal after taking 2 days of extra Furosemide.  Prescribed: Furosemide 40 mg 1 tablet daily. Per Dr Herbie Baltimore note 03/13/2018,dicusseduse ofsliding scale Lasix.   Labs: 10/27/2016 Creatinine 1.08, BUN 13, Potassium 4.9, Sodium 133  Recommendations: No changes.   Encouraged to call for fluid symptoms.  Follow-up plan: ICM clinic phone appointment on 07/31/2018.    Copy of ICM check sent to Dr. Ladona Ridgel.   3 month ICM trend: 06/26/2018    1 Year ICM trend:       Karie Soda, RN 06/27/2018 8:38 AM

## 2018-07-31 ENCOUNTER — Ambulatory Visit (INDEPENDENT_AMBULATORY_CARE_PROVIDER_SITE_OTHER): Payer: Medicare Other

## 2018-07-31 ENCOUNTER — Ambulatory Visit (INDEPENDENT_AMBULATORY_CARE_PROVIDER_SITE_OTHER): Payer: Medicare Other | Admitting: *Deleted

## 2018-07-31 DIAGNOSIS — I5022 Chronic systolic (congestive) heart failure: Secondary | ICD-10-CM

## 2018-07-31 DIAGNOSIS — Z95810 Presence of automatic (implantable) cardiac defibrillator: Secondary | ICD-10-CM

## 2018-07-31 DIAGNOSIS — I10 Essential (primary) hypertension: Secondary | ICD-10-CM

## 2018-07-31 NOTE — Progress Notes (Signed)
Remote ICD transmission.   

## 2018-08-01 NOTE — Progress Notes (Signed)
EPIC Encounter for ICM Monitoring  Patient Name: [name] [name] is a 72 y.o. male Date: 08/01/2018 Primary Care Physican: Charolett Bumpers, PA-C Primary Cardiologist: Herbie Baltimore  Electrophysiologist: Rae Roam Pacing: 96%  Last Weight: 145lbs  Today's Weight: 142 lbs        Heart Failure questions reviewed, pt asymptomatic.   Thoracic impedance trending just under baseline suggesting fluid accumulation starting 07/30/2018.   Prescribed: Furosemide 40 mg 1 tablet daily. Per Dr Herbie Baltimore note 03/13/2018,dicusseduse ofsliding scale Lasix.   Labs: 10/27/2016 Creatinine 1.08, BUN 13, Potassium 4.9, Sodium 133  Recommendations: No changes.   Encouraged to call for fluid symptoms.  Follow-up plan: ICM clinic phone appointment on 08/31/2018.    Copy of ICM check sent to Dr. Ladona Ridgel and Dr Herbie Baltimore.   3 month ICM trend: 07/31/2018    1 Year ICM trend:       Karie Soda, RN 08/01/2018 10:32 AM

## 2018-08-21 ENCOUNTER — Ambulatory Visit (INDEPENDENT_AMBULATORY_CARE_PROVIDER_SITE_OTHER)
Admission: RE | Admit: 2018-08-21 | Discharge: 2018-08-21 | Disposition: A | Discharge status: Home / Self Care | Payer: Medicare Other | Source: Ambulatory Visit | Attending: Nurse Practitioner | Admitting: Nurse Practitioner

## 2018-08-21 DIAGNOSIS — R911 Solitary pulmonary nodule: Secondary | ICD-10-CM | POA: Diagnosis not present

## 2018-08-21 DIAGNOSIS — R918 Other nonspecific abnormal finding of lung field: Secondary | ICD-10-CM | POA: Diagnosis not present

## 2018-08-21 DIAGNOSIS — J439 Emphysema, unspecified: Secondary | ICD-10-CM | POA: Diagnosis not present

## 2018-08-31 ENCOUNTER — Ambulatory Visit (INDEPENDENT_AMBULATORY_CARE_PROVIDER_SITE_OTHER): Payer: Medicare Other

## 2018-08-31 DIAGNOSIS — I5022 Chronic systolic (congestive) heart failure: Secondary | ICD-10-CM | POA: Diagnosis not present

## 2018-08-31 DIAGNOSIS — Z95810 Presence of automatic (implantable) cardiac defibrillator: Secondary | ICD-10-CM | POA: Diagnosis not present

## 2018-09-01 NOTE — Progress Notes (Signed)
EPIC Encounter for ICM Monitoring  Patient Name: [name] [name] is a 72 y.o. male Date: 09/01/2018 Primary Care Physican: Charolett Bumpers, PA-C Primary Cardiologist: Herbie Baltimore  Electrophysiologist: Rae Roam Pacing: 96%           Last Weight: 142lbs  Today's Weight: unknown                                                           Transmission reviewed   Thoracic impedance trending just under baseline suggesting fluid accumulation starting 07/30/2018.   Prescribed: Furosemide 40 mg 1 tablet daily. Per Dr Herbie Baltimore note 03/13/2018,dicusseduse ofsliding scale Lasix.   Labs: 10/27/2016 Creatinine 1.08, BUN 13, Potassium 4.9, Sodium 133  Recommendations: None  Follow-up plan: ICM clinic phone appointment on 10/09/2018.    Copy of ICM check sent to Dr. Ladona Ridgel.   3 month ICM trend: 08/31/2018    1 Year ICM trend:       Karie Soda, RN 09/01/2018 3:14 PM

## 2018-09-04 ENCOUNTER — Encounter: Payer: Self-pay | Admitting: Cardiology

## 2018-09-04 ENCOUNTER — Ambulatory Visit (INDEPENDENT_AMBULATORY_CARE_PROVIDER_SITE_OTHER): Payer: Medicare Other | Admitting: Cardiology

## 2018-09-04 VITALS — BP 110/72 | HR 76 | Ht 66.0 in | Wt 147.4 lb

## 2018-09-04 DIAGNOSIS — I739 Peripheral vascular disease, unspecified: Secondary | ICD-10-CM | POA: Diagnosis not present

## 2018-09-04 DIAGNOSIS — I472 Ventricular tachycardia, unspecified: Secondary | ICD-10-CM

## 2018-09-04 DIAGNOSIS — I420 Dilated cardiomyopathy: Secondary | ICD-10-CM

## 2018-09-04 DIAGNOSIS — J449 Chronic obstructive pulmonary disease, unspecified: Secondary | ICD-10-CM | POA: Diagnosis not present

## 2018-09-04 DIAGNOSIS — F17200 Nicotine dependence, unspecified, uncomplicated: Secondary | ICD-10-CM | POA: Diagnosis not present

## 2018-09-04 DIAGNOSIS — Z951 Presence of aortocoronary bypass graft: Secondary | ICD-10-CM | POA: Diagnosis not present

## 2018-09-04 DIAGNOSIS — E785 Hyperlipidemia, unspecified: Secondary | ICD-10-CM

## 2018-09-04 DIAGNOSIS — I2510 Atherosclerotic heart disease of native coronary artery without angina pectoris: Secondary | ICD-10-CM

## 2018-09-04 DIAGNOSIS — I10 Essential (primary) hypertension: Secondary | ICD-10-CM | POA: Diagnosis not present

## 2018-09-04 DIAGNOSIS — E1169 Type 2 diabetes mellitus with other specified complication: Secondary | ICD-10-CM | POA: Diagnosis not present

## 2018-09-04 DIAGNOSIS — I5042 Chronic combined systolic (congestive) and diastolic (congestive) heart failure: Secondary | ICD-10-CM

## 2018-09-04 DIAGNOSIS — I25810 Atherosclerosis of coronary artery bypass graft(s) without angina pectoris: Secondary | ICD-10-CM | POA: Diagnosis not present

## 2018-09-04 MED ORDER — FUROSEMIDE 40 MG PO TABS: ORAL_TABLET | ORAL | 6 refills | Status: DC

## 2018-09-04 NOTE — Patient Instructions (Signed)
Medication Instructions:  DECREASE valsartan to 40 mg daily  Alternate taking furosemide (Lasix) 20 mg and 40 mg daily --if your weight increases, increase back to 40 mg daily  If you need a refill on your cardiac medications before your next appointment, please call your pharmacy.   Follow-Up: At [facility], you and your health needs are our priority.  As part of our continuing mission to provide you with exceptional heart care, we have created designated Provider Care Teams.  These Care Teams include your primary Cardiologist (physician) and Advanced Practice Providers (APPs -  Physician Assistants and Nurse Practitioners) who all work together to provide you with the care you need, when you need it. You will need a follow up appointment in 6 months.  Please call our office 2 months in advance to schedule this appointment.  You may see Bryan Lemma, MD or one of the following Advanced Practice Providers on your designated Care Team:   Theodore Demark, PA-C . Joni Reining, DNP, ANP

## 2018-09-04 NOTE — Progress Notes (Signed)
PCP: Merry Proud, PA-C  [facility]: Ph [phone]; Fax # [phone] ;  Primary Cardiologist: Ellyn Hack  Electrophysiologist: [facility] Note: Chief Complaint  Patient presents with  . Follow-up    33-month  Stable  . Coronary Artery Disease    No angina  . Cardiomyopathy    Stable CHF class II     HPI: [name] [name] a 72y.o. male with a PMH CAD-CABG, ICM (VT-ICD) presenting for 6 month f/u.  February 2014: Known CAD- (s/p 3 V CABG) --> diagnosed after his AoBiFem Bypass in Feb 21749complicated by ischemic VT post-operatively, NEVER had any symptoms of Angina --> [facility] relook Cath in July 2014.   Post-op CABG was complicated by Sternal Wound Infxn --> redo-sternotomy with Steel-Plate insertion (on lifelong Dicloxacillin).  After his CABG, he was lost to Cardiology F/u (by his report, the [place] told him to follow-up with Cardiology).  December 2017:  [facility] His presenting symptoms were more consistent with heart failure exacerbation and possible unstable angina.   Echocardiogram revealed EF of roughly 20% & he had sustained VT on monitor (actually the cause for CP --> CATH with no no culprit lesion.  Status post ICD placement by Dr. TLovena LeDecember 2017; remains on Amiodarone.  [facility] [place] Valsartan -> Losartan & now back to Valsartan.  [name] MASTRANGELOwas last seen in June - doing remarkably well.  Noted marked reduction in DLCO so amiodarone was discontinued.  No angina.  No ICD shocks.  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  Somewhat unsteady gait.  Walks with a cane--class II-III claudication with poor balance..  If there is recurrence of A. fib, would consider dofetilide.  ICD working well.  He was seen by Dr. TLovena Leon May 10, 2018 Dec 2018 by both me & Dr. TLovena Le  None -Well.  Is felt a little bit tired and weak if he is on  his feet for long period time.  He says he gives more because of his legs and hips give out before he gets short of breath or his chest tightness.  If he walks slower, he does better.  He does better if he walks with a cane because of balance issues.  Really class I-II heart failure. No EP issues with Dr. TJohny Chessworking well.    Recent Hospitalizations: None  Studies Personally Reviewed - (if available, images/films reviewed: From Epic Chart or Care Everywhere)  CT angios head/brain June 21, 2018: No hemorrhage or extra fluid axial fluid collection.  No cerebral edema.  No mass or midline shift.  Normal ventricles and sulci.  Normal sinuses.  Small focus of calcified plaque within the right ICA prior to entry to the carotid canal without stenosis.  Calcified plaque in the carotid siphons bilaterally.  Likely moderate bilateral stenosis.  Normal MCAs, ACAs and posterior communicating arteries bilaterally.  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  Patent basilar artery.  Due for lower extremity arterial Dopplers in January  Interval History: [name] today overall stable from a cardiac standpoint.  He has not had any anginal symptoms with rest or exertion.  He walks about a mile a day most days of 4 to 5 days a week.  He does have pretty significant balance issues though and has having to walk with a cane  which helps.  He does have claudication but it is hard to tell whether it is balance, claudication or dyspnea that causes him to stop.  He can stop for short period time then get back exercising. 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 He also has noted some orthostatic dizziness when he first stands up after sitting for long period time.  He has  not had any true syncope or near syncope, but has had some pretty close calls. He has no PND or orthopnea -is probably it is more COPD related dyspnea and when he cannot quite lie flat.  No significant edema.  Probably has about class II heart failure symptoms has not had any need for increased dose of Lasix.  Usually his weights and OptiVol usually correlate.  He has been maintaining pretty stable levels and has not had increase his diuretic.  May be takes an additional dose once or twice a month.  He has not had any rapid irregular heartbeats palpitations.  No TIA or amaurosis fugax.  He is still paying close attention to his diet and trying to exercise.   No cough, wheezing or abnormal breathing issues.  ROS: A comprehensive was performed. Review of Systems  Constitutional: Negative for malaise/fatigue.  HENT: Negative for congestion and sinus pain.   Respiratory: Negative for shortness of breath.   Cardiovascular: Positive for claudication (Not sure if this is true claudication or disbalance).       Per history of present illness  Gastrointestinal: Negative for constipation, heartburn, nausea and vomiting.  Genitourinary: Negative for dysuria and urgency.  Musculoskeletal: Positive for joint pain (Normal arthritis pains).  Neurological: Positive for dizziness (Poor balance.  Also has some orthostatic symptoms.) and weakness (Leg fatigue and weakness with prolonged walking).  Endo/Heme/Allergies: Negative for environmental allergies.  Psychiatric/Behavioral: Positive for memory loss.  All other systems reviewed and are negative.   I have reviewed and (if needed) personally updated the patient's problem list, medications, allergies, past medical and surgical history, social and family history.   Past Medical History:  Diagnosis Date  . Acid reflux   . AICD (automatic cardioverter/defibrillator) present    a. 08/2016 St. Jude (serial Number [id number]) biventricular ICD.  [name] Chronic  systolic CHF (congestive heart failure) ([place])    a. 08/2016 Echo: EF 20%, diffuse HK, inf, post AK, mod-sev MR, sev dil LA, mod TR, PASP 61mHg.  .Marland KitchenComplication of anesthesia    "was told I responded well to anesthesia"  . Coronary artery disease involving native coronary artery of native heart with angina pectoris ([place]    a. 10/2012 MI after aortobifem bypass, found to have multivessel CAD -->CABG x3;  b. 08/2016 Cath: LM 60, LAD 969mLCX 60ost/p, RCA 100p, VG->RPDA 50p, VG->OM1 ok, LIMA->LAD ok, EF 25%.  . Essential hypertension 03/03/2012  . History of blood transfusion 11/2012   "during his CABG"  . History of gout X 1  . History of stomach ulcers 1970s  . Hyperlipidemia associated with type 2 diabetes mellitus (HC[place]12/27/2012  . Ischemic cardiomyopathy    a. 08/2016 Echo: EF 20%, diffuse HK, inf, post AK;  b. 08/2016 St. Jude (serial Number [id number] ICD.  . Marland KitchenAD (peripheral artery disease) (HCMoken[place]   s/p aortobifemoral bypass 10/2012  . PTSD (post-traumatic stress disorder)   .  Type II diabetes mellitus ([place])   . Ventricular tachycardia ([place])    a. 08/2016 St. Jude (serial Number [id number]) biventricular ICD-->oral amio added.  . 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 Is now on chronic suppressive antibiotics with dicloxacillin    Past Surgical History:  Procedure Laterality Date  . AORTO-FEMORAL BYPASS GRAFT Bilateral 10/2012   [facility]  . BI-VENTRICULAR IMPLANTABLE CARDIOVERTER DEFIBRILLATOR  (CRT-D)  09/03/2016  . CARDIAC CATHETERIZATION  11/2012   [facility] (? for abnormal Nuclear ST) --> no PCI, by report, patent grafts.  [name] CARDIAC CATHETERIZATION N/A 09/02/2016   Procedure: Left Heart Cath and Cors/Grafts Angiography;  Surgeon: Burnell Blanks, MD;  Location: [place] CV LAB;  Service: Cardiovascular;  Laterality: N/A;  .  CARDIAC CATHETERIZATION  10/2012; 03/2013  . CHOLECYSTECTOMY  03/05/2012   Procedure: LAPAROSCOPIC CHOLECYSTECTOMY WITH INTRAOPERATIVE CHOLANGIOGRAM;  Surgeon: Rolm Bookbinder, MD;  Location: [place];  Service: General;  Laterality: N/A;  . CORONARY ARTERY BYPASS GRAFT  11/2012   CABG X 3;  [facility]  . EP IMPLANTABLE DEVICE N/A 09/03/2016   Procedure: BiV ICD Insertion CRT-D;  Surgeon: Evans Lance, MD;  Location: [place] CV LAB;  Service: Cardiovascular;  Laterality: N/A;  . INGUINAL HERNIA REPAIR Left 1990s  . STERNOTOMY  2015   re-do sternotomy with metal place "sheild" placed. -Has chronic staph infection, on chronic suppressive medication  . TRANSTHORACIC ECHOCARDIOGRAM  02/28/2017   EF remains 20p-25% severely reduced function. Diffuse hypokinesis. "Grade 1 diastolic dysfunction ". Severely calcified aortic valve with restricted mobility (functioning bicuspid) however no suggestion of stenosis.  [name] UMBILICAL HERNIA REPAIR  03/05/2012   Procedure: HERNIA REPAIR UMBILICAL ADULT;  Surgeon: Rolm Bookbinder, MD;  Location: [place];  Service: General;  Laterality: N/A;   Cath 08/2016.     Current Meds  Medication Sig  . albuterol (PROVENTIL HFA) 108 (90 Base) MCG/ACT inhaler Inhale 1-2 puffs into the lungs every 6 (six) hours as needed for wheezing or shortness of breath.  [name] aspirin EC 81 MG tablet Take 81 mg by mouth daily.    [name] atorvastatin (LIPITOR) 40 MG tablet Take 1 tablet (40 mg total) by mouth daily.  . carvedilol (COREG) 6.25 MG tablet Take 1 tablet (6.25 mg total) by mouth 2 (two) times daily with a meal.  . Cholecalciferol (VITAMIN D-3 PO) Take 2,000 mg by mouth daily.   . dicloxacillin (DYNAPEN) 250 MG capsule Take 500 mg by mouth 3 (three) times daily.  . fluticasone (FLONASE ALLERGY RELIEF) 50 MCG/ACT nasal spray Place 2 sprays into both nostrils daily as needed for allergies or rhinitis.  . furosemide (LASIX) 40 MG tablet Alternate taking 20 mg daily and 40 mg daily  .  glipiZIDE (GLUCOTROL) 5 MG tablet Take 2.5 mg by mouth daily before breakfast.  . magnesium oxide (MAG-OX) 400 (241.3 Mg) MG tablet Take 1 tablet (400 mg total) by mouth daily.  . metFORMIN (GLUCOPHAGE) 1000 MG tablet Take 1 tablet (1,000 mg total) by mouth 2 (two) times daily with a meal. Resume on 09/06/2016  . nitroGLYCERIN (NITROSTAT) 0.4 MG SL tablet Place 1 tablet (0.4 mg total) under the tongue every 5 (five) minutes x 3 doses as needed for chest pain.  [name] omeprazole (PRILOSEC) 20 MG capsule Take 20 mg by mouth daily.  . ranitidine (ZANTAC) 150 MG tablet Take 150 mg by mouth daily as  needed for heartburn.   . Tiotropium Bromide Monohydrate (SPIRIVA HANDIHALER IN) Inhale 18 mcg into the lungs daily.  . valsartan (DIOVAN) 40 MG tablet Take 40 mg by mouth daily.  . [DISCONTINUED] furosemide (LASIX) 40 MG tablet Take 1 tablet (40 mg total) by mouth daily.  Also taking valsartan 80 mg daily  No Known Allergies  Social History   Tobacco Use  . Smoking status: Current Every Day Smoker    Types: Pipe  . Smokeless tobacco: Former Systems developer    Types: Snuff, Chew  . Tobacco comment: 09/03/2016 "used smokeless tobacco in my teens; quit smoking cigarettes in the early 1980s; smokes pipe only"  Substance Use Topics  . Alcohol use: No  . Drug use: No   Social History   Social History Narrative  . Not on file   --He says he smokes a pipe, but this mostly consist of lighting up the pipe and smelling the back of burn.  He barely inhales once or twice just to get it going now.  family history includes Cancer in his brother.  Wt Readings from Last 3 Encounters:  09/04/18 147 lb 6.4 oz (66.9 kg)  05/10/18 153 lb (69.4 kg)  04/27/18 150 lb (68 kg)  June 2019 - 152 lb  PHYSICAL EXAM BP 110/72   Pulse 76   Ht '5\' 6"'  (1.676 m)   Wt 147 lb 6.4 oz (66.9 kg)   SpO2 98%   BMI 23.79 kg/m   Physical Exam  Constitutional: He is oriented to person, place, and time. He appears well-developed and  well-nourished. No distress.  Well-groomed.  Has chronic ill appearance to him.  Appears older than stated age.  HENT:  Head: Normocephalic and atraumatic.  Mouth/Throat: No oropharyngeal exudate.  Very poor dentition  Neck: Normal range of motion. Neck supple. No hepatojugular reflux and no JVD present. Carotid bruit is present (Soft bilateral).  Cardiovascular: Normal rate, regular rhythm, S1 normal and S2 normal.  Occasional extrasystoles are present. PMI is not displaced. Exam reveals gallop (Soft S4) and decreased pulses (Mildly reduced pedal pulses bilaterally.). Exam reveals no friction rub.  No murmur heard. Pulmonary/Chest: Effort normal. No respiratory distress. He has wheezes (Faint inspiratory next Tory).  Mild intermittent crackles but no rales or rhonchi.  Abdominal: Soft. Bowel sounds are normal. He exhibits no distension. There is no abdominal tenderness. There is no rebound.  No HSM  Musculoskeletal: Normal range of motion.        General: Edema (Minimal) present.  Neurological: He is alert and oriented to person, place, and time.  Psychiatric: He has a normal mood and affect. His behavior is normal. Judgment and thought content normal.  Nursing note and vitals reviewed.    Adult ECG Report N/a  Other studies Reviewed: Additional studies/ records that were reviewed today include:  Recent Labs:  Labs [facility] checked by [facility] -they were just checked recently, but not available.  From [place], September 2019: A1c 7.8.  Normal LFTs.  (AST 14, ALT 17, alk phos 78.  CBC: W8.7, H/H 13.8/40.3, platelet 289.  BUN 9, Cr 1.0.  NA 36, K+ 4.2, calcium 8.6, glucose 95.  TSH 1.89, free T4 1.29.  Folate 9.1, B12 612--no lipids.    Last lipids available are from 09/08/2016 -TC 87, TG 92, HDL 26, LDL 43.   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  Relatively euvolemic.  Plan: Problems with orthostatic hypotension and fatigue  Reduce valsartan from 80 mg to 40 mg.  Reduce Lasix to alternating 40 mg & 20 mg every other day.  Sliding scale in place.  Continue carvedilol.  (However if continues to have orthostatic symptoms, may need to reduce morning dose to 3.25 mg.      Relevant Medications   valsartan (DIOVAN) 40 MG tablet   furosemide (LASIX) 40 MG tablet   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, but Also Potentially Arrhythmogenic (Chronic)    EF remains low despite medical management.  Relatively euvolemic today.  He is on Lasix 40 mg daily, but is having orthostatic symptoms, [name] [name] reduce this to alternating 40 mg 1 day and 20 mg another day.  Muscular reduce his valsartan dose to 40 mg daily. We will continue current dose of carvedilol.      Relevant Medications   valsartan (DIOVAN) 40 MG tablet   furosemide (LASIX) 40 MG tablet   COPD without exacerbation (HCC) (Chronic)    COPD is probably the main reason why short of breath somewhat complicated by CHF. We stopped amiodarone for changes DLCO.  He is not on home oxygen, although a question may be should be.  Defer to PCP, but apparently he has been referred to pulmonary medicine.      Coronary artery disease involving native coronary artery of native heart without angina pectoris (Chronic)    Essentially occluded native vessels, CABG graft dependent.  Has a LIMA to LAD, vein graft to OM and vein graft PDA. Not really having any anginal symptoms. Remains on 625 mg twice daily Coreg as well as aspirin and statin. Is currently on ARB which I will reduce the dose.  No requirement for nitroglycerin.      Relevant Medications   valsartan (DIOVAN) 40 MG tablet   furosemide (LASIX) 40 MG tablet   Essential hypertension (Chronic)    If anything has mild orthostasis.  Reducing ARB dose, continue carvedilol dose at present,  but low threshold to reduce morning dose of carvedilol next visit.      Relevant Medications   valsartan (DIOVAN) 40 MG tablet   furosemide (LASIX) 40 MG tablet   Hyperlipidemia associated with type 2 diabetes mellitus ([place]) (Chronic)    I have not seen labs since 2017.  I have asked that he get a copy of labs from his [place] visits.  He should be due for lipid panel anytime now.  Unfortunately the most recent labs that he brought with him did not have lipids.  He is on atorvastatin 40 mg daily and tolerating it relatively well.  As of 2017, well controlled.      Relevant Medications   valsartan (DIOVAN) 40 MG tablet   PAD (peripheral artery disease) (HCC) (Chronic)    Is due for AAA and lower extremity Dopplers in January..  Not lifestyle limiting claudication, Dopplers have not shown severe disease.      Relevant Medications   valsartan (DIOVAN) 40 MG tablet   furosemide (LASIX) 40 MG tablet   S/P CABG x 3   Tobacco use disorder (Chronic)    Smokes a pipe whenever he wants to.  No desire to quit.      Ventricular tachycardia (HCC) (Chronic)    Appears to maintain sinus rhythm.  No longer on amiodarone.  Is still on beta-blocker.  Has ICD in place.  Relevant Medications   valsartan (DIOVAN) 40 MG tablet   furosemide (LASIX) 40 MG tablet      Current medicines are reviewed at length with the patient today. (+/- concerns) n/a The following changes have been made: n/a  Patient Instructions  Medication Instructions:  DECREASE valsartan to 40 mg daily  Alternate taking furosemide (Lasix) 20 mg and 40 mg daily --if your weight increases, increase back to 40 mg daily  If you need a refill on your cardiac medications before your next appointment, please call your pharmacy.   Follow-Up: At [facility], you and your health needs are our priority.  As part of our continuing mission to provide you with exceptional heart care, we have created designated Provider Care Teams.   These Care Teams include your primary Cardiologist (physician) and Advanced Practice Providers (APPs -  Physician Assistants and Nurse Practitioners) who all work together to provide you with the care you need, when you need it. You will need a follow up appointment in 6 months.  Please call our office 2 months in advance to schedule this appointment.  You may see Glenetta Hew, MD or one of the following Advanced Practice Providers on your designated Care Team:   Rosaria Ferries, PA-C . Jory Sims, DNP, ANP      Studies Ordered:   No orders of the defined types were placed in this encounter.     Glenetta Hew, M.D., M.S. Interventional Cardiologist   Pager # [phone] Phone # [phone] [address] [place], [place] [address]

## 2018-09-06 ENCOUNTER — Encounter: Payer: Self-pay | Admitting: Cardiology

## 2018-09-06 NOTE — Assessment & Plan Note (Signed)
If anything has mild orthostasis.  Reducing ARB dose, continue carvedilol dose at present, but low threshold to reduce morning dose of carvedilol next visit.

## 2018-09-06 NOTE — Assessment & Plan Note (Signed)
Smokes a pipe whenever he wants to.  No desire to quit.

## 2018-09-06 NOTE — Assessment & Plan Note (Signed)
I have not seen labs since 2017.  I have asked that he get a copy of labs from [facility].  He should be due for lipid panel anytime now.  Unfortunately the most recent labs that he brought with him did not have lipids.  He is on atorvastatin 40 mg daily and tolerating it relatively well.  As of 2017, well controlled.

## 2018-09-06 NOTE — Assessment & Plan Note (Signed)
COPD is probably the main reason why short of breath somewhat complicated by CHF. We stopped amiodarone for changes DLCO.  He is not on home oxygen, although a question may be should be.  Defer to PCP, but apparently he has been referred to pulmonary medicine.

## 2018-09-06 NOTE — Assessment & Plan Note (Signed)
Minimal, class II symptoms of heart failure.  Relatively euvolemic.  Plan: Problems with orthostatic hypotension and fatigue  Reduce valsartan from 80 mg to 40 mg.  Reduce Lasix to alternating 40 mg & 20 mg every other day.  Sliding scale in place.  Continue carvedilol.  (However if continues to have orthostatic symptoms, may need to reduce morning dose to 3.25 mg.

## 2018-09-06 NOTE — Assessment & Plan Note (Signed)
Appears to maintain sinus rhythm.  No longer on amiodarone.  Is still on beta-blocker.  Has ICD in place.

## 2018-09-06 NOTE — Assessment & Plan Note (Addendum)
Essentially occluded native vessels, CABG graft dependent.  Has a LIMA to LAD, vein graft to OM and vein graft PDA. Not really having any anginal symptoms. Remains on 625 mg twice daily Coreg as well as aspirin and statin. Is currently on ARB which I will reduce the dose.  No requirement for nitroglycerin.

## 2018-09-06 NOTE — Assessment & Plan Note (Signed)
Is due for AAA and lower extremity Dopplers in January..  Not lifestyle limiting claudication, Dopplers have not shown severe disease.

## 2018-09-06 NOTE — Assessment & Plan Note (Signed)
EF remains low despite medical management.  Relatively euvolemic today.  He is on Lasix 40 mg daily, but is having orthostatic symptoms, Emina reduce this to alternating 40 mg 1 day and 20 mg another day.  Muscular reduce his valsartan dose to 40 mg daily. We will continue current dose of carvedilol.

## 2018-09-21 ENCOUNTER — Other Ambulatory Visit (HOSPITAL_COMMUNITY): Payer: Self-pay | Admitting: Cardiology

## 2018-09-21 DIAGNOSIS — I714 Abdominal aortic aneurysm, without rupture, unspecified: Secondary | ICD-10-CM

## 2018-09-21 DIAGNOSIS — I739 Peripheral vascular disease, unspecified: Secondary | ICD-10-CM

## 2018-09-25 ENCOUNTER — Other Ambulatory Visit: Payer: Self-pay | Admitting: Pulmonary Disease

## 2018-09-25 DIAGNOSIS — J449 Chronic obstructive pulmonary disease, unspecified: Secondary | ICD-10-CM

## 2018-09-25 DIAGNOSIS — J849 Interstitial pulmonary disease, unspecified: Secondary | ICD-10-CM

## 2018-09-27 ENCOUNTER — Ambulatory Visit (INDEPENDENT_AMBULATORY_CARE_PROVIDER_SITE_OTHER): Payer: Medicare Other | Admitting: Pulmonary Disease

## 2018-09-27 ENCOUNTER — Encounter: Payer: Self-pay | Admitting: Pulmonary Disease

## 2018-09-27 VITALS — BP 112/64 | HR 73 | Ht 66.0 in | Wt 146.6 lb

## 2018-09-27 DIAGNOSIS — F17290 Nicotine dependence, other tobacco product, uncomplicated: Secondary | ICD-10-CM | POA: Diagnosis not present

## 2018-09-27 DIAGNOSIS — J439 Emphysema, unspecified: Secondary | ICD-10-CM

## 2018-09-27 DIAGNOSIS — J449 Chronic obstructive pulmonary disease, unspecified: Secondary | ICD-10-CM

## 2018-09-27 DIAGNOSIS — J849 Interstitial pulmonary disease, unspecified: Secondary | ICD-10-CM

## 2018-09-27 LAB — PULMONARY FUNCTION TEST
DL/VA % pred: 58 %
DL/VA: 2.55 ml/min/mmHg/L
DLCO unc % pred: 51 %
DLCO unc: 13.98 ml/min/mmHg
FEF 25-75 Post: 1.43 L/sec
FEF 25-75 Pre: 1.11 L/sec
FEF2575-%Change-Post: 28 %
FEF2575-%Pred-Post: 67 %
FEF2575-%Pred-Pre: 52 %
FEV1-%Change-Post: 8 %
FEV1-%Pred-Post: 80 %
FEV1-%Pred-Pre: 74 %
FEV1-Post: 2.23 L
FEV1-Pre: 2.06 L
FEV1FVC-%CHANGE-POST: 6 %
FEV1FVC-%Pred-Pre: 86 %
FEV6-%Change-Post: 1 %
FEV6-%Pred-Post: 92 %
FEV6-%Pred-Pre: 90 %
FEV6-Post: 3.26 L
FEV6-Pre: 3.21 L
FEV6FVC-%Change-Post: 0 %
FEV6FVC-%Pred-Post: 105 %
FEV6FVC-%Pred-Pre: 104 %
FVC-%CHANGE-POST: 1 %
FVC-%PRED-PRE: 86 %
FVC-%Pred-Post: 87 %
FVC-POST: 3.29 L
FVC-Pre: 3.25 L
Post FEV1/FVC ratio: 68 %
Post FEV6/FVC ratio: 99 %
Pre FEV1/FVC ratio: 63 %
Pre FEV6/FVC Ratio: 99 %
RV % pred: 151 %
RV: 3.37 L
TLC % pred: 108 %
TLC: 6.76 L

## 2018-09-27 NOTE — Progress Notes (Signed)
PFT completed today.  

## 2018-09-27 NOTE — Progress Notes (Signed)
[facility]  Chief Complaint  Patient presents with  . Follow-up    States that his breathing is improved since last OV. PFT was completed today.    Constitutional:  BP 112/64 (BP Location: Left Arm, Cuff Size: Normal)   Pulse 73   Ht 5\' 6"  (1.676 m)   Wt 146 lb 9.6 oz (66.5 kg)   SpO2 97%   BMI 23.66 kg/m   Past Medical History:  VT, DM, PTSD, PAD, ischemic CM, HLD, PUD, Gout, HTN, CAD, a/p AICD, GERD  Brief Summary:  [name] [name] is a 73 y.o. male smoker with emphysema and ILD.  CT chest in December reviewed by me showed stable ILD, emphysema, and lung nodules.  PFT today showed mild obstruction, moderate diffusion defect.  No change from prior test.  He is not having cough, wheeze, sputum, or chest pain.  Still smokes a pipe.   Physical Exam:   Appearance - well kempt   ENMT - clear nasal mucosa, midline nasal  septum, no oral exudates, no LAN, trachea midline  Respiratory - normal chest wall, normal respiratory effort, no accessory muscle use, no wheeze/rales  CV - s1s2 regular rate and rhythm, no murmurs, no peripheral edema, radial pulses symmetric  GI - soft, non tender, no masses  Lymph - no adenopathy noted in neck and axillary areas  MSK - normal gait  Ext - no cyanosis, clubbing, or joint inflammation noted  Skin - no rashes, lesions, or ulcers  Neuro - normal strength, oriented x 3  Psych - normal mood and affect  Assessment/Plan:   COPD with emphysema. 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 - CT imaging and PFT stable - he is now off amiodarone  Lung nodules. - will need f/u CT chest in December 2020  Pipe smoker. - discussed importance of avoiding tobacco products   Patient Instructions  Follow up in 6 months  Time spent 26 minutes  Coralyn HellingVineet Adib Wahba, MD [facility] Pager: [phone] 09/27/2018, 3:23 PM  Flow  Sheet     Pulmonary tests:  PFT 09/22/16 >> FEV1 1.62 (58%), FEV1% 58, TLC 6.68 (107%), DLCO 53%, increased RV:TLC PFT 03/20/18 >> FEV1 2.12 (77%), FEV1% 64, TLC 5.81 (93%), DLCO 47% PFT 09/27/18 >> FEV1 2.23 (80%), FEV1% 68, TLC 6.76 (108%), DLCO 51%  Sleep tests:  HRCT chest 04/07/18 >> LLL 7 mm nodule, patchy peribronchovascular and subpleural reticulation and GGO b/l CT chest 08/21/18 >> interstitial and patchy GGO, 5 mm RLL nodule, 7 mm LLL nodule, centrilobular emphysema  Cardiac tests:  Echo 02/28/17 mild LVH, EF 20 to 25%, grade 1 DD, bicuspid AV  Medications:   Allergies as of 09/27/2018   No Known Allergies     Medication List       Accurate as of September 27, 2018  3:23 PM. Always use your most recent med list.        Alpha-Lipoic Acid 600 MG Tabs Take 2 tablets by mouth 2 (two) times daily.   aspirin EC 81 MG tablet Take 81 mg by mouth daily.   atorvastatin 40 MG tablet Commonly known as:  LIPITOR Take 1 tablet (40 mg total) by mouth daily.   carvedilol 6.25 MG tablet Commonly known as:  COREG Take 1 tablet (6.25 mg total) by mouth 2 (two) times daily with a meal.   dicloxacillin 250 MG capsule Commonly known as:  DYNAPEN Take 500 mg by mouth 3 (  three) times daily.   FLONASE ALLERGY RELIEF 50 MCG/ACT nasal spray Generic drug:  fluticasone Place 2 sprays into both nostrils daily as needed for allergies or rhinitis.   furosemide 40 MG tablet Commonly known as:  LASIX Alternate taking 20 mg daily and 40 mg daily   glipiZIDE 5 MG tablet Commonly known as:  GLUCOTROL Take 2.5 mg by mouth daily before breakfast.   magnesium oxide 400 (241.3 Mg) MG tablet Commonly known as:  MAG-OX Take 1 tablet (400 mg total) by mouth daily.   metFORMIN 1000 MG tablet Commonly known as:  GLUCOPHAGE Take 1 tablet (1,000 mg total) by mouth 2 (two) times daily with a meal. Resume on 09/06/2016   nitroGLYCERIN 0.4 MG SL tablet Commonly known as:  NITROSTAT Place 1 tablet  (0.4 mg total) under the tongue every 5 (five) minutes x 3 doses as needed for chest pain.   omeprazole 20 MG capsule Commonly known as:  PRILOSEC Take 20 mg by mouth daily.   PROVENTIL HFA 108 (90 Base) MCG/ACT inhaler Generic drug:  albuterol Inhale 1-2 puffs into the lungs every 6 (six) hours as needed for wheezing or shortness of breath.   SPIRIVA HANDIHALER IN Inhale 18 mcg into the lungs daily.   valsartan 40 MG tablet Commonly known as:  DIOVAN Take 40 mg by mouth daily.   VITAMIN D-3 PO Take 2,000 mg by mouth daily.       Past Surgical History:  He  has a past surgical history that includes Cholecystectomy (03/05/2012); Umbilical hernia repair (03/05/2012); Aorto-femoral Bypass Graft (Bilateral, 10/2012); Bi-ventricular implantable cardioverter defibrillator  (crt-d) (09/03/2016); Inguinal hernia repair (Left, 1990s); Sternotomy (2015); Coronary artery bypass graft (11/2012); Cardiac catheterization (11/2012); Cardiac catheterization (N/A, 09/02/2016); Cardiac catheterization (10/2012; 03/2013); Cardiac catheterization (N/A, 09/03/2016); and transthoracic echocardiogram (02/28/2017).  Family History:  His family history includes Cancer in his brother.  Social History:  He  reports that he has been smoking pipe. He has quit using smokeless tobacco.  His smokeless tobacco use included snuff and chew. He reports that he does not drink alcohol or use drugs.

## 2018-09-27 NOTE — Patient Instructions (Signed)
Follow up in 6 months 

## 2018-10-01 LAB — CUP PACEART REMOTE DEVICE CHECK
Battery Remaining Longevity: 62 mo
Battery Voltage: 2.96 V
Brady Statistic AP VP Percent: 1 %
Brady Statistic AP VS Percent: 1 %
Brady Statistic AS VP Percent: 94 %
Brady Statistic AS VS Percent: 1.9 %
Brady Statistic RA Percent Paced: 1 %
Date Time Interrogation Session: 20191111070020
HighPow Impedance: 55 Ohm
HighPow Impedance: 55 Ohm
Implantable Lead Implant Date: 20171215
Implantable Lead Implant Date: 20171215
Implantable Lead Implant Date: 20171215
Implantable Lead Location: 753858
Implantable Lead Location: 753859
Implantable Pulse Generator Implant Date: 20171215
Lead Channel Impedance Value: 410 Ohm
Lead Channel Pacing Threshold Amplitude: 0.5 V
Lead Channel Pacing Threshold Amplitude: 0.75 V
Lead Channel Pacing Threshold Amplitude: 1.25 V
Lead Channel Pacing Threshold Pulse Width: 0.5 ms
Lead Channel Pacing Threshold Pulse Width: 0.5 ms
Lead Channel Pacing Threshold Pulse Width: 0.5 ms
Lead Channel Sensing Intrinsic Amplitude: 1.9 mV
Lead Channel Sensing Intrinsic Amplitude: 4.8 mV
Lead Channel Setting Pacing Amplitude: 2 V
Lead Channel Setting Pacing Amplitude: 2 V
Lead Channel Setting Pacing Pulse Width: 0.5 ms
Lead Channel Setting Pacing Pulse Width: 0.5 ms
Lead Channel Setting Sensing Sensitivity: 0.5 mV
MDC IDC LEAD LOCATION: 753860
MDC IDC MSMT BATTERY REMAINING PERCENTAGE: 72 %
MDC IDC MSMT LEADCHNL LV IMPEDANCE VALUE: 380 Ohm
MDC IDC MSMT LEADCHNL RV IMPEDANCE VALUE: 510 Ohm
MDC IDC SET LEADCHNL RA PACING AMPLITUDE: 2 V
Pulse Gen Serial Number: 7368976

## 2018-10-02 ENCOUNTER — Inpatient Hospital Stay (HOSPITAL_COMMUNITY): Admission: RE | Admit: 2018-10-02 | Payer: Medicare Other | Source: Ambulatory Visit

## 2018-10-03 ENCOUNTER — Ambulatory Visit (HOSPITAL_BASED_OUTPATIENT_CLINIC_OR_DEPARTMENT_OTHER)
Admission: RE | Admit: 2018-10-03 | Discharge: 2018-10-03 | Disposition: A | Discharge status: Home / Self Care | Payer: Medicare Other | Source: Ambulatory Visit | Attending: Cardiology | Admitting: Cardiology

## 2018-10-03 ENCOUNTER — Ambulatory Visit (HOSPITAL_COMMUNITY)
Admission: RE | Admit: 2018-10-03 | Discharge: 2018-10-03 | Disposition: A | Discharge status: Home / Self Care | Payer: Medicare Other | Source: Ambulatory Visit | Attending: Cardiovascular Disease | Admitting: Cardiovascular Disease

## 2018-10-03 DIAGNOSIS — Z95828 Presence of other vascular implants and grafts: Secondary | ICD-10-CM | POA: Diagnosis not present

## 2018-10-03 DIAGNOSIS — I739 Peripheral vascular disease, unspecified: Secondary | ICD-10-CM | POA: Diagnosis not present

## 2018-10-03 DIAGNOSIS — I714 Abdominal aortic aneurysm, without rupture, unspecified: Secondary | ICD-10-CM

## 2018-10-05 ENCOUNTER — Telehealth: Payer: Self-pay | Admitting: *Deleted

## 2018-10-05 DIAGNOSIS — I739 Peripheral vascular disease, unspecified: Secondary | ICD-10-CM

## 2018-10-05 DIAGNOSIS — Z95828 Presence of other vascular implants and grafts: Secondary | ICD-10-CM

## 2018-10-05 DIAGNOSIS — I2510 Atherosclerotic heart disease of native coronary artery without angina pectoris: Secondary | ICD-10-CM

## 2018-10-05 DIAGNOSIS — E119 Type 2 diabetes mellitus without complications: Secondary | ICD-10-CM

## 2018-10-05 NOTE — Telephone Encounter (Signed)
-----   Message from Marykay Lex, MD sent at 10/03/2018  6:02 PM EST ----- Lower extremity arterial Dopplers look pretty normal.  ABIs are pretty well controlled suggesting no significant upstream artery disease -suggesting that the aortobifem bypass is open.  The toe brachial indexes are abnormal suggesting that there may be some disease in the below the knee arteries.  Will check to see if there is benefit from checking full Arterial Dopplers - BUT we usually do any significant procedural treatments of the small arteries unless there is a significant nonhealing wound.  The recommendation that he continue to walk and monitor for signs of wounds or sores on the feet.  Bryan Lemma, MD

## 2018-10-05 NOTE — Telephone Encounter (Signed)
PATIENT REVIEWED INFORMATION FROM BOTH TEST IN [place]. PATIENT DID NOT HAVE ANY QUESTION . AWARE WILL RECHECK ABI PER  REPORT . ORDER PLACED  PATIENT VERBALIZED UNDERSTANDING

## 2018-10-05 NOTE — Telephone Encounter (Signed)
-----   Message from Marykay Lex, MD sent at 10/03/2018  6:03 PM EST ----- Abdominal aortic Doppler shows no evidence of aneurysm.  Mild dilation only 2.7 mm.  Widely patent aortobifem graft with moderate disease in the left major thigh artery.  This is what the ABIs suggested.  Bryan Lemma, MD

## 2018-10-09 ENCOUNTER — Ambulatory Visit (INDEPENDENT_AMBULATORY_CARE_PROVIDER_SITE_OTHER): Payer: Medicare Other

## 2018-10-09 DIAGNOSIS — Z95810 Presence of automatic (implantable) cardiac defibrillator: Secondary | ICD-10-CM | POA: Diagnosis not present

## 2018-10-09 DIAGNOSIS — I5042 Chronic combined systolic (congestive) and diastolic (congestive) heart failure: Secondary | ICD-10-CM

## 2018-10-10 ENCOUNTER — Telehealth: Payer: Self-pay

## 2018-10-10 NOTE — Progress Notes (Signed)
EPIC Encounter for ICM Monitoring  Patient Name: [name] [name] is a 73 y.o. male Date: 10/10/2018 Primary Care Physican: Merry Proud, PA-C Primary Cardiologist: Ellyn Hack  Electrophysiologist: Santina Evans Pacing: 95% Last Weight:142lbs Today's Weight:unknown   Attempted call to patient.  Left detailed message per DPR regarding transmission. Transmission reviewed.   Thoracic impedancenormal.  Prescribed:Furosemide 40 mg 1 alternate taking 20 mg daily and 40 mg daily and per 09/04/2018 office note with Dr Ellyn Hack, if weight increases, increase back to 40 mg daily.  Labs: 06/12/2018 Creatinine 1.0, BUN 9, Potassium 4.2, Sodium 136, eGFR 78.5 12/09/2017 Creatinine 0.9, BUN 7, Potassium 4.4, Sodium 134, eGFR 88.7  A complete set of results can be found in Results Review.  Recommendations:Left voice mail with ICM number and encouraged to call if experiencing any fluid symptoms.  Follow-up plan: ICM clinic phone appointment on1/20/2020.   Copy of ICM check sent to Dr.Taylor.   3 month ICM trend: 10/09/2018    1 Year ICM trend:       Rosalene Billings, RN 10/10/2018 3:39 PM

## 2018-10-10 NOTE — Telephone Encounter (Signed)
Remote ICM transmission received.  Attempted call to patient regarding ICM remote transmission and left detailed message, per DPR, with next ICM remote transmission date of 11/13/2018.  Advised to return call for any fluid symptoms or questions.   

## 2018-10-30 ENCOUNTER — Ambulatory Visit (INDEPENDENT_AMBULATORY_CARE_PROVIDER_SITE_OTHER): Payer: Medicare Other

## 2018-10-30 DIAGNOSIS — I5042 Chronic combined systolic (congestive) and diastolic (congestive) heart failure: Secondary | ICD-10-CM | POA: Diagnosis not present

## 2018-10-30 DIAGNOSIS — I255 Ischemic cardiomyopathy: Secondary | ICD-10-CM

## 2018-10-31 LAB — CUP PACEART REMOTE DEVICE CHECK
Battery Remaining Longevity: 59 mo
Battery Remaining Percentage: 69 %
Battery Voltage: 2.96 V
Brady Statistic AP VS Percent: 1 %
Brady Statistic AS VS Percent: 2 %
HIGH POWER IMPEDANCE MEASURED VALUE: 54 Ohm
HighPow Impedance: 54 Ohm
Implantable Lead Implant Date: 20171215
Implantable Lead Implant Date: 20171215
Implantable Lead Implant Date: 20171215
Implantable Lead Location: 753859
Implantable Lead Location: 753860
Implantable Lead Model: 7122
Implantable Pulse Generator Implant Date: 20171215
Lead Channel Impedance Value: 380 Ohm
Lead Channel Impedance Value: 410 Ohm
Lead Channel Impedance Value: 490 Ohm
Lead Channel Pacing Threshold Amplitude: 0.5 V
Lead Channel Pacing Threshold Amplitude: 0.75 V
Lead Channel Pacing Threshold Amplitude: 1.25 V
Lead Channel Pacing Threshold Pulse Width: 0.5 ms
Lead Channel Pacing Threshold Pulse Width: 0.5 ms
Lead Channel Pacing Threshold Pulse Width: 0.5 ms
Lead Channel Sensing Intrinsic Amplitude: 11.7 mV
Lead Channel Sensing Intrinsic Amplitude: 2.6 mV
Lead Channel Setting Pacing Amplitude: 2 V
Lead Channel Setting Pacing Amplitude: 2 V
Lead Channel Setting Pacing Amplitude: 2 V
Lead Channel Setting Pacing Pulse Width: 0.5 ms
Lead Channel Setting Pacing Pulse Width: 0.5 ms
Lead Channel Setting Sensing Sensitivity: 0.5 mV
MDC IDC LEAD LOCATION: 753858
MDC IDC SESS DTM: 20200210070017
MDC IDC STAT BRADY AP VP PERCENT: 1.4 %
MDC IDC STAT BRADY AS VP PERCENT: 93 %
MDC IDC STAT BRADY RA PERCENT PACED: 1 %
Pulse Gen Serial Number: 7368976

## 2018-11-10 NOTE — Progress Notes (Signed)
Remote ICD transmission.   

## 2018-11-13 ENCOUNTER — Ambulatory Visit (INDEPENDENT_AMBULATORY_CARE_PROVIDER_SITE_OTHER): Payer: Medicare Other

## 2018-11-13 DIAGNOSIS — Z95810 Presence of automatic (implantable) cardiac defibrillator: Secondary | ICD-10-CM | POA: Diagnosis not present

## 2018-11-13 DIAGNOSIS — I5042 Chronic combined systolic (congestive) and diastolic (congestive) heart failure: Secondary | ICD-10-CM | POA: Diagnosis not present

## 2018-11-13 NOTE — Progress Notes (Signed)
EPIC Encounter for ICM Monitoring  Patient Name: [name] [name] is a 73 y.o. male Date: 11/13/2018 Primary Care Physican: Merry Proud, PA-C Primary Cardiologist: Ellyn Hack  Electrophysiologist: Santina Evans Pacing: 94% Last Weight:142lbs Today's Weight:144 lbs   Heart failure questions reviewed.  Denies fluid symptoms and states he is feeling fine.    Thoracic impedancetrending just below normal.  Prescribed:Furosemide 40 mg Alternate taking 20 mg daily and 40 mg daily and per 09/04/2018 office note with Dr Ellyn Hack, if weight increases, increase back to 40 mg daily.  Labs: 06/12/2018 Creatinine 1.0, BUN 9, Potassium 4.2, Sodium 136, eGFR 78.5 12/09/2017 Creatinine 0.9, BUN 7, Potassium 4.4, Sodium 134, eGFR 88.7  A complete set of results can be found in Results Review.  Recommendations:Discussed limiting salt intake and encouraged to call if fluid symptoms develop.  Follow-up plan: ICM clinic phone appointment on3/30/2020.   Copy of ICM check sent to Dr.Taylor.  3 month ICM trend: 11/13/2018    1 Year ICM trend:       Rosalene Billings, RN 11/13/2018 2:28 PM

## 2018-12-18 ENCOUNTER — Ambulatory Visit (INDEPENDENT_AMBULATORY_CARE_PROVIDER_SITE_OTHER): Payer: Medicare Other

## 2018-12-18 ENCOUNTER — Other Ambulatory Visit: Payer: Self-pay

## 2018-12-18 DIAGNOSIS — Z95810 Presence of automatic (implantable) cardiac defibrillator: Secondary | ICD-10-CM

## 2018-12-18 DIAGNOSIS — I5042 Chronic combined systolic (congestive) and diastolic (congestive) heart failure: Secondary | ICD-10-CM | POA: Diagnosis not present

## 2018-12-20 NOTE — Progress Notes (Signed)
EPIC Encounter for ICM Monitoring  Patient Name: [name] [name] is a 73 y.o. male Date: 12/20/2018 Primary Care Physican: Merry Proud, PA-C Primary Cardiologist: Ellyn Hack  Electrophysiologist: Santina Evans Pacing: 94% Last Weight:142lbs Today's Weight:141 lbs   Heart failure questions reviewed.  Denies fluid symptoms and states he is feeling fine.  He has been in the yard working and getting some exercise.   Thoracic impedancenormal.  Prescribed:Furosemide 40 mg Alternate taking 20 mg daily and 40 mg dailyand per 09/04/2018 office note with Dr Lolita Patella weight increases, increase back to 40 mg daily.  Labs: 06/12/2018 Creatinine1.0Lennon Alstrom, Potassium4.2, [id number], LZJQ73.4 12/09/2017 Creatinine0.9, BUN7, Potassium4.4, [id number], [id number] A complete set of results can be found in Results Review.  Recommendations:Encouraged to call if fluid symptoms develop.  Follow-up plan: ICM clinic phone appointment on5/08/2019.   Copy of ICM check sent to Dr.Taylor.  3 month ICM trend: 12/18/2018    1 Year ICM trend:       Rosalene Billings, RN 12/20/2018 4:15 PM

## 2019-01-29 ENCOUNTER — Ambulatory Visit (INDEPENDENT_AMBULATORY_CARE_PROVIDER_SITE_OTHER): Payer: Medicare Other | Admitting: *Deleted

## 2019-01-29 ENCOUNTER — Other Ambulatory Visit: Payer: Self-pay

## 2019-01-29 DIAGNOSIS — I472 Ventricular tachycardia, unspecified: Secondary | ICD-10-CM

## 2019-01-29 DIAGNOSIS — I255 Ischemic cardiomyopathy: Secondary | ICD-10-CM | POA: Diagnosis not present

## 2019-01-30 ENCOUNTER — Ambulatory Visit (INDEPENDENT_AMBULATORY_CARE_PROVIDER_SITE_OTHER): Payer: Medicare Other

## 2019-01-30 ENCOUNTER — Other Ambulatory Visit: Payer: Self-pay

## 2019-01-30 DIAGNOSIS — Z95810 Presence of automatic (implantable) cardiac defibrillator: Secondary | ICD-10-CM | POA: Diagnosis not present

## 2019-01-30 DIAGNOSIS — I5022 Chronic systolic (congestive) heart failure: Secondary | ICD-10-CM

## 2019-01-30 LAB — CUP PACEART REMOTE DEVICE CHECK
Date Time Interrogation Session: 20200512104337
Implantable Lead Implant Date: 20171215
Implantable Lead Implant Date: 20171215
Implantable Lead Implant Date: 20171215
Implantable Lead Location: 753858
Implantable Lead Location: 753859
Implantable Lead Location: 753860
Implantable Lead Model: 7122
Implantable Pulse Generator Implant Date: 20171215
Pulse Gen Serial Number: 7368976

## 2019-02-02 NOTE — Progress Notes (Signed)
EPIC Encounter for ICM Monitoring  Patient Name: [name] [name] is a 73 y.o. male Date: 02/02/2019 Primary Care Physican: Merry Proud, PA-C Primary Cardiologist: Ellyn Hack  Electrophysiologist: Santina Evans Pacing: 94% Last Weight:142lbs 02/02/2019 Weight:141-142 lbs   Heart failure questions reviewed and he is feeling fine.  [place] Thoracic impedancenormal.  Prescribed:Furosemide 40 mgAlternate taking 20 mg daily and 40 mg dailyand per 09/04/2018 office note with Dr Lolita Patella weight increases, increase back to 40 mg daily.  Labs: 06/12/2018 Creatinine1.0Lennon Alstrom, Potassium4.2, [id number], EEFE07.1 12/09/2017 Creatinine0.9, BUN7, Potassium4.4, [id number], [id number] A complete set of results can be found in Results Review.  Recommendations:  Encouraged to call if fluid symptoms develop.  Follow-up plan: ICM clinic phone appointment on6/15/2020.   Copy of ICM check sent to Dr.Taylor.  3 month ICM trend: 01/29/2019    1 Year ICM trend:       [name] Billings, RN 02/02/2019 1:53 PM

## 2019-02-08 NOTE — Progress Notes (Signed)
Remote ICD transmission.   

## 2019-03-05 ENCOUNTER — Ambulatory Visit (INDEPENDENT_AMBULATORY_CARE_PROVIDER_SITE_OTHER): Payer: Medicare Other

## 2019-03-05 DIAGNOSIS — Z95810 Presence of automatic (implantable) cardiac defibrillator: Secondary | ICD-10-CM

## 2019-03-05 DIAGNOSIS — I5022 Chronic systolic (congestive) heart failure: Secondary | ICD-10-CM | POA: Diagnosis not present

## 2019-03-05 DIAGNOSIS — I5042 Chronic combined systolic (congestive) and diastolic (congestive) heart failure: Secondary | ICD-10-CM | POA: Diagnosis not present

## 2019-03-07 ENCOUNTER — Telehealth: Payer: Self-pay | Admitting: Cardiology

## 2019-03-07 NOTE — Telephone Encounter (Signed)
Mychart, no smartphone, verbal consent, pre reg complete 03/07/19 AF °

## 2019-03-07 NOTE — Progress Notes (Signed)
EPIC Encounter for ICM Monitoring  Patient Name: GILLERMO POCH is a 73 y.o. male Date: 03/07/2019 Primary Care Physican: Merry Proud, PA-C Primary Cardiologist: Ellyn Hack  Electrophysiologist: Santina Evans Pacing: 94% 02/02/2019 Weight:141-142lbs   Spoke with patient. Heart failure questions reviewed and he is feeling fine.  [place] Thoracic impedancenormal.  Prescribed:Furosemide 40 mgAlternate taking 20 mg daily and 40 mg dailyand per 09/04/2018 office note with Dr Lolita Patella weight increases, increase back to 40 mg daily.  Labs: 06/12/2018 Creatinine1.0Lennon Alstrom, Potassium4.2, [id number], AYOK59.9 12/09/2017 Creatinine0.9, BUN7, Potassium4.4, [id number], [id number] A complete set of results can be found in Results Review.  Recommendations:  Encouraged to call if fluid symptoms develop.  Follow-up plan: ICM clinic phone appointment on7/20/2020.  Virtual visit with Dr Ellyn Hack 03/13/2019.  Copy of ICM check sent to Dr.Taylor.  3 month ICM trend: 03/05/2019    1 Year ICM trend:       Rosalene Billings, RN 03/07/2019 8:11 AM

## 2019-03-13 ENCOUNTER — Telehealth (INDEPENDENT_AMBULATORY_CARE_PROVIDER_SITE_OTHER): Payer: Medicare Other | Admitting: Cardiology

## 2019-03-13 ENCOUNTER — Encounter: Payer: Self-pay | Admitting: Cardiology

## 2019-03-13 ENCOUNTER — Telehealth: Payer: Self-pay | Admitting: *Deleted

## 2019-03-13 VITALS — BP 132/72 | HR 73 | Ht 66.0 in | Wt 141.0 lb

## 2019-03-13 DIAGNOSIS — E1169 Type 2 diabetes mellitus with other specified complication: Secondary | ICD-10-CM

## 2019-03-13 DIAGNOSIS — F17200 Nicotine dependence, unspecified, uncomplicated: Secondary | ICD-10-CM

## 2019-03-13 DIAGNOSIS — I472 Ventricular tachycardia, unspecified: Secondary | ICD-10-CM

## 2019-03-13 DIAGNOSIS — I5042 Chronic combined systolic (congestive) and diastolic (congestive) heart failure: Secondary | ICD-10-CM

## 2019-03-13 DIAGNOSIS — I2510 Atherosclerotic heart disease of native coronary artery without angina pectoris: Secondary | ICD-10-CM

## 2019-03-13 DIAGNOSIS — I10 Essential (primary) hypertension: Secondary | ICD-10-CM

## 2019-03-13 DIAGNOSIS — I420 Dilated cardiomyopathy: Secondary | ICD-10-CM

## 2019-03-13 DIAGNOSIS — E119 Type 2 diabetes mellitus without complications: Secondary | ICD-10-CM

## 2019-03-13 NOTE — Assessment & Plan Note (Signed)
I suspect with continued reduced EF, that his reduced EF is probably mostly vascular.  Again on stable dose of a ARB and carvedilol.  Had to be switched to valsartan from [place] because of [place] restrictions.  On stable dose of Lasix.  We will hold off on spironolactone given his low level of symptoms.  His status post ICD with OptiVol helping to monitor his volume status.  Would also consider converting from glipizide to [place] for diabetes management.

## 2019-03-13 NOTE — Telephone Encounter (Signed)
spoke to  Patient 's wife - instruction given to her -due patient not hearing well on the phone. avs summary will be sent via mychart . Wife verbalized understanding.

## 2019-03-13 NOTE — Assessment & Plan Note (Signed)
Report of labs from PCP back in January had a LDL of 75.  This is up from the last report I had.  He is still on atorvastatin 40 mg daily, if the levels continue to increase (due to have labs checked soon in July), I would probably convert from atorvastatin to rosuvastatin and/or add Zetia.

## 2019-03-13 NOTE — Assessment & Plan Note (Signed)
Still stable class II symptoms at most.  Seems to be euvolemic per his suggestion.  Is on a stable dose of furosemide which he alternates 20 mg and 40 mg every other day.  According to OptiVol and his symptoms, he is only taking additional Lasix twice in the last year.  Is on stable dose of ARB - valsartan 40 mg (having reduced to 80 mg) along with carvedilol.  Unfortunately was unable to take Entresto due to [place] limitations.

## 2019-03-13 NOTE — Assessment & Plan Note (Signed)
In the early stages, he had an episode of brief ventricular tachycardia initially on amiodarone.  Now on beta-blocker with ICD in place.  ICD seems to be functioning fine with no suggestion of recurrent VT.

## 2019-03-13 NOTE — Patient Instructions (Addendum)
Medication Instructions:   None  If you need a refill on your cardiac medications before your next appointment, please call your pharmacy.   Lab work:  none    Testing/Procedures:  none  Follow-Up: At [facility], you and your health needs are our priority.  As part of our continuing mission to provide you with exceptional heart care, we have created designated Provider Care Teams.  These Care Teams include your primary Cardiologist (physician) and Advanced Practice Providers (APPs -  Physician Assistants and Nurse Practitioners) who all work together to provide you with the care you need, when you need it. . You will need a follow up appointment in 6 months [place]. Please call our office 2 months in advance to schedule this appointment.  You may see Glenetta Hew, MD or one of the following Advanced Practice Providers on your designated Care Team:   . Rosaria Ferries, PA-C . Jory Sims, DNP, ANP  Any Other Special Instructions Will Be Listed Below.

## 2019-03-13 NOTE — Assessment & Plan Note (Signed)
Currently is on glipizide and metformin.  With his reduced ejection fraction and significant CAD, would recommend Jardiance or Farxiga.  Will defer to PCP.  (Question financial ability to obtain with through the [place]

## 2019-03-13 NOTE — Assessment & Plan Note (Signed)
Does not smoke Much.  Only smokes whenever he wants and the daughter says he really does not actually puffed the pipe as much as he just lets it, and burn for the aroma.  No desire to quit.

## 2019-03-13 NOTE — Assessment & Plan Note (Addendum)
Despite having severe multivessel disease, he really did not have classic angina.  He really presented with heart failure symptoms.  Currently not having any resting exertional chest pain.  He is able to be as active as he wants to be without any significant dyspnea.  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  Currently on stable dose of beta-blocker and ARB with recently reduced dose (after being denied starting Entresto by [place]) and improved symptoms.  On stable dose of statin.

## 2019-03-13 NOTE — Progress Notes (Signed)
Virtual Visit via Telephone Note   This visit type was conducted due to national recommendations for restrictions regarding the COVID-19 Pandemic (e.g. social distancing) in an effort to limit this patient's exposure and mitigate transmission in our community.  Due to his co-morbid illnesses, this patient is at least at moderate risk for complications without adequate follow up.  This form is at is felt to be most appropriate for this patient at this time.  The patient did not have access to video technology/had technical difficulties with video requiring transitioning to audio format only (telephone).  All issues noted in this document were discussed and addressed.  No physical exam could be performed with this format.  Please refer to the patient's chart for his  consent to telehealth for [facility].   Patient has given verbal permission to conduct this visit via virtual appointment and to bill insurance 03/13/2019 11:07 PM     Evaluation Performed:  Follow-up visit  Date:  03/13/2019   ID:  [name] [name], [name] [name] [birth year], MRN [id number]  Patient Location: Home Provider Location: Home  PCP:  Merry Proud, PA-C  Cardiologist:  Glenetta Hew, MD  Electrophysiologist:  Cristopher Peru, MD   Chief Complaint: 52-month follow-up  History of Present Illness:    [name] [name] is a 73 y.o. male with PMH notable for CAD-CABG with ICM-ICD, along with status post aortobifem.  Brazen presents via Engineer, civil (consulting) for a telehealth visit today.   February 2014: Known CAD- (s/p 3 V CABG) --> diagnosed after his AoBiFem Bypass in Feb 9381 complicated by ischemic VT post-operatively, NEVER had any symptoms of Angina --> Cath (this was @ [facility] --> relook Cath in July 2014.   Post-op CABG was complicated by Sternal Wound Infxn --> redo-sternotomy with Steel-Plate insertion (on lifelong Dicloxacillin).  After his CABG, he was lost to Cardiology F/u (by his report, the [facility] never  told him to follow-up with Cardiology).  December 2017:  Admitted to [facility] --> His presenting symptoms were more consistent with heart failure exacerbation and possible unstable angina.  ? Echocardiogram revealed EF of roughly 20% & he had sustained VT on monitor (actually the cause for CP --> CATH with no no culprit lesion. ? Status post ICD placement by Dr. Lovena Le December 2017; remains on Amiodarone.  Unable to convert to [facility] b/c not covered by [place] (*despite guideline direction), Valsartan -> Losartan & now back to Valsartan.  JAEKWON MCCLUNE was last seen in December 2019--doing well from a cardiac standpoint no angina symptoms.  Walking about a mile a day for most days of the week.  Noted balance issues for which he uses a cane.  Does note claudication, not limiting.  Also noted some orthostatic dizziness with for standing up. --> Class II CHF symptoms, but no increased diuretic requirement besides maybe once or twice a month. EF 20-25% --> optivol has been good.  (has only had to take extra lasix 2 x in ~ yr)  Interval History:  [name] [name] presents here today for follow-up, and a lot of the history is provided by his daughter because he does not hear well on the telephone call.  She relays as an Astronomer.  Basically she is doing quite fine with no active cardiac symptoms.  He has swelling in the left greater than right leg but this is also associated with having weakness of the legs to the point where he occasionally will have a poor port of balance to  fall.  He has had at least 2 falls that have not been significant.  The most recent fall was walking in the grass and his feet got tangled together.  No loss of consciousness or strokelike symptoms.  He is pretty active on the day a day off basis doing lots of yard work.  Because of the balance issues, he is no longer walking left like he used to.  But he says he probably does more walking during his gardening then he did walking laps.   Until the weather gets hot, he is outside for a good portion of the day.  In doing a level of activity denies any chest pain or pressure with rest or exertion.  He certainly cannot go as fast as he used to, noting some exertional dyspnea, denies any chest pain or pressure.  Certainly the dyspnea is more associated with increased exertion or using stairs or walking up an incline.  Otherwise really stable from a cardiac standpoint.  Cardiovascular ROS: positive for - Stable exertional dyspnea. negative for - chest pain, edema, irregular heartbeat, orthopnea, palpitations, paroxysmal nocturnal dyspnea, rapid heart rate, shortness of breath or Syncope/near syncope, TIA shows amaurosis fugax.  Claudication.  The patient does not have symptoms concerning for COVID-19 infection (fever, chills, cough, or new shortness of breath).  The patient is practicing social distancing.  ROS:  Please see the history of present illness.    Review of Systems  Constitutional: Negative for malaise/fatigue and weight loss.  HENT: Positive for hearing loss. Negative for congestion and nosebleeds.   Respiratory: Positive for cough (Rare). Negative for sputum production and shortness of breath (Stable).   Cardiovascular: Negative for claudication.  Gastrointestinal: Positive for heartburn. Negative for blood in stool, constipation, melena, nausea and vomiting.  Genitourinary: Negative for dysuria, flank pain and hematuria.  Neurological: Positive for dizziness (More related to poor balance and leg fatigue) and weakness (L > R leg). Negative for loss of consciousness.  Endo/Heme/Allergies: Negative for environmental allergies.  Psychiatric/Behavioral: Negative for memory loss (Just a little bit). The patient has insomnia (Has a hard time getting to sleep, does get 6 hours of sleep, usually from 3 in the morning till 9 morning). The patient is not nervous/anxious.     -Claudication, poor balance, dizziness, joint  pain/arthritis.  Memory issues.  Past Medical History:  Diagnosis Date  . Acid reflux   . AICD (automatic cardioverter/defibrillator) present    a. 08/2016 St. Jude (serial Number [id number]) biventricular ICD.  [name] Chronic systolic CHF (congestive heart failure) (HCC)    a. 08/2016 Echo: EF 20%, diffuse HK, inf, post AK, mod-sev MR, sev dil LA, mod TR, PASP .  [name] Complication of anesthesia    "was told I responded well to anesthesia"  . Coronary artery disease involving native coronary artery of native heart with angina pectoris (HCC)    a. 10/2012 MI after aortobifem bypass, found to have multivessel CAD -->CABG x3;  b. 08/2016 Cath: LM 60, LAD 47m, LCX 60ost/p, RCA 100p, VG->RPDA 50p, VG->OM1 ok, LIMA->LAD ok, EF 25%.  . Essential hypertension 03/03/2012  . History of blood transfusion 11/2012   "during his CABG"  . History of gout X 1  . History of stomach ulcers 1970s  . Hyperlipidemia associated with type 2 diabetes mellitus (HCC) 09/16/2011  . Ischemic cardiomyopathy    a. 08/2016 Echo: EF 20%, diffuse HK, inf, post AK;  b. 08/2016 St. Jude (serial Number [id number]) biventricular ICD.  [name] PAD (peripheral artery  disease) (HCC)    s/p aortobifemoral bypass 10/2012  . PTSD (post-traumatic stress disorder)   . Type II diabetes mellitus (HCC)   . Ventricular tachycardia (HCC)    a. 08/2016 St. Jude (serial Number [id number]) biventricular ICD-->oral amio added.  . 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 Is now on chronic suppressive antibiotics with dicloxacillin   Past Surgical History:  Procedure Laterality Date  . [facility]  . BI-VENTRICULAR IMPLANTABLE CARDIOVERTER DEFIBRILLATOR  (CRT-D)  09/03/2016  . CARDIAC CATHETERIZATION  11/2012   [facility] (? for abnormal Nuclear ST) --> no PCI, by report, patent grafts.   [name]. CARDIAC CATHETERIZATION N/A 09/02/2016   Procedure: Left Heart Cath and Cors/Grafts Angiography;  Surgeon: Kathleene Hazelhristopher D McAlhany, MD;  [facility];  Service: Cardiovascular;  Laterality: N/A;  . CARDIAC CATHETERIZATION  10/2012; 03/2013  . CHOLECYSTECTOMY  03/05/2012   Procedure: LAPAROSCOPIC CHOLECYSTECTOMY WITH INTRAOPERATIVE CHOLANGIOGRAM;  Surgeon: Emelia LoronMatthew Wakefield, MD;  [facility];  Service: General;  Laterality: N/A;  . CORONARY ARTERY BYPASS GRAFT  11/2012   CABG X 3;  [facility]  . EP IMPLANTABLE DEVICE N/A 09/03/2016   Procedure: BiV ICD Insertion CRT-D;  Surgeon: Marinus MawGregg W Taylor, MD;  [facility];  Service: Cardiovascular;  Laterality: N/A;  . INGUINAL HERNIA REPAIR Left 1990s  . STERNOTOMY  2015   re-do sternotomy with metal place "sheild" placed. -Has chronic staph infection, on chronic suppressive medication  . TRANSTHORACIC ECHOCARDIOGRAM  02/28/2017   EF remains 20p-25% severely reduced function. Diffuse hypokinesis. "Grade 1 diastolic dysfunction ". Severely calcified aortic valve with restricted mobility (functioning bicuspid) however no suggestion of stenosis.  [name]. UMBILICAL HERNIA REPAIR  03/05/2012   Procedure: HERNIA REPAIR UMBILICAL ADULT;  Surgeon: Emelia LoronMatthew Wakefield, MD;  [facility];  Service: General;  Laterality: N/A;    Cath 08/2016.    Current Meds  Medication Sig  . albuterol (PROVENTIL HFA) 108 (90 Base) MCG/ACT inhaler Inhale 1-2 puffs into the lungs every 6 (six) hours as needed for wheezing or shortness of breath.  . Alpha-Lipoic Acid 600 MG TABS Take 2 tablets by mouth 2 (two) times daily.  [name]. aspirin EC 81 MG tablet Take 81 mg by mouth daily.    [name]. atorvastatin (LIPITOR) 40 MG tablet Take 1 tablet (40 mg total) by mouth daily.  . carvedilol (COREG) 6.25 MG tablet Take 1 tablet (6.25 mg total) by mouth 2 (two) times daily with a meal.  . Cholecalciferol (VITAMIN D-3 PO) Take 2,000 mg by mouth daily.   .  dicloxacillin (DYNAPEN) 250 MG capsule Take 500 mg by mouth 3 (three) times daily.  . fluticasone (FLONASE ALLERGY RELIEF) 50 MCG/ACT nasal spray Place 2 sprays into both nostrils daily as needed for allergies or rhinitis.  . furosemide (LASIX) 40 MG tablet Alternate taking 20 mg daily and 40 mg daily  . glipiZIDE (GLUCOTROL) 5 MG tablet Take 2.5 mg by mouth daily before breakfast.  . magnesium oxide (MAG-OX) 400 (241.3 Mg) MG tablet Take 1 tablet (400 mg total) by mouth daily.  . metFORMIN (GLUCOPHAGE) 1000 MG tablet Take 1 tablet (1,000 mg total) by mouth 2 (two) times daily with a meal. Resume on 09/06/2016  . nitroGLYCERIN (NITROSTAT) 0.4 MG SL tablet Place 1 tablet (0.4 mg total) under the tongue every 5 (five) minutes x 3  doses as needed for chest pain.  [name]. omeprazole (PRILOSEC) 20 MG capsule Take 20 mg by mouth daily.  . Tiotropium Bromide Monohydrate (SPIRIVA HANDIHALER IN) Inhale 18 mcg into the lungs daily.  . valsartan (DIOVAN) 40 MG tablet Take 40 mg by mouth daily.    -- doing well with taking 40 mg Diovan (spiriva formulation has changed)  -- not on Jardiance / Farxiga  Allergies:   Patient has no known allergies.   Social History   Tobacco Use  . Smoking status: Current Every Day Smoker    Types: Pipe  . Smokeless tobacco: Former NeurosurgeonUser    Types: Snuff, Chew  . Tobacco comment: 09/03/2016 "used smokeless tobacco in my teens; quit smoking cigarettes in the early 1980s; smokes pipe only"  Substance Use Topics  . Alcohol use: No  . Drug use: No     Family Hx: The patient's family history includes Cancer in his brother.   Prior CV studies:   The following studies were reviewed today: . Abd Ao - LEA Dupplex 09/2018: Only mild dilation of 2.7 mm noted.  Patent aortobifem graft with moderate disease in the left SFA 30-49%.  Labs/Other Tests and Data Reviewed:    EKG:  No ECG reviewed.  Recent Labs: No results found for requested labs within last 8760 hours.   Recent  Lipid Panel - PCP checked in Jan 6 - due to see in July Lab Results  Component Value Date/Time   CHOL 87 09/02/2016 03:08 AM   TRIG 92 09/02/2016 03:08 AM   HDL 26 (L) 09/02/2016 03:08 AM   CHOLHDL 3.3 09/02/2016 03:08 AM   LDLCALC 43 09/02/2016 03:08 AM  TC 127, LDL 75, HDL 35, TG 136   Wt Readings from Last 3 Encounters:  03/13/19 141 lb (64 kg)  09/27/18 146 lb 9.6 oz (66.5 kg)  09/04/18 147 lb 6.4 oz (66.9 kg)     Objective:    Vital Signs:  BP 132/72   Pulse 73   Ht 5\' 6"  (1.676 m)   Wt 141 lb (64 kg)   BMI 22.76 kg/m   VITAL SIGNS:  reviewed GEN:  no acute distress RESPIRATORY:  Nonlabored respirations. NEURO:  A&O x 3 - hard of hearing PSYCH:  normal affect  ASSESSMENT & PLAN:    Problem List Items Addressed This Visit    Ventricular tachycardia (HCC) (Chronic)    In the early stages, he had an episode of brief ventricular tachycardia initially on amiodarone.  Now on beta-blocker with ICD in place.  ICD seems to be functioning fine with no suggestion of recurrent VT.      Tobacco use disorder (Chronic)    Does not smoke Much.  Only smokes whenever he wants and the daughter says he really does not actually puffed the pipe as much as he just lets it, and burn for the aroma.  No desire to quit.      Hyperlipidemia associated with type 2 diabetes mellitus (HCC) (Chronic)    Report of labs from PCP back in January had a LDL of 75.  This is up from the last report I had.  He is still on atorvastatin 40 mg daily, if the levels continue to increase (due to have labs checked soon in July), I would probably convert from atorvastatin to rosuvastatin and/or add Zetia.      Essential hypertension (Chronic)    Blood pressure looks good.  We have reduced his ARB dose because of orthostasis. Now  currently on carvedilol and ARB at stable doses with no further orthostasis. Really has more balance issues and orthostasis.      Diabetes mellitus type II, non insulin dependent  (HCC) (Chronic)    Currently is on glipizide and metformin.  With his reduced ejection fraction and significant CAD, would recommend Jardiance or Farxiga.  Will defer to PCP.  (Question financial ability to obtain with through the [place]      Coronary artery disease involving native coronary artery of native heart without angina pectoris (Chronic)    Despite having severe multivessel disease, he really did not have classic angina.  He really presented with heart failure symptoms.  Currently not having any resting exertional chest pain.  He is able to be as active as he wants to be without any significant dyspnea.  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  Currently on stable dose of beta-blocker and ARB with recently reduced dose (after being denied starting Entresto by [place]) and improved symptoms.  On stable dose of statin.       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  Again on stable dose of a ARB and carvedilol.  Had to be switched to valsartan from [place] because of [place] restrictions.  On stable dose of Lasix.  We will hold off on spironolactone given his low level of symptoms.  His status post ICD with OptiVol helping to monitor his volume status.  Would also consider converting from glipizide to [place] for diabetes management.      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  Seems to be euvolemic per his suggestion.  Is on a stable dose of furosemide which he alternates 20 mg and 40 mg every other day.  According to OptiVol and his symptoms, he is only taking additional Lasix twice in the last year.  Is on stable dose of  ARB - valsartan 40 mg (having reduced to 80 mg) along with carvedilol.  Unfortunately was unable to take Entresto due to [place] limitations.         COVID-19 Education: The signs and symptoms of COVID-19 were discussed with the patient and how to seek care for testing (follow up with PCP or arrange E-visit).   The importance of social distancing was discussed today.  Time:   Today, I have spent 20 minutes with the patient with telehealth technology discussing the above problems.  Additional 10 min for charting & review   Medication Adjustments/Labs and Tests Ordered: Current medicines are reviewed at length with the patient today.  Concerns regarding medicines are outlined above.  Medication Instructions:   No changes  Tests Ordered: No orders of the defined types were placed in this encounter.  none  Medication Changes: No orders of the defined types were placed in this encounter.   Disposition:  Follow up in 6 month(s)    Signed, Bryan Lemma, MD  03/13/2019 11:07 PM    [facility]

## 2019-03-13 NOTE — Assessment & Plan Note (Signed)
Blood pressure looks good.  We have reduced his ARB dose because of orthostasis. Now currently on carvedilol and ARB at stable doses with no further orthostasis. Really has more balance issues and orthostasis.

## 2019-03-21 ENCOUNTER — Encounter: Payer: Self-pay | Admitting: Pulmonary Disease

## 2019-03-21 ENCOUNTER — Ambulatory Visit (INDEPENDENT_AMBULATORY_CARE_PROVIDER_SITE_OTHER): Payer: Medicare Other | Admitting: Pulmonary Disease

## 2019-03-21 ENCOUNTER — Other Ambulatory Visit: Payer: Self-pay

## 2019-03-21 DIAGNOSIS — J849 Interstitial pulmonary disease, unspecified: Secondary | ICD-10-CM | POA: Diagnosis not present

## 2019-03-21 DIAGNOSIS — R918 Other nonspecific abnormal finding of lung field: Secondary | ICD-10-CM | POA: Diagnosis not present

## 2019-03-21 DIAGNOSIS — J439 Emphysema, unspecified: Secondary | ICD-10-CM

## 2019-03-21 NOTE — Progress Notes (Addendum)
[facility]  Chief Complaint  Patient presents with  . Follow-up    having seasonal allergy issues, denies any other problems    Constitutional:  There were no vitals taken for this visit.  Deferred during telephone visit.  Past Medical History:  VT, DM, PTSD, PAD, ischemic CM, HLD, PUD, Gout, HTN, CAD, a/p AICD, GERD  Brief Summary:  [name] [name] is a 73 y.o. male smoker with emphysema and ILD.  Telephone visit.  Virtual Visit via Telephone Note  I connected with [name] [name] on 03/29/19 at 12:00 PM EDT by telephone and verified that I am speaking with the correct person using two identifiers.  Location: Patient: home Provider: medical office   I discussed the limitations, risks, security and privacy concerns of performing an evaluation and management service by telephone and the availability of in person appointments. I also discussed with the patient that there may be a patient responsible charge related to this service. The patient expressed understanding and agreed to proceed.  Confirmed date of birth and address.  His wife was on phone call with [place].  He has been doing well.  Has some sinus congestion with post nasal drip when working outside.  Used flonase prn.  Uses spiriva daily.  Uses albuterol one or two times per week.  Not having cough, wheeze, chest pain, sputum, fever.  He is keeping up with activities without difficulty.   Physical Exam:   Deferred.  Assessment/Plan:   COPD with emphysema. 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 - monitor clinically off amiodarone  Lung nodules. - f/u CT chest w/o contrast for December 2020   Patient Instructions  Will schedule CT chest without contrast for December 2020 and follow up in office to review results after test is done.  I discussed the assessment and treatment plan with the patient. The patient was provided an  opportunity to ask questions and all were answered. The patient agreed with the plan and demonstrated an understanding of the instructions.   The patient was advised to call back or seek an in-person evaluation if the symptoms worsen or if the condition fails to improve as anticipated.  I provided 17 minutes of non-face-to-face time during this encounter.  Chesley Mires, MD [facility] Pager: [phone] 03/21/2019, 12:15 PM  Flow Sheet     Pulmonary tests:  PFT 09/22/16 >> FEV1 1.62 (58%), FEV1% 58, TLC 6.68 (107%), DLCO 53%, increased RV:TLC PFT 03/20/18 >> FEV1 2.12 (77%), FEV1% 64, TLC 5.81 (93%), DLCO 47% PFT 09/27/18 >> FEV1 2.23 (80%), FEV1% 68, TLC 6.76 (108%), DLCO 51%  Sleep tests:  HRCT chest 04/07/18 >> LLL 7 mm nodule, patchy peribronchovascular and subpleural reticulation and GGO b/l CT chest 08/21/18 >> interstitial and patchy GGO, 5 mm RLL nodule, 7 mm LLL nodule, centrilobular emphysema  Cardiac tests:  Echo 02/28/17 mild LVH, EF 20 to 25%, grade 1 DD, bicuspid AV  Medications:   Allergies as of 03/21/2019   No Known Allergies     Medication List       Accurate as of March 21, 2019 12:15 PM. If you have any questions, ask your nurse or doctor.        Alpha-Lipoic Acid 600 MG Tabs Take 2 tablets by mouth 2 (two) times daily.   aspirin EC 81 MG tablet Take 81 mg by mouth daily.   atorvastatin 40 MG tablet Commonly known as: LIPITOR Take 1  tablet (40 mg total) by mouth daily.   carvedilol 6.25 MG tablet Commonly known as: COREG Take 1 tablet (6.25 mg total) by mouth 2 (two) times daily with a meal.   dicloxacillin 250 MG capsule Commonly known as: DYNAPEN Take 500 mg by mouth 3 (three) times daily.   Flonase Allergy Relief 50 MCG/ACT nasal spray Generic drug: fluticasone Place 2 sprays into both nostrils daily as needed for allergies or rhinitis.   furosemide 40 MG tablet Commonly known as: Lasix Alternate taking 20 mg daily and 40  mg daily   glipiZIDE 5 MG tablet Commonly known as: GLUCOTROL Take 2.5 mg by mouth daily before breakfast.   magnesium oxide 400 (241.3 Mg) MG tablet Commonly known as: MAG-OX Take 1 tablet (400 mg total) by mouth daily.   metFORMIN 1000 MG tablet Commonly known as: GLUCOPHAGE Take 1 tablet (1,000 mg total) by mouth 2 (two) times daily with a meal. Resume on 09/06/2016   nitroGLYCERIN 0.4 MG SL tablet Commonly known as: NITROSTAT Place 1 tablet (0.4 mg total) under the tongue every 5 (five) minutes x 3 doses as needed for chest pain.   omeprazole 20 MG capsule Commonly known as: PRILOSEC Take 20 mg by mouth daily.   Proventil HFA 108 (90 Base) MCG/ACT inhaler Generic drug: albuterol Inhale 1-2 puffs into the lungs every 6 (six) hours as needed for wheezing or shortness of breath.   Spiriva Respimat 2.5 MCG/ACT Aers Generic drug: Tiotropium Bromide Monohydrate Inhale 2 puffs into the lungs daily. What changed: Another medication with the same name was removed. Continue taking this medication, and follow the directions you see here. Changed by: [name], MD   valsartan 40 MG tablet Commonly known as: DIOVAN Take 40 mg by mouth daily.   VITAMIN D-3 PO Take 2,000 mg by mouth daily.       Past Surgical History:  He  has a past surgical history that includes Cholecystectomy (03/05/2012); Umbilical hernia repair (03/05/2012); Aorto-femoral Bypass Graft (Bilateral, 10/2012); Bi-ventricular implantable cardioverter defibrillator  (crt-d) (09/03/2016); Inguinal hernia repair (Left, 1990s); Sternotomy (2015); Coronary artery bypass graft (11/2012); Cardiac catheterization (11/2012); Cardiac catheterization (N/A, 09/02/2016); Cardiac catheterization (10/2012; 03/2013); Cardiac catheterization (N/A, 09/03/2016); and transthoracic echocardiogram (02/28/2017).  Family History:  His family history includes Cancer in his brother.  Social History:  He  reports that he has been smoking  pipe. He has quit using smokeless tobacco.  His smokeless tobacco use included snuff and chew. He reports that he does not drink alcohol or use drugs.

## 2019-03-21 NOTE — Patient Instructions (Signed)
Will schedule CT chest without contrast for December 2020 and follow up in office to review results after test is done.

## 2019-04-09 ENCOUNTER — Ambulatory Visit (INDEPENDENT_AMBULATORY_CARE_PROVIDER_SITE_OTHER): Payer: Medicare Other

## 2019-04-09 DIAGNOSIS — I5022 Chronic systolic (congestive) heart failure: Secondary | ICD-10-CM | POA: Diagnosis not present

## 2019-04-09 DIAGNOSIS — Z95810 Presence of automatic (implantable) cardiac defibrillator: Secondary | ICD-10-CM | POA: Diagnosis not present

## 2019-04-13 NOTE — Progress Notes (Signed)
EPIC Encounter for ICM Monitoring  Patient Name: [name] [name] is a 73 y.o. male Date: 04/13/2019 Primary Care Physican: Merry Proud, PA-C Primary Cardiologist: Ellyn Hack  Electrophysiologist: Santina Evans Pacing: 94% 6/23/2020Weight:141lbs   Transmission reviewed.  CorvueThoracic impedancenormal.  Prescribed:Furosemide 40 mgAlternate taking 20 mg daily and 40 mg daily  Labs: 06/12/2018 Creatinine1.0, BUN9, Potassium4.2, Sodium136, eGFR78.5 12/09/2017 Creatinine0.9, BUN7, Potassium4.4, Sodium134, eGFR88.7 A complete set of results can be found in Results Review.  Recommendations: None  Follow-up plan: ICM clinic phone appointment on8/24/2020.    Copy of ICM check sent to Dr.Taylor.  3 month ICM trend: 04/09/2019    1 Year ICM trend:       Rosalene Billings, RN 04/13/2019 10:57 AM

## 2019-04-16 ENCOUNTER — Ambulatory Visit: Payer: Medicare Other | Admitting: Pulmonary Disease

## 2019-04-30 ENCOUNTER — Ambulatory Visit (INDEPENDENT_AMBULATORY_CARE_PROVIDER_SITE_OTHER): Payer: Medicare Other | Admitting: *Deleted

## 2019-04-30 DIAGNOSIS — I255 Ischemic cardiomyopathy: Secondary | ICD-10-CM

## 2019-04-30 LAB — CUP PACEART REMOTE DEVICE CHECK
Battery Remaining Longevity: 54 mo
Battery Remaining Percentage: 63 %
Battery Voltage: 2.95 V
Brady Statistic AP VP Percent: 1.6 %
Brady Statistic AP VS Percent: 1 %
Brady Statistic AS VP Percent: 93 %
Brady Statistic AS VS Percent: 1.9 %
Brady Statistic RA Percent Paced: 1 %
Date Time Interrogation Session: 20200810070337
HighPow Impedance: 61 Ohm
HighPow Impedance: 61 Ohm
Implantable Lead Implant Date: 20171215
Implantable Lead Implant Date: 20171215
Implantable Lead Implant Date: 20171215
Implantable Lead Location: 753858
Implantable Lead Location: 753859
Implantable Lead Location: 753860
Implantable Lead Model: 7122
Implantable Pulse Generator Implant Date: 20171215
Lead Channel Impedance Value: 350 Ohm
Lead Channel Impedance Value: 410 Ohm
Lead Channel Impedance Value: 490 Ohm
Lead Channel Pacing Threshold Amplitude: 0.5 V
Lead Channel Pacing Threshold Amplitude: 0.75 V
Lead Channel Pacing Threshold Amplitude: 1.25 V
Lead Channel Pacing Threshold Pulse Width: 0.5 ms
Lead Channel Pacing Threshold Pulse Width: 0.5 ms
Lead Channel Pacing Threshold Pulse Width: 0.5 ms
Lead Channel Sensing Intrinsic Amplitude: 2.5 mV
Lead Channel Sensing Intrinsic Amplitude: 7.3 mV
Lead Channel Setting Pacing Amplitude: 2 V
Lead Channel Setting Pacing Amplitude: 2 V
Lead Channel Setting Pacing Amplitude: 2 V
Lead Channel Setting Pacing Pulse Width: 0.5 ms
Lead Channel Setting Pacing Pulse Width: 0.5 ms
Lead Channel Setting Sensing Sensitivity: 0.5 mV
Pulse Gen Serial Number: 7368976

## 2019-05-08 ENCOUNTER — Encounter: Payer: Self-pay | Admitting: Cardiology

## 2019-05-08 NOTE — Progress Notes (Signed)
Remote ICD transmission.   

## 2019-05-14 ENCOUNTER — Ambulatory Visit (INDEPENDENT_AMBULATORY_CARE_PROVIDER_SITE_OTHER): Payer: Medicare Other

## 2019-05-14 DIAGNOSIS — Z95810 Presence of automatic (implantable) cardiac defibrillator: Secondary | ICD-10-CM

## 2019-05-14 DIAGNOSIS — I5022 Chronic systolic (congestive) heart failure: Secondary | ICD-10-CM

## 2019-05-14 NOTE — Progress Notes (Signed)
EPIC Encounter for ICM Monitoring  Patient Name: [name] [name] is a 73 y.o. male Date: 05/14/2019 Primary Care Physican: Merry Proud, PA-C Primary Cardiologist: Ellyn Hack  Electrophysiologist: Santina Evans Pacing: 95% 6/23/2020Weight:141lbs   Attempted call to patient and unable to reach.  Transmission reviewed.   CorvueThoracic impedancenormal.  Prescribed:Furosemide 40 mgAlternate taking 20 mg daily and 40 mg daily  Labs: 06/12/2018 Creatinine1.0, BUN9, Potassium4.2, Sodium136, eGFR78.5 12/09/2017 Creatinine0.9, BUN7, Potassium4.4, Sodium134, eGFR88.7 A complete set of results can be found in Results Review.  Recommendations: Unable to reach.    Follow-up plan: ICM clinic phone appointment on10/01/2019. OV with Dr Lovena Le 07/10/2019.   Copy of ICM check sent to Dr.Taylor.   3 month ICM trend: 05/14/2019    1 Year ICM trend:       Rosalene Billings, RN 05/14/2019 1:21 PM

## 2019-06-25 ENCOUNTER — Ambulatory Visit (INDEPENDENT_AMBULATORY_CARE_PROVIDER_SITE_OTHER): Payer: Medicare Other

## 2019-06-25 DIAGNOSIS — I5022 Chronic systolic (congestive) heart failure: Secondary | ICD-10-CM | POA: Diagnosis not present

## 2019-06-25 DIAGNOSIS — Z95810 Presence of automatic (implantable) cardiac defibrillator: Secondary | ICD-10-CM | POA: Diagnosis not present

## 2019-06-27 NOTE — Progress Notes (Signed)
EPIC Encounter for ICM Monitoring  Patient Name: [name] [name] is a 73 y.o. male Date: 06/27/2019 Primary Care Physican: Merry Proud, PA-C Primary Cardiologist: Ellyn Hack  Electrophysiologist: Santina Evans Pacing: 95% LastWeight:141lbs  Attempted call to patient and unable to reach.  Transmission reviewed.   CorvueThoracic impedancenormal.  Prescribed:Furosemide 40 mgAlternate taking 20 mg daily and 40 mg daily  Labs: 06/12/2018 Creatinine1.0, BUN9, Potassium4.2, Sodium136, eGFR78.5 12/09/2017 Creatinine0.9, BUN7, Potassium4.4, Sodium134, eGFR88.7 A complete set of results can be found in Results Review.  Recommendations: Left voice mail with ICM number and encouraged to call if experiencing any fluid symptoms.  Follow-up plan: ICM clinic phone appointment on 07/31/2019.   91 day device clinic remote transmission 07/30/2019.  Office appt 07/10/2019 with Dr. Lovena Le.    Copy of ICM check sent to Dr. Lovena Le.   3 month ICM trend: 06/25/2019    1 Year ICM trend:       Rosalene Billings, RN 06/27/2019 1:54 PM

## 2019-07-10 ENCOUNTER — Telehealth (INDEPENDENT_AMBULATORY_CARE_PROVIDER_SITE_OTHER): Payer: Medicare Other | Admitting: Internal Medicine

## 2019-07-10 DIAGNOSIS — I5022 Chronic systolic (congestive) heart failure: Secondary | ICD-10-CM

## 2019-07-10 DIAGNOSIS — Z95810 Presence of automatic (implantable) cardiac defibrillator: Secondary | ICD-10-CM | POA: Diagnosis not present

## 2019-07-10 NOTE — Progress Notes (Signed)
Electrophysiology TeleHealth Note   Due to national recommendations of social distancing due to COVID 19, an audio/video telehealth visit is felt to be most appropriate for this patient at this time.  See MyChart message from today for the patient's consent to telehealth for [facility].   Date:  07/10/2019   ID:  [name] [name], DOB [birth year], MRN [id number]  Location: patient's home  Provider location: [address] [place]  Evaluation Performed: Follow-up visit  PCP:  Charolett Bumpers, PA-C  Cardiologist:  Bryan Lemma, MD  Electrophysiologist:  Dr Ladona Ridgel  Chief Complaint:  "I've been doing alright."  History of Present Illness:    [name] [name] is a 73 y.o. male who presents via audio/video conferencing for a telehealth visit today.  Since last being seen in our clinic, the patient reports doing very well.  Today, he denies symptoms of palpitations, chest pain, shortness of breath,  lower extremity edema, dizziness, presyncope, or syncope.  The patient is otherwise without complaint today.  He notes that he stays inside or in his yard. He energy level is down some but he has not been in the hospital.   Past Medical History:  Diagnosis Date  . Acid reflux   . AICD (automatic cardioverter/defibrillator) present    a. 08/2016 St. Jude (serial Number [id number]) biventricular ICD.  [name] Chronic systolic CHF (congestive heart failure) (HCC)    a. 08/2016 Echo: EF 20%, diffuse HK, inf, post AK, mod-sev MR, sev dil LA, mod TR, PASP .  [name] Complication of anesthesia    "was told I responded well to anesthesia"  . Coronary artery disease involving native coronary artery of native heart with angina pectoris (HCC)    a. 10/2012 MI after aortobifem bypass, found to have multivessel CAD -->CABG x3;  b. 08/2016 Cath: LM 60, LAD 72m, LCX 60ost/p, RCA 100p, VG->RPDA 50p, VG->OM1 ok, LIMA->LAD ok, EF 25%.  . Essential hypertension 03/03/2012  . History of blood  transfusion 11/2012   "during his CABG"  . History of gout X 1  . History of stomach ulcers 1970s  . Hyperlipidemia associated with type 2 diabetes mellitus (HCC) 09/16/2011  . Ischemic cardiomyopathy    a. 08/2016 Echo: EF 20%, diffuse HK, inf, post AK;  b. 08/2016 St. Jude (serial Number [id number]) biventricular ICD.  [name] PAD (peripheral artery disease) (HCC)    s/p aortobifemoral bypass 10/2012  . PTSD (post-traumatic stress disorder)   . Type II diabetes mellitus (HCC)   . Ventricular tachycardia (HCC)    a. 08/2016 St. Jude (serial Number [id number]) biventricular ICD-->oral amio added.  . 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 Is now on chronic suppressive antibiotics with dicloxacillin    Past Surgical History:  Procedure Laterality Date  . AORTO-FEMORAL BYPASS GRAFT Bilateral 10/2012   [facility]  . BI-VENTRICULAR IMPLANTABLE CARDIOVERTER DEFIBRILLATOR  (CRT-D)  09/03/2016  . CARDIAC CATHETERIZATION  11/2012   [facility] (? for abnormal Nuclear ST) --> no PCI, by report, patent grafts.  [name] CARDIAC CATHETERIZATION N/A 09/02/2016   Procedure: Left Heart Cath and Cors/Grafts Angiography;  Surgeon: Kathleene Hazel, MD;  Location: [facility] INVASIVE CV LAB;  Service: Cardiovascular;  Laterality: N/A;  . CARDIAC CATHETERIZATION  10/2012; 03/2013  . CHOLECYSTECTOMY  03/05/2012   Procedure: LAPAROSCOPIC CHOLECYSTECTOMY WITH INTRAOPERATIVE CHOLANGIOGRAM;  Surgeon: Emelia Loron, MD;  Location: [facility] OR;  Service: General;  Laterality: N/A;  . CORONARY ARTERY BYPASS GRAFT  11/2012   CABG X 3;  [facility]  . EP IMPLANTABLE DEVICE N/A 09/03/2016   Procedure: BiV ICD Insertion CRT-D;  Surgeon: Marinus Maw, MD;  Location: [facility] INVASIVE CV LAB;  Service: Cardiovascular;  Laterality: N/A;  . INGUINAL HERNIA REPAIR Left 1990s  . STERNOTOMY  2015   re-do sternotomy with metal  place "sheild" placed. -Has chronic staph infection, on chronic suppressive medication  . TRANSTHORACIC ECHOCARDIOGRAM  02/28/2017   EF remains 20p-25% severely reduced function. Diffuse hypokinesis. "Grade 1 diastolic dysfunction ". Severely calcified aortic valve with restricted mobility (functioning bicuspid) however no suggestion of stenosis.  [name] UMBILICAL HERNIA REPAIR  03/05/2012   Procedure: HERNIA REPAIR UMBILICAL ADULT;  Surgeon: Emelia Loron, MD;  Location: [facility] OR;  Service: General;  Laterality: N/A;    Current Outpatient Medications  Medication Sig Dispense Refill  . albuterol (PROVENTIL HFA) 108 (90 Base) MCG/ACT inhaler Inhale 1-2 puffs into the lungs every 6 (six) hours as needed for wheezing or shortness of breath.    . Alpha-Lipoic Acid 600 MG TABS Take 2 tablets by mouth 2 (two) times daily.    [name] aspirin EC 81 MG tablet Take 81 mg by mouth daily.      [name] atorvastatin (LIPITOR) 40 MG tablet Take 1 tablet (40 mg total) by mouth daily. 90 tablet 3  . carvedilol (COREG) 6.25 MG tablet Take 1 tablet (6.25 mg total) by mouth 2 (two) times daily with a meal. 180 tablet 2  . Cholecalciferol (VITAMIN D-3 PO) Take 2,000 mg by mouth daily.     . dicloxacillin (DYNAPEN) 250 MG capsule Take 500 mg by mouth 3 (three) times daily.    . fluticasone (FLONASE ALLERGY RELIEF) 50 MCG/ACT nasal spray Place 2 sprays into both nostrils daily as needed for allergies or rhinitis.    . furosemide (LASIX) 40 MG tablet Alternate taking 20 mg daily and 40 mg daily 30 tablet 6  . glipiZIDE (GLUCOTROL) 5 MG tablet Take 2.5 mg by mouth daily before breakfast.    . magnesium oxide (MAG-OX) 400 (241.3 Mg) MG tablet Take 1 tablet (400 mg total) by mouth daily. 30 tablet 6  . metFORMIN (GLUCOPHAGE) 1000 MG tablet Take 1 tablet (1,000 mg total) by mouth 2 (two) times daily with a meal. Resume on 09/06/2016    . nitroGLYCERIN (NITROSTAT) 0.4 MG SL tablet Place 1 tablet (0.4 mg total) under the tongue every 5  (five) minutes x 3 doses as needed for chest pain. 25 tablet 3  . omeprazole (PRILOSEC) 20 MG capsule Take 20 mg by mouth daily.    . Tiotropium Bromide Monohydrate (SPIRIVA RESPIMAT) 2.5 MCG/ACT AERS Inhale 2 puffs into the lungs daily.    . valsartan (DIOVAN) 40 MG tablet Take 40 mg by mouth daily.     No current facility-administered medications for this visit.     Allergies:   Patient has no known allergies.   Social History:  The patient  reports that he has been smoking pipe. He has quit using smokeless tobacco.  His smokeless tobacco use included snuff and chew. He reports that he does not drink alcohol or use drugs.   Family History:  The patient's  family history includes Cancer in his brother.   ROS:  Please see the history of present illness.   All other systems are personally reviewed and negative.    Exam:    Vital Signs:  There were no vitals taken for this visit.  Well appearing, alert and conversant, regular work of breathing,  good skin color Eyes- anicteric, neuro- grossly intact, skin- no apparent rash or lesions or cyanosis, mouth- oral mucosa is pink   Labs/Other Tests and Data Reviewed:    Recent Labs: No results found for requested labs within last 8760 hours.   Wt Readings from Last 3 Encounters:  03/13/19 141 lb (64 kg)  09/27/18 146 lb 9.6 oz (66.5 kg)  09/04/18 147 lb 6.4 oz (66.9 kg)     Other studies personally reviewed:  Last device remote is reviewed from [place] PDF dated 04/30/19 which reveals normal device function, no arrhythmias    ASSESSMENT & PLAN:    1.  Chronic systolic heart failure - his symptoms are class 2. He will continue his current meds. I asked him to increase his physical activity. 2. ICD - his St. Jude biv icd was last checked 04/30/19 and was working normally. 3. PAF - his amio was stopped but he appears to have maintained NSR.  4. COPD - he had evidence of worsening lung function with incremental reduction in his DLCO  and his amio has been stopped for over a year.   COVID 19 screen The patient denies symptoms of COVID 19 at this time.  The importance of social distancing was discussed today.  Follow-up:  6 months Next remote: 11/20  Current medicines are reviewed at length with the patient today.   The patient does not have concerns regarding his medicines.  The following changes were made today:  none  Labs/ tests ordered today include: none No orders of the defined types were placed in this encounter.    Patient Risk:  after full review of this patients clinical status, I feel that they are at moderate risk at this time.  Today, I have spent 15 minutes with the patient with telehealth technology discussing all of the above .    Signed, Cristopher Peru, MD  07/10/2019 4:13 PM     [address] [place] [address] [phone] (office) [phone] (fax)

## 2019-07-30 ENCOUNTER — Ambulatory Visit (INDEPENDENT_AMBULATORY_CARE_PROVIDER_SITE_OTHER): Payer: Medicare Other | Admitting: *Deleted

## 2019-07-30 DIAGNOSIS — I420 Dilated cardiomyopathy: Secondary | ICD-10-CM

## 2019-07-30 DIAGNOSIS — I472 Ventricular tachycardia, unspecified: Secondary | ICD-10-CM

## 2019-07-31 ENCOUNTER — Ambulatory Visit (INDEPENDENT_AMBULATORY_CARE_PROVIDER_SITE_OTHER): Payer: Medicare Other

## 2019-07-31 ENCOUNTER — Telehealth: Payer: Self-pay

## 2019-07-31 DIAGNOSIS — I5022 Chronic systolic (congestive) heart failure: Secondary | ICD-10-CM | POA: Diagnosis not present

## 2019-07-31 DIAGNOSIS — Z95810 Presence of automatic (implantable) cardiac defibrillator: Secondary | ICD-10-CM | POA: Diagnosis not present

## 2019-07-31 LAB — CUP PACEART REMOTE DEVICE CHECK
Battery Remaining Longevity: 52 mo
Battery Remaining Percentage: 60 %
Battery Voltage: 2.95 V
Brady Statistic AP VP Percent: 1.5 %
Brady Statistic AP VS Percent: 1 %
Brady Statistic AS VP Percent: 93 %
Brady Statistic AS VS Percent: 1.9 %
Brady Statistic RA Percent Paced: 1 %
Date Time Interrogation Session: 20201109073430
HighPow Impedance: 53 Ohm
HighPow Impedance: 53 Ohm
Implantable Lead Implant Date: 20171215
Implantable Lead Implant Date: 20171215
Implantable Lead Implant Date: 20171215
Implantable Lead Location: 753858
Implantable Lead Location: 753859
Implantable Lead Location: 753860
Implantable Lead Model: 7122
Implantable Pulse Generator Implant Date: 20171215
Lead Channel Impedance Value: 350 Ohm
Lead Channel Impedance Value: 410 Ohm
Lead Channel Impedance Value: 480 Ohm
Lead Channel Pacing Threshold Amplitude: 0.5 V
Lead Channel Pacing Threshold Amplitude: 0.75 V
Lead Channel Pacing Threshold Amplitude: 1.25 V
Lead Channel Pacing Threshold Pulse Width: 0.5 ms
Lead Channel Pacing Threshold Pulse Width: 0.5 ms
Lead Channel Pacing Threshold Pulse Width: 0.5 ms
Lead Channel Sensing Intrinsic Amplitude: 2.1 mV
Lead Channel Sensing Intrinsic Amplitude: 9.1 mV
Lead Channel Setting Pacing Amplitude: 2 V
Lead Channel Setting Pacing Amplitude: 2 V
Lead Channel Setting Pacing Amplitude: 2 V
Lead Channel Setting Pacing Pulse Width: 0.5 ms
Lead Channel Setting Pacing Pulse Width: 0.5 ms
Lead Channel Setting Sensing Sensitivity: 0.5 mV
Pulse Gen Serial Number: 7368976

## 2019-07-31 NOTE — Telephone Encounter (Signed)
Remote ICM transmission received.  Attempted call to patient regarding ICM remote transmission and left message to return call   

## 2019-07-31 NOTE — Progress Notes (Signed)
EPIC Encounter for ICM Monitoring  Patient Name: [name] [name] is a 73 y.o. male Date: 07/31/2019 Primary Care Physican: Merry Proud, PA-C Primary Cardiologist: Ellyn Hack  Electrophysiologist: Santina Evans Pacing: 95% LastWeight:141lbs  Attempted call to patient and unable to reach. Transmission reviewed.   CorvueThoracic impedancesuggesting possible ongoing fluid accumulations since 07/28/2019 and trending back toward baseline.   Prescribed:Furosemide 40 mgAlternate taking 20 mg daily and 40 mg daily  Labs: 06/12/2018 Creatinine1.0, BUN9, Potassium4.2, Sodium136, eGFR78.5 12/09/2017 Creatinine0.9, BUN7, Potassium4.4, Sodium134, eGFR88.7 A complete set of results can be found in Results Review.  Recommendations: Unable to reach.    Follow-up plan: ICM clinic phone appointment on 08/08/2019 to recheck fluid levels.   91 day device clinic remote transmission 10/29/2019.  Office appt 09/06/2019 with Dr. Ellyn Hack.    Copy of ICM check sent to Dr. Lovena Le and Dr Ellyn Hack.   3 month ICM trend: 07/30/2019    1 Year ICM trend:       Rosalene Billings, RN 07/31/2019 9:48 AM

## 2019-08-08 ENCOUNTER — Ambulatory Visit (INDEPENDENT_AMBULATORY_CARE_PROVIDER_SITE_OTHER): Payer: Medicare Other

## 2019-08-08 DIAGNOSIS — Z95810 Presence of automatic (implantable) cardiac defibrillator: Secondary | ICD-10-CM

## 2019-08-08 DIAGNOSIS — I5022 Chronic systolic (congestive) heart failure: Secondary | ICD-10-CM

## 2019-08-13 NOTE — Progress Notes (Signed)
EPIC Encounter for ICM Monitoring  Patient Name: [name] [name] is a 73 y.o. male Date: 08/13/2019 Primary Care Physican: Merry Proud, PA-C Primary Cardiologist: Ellyn Hack  Electrophysiologist: Santina Evans Pacing: 94% LastWeight:141lbs  Transmission reviewed.   CorvueThoracic impedancenormal.   Prescribed:Furosemide 40 mgAlternate taking 20 mg daily and 40 mg daily  Labs: 06/12/2018 Creatinine1.0, BUN9, Potassium4.2, Sodium136, eGFR78.5 12/09/2017 Creatinine0.9, BUN7, Potassium4.4, Sodium134, eGFR88.7 A complete set of results can be found in Results Review.  Recommendations: Unable to reach.    Follow-up plan: ICM clinic phone appointment on 09/03/2019.   91 day device clinic remote transmission 10/29/2019.  Office appt 09/06/2019 with Dr. Ellyn Hack.    Copy of ICM check sent to Dr. Lovena Le.  3 month ICM trend: 08/08/2019    1 Year ICM trend:       Rosalene Billings, RN 08/13/2019 9:23 AM

## 2019-08-28 ENCOUNTER — Telehealth: Payer: Self-pay | Admitting: Cardiology

## 2019-08-28 NOTE — Progress Notes (Signed)
Remote ICD transmission.   

## 2019-08-28 NOTE — Telephone Encounter (Signed)
Spoke with patient 's wife .  Patient can have virtual visit ,but will need to change to  First morning or  Last patient of the day. Wife is in agreement. She states patient does want to leave the house due to  covid situation Appointment reschedule to 09/26/19 wed - at 4 pm . Patient is not an early riser. Wife is aware please have blood pressure and heart rate available

## 2019-08-28 NOTE — Telephone Encounter (Signed)
Please ask Dr. Ellyn Hack.

## 2019-08-28 NOTE — Telephone Encounter (Signed)
New Message     Pt and his wife are calling and are wondering if his appt with Dr Ellyn Hack on 12/17 can be a telephone visit    Please call

## 2019-09-03 ENCOUNTER — Ambulatory Visit (INDEPENDENT_AMBULATORY_CARE_PROVIDER_SITE_OTHER): Payer: Medicare Other

## 2019-09-03 DIAGNOSIS — Z95810 Presence of automatic (implantable) cardiac defibrillator: Secondary | ICD-10-CM

## 2019-09-03 DIAGNOSIS — I5022 Chronic systolic (congestive) heart failure: Secondary | ICD-10-CM

## 2019-09-05 NOTE — Progress Notes (Signed)
EPIC Encounter for ICM Monitoring  Patient Name: [name] [name] is a 73 y.o. male Date: 09/05/2019 Primary Care Physican: Merry Proud, PA-C Primary Cardiologist: Ellyn Hack  Electrophysiologist: Santina Evans Pacing: 94% 12/16/2020Weight:141lbs  Spoke with patient and he asymptomatic for fluid accumulation.  CorvueThoracic impedancenormal.   Prescribed:Furosemide 40 mgAlternate taking 20 mg daily and 40 mg daily  Labs: 06/12/2018 Creatinine1.0, BUN9, Potassium4.2, Sodium136, eGFR78.5 12/09/2017 Creatinine0.9, BUN7, Potassium4.4, Sodium134, eGFR88.7 A complete set of results can be found in Results Review.  Recommendations:No changes and encouraged to call if experiencing any fluid symptoms.  Follow-up plan: ICM clinic phone appointment on1/18/2021. 91 day device clinic remote transmission 10/29/2019. Office appt 1/6/2021with Dr. Ellyn Hack.   Copy of ICM check sent to Dr.Taylor.  3 month ICM trend: 09/03/2019    1 Year ICM trend:       Rosalene Billings, RN 09/05/2019 9:05 AM

## 2019-09-06 ENCOUNTER — Ambulatory Visit: Payer: Medicare Other | Admitting: Cardiology

## 2019-09-06 ENCOUNTER — Ambulatory Visit (HOSPITAL_COMMUNITY): Payer: Medicare Other

## 2019-09-26 ENCOUNTER — Encounter: Payer: Self-pay | Admitting: Cardiology

## 2019-09-26 ENCOUNTER — Telehealth: Payer: Self-pay | Admitting: *Deleted

## 2019-09-26 ENCOUNTER — Telehealth (INDEPENDENT_AMBULATORY_CARE_PROVIDER_SITE_OTHER): Payer: Medicare Other | Admitting: Cardiology

## 2019-09-26 VITALS — BP 124/72 | HR 76 | Ht 66.0 in | Wt 143.0 lb

## 2019-09-26 DIAGNOSIS — I472 Ventricular tachycardia, unspecified: Secondary | ICD-10-CM

## 2019-09-26 DIAGNOSIS — I2510 Atherosclerotic heart disease of native coronary artery without angina pectoris: Secondary | ICD-10-CM | POA: Diagnosis not present

## 2019-09-26 DIAGNOSIS — I420 Dilated cardiomyopathy: Secondary | ICD-10-CM | POA: Diagnosis not present

## 2019-09-26 DIAGNOSIS — Z7982 Long term (current) use of aspirin: Secondary | ICD-10-CM

## 2019-09-26 DIAGNOSIS — Z7189 Other specified counseling: Secondary | ICD-10-CM | POA: Diagnosis not present

## 2019-09-26 DIAGNOSIS — E1169 Type 2 diabetes mellitus with other specified complication: Secondary | ICD-10-CM | POA: Diagnosis not present

## 2019-09-26 DIAGNOSIS — Z951 Presence of aortocoronary bypass graft: Secondary | ICD-10-CM

## 2019-09-26 DIAGNOSIS — I5042 Chronic combined systolic (congestive) and diastolic (congestive) heart failure: Secondary | ICD-10-CM | POA: Diagnosis not present

## 2019-09-26 DIAGNOSIS — I110 Hypertensive heart disease with heart failure: Secondary | ICD-10-CM | POA: Diagnosis not present

## 2019-09-26 DIAGNOSIS — I10 Essential (primary) hypertension: Secondary | ICD-10-CM

## 2019-09-26 DIAGNOSIS — Z95810 Presence of automatic (implantable) cardiac defibrillator: Secondary | ICD-10-CM | POA: Diagnosis not present

## 2019-09-26 DIAGNOSIS — E785 Hyperlipidemia, unspecified: Secondary | ICD-10-CM | POA: Diagnosis not present

## 2019-09-26 NOTE — Patient Instructions (Signed)
Medication Instructions:  No changes  *If you need a refill on your cardiac medications before your next appointment, please call your pharmacy*  Lab Work:  When we finally do have you come in for an in person visit, I would like to have it be scheduled as a morning visit so he can come in for fasting blood work on the morning of your visit: Fasting lipid panel, CMP, CBC and A1c.  If you have labs (blood work) drawn today and your tests are completely normal, you will receive your results only by: [name] MyChart Message (if you have MyChart) OR . A paper copy in the mail If you have any lab test that is abnormal or we need to change your treatment, we will call you to review the results.  Testing/Procedures: None  Follow-Up: At [facility], you and your health needs are our priority.  As part of our continuing mission to provide you with exceptional heart care, we have created designated Provider Care Teams.  These Care Teams include your primary Cardiologist (physician) and Advanced Practice Providers (APPs -  Physician Assistants and Nurse Practitioners) who all work together to provide you with the care you need, when you need it.  Your next appointment:   5 month(s)  The format for your next appointment:   In Person -should wait until he has had both Covid vaccine shots.  Provider:   Bryan Lemma, MD  Other Instructions Keep staying active and try to get some exercise outside -go out when it is warmer in the day.

## 2019-09-26 NOTE — Telephone Encounter (Signed)
RN spoke to patient's wife. Instruction were given  from today's virtual visit 09/26/19 .  AVS SUMMARY has been sent by mychart  And mailed.   Patient verbalized understanding

## 2019-09-26 NOTE — Progress Notes (Signed)
Virtual Visit via Telephone Note   This visit type was conducted due to national recommendations for restrictions regarding the COVID-19 Pandemic (e.g. social distancing) in an effort to limit this patient's exposure and mitigate transmission in our community.  Due to his co-morbid illnesses, this patient is at least at moderate risk for complications without adequate follow up.  This format is felt to be most appropriate for this patient at this time.  The patient did not have access to video technology/had technical difficulties with video requiring transitioning to audio format only (telephone).  All issues noted in this document were discussed and addressed.  No physical exam could be performed with this format.  Please refer to the patient's chart for his  consent to telehealth for [facility].   Patient has given verbal permission to conduct this visit via virtual appointment and to bill insurance 09/27/2019 4:07 PM     Evaluation Performed:  Follow-up visit  Date:  09/27/2019   ID:  [name] [name], DOB 1946-05-21, MRN [id number]  Patient Location: Home Provider Location: Office  PCP:  Charolett Bumpers, PA-C  Cardiologist:  Bryan Lemma, MD Electrophysiologist:  Lewayne Bunting, MD   Chief Complaint:   Chief Complaint  Patient presents with  . Follow-up    6 months  . Coronary Artery Disease    No angina  . 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     History of Present Illness:    [name] [name] is a 74 y.o. male with PMH notable for CAD & ICM/Combined CHF who presents via audio/video conferencing for a telehealth visit today.   February 2014:Known CAD- (s/p 3 V CABG) -->diagnosed after his AoBiFem Bypass in Feb 2014 complicated by ischemic VT post-operatively, NEVER had any symptoms of Angina -->Cath (this was @ [facility] -->relook Cath in July 2014.   Post-op CABG was  complicated by Sternal Wound Infxn -->redo-sternotomy with Steel-Plate insertion (on lifelong Dicloxacillin).  After his CABG, he was lost to Cardiology F/u (by [facility], [facility]).  December 2017:Admitted to [facility] -->His presenting symptoms were more consistent with heart failure exacerbation and possible unstable angina.  ? Echocardiogram revealed EF of roughly 20% & he had sustained VT on monitor (actually the cause for CP --> CATH with no no culprit lesion. ? Status post ICD placement by Dr. Ladona Ridgel December 2017; remains on Amiodarone.  Unable to convert to [facility] b/c not covered by [place] (*despite guideline direction), Valsartan -> Losartan & now back to Valsartan.  [name] [name] was last seen in [place] 2020 via Telemedicine -   Telemedicine with Dr.Taylor in Oct 2020 - doing well. Stays inside or in yard.  Energy level is down. NYHA Class II Sx. ICD normal Fxn.  Maintaining NSR off Amiodarone.   Hospitalizations:  . none   Recent - Interim CV studies:   The following studies were reviewed today: . none:  Inerval History   [name] [name] is doing fairly well overall.  He has been relatively hunkered down at home, and he and his wife barely do any outside venturing since the [employer].  Most of their necessities are being provided are purchased by their daughter.  Only on occasion with his wife go out to the local market to get milk or other necessities.  He himself has not left his house in quite a  long time.  He does try to do some walking around the house, but now the weather is gotten colder and more damp, he is not going outside as much.  He is a little bit deconditioned, but is able to do what he wants to do without any issues.  He has the same baseline shortness of breath and will get short of breath going uphill, but is not really changed for some time now.  He has some poor balance in the morning but that usually resolves  by simply sitting on the side of his bed for little bit.  He has some mild end of day swelling in the left ankle greater than the right but otherwise no PND orthopnea.  He is not having any issues with getting medications refilled.  Cardiovascular ROS: no chest pain or dyspnea on exertion positive for - mild end of day edema L>R ankle -ususally controlled with lasix negative for - irregular heartbeat, orthopnea, palpitations, paroxysmal nocturnal dyspnea, rapid heart rate or baseline SOB/DOE.  no syncope/near syncope; TIA / amaurosis fugax   ROS:  Please see the history of present illness.    The patient does not have symptoms concerning for COVID-19 infection (fever, chills, cough, or new shortness of breath). Staying home.  Dtr does shopping. Review of Systems  Constitutional: Negative for chills, fever, malaise/fatigue (just deconditioned - not doing much) and weight loss.  HENT: Negative for congestion and nosebleeds.   Respiratory: Positive for cough (off & on) and shortness of breath (baseline). Negative for sputum production.        Baseline COPD symptoms  Cardiovascular: Negative for leg swelling (stable mild L-R ankle).  Gastrointestinal: Negative for abdominal pain, blood in stool, heartburn and melena.  Genitourinary: Negative for hematuria.  Musculoskeletal: Positive for joint pain (still). Negative for falls.       He had a weird episode a few months back where his left elbow was burning a little bit and then all of a sudden he felt a jolt of discomfort from his elbow up to her shoulder and his arm twitched.  This is happened a couple times in the last 6 months but not routine.  Neurological: Positive for dizziness (just in AM). Negative for focal weakness and headaches.       Poor balance - worse in AM  Endo/Heme/Allergies: Negative for environmental allergies.  Psychiatric/Behavioral: Negative for memory loss. The patient is not nervous/anxious and does not have insomnia (8-10  hr per night - but goes to bed ~ 3 Am & sleeps till late AM).   All other systems reviewed and are negative.  The patient is practicing social distancing.  Past Medical History:  Diagnosis Date  . Acid reflux   . AICD (automatic cardioverter/defibrillator) present    a. 08/2016 St. Jude (serial Number [id number]) biventricular ICD.  [name] Chronic systolic CHF (congestive heart failure) (HCC)    a. 08/2016 Echo: EF 20%, diffuse HK, inf, post AK, mod-sev MR, sev dil LA, mod TR, PASP .  [name] Complication of anesthesia    "was told I responded well to anesthesia"  . Coronary artery disease involving native coronary artery of native heart with angina pectoris (HCC)    a. 10/2012 MI after aortobifem bypass, found to have multivessel CAD -->CABG x3;  b. 08/2016 Cath: LM 60, LAD 57m, LCX 60ost/p, RCA 100p, VG->RPDA 50p, VG->OM1 ok, LIMA->LAD ok, EF 25%.  . Essential hypertension 03/03/2012  . History of blood transfusion 11/2012   "during his  CABG"  . History of gout X 1  . History of stomach ulcers 1970s  . Hyperlipidemia associated with type 2 diabetes mellitus (HCC) 09/16/2011  . Ischemic cardiomyopathy    a. 08/2016 Echo: EF 20%, diffuse HK, inf, post AK;  b. 08/2016 St. Jude (serial Number [id number]) biventricular ICD.  [name] PAD (peripheral artery disease) (HCC)    s/p aortobifemoral bypass 10/2012  . PTSD (post-traumatic stress disorder)   . Type II diabetes mellitus (HCC)   . Ventricular tachycardia (HCC)    a. 08/2016 St. Jude (serial Number [id number]) biventricular ICD-->oral amio added.  . 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 Is now on chronic suppressive antibiotics with dicloxacillin   Past Surgical History:  Procedure Laterality Date  . AORTO-FEMORAL BYPASS GRAFT Bilateral 10/2012   [facility]  . BI-VENTRICULAR IMPLANTABLE CARDIOVERTER DEFIBRILLATOR  (CRT-D)   09/03/2016  . CARDIAC CATHETERIZATION  11/2012   [facility] (? for abnormal Nuclear ST) --> no PCI, by report, patent grafts.  [name] CARDIAC CATHETERIZATION N/A 09/02/2016   Procedure: Left Heart Cath and Cors/Grafts Angiography;  Surgeon: Kathleene Hazel, MD;  Location[facility] INVASIVE CV LAB;  Service: Cardiovascular;  Laterality: N/A;  . CARDIAC CATHETERIZATION  10/2012; 03/2013  . CHOLECYSTECTOMY  03/05/2012   Procedure: LAPAROSCOPIC CHOLECYSTECTOMY WITH INTRAOPERATIVE CHOLANGIOGRAM;  Surgeon: Emelia Loron, MD;  Location: [facility] OR;  Service: General;  Laterality: N/A;  . CORONARY ARTERY BYPASS GRAFT  11/2012   CABG X 3;  [facility]  . EP IMPLANTABLE DEVICE N/A 09/03/2016   Procedure: BiV ICD Insertion CRT-D;  Surgeon: Marinus Maw, MD;  Location: [facility] INVASIVE CV LAB;  Service: Cardiovascular;  Laterality: N/A;  . INGUINAL HERNIA REPAIR Left 1990s  . STERNOTOMY  2015   re-do sternotomy with metal place "sheild" placed. -Has chronic staph infection, on chronic suppressive medication  . TRANSTHORACIC ECHOCARDIOGRAM  02/28/2017   EF remains 20p-25% severely reduced function. Diffuse hypokinesis. "Grade 1 diastolic dysfunction ". Severely calcified aortic valve with restricted mobility (functioning bicuspid) however no suggestion of stenosis.  [name] UMBILICAL HERNIA REPAIR  03/05/2012   Procedure: HERNIA REPAIR UMBILICAL ADULT;  Surgeon: Emelia Loron, MD;  Location: [facility] OR;  Service: General;  Laterality: N/A;     Current Meds  Medication Sig  . albuterol (PROVENTIL HFA) 108 (90 Base) MCG/ACT inhaler Inhale 1-2 puffs into the lungs every 6 (six) hours as needed for wheezing or shortness of breath.  . Alpha-Lipoic Acid 600 MG TABS Take 2 tablets by mouth 2 (two) times daily.  [name] aspirin EC 81 MG tablet Take 81 mg by mouth daily.    [name] atorvastatin (LIPITOR) 40 MG tablet Take 1 tablet (40 mg total) by mouth daily.  . carvedilol (COREG) 6.25 MG tablet Take 1 tablet (6.25 mg total) by mouth 2 (two)  times daily with a meal.  . Cholecalciferol (VITAMIN D-3 PO) Take 2,000 mg by mouth daily.   . dicloxacillin (DYNAPEN) 250 MG capsule Take 500 mg by mouth 3 (three) times daily.  . fluticasone (FLONASE ALLERGY RELIEF) 50 MCG/ACT nasal spray Place 2 sprays into both nostrils daily as needed for allergies or rhinitis.  . furosemide (LASIX) 40 MG tablet Alternate taking 20 mg daily and 40 mg daily  . glipiZIDE (GLUCOTROL) 5 MG tablet Take 2.5 mg by mouth daily before breakfast.  . magnesium oxide (MAG-OX) 400 (241.3 Mg) MG tablet Take 1 tablet (  400 mg total) by mouth daily.  . metFORMIN (GLUCOPHAGE) 1000 MG tablet Take 1 tablet (1,000 mg total) by mouth 2 (two) times daily with a meal. Resume on 09/06/2016  . nitroGLYCERIN (NITROSTAT) 0.4 MG SL tablet Place 1 tablet (0.4 mg total) under the tongue every 5 (five) minutes x 3 doses as needed for chest pain.  [name] omeprazole (PRILOSEC) 20 MG capsule Take 20 mg by mouth daily.  . Tiotropium Bromide Monohydrate (SPIRIVA RESPIMAT) 2.5 MCG/ACT AERS Inhale 2 puffs into the lungs daily.  . valsartan (DIOVAN) 40 MG tablet Take 40 mg by mouth daily.     Allergies:   Patient has no known allergies.   Social History   Tobacco Use  . Smoking status: Current Every Day Smoker    Types: Pipe  . Smokeless tobacco: Former Neurosurgeon    Types: Snuff, Chew  . Tobacco comment: 09/03/2016 "used smokeless tobacco in my teens; quit smoking cigarettes in the early 1980s; smokes pipe only"  Substance Use Topics  . Alcohol use: No  . Drug use: No     Family Hx: The patient's family history includes Cancer in his brother.   Labs/Other Tests and Data Reviewed:    EKG:  No ECG reviewed.  Recent Labs: No results found for requested labs within last 8760 hours.   Recent Lipid Panel Lab Results  Component Value Date/Time   CHOL 87 09/02/2016 03:08 AM   TRIG 92 09/02/2016 03:08 AM   HDL 26 (L) 09/02/2016 03:08 AM   CHOLHDL 3.3 09/02/2016 03:08 AM   LDLCALC 43  09/02/2016 03:08 AM    Wt Readings from Last 3 Encounters:  09/26/19 143 lb (64.9 kg)  03/13/19 141 lb (64 kg)  09/27/18 146 lb 9.6 oz (66.5 kg)     Objective:    Vital Signs:  BP 124/72   Pulse 76   Ht 5\' 6"  (1.676 m)   Wt 143 lb (64.9 kg)   SpO2 97%   BMI 23.08 kg/m   VITAL SIGNS:  reviewed GEN:  no acute distress RESPIRATORY:  non-labored.  NEURO:  alert and oriented x 3, no obvious focal deficit  Normal Mood & Affect  ASSESSMENT & PLAN:    Problem List Items Addressed This Visit    Coronary artery disease involving native coronary artery of native heart without angina pectoris (Chronic)   Ventricular tachycardia (HCC) (Chronic)   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, but Also Potentially Arrhythmogenic (Chronic)   Hyperlipidemia associated with type 2 diabetes mellitus (HCC) (Chronic)   Essential hypertension (Chronic)   NYHA class 2 and ACC/AHA stage C chronic combined systolic and diastolic congestive heart failure (HCC) - Primary   S/P CABG x 3     [name] [name] doing very well overall from a cardiac standpoint.  No active angina symptoms.  NYHA-2 CHF symptoms.  Is on stable regimen with carvedilol and Diovan along with standing dose of Lasix.  We talked about sliding scale based on weight gain and additional swelling.  He is not having any active anginal pain.  Is on aspirin and statin along with the beta-blocker and ARB for CAD.  Last ischemic evaluation was in 2017.  With no active symptoms, I think we can hold off until we are well through the COVID-19 restriction timeframe.  Blood pressure is well controlled.  Lipids have been well controlled in the past.  Should be due for having labs checked.  We will have him get blood work done  the morning he comes for his next follow-up visit which should be done as an inpatient this summer.  We talked about Covid vaccine.  I do think that he would be a great candidate for getting it done as soon as  possible.  Unfortunately he is not over 77 so he relates to have to wait for the next iteration.  He is can I contact his PCPs office and/or the [facility] to find out the most expeditious way for this to be done.  Once he gets his second shot, he should then feel comfortable venturing out more.  Plan: Continue current medications with no changes.  Check lipid panel, chemistry, A1c and CBC in the morning of his next in person follow-up this summer.   COVID-19 Education: The signs and symptoms of COVID-19 were discussed with the patient and how to seek care for testing (follow up with PCP or arrange E-visit).   The importance of social distancing was discussed today.  Time:   Today, I have spent 20 minutes with the patient with telehealth technology discussing the above problems.     Medication Adjustments/Labs and Tests Ordered: Current medicines are reviewed at length with the patient today.  Concerns regarding medicines are outlined above.   Patient Instructions  Medication Instructions:  No changes  *If you need a refill on your cardiac medications before your next appointment, please call your pharmacy*  Lab Work:  When we finally do have you come in for an in person visit, I would like to have it be scheduled as a morning visit so he can come in for fasting blood work on the morning of your visit: Fasting lipid panel, CMP, CBC and A1c.  If you have labs (blood work) drawn today and your tests are completely normal, you will receive your results only by: [name] MyChart Message (if you have MyChart) OR . A paper copy in the mail If you have any lab test that is abnormal or we need to change your treatment, we will call you to review the results.  Testing/Procedures: None  Follow-Up: At [facility], you and your health needs are our priority.  As part of our continuing mission to provide you with exceptional heart care, we have created designated Provider Care Teams.  These Care Teams  include your primary Cardiologist (physician) and Advanced Practice Providers (APPs -  Physician Assistants and Nurse Practitioners) who all work together to provide you with the care you need, when you need it.  Your next appointment:   5 month(s)  The format for your next appointment:   In Person -should wait until he has had both Covid vaccine shots.  Provider:   Glenetta Hew, MD  Other Instructions Keep staying active and try to get some exercise outside -go out when it is warmer in the day.     Signed, Glenetta Hew, MD  09/27/2019 4:07 PM    [name] [name]

## 2019-09-27 ENCOUNTER — Encounter: Payer: Self-pay | Admitting: Cardiology

## 2019-10-08 ENCOUNTER — Ambulatory Visit (INDEPENDENT_AMBULATORY_CARE_PROVIDER_SITE_OTHER): Payer: Medicare Other

## 2019-10-08 DIAGNOSIS — Z95810 Presence of automatic (implantable) cardiac defibrillator: Secondary | ICD-10-CM | POA: Diagnosis not present

## 2019-10-08 DIAGNOSIS — I5022 Chronic systolic (congestive) heart failure: Secondary | ICD-10-CM

## 2019-10-12 ENCOUNTER — Telehealth: Payer: Self-pay

## 2019-10-12 NOTE — Progress Notes (Signed)
EPIC Encounter for ICM Monitoring  Patient Name: [name] [name] is a 74 y.o. male Date: 10/12/2019 Primary Care Physican: Kroner, Kevaughn K, PA-C Primary Cardiologist: Harding  Electrophysiologist: Taylor Bi-V Pacing: 94% 12/16/2020Weight:141lbs  Attempted call to patient and unable to reach.   Transmission reviewed.   CorvueThoracic impedancenormal.   Prescribed:Furosemide 40 mgAlternate taking 20 mg daily and 40 mg daily  Labs: 06/12/2018 Creatinine1.0, BUN9, Potassium4.2, Sodium136, eGFR78.5 12/09/2017 Creatinine0.9, BUN7, Potassium4.4, Sodium134, eGFR88.7 A complete set of results can be found in Results Review.  Recommendations: Unable to reach.    Follow-up plan: ICM clinic phone appointment on2/22/2021. 91 day device clinic remote transmission 10/29/2019.    Copy of ICM check sent to Dr. Taylor.   3 month ICM trend: 10/08/2019    1 Year ICM trend:       Laurie S Short, RN 10/12/2019 10:29 AM   

## 2019-10-12 NOTE — Telephone Encounter (Signed)
Remote ICM transmission received.  Attempted call to patient regarding ICM remote transmission and no answer.  

## 2019-10-19 IMAGING — CT CT CHEST HIGH RESOLUTION W/O CM
2 of 5 series · 14 of 36 positions shown, 17 images · non-contrast
Comparison: 09/04/2016 chest radiograph.

CLINICAL DATA: Emphysema.  Amiodarone therapy.

EXAM:
CT CHEST WITHOUT CONTRAST
TECHNIQUE: Multidetector CT imaging of the chest was performed following the
standard protocol without intravenous contrast. High resolution
imaging of the lungs, as well as inspiratory and expiratory imaging,
was performed.

[Series 2: high resolution · axial · 0.67mm/px · z∈[-321,-21]mm · 11 of 166 slices shown, 14 images]
[im 8/166  mediastinal]
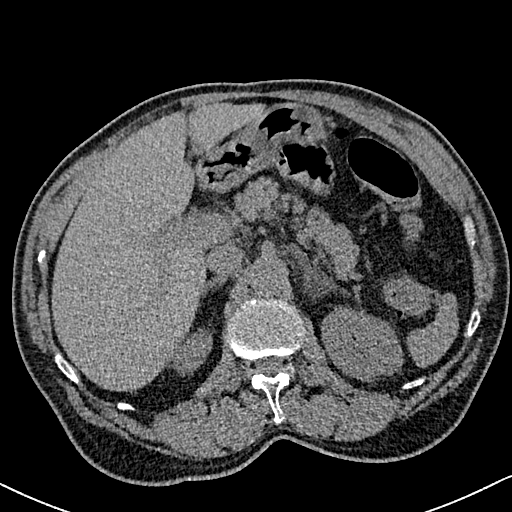
[im 8/166  lung]
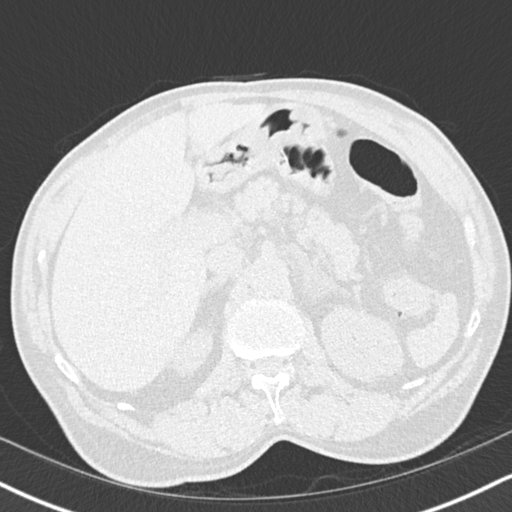
[im 23/166  lung]
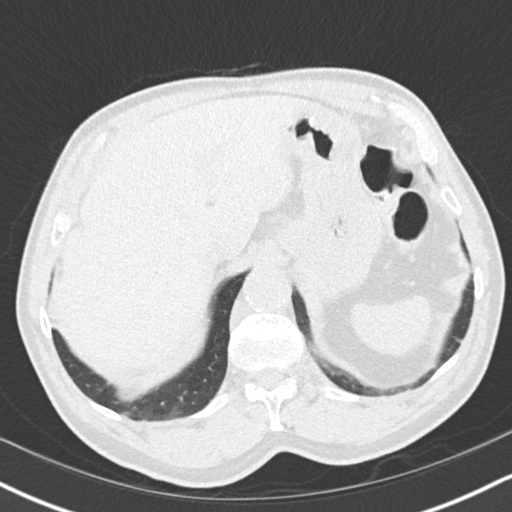
[im 38/166  lung]
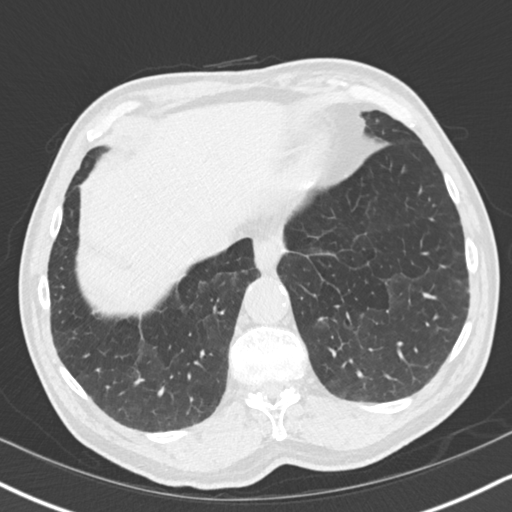
[im 53/166  lung]
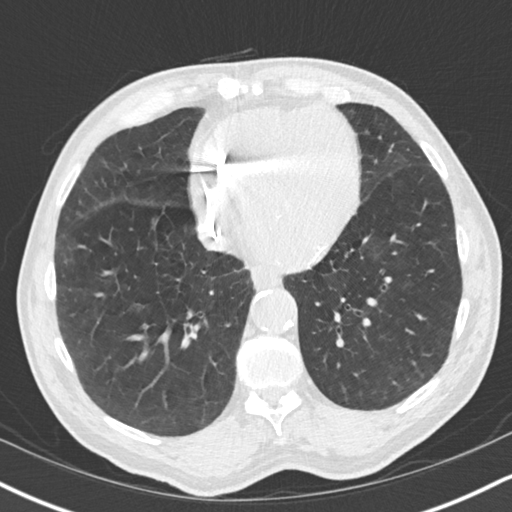
[im 68/166  mediastinal]
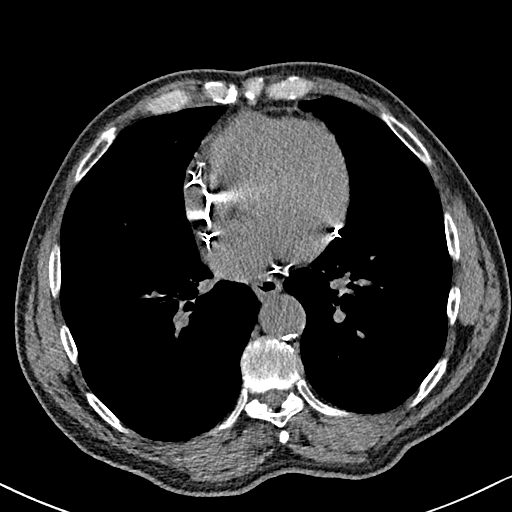
[im 68/166  lung]
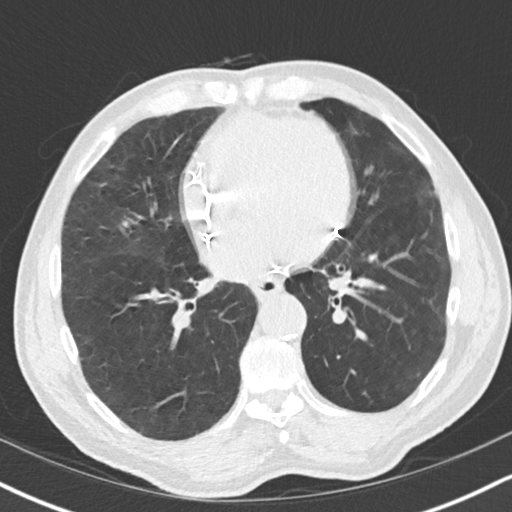
[im 83/166  lung]
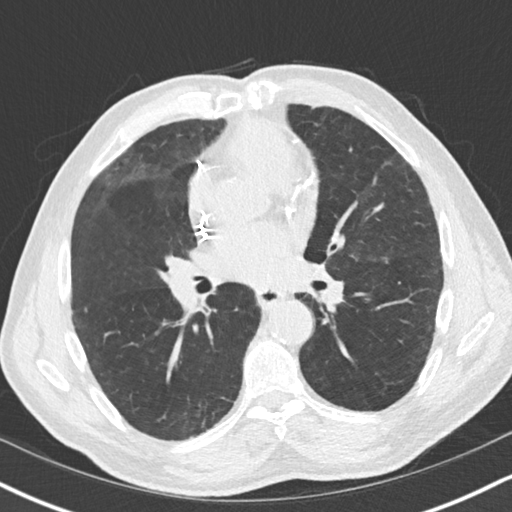
[im 98/166  lung]
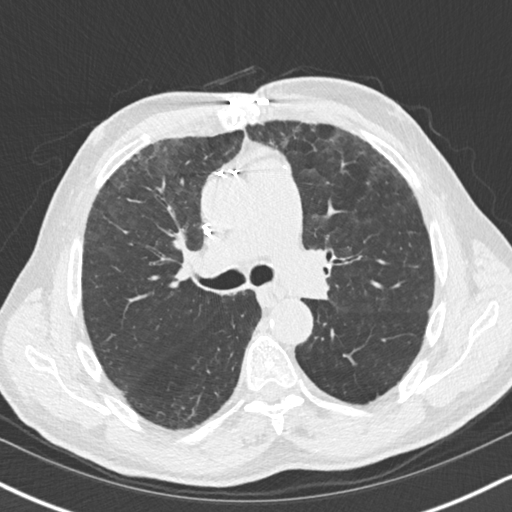
[im 113/166  lung]
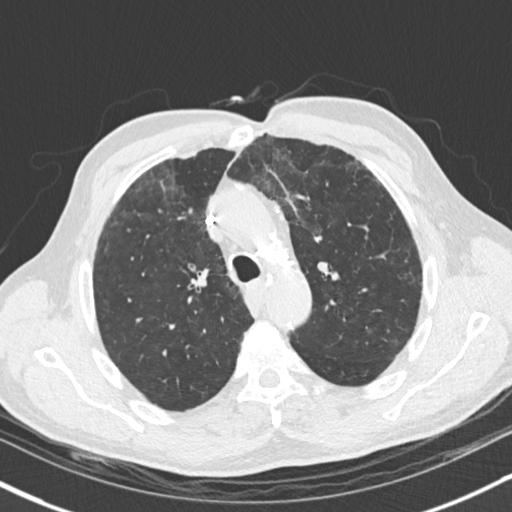
[im 128/166  mediastinal]
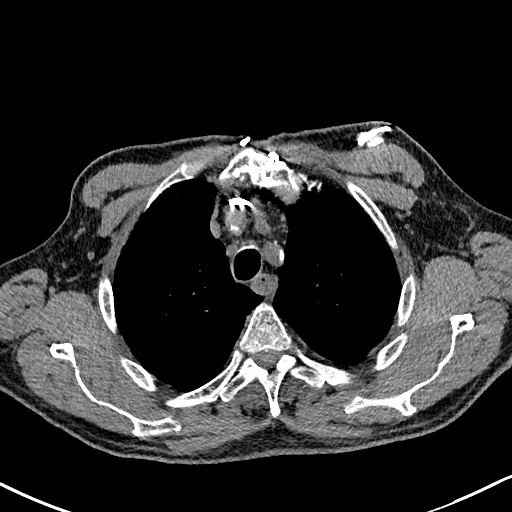
[im 128/166  lung]
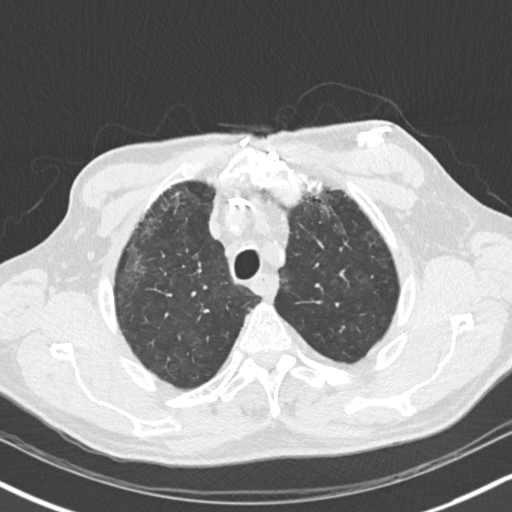
[im 143/166  lung]
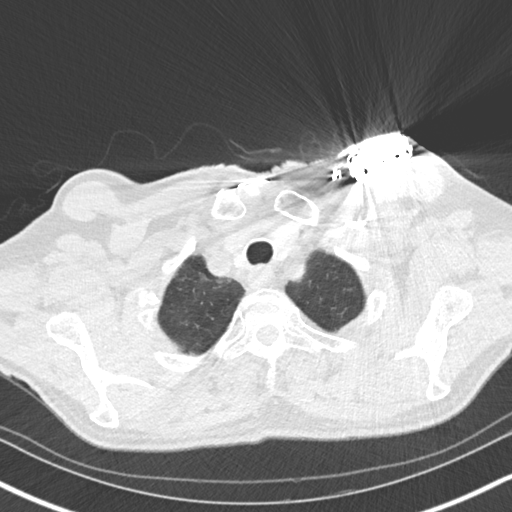
[im 158/166  lung]
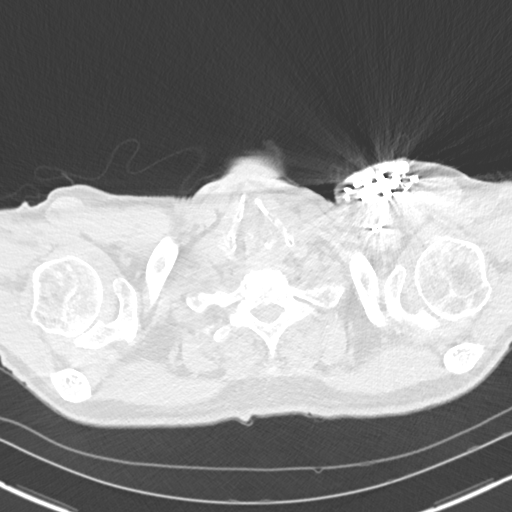

[Series 8: coronal · coronal · 0.64mm/px · 3 of 139 slices shown]
[im 28/139  lung]
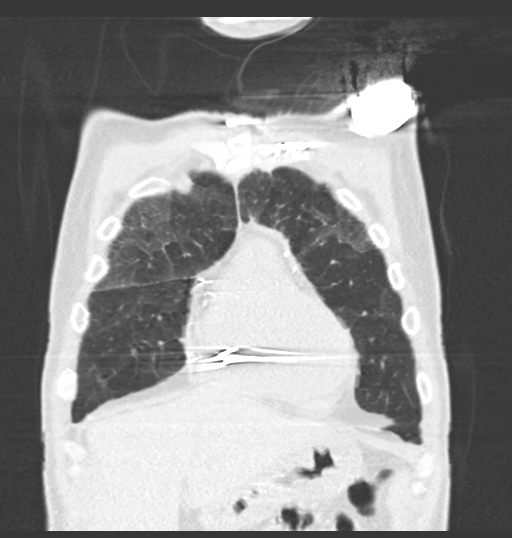
[im 56/139  lung]
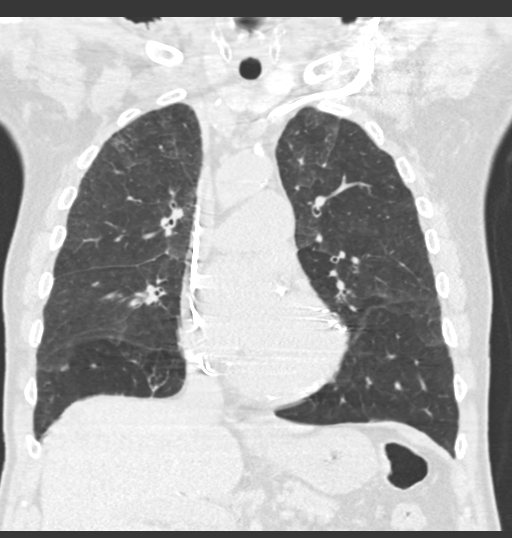
[im 83/139  lung]
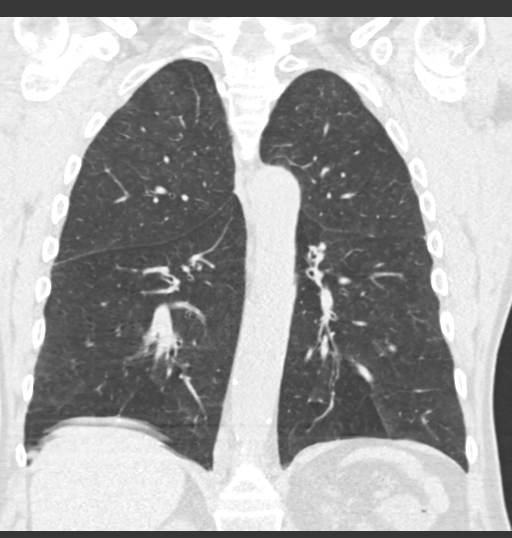

[14 of 36 positions shown; findings below may reference images not displayed]

FINDINGS: Motion degraded scan, limiting assessment.

Cardiovascular: Normal heart size. No significant pericardial
effusion/thickening. Left main and 3 vessel coronary atherosclerosis
status post CABG. 3 lead left subclavian ICD is noted with lead tips
in the right atrium, right ventricular apex and coronary sinus.
Atherosclerotic nonaneurysmal thoracic aorta. Dilated main pulmonary
artery (3.5 cm diameter).

Mediastinum/Nodes: No discrete thyroid nodules. Unremarkable
esophagus. No pathologically enlarged axillary, mediastinal or hilar
lymph nodes, noting limited sensitivity for the detection of hilar
adenopathy on this noncontrast study. Nonenlarged coarsely calcified
granulomatous subcarinal nodes.

Lungs/Pleura: No pneumothorax. No pleural effusion. No acute
consolidative airspace disease or lung masses. Subcentimeter
calcified anterior left upper lobe granuloma. Three scattered solid
pulmonary nodules in the lower lobes, largest 7 mm in the left lower
lobe (series 3/image 105). There is patchy peribronchovascular and
subpleural reticulation and ground-glass opacity throughout both
lungs. No significant regions of traction bronchiectasis,
architectural distortion, parenchymal banding or frank honeycombing.
No clear apicobasilar gradient to these findings. No significant air
trapping on the expiration sequence.

Upper abdomen: Left adrenal 2.3 cm adenoma with density -2 HU.

Musculoskeletal: No aggressive appearing focal osseous lesions.
There is loosening of two interlocking pins associated with the
lower left sternal plate (series 9/images 92 and 93). Stable
fracture of the lower most sternotomy wires. Mild thoracic
spondylosis.
IMPRESSION: 1. Patchy peribronchovascular and subpleural reticulation and
ground-glass opacity throughout both lungs. No significant traction
bronchiectasis or frank honeycombing. Findings may indicate an
interstitial lung disease such as nonspecific interstitial pneumonia
(NSIP), which could be on the basis of amiodarone pulmonary
toxicity, although the findings are not specific. Follow-up
high-resolution chest CT may be obtained in 12 months to assess
temporal pattern stability, as clinically warranted.
2. Scattered solid pulmonary nodules, largest 7 mm. Non-contrast
chest CT at 3-6 months is recommended. If the nodules are stable at
time of repeat CT, then future CT at 18-24 months (from today's
scan) is considered optional for low-risk patients, but is
recommended for high-risk patients. This recommendation follows the
consensus statement: Guidelines for Management of Incidental
Pulmonary Nodules Detected on CT Images: From the [HOSPITAL]
3. Apparent loosening of two interlocking pins associated with the
lower left sternal plate.
4. Left adrenal adenoma.
5. Dilated main pulmonary artery, suggesting pulmonary arterial
hypertension.

Aortic Atherosclerosis ([id number].A).

## 2019-10-29 ENCOUNTER — Ambulatory Visit (INDEPENDENT_AMBULATORY_CARE_PROVIDER_SITE_OTHER): Payer: Medicare Other | Admitting: *Deleted

## 2019-10-29 DIAGNOSIS — I472 Ventricular tachycardia, unspecified: Secondary | ICD-10-CM

## 2019-10-29 LAB — CUP PACEART REMOTE DEVICE CHECK
Battery Remaining Longevity: 49 mo
Battery Remaining Percentage: 57 %
Battery Voltage: 2.95 V
Brady Statistic AP VP Percent: 1.5 %
Brady Statistic AP VS Percent: 1 %
Brady Statistic AS VP Percent: 92 %
Brady Statistic AS VS Percent: 2 %
Brady Statistic RA Percent Paced: 1 %
Date Time Interrogation Session: 20210208020016
HighPow Impedance: 53 Ohm
HighPow Impedance: 53 Ohm
Implantable Lead Implant Date: 20171215
Implantable Lead Implant Date: 20171215
Implantable Lead Implant Date: 20171215
Implantable Lead Location: 753858
Implantable Lead Location: 753859
Implantable Lead Location: 753860
Implantable Lead Model: 7122
Implantable Pulse Generator Implant Date: 20171215
Lead Channel Impedance Value: 330 Ohm
Lead Channel Impedance Value: 400 Ohm
Lead Channel Impedance Value: 480 Ohm
Lead Channel Pacing Threshold Amplitude: 0.5 V
Lead Channel Pacing Threshold Amplitude: 0.75 V
Lead Channel Pacing Threshold Amplitude: 1.25 V
Lead Channel Pacing Threshold Pulse Width: 0.5 ms
Lead Channel Pacing Threshold Pulse Width: 0.5 ms
Lead Channel Pacing Threshold Pulse Width: 0.5 ms
Lead Channel Sensing Intrinsic Amplitude: 2.3 mV
Lead Channel Sensing Intrinsic Amplitude: 6.7 mV
Lead Channel Setting Pacing Amplitude: 2 V
Lead Channel Setting Pacing Amplitude: 2 V
Lead Channel Setting Pacing Amplitude: 2 V
Lead Channel Setting Pacing Pulse Width: 0.5 ms
Lead Channel Setting Pacing Pulse Width: 0.5 ms
Lead Channel Setting Sensing Sensitivity: 0.5 mV
Pulse Gen Serial Number: 7368976

## 2019-10-30 NOTE — Progress Notes (Signed)
ICD Remote  

## 2019-11-12 ENCOUNTER — Telehealth: Payer: Self-pay

## 2019-11-12 ENCOUNTER — Ambulatory Visit (INDEPENDENT_AMBULATORY_CARE_PROVIDER_SITE_OTHER): Payer: Medicare Other

## 2019-11-12 DIAGNOSIS — Z95810 Presence of automatic (implantable) cardiac defibrillator: Secondary | ICD-10-CM

## 2019-11-12 DIAGNOSIS — I5022 Chronic systolic (congestive) heart failure: Secondary | ICD-10-CM

## 2019-11-12 NOTE — Progress Notes (Signed)
EPIC Encounter for ICM Monitoring  Patient Name: [name] [name] is a 74 y.o. male Date: 11/12/2019 Primary Care Physican: Merry Proud, PA-C Primary Cardiologist: Ellyn Hack  Electrophysiologist: Santina Evans Pacing: 93% 09/26/2019 Office Weight:143lbs  Attempted call to patient and unable to reach.   Transmission reviewed.   CorvueThoracic impedancedecreased suggesting possible fluid accumulation since 11/08/19 but trending back to baseline.   Prescribed:Furosemide 40 mgAlternate taking 20 mg daily and 40 mg daily  Labs: 06/12/2018 Creatinine1.0, BUN9, Potassium4.2, Sodium136, eGFR78.5 12/09/2017 Creatinine0.9, BUN7, Potassium4.4, Sodium134, eGFR88.7 A complete set of results can be found in Results Review.  Recommendations: Unable to reach.    Follow-up plan: ICM clinic phone appointment on3/10/2019 to recheck fluid levels. 91 day device clinic remote transmission 01/28/2020.    Copy of ICM check sent to Dr. Lovena Le.   3 month ICM trend: 11/12/2019    1 Year ICM trend:       Rosalene Billings, RN 11/12/2019 4:54 PM

## 2019-11-12 NOTE — Telephone Encounter (Signed)
Remote ICM transmission received.  Attempted call to patient regarding ICM remote transmission and left detailed message per DPR to return call.   

## 2019-11-20 ENCOUNTER — Ambulatory Visit (INDEPENDENT_AMBULATORY_CARE_PROVIDER_SITE_OTHER): Payer: Medicare Other

## 2019-11-20 DIAGNOSIS — Z95810 Presence of automatic (implantable) cardiac defibrillator: Secondary | ICD-10-CM

## 2019-11-20 DIAGNOSIS — I5022 Chronic systolic (congestive) heart failure: Secondary | ICD-10-CM

## 2019-11-20 NOTE — Progress Notes (Signed)
EPIC Encounter for ICM Monitoring  Patient Name: [name] [name] is a 74 y.o. male Date: 11/20/2019 Primary Care Physican: Merry Proud, PA-C Primary Cardiologist: Ellyn Hack  Electrophysiologist: Santina Evans Pacing: 93% 11/20/2019 Weight:142lbs  Spoke with patient and reports feeling well at this time.  Denies fluid symptoms.    CorvueThoracic impedancereturned to baseline normal since 11/12/19 transmission.   Prescribed:Furosemide 40 mgAlternate taking 20 mg daily and 40 mg daily  Labs: 06/12/2018 Creatinine1.0, BUN9, Potassium4.2, Sodium136, eGFR78.5 12/09/2017 Creatinine0.9, BUN7, Potassium4.4, Sodium134, eGFR88.7 A complete set of results can be found in Results Review.  Recommendations:No changes and encouraged to call if experiencing any fluid symptoms.  Follow-up plan: ICM clinic phone appointment on3/29/2021. 91 day device clinic remote transmission 01/28/2020.  Copy of ICM check sent to Dr.Taylor.    3 month ICM trend: 11/20/2019    1 Year ICM trend:       Rosalene Billings, RN 11/20/2019 10:17 AM

## 2019-12-17 ENCOUNTER — Ambulatory Visit (INDEPENDENT_AMBULATORY_CARE_PROVIDER_SITE_OTHER): Payer: Medicare Other

## 2019-12-17 DIAGNOSIS — I5022 Chronic systolic (congestive) heart failure: Secondary | ICD-10-CM

## 2019-12-17 DIAGNOSIS — Z95810 Presence of automatic (implantable) cardiac defibrillator: Secondary | ICD-10-CM | POA: Diagnosis not present

## 2019-12-19 ENCOUNTER — Telehealth: Payer: Self-pay | Admitting: Internal Medicine

## 2019-12-19 NOTE — Telephone Encounter (Signed)
[name] the patient's wife is calling requesting she or her daughter [name] attend Ovid's upcoming appt scheduled for 01/15/20 due to Urho having trouble hearing. Please advise.

## 2019-12-20 NOTE — Telephone Encounter (Signed)
Left detailed message advising ok for 1 assist.

## 2019-12-21 NOTE — Progress Notes (Signed)
EPIC Encounter for ICM Monitoring  Patient Name: [name] [name] is a 74 y.o. male Date: 12/21/2019 Primary Care Physican: Merry Proud, PA-C Primary Cardiologist: Ellyn Hack  Electrophysiologist: Santina Evans Pacing: 93% 11/20/2019 Weight:142lbs  Transmission reviewed   CorvueThoracic impedancenormal.   Prescribed:Furosemide 40 mgAlternate taking 20 mg daily and 40 mg daily  Labs: 06/12/2018 Creatinine1.0, BUN9, Potassium4.2, Sodium136, eGFR78.5 12/09/2017 Creatinine0.9, BUN7, Potassium4.4, Sodium134, eGFR88.7 A complete set of results can be found in Results Review.  Recommendations:None.  Follow-up plan: ICM clinic phone appointment on5/12/2019. 91 day device clinic remote transmission5/06/2020.Office visit with Dr Lovena Le 12/29/2019.  Copy of ICM check sent to Dr.Taylor.   3 month ICM trend: 12/18/2019    1 Year ICM trend:       Rosalene Billings, RN 12/21/2019 2:32 PM

## 2019-12-31 ENCOUNTER — Telehealth: Payer: Self-pay | Admitting: *Deleted

## 2019-12-31 NOTE — Telephone Encounter (Signed)
LMOVM requesting call back to DC. Direct number and office hours provided.  ICD alert received for 2 VT-1 episodes on 12/28/19 treated with 1-2 bursts ATP. Both EGMs show late break after ATP delivery. See example episode below. 12 NSVT episodes also noted. Will assess symptoms and medication compliance. Pt is scheduled with Dr. Ladona Ridgel on 01/15/20 at 3:30pm.

## 2020-01-02 NOTE — Telephone Encounter (Signed)
LMOVM at home and cell numbers requesting call back to DC. Direct number and office hours provided.

## 2020-01-02 NOTE — Telephone Encounter (Signed)
Pt wife was returning [place] phone call. I let her talk with the device nurse Amy, rn.

## 2020-01-02 NOTE — Telephone Encounter (Signed)
Pt spouse returned phone call, pt in background answering questions.  Advised of episode Friday afternoon 4/9.  She states pt may have been out working in yard as he has been doing that lately.  Pt does not recall any specific symptoms.  Spouse confirms that pt has been compliant with medications including Carvedilol 6.25mg  BID and Mag Ox 400mg  daily.  Pt has appt scheduled with Dr. on 4/27.   Advised spouse of appropriately treated episode, Educated on shock plan and DMV driving restrictions to not operate a motor vehicle for 6 months following.

## 2020-01-04 NOTE — Telephone Encounter (Signed)
follow up as scheduled.

## 2020-01-15 ENCOUNTER — Ambulatory Visit (INDEPENDENT_AMBULATORY_CARE_PROVIDER_SITE_OTHER): Payer: Medicare Other | Admitting: Internal Medicine

## 2020-01-15 ENCOUNTER — Encounter: Payer: Self-pay | Admitting: Internal Medicine

## 2020-01-15 ENCOUNTER — Other Ambulatory Visit: Payer: Self-pay

## 2020-01-15 VITALS — BP 126/78 | HR 81 | Ht 66.0 in | Wt 146.6 lb

## 2020-01-15 DIAGNOSIS — I480 Paroxysmal atrial fibrillation: Secondary | ICD-10-CM | POA: Diagnosis not present

## 2020-01-15 DIAGNOSIS — I472 Ventricular tachycardia, unspecified: Secondary | ICD-10-CM

## 2020-01-15 DIAGNOSIS — I5022 Chronic systolic (congestive) heart failure: Secondary | ICD-10-CM | POA: Diagnosis not present

## 2020-01-15 DIAGNOSIS — Z95810 Presence of automatic (implantable) cardiac defibrillator: Secondary | ICD-10-CM | POA: Diagnosis not present

## 2020-01-15 DIAGNOSIS — I2510 Atherosclerotic heart disease of native coronary artery without angina pectoris: Secondary | ICD-10-CM

## 2020-01-15 NOTE — Patient Instructions (Signed)
Medication Instructions:  Your physician recommends that you continue on your current medications as directed. Please refer to the Current Medication list given to you today.  Labwork: None ordered.  Testing/Procedures: None ordered.  Follow-Up: Your physician wants you to follow-up in: one year with Dr. Ladona Ridgel.   You will receive a reminder letter in the mail two months in advance. If you don't receive a letter, please call our office to schedule the follow-up appointment.  Remote monitoring is used to monitor your ICD from home. This monitoring reduces the number of office visits required to check your device to one time per year. It allows [place] to keep an eye on the functioning of your device to ensure it is working properly. You are scheduled for a device check from home on 01/28/2020. You may send your transmission at any time that day. If you have a wireless device, the transmission will be sent automatically. After your physician reviews your transmission, you will receive a postcard with your next transmission date.  Any Other Special Instructions Will Be Listed Below (If Applicable).  If you need a refill on your cardiac medications before your next appointment, please call your pharmacy.

## 2020-01-15 NOTE — Progress Notes (Signed)
HPI [name] returns today for followup. He has a h/o PAF, chronic systolic heart failure, s/p ICD insertion, PAD, and COPD. He remains active raising a garden. He had developed worsening dyspnea and PFT's and his amiodarone was stopped. He has done well in the interim with no symptomatic atrial fib. He gets tired if he works too long at his garden.  No Known Allergies   Current Outpatient Medications  Medication Sig Dispense Refill  . albuterol (PROVENTIL HFA) 108 (90 Base) MCG/ACT inhaler Inhale 1-2 puffs into the lungs every 6 (six) hours as needed for wheezing or shortness of breath.    . Alpha-Lipoic Acid 600 MG TABS Take 2 tablets by mouth 2 (two) times daily.    [name] aspirin EC 81 MG tablet Take 81 mg by mouth daily.      [name] atorvastatin (LIPITOR) 40 MG tablet Take 1 tablet (40 mg total) by mouth daily. 90 tablet 3  . carvedilol (COREG) 6.25 MG tablet Take 1 tablet (6.25 mg total) by mouth 2 (two) times daily with a meal. 180 tablet 2  . Cholecalciferol (VITAMIN D-3 PO) Take 2,000 mg by mouth daily.     . dicloxacillin (DYNAPEN) 250 MG capsule Take 500 mg by mouth 3 (three) times daily.    . fluticasone (FLONASE ALLERGY RELIEF) 50 MCG/ACT nasal spray Place 2 sprays into both nostrils daily as needed for allergies or rhinitis.    . furosemide (LASIX) 40 MG tablet Alternate taking 20 mg daily and 40 mg daily 30 tablet 6  . glipiZIDE (GLUCOTROL) 5 MG tablet Take 2.5 mg by mouth daily before breakfast.    . magnesium oxide (MAG-OX) 400 (241.3 Mg) MG tablet Take 1 tablet (400 mg total) by mouth daily. 30 tablet 6  . metFORMIN (GLUCOPHAGE) 1000 MG tablet Take 1 tablet (1,000 mg total) by mouth 2 (two) times daily with a meal. Resume on 09/06/2016    . nitroGLYCERIN (NITROSTAT) 0.4 MG SL tablet Place 1 tablet (0.4 mg total) under the tongue every 5 (five) minutes x 3 doses as needed for chest pain. 25 tablet 3  . omeprazole (PRILOSEC) 20 MG capsule Take 20 mg by mouth daily.    .  Tiotropium Bromide Monohydrate (SPIRIVA RESPIMAT) 2.5 MCG/ACT AERS Inhale 2 puffs into the lungs daily.    . valsartan (DIOVAN) 40 MG tablet Take 40 mg by mouth daily.     No current facility-administered medications for this visit.     Past Medical History:  Diagnosis Date  . Acid reflux   . AICD (automatic cardioverter/defibrillator) present    a. 08/2016 St. Jude (serial Number [id number]) biventricular ICD.  [name] Chronic systolic CHF (congestive heart failure) (HCC)    a. 08/2016 Echo: EF 20%, diffuse HK, inf, post AK, mod-sev MR, sev dil LA, mod TR, PASP .  [name] Complication of anesthesia    "was told I responded well to anesthesia"  . Coronary artery disease involving native coronary artery of native heart with angina pectoris (HCC)    a. 10/2012 MI after aortobifem bypass, found to have multivessel CAD -->CABG x3;  b. 08/2016 Cath: LM 60, LAD 7m, LCX 60ost/p, RCA 100p, VG->RPDA 50p, VG->OM1 ok, LIMA->LAD ok, EF 25%.  . Essential hypertension 03/03/2012  . History of blood transfusion 11/2012   "during his CABG"  . History of gout X 1  . History of stomach ulcers 1970s  . Hyperlipidemia associated with type 2 diabetes mellitus (HCC) 09/16/2011  . Ischemic  cardiomyopathy    a. 08/2016 Echo: EF 20%, diffuse HK, inf, post AK;  b. 08/2016 St. Jude (serial Number [id number]) biventricular ICD.  [name] PAD (peripheral artery disease) ([place])    s/p aortobifemoral bypass 10/2012  . PTSD (post-traumatic stress disorder)   . Type II diabetes mellitus ([place])   . Ventricular tachycardia ([place])    a. 08/2016 St. Jude (serial Number [id number]) biventricular ICD-->oral amio added.  . 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 Is now on chronic suppressive antibiotics with dicloxacillin    ROS:   All systems reviewed and negative except as noted in the HPI.   Past Surgical  History:  Procedure Laterality Date  . AORTO-FEMORAL BYPASS GRAFT Bilateral 10/2012   [facility]  . BI-VENTRICULAR IMPLANTABLE CARDIOVERTER DEFIBRILLATOR  (CRT-D)  09/03/2016  . CARDIAC CATHETERIZATION  11/2012   [facility] (? for abnormal Nuclear ST) --> no PCI, by report, patent grafts.  [name] CARDIAC CATHETERIZATION N/A 09/02/2016   Procedure: Left Heart Cath and Cors/Grafts Angiography;  Surgeon: Burnell Blanks, MD;  Location: [facility];  Service: Cardiovascular;  Laterality: N/A;  . CARDIAC CATHETERIZATION  10/2012; 03/2013  . CHOLECYSTECTOMY  03/05/2012   Procedure: LAPAROSCOPIC CHOLECYSTECTOMY WITH INTRAOPERATIVE CHOLANGIOGRAM;  Surgeon: Rolm Bookbinder, MD;  Location: [facility];  Service: General;  Laterality: N/A;  . CORONARY ARTERY BYPASS GRAFT  11/2012   CABG X 3;  [facility]  . EP IMPLANTABLE DEVICE N/A 09/03/2016   Procedure: BiV ICD Insertion CRT-D;  Surgeon: Evans Lance, MD;  Location: [facility];  Service: Cardiovascular;  Laterality: N/A;  . INGUINAL HERNIA REPAIR Left 1990s  . STERNOTOMY  2015   re-do sternotomy with metal place "sheild" placed. -Has chronic staph infection, on chronic suppressive medication  . TRANSTHORACIC ECHOCARDIOGRAM  02/28/2017   EF remains 20p-25% severely reduced function. Diffuse hypokinesis. "Grade 1 diastolic dysfunction ". Severely calcified aortic valve with restricted mobility (functioning bicuspid) however no suggestion of stenosis.  [name] UMBILICAL HERNIA REPAIR  03/05/2012   Procedure: HERNIA REPAIR UMBILICAL ADULT;  Surgeon: Rolm Bookbinder, MD;  Location: [facility] OR;  Service: General;  Laterality: N/A;     Family History  Problem Relation Age of Onset  . Cancer Brother        esophageal     Social History   Socioeconomic History  . Marital status: Married    Spouse name: Not on file  . Number of children: Not on file  . Years of education: Not on file  . Highest education level: Not on file  Occupational History   . Not on file  Tobacco Use  . Smoking status: Current Every Day Smoker    Types: Pipe  . Smokeless tobacco: Former Systems developer    Types: Snuff, Chew  . Tobacco comment: 09/03/2016 "used smokeless tobacco in my teens; quit smoking cigarettes in the early 1980s; smokes pipe only"  Substance and Sexual Activity  . Alcohol use: No  . Drug use: No  . Sexual activity: Not on file  Other Topics Concern  . Not on file  Social History Narrative  . Not on file   Social Determinants of Health   Financial Resource Strain:   . Difficulty of Paying Living Expenses:   Food Insecurity:   . Worried About Charity fundraiser in the Last Year:   . [place] in the Last  Year:   Transportation Needs:   . Freight forwarder (Medical):   [name] Lack of Transportation (Non-Medical):   Physical Activity:   . Days of Exercise per Week:   . Minutes of Exercise per Session:   Stress:   . Feeling of Stress :   Social Connections:   . Frequency of Communication with Friends and Family:   . Frequency of Social Gatherings with Friends and Family:   . Attends Religious Services:   . Active Member of Clubs or Organizations:   . Attends Banker Meetings:   [name] Marital Status:   Intimate Partner Violence:   . Fear of Current or Ex-Partner:   . Emotionally Abused:   [name] Physically Abused:   . Sexually Abused:      BP 126/78   Pulse 81   Ht 5\' 6"  (1.676 m)   Wt 146 lb 9.6 oz (66.5 kg)   SpO2 99%   BMI 23.66 kg/m   Physical Exam:  Well appearing NAD HEENT: Unremarkable Neck:  No JVD, no thyromegally Lymphatics:  No adenopathy Back:  No CVA tenderness Lungs:  Clear with no wheezes HEART:  Regular rate rhythm, no murmurs, no rubs, no clicks Abd:  soft, positive bowel sounds, no organomegally, no rebound, no guarding Ext:  2 plus pulses, no edema, no cyanosis, no clubbing Skin:  No rashes no nodules Neuro:  CN II through XII intact, motor grossly intact  EKG - NSR with biv pacing   DEVICE  Normal device function.  See PaceArt for details.   Assess/Plan: 1. Chronic systolic heart failure - his symptoms are class 2. He will continue his current meds. 2. PAF - he is maintaining NSR despite stopping his amiodarone 3. ICD - his St. Jude Biv ICD is working normally. He has about 4 years of battery longevity. 4. CAD  - he denies anginal symptoms. He is encouraged to remain active. We will follow.  .D.

## 2020-01-22 ENCOUNTER — Ambulatory Visit (INDEPENDENT_AMBULATORY_CARE_PROVIDER_SITE_OTHER): Payer: Medicare Other

## 2020-01-22 DIAGNOSIS — I5022 Chronic systolic (congestive) heart failure: Secondary | ICD-10-CM | POA: Diagnosis not present

## 2020-01-22 DIAGNOSIS — Z95810 Presence of automatic (implantable) cardiac defibrillator: Secondary | ICD-10-CM

## 2020-01-23 ENCOUNTER — Telehealth: Payer: Self-pay | Admitting: Cardiology

## 2020-01-23 NOTE — Progress Notes (Signed)
EPIC Encounter for ICM Monitoring  Patient Name: [name] [name] is a 74 y.o. male Date: 01/23/2020 Primary Care Physican: Merry Proud, PA-C Primary Cardiologist: Ellyn Hack  Electrophysiologist: Santina Evans Pacing: 89% 01/15/2020 Office Weight:146lbs  AT/AF Burden: 0% (Per Dr Tanna Furry 01/15/20 OV note:  PAF - he is maintaining NSR despite stopping his amiodarone)  Spoke with wife and reports patient is feeling well at this time.  Denies fluid symptoms.    CorvueThoracic impedancenormal.   Prescribed:  Furosemide 40 mgAlternate taking 20 mg daily and 40 mg daily  Aspirin 81 mg daily  Labs: 06/12/2018 Creatinine1.0, BUN9, Potassium4.2, Sodium136, eGFR78.5 12/09/2017 Creatinine0.9, BUN7, Potassium4.4, Sodium134, eGFR88.7 A complete set of results can be found in Results Review.  Recommendations:No changes and encouraged to call if experiencing any fluid symptoms.  Follow-up plan: ICM clinic phone appointment on6/03/2020.91 day device clinic remote transmission5/06/2020.Office visit with Dr Ellyn Hack 02/22/2020.  Copy of ICM check sent to Dr.Taylor.  3 month ICM trend: 01/22/2020    1 Year ICM trend:       Rosalene Billings, RN 01/23/2020 9:41 AM

## 2020-01-23 NOTE — Telephone Encounter (Signed)
Returned call to wife (ok per DPR)-ok to assist patient to appt.

## 2020-01-23 NOTE — Telephone Encounter (Cosign Needed)
New Message  Patient's wife is calling in to get approval to accompany patient to his appointment with Dr Herbie Baltimore on 02/22/20. States that patient is very hard of hearing and will need her there with him. Please call and confirm.

## 2020-01-28 ENCOUNTER — Ambulatory Visit (INDEPENDENT_AMBULATORY_CARE_PROVIDER_SITE_OTHER): Payer: Medicare Other | Admitting: *Deleted

## 2020-01-28 DIAGNOSIS — I255 Ischemic cardiomyopathy: Secondary | ICD-10-CM | POA: Diagnosis not present

## 2020-01-28 LAB — CUP PACEART REMOTE DEVICE CHECK
Battery Remaining Longevity: 45 mo
Battery Remaining Percentage: 53 %
Battery Voltage: 2.93 V
Brady Statistic AP VP Percent: 1 %
Brady Statistic AP VS Percent: 1 %
Brady Statistic AS VP Percent: 91 %
Brady Statistic AS VS Percent: 4.1 %
Brady Statistic RA Percent Paced: 1 %
Date Time Interrogation Session: 20210510023823
HighPow Impedance: 54 Ohm
HighPow Impedance: 54 Ohm
Implantable Lead Implant Date: 20171215
Implantable Lead Implant Date: 20171215
Implantable Lead Implant Date: 20171215
Implantable Lead Location: 753858
Implantable Lead Location: 753859
Implantable Lead Location: 753860
Implantable Lead Model: 7122
Implantable Pulse Generator Implant Date: 20171215
Lead Channel Impedance Value: 330 Ohm
Lead Channel Impedance Value: 400 Ohm
Lead Channel Impedance Value: 450 Ohm
Lead Channel Pacing Threshold Amplitude: 0.5 V
Lead Channel Pacing Threshold Amplitude: 0.75 V
Lead Channel Pacing Threshold Amplitude: 1 V
Lead Channel Pacing Threshold Pulse Width: 0.5 ms
Lead Channel Pacing Threshold Pulse Width: 0.5 ms
Lead Channel Pacing Threshold Pulse Width: 0.5 ms
Lead Channel Sensing Intrinsic Amplitude: 1.8 mV
Lead Channel Sensing Intrinsic Amplitude: 8.7 mV
Lead Channel Setting Pacing Amplitude: 2 V
Lead Channel Setting Pacing Amplitude: 2 V
Lead Channel Setting Pacing Amplitude: 2 V
Lead Channel Setting Pacing Pulse Width: 0.5 ms
Lead Channel Setting Pacing Pulse Width: 0.5 ms
Lead Channel Setting Sensing Sensitivity: 0.5 mV
Pulse Gen Serial Number: 7368976

## 2020-01-29 NOTE — Progress Notes (Signed)
Remote ICD transmission.   

## 2020-02-22 ENCOUNTER — Encounter: Payer: Self-pay | Admitting: Cardiology

## 2020-02-22 ENCOUNTER — Ambulatory Visit (INDEPENDENT_AMBULATORY_CARE_PROVIDER_SITE_OTHER): Payer: Medicare Other | Admitting: Cardiology

## 2020-02-22 ENCOUNTER — Other Ambulatory Visit: Payer: Self-pay

## 2020-02-22 VITALS — BP 120/62 | HR 76 | Temp 97.2°F | Ht 66.0 in | Wt 150.0 lb

## 2020-02-22 DIAGNOSIS — F17200 Nicotine dependence, unspecified, uncomplicated: Secondary | ICD-10-CM

## 2020-02-22 DIAGNOSIS — I472 Ventricular tachycardia, unspecified: Secondary | ICD-10-CM

## 2020-02-22 DIAGNOSIS — Z95828 Presence of other vascular implants and grafts: Secondary | ICD-10-CM | POA: Diagnosis not present

## 2020-02-22 DIAGNOSIS — I5020 Unspecified systolic (congestive) heart failure: Secondary | ICD-10-CM | POA: Diagnosis not present

## 2020-02-22 DIAGNOSIS — I480 Paroxysmal atrial fibrillation: Secondary | ICD-10-CM

## 2020-02-22 DIAGNOSIS — I2510 Atherosclerotic heart disease of native coronary artery without angina pectoris: Secondary | ICD-10-CM

## 2020-02-22 DIAGNOSIS — I255 Ischemic cardiomyopathy: Secondary | ICD-10-CM | POA: Diagnosis not present

## 2020-02-22 DIAGNOSIS — I420 Dilated cardiomyopathy: Secondary | ICD-10-CM

## 2020-02-22 DIAGNOSIS — E119 Type 2 diabetes mellitus without complications: Secondary | ICD-10-CM

## 2020-02-22 DIAGNOSIS — E785 Hyperlipidemia, unspecified: Secondary | ICD-10-CM | POA: Diagnosis not present

## 2020-02-22 DIAGNOSIS — E1169 Type 2 diabetes mellitus with other specified complication: Secondary | ICD-10-CM | POA: Diagnosis not present

## 2020-02-22 NOTE — Progress Notes (Signed)
Primary Care Provider: Charolett Bumpers, PA-C Cardiologist: [name] Lemma, [name] Electrophysiologist: [name] Bunting, [name]  Clinic Note: Chief Complaint  Patient presents with  . Follow-up    5 months  . Coronary Artery Disease    History of CABG x3, no angina  . Cardiomyopathy    Ischemic, class II symptoms.    HPI:    [name] [name] is a 74 y.o. 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  CARDIOVASCULAR HISTORY  AoBiFem Bypass in Feb 2014 [facility])-complicated by ischemic VT postop (never had angina)  February 2014: MV CAD -> 3 V CABG @ [facility] (March 2014)->relook Cath in July 2014.   Post-op CABG was complicated by Sternal Wound Infxn --> January 2015: Redo-sternotomy with Steel-Plate insertion, (on lifelong Dicloxacillin).  After his CABG, he was lost to Cardiology F/u (by his report, the [facility] never [facility] follow-up with Cardiology).  December 2017:Admitted to [facility] --> Sx c/w Acute CHF & possible Unstable Angina ? Echo: EF ~20%, sustained VT on monitor (actually the cause for CP -->  ? CATH with patent grafts and no obvious culprit lesion-complicated by VT.. ? Status post BiVICD (CRTD) placement by [name] [name] ;   Unable to convert to [facility] b/c not covered by [place] (*despite guideline direction), Valsartan -> Losartan & now back to Valsartan.  Amiodarone d/c'd due to concern for possible Pulmonary Toxicity (Interstitial Lung Dz) along with COPD.  ? PAF documented on ICD interrogation -> although never truly documented in the note and never started on DOAC  December 28, 2019--device detected 2 episodes of sustained VT--successful conversion with ATP (patient was working out in the yard, did not recall any specific symptoms with exception of fatigue) -> no driving for 6 months  [name] [name] was  last seen here on September 26, 2019 via telemedicine  Recent Hospitalizations: None  Reviewed  CV studies:    The following studies were reviewed today: (if available, images/films reviewed: From Epic Chart or Care Everywhere)  ICD alert received for 2 VT-1 episodes on 12/28/19 treated with 1-2 bursts ATP. Both EGMs show late break after ATP delivery. See example episode below. 12 NSVT episodes also noted. Will assess symptoms and medication compliance. Pt is scheduled with [name] [name] on 01/15/20 at 3:30pm.        Interval History:   [name] [name] stating that he is doing fairly well overall from a cardiac standpoint.  When asked about the episode on April 9, he told me that he was outside doing gardening/tilling the garden and just felt a little more tired than usual on recollection.  He had to walk back inside to rest, but denied any sensation of irregular heartbeats or palpitations.  He may have a little bit more short of breath briefly, but not anything that he would have noted unless I mentioned it to him.  No chest pain or pressure with rest or exertion.  He says sometimes his balance is off in the mornings and it takes him just a little bit time to get going.  Once he is able to get up moving around, he does not really have any more balance issues nor does he have any orthostatic symptoms. Ever since he started alternating his Lasix every other day, he has not really had any issues with edema. He did  mention that his PCP increase his glipizide dose recently, and with this he seems to be having less urination.  Cardiovascular Review of Symptoms (Summary): no chest pain or dyspnea on exertion positive for - Mild fatigue and dizziness in the morning.  Better once he gets going. negative for - edema, irregular heartbeat, orthopnea, palpitations, paroxysmal nocturnal dyspnea, rapid heart rate, shortness of breath or Syncope/near syncope, TIA/amaurosis fugax, claudication  The patient  does not have symptoms concerning for COVID-19 infection (fever, chills, cough, or new shortness of breath).  The patient is practicing social distancing & Masking.  Immunization History  Administered Date(s) Administered  . Influenza Split 09/17/2011  . Influenza-Unspecified 09/20/2018  . Moderna SARS-COVID-2 Vaccination 10/10/2019, 11/09/2019  . Pneumococcal-Unspecified 11/12/2014    REVIEWED OF SYSTEMS   Review of Systems  Constitutional: Negative for malaise/fatigue (A little bit tired in the morning, and if he works hard is tired of end of the day.) and weight loss.  HENT: Negative for congestion and nosebleeds.   Respiratory: Negative for cough and shortness of breath.   Gastrointestinal: Negative for abdominal pain, blood in stool and melena.  Genitourinary: Negative for frequency and hematuria.  Musculoskeletal: Positive for joint pain (Mild aches and pains). Negative for falls.       Mild leg fatigue at the end of activity.  Neurological: Positive for dizziness (A little bit in the morning). Negative for focal weakness, weakness and headaches.  Endo/Heme/Allergies: Does not bruise/bleed easily.  Psychiatric/Behavioral: Negative.  Negative for memory loss.   I have reviewed and (if needed) personally updated the patient's problem list, medications, allergies, past medical and surgical history, social and family history.   PAST MEDICAL HISTORY   Past Medical History:  Diagnosis Date  . Acid reflux   . AICD (automatic cardioverter/defibrillator) present    a. 08/2016 St. Jude (serial Number [id number]) biventricular ICD.  [name] Chronic systolic CHF (congestive heart failure) (HCC)    a. 08/2016 Echo: EF 20%, diffuse HK, inf, post AK, mod-sev MR, sev dil LA, mod TR, PASP .  [name] Complication of anesthesia    "was told I responded well to anesthesia"  . Coronary artery disease involving native coronary artery of native heart with angina pectoris (HCC)    a. 10/2012 MI after  aortobifem bypass, found to have multivessel CAD -->CABG x3;  b. 08/2016 Cath: LM 60, LAD 27m, LCX 60ost/p, RCA 100p, VG->RPDA 50p, VG->OM1 ok, LIMA->LAD ok, EF 25%.  . Essential hypertension 03/03/2012  . History of blood transfusion 11/2012   "during his CABG"  . History of gout X 1  . History of stomach ulcers 1970s  . Hyperlipidemia associated with type 2 diabetes mellitus (HCC) 09/16/2011  . Ischemic cardiomyopathy    a. 08/2016 Echo: EF 20%, diffuse HK, inf, post AK;  b. 08/2016 St. Jude (serial Number [id number]) biventricular ICD.  [name] PAD (peripheral artery disease) (HCC)    s/p aortobifemoral bypass 10/2012  . PTSD (post-traumatic stress disorder)   . Type II diabetes mellitus (HCC)   . Ventricular tachycardia (HCC)    a. 08/2016 St. Jude (serial Number [id number]) biventricular ICD-->oral amio added.  . 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 Is now on chronic suppressive antibiotics with dicloxacillin    PAST SURGICAL HISTORY   Past Surgical History:  Procedure Laterality Date  . AORTO-FEMORAL BYPASS GRAFT Bilateral 10/2012   [facility]  . CARDIAC  CATHETERIZATION  04/03/2013   [facility] (? for abnormal Nuclear ST) --> no PCI, by report, patent grafts.  [name] CARDIAC CATHETERIZATION N/A 09/02/2016   Procedure: Left Heart Cath and Cors/Grafts Angiography;  Surgeon: Kathleene Hazel, [name];  Location: [facility] INVASIVE CV LAB:: Ost LM 60% -> mid LAD 99% subCTO, ost-prox LCx 60%; prox RCA 100% CTO w/ (small) SVG->RPDA prox 50%; SVG->OM1 ok, LIMA->mLAD ok, EF 25% - complicated by VT during access - Shock x 2, Lidocaine & Amiodarone  . CARDIAC CATHETERIZATION  11/09/2012   [facility]: due to Sustained VT post-Ob Ao-BiFem Bypass. (no Angina)  . CHOLECYSTECTOMY  03/05/2012   Procedure: LAPAROSCOPIC CHOLECYSTECTOMY WITH INTRAOPERATIVE CHOLANGIOGRAM;  Surgeon: Emelia Loron, [name];  Location: [facility] OR;  Service: General;  Laterality: N/A;  . CORONARY ARTERY BYPASS GRAFT  11/2012    [facility]: CABG X 3 -> LIMA-mLAD, SVG-highOM1, SVG-RPDA (dRCA).  . EP IMPLANTABLE DEVICE N/A 09/03/2016   Procedure: BI-VENTRICULAR IMPLANTABLE CARDIOVERTER DEFIBRILLATOR  (CRT-D);  Surgeon: Marinus Maw, [name];  Location: [facility] INVASIVE CV LAB;  Service: Cardiovascular;  Laterality: N/A;  . INGUINAL HERNIA REPAIR Left 1990s  . STERNOTOMY  09/2013   [facility] [place]): Sternal Wound Infection -> re-do sternotomy with metal place "sheild" placed. -Has chronic staph infection, on chronic suppressive medication  . TRANSTHORACIC ECHOCARDIOGRAM  02/28/2017   EF remains 20p-25% severely reduced function. Diffuse hypokinesis. "Grade 1 diastolic dysfunction ". Severely calcified aortic valve with restricted mobility (functioning bicuspid) however no suggestion of stenosis.  [name] UMBILICAL HERNIA REPAIR  03/05/2012   Procedure: HERNIA REPAIR UMBILICAL ADULT;  Surgeon: Emelia Loron, [name];  Location: [facility] OR;  Service: General;  Laterality: N/A;    January 2019 -> widely patent aortobifem bypass by ultrasound; left CFA 50-74%   Cath 08/2016.    MEDICATIONS/ALLERGIES   No outpatient medications have been marked as taking for the 02/22/20 encounter (Office Visit) with Marykay Lex, [name].    No Known Allergies  SOCIAL HISTORY/FAMILY HISTORY   Reviewed in Epic:  Pertinent findings: No changes.  OBJCTIVE -PE, EKG, labs   Wt Readings from Last 3 Encounters:  02/22/20 150 lb (68 kg)  01/15/20 146 lb 9.6 oz (66.5 kg)  09/26/19 143 lb (64.9 kg)    Physical Exam: BP 120/62 (BP Location: Right Arm, Patient Position: Sitting, Cuff Size: Normal)   Pulse 76   Temp (!) 97.2 F (36.2 C)   Ht 5\' 6"  (1.676 m)   Wt 150 lb (68 kg)   SpO2 99%   BMI 24.21 kg/m  Physical Exam Constitutional:      General: He is not in acute distress.    Appearance: Normal appearance. He is normal weight. He is not  ill-appearing.  HENT:     Head: Normocephalic and atraumatic.     Comments: Very poor dentition Neck:     Vascular: Carotid bruit (Cannot exclude bruit versus radiated murmur.) present.  Cardiovascular:     Rate and Rhythm: Normal rate and regular rhythm.  No extrasystoles are present.    Chest Wall: PMI is not displaced.     Pulses: Normal pulses.     Heart sounds: S1 normal and S2 normal. Murmur heard.  Medium-pitched harsh decrescendo midsystolic murmur is present with a grade of 2/6 at the upper right sternal border radiating to the neck.  No friction rub. Gallop present. S4 sounds present.   Pulmonary:     Effort: Pulmonary effort is normal. No respiratory distress.     Breath sounds:  No stridor. No wheezing, rhonchi or rales.     Comments: Mild diffuse interstitial sounds. Abdominal:     General: Bowel sounds are normal. There is no distension.     Palpations: Abdomen is soft.     Tenderness: There is no abdominal tenderness. There is no guarding.  Musculoskeletal:        General: Swelling (Trivial) present. Normal range of motion.     Cervical back: Normal range of motion and neck supple.  Neurological:     General: No focal deficit present.     Mental Status: He is alert and oriented to person, place, and time.  Psychiatric:        Mood and Affect: Mood normal.        Behavior: Behavior normal.        Thought Content: Thought content normal.        Judgment: Judgment normal.       Adult ECG Report Not checked  Recent Labs: March 2021: TC 122, TG 151, HDL 30, LDL 75   January 2020: TC 290, TG 130, HDL 25, LDL 75. Lab Results  Component Value Date   CHOL 87 09/02/2016   HDL 26 (L) 09/02/2016   LDLCALC 43 09/02/2016   TRIG 92 09/02/2016   CHOLHDL 3.3 09/02/2016   Lab Results  Component Value Date   CREATININE 1.08 10/27/2016   BUN 13 10/27/2016   NA 133 (L) 10/27/2016   K 4.9 10/27/2016   CL 94 (L) 10/27/2016   CO2 26 10/27/2016   Lab Results  Component  Value Date   TSH 6.87 (H) 09/08/2016    ASSESSMENT/PLAN    Problem List Items Addressed This Visit    Coronary artery disease involving native coronary artery of native heart without angina pectoris (Chronic)    Severe multivessel disease, never had anginal symptoms.  This was documented after aortic bifemoral surgery when he had VT. His last cath was in 2017, so we will need to discuss follow-up Myoview at next visit. Unfortunately, despite three-vessel CABG, his EF did not improve.  He is currently on stable dose of carvedilol and losartan (Entresto not covered by [place]) and has improved overall symptoms.  He is on moderate dose high intensity statin along with aspirin.  Plan: Continue current medications and discuss surveillance Myoview at next visit.      Ventricular tachycardia (HCC) (Chronic)    First documented VT on the ICD in quite a long time.  Successfully treated with ATP.  No longer on amiodarone because of toxicity concerns.  Remains on carvedilol 6.25 mg twice daily.  ICD working correctly.      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  Severely reduced EF, symptoms are notably improved with BiV ICD and medication adjustment.  EF only went up to 20-25%.  Volume status being monitored by OptiVol.  Plan:   Continue current dose of valsartan (Entresto not covered by [place]) and carvedilol.  Continue with Lasix alternating 20 and 40 mg daily.  With stable symptoms, will hold off on spironolactone and stable volumes.  We will also need to determine VA coverage for Jardiance or Farxiga, and if possible would like to switch from glipizide to SGLT2-inhibitor.      HFrEF (heart failure with reduced ejection fraction) (HCC) (Chronic)  Significantly reduced EF with NYHA  class II at worst symptoms.  He seems to be doing fairly well and compensating with the BiV ICD despite no improvement of EF.  As noted, on stable dose of ARB and carvedilol.  (Entresto not covered by [place]) He is on intermittent doses of 20 and 40 mg Lasix with sliding scale.  Overall for best guideline directed therapy, we would like to have him on Entresto and SGLT2 inhibitor such as [place] or [place].  (Would need to determine if either one of the SGLT2 inhibitors are covered by [facility])  For now continue current medications.       Status post aortobifemoral bypass surgery (Chronic)    We did not discuss this, but evaluation was January 2019.  We will order abdominal aortic ultrasound and lower extremity arterial ultrasound at next visit.      Diabetes mellitus type II, non insulin dependent (HCC) (Chronic)    On glipizide and Metformin, but for true guideline directed management especially with HFrEF, optimal therapy would be to add SGLT2 inhibitor (Farxiga/Jardiance)      Hyperlipidemia associated with type 2 diabetes mellitus (HCC) (Chronic)    Stable LDL of 75.  He is doing relatively well and stable.  We will discuss switching to rosuvastatin 40 mg versus adding Zetia at follow-up visit      Tobacco use disorder (Chronic)    He puffs on a pipe every now and then.  Has no intention of stopping, but does not smoke even every day.      Paroxysmal atrial fibrillation [facility])    This is listed as a diagnosis by [name] [name], but I see no documentation of him ever having atrial fibrillation.  No documented A. fib in over a year on ICD. He remains on beta-blocker, but is not on DOAC. Continue to monitor ICD interrogation for A. fib burden, if recurs, needs to be started on DOAC           COVID-19 Education: The signs and symptoms of COVID-19 were discussed with the patient and how to seek care for testing (follow up with PCP or arrange E-visit).   The importance of social distancing  and COVID-19 vaccination was discussed today.  I spent a total of 20 minutes with the patient. >  50% of the time was spent in direct patient consultation.  Additional time spent with chart review  / charting (studies, outside notes, etc): 16 Total Time: 36 min   Current medicines are reviewed at length with the patient today.  (+/- concerns) none  Notice: This dictation was prepared with Dragon dictation along with smaller phrase technology. Any transcriptional errors that result from this process are unintentional and may not be corrected upon review.  Patient Instructions / Medication Changes & Studies & Tests Ordered   Patient Instructions  Medication Instructions:  No changes *If you need a refill on your cardiac medications before your next appointment, please call your pharmacy*   Lab Work: Not needed If you have labs (blood work) drawn today and your tests are completely normal, you will receive your results only by: [name] MyChart Message (if you have MyChart) OR . A paper copy in the mail If you have any lab test that is abnormal or we need to change your treatment, we will call you to review the results.   Testing/Procedures: Not needed   Follow-Up: At [facility], you and your health needs are our priority.  As part of our continuing  mission to provide you with exceptional heart care, we have created designated Provider Care Teams.  These Care Teams include your primary Cardiologist (physician) and Advanced Practice Providers (APPs -  Physician Assistants and Nurse Practitioners) who all work together to provide you with the care you need, when you need it.   Your next appointment:   7 month(s)- Jan 2022  The format for your next appointment:   In Person  Provider:   Glenetta Hew, [name]       Studies Ordered:   No orders of the defined types were placed in this encounter.    Glenetta Hew, M.D., M.S. Interventional Cardiologist   Pager #  [phone] Phone # [phone] [address] [place], [place] [address]   Thank you for choosing Heartcare at [facility]!!

## 2020-02-22 NOTE — Patient Instructions (Signed)
Medication Instructions:  No changes *If you need a refill on your cardiac medications before your next appointment, please call your pharmacy*   Lab Work: Not needed If you have labs (blood work) drawn today and your tests are completely normal, you will receive your results only by:  MyChart Message (if you have MyChart) OR  A paper copy in the mail If you have any lab test that is abnormal or we need to change your treatment, we will call you to review the results.   Testing/Procedures: Not needed   Follow-Up: At [facility], you and your health needs are our priority.  As part of our continuing mission to provide you with exceptional heart care, we have created designated Provider Care Teams.  These Care Teams include your primary Cardiologist (physician) and Advanced Practice Providers (APPs -  Physician Assistants and Nurse Practitioners) who all work together to provide you with the care you need, when you need it.   Your next appointment:   7 month(s)- Jan 2022  The format for your next appointment:   In Person  Provider:   Bryan Lemma, MD

## 2020-02-25 ENCOUNTER — Ambulatory Visit: Payer: Medicare Other | Admitting: Cardiology

## 2020-02-25 ENCOUNTER — Ambulatory Visit (INDEPENDENT_AMBULATORY_CARE_PROVIDER_SITE_OTHER): Payer: Medicare Other

## 2020-02-25 DIAGNOSIS — Z95810 Presence of automatic (implantable) cardiac defibrillator: Secondary | ICD-10-CM | POA: Diagnosis not present

## 2020-02-25 DIAGNOSIS — I5022 Chronic systolic (congestive) heart failure: Secondary | ICD-10-CM | POA: Diagnosis not present

## 2020-02-27 NOTE — Progress Notes (Signed)
EPIC Encounter for ICM Monitoring  Patient Name: [name] [name] is a 74 y.o. male Date: 02/27/2020 Primary Care Physican: Merry Proud, PA-C Primary Cardiologist: Ellyn Hack  Electrophysiologist: Santina Evans Pacing: 93% 02/22/2020 Office Weight:150lbs  AT/AF Burden: 0% (Per Dr Tanna Furry 01/15/20 OV note:  PAF - he is maintaining NSR despite stopping his amiodarone)  Transmission reviewed.  CorvueThoracic impedancenormal.   Prescribed:  Furosemide 40 mgAlternate taking 20 mg daily and 40 mg daily  Aspirin 81 mg daily  Labs: 06/12/2018 Creatinine1.0, BUN9, Potassium4.2, Sodium136, eGFR78.5 12/09/2017 Creatinine0.9, BUN7, Potassium4.4, Sodium134, eGFR88.7 A complete set of results can be found in Results Review.  Recommendations:None  Follow-up plan: ICM clinic phone appointment on7/08/2020.91 day device clinic remote transmission8/05/2020.  Copy of ICM check sent to Dr.Taylor.  3 month ICM trend: 02/25/2020    1 Year ICM trend:       Rosalene Billings, RN 02/27/2020 10:19 AM

## 2020-02-29 ENCOUNTER — Encounter: Payer: Self-pay | Admitting: Cardiology

## 2020-02-29 NOTE — Assessment & Plan Note (Signed)
Hard to imagine with revascularization his EF did not improve, suspect that there must be some prior old infarct there was not diagnosed.  Severely reduced EF, symptoms are notably improved with BiV ICD and medication adjustment.  EF only went up to 20-25%.  Volume status being monitored by OptiVol.  Plan:   Continue current dose of valsartan (Entresto not covered by [place]) and carvedilol.  Continue with Lasix alternating 20 and 40 mg daily.  With stable symptoms, will hold off on spironolactone and stable volumes.  We will also need to determine VA coverage for Jardiance or Farxiga, and if possible would like to switch from glipizide to SGLT2-inhibitor.

## 2020-02-29 NOTE — Assessment & Plan Note (Signed)
Severe multivessel disease, never had anginal symptoms.  This was documented after aortic bifemoral surgery when he had VT. His last cath was in 2017, so we will need to discuss follow-up Myoview at next visit. Unfortunately, despite three-vessel CABG, his EF did not improve.  He is currently on stable dose of carvedilol and losartan (Entresto not covered by [place]) and has improved overall symptoms.  He is on moderate dose high intensity statin along with aspirin.  Plan: Continue current medications and discuss surveillance Myoview at next visit.

## 2020-02-29 NOTE — Assessment & Plan Note (Signed)
Significantly reduced EF with NYHA class II at worst symptoms.  He seems to be doing fairly well and compensating with the BiV ICD despite no improvement of EF.  As noted, on stable dose of ARB and carvedilol.  (Entresto not covered by [place]) He is on intermittent doses of 20 and 40 mg Lasix with sliding scale.  Overall for best guideline directed therapy, we would like to have him on Entresto and SGLT2 inhibitor such as [place] or [place].  (Would need to determine if either one of the SGLT2 inhibitors are covered by [facility])  For now continue current medications.

## 2020-02-29 NOTE — Assessment & Plan Note (Signed)
This is listed as a diagnosis by Dr. Ladona Ridgel, but I see no documentation of him ever having atrial fibrillation.  No documented A. fib in over a year on ICD. He remains on beta-blocker, but is not on DOAC. Continue to monitor ICD interrogation for A. fib burden, if recurs, needs to be started on DOAC

## 2020-02-29 NOTE — Assessment & Plan Note (Signed)
First documented VT on the ICD in quite a long time.  Successfully treated with ATP.  No longer on amiodarone because of toxicity concerns.  Remains on carvedilol 6.25 mg twice daily.  ICD working correctly.

## 2020-02-29 NOTE — Assessment & Plan Note (Signed)
He puffs on a pipe every now and then.  Has no intention of stopping, but does not smoke even every day.

## 2020-02-29 NOTE — Assessment & Plan Note (Signed)
On glipizide and Metformin, but for true guideline directed management especially with HFrEF, optimal therapy would be to add SGLT2 inhibitor (Farxiga/Jardiance)

## 2020-02-29 NOTE — Assessment & Plan Note (Signed)
We did not discuss this, but evaluation was January 2019.  We will order abdominal aortic ultrasound and lower extremity arterial ultrasound at next visit.

## 2020-02-29 NOTE — Assessment & Plan Note (Signed)
Stable LDL of 75.  He is doing relatively well and stable.  We will discuss switching to rosuvastatin 40 mg versus adding Zetia at follow-up visit

## 2020-03-31 ENCOUNTER — Ambulatory Visit (INDEPENDENT_AMBULATORY_CARE_PROVIDER_SITE_OTHER): Payer: Medicare Other

## 2020-03-31 DIAGNOSIS — I5022 Chronic systolic (congestive) heart failure: Secondary | ICD-10-CM | POA: Diagnosis not present

## 2020-03-31 DIAGNOSIS — Z95810 Presence of automatic (implantable) cardiac defibrillator: Secondary | ICD-10-CM

## 2020-03-31 NOTE — Progress Notes (Signed)
EPIC Encounter for ICM Monitoring  Patient Name: [name] [name] is a 74 y.o. male Date: 03/31/2020 Primary Care Physican: Merry Proud, PA-C Primary Cardiologist: Ellyn Hack  Electrophysiologist: Santina Evans Pacing:93% 6/4/2021OfficeWeight:150lbs  AT/AF Burden: 0% (Per Dr Tanna Furry 01/15/20 OV note:PAF - he is maintaining NSR despite stopping his amiodarone)  Spoke with patient and reports feeling well at this time.  Denies fluid symptoms.    Corvuethoracic impedance suggesting possible fluid accumulation starting 03/28/2020.   Prescribed:  Furosemide 40 mg tablets - Alternate taking 20 mg daily and 40 mg daily  Aspirin 81 mg daily  Labs: 06/12/2018 Creatinine1.0, BUN9, Potassium4.2, Sodium136, eGFR78.5 12/09/2017 Creatinine0.9, BUN7, Potassium4.4, Sodium134, eGFR88.7 A complete set of results can be found in Results Review.  Recommendations: Patient will take Furosemide 40 mg daily x 3 days and then return to alternating dosage.   Follow-up plan: ICM clinic phone appointment on 04/08/2020 to recheck fluid levels.   91 day device clinic remote transmission 04/28/2020.    EP/Cardiology Office Visits: Recall 09/23/2020 with Dr. Ellyn Hack and 01/12/2021 with Dr Lovena Le.    Copy of ICM check sent to Dr. Lovena Le and Dr. Ellyn Hack.   3 month ICM trend: 03/31/2020    1 Year ICM trend:       Rosalene Billings, RN 03/31/2020 3:44 PM

## 2020-04-08 ENCOUNTER — Ambulatory Visit (INDEPENDENT_AMBULATORY_CARE_PROVIDER_SITE_OTHER): Payer: Medicare Other

## 2020-04-08 DIAGNOSIS — Z95810 Presence of automatic (implantable) cardiac defibrillator: Secondary | ICD-10-CM

## 2020-04-08 DIAGNOSIS — I5022 Chronic systolic (congestive) heart failure: Secondary | ICD-10-CM

## 2020-04-08 NOTE — Progress Notes (Signed)
EPIC Encounter for ICM Monitoring  Patient Name: [name] [name] is a 74 y.o. male Date: 04/08/2020 Primary Care Physican: Merry Proud, PA-C Primary Cardiologist: Ellyn Hack  Electrophysiologist: Santina Evans Pacing:93% 6/4/2021OfficeWeight:150lbs  AT/AF Burden: 0% (Per Dr Tanna Furry 01/15/20 OV note:PAF - he is maintaining NSR despite stopping his amiodarone)  Transmission reviewed.  Corvuethoracic impedance returned to normal after taking extra Furosemide  Prescribed:  Furosemide 40 mg tablets - Alternate taking 20 mg daily and 40 mg daily  Aspirin 81 mg daily  Labs: 06/12/2018 Creatinine1.0, BUN9, Potassium4.2, Sodium136, eGFR78.5 12/09/2017 Creatinine0.9, BUN7, Potassium4.4, Sodium134, eGFR88.7 A complete set of results can be found in Results Review.  Recommendations: No changes  Follow-up plan: ICM clinic phone appointment on 05/05/2020.   91 day device clinic remote transmission 04/28/2020.    EP/Cardiology Office Visits: Recall 09/23/2020 with Dr. Ellyn Hack and 01/12/2021 with Dr Lovena Le.    Copy of ICM check sent to Dr. Lovena Le  3 month ICM trend: 04/08/2020    1 Year ICM trend:       Rosalene Billings, RN 04/08/2020 8:26 AM

## 2020-04-28 ENCOUNTER — Ambulatory Visit (INDEPENDENT_AMBULATORY_CARE_PROVIDER_SITE_OTHER): Payer: Medicare Other | Admitting: *Deleted

## 2020-04-28 DIAGNOSIS — I480 Paroxysmal atrial fibrillation: Secondary | ICD-10-CM | POA: Diagnosis not present

## 2020-04-30 LAB — CUP PACEART REMOTE DEVICE CHECK
Battery Remaining Longevity: 44 mo
Battery Remaining Percentage: 51 %
Battery Voltage: 2.93 V
Brady Statistic AP VP Percent: 1 %
Brady Statistic AP VS Percent: 1 %
Brady Statistic AS VP Percent: 93 %
Brady Statistic AS VS Percent: 2.6 %
Brady Statistic RA Percent Paced: 1 %
Date Time Interrogation Session: 20210809020023
HighPow Impedance: 60 Ohm
HighPow Impedance: 60 Ohm
Implantable Lead Implant Date: 20171215
Implantable Lead Implant Date: 20171215
Implantable Lead Implant Date: 20171215
Implantable Lead Location: 753858
Implantable Lead Location: 753859
Implantable Lead Location: 753860
Implantable Lead Model: 7122
Implantable Pulse Generator Implant Date: 20171215
Lead Channel Impedance Value: 350 Ohm
Lead Channel Impedance Value: 450 Ohm
Lead Channel Impedance Value: 590 Ohm
Lead Channel Pacing Threshold Amplitude: 0.5 V
Lead Channel Pacing Threshold Amplitude: 0.75 V
Lead Channel Pacing Threshold Amplitude: 1 V
Lead Channel Pacing Threshold Pulse Width: 0.5 ms
Lead Channel Pacing Threshold Pulse Width: 0.5 ms
Lead Channel Pacing Threshold Pulse Width: 0.5 ms
Lead Channel Sensing Intrinsic Amplitude: 10.2 mV
Lead Channel Sensing Intrinsic Amplitude: 3.6 mV
Lead Channel Setting Pacing Amplitude: 2 V
Lead Channel Setting Pacing Amplitude: 2 V
Lead Channel Setting Pacing Amplitude: 2 V
Lead Channel Setting Pacing Pulse Width: 0.5 ms
Lead Channel Setting Pacing Pulse Width: 0.5 ms
Lead Channel Setting Sensing Sensitivity: 0.5 mV
Pulse Gen Serial Number: 7368976

## 2020-05-01 NOTE — Progress Notes (Signed)
Remote ICD transmission.   

## 2020-05-05 ENCOUNTER — Ambulatory Visit (INDEPENDENT_AMBULATORY_CARE_PROVIDER_SITE_OTHER): Payer: Medicare Other

## 2020-05-05 DIAGNOSIS — Z95810 Presence of automatic (implantable) cardiac defibrillator: Secondary | ICD-10-CM

## 2020-05-05 DIAGNOSIS — I5022 Chronic systolic (congestive) heart failure: Secondary | ICD-10-CM

## 2020-05-09 ENCOUNTER — Telehealth: Payer: Self-pay

## 2020-05-09 NOTE — Progress Notes (Signed)
EPIC Encounter for ICM Monitoring  Patient Name: [name] [name] is a 74 y.o. male Date: 05/09/2020 Primary Care Physican: Merry Proud, PA-C Primary Cardiologist: Ellyn Hack  Electrophysiologist: Santina Evans Pacing:93% 6/4/2021OfficeWeight:150lbs  AT/AF Burden: 0%   Attempted call to patient and unable to reach.   Transmission reviewed.   Corvuethoracic impedancenormal.  Prescribed:  Furosemide 40 mgtablets -Alternate taking 20 mg daily and 40 mg daily  Aspirin 81 mg daily  Labs: 06/12/2018 Creatinine1.0, BUN9, Potassium4.2, Sodium136, eGFR78.5 12/09/2017 Creatinine0.9, BUN7, Potassium4.4, Sodium134, eGFR88.7 A complete set of results can be found in Results Review.  Recommendations:Unable to reach.    Follow-up plan: ICM clinic phone appointment on9/20/2021. 91 day device clinic remote transmission 07/28/2020.   EP/Cardiology Office Visits:Recall 1/4/2022with Dr. Ellyn Hack and 01/12/2021 with Dr Lovena Le.   Copy of ICM check sent to Dr.Taylor   3 month ICM trend: 05/05/2020    1 Year ICM trend:       Rosalene Billings, RN 05/09/2020 2:59 PM

## 2020-05-09 NOTE — Telephone Encounter (Signed)
Remote ICM transmission received.  Attempted call to patient regarding ICM remote transmission and recording stated not accepting calls at this time.

## 2020-05-16 ENCOUNTER — Other Ambulatory Visit: Payer: Self-pay | Admitting: Pulmonary Disease

## 2020-05-16 DIAGNOSIS — R918 Other nonspecific abnormal finding of lung field: Secondary | ICD-10-CM

## 2020-06-09 ENCOUNTER — Ambulatory Visit (INDEPENDENT_AMBULATORY_CARE_PROVIDER_SITE_OTHER): Payer: Medicare Other

## 2020-06-09 DIAGNOSIS — Z95810 Presence of automatic (implantable) cardiac defibrillator: Secondary | ICD-10-CM

## 2020-06-09 DIAGNOSIS — I5022 Chronic systolic (congestive) heart failure: Secondary | ICD-10-CM

## 2020-06-11 NOTE — Progress Notes (Signed)
EPIC Encounter for ICM Monitoring  Patient Name: NGOC DAUGHTRIDGE is a 74 y.o. male Date: 06/11/2020 Primary Care Physican: Merry Proud, PA-C Primary Cardiologist: Ellyn Hack  Electrophysiologist: Santina Evans Pacing:93% 6/4/2021OfficeWeight:150lbs  AT/AF Burden: <1%   Transmission reviewed.   Corvuethoracic impedancenormal.  Prescribed:  Furosemide 40 mgtablets -Alternate taking 20 mg daily and 40 mg daily  Aspirin 81 mg daily  Labs: 06/12/2018 Creatinine1.0, BUN9, Potassium4.2, Sodium136, eGFR78.5 12/09/2017 Creatinine0.9, BUN7, Potassium4.4, Sodium134, eGFR88.7 A complete set of results can be found in Results Review.  Recommendations:No changes  Follow-up plan: ICM clinic phone appointment on10/25/2021. 91 day device clinic remote transmission 07/28/2020.   EP/Cardiology Office Visits:Recall 1/4/2022with Dr. Ellyn Hack and 01/12/2021 with Dr Lovena Le.   Copy of ICM check sent to Dr.Taylor   3 month ICM trend: 06/09/2020    1 Year ICM trend:       Rosalene Billings, RN 06/11/2020 9:17 AM

## 2020-07-14 ENCOUNTER — Ambulatory Visit (INDEPENDENT_AMBULATORY_CARE_PROVIDER_SITE_OTHER): Payer: Medicare Other

## 2020-07-14 ENCOUNTER — Telehealth: Payer: Self-pay

## 2020-07-14 DIAGNOSIS — I5022 Chronic systolic (congestive) heart failure: Secondary | ICD-10-CM | POA: Diagnosis not present

## 2020-07-14 DIAGNOSIS — Z95810 Presence of automatic (implantable) cardiac defibrillator: Secondary | ICD-10-CM

## 2020-07-14 NOTE — Progress Notes (Signed)
EPIC Encounter for ICM Monitoring  Patient Name: [name] [name] is a 74 y.o. male Date: 07/14/2020 Primary Care Physican: Merry Proud, PA-C Primary Cardiologist: Ellyn Hack  Electrophysiologist: Santina Evans Pacing:94% 6/4/2021OfficeWeight:150lbs  AT/AF Burden: <1%   Attempted call to patient and unable to reach.  Left message to return call. Transmission reviewed.   Corvuethoracic impedancesuggesting possible fluid accumulation starting 07/05/2020.    Prescribed:  Furosemide 40 mgtablets -Alternate taking 20 mg daily and 40 mg daily  Aspirin 81 mg daily  Labs: 06/12/2018 Creatinine1.0, BUN9, Potassium4.2, Sodium136, eGFR78.5 12/09/2017 Creatinine0.9, BUN7, Potassium4.4, Sodium134, eGFR88.7 A complete set of results can be found in Results Review.  Recommendations:Unable to reach.    Follow-up plan: ICM clinic phone appointment on11/10/2019 to recheck fluid levels. 91 day device clinic remote transmission11/04/2020.   EP/Cardiology Office Visits:  1/4/2022with Dr. Ellyn Hack.  Recall for 01/12/2021 with Dr Lovena Le.   Copy of ICM check sent to Dr.Taylorand Dr Ellyn Hack for review.  3 month ICM trend: 07/14/2020    1 Year ICM trend:       Rosalene Billings, RN 07/14/2020 1:12 PM

## 2020-07-14 NOTE — Telephone Encounter (Signed)
Remote ICM transmission received.  Attempted call to patient regarding ICM remote transmission and left message to return call   

## 2020-07-15 NOTE — Progress Notes (Signed)
Spoke with wife.  She said patient has some shortness of breath but not sure if he has some congestion from a cold or if it is fluid.  Weight is stable at 145.8 - 147 lbs.  He does not have any extremity or abdominal swelling.  He has been eating foods higher in salt lately and drinking buttermilk which may contain salt as well.  Recommendation to limit salt intake to 2000 mg daily and fluid intake to 64 oz daily.    Advised to take Furosemide 40 mg x 3 days and then return to alternating dosage of 20 mg with 40 mg every other day.  She repeated instructions and verbalized understanding.

## 2020-07-22 ENCOUNTER — Ambulatory Visit (INDEPENDENT_AMBULATORY_CARE_PROVIDER_SITE_OTHER): Payer: Medicare Other

## 2020-07-22 DIAGNOSIS — Z95810 Presence of automatic (implantable) cardiac defibrillator: Secondary | ICD-10-CM

## 2020-07-22 DIAGNOSIS — I5022 Chronic systolic (congestive) heart failure: Secondary | ICD-10-CM

## 2020-07-25 NOTE — Progress Notes (Signed)
EPIC Encounter for ICM Monitoring  Patient Name: [name] [name] is a 74 y.o. male Date: 07/25/2020 Primary Care Physican: Merry Proud, PA-C Primary Cardiologist: Ellyn Hack  Electrophysiologist: Santina Evans Pacing:94% 10/25/2021OfficeWeight:145.8lbs  AT/AF Burden:<1%   Transmission reviewed.   Corvuethoracic impedancesuggesting fluid levels returned to normal.    Prescribed:  Furosemide 40 mgtablets -Alternate taking 20 mg daily and 40 mg daily  Aspirin 81 mg daily  Labs: 06/12/2018 Creatinine1.0, BUN9, Potassium4.2, Sodium136, eGFR78.5 12/09/2017 Creatinine0.9, BUN7, Potassium4.4, Sodium134, eGFR88.7 A complete set of results can be found in Results Review.  Recommendations:No changes.    Follow-up plan: ICM clinic phone appointment on12/03/2020.. 91 day device clinic remote transmission11/04/2020.   EP/Cardiology Office Visits:  1/4/2022with Dr. Ellyn Hack.  Recall for 01/12/2021 with Dr Lovena Le.   Copy of ICM check sent to Dr.Taylor  3 month ICM trend: 07/22/2020    1 Year ICM trend:       Rosalene Billings, RN 07/25/2020 5:22 PM

## 2020-08-19 ENCOUNTER — Ambulatory Visit (INDEPENDENT_AMBULATORY_CARE_PROVIDER_SITE_OTHER): Payer: Medicare Other

## 2020-08-19 DIAGNOSIS — I255 Ischemic cardiomyopathy: Secondary | ICD-10-CM | POA: Diagnosis not present

## 2020-08-19 LAB — CUP PACEART REMOTE DEVICE CHECK
Battery Remaining Longevity: 40 mo
Battery Remaining Percentage: 47 %
Battery Voltage: 2.92 V
Brady Statistic AP VP Percent: 1 %
Brady Statistic AP VS Percent: 1 %
Brady Statistic AS VP Percent: 94 %
Brady Statistic AS VS Percent: 2.2 %
Brady Statistic RA Percent Paced: 1 %
Date Time Interrogation Session: 20211130025424
HighPow Impedance: 53 Ohm
HighPow Impedance: 53 Ohm
Implantable Lead Implant Date: 20171215
Implantable Lead Implant Date: 20171215
Implantable Lead Implant Date: 20171215
Implantable Lead Location: 753858
Implantable Lead Location: 753859
Implantable Lead Location: 753860
Implantable Lead Model: 7122
Implantable Pulse Generator Implant Date: 20171215
Lead Channel Impedance Value: 310 Ohm
Lead Channel Impedance Value: 410 Ohm
Lead Channel Impedance Value: 450 Ohm
Lead Channel Pacing Threshold Amplitude: 0.5 V
Lead Channel Pacing Threshold Amplitude: 0.75 V
Lead Channel Pacing Threshold Amplitude: 1 V
Lead Channel Pacing Threshold Pulse Width: 0.5 ms
Lead Channel Pacing Threshold Pulse Width: 0.5 ms
Lead Channel Pacing Threshold Pulse Width: 0.5 ms
Lead Channel Sensing Intrinsic Amplitude: 3.3 mV
Lead Channel Sensing Intrinsic Amplitude: 6.4 mV
Lead Channel Setting Pacing Amplitude: 2 V
Lead Channel Setting Pacing Amplitude: 2 V
Lead Channel Setting Pacing Amplitude: 2 V
Lead Channel Setting Pacing Pulse Width: 0.5 ms
Lead Channel Setting Pacing Pulse Width: 0.5 ms
Lead Channel Setting Sensing Sensitivity: 0.5 mV
Pulse Gen Serial Number: 7368976

## 2020-08-26 ENCOUNTER — Ambulatory Visit (INDEPENDENT_AMBULATORY_CARE_PROVIDER_SITE_OTHER): Payer: Medicare Other

## 2020-08-26 DIAGNOSIS — I5022 Chronic systolic (congestive) heart failure: Secondary | ICD-10-CM

## 2020-08-26 DIAGNOSIS — Z95810 Presence of automatic (implantable) cardiac defibrillator: Secondary | ICD-10-CM | POA: Diagnosis not present

## 2020-08-26 NOTE — Progress Notes (Signed)
Remote ICD transmission.   

## 2020-08-27 NOTE — Progress Notes (Signed)
EPIC Encounter for ICM Monitoring  Patient Name: [name] [name] is a 74 y.o. male Date: 08/27/2020 Primary Care Physican: Merry Proud, PA-C Primary Cardiologist: Ellyn Hack  Electrophysiologist: Santina Evans Pacing:94%(per 08/19/20 report) 10/25/2021OfficeWeight:145.8lbs   Transmission reviewed.  Corvuethoracic impedancesuggesting normal fluid levels.  Prescribed:  Furosemide 40 mgtablets -Alternate taking 20 mg daily and 40 mg daily  Aspirin 81 mg daily  Labs: 06/12/2018 Creatinine1.0, BUN9, Potassium4.2, Sodium136, eGFR78.5 12/09/2017 Creatinine0.9, BUN7, Potassium4.4, Sodium134, eGFR88.7 A complete set of results can be found in Results Review.  Recommendations:No changes.  Follow-up plan: ICM clinic phone appointment on1/07/2021. 91 day device clinic remote transmission3/09/2020.   EP/Cardiology Office Visits: 1/4/2022with Dr. Ellyn Hack. Recall for 01/12/2021 with Dr Lovena Le.   Copy of ICM check sent to Dr.Taylor  3 month ICM trend: 08/23/2020    Rosalene Billings, RN 08/27/2020 5:08 PM

## 2020-09-22 ENCOUNTER — Telehealth: Payer: Self-pay | Admitting: Cardiology

## 2020-09-22 NOTE — Telephone Encounter (Signed)
RN spoke to patient's wife and patient is in the background..  Patient is not uncomfortable about coming into the office due to covid. RN offered an virtual appointment on 09/26/19 at 9 am.  Wife declined patient does not get up that early .  Next available will be in April 2022. Wife states patient will take that appointment December 29, 2020 - 3:40 am. Wife verbalized understanding.

## 2020-09-22 NOTE — Telephone Encounter (Signed)
Pts wife called in this morning and stated that pt has an appt tomorrow with Dr Herbie Baltimore.  She stated pt is uncomfortable with coming in office due to the raise of covid cases.  He he would like to make that appt a VT.    Please advise   Best number for VT is [phone]

## 2020-09-23 ENCOUNTER — Ambulatory Visit: Payer: Medicare Other | Admitting: Cardiology

## 2020-09-30 ENCOUNTER — Ambulatory Visit (INDEPENDENT_AMBULATORY_CARE_PROVIDER_SITE_OTHER): Payer: Medicare Other

## 2020-09-30 DIAGNOSIS — Z95810 Presence of automatic (implantable) cardiac defibrillator: Secondary | ICD-10-CM

## 2020-09-30 DIAGNOSIS — I5022 Chronic systolic (congestive) heart failure: Secondary | ICD-10-CM

## 2020-10-03 NOTE — Progress Notes (Signed)
EPIC Encounter for ICM Monitoring  Patient Name: [name] [name] is a 75 y.o. male Date: 10/03/2020 Primary Care Physican: Charolett Bumpers, PA-C Primary Cardiologist: Herbie Baltimore  Electrophysiologist: Rae Roam Pacing:94% 1/14/2022Weight:139-142lbs   Spoke with wife and reports feeling well at this time.  Denies fluid symptoms.  Patient had [place] last week resulting a weight gain of couple of pounds.  Weight has since returned to baseline.  Corvuethoracic impedancesuggestingnormal fluid levels.  Prescribed:  Furosemide 40 mgtablets -Alternate taking 20 mg daily and 40 mg daily  Aspirin 81 mg daily  Labs: 12/12/2019 Creatinine 0.9, BUN 6, Potassium 4.5, Sodium 133, GFR 88.2 A complete set of results can be found in Results Review.  Recommendations:No changes and encouraged to call if experiencing any fluid symptoms.  Follow-up plan: ICM clinic phone appointment on2/21/2022. 91 day device clinic remote transmission3/09/2020.   EP/Cardiology Office Visits: 4/1/2022with Dr. Herbie Baltimore. Recall for 01/12/2021 with Dr Ladona Ridgel.   Copy of ICM check sent to Dr.Taylor  3 month ICM trend: 10/03/2020.    1 Year ICM trend:       Karie Soda, RN 10/03/2020 10:10 AM

## 2020-11-10 ENCOUNTER — Ambulatory Visit (INDEPENDENT_AMBULATORY_CARE_PROVIDER_SITE_OTHER): Payer: Medicare Other

## 2020-11-10 DIAGNOSIS — I5022 Chronic systolic (congestive) heart failure: Secondary | ICD-10-CM

## 2020-11-10 DIAGNOSIS — Z95810 Presence of automatic (implantable) cardiac defibrillator: Secondary | ICD-10-CM

## 2020-11-18 ENCOUNTER — Ambulatory Visit (INDEPENDENT_AMBULATORY_CARE_PROVIDER_SITE_OTHER): Payer: Medicare Other

## 2020-11-18 DIAGNOSIS — I255 Ischemic cardiomyopathy: Secondary | ICD-10-CM

## 2020-11-18 DIAGNOSIS — I5022 Chronic systolic (congestive) heart failure: Secondary | ICD-10-CM | POA: Diagnosis not present

## 2020-11-18 NOTE — Progress Notes (Signed)
EPIC Encounter for ICM Monitoring  Patient Name: [name] [name] is a 75 y.o. male Date: 11/18/2020 Primary Care Physican: Charolett Bumpers, PA-C Primary Cardiologist: Herbie Baltimore  Electrophysiologist: Rae Roam Pacing:95% 1/14/2022Weight:139-142lbs   Transmission reviewed.   Corvuethoracic impedancesuggestingnormal fluid levels.  Prescribed:  Furosemide 40 mgtablets -Alternate taking 20 mg daily and 40 mg daily  Aspirin 81 mg daily  Labs: 12/12/2019 Creatinine 0.9, BUN 6, Potassium 4.5, Sodium 133, GFR 88.2 A complete set of results can be found in Results Review.  Recommendations:No changes.  Follow-up plan: ICM clinic phone appointment on2/21/2022. 91 day device clinic remote transmission3/09/2020.   EP/Cardiology Office Visits: 4/1/2022with Dr. Herbie Baltimore. Recall for 01/12/2021 with Dr Ladona Ridgel.   Copy of ICM check sent to Dr.Taylor   Direct Trend Data through 11/15/2020    3 month ICM trend: 11/10/2020.    1 Year ICM trend:       Karie Soda, RN 11/18/2020 12:52 PM

## 2020-11-19 ENCOUNTER — Telehealth: Payer: Self-pay | Admitting: Internal Medicine

## 2020-11-19 NOTE — Telephone Encounter (Signed)
Wife of the patient called. The wife called tech support for the patient's monitor. The tech support noted that they got the patient's home remote transmission and his machine is working fine. If it is still not in our system please let her know.

## 2020-11-19 NOTE — Telephone Encounter (Signed)
Transmission received 11/19/2020. I let the patient know we got the transmission.

## 2020-11-20 LAB — CUP PACEART REMOTE DEVICE CHECK
Battery Remaining Longevity: 37 mo
Battery Remaining Percentage: 44 %
Battery Voltage: 2.92 V
Brady Statistic AP VP Percent: 1 %
Brady Statistic AP VS Percent: 1 %
Brady Statistic AS VP Percent: 95 %
Brady Statistic AS VS Percent: 1.9 %
Brady Statistic RA Percent Paced: 1 %
Date Time Interrogation Session: 20220302114833
HighPow Impedance: 51 Ohm
HighPow Impedance: 51 Ohm
Implantable Lead Implant Date: 20171215
Implantable Lead Implant Date: 20171215
Implantable Lead Implant Date: 20171215
Implantable Lead Location: 753858
Implantable Lead Location: 753859
Implantable Lead Location: 753860
Implantable Lead Model: 7122
Implantable Pulse Generator Implant Date: 20171215
Lead Channel Impedance Value: 310 Ohm
Lead Channel Impedance Value: 410 Ohm
Lead Channel Impedance Value: 510 Ohm
Lead Channel Pacing Threshold Amplitude: 0.5 V
Lead Channel Pacing Threshold Amplitude: 0.75 V
Lead Channel Pacing Threshold Amplitude: 1 V
Lead Channel Pacing Threshold Pulse Width: 0.5 ms
Lead Channel Pacing Threshold Pulse Width: 0.5 ms
Lead Channel Pacing Threshold Pulse Width: 0.5 ms
Lead Channel Sensing Intrinsic Amplitude: 3.2 mV
Lead Channel Sensing Intrinsic Amplitude: 9.6 mV
Lead Channel Setting Pacing Amplitude: 2 V
Lead Channel Setting Pacing Amplitude: 2 V
Lead Channel Setting Pacing Amplitude: 2 V
Lead Channel Setting Pacing Pulse Width: 0.5 ms
Lead Channel Setting Pacing Pulse Width: 0.5 ms
Lead Channel Setting Sensing Sensitivity: 0.5 mV
Pulse Gen Serial Number: 7368976

## 2020-11-27 NOTE — Progress Notes (Signed)
Remote pacemaker transmission.   

## 2020-12-22 ENCOUNTER — Ambulatory Visit (INDEPENDENT_AMBULATORY_CARE_PROVIDER_SITE_OTHER): Payer: Medicare Other

## 2020-12-22 DIAGNOSIS — Z95810 Presence of automatic (implantable) cardiac defibrillator: Secondary | ICD-10-CM

## 2020-12-22 DIAGNOSIS — I5022 Chronic systolic (congestive) heart failure: Secondary | ICD-10-CM

## 2020-12-23 NOTE — Progress Notes (Signed)
EPIC Encounter for ICM Monitoring  Patient Name: [name] [name] is a 75 y.o. male Date: 12/23/2020 Primary Care Physican: Charolett Bumpers, PA-C Primary Cardiologist: Herbie Baltimore  Electrophysiologist: Rae Roam Pacing:96% 1/14/2022Weight:139-142lbs   Spoke with wife and she reports pt is feeling well at this time.  Denies fluid symptoms.    Corvuethoracic impedancesuggestingnormal fluid levels.  Prescribed:  Furosemide 40 mgtablets -Alternate taking 20 mg daily and 40 mg daily  Aspirin 81 mg daily  Labs: 12/12/2019 Creatinine 0.9, BUN 6, Potassium 4.5, Sodium 133, GFR 88.2 A complete set of results can be found in Results Review.  Recommendations: No changes and encouraged to call if experiencing any fluid symptoms.  Follow-up plan: ICM clinic phone appointment on5/01/2021. 91 day device clinic remote transmission5/31/2022.   EP/Cardiology Office Visits: 4/11/2022with Dr. Herbie Baltimore. 02/05/2021 with Dr Ladona Ridgel.   Copy of ICM check sent to Dr.Taylor   3 month ICM trend: 12/22/2020.    1 Year ICM trend:     Karie Soda, RN 12/23/2020 8:47 AM

## 2020-12-29 ENCOUNTER — Encounter: Payer: Self-pay | Admitting: Cardiology

## 2020-12-29 ENCOUNTER — Ambulatory Visit (INDEPENDENT_AMBULATORY_CARE_PROVIDER_SITE_OTHER): Payer: Medicare Other | Admitting: Cardiology

## 2020-12-29 ENCOUNTER — Other Ambulatory Visit: Payer: Self-pay

## 2020-12-29 VITALS — BP 126/66 | HR 70 | Ht 66.0 in | Wt 141.0 lb

## 2020-12-29 DIAGNOSIS — I420 Dilated cardiomyopathy: Secondary | ICD-10-CM

## 2020-12-29 DIAGNOSIS — E1169 Type 2 diabetes mellitus with other specified complication: Secondary | ICD-10-CM

## 2020-12-29 DIAGNOSIS — Z95828 Presence of other vascular implants and grafts: Secondary | ICD-10-CM | POA: Diagnosis not present

## 2020-12-29 DIAGNOSIS — Z9889 Other specified postprocedural states: Secondary | ICD-10-CM | POA: Diagnosis not present

## 2020-12-29 DIAGNOSIS — I200 Unstable angina: Secondary | ICD-10-CM | POA: Diagnosis not present

## 2020-12-29 DIAGNOSIS — I472 Ventricular tachycardia, unspecified: Secondary | ICD-10-CM

## 2020-12-29 DIAGNOSIS — E785 Hyperlipidemia, unspecified: Secondary | ICD-10-CM

## 2020-12-29 DIAGNOSIS — Z95810 Presence of automatic (implantable) cardiac defibrillator: Secondary | ICD-10-CM

## 2020-12-29 DIAGNOSIS — I739 Peripheral vascular disease, unspecified: Secondary | ICD-10-CM | POA: Diagnosis not present

## 2020-12-29 DIAGNOSIS — I480 Paroxysmal atrial fibrillation: Secondary | ICD-10-CM | POA: Diagnosis not present

## 2020-12-29 DIAGNOSIS — I2510 Atherosclerotic heart disease of native coronary artery without angina pectoris: Secondary | ICD-10-CM | POA: Diagnosis not present

## 2020-12-29 DIAGNOSIS — I10 Essential (primary) hypertension: Secondary | ICD-10-CM | POA: Diagnosis not present

## 2020-12-29 DIAGNOSIS — Z8679 Personal history of other diseases of the circulatory system: Secondary | ICD-10-CM | POA: Insufficient documentation

## 2020-12-29 DIAGNOSIS — Z951 Presence of aortocoronary bypass graft: Secondary | ICD-10-CM

## 2020-12-29 DIAGNOSIS — I5020 Unspecified systolic (congestive) heart failure: Secondary | ICD-10-CM

## 2020-12-29 NOTE — Progress Notes (Signed)
Primary Care Provider: Merry Proud, PA-C Cardiologist: Glenetta Hew, MD Electrophysiologist: Cristopher Peru, MD  Clinic Note: Chief Complaint  Patient presents with  . Follow-up    No active complaints  . Coronary Artery Disease    No angina, activity limited by leg discomfort and weakness  . Cardiomyopathy    NYHA class I -2 symptoms   ===================================  ASSESSMENT/PLAN   Problem List Items Addressed This Visit    ICD (implantable cardioverter-defibrillator) in place   S/P AAA repair    Due for follow-up AAA Doppler and ABIs.      Relevant Orders   VAS US AORTA/IVC/ILIACS   Progressive angina ([place]) - atypical - related to arrhythmia (Chronic)    Has not had any further episodes since ICD placement.  Cardiac catheter stable coronaries with patent grafts. Maintained on carvedilol, losartan (based on [place]).  On multiple high-dose atorvastatin, tolerating well. On aspirin alone      Coronary artery disease involving native coronary artery of native heart without angina pectoris (Chronic)    He has not had anginal symptoms in multiple years.  Has not required any nitroglycerin although he has prescription for it.  He has not had ischemic evaluation in quite some time, per previous discussion, we has been holding off on doing evaluations unless he actually has true symptoms.  Plan: Continue carvedilol and valsartan -> Discuss stress testing at follow-up visit.      Relevant Orders   ECHOCARDIOGRAM COMPLETE   VAS [place] ABI WITH/WO TBI   Ventricular tachycardia (HCC) (Chronic)    Intermittent episodes, he does not recall any recent ATP episodes.  Relatively asymptomatic. No longer on amiodarone.  Remains on carvedilol. ICD in place.-CRT-D, monitored by Dr. Lovena Le -> functioning well.      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  Has not had recent echo.  Plan: Check an echo to reassess EF is new baseline.      Relevant Orders   ECHOCARDIOGRAM COMPLETE   HFrEF (heart failure with reduced ejection fraction) (HCC) (Chronic)    Chronic HFrEF with reduced EF.  NYHA class I-II symptoms at worst.  Edema well controlled with current dose of Lasix, azelastine alternates high in low doses.  We did discuss sliding scale.  He has not required any additional dosing. He is on carvedilol and unfortunately only on losartan with adequate blood pressure control.  (Despite efforts-we are not able to get him to start on Entresto, but finally converted from losartan to valsartan.  [facility] not [facility].  If the [facility] were to adopt guideline directed therapy, we can start him on Entresto.),  For similar reasons as noted with Delene Loll, he is also not SGLT2 inhibitor.  He is on Metformin and glipizide. => Would ask that his PCP for the [facility] consider SGLT2 inhibitor.   Plan: Continue current meds      Relevant Orders   ECHOCARDIOGRAM COMPLETE   Status post aortobifemoral bypass surgery (Chronic)    Due for follow-up Dopplers to be checked.  We will check AAA Doppler as well as ABIs.      Relevant Orders   VAS US AORTA/IVC/ILIACS   Hyperlipidemia associated with type 2 diabetes mellitus ([place]) (Chronic)    Lipids well controlled per PCP notes.  Continue current dose of statin.      Essential hypertension (Chronic)    Stable  blood pressure on current dose of beta-blocker and ARB.  Has had some orthostatic issues in the past to be more aggressive.      Relevant Orders   VAS [place] ABI WITH/WO TBI   PAD (peripheral artery disease) ([place]) (Chronic)    Status post aortobifem bypass in 2014.  He is noticing leg fatigue with walking.  He is overdue for aortobifem Dopplers and ABIs.  Symptoms are not necessarily consistent with claudication, need to check.  Continue cardiovascular risk factor  reduction medications.      Relevant Orders   VAS [place] ABI WITH/WO TBI   VAS US AORTA/IVC/ILIACS   S/P CABG x 3 (Chronic)    To colectomy ischemic evaluation.  This can be done prior to follow-up next year.      Paroxysmal atrial fibrillation (HCC) (Chronic)    As far can tell, no documentation of A. fib since he has had his ICD placed.  There is a thought of possible A. fib, but has not been seen.  Therefore not DOAC.       Relevant Orders   ECHOCARDIOGRAM COMPLETE     ===================================   HPI:    [name] [name] is a 75 y.o. male with a PMH notable for CAD having CABG with ICM/CHRONIC HFrEF-NYHA Class II  - s/p ICD (with intermittent ATP episodes) who presents today for delayed 18-monthfollow-up.  CARDIOVASCULAR HISTORY  [facility]complicated by ischemic VT postop (never had angina)  February 2014: MV CAD [facility]  relook Cath in July 2014.   Post-op CABG -> Sternal Wound Infxn --> January 2015: Redo-sternotomy with Steel-Plate insertion, (on lifelong Dicloxacillin).  After his CABG, he was lost to Cardiology F/u (by his report, the VSheridannever told him to follow-up with Cardiology).  [facility] Sx c/w Acute CHF & possible Unstable Angina ? Echo: EF ~20%, sustained VT on monitor (actually the cause for CP -->  ? CATH with patent grafts and no obvious culprit lesion-complicated by VT.. ? Status post BiVICD (CRTD) placement by Dr. TLovena Le;   [facility] [place] Valsartan -> Losartan & now back to Valsartan.  Amiodarone d/c'd due to concern for possible Pulmonary Toxicity (Interstitial Lung Dz) along with COPD.  ? PAF documented on ICD interrogation -> although never truly documented in the note and never started on DOAC  December 28, 2019--device detected 2 episodes of sustained VT--successful  conversion with ATP (patient was working out in the yard, did not recall any specific symptoms with exception of fatigue) -> no driving for 6 months  GCHRISTOPER BUSHEYwas last seen on February 22, 2020 -> he was doing well on cardiac 10 point.  He was outside gardening and felt tired a little more so than usual.  He went back inside the rest back in April when he had the sustained VT episode.  No chest pain or pressure.  Just a little more short of breath than usual.  Noted poor balance.  Takes a while to get going in the morning.  No problems with edema since alternating his Lasix 40 mg 1 day and 20 mg next day.  Mild aches and pains, leg fatigue.  Usually limited activity by leg fatigue. => No changes made.  Recent Hospitalizations: None  Reviewed  CV studies:    The following studies were reviewed today: (  if available, images/films reviewed: From Epic Chart or Care Everywhere) . Most recent ICD check stable.   Interval History:   TIEN SPOONER returns here today for follow-up for stated that he actually is doing okay.  He is determined to stay active, but can only really go for about 5 minutes without having to stop and rest for about 10.  He does try to do yard work and chores around the house.  Mostly limits him, is that his legs give out his left hip starts to hurt and then his legs start getting weak.  Its not as though he necessarily has symptoms of claudication, more so than the legs just feel weak with no strength and no balance. He does not necessarily describe any symptoms of chest tightness or pressure with rest or exertion or any significant dyspnea unless he tries to overdo things.  Edema  is well controlled with his current dosing of Lasix.  He does not take any extra doses.  He denies any sensation of any CHF or arrhythmia symptoms.  No anginal symptoms.  CV Review of Symptoms (Summary): positive for - dyspnea on exertion and Well-controlled edema.  Leg weakness and fatigue.  Mild  exertional dyspnea no change from baseline. negative for - chest pain, irregular heartbeat, orthopnea, palpitations, paroxysmal nocturnal dyspnea, rapid heart rate, shortness of breath or Lightheadedness and dizziness, syncope/near syncope or TIA/amaurosis fugax  The patient does not have symptoms concerning for COVID-19 infection (fever, chills, cough, or new shortness of breath).   REVIEWED OF SYSTEMS   Review of Systems  Constitutional: Positive for malaise/fatigue (Gets worn out easily.). Negative for weight loss.  HENT: Positive for congestion. Negative for nosebleeds.   Respiratory: Positive for shortness of breath (At baseline). Negative for cough (Occasional mild morning cough).   Cardiovascular: Negative for leg swelling (Per HPI, well controlled).  Gastrointestinal: Negative for abdominal pain, blood in stool, constipation and melena.  Genitourinary: Negative for hematuria.  Musculoskeletal: Positive for back pain and joint pain (Mostly left hip with radicular pain down the legs). Negative for falls and myalgias (Not really myalgias, just easy fatigue and weakness.).  Neurological: Positive for weakness (He says his legs feel weak when he is walking) and headaches. Negative for dizziness.  Psychiatric/Behavioral: Positive for memory loss. Negative for depression ([place]). The patient is nervous/anxious and has insomnia.    I have reviewed and (if needed) personally updated the patient's problem list, medications, allergies, past medical and surgical history, social and family history.   PAST MEDICAL HISTORY   Past Medical History:  Diagnosis Date  . Acid reflux   . AICD (automatic cardioverter/defibrillator) present    a. 08/2016 St. Jude (serial Number [id number]) biventricular ICD.  [name] Chronic systolic CHF (congestive heart failure) ([place])    a. 08/2016 Echo: EF 20%, diffuse HK, inf, post AK, mod-sev MR, sev dil LA, mod TR, PASP 79mHg.  .Marland KitchenComplication of anesthesia    "was  told I responded well to anesthesia"  . Coronary artery disease involving native coronary artery of native heart with angina pectoris ([place]    a. 10/2012 MI after aortobifem bypass, found to have multivessel CAD -->CABG x3;  b. 08/2016 Cath: LM 60, LAD 966mLCX 60ost/p, RCA 100p, VG->RPDA 50p, VG->OM1 ok, LIMA->LAD ok, EF 25%.  . Essential hypertension 03/03/2012  . History of blood transfusion 11/2012   "during his CABG"  . History of gout X 1  . History of stomach ulcers 1970s  . Hyperlipidemia associated  with type 2 diabetes mellitus ([place]) 09/16/2011  . Ischemic cardiomyopathy    a. 08/2016 Echo: EF 20%, diffuse HK, inf, post AK;  b. 08/2016 St. Jude (serial Number [id number]) biventricular ICD.  [name] PAD (peripheral artery disease) ([place])    s/p aortobifemoral bypass 10/2012  . PTSD (post-traumatic stress disorder)   . Type II diabetes mellitus ([place])   . Ventricular tachycardia ([place])    a. 08/2016 St. Jude (serial Number [id number]) biventricular ICD-->oral amio added.  . 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 Is now on chronic suppressive antibiotics with dicloxacillin    PAST SURGICAL HISTORY   Past Surgical History:  Procedure Laterality Date  . AORTO-FEMORAL BYPASS GRAFT Bilateral 10/2012   [facility]  . CARDIAC CATHETERIZATION  04/03/2013   [place] [place] (? for abnormal Nuclear ST) --> no PCI, by report, patent grafts.  [name] CARDIAC CATHETERIZATION N/A 09/02/2016   Procedure: Left Heart Cath and Cors/Grafts Angiography;  Surgeon: Burnell Blanks, MD;  [facility]:: Ost LM 60% -> mid LAD 99% subCTO, ost-prox LCx 60%; prox RCA 100% CTO w/ (small) SVG->RPDA prox 50%; SVG->OM1 ok, LIMA->mLAD ok, EF 22% - complicated by VT during access - Shock x 2, Lidocaine & Amiodarone  . CARDIAC CATHETERIZATION  11/09/2012    [facility]: due to Sustained VT post-Ob  Ao-BiFem Bypass. (no Angina)  . CHOLECYSTECTOMY  03/05/2012   Procedure: LAPAROSCOPIC CHOLECYSTECTOMY WITH INTRAOPERATIVE CHOLANGIOGRAM;  Surgeon: Rolm Bookbinder, MD;  Location: [place];  Service: General;  Laterality: N/A;  . CORONARY ARTERY BYPASS GRAFT  11/2012   [facility]: CABG X 3 -> LIMA-mLAD, SVG-highOM1, SVG-RPDA (dRCA).  . EP IMPLANTABLE DEVICE N/A 09/03/2016   Procedure: BI-VENTRICULAR IMPLANTABLE CARDIOVERTER DEFIBRILLATOR  (CRT-D);  Surgeon: Evans Lance, MD;  Location: [place] CV LAB;  Service: Cardiovascular;  Laterality: N/A;  . INGUINAL HERNIA REPAIR Left 1990s  . STERNOTOMY  09/2013   ([place]): Sternal Wound Infection -> re-do sternotomy with metal place "sheild" placed. -Has chronic staph infection, on chronic suppressive medication  . TRANSTHORACIC ECHOCARDIOGRAM  02/28/2017   EF remains 20p-25% severely reduced function. Diffuse hypokinesis. "Grade 1 diastolic dysfunction ". Severely calcified aortic valve with restricted mobility (functioning bicuspid) however no suggestion of stenosis.  [name] UMBILICAL HERNIA REPAIR  03/05/2012   Procedure: HERNIA REPAIR UMBILICAL ADULT;  Surgeon: Rolm Bookbinder, MD;  Location: [place];  Service: General;  Laterality: N/A;    Cath 08/2016.   Immunization History  Administered Date(s) Administered  . Influenza Split 09/17/2011  . Influenza-Unspecified 09/20/2018  . Moderna Sars-Covid-2 Vaccination 10/10/2019, 11/09/2019  . Pneumococcal-Unspecified 11/12/2014    MEDICATIONS/ALLERGIES   Current Meds  Medication Sig  . albuterol (VENTOLIN HFA) 108 (90 Base) MCG/ACT inhaler Inhale 1-2 puffs into the lungs every 6 (six) hours as needed for wheezing or shortness of breath.  . Alpha-Lipoic Acid 600 MG TABS Take 2 tablets by mouth 2 (two) times daily.  [name] aspirin EC 81 MG tablet Take 81 mg by mouth daily.  [name] atorvastatin (LIPITOR) 40 MG tablet Take 1 tablet (40 mg total) by mouth daily.  . carvedilol (COREG) 6.25 MG tablet Take 1  tablet (6.25 mg total) by mouth 2 (two) times daily with a meal.  . Cholecalciferol (VITAMIN D-3 PO) Take 2,000 mg by mouth daily.   . dicloxacillin (DYNAPEN) 250 MG capsule Take 500 mg by mouth 3 (three) times daily.  [name]  ferrous sulfate 324 MG TBEC Take 324 mg by mouth daily with breakfast.  . fluticasone (FLONASE) 50 MCG/ACT nasal spray Place 2 sprays into both nostrils daily as needed for allergies or rhinitis.  . furosemide (LASIX) 40 MG tablet Alternate taking 20 mg daily and 40 mg daily  . glipiZIDE (GLUCOTROL) 5 MG tablet Take 5 mg by mouth 2 (two) times daily before a meal.  . magnesium oxide (MAG-OX) 400 (241.3 Mg) MG tablet Take 1 tablet (400 mg total) by mouth daily.  . metFORMIN (GLUCOPHAGE) 1000 MG tablet Take 1 tablet (1,000 mg total) by mouth 2 (two) times daily with a meal. Resume on 09/06/2016  . nitroGLYCERIN (NITROSTAT) 0.4 MG SL tablet Place 1 tablet (0.4 mg total) under the tongue every 5 (five) minutes x 3 doses as needed for chest pain.  [name] omeprazole (PRILOSEC) 20 MG capsule Take 20 mg by mouth daily.  . Tiotropium Bromide Monohydrate (SPIRIVA RESPIMAT) 2.5 MCG/ACT AERS Inhale 2 puffs into the lungs daily.  . valsartan (DIOVAN) 40 MG tablet Take 40 mg by mouth daily.    No Known Allergies  SOCIAL HISTORY/FAMILY HISTORY   Reviewed in Epic:  Pertinent findings:  Social History   Tobacco Use  . Smoking status: Current Every Day Smoker    Types: Pipe  . Smokeless tobacco: Former Systems developer    Types: Snuff, Chew  . Tobacco comment: 09/03/2016 "used smokeless tobacco in my teens; quit smoking cigarettes in the early 1980s; smokes pipe only"  Vaping Use  . Vaping Use: Never used  Substance Use Topics  . Alcohol use: No  . Drug use: No   Social History   Social History Narrative   Routine activity around the house, but is limited more by musculoskeletal leg pain and weakness.      He enjoys smoking a pipe-long ago, he quit cigarettes    OBJCTIVE -PE, EKG, labs    Wt Readings from Last 3 Encounters:  12/29/20 141 lb (64 kg)  02/22/20 150 lb (68 kg)  01/15/20 146 lb 9.6 oz (66.5 kg)    Physical Exam: BP 126/66 (BP Location: Left Arm, Patient Position: Sitting, Cuff Size: Normal)   Pulse 70   Ht '5\' 6"'$  (1.676 m)   Wt 141 lb (64 kg)   BMI 22.76 kg/m  Physical Exam Vitals reviewed.  Constitutional:      General: He is not in acute distress.    Appearance: Normal appearance. He is normal weight. He is ill-appearing (Borderline chronically ill). He is not toxic-appearing.     Comments: Well-groomed.  Appears slightly older than stated age.  HENT:     Head: Normocephalic and atraumatic.     Mouth/Throat:     Comments: Very poor dentition Neck:     Vascular: Carotid bruit (Soft right carotid bruit versus radiated aortic valve,.) present.  Cardiovascular:     Rate and Rhythm: Normal rate and regular rhythm.     Pulses: Normal pulses.     Heart sounds: Murmur (2/6 C-D SEM at RUSB-neck) heard.  No friction rub. Gallop present.   Pulmonary:     Effort: Pulmonary effort is normal. No respiratory distress.     Breath sounds: No stridor. Wheezing present. No rhonchi or rales.     Comments: Mild diffuse interstitial sounds with expiratory wheeze.   Chest:     Chest wall: Tenderness (He also little tenderness along the metal sternal plates) present.  Musculoskeletal:        General: No swelling (  Trivial). Normal range of motion.     Cervical back: Normal range of motion and neck supple.  Skin:    General: Skin is warm and dry.     Coloration: Skin is not jaundiced or pale.  Neurological:     General: No focal deficit present.     Mental Status: He is alert and oriented to person, place, and time.     Gait: Gait abnormal (Somewhat slow, borderline staggered).  Psychiatric:        Mood and Affect: Mood normal.        Behavior: Behavior normal.        Thought Content: Thought content normal.        Judgment: Judgment normal.     Comments:  Maybe little slow answering questions, but stable.     Adult ECG report Not checked  Recent Labs:  From [place].  CBC: 11/06/2020  W 11.1, H/H 11.1/35.8, Plt 376; 12/11/2020  W 8.7, H/H 11.6/37, Plt 77.7 -> TIBC 304, Fe 33,  Fe Sat 9.1 L (has been started on Fe supplement) CMP ;11/06/2020: Na+ 133, K+ 4.8, BUN 9, Cr 0.8, Glu 144, Ca2+ 8.8 LIPID PANEL  12/12/2019 TC 122, TG 151, HDL 30, LDL 70;;11/06/2020 TC 123, TG 116, HDL 30, LDL 70;    Lab Results  Component Value Date   TSH 6.87 (H) 09/08/2016    ==================================================  COVID-19 Education: The signs and symptoms of COVID-19 were discussed with the patient and how to seek care for testing (follow up with PCP or arrange E-visit).   The importance of social distancing and COVID-19 vaccination was discussed today. The patient is practicing social distancing & Masking.   I spent a total of 42 minutes with the patient spent in direct patient consultation.  Additional time spent with chart review  / charting (studies, outside notes, etc): 16 min Total Time: 58 min  Current medicines are reviewed at length with the patient today.  (+/- concerns) n/a  This visit occurred during the SARS-CoV-2 public health emergency.  Safety protocols were in place, including screening questions prior to the visit, additional usage of staff PPE, and extensive cleaning of exam room while observing appropriate contact time as indicated for disinfecting solutions.  Notice: This dictation was prepared with Dragon dictation along with smaller phrase technology. Any transcriptional errors that result from this process are unintentional and may not be corrected upon review.  Patient Instructions / Medication Changes & Studies & Tests Ordered   Patient Instructions  Medication Instructions:  No changes.   *If you need a refill on your cardiac medications before your next appointment, please call your pharmacy*   Lab Work: None  ordered If you have labs (blood work) drawn today and your tests are completely normal, you will receive your results only by: [name] MyChart Message (if you have MyChart) OR . A paper copy in the mail If you have any lab test that is abnormal or we need to change your treatment, we will call you to review the results.   Testing/Procedures:  To be scheduled at [address]. Your physician has requested that you have an echocardiogram. Echocardiography is a painless test that uses sound waves to create images of your heart. It provides your doctor with information about the size and shape of your heart and how well your heart's chambers and valves are working. This procedure takes approximately one hour. There are no restrictions for this procedure.  To be  scheduled at [place], [place] has requested that you have an ankle brachial index (ABI). During this test an ultrasound and blood pressure cuff are used to evaluate the arteries that supply the arms and legs with blood. Allow thirty minutes for this exam. There are no restrictions or special instructions.  AND  Your physician has requested that you have an aortic/vena cava/iliac doppler. During this test, an ultrasound is used to evaluate the aorta/iliac. Allow 30 minutes for this exam. Do not eat after midnight the day before and avoid carbonated beverages    Follow-Up: At [facility], you and your health needs are our priority.  As part of our continuing mission to provide you with exceptional heart care, we have created designated Provider Care Teams.  These Care Teams include your primary Cardiologist (physician) and Advanced Practice Providers (APPs -  Physician Assistants and Nurse Practitioners) who all work together to provide you with the care you need, when you need it.  We recommend signing up for the patient portal called "MyChart".  Sign up information is provided on this After  Visit Summary.  MyChart is used to connect with patients for Virtual Visits (Telemedicine).  Patients are able to view lab/test results, encounter notes, upcoming appointments, etc.  Non-urgent messages can be sent to your provider as well.   To learn more about what you can do with MyChart, go to [email or web address].    Your next appointment:   8 month(s)  The format for your next appointment:   In Person  Provider:   Glenetta Hew, MD        Studies Ordered:   Orders Placed This Encounter  Procedures  . ECHOCARDIOGRAM COMPLETE  . VAS [place] ABI WITH/WO TBI  . VAS US AORTA/IVC/ILIACS     Glenetta Hew, M.D., M.S. Interventional Cardiologist   Pager # [phone] Phone # [phone] [address] [place], [place] [address]   Thank you for choosing [facility] at [facility]!!

## 2020-12-29 NOTE — Patient Instructions (Addendum)
Medication Instructions:  No changes.   *If you need a refill on your cardiac medications before your next appointment, please call your pharmacy*   Lab Work: None ordered If you have labs (blood work) drawn today and your tests are completely normal, you will receive your results only by: [name] MyChart Message (if you have MyChart) OR . A paper copy in the mail If you have any lab test that is abnormal or we need to change your treatment, we will call you to review the results.   Testing/Procedures:  To be scheduled at [address]. Your physician has requested that you have an echocardiogram. Echocardiography is a painless test that uses sound waves to create images of your heart. It provides your doctor with information about the size and shape of your heart and how well your heart's chambers and valves are working. This procedure takes approximately one hour. There are no restrictions for this procedure.  To be scheduled at [address] [facility], [address] Your physician has requested that you have an ankle brachial index (ABI). During this test an ultrasound and blood pressure cuff are used to evaluate the arteries that supply the arms and legs with blood. Allow thirty minutes for this exam. There are no restrictions or special instructions.  AND  Your physician has requested that you have an aortic/vena cava/iliac doppler. During this test, an ultrasound is used to evaluate the aorta/iliac. Allow 30 minutes for this exam. Do not eat after midnight the day before and avoid carbonated beverages    Follow-Up: At [facility], you and your health needs are our priority.  As part of our continuing mission to provide you with exceptional heart care, we have created designated Provider Care Teams.  These Care Teams include your primary Cardiologist (physician) and Advanced Practice Providers (APPs -  Physician Assistants and Nurse Practitioners) who all work together to  provide you with the care you need, when you need it.  We recommend signing up for the patient portal called "MyChart".  Sign up information is provided on this After Visit Summary.  MyChart is used to connect with patients for Virtual Visits (Telemedicine).  Patients are able to view lab/test results, encounter notes, upcoming appointments, etc.  Non-urgent messages can be sent to your provider as well.   To learn more about what you can do with MyChart, go to [email or web address].    Your next appointment:   8 month(s)  The format for your next appointment:   In Person  Provider:   Bryan Lemma, MD

## 2020-12-30 ENCOUNTER — Encounter: Payer: Self-pay | Admitting: Cardiology

## 2020-12-30 NOTE — Assessment & Plan Note (Signed)
As far can tell, no documentation of A. fib since he has had his ICD placed.  There is a thought of possible A. fib, but has not been seen.  Therefore not DOAC.

## 2020-12-30 NOTE — Assessment & Plan Note (Signed)
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  Has not had recent echo.  Plan: Check an echo to reassess EF is new baseline.

## 2020-12-30 NOTE — Assessment & Plan Note (Addendum)
Intermittent episodes, he does not recall any recent ATP episodes.  Relatively asymptomatic. No longer on amiodarone.  Remains on carvedilol. 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

## 2020-12-30 NOTE — Assessment & Plan Note (Signed)
Due for follow-up AAA Doppler and ABIs.

## 2020-12-30 NOTE — Assessment & Plan Note (Addendum)
He has not had anginal symptoms in multiple years.  Has not required any nitroglycerin although he has prescription for it.  He has not had ischemic evaluation in quite some time, per previous discussion, we has been holding off on doing evaluations unless he actually has true symptoms.  Plan: Continue carvedilol and valsartan -> Discuss stress testing at follow-up visit.

## 2020-12-30 NOTE — Assessment & Plan Note (Signed)
To colectomy ischemic evaluation.  This can be done prior to follow-up next year.

## 2020-12-30 NOTE — Assessment & Plan Note (Signed)
Due for follow-up Dopplers to be checked.  We will check AAA Doppler as well as ABIs.

## 2020-12-30 NOTE — Assessment & Plan Note (Signed)
Has not had any further episodes since ICD placement.  Cardiac catheter stable coronaries with patent grafts. Maintained on carvedilol, losartan (based on [place] pharmacy).  On multiple high-dose atorvastatin, tolerating well. On aspirin alone

## 2020-12-30 NOTE — Assessment & Plan Note (Signed)
Stable blood pressure on current dose of beta-blocker and ARB.  Has had some orthostatic issues in the past to be more aggressive.

## 2020-12-30 NOTE — Assessment & Plan Note (Addendum)
Chronic HFrEF with reduced EF.  NYHA class I-II symptoms at worst.  Edema well controlled with current dose of Lasix, azelastine alternates high in low doses.  We did discuss sliding scale.  He has not required any additional dosing. He is on carvedilol and unfortunately only on losartan with adequate blood pressure control.  (Despite efforts-we are not able to get him to start on Entresto, but finally converted from losartan to valsartan.  [facility] not [facility].  If [facility] were to adopt guideline directed therapy, we can start him on Entresto.),  For similar reasons as noted with Sherryll Burger, he is also not SGLT2 inhibitor.  He is on Metformin and glipizide. => Would ask that his PCP for the [facility] consider SGLT2 inhibitor.   Plan: Continue current meds

## 2020-12-30 NOTE — Assessment & Plan Note (Signed)
Status post aortobifem bypass in 2014.  He is noticing leg fatigue with walking.  He is overdue for aortobifem Dopplers and ABIs.  Symptoms are not necessarily consistent with claudication, need to check.  Continue cardiovascular risk factor reduction medications.

## 2020-12-30 NOTE — Assessment & Plan Note (Signed)
Lipids well controlled per PCP notes.  Continue current dose of statin.

## 2021-01-09 ENCOUNTER — Ambulatory Visit (HOSPITAL_COMMUNITY)
Admission: RE | Admit: 2021-01-09 | Discharge: 2021-01-09 | Disposition: A | Discharge status: Home / Self Care | Payer: Medicare Other | Source: Ambulatory Visit | Attending: Cardiovascular Disease | Admitting: Cardiovascular Disease

## 2021-01-09 ENCOUNTER — Ambulatory Visit (HOSPITAL_BASED_OUTPATIENT_CLINIC_OR_DEPARTMENT_OTHER)
Admission: RE | Admit: 2021-01-09 | Discharge: 2021-01-09 | Disposition: A | Discharge status: Home / Self Care | Payer: Medicare Other | Source: Ambulatory Visit | Attending: Cardiology | Admitting: Cardiology

## 2021-01-09 ENCOUNTER — Other Ambulatory Visit: Payer: Self-pay

## 2021-01-09 DIAGNOSIS — I10 Essential (primary) hypertension: Secondary | ICD-10-CM | POA: Insufficient documentation

## 2021-01-09 DIAGNOSIS — I739 Peripheral vascular disease, unspecified: Secondary | ICD-10-CM | POA: Insufficient documentation

## 2021-01-09 DIAGNOSIS — Z95828 Presence of other vascular implants and grafts: Secondary | ICD-10-CM | POA: Diagnosis not present

## 2021-01-09 DIAGNOSIS — Z8679 Personal history of other diseases of the circulatory system: Secondary | ICD-10-CM

## 2021-01-09 DIAGNOSIS — Z9889 Other specified postprocedural states: Secondary | ICD-10-CM | POA: Diagnosis not present

## 2021-01-09 DIAGNOSIS — I2510 Atherosclerotic heart disease of native coronary artery without angina pectoris: Secondary | ICD-10-CM

## 2021-01-18 HISTORY — PX: TRANSTHORACIC ECHOCARDIOGRAM: SHX275

## 2021-01-22 ENCOUNTER — Ambulatory Visit (INDEPENDENT_AMBULATORY_CARE_PROVIDER_SITE_OTHER): Payer: Medicare Other

## 2021-01-22 DIAGNOSIS — I255 Ischemic cardiomyopathy: Secondary | ICD-10-CM

## 2021-01-22 DIAGNOSIS — Z95810 Presence of automatic (implantable) cardiac defibrillator: Secondary | ICD-10-CM | POA: Diagnosis not present

## 2021-01-23 NOTE — Progress Notes (Signed)
EPIC Encounter for ICM Monitoring  Patient Name: [name] [name] is a 75 y.o. male Date: 01/23/2021 Primary Care Physican: Dellia Cloud, MD Primary Cardiologist: Herbie Baltimore  Electrophysiologist: Rae Roam Pacing:96% 4/11/2022Weight:141lbs   Transmission reviewed.    Corvuethoracic impedancesuggestingnormal fluid levels.  Prescribed:  Furosemide 40 mgtablets -Alternate taking 20 mg daily and 40 mg daily  Aspirin 81 mg daily  Labs: 12/12/2019 Creatinine 0.9, BUN 6, Potassium 4.5, Sodium 133, GFR 88.2 A complete set of results can be found in Results Review.  Recommendations: No changes.  Follow-up plan: ICM clinic phone appointment on6/13/2022. 91 day device clinic remote transmission5/31/2022.   EP/Cardiology Office Visits: 5/11/2022with Dr. Herbie Baltimore. 02/05/2021 with Dr Ladona Ridgel.   Copy of ICM check sent to Dr.Taylor   3 month ICM trend: 01/22/2021.    1 Year ICM trend:       Karie Soda, RN 01/23/2021 3:08 PM

## 2021-01-28 ENCOUNTER — Ambulatory Visit (INDEPENDENT_AMBULATORY_CARE_PROVIDER_SITE_OTHER): Payer: Medicare Other | Admitting: Cardiovascular Disease

## 2021-01-28 ENCOUNTER — Other Ambulatory Visit: Payer: Self-pay

## 2021-01-28 ENCOUNTER — Encounter: Payer: Self-pay | Admitting: Cardiovascular Disease

## 2021-01-28 VITALS — BP 114/62 | HR 62 | Ht 66.0 in | Wt 140.0 lb

## 2021-01-28 DIAGNOSIS — Z951 Presence of aortocoronary bypass graft: Secondary | ICD-10-CM | POA: Diagnosis not present

## 2021-01-28 DIAGNOSIS — Z9889 Other specified postprocedural states: Secondary | ICD-10-CM

## 2021-01-28 DIAGNOSIS — Z8679 Personal history of other diseases of the circulatory system: Secondary | ICD-10-CM | POA: Diagnosis not present

## 2021-01-28 DIAGNOSIS — I739 Peripheral vascular disease, unspecified: Secondary | ICD-10-CM

## 2021-01-28 NOTE — Progress Notes (Signed)
01/28/2021 [name] [name]   December 26, 1945  [id number]  Primary Physician [name] Cloud, MD Primary Cardiologist: [name] Gess MD [name] [name], [place]  HPI:  [name] [name] is a 75 y.o. thin appearing married Caucasian male father of 1, grandfather 2 grandchildren who is accompanied by his daughter [name] [name] today.  He is referred by Dr. Herbie [name], his cardiologist for evaluation of symptomatic PAD.  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  History factors include continued tobacco abuse smoking a pipe as well history of hypertension, diabetes and hyperlipidemia.  He had aortobifemoral bypass grafting at the [facility] February 2014 and coronary artery bypass grafting x3 a month later.  He has ischemic cardiomyopathy and has an ICD implanted followed by Dr. Ladona [name].  He is complaining of left lower extremity claudication for the last 1 to 2 years.  Recent Doppler studies performed 01/09/2021 revealed right ABI 1.0 and a left of 0.93.  He had patent right and left limb of his bypass graft with a high-frequency signal of the origin of his left SFA.   Current Meds  Medication Sig  . albuterol (VENTOLIN HFA) 108 (90 Base) MCG/ACT inhaler Inhale 1-2 puffs into the lungs every 6 (six) hours as needed for wheezing or shortness of breath.  . Alpha-Lipoic Acid 600 MG TABS Take 2 tablets by mouth 2 (two) times daily.  [name] aspirin EC 81 MG tablet Take 81 mg by mouth daily.  [name] atorvastatin (LIPITOR) 40 MG tablet Take 1 tablet (40 mg total) by mouth daily.  . carvedilol (COREG) 6.25 MG tablet Take 1 tablet (6.25 mg total) by mouth 2 (two) times daily with a meal.  . Cholecalciferol (VITAMIN D-3 PO) Take 2,000 mg by mouth daily.   . dicloxacillin (DYNAPEN) 250 MG capsule Take 500 mg by mouth 3 (three) times daily.  . ferrous sulfate 324 MG TBEC Take 324 mg by mouth daily with breakfast.  . fluticasone (FLONASE) 50 MCG/ACT nasal spray Place 2 sprays  into both nostrils daily as needed for allergies or rhinitis.  . furosemide (LASIX) 40 MG tablet Alternate taking 20 mg daily and 40 mg daily  . glipiZIDE (GLUCOTROL) 5 MG tablet Take 5 mg by mouth 2 (two) times daily before a meal.  . magnesium oxide (MAG-OX) 400 (241.3 Mg) MG tablet Take 1 tablet (400 mg total) by mouth daily.  . metFORMIN (GLUCOPHAGE) 1000 MG tablet Take 1 tablet (1,000 mg total) by mouth 2 (two) times daily with a meal. Resume on 09/06/2016  . nitroGLYCERIN (NITROSTAT) 0.4 MG SL tablet Place 1 tablet (0.4 mg total) under the tongue every 5 (five) minutes x 3 doses as needed for chest pain.  [name] omeprazole (PRILOSEC) 20 MG capsule Take 20 mg by mouth daily.  . Tiotropium Bromide Monohydrate (SPIRIVA RESPIMAT) 2.5 MCG/ACT AERS Inhale 2 puffs into the lungs daily.  . valsartan (DIOVAN) 40 MG tablet Take 40 mg by mouth daily.     No Known Allergies  Social History   Socioeconomic History  . Marital status: Married    Spouse name: Not on file  . Number of children: Not on file  . Years of education: Not on file  . Highest education level: Not on file  Occupational History  . Not on file  Tobacco Use  . Smoking status: Current Every Day Smoker    Types: Pipe  . Smokeless tobacco: Former Neurosurgeon    Types: Snuff,  Chew  . Tobacco comment: 09/03/2016 "used smokeless tobacco in my teens; quit smoking cigarettes in the early 1980s; smokes pipe only"  Vaping Use  . Vaping Use: Never used  Substance and Sexual Activity  . Alcohol use: No  . Drug use: No  . Sexual activity: Not on file  Other Topics Concern  . Not on file  Social History Narrative   Routine activity around the house, but is limited more by musculoskeletal leg pain and weakness.      He enjoys smoking a pipe-long ago, he quit cigarettes   Social Determinants of Health   Financial Resource Strain: Not on file  Food Insecurity: Not on file  Transportation Needs: Not on file  Physical Activity: Not on file   Stress: Not on file  Social Connections: Not on file  Intimate Partner Violence: Not on file     Review of Systems: General: negative for chills, fever, night sweats or weight changes.  Cardiovascular: negative for chest pain, dyspnea on exertion, edema, orthopnea, palpitations, paroxysmal nocturnal dyspnea or shortness of breath Dermatological: negative for rash Respiratory: negative for cough or wheezing Urologic: negative for hematuria Abdominal: negative for nausea, vomiting, diarrhea, bright red blood per rectum, melena, or hematemesis Neurologic: negative for visual changes, syncope, or dizziness All other systems reviewed and are otherwise negative except as noted above.    Blood pressure 114/62, pulse 62, height 5\' 6"  (1.676 m), weight 140 lb (63.5 kg).  General appearance: alert and no distress Neck: no adenopathy, no carotid bruit, no JVD, supple, symmetrical, trachea midline and thyroid not enlarged, symmetric, no tenderness/mass/nodules Lungs: clear to auscultation bilaterally Heart: regular rate and rhythm, S1, S2 normal, no murmur, click, rub or gallop Extremities: extremities normal, atraumatic, no cyanosis or edema Pulses: 2+ and symmetric Skin: Skin color, texture, turgor normal. No rashes or lesions Neurologic: Alert and oriented X 3, normal strength and tone. Normal symmetric reflexes. Normal coordination and gait  EKG atrial sensed, ventricular paced rhythm at 62.  I personally reviewed this EKG.  ASSESSMENT AND PLAN:   S/P AAA repair [name] [name] was referred to me by Dr. Kathlene November for symptomatic left lower extremity claudication.  He had aortobifemoral bypass grafting at the [facility] in February 2014.  He has had left lower extremity claudication for the last 1 to 2 years.  Recent Doppler studies performed 01/09/2021 revealed a right ABI of 1 and a left of 0.93.  His aortobifemoral bypass graft is patent although he does have a high-frequency signal  of the origin of his left SFA.  I am going to get a CT angiogram to further evaluate his anatomy.  He may be a candidate for either local endarterectomy and patch angioplasty versus endovascular therapy through the left brachial approach.      01/11/2021 MD FACP,FACC,FAHA, [facility] 01/28/2021 3:34 PM

## 2021-01-28 NOTE — Assessment & Plan Note (Signed)
[name] [name] was referred to me by Dr. Herbie Baltimore for symptomatic left lower extremity claudication.  He had aortobifemoral bypass grafting at the [facility] in February 2014.  He has had left lower extremity claudication for the last 1 to 2 years.  Recent Doppler studies performed 01/09/2021 revealed a right ABI of 1 and a left of 0.93.  His aortobifemoral bypass graft is patent although he does have a high-frequency signal of the origin of his left SFA.  I am going to get a CT angiogram to further evaluate his anatomy.  He may be a candidate for either local endarterectomy and patch angioplasty versus endovascular therapy through the left brachial approach.

## 2021-01-28 NOTE — Patient Instructions (Signed)
Medication Instructions:  Your physician recommends that you continue on your current medications as directed. Please refer to the Current Medication list given to you today.  *If you need a refill on your cardiac medications before your next appointment, please call your pharmacy*  Follow-Up: At [facility], you and your health needs are our priority.  As part of our continuing mission to provide you with exceptional heart care, we have created designated Provider Care Teams.  These Care Teams include your primary Cardiologist (physician) and Advanced Practice Providers (APPs -  Physician Assistants and Nurse Practitioners) who all work together to provide you with the care you need, when you need it.  We recommend signing up for the patient portal called "MyChart".  Sign up information is provided on this After Visit Summary.  MyChart is used to connect with patients for Virtual Visits (Telemedicine).  Patients are able to view lab/test results, encounter notes, upcoming appointments, etc.  Non-urgent messages can be sent to your provider as well.   To learn more about what you can do with MyChart, go to [email or web address].    Your next appointment:   6 week(s)  The format for your next appointment:   In Person  Provider:   Jonathan Berry, MD 

## 2021-02-05 ENCOUNTER — Encounter: Payer: Self-pay | Admitting: Internal Medicine

## 2021-02-05 ENCOUNTER — Ambulatory Visit (INDEPENDENT_AMBULATORY_CARE_PROVIDER_SITE_OTHER): Payer: Medicare Other | Admitting: Internal Medicine

## 2021-02-05 ENCOUNTER — Ambulatory Visit (HOSPITAL_COMMUNITY): Payer: Medicare Other | Attending: Cardiology

## 2021-02-05 ENCOUNTER — Other Ambulatory Visit: Payer: Self-pay

## 2021-02-05 VITALS — BP 126/64 | HR 72 | Ht 66.0 in | Wt 140.0 lb

## 2021-02-05 DIAGNOSIS — I5022 Chronic systolic (congestive) heart failure: Secondary | ICD-10-CM

## 2021-02-05 DIAGNOSIS — I2510 Atherosclerotic heart disease of native coronary artery without angina pectoris: Secondary | ICD-10-CM | POA: Insufficient documentation

## 2021-02-05 DIAGNOSIS — I472 Ventricular tachycardia, unspecified: Secondary | ICD-10-CM

## 2021-02-05 DIAGNOSIS — I5020 Unspecified systolic (congestive) heart failure: Secondary | ICD-10-CM | POA: Insufficient documentation

## 2021-02-05 DIAGNOSIS — I420 Dilated cardiomyopathy: Secondary | ICD-10-CM | POA: Insufficient documentation

## 2021-02-05 DIAGNOSIS — I480 Paroxysmal atrial fibrillation: Secondary | ICD-10-CM

## 2021-02-05 DIAGNOSIS — I200 Unstable angina: Secondary | ICD-10-CM

## 2021-02-05 DIAGNOSIS — Z95810 Presence of automatic (implantable) cardiac defibrillator: Secondary | ICD-10-CM | POA: Diagnosis not present

## 2021-02-05 LAB — ECHOCARDIOGRAM COMPLETE
AR max vel: 0.95 cm2
AV Area VTI: 1.14 cm2
AV Area mean vel: 0.91 cm2
AV Mean grad: 6 mmHg
AV Peak grad: 10.7 mmHg
Ao pk vel: 1.64 m/s
Area-P 1/2: 2.69 cm2
MV M vel: 5.14 m/s
MV Peak grad: 105.7 mmHg
P 1/2 time: 660 msec
S' Lateral: 5.2 cm

## 2021-02-05 NOTE — Patient Instructions (Signed)
Medication Instructions:  Your physician recommends that you continue on your current medications as directed. Please refer to the Current Medication list given to you today.  Labwork: None ordered.  Testing/Procedures: None ordered.  Follow-Up: Your physician wants you to follow-up in: one year with Lewayne Bunting, MD or one of the following Advanced Practice Providers on your designated Care Team:    Gypsy Balsam, NP  Francis Dowse, PA-C  Casimiro Needle "Mardelle Matte" [place], [place]  Remote monitoring is used to monitor your ICD from home. This monitoring reduces the number of office visits required to check your device to one time per year. It allows [place] to keep an eye on the functioning of your device to ensure it is working properly. You are scheduled for a device check from home on 02/17/2021. You may send your transmission at any time that day. If you have a wireless device, the transmission will be sent automatically. After your physician reviews your transmission, you will receive a postcard with your next transmission date.  Any Other Special Instructions Will Be Listed Below (If Applicable).  If you need a refill on your cardiac medications before your next appointment, please call your pharmacy.

## 2021-02-05 NOTE — Progress Notes (Signed)
HPI [name] returns today for followup. He has a h/o PAF, chronic systolic heart failure, s/p ICD insertion, PAD, and COPD. He remains active raising a garden. He had developed worsening dyspnea and PFT's and his amiodarone was stopped. He has done well in the interim with no symptomatic atrial fib. He gets tired if he works too long at his garden. He is planting fewer tomatoes than he has in the past as he gets tired.  No Known Allergies   Current Outpatient Medications  Medication Sig Dispense Refill  . albuterol (VENTOLIN HFA) 108 (90 Base) MCG/ACT inhaler Inhale 1-2 puffs into the lungs every 6 (six) hours as needed for wheezing or shortness of breath.    . Alpha-Lipoic Acid 600 MG TABS Take 2 tablets by mouth 2 (two) times daily.    [name] aspirin EC 81 MG tablet Take 81 mg by mouth daily.    [name] atorvastatin (LIPITOR) 80 MG tablet Take 0.5 tablets by mouth daily.    . carvedilol (COREG) 6.25 MG tablet Take 1 tablet (6.25 mg total) by mouth 2 (two) times daily with a meal. 180 tablet 2  . Cholecalciferol (VITAMIN D-3 PO) Take 2,000 mg by mouth daily.     . dicloxacillin (DYNAPEN) 250 MG capsule Take 500 mg by mouth 3 (three) times daily.    . ferrous sulfate 324 MG TBEC Take 324 mg by mouth daily with breakfast.    . fluticasone (FLONASE) 50 MCG/ACT nasal spray Place 2 sprays into both nostrils daily as needed for allergies or rhinitis.    . furosemide (LASIX) 40 MG tablet Alternate taking 20 mg daily and 40 mg daily 30 tablet 6  . glipiZIDE (GLUCOTROL) 5 MG tablet Take 5 mg by mouth 2 (two) times daily before a meal.    . magnesium oxide (MAG-OX) 400 (241.3 Mg) MG tablet Take 1 tablet (400 mg total) by mouth daily. 30 tablet 6  . metFORMIN (GLUCOPHAGE) 1000 MG tablet Take 1 tablet (1,000 mg total) by mouth 2 (two) times daily with a meal. Resume on 09/06/2016    . nitroGLYCERIN (NITROSTAT) 0.4 MG SL tablet Place 1 tablet (0.4 mg total) under the tongue every 5 (five) minutes x 3 doses  as needed for chest pain. 25 tablet 3  . omeprazole (PRILOSEC) 20 MG capsule Take 20 mg by mouth daily.    . Tiotropium Bromide Monohydrate (SPIRIVA RESPIMAT) 2.5 MCG/ACT AERS Inhale 2 puffs into the lungs daily.    . valsartan (DIOVAN) 40 MG tablet Take 40 mg by mouth daily.     No current facility-administered medications for this visit.     Past Medical History:  Diagnosis Date  . Acid reflux   . AICD (automatic cardioverter/defibrillator) present    a. 08/2016 St. Jude (serial Number [id number]) biventricular ICD.  [name] Chronic systolic CHF (congestive heart failure) (HCC)    a. 08/2016 Echo: EF 20%, diffuse HK, inf, post AK, mod-sev MR, sev dil LA, mod TR, PASP .  [name] Complication of anesthesia    "was told I responded well to anesthesia"  . Coronary artery disease involving native coronary artery of native heart with angina pectoris (HCC)    a. 10/2012 MI after aortobifem bypass, found to have multivessel CAD -->CABG x3;  b. 08/2016 Cath: LM 60, LAD 47m, LCX 60ost/p, RCA 100p, VG->RPDA 50p, VG->OM1 ok, LIMA->LAD ok, EF 25%.  . Essential hypertension 03/03/2012  . History of blood transfusion 11/2012   "during his CABG"  .  History of gout X 1  . History of stomach ulcers 1970s  . Hyperlipidemia associated with type 2 diabetes mellitus (HCC) 09/16/2011  . Ischemic cardiomyopathy    a. 08/2016 Echo: EF 20%, diffuse HK, inf, post AK;  b. 08/2016 St. Jude (serial Number [id number]) biventricular ICD.  [name] PAD (peripheral artery disease) (HCC)    s/p aortobifemoral bypass 10/2012  . PTSD (post-traumatic stress disorder)   . Type II diabetes mellitus (HCC)   . Ventricular tachycardia (HCC)    a. 08/2016 St. Jude (serial Number [id number]) biventricular ICD-->oral amio added.  . 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 Is now on chronic suppressive antibiotics with  dicloxacillin    ROS:   All systems reviewed and negative except as noted in the HPI.   Past Surgical History:  Procedure Laterality Date  . AORTO-FEMORAL BYPASS GRAFT Bilateral 10/2012   [facility]  . CARDIAC CATHETERIZATION  04/03/2013   [facility] [place] (? for abnormal Nuclear ST) --> no PCI, by report, patent grafts.  [name] CARDIAC CATHETERIZATION N/A 09/02/2016   Procedure: Left Heart Cath and Cors/Grafts Angiography;  Surgeon: Kathleene Hazel, MD;  Location: [facility] INVASIVE CV LAB:: Ost LM 60% -> mid LAD 99% subCTO, ost-prox LCx 60%; prox RCA 100% CTO w/ (small) SVG->RPDA prox 50%; SVG->OM1 ok, LIMA->mLAD ok, EF 25% - complicated by VT during access - Shock x 2, Lidocaine & Amiodarone  . CARDIAC CATHETERIZATION  11/09/2012   [facility]: due to Sustained VT post-Ob Ao-BiFem Bypass. (no Angina)  . CHOLECYSTECTOMY  03/05/2012   Procedure: LAPAROSCOPIC CHOLECYSTECTOMY WITH INTRAOPERATIVE CHOLANGIOGRAM;  Surgeon: Emelia Loron, MD;  Location: [facility] OR;  Service: General;  Laterality: N/A;  . CORONARY ARTERY BYPASS GRAFT  11/2012   [facility]: CABG X 3 -> LIMA-mLAD, SVG-highOM1, SVG-RPDA (dRCA).  . EP IMPLANTABLE DEVICE N/A 09/03/2016   Procedure: BI-VENTRICULAR IMPLANTABLE CARDIOVERTER DEFIBRILLATOR  (CRT-D);  Surgeon: Marinus Maw, MD;  Location: [facility] INVASIVE CV LAB;  Service: Cardiovascular;  Laterality: N/A;  . INGUINAL HERNIA REPAIR Left 1990s  . STERNOTOMY  09/2013   [facility] [place]): Sternal Wound Infection -> re-do sternotomy with metal place "sheild" placed. -Has chronic staph infection, on chronic suppressive medication  . TRANSTHORACIC ECHOCARDIOGRAM  02/28/2017   EF remains 20p-25% severely reduced function. Diffuse hypokinesis. "Grade 1 diastolic dysfunction ". Severely calcified aortic valve with restricted mobility (functioning bicuspid) however no suggestion of stenosis.  [name] UMBILICAL HERNIA REPAIR  03/05/2012   Procedure: HERNIA REPAIR UMBILICAL ADULT;  Surgeon: Emelia Loron, MD;   Location: [facility] OR;  Service: General;  Laterality: N/A;     Family History  Problem Relation Age of Onset  . Cancer Brother        esophageal     Social History   Socioeconomic History  . Marital status: Married    Spouse name: Not on file  . Number of children: Not on file  . Years of education: Not on file  . Highest education level: Not on file  Occupational History  . Not on file  Tobacco Use  . Smoking status: Current Every Day Smoker    Types: Pipe  . Smokeless tobacco: Former Neurosurgeon    Types: Snuff, Chew  . Tobacco comment: 09/03/2016 "used smokeless tobacco in my teens; quit smoking cigarettes in the early 1980s; smokes pipe only"  Vaping Use  . Vaping Use: Never used  Substance and Sexual  Activity  . Alcohol use: No  . Drug use: No  . Sexual activity: Not on file  Other Topics Concern  . Not on file  Social History Narrative   Routine activity around the house, but is limited more by musculoskeletal leg pain and weakness.      He enjoys smoking a pipe-long ago, he quit cigarettes   Social Determinants of Health   Financial Resource Strain: Not on file  Food Insecurity: Not on file  Transportation Needs: Not on file  Physical Activity: Not on file  Stress: Not on file  Social Connections: Not on file  Intimate Partner Violence: Not on file     BP 126/64   Pulse 72   Ht 5\' 6"  (1.676 m)   Wt 140 lb (63.5 kg)   SpO2 95%   BMI 22.60 kg/m   Physical Exam:  Well appearing NAD HEENT: Unremarkable Neck:  No JVD, no thyromegally Lymphatics:  No adenopathy Back:  No CVA tenderness Lungs:  Clear with no wheezes HEART:  Regular rate rhythm, no murmurs, no rubs, no clicks Abd:  soft, positive bowel sounds, no organomegally, no rebound, no guarding Ext:  2 plus pulses, no edema, no cyanosis, no clubbing Skin:  No rashes no nodules Neuro:  CN II through XII intact, motor grossly intact   DEVICE  Normal device function.  See PaceArt for details.    Assess/Plan: 1. Chronic systolic heart failure - his symptoms are class 2. He will continue his current meds. 2. PAF - he is maintaining NSR despite stopping his amiodarone 3. ICD - his St. Jude Biv ICD is working normally. He has about 3 years of battery longevity. 4. CAD  - he denies anginal symptoms. He is encouraged to remain active. We will follow.  Chane Cowden,MD

## 2021-03-02 ENCOUNTER — Ambulatory Visit (INDEPENDENT_AMBULATORY_CARE_PROVIDER_SITE_OTHER): Payer: Medicare Other

## 2021-03-02 DIAGNOSIS — Z95810 Presence of automatic (implantable) cardiac defibrillator: Secondary | ICD-10-CM | POA: Diagnosis not present

## 2021-03-02 DIAGNOSIS — I5022 Chronic systolic (congestive) heart failure: Secondary | ICD-10-CM

## 2021-03-02 NOTE — Progress Notes (Signed)
EPIC Encounter for ICM Monitoring  Patient Name: [name] [name] is a 75 y.o. male Date: 03/02/2021 Primary Care Physican: Dellia Cloud, MD Primary Cardiologist: Herbie Baltimore Electrophysiologist: Rae Roam Pacing: 96%     03/02/2021 Weight: 142 lbs     Spoke with patient and reports pt is feeling well at this time.  Denies fluid symptoms.     Corvue thoracic impedance suggesting possible fluid accumulation starting 03/01/2021.     Prescribed:  Furosemide 40 mg tablets - Alternate taking 20 mg daily and 40 mg daily Aspirin 81 mg daily   Labs: 12/12/2019 Creatinine 0.9, BUN 6, Potassium 4.5, Sodium 133, GFR 88.2 A complete set of results can be found in Results Review.   Recommendations:  Advised to take Furosemide 40 mg x 3 days and then return to alternating dosage 40/20.     Follow-up plan: ICM clinic phone appointment on 04/06/2021.   91 day device clinic remote transmission 05/19/2021.     EP/Cardiology Office Visits:   03/11/2021 with Dr. Allyson Sabal.     Copy of ICM check sent to Dr. Ladona Ridgel.   3 month ICM trend: 03/02/2021.    1 Year ICM trend:       Karie Soda, RN 03/02/2021 4:28 PM

## 2021-03-11 ENCOUNTER — Encounter: Payer: Self-pay | Admitting: Cardiovascular Disease

## 2021-03-11 ENCOUNTER — Other Ambulatory Visit: Payer: Self-pay

## 2021-03-11 ENCOUNTER — Ambulatory Visit (INDEPENDENT_AMBULATORY_CARE_PROVIDER_SITE_OTHER): Payer: Medicare Other | Admitting: Cardiovascular Disease

## 2021-03-11 VITALS — BP 136/64 | HR 68 | Ht 66.0 in | Wt 143.4 lb

## 2021-03-11 DIAGNOSIS — I739 Peripheral vascular disease, unspecified: Secondary | ICD-10-CM

## 2021-03-11 DIAGNOSIS — Z9889 Other specified postprocedural states: Secondary | ICD-10-CM

## 2021-03-11 DIAGNOSIS — Z8679 Personal history of other diseases of the circulatory system: Secondary | ICD-10-CM

## 2021-03-11 NOTE — Patient Instructions (Addendum)
Medication Instructions:  The current medical regimen is effective;  continue present plan and medications.  *If you need a refill on your cardiac medications before your next appointment, please call your pharmacy*   Testing/Procedures: CT ANGIO ABD/PELVIS- hospital will call you to scheduled.     Follow-Up: At [facility], you and your health needs are our priority.  As part of our continuing mission to provide you with exceptional heart care, we have created designated Provider Care Teams.  These Care Teams include your primary Cardiologist (physician) and Advanced Practice Providers (APPs -  Physician Assistants and Nurse Practitioners) who all work together to provide you with the care you need, when you need it.  We recommend signing up for the patient portal called "MyChart".  Sign up information is provided on this After Visit Summary.  MyChart is used to connect with patients for Virtual Visits (Telemedicine).  Patients are able to view lab/test results, encounter notes, upcoming appointments, etc.  Non-urgent messages can be sent to your provider as well.   To learn more about what you can do with MyChart, go to [email or web address].    Your next appointment:   After you receive scans.   The format for your next appointment:   In Person  Provider:   Nanetta Batty, MD

## 2021-03-11 NOTE — Progress Notes (Signed)
[name] returns today for follow-up.  Unfortunately he did not get his CT angiogram to further evaluate his anatomy with regards to his SFA lesion and its relationship to the left limb of the aortobifem.  This may best be treated surgically although I would rather get anatomic information prior to making that decision.  Doppler performed 01/09/2021 did show a high-frequency's signal of the origin of his left SFA.  I will see him back after the CTA to further evaluate and make a final disposition.  Runell Gess, M.D., FACP, [facility], [facility] [address] [place], [place]  [address]  [phone] 03/11/2021 1:56 PM

## 2021-03-25 ENCOUNTER — Telehealth: Payer: Self-pay | Admitting: Cardiovascular Disease

## 2021-03-25 NOTE — Telephone Encounter (Signed)
Patient's spouse called and wanted to talk with someone regarding CT scan. Says that patient is starting to have a hard time with leg

## 2021-03-25 NOTE — Telephone Encounter (Signed)
Pt's wife calling today to inquire about when [name] [name] CT will be scheduled. He has a gout flare up in his foot and wanted to be sure it wasn't related to any blockages in his leg.  I advised his wife his gout was likely unrelated to his PVD and recommended she call his PCP for treatment. I will also message scheduling to remind them he is still waiting for his CT.

## 2021-04-09 ENCOUNTER — Telehealth: Payer: Self-pay

## 2021-04-09 NOTE — Telephone Encounter (Signed)
I spoke with the patient wife and she agreed to have him send a manual transmission when he wakes up.

## 2021-04-13 NOTE — Progress Notes (Unsigned)
No ICM remote transmission received for 04/06/2021 and next ICM transmission scheduled for 04/27/2021.    

## 2021-04-14 ENCOUNTER — Telehealth: Payer: Self-pay | Admitting: Cardiovascular Disease

## 2021-04-14 DIAGNOSIS — Z8679 Personal history of other diseases of the circulatory system: Secondary | ICD-10-CM

## 2021-04-14 NOTE — Telephone Encounter (Signed)
New Message:     Daughter is calling to see about procedure patient is supposed to have at the hospital. She said it have been over a month and she have not heard anything.

## 2021-04-14 NOTE — Telephone Encounter (Signed)
Daughter called stating she has not heard anything regarding scheduling CTA Abd/Pelv that was ordered at [facility]. Nurse cancelled ordered and reordered for [facility]. Nurse also provided daughter number in case she doesn't hear anything in the next few days. Daughter verbalized understanding.

## 2021-04-15 ENCOUNTER — Telehealth: Payer: Self-pay | Admitting: Cardiology

## 2021-04-15 ENCOUNTER — Telehealth: Payer: Self-pay | Admitting: Cardiovascular Disease

## 2021-04-15 NOTE — Telephone Encounter (Signed)
Spoke with pt wife, aware of the new CT scan appointment. No openings right now, explained will wait on the results and schedule follow up per dr berry direction.

## 2021-04-15 NOTE — Telephone Encounter (Signed)
   Pt's wife requesting to speak with Dr. Elissa Hefty nurse to discuss about pt's sodium

## 2021-04-15 NOTE — Telephone Encounter (Signed)
[name] [name] is calling stating she needs to get a f/u with Dr. Allyson Sabal right after the CT on 08/03. Please advise.

## 2021-04-15 NOTE — Telephone Encounter (Signed)
Unable to reach pt or leave a message, CT scan moved up to Monday 04-20-21 @ 4:30 pm at [facility].

## 2021-04-15 NOTE — Telephone Encounter (Signed)
Pts daughter is calling today to discuss needing an appt asap after his CT scan is complete. There has been a delay in having the scan done due to the hospital rescheduling several times. She states now he has become progressively worse. He no longer can stand for more that a few minutes, he is in constant pain, he cannot sleep, his foot changes colors with positioning, and it is cold.  I agreed with her that he is in need of an urgent follow up appointment. I will send MD and his nurse an urgent request to follow up.

## 2021-04-15 NOTE — Telephone Encounter (Signed)
Called and spoke to patients wife, [facility] they did blood work and seen that his levels were a little low and PA was uncomfortable making adjustments to the lasix medication and would like Dr.Harding to take over the RX on this and see if he thought there should be any adjustments. PCP note is in the chart- recent blood work below for MD to review easily- done on 07/25  CREATININE 0.9    0.6-1.4         UREA NITROGEN 10    8-23        GLUCOSE 102    70-110        SODIUM 126  L 134-144        POTASSIUM 4.5    3.5-5.4        CHLORIDE 96  L 100-110        CO2 32  H 22-31        CALCIUM 9.3    8.5-10.1        GFR ESTIMATION (CKD-EPI) 90    >=90

## 2021-04-16 NOTE — Telephone Encounter (Signed)
Sodium level is a little low.  I would like for him to hold his Lasix for 3 days.  We can recheck a chemistry panel here (or at the closest location) to reassess.  We will need to do a complete medication review (have asked KristIn to review as well) to see if any other medications could be causing hyponatremia.   Bryan Lemma, MD

## 2021-04-17 ENCOUNTER — Ambulatory Visit (INDEPENDENT_AMBULATORY_CARE_PROVIDER_SITE_OTHER): Payer: Medicare Other

## 2021-04-17 DIAGNOSIS — I5022 Chronic systolic (congestive) heart failure: Secondary | ICD-10-CM

## 2021-04-17 DIAGNOSIS — I255 Ischemic cardiomyopathy: Secondary | ICD-10-CM

## 2021-04-17 NOTE — Telephone Encounter (Signed)
Agreed - Not really a fan of long-term PPI anyway.    We can contact him next week to let him know & recheck labs.  Bryan Lemma, MD

## 2021-04-17 NOTE — Telephone Encounter (Signed)
Omeprazole has also been known to cause some hyponatremia (seen in PPI's in general).  Might want to transition him to famotidine if he does need GERD therapy.

## 2021-04-18 LAB — CUP PACEART REMOTE DEVICE CHECK
Battery Remaining Longevity: 34 mo
Battery Remaining Percentage: 39 %
Battery Voltage: 2.92 V
Brady Statistic AP VP Percent: 1 %
Brady Statistic AP VS Percent: 1 %
Brady Statistic AS VP Percent: 98 %
Brady Statistic AS VS Percent: 1 %
Brady Statistic RA Percent Paced: 1 %
Date Time Interrogation Session: 20220729031730
HighPow Impedance: 63 Ohm
HighPow Impedance: 63 Ohm
Implantable Lead Implant Date: 20171215
Implantable Lead Implant Date: 20171215
Implantable Lead Implant Date: 20171215
Implantable Lead Location: 753858
Implantable Lead Location: 753859
Implantable Lead Location: 753860
Implantable Lead Model: 7122
Implantable Pulse Generator Implant Date: 20171215
Lead Channel Impedance Value: 350 Ohm
Lead Channel Impedance Value: 480 Ohm
Lead Channel Impedance Value: 540 Ohm
Lead Channel Pacing Threshold Amplitude: 0.5 V
Lead Channel Pacing Threshold Amplitude: 0.625 V
Lead Channel Pacing Threshold Amplitude: 0.75 V
Lead Channel Pacing Threshold Pulse Width: 0.5 ms
Lead Channel Pacing Threshold Pulse Width: 0.5 ms
Lead Channel Pacing Threshold Pulse Width: 0.5 ms
Lead Channel Sensing Intrinsic Amplitude: 12 mV
Lead Channel Sensing Intrinsic Amplitude: 3 mV
Lead Channel Setting Pacing Amplitude: 2 V
Lead Channel Setting Pacing Amplitude: 2 V
Lead Channel Setting Pacing Amplitude: 2 V
Lead Channel Setting Pacing Pulse Width: 0.5 ms
Lead Channel Setting Pacing Pulse Width: 0.5 ms
Lead Channel Setting Sensing Sensitivity: 0.5 mV
Pulse Gen Serial Number: 7368976

## 2021-04-20 ENCOUNTER — Other Ambulatory Visit: Payer: Self-pay

## 2021-04-20 ENCOUNTER — Ambulatory Visit (HOSPITAL_COMMUNITY)
Admission: RE | Admit: 2021-04-20 | Discharge: 2021-04-20 | Disposition: A | Discharge status: Home / Self Care | Payer: Medicare Other | Source: Ambulatory Visit | Attending: Cardiovascular Disease | Admitting: Cardiovascular Disease

## 2021-04-20 DIAGNOSIS — K429 Umbilical hernia without obstruction or gangrene: Secondary | ICD-10-CM | POA: Diagnosis not present

## 2021-04-20 DIAGNOSIS — Z9889 Other specified postprocedural states: Secondary | ICD-10-CM | POA: Insufficient documentation

## 2021-04-20 DIAGNOSIS — I701 Atherosclerosis of renal artery: Secondary | ICD-10-CM | POA: Diagnosis not present

## 2021-04-20 DIAGNOSIS — Z8679 Personal history of other diseases of the circulatory system: Secondary | ICD-10-CM | POA: Insufficient documentation

## 2021-04-20 DIAGNOSIS — T82868A Thrombosis of vascular prosthetic devices, implants and grafts, initial encounter: Secondary | ICD-10-CM | POA: Diagnosis not present

## 2021-04-20 DIAGNOSIS — K551 Chronic vascular disorders of intestine: Secondary | ICD-10-CM | POA: Diagnosis not present

## 2021-04-20 LAB — POCT I-STAT CREATININE: Creatinine, Ser: 0.9 mg/dL (ref 0.61–1.24)

## 2021-04-20 MED ORDER — IOHEXOL 350 MG/ML SOLN
100.0000 mL | Freq: Once | INTRAVENOUS | Status: AC | PRN
  Administered 2021-04-20: 100 mL via INTRAVENOUS

## 2021-04-22 ENCOUNTER — Other Ambulatory Visit: Payer: Medicare Other

## 2021-04-23 ENCOUNTER — Telehealth: Payer: Self-pay

## 2021-04-23 NOTE — Telephone Encounter (Signed)
Spoke with the patient's wife who states that they were told that after the patient's CT scan was reviewed by Dr. Allyson Sabal that they would get a call with results and an appointment. I advised her that Dr. Allyson Sabal has not yet reviewed the patient's CT scan but that we would call as soon as he did with the results and when he needed to follow up. She verbalized understanding.

## 2021-04-23 NOTE — Telephone Encounter (Signed)
The patient wife called to speak with Dr. Allyson Sabal nurse. She states the patient had his CT scan on Monday and she was supposed to contact Dr. Allyson Sabal nurse to get him on the schedule. She states the patient is having a lot pain.

## 2021-04-27 ENCOUNTER — Ambulatory Visit (INDEPENDENT_AMBULATORY_CARE_PROVIDER_SITE_OTHER): Payer: Medicare Other

## 2021-04-27 DIAGNOSIS — I5022 Chronic systolic (congestive) heart failure: Secondary | ICD-10-CM | POA: Diagnosis not present

## 2021-04-27 DIAGNOSIS — Z95810 Presence of automatic (implantable) cardiac defibrillator: Secondary | ICD-10-CM | POA: Diagnosis not present

## 2021-04-27 NOTE — Telephone Encounter (Signed)
Follow up scheduled with [facility] 

## 2021-04-28 ENCOUNTER — Telehealth: Payer: Self-pay

## 2021-04-28 NOTE — Telephone Encounter (Signed)
Pt's wife called regarding pt having pain.Pt's wife is asking for recommendations to help with pt's pain until he is seen.  Pt has not been seen yet in this office and has a new pt appt in one week with MD. I have advised her to call back Dr. Allyson Sabal who referred pt, as we have not seen pt in office yet. Wife verbalized understanding.

## 2021-04-29 NOTE — Progress Notes (Signed)
EPIC Encounter for ICM Monitoring  Patient Name: [name] [name] is a 75 y.o. male Date: 04/29/2021 Primary Care Physican: Dellia Cloud, MD Primary Cardiologist: Herbie Baltimore Electrophysiologist: Rae Roam Pacing: 98%     03/02/2021 Weight: 142 lbs     Spoke with wife and heart failure questions reviewed.  Pt asymptomatic for fluid accumulation and feeling well except for vascular leg.  Corvue thoracic impedance suggesting normal fluids.     Prescribed:  Furosemide 40 mg tablets - Alternate taking 20 mg daily and 40 mg daily Aspirin 81 mg daily   Labs: 12/12/2019 Creatinine 0.9, BUN 6, Potassium 4.5, Sodium 133, GFR 88.2 A complete set of results can be found in Results Review.   Recommendations:  No changes and encouraged to call if experiencing any fluid symptoms.   Follow-up plan: ICM clinic phone appointment on 06/01/2021.   91 day device clinic remote transmission 05/19/2021.     EP/Cardiology Office Visits:   None   Copy of ICM check sent to Dr. Ladona Ridgel.   3 month ICM trend: 04/27/2021.    1 Year ICM trend:       Karie Soda, RN 04/29/2021 3:38 PM

## 2021-05-01 ENCOUNTER — Telehealth: Payer: Self-pay | Admitting: Cardiology

## 2021-05-01 DIAGNOSIS — Z79899 Other long term (current) drug therapy: Secondary | ICD-10-CM

## 2021-05-01 MED ORDER — FUROSEMIDE 20 MG PO TABS: 20.0000 mg | ORAL_TABLET | Freq: Every day | ORAL | 3 refills | Status: DC

## 2021-05-01 NOTE — Telephone Encounter (Signed)
Pt made aware of the following information listed below from Dr. Herbie Baltimore. Pt verbalized understanding and state he already take famotidine 20 mg QHS but will stop omeperazole along with following lasix instructions.   Medication list updated and message sent to scheduling for CVRR appointment.   D/c Omeperazole - start Famotidine 20 mg daily   Hold Lasix x 2 days - then restart @ 20 mg daily (with additional dose PRN worsening edema).   Recommend CVRR visit to relook @ meds - check BMP here when he comes in,.   Bryan Lemma, MD

## 2021-05-01 NOTE — Telephone Encounter (Signed)
D/c Omeperazole - start Famotidine 20 mg daily  Hold Lasix x 2 days - then restart @ 20 mg daily (with additional dose PRN worsening edema).  Recommend CVRR visit to relook @ meds - check BMP here when he comes in,.  Bryan Lemma, MD

## 2021-05-01 NOTE — Telephone Encounter (Signed)
Pt's wife called to let our office know that [name] [name] sodium level is low 128. Dr. Tereasa Coop at the [facility] hospital advised [name] [name] to call to let Dr. Herbie Baltimore know. Please advise pt further. Pt said he will be leaving this morning but you can leave a message on the answering service

## 2021-05-01 NOTE — Telephone Encounter (Signed)
Spoke with pt's wife who report Dr. Tereasa Coop wanted wife to make Dr. Herbie Baltimore aware that pt's sodium level was 128. She report MD states she doesn't feel comfortable managing pt's lasix dose and would prefer to route to cardiologist      Wife report pt currently alternate 20 mg and 40 mg every other day.  Wife also wanted to make our office aware that Dr. Lin Landsman fax number has changed and listed below.  Fax [phone]

## 2021-05-01 NOTE — Progress Notes (Signed)
Returned patient call.  He asked if report has shown in dryness because his physician said on 7/27 that his lab results showed low sodium and thought he might be dehydrated.  Advised report showed possible dryness from 7/28/7/31. H said PCP has deferred any changes in diuretic to Dr Herbie Baltimore and his recommendations.  He is waiting on call back from Dr Erich Montane office with update on Furosemide prescription and what he should take. He thinks the dosage will be lowered.  Advised he should receive a call back before the end of the day.    Advised to follow Dr Erich Montane recommendation when he receives it and will follow up with him in September.  Advised to call back if he feels dehydrated or if he has any fluid symptoms.

## 2021-05-05 ENCOUNTER — Other Ambulatory Visit: Payer: Self-pay

## 2021-05-05 ENCOUNTER — Encounter: Payer: Self-pay | Admitting: Vascular Surgery

## 2021-05-05 ENCOUNTER — Ambulatory Visit (INDEPENDENT_AMBULATORY_CARE_PROVIDER_SITE_OTHER): Payer: Medicare Other | Admitting: Vascular Surgery

## 2021-05-05 DIAGNOSIS — I70229 Atherosclerosis of native arteries of extremities with rest pain, unspecified extremity: Secondary | ICD-10-CM | POA: Diagnosis not present

## 2021-05-05 NOTE — H&P (View-Only) (Signed)
Patient name: [name] [name] MRN: [id number] DOB: Aug 31, 1946 Sex: male  REASON FOR CONSULT: Left common femoral sub-total occlusion in setting of previous aortobifem  HPI: [name] [name] is a 75 y.o. male, with history hypertension, hyperlipidemia, diabetes, coronary artery disease status post CABG, congestive heart failure (EF 25-30% 02/05/21) that presents for evaluation of high-grade calcified left common femoral stenosis in setting of left leg claudication.  He was referred by Dr. Allyson Sabal with cardiology.  He describes his left foot hurting all the time now.  Unfortunately he has now progressed to tissue loss and states he got wounds on his feet over the last several weeks.  He states his shoe rubbed ulcers on the back of the heel and on the toes.  His aortobifem was done at the [facility] 2014.  States he is ambulatory with a cane.  Sometimes he has to use a wheelchair when going long distances.  He does have a severely reduced EF.  He denies any chest pain or shortness of breath at this time.  Past Medical History:  Diagnosis Date   Acid reflux    AICD (automatic cardioverter/defibrillator) present    a. 08/2016 St. Jude (serial Number [id number]) biventricular ICD.   Chronic systolic CHF (congestive heart failure) (HCC)    a. 08/2016 Echo: EF 20%, diffuse HK, inf, post AK, mod-sev MR, sev dil LA, mod TR, PASP .   Complication of anesthesia    "was told I responded well to anesthesia"   Coronary artery disease involving native coronary artery of native heart with angina pectoris (HCC)    a. 10/2012 MI after aortobifem bypass, found to have multivessel CAD -->CABG x3;  b. 08/2016 Cath: LM 60, LAD 56m, LCX 60ost/p, RCA 100p, VG->RPDA 50p, VG->OM1 ok, LIMA->LAD ok, EF 25%.   Essential hypertension 03/03/2012   History of blood transfusion 11/2012   "during his CABG"   History of gout X 1   History of stomach ulcers 1970s   Hyperlipidemia associated with type 2 diabetes mellitus (HCC) 09/16/2011    Ischemic cardiomyopathy    a. 08/2016 Echo: EF 20%, diffuse HK, inf, post AK;  b. 08/2016 St. Jude (serial Number [id number]) biventricular ICD.   PAD (peripheral artery disease) (HCC)    s/p aortobifemoral bypass 10/2012   PTSD (post-traumatic stress disorder)    Type II diabetes mellitus (HCC)    Ventricular tachycardia (HCC)    a. 08/2016 St. Jude (serial Number [id number]) biventricular ICD-->oral amio added.   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 Is now on chronic suppressive antibiotics with dicloxacillin    Past Surgical History:  Procedure Laterality Date   AORTO-FEMORAL BYPASS GRAFT Bilateral 10/2012   [facility]   CARDIAC CATHETERIZATION  04/03/2013   [place] (? for abnormal Nuclear ST) --> no PCI, by report, patent grafts.   CARDIAC CATHETERIZATION N/A 09/02/2016   Procedure: Left Heart Cath and Cors/Grafts Angiography;  Surgeon: Kathleene Hazel, MD;  [facility]: [facility] INVASIVE CV LAB:: Ost LM 60% -> mid LAD 99% subCTO, ost-prox LCx 60%; prox RCA 100% CTO w/ (small) SVG->RPDA prox 50%; SVG->OM1 ok, LIMA->mLAD ok, EF 25% - complicated by VT during access - Shock x 2, Lidocaine & Amiodarone   CARDIAC CATHETERIZATION  11/09/2012   [facility]: due to Sustained VT post-Ob Ao-BiFem Bypass. (no Angina)   CHOLECYSTECTOMY  03/05/2012   Procedure: LAPAROSCOPIC CHOLECYSTECTOMY WITH INTRAOPERATIVE  CHOLANGIOGRAM;  Surgeon: Emelia Loron, MD;  Location: [facility] OR;  Service: General;  Laterality: N/A;   CORONARY ARTERY BYPASS GRAFT  [facility]/2012    [facility] -> LIMA-mLAD, SVG-highOM1, SVG-RPDA (dRCA).   EP IMPLANTABLE DEVICE N/A 09/03/2016   Procedure: BI-VENTRICULAR IMPLANTABLE CARDIOVERTER DEFIBRILLATOR  (CRT-D);  Surgeon: Marinus Maw, MD;  Location: [facility] INVASIVE CV LAB;  Service: Cardiovascular;  Laterality: N/A;   INGUINAL HERNIA REPAIR Left 1990s    STERNOTOMY  09/2013   [facility] [place]): Sternal Wound Infection -> re-do sternotomy with metal place "sheild" placed. -Has chronic staph infection, on chronic suppressive medication   TRANSTHORACIC ECHOCARDIOGRAM  02/28/2017   EF remains 20p-25% severely reduced function. Diffuse hypokinesis. "Grade 1 diastolic dysfunction ". Severely calcified aortic valve with restricted mobility (functioning bicuspid) however no suggestion of stenosis.   UMBILICAL HERNIA REPAIR  03/05/2012   Procedure: HERNIA REPAIR UMBILICAL ADULT;  Surgeon: Emelia Loron, MD;  Location: [facility] OR;  Service: General;  Laterality: N/A;    Family History  Problem Relation Age of Onset   Cancer Brother        esophageal    SOCIAL HISTORY: Social History   Socioeconomic History   Marital status: Married    Spouse name: Not on file   Number of children: Not on file   Years of education: Not on file   Highest education level: Not on file  Occupational History   Not on file  Tobacco Use   Smoking status: Every Day    Types: Pipe   Smokeless tobacco: Former    Types: Snuff, Chew   Tobacco comments:    09/03/2016 "used smokeless tobacco in my teens; quit smoking cigarettes in the early 1980s; smokes pipe only"  Vaping Use   Vaping Use: Never used  Substance and Sexual Activity   Alcohol use: No   Drug use: No   Sexual activity: Not on file  Other Topics Concern   Not on file  Social History Narrative   Routine activity around the house, but is limited more by musculoskeletal leg pain and weakness.      He enjoys smoking a pipe-long ago, he quit cigarettes   Social Determinants of Health   Financial Resource Strain: Not on file  Food Insecurity: Not on file  Transportation Needs: Not on file  Physical Activity: Not on file  Stress: Not on file  Social Connections: Not on file  Intimate Partner Violence: Not on file    No Known Allergies  Current Outpatient Medications  Medication Sig Dispense Refill    naproxen (NAPROSYN) 500 MG tablet Take 500 mg by mouth as needed.     albuterol (VENTOLIN HFA) 108 (90 Base) MCG/ACT inhaler Inhale 1-2 puffs into the lungs every 6 (six) hours as needed for wheezing or shortness of breath.     Alpha-Lipoic Acid 600 MG TABS Take 2 tablets by mouth 2 (two) times daily.     aspirin EC 81 MG tablet Take 81 mg by mouth daily.     atorvastatin (LIPITOR) 80 MG tablet Take 0.5 tablets by mouth daily.     carvedilol (COREG) 6.25 MG tablet Take 1 tablet (6.25 mg total) by mouth 2 (two) times daily with a meal. 180 tablet 2   Cholecalciferol (VITAMIN D-3 PO) Take 2,000 mg by mouth daily.      dicloxacillin (DYNAPEN) 250 MG capsule Take 500 mg by mouth 3 (three) times daily.     famotidine (PEPCID) 20 MG tablet Take 20  mg by mouth as needed.     ferrous sulfate 324 MG TBEC Take 324 mg by mouth daily with breakfast.     fluticasone (FLONASE) 50 MCG/ACT nasal spray Place 2 sprays into both nostrils daily as needed for allergies or rhinitis.     furosemide (LASIX) 20 MG tablet Take 1 tablet (20 mg total) by mouth daily. May take additional dose as needed for worsening swelling 90 tablet 3   glipiZIDE (GLUCOTROL) 5 MG tablet Take 5 mg by mouth 2 (two) times daily before a meal.     magnesium oxide (MAG-OX) 400 (241.3 Mg) MG tablet Take 1 tablet (400 mg total) by mouth daily. 30 tablet 6   metFORMIN (GLUCOPHAGE) 1000 MG tablet Take 1 tablet (1,000 mg total) by mouth 2 (two) times daily with a meal. Resume on 09/06/2016     nitroGLYCERIN (NITROSTAT) 0.4 MG SL tablet Place 1 tablet (0.4 mg total) under the tongue every 5 (five) minutes x 3 doses as needed for chest pain. 25 tablet 3   Tiotropium Bromide Monohydrate (SPIRIVA RESPIMAT) 2.5 MCG/ACT AERS Inhale 2 puffs into the lungs daily.     valsartan (DIOVAN) 40 MG tablet Take 40 mg by mouth daily.     No current facility-administered medications for this visit.    REVIEW OF SYSTEMS:  [X]  denotes positive finding, [ ]  denotes  negative finding Cardiac  Comments:  Chest pain or chest pressure:    Shortness of breath upon exertion:    Short of breath when lying flat:    Irregular heart rhythm:        Vascular    Pain in calf, thigh, or hip brought on by ambulation:    Pain in feet at night that wakes you up from your sleep:  x Left  Blood clot in your veins:    Leg swelling:         Pulmonary    Oxygen at home:    Productive cough:     Wheezing:         Neurologic    Sudden weakness in arms or legs:     Sudden numbness in arms or legs:     Sudden onset of difficulty speaking or slurred speech:    Temporary loss of vision in one eye:     Problems with dizziness:         Gastrointestinal    Blood in stool:     Vomited blood:         Genitourinary    Burning when urinating:     Blood in urine:        Psychiatric    Major depression:         Hematologic    Bleeding problems:    Problems with blood clotting too easily:        Skin    Rashes or ulcers:        Constitutional    Fever or chills:      PHYSICAL EXAM: Vitals:   05/05/21 1008  BP: 132/74  Pulse: 80  Resp: 18  Temp: (!) 97.3 F (36.3 C)  TempSrc: Temporal  SpO2: 94%  Weight: 140 lb (63.5 kg)  Height: 5\' 6"  (1.676 m)    GENERAL: The patient is a well-nourished male, in no acute distress. The vital signs are documented above. CARDIAC: There is a regular rate and rhythm.  VASCULAR:  Bilateral femoral pulses are palpable No palpable pedal pulses Left foot has dependent rubor with  multiple ulcerations as pictured below including the great toe and dorsum of the foot PULMONARY: No respiratory distress ABDOMEN: Soft and non-tender MUSCULOSKELETAL: There are no major deformities or cyanosis. NEUROLOGIC: No focal weakness or paresthesias are detected. PSYCHIATRIC: The patient has a normal affect.       DATA:   ABIs 01/09/2021 on the right 1.0 and on the left 0.93 with blunted waveforms and a toe pressure of 0  CTA  04/20/2021 reviewed and he has dense calcification in the common femoral artery on the left just distal to the aortobifem graft that appears high grade. Appears to have a stenosis in the proximal profunda.  SFA runoff is difficult to evaluate.  Assessment/Plan:  75 year old male presents for evaluation of high-grade left common femoral artery stenosis with dense calcification just distal to his aortobifemoral bypass.  Sounds like he had a history of left lower extremity claudication but unfortunately this has progressed to rest pain and now with tissue loss as pictured above.  He is going to require left common femoral endarterectomy with profundoplasty +/- a left femoral to distal bypass.  I do not know if a endarterectomy alone would be enough to get his foot wounds to heal given a toe pressure of 0.  I have scheduled him for aortogram with lower extremity arteriogram with my partner Dr. Lenell Antu on Thursday (since I am out of town) through a right transfemoral approach.  Discussed this will be diagnostic and then I will schedule him for surgery next Wednesday with me for left endarterectomy and possible bypass.  I have expressed my concerns with his cardiac status and I will send a note to his cardiologist to ensure he is optimized.  He has no chest pain shortness of breath or other active symptoms at this time.  Last EF was 25 to 30%.  This does appear to be a limb threatening situation.  Risk and benefits discussed with family.   Cephus Shelling, MD [facility] Office: [phone]

## 2021-05-05 NOTE — Progress Notes (Signed)
  Patient name: [name] [name] MRN: [id number] DOB: 10/13/1945 Sex: male  REASON FOR CONSULT: Left common femoral sub-total occlusion in setting of previous aortobifem  HPI: [name] [name] is a 75 y.o. male, with history hypertension, hyperlipidemia, diabetes, coronary artery disease status post CABG, congestive heart failure (EF 25-30% 02/05/21) that presents for evaluation of high-grade calcified left common femoral stenosis in setting of left leg claudication.  He was referred by Dr. Berry with cardiology.  He describes his left foot hurting all the time now.  Unfortunately he has now progressed to tissue loss and states he got wounds on his feet over the last several weeks.  He states his shoe rubbed ulcers on the back of the heel and on the toes.  [facility]  States he is ambulatory with a cane.  Sometimes he has to use a wheelchair when going long distances.  He does have a severely reduced EF.  He denies any chest pain or shortness of breath at this time.  Past Medical History:  Diagnosis Date   Acid reflux    AICD (automatic cardioverter/defibrillator) present    a. 08/2016 St. Jude (serial Number [id number]) biventricular ICD.   Chronic systolic CHF (congestive heart failure) (HCC)    a. 08/2016 Echo: EF 20%, diffuse HK, inf, post AK, mod-sev MR, sev dil LA, mod TR, PASP 59mmHg.   Complication of anesthesia    "was told I responded well to anesthesia"   Coronary artery disease involving native coronary artery of native heart with angina pectoris (HCC)    a. 10/2012 MI after aortobifem bypass, found to have multivessel CAD -->CABG x3;  b. 08/2016 Cath: LM 60, LAD 99m, LCX 60ost/p, RCA 100p, VG->RPDA 50p, VG->OM1 ok, LIMA->LAD ok, EF 25%.   Essential hypertension 03/03/2012   History of blood transfusion 11/2012   "during his CABG"   History of gout X 1   History of stomach ulcers 1970s   Hyperlipidemia associated with type 2 diabetes mellitus (HCC) 09/16/2011    Ischemic cardiomyopathy    a. 08/2016 Echo: EF 20%, diffuse HK, inf, post AK;  b. 08/2016 St. Jude (serial Number [id number]) biventricular ICD.   PAD (peripheral artery disease) (HCC)    s/p aortobifemoral bypass 10/2012   PTSD (post-traumatic stress disorder)    Type II diabetes mellitus (HCC)    Ventricular tachycardia (HCC)    a. 08/2016 St. Jude (serial Number [id number]) biventricular ICD-->oral amio added.   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 Is now on chronic suppressive antibiotics with dicloxacillin    Past Surgical History:  Procedure Laterality Date   AORTO-FEMORAL BYPASS GRAFT Bilateral 10/2012   [place] [facility]   CARDIAC CATHETERIZATION  04/03/2013   [place] [facility] (? for abnormal Nuclear ST) --> no PCI, by report, patent grafts.   CARDIAC CATHETERIZATION N/A 09/02/2016   Procedure: Left Heart Cath and Cors/Grafts Angiography;  Surgeon: Infiniti Hoefling D McAlhany, MD;  [facility]: [facility] INVASIVE CV LAB:: Ost LM 60% -> mid LAD 99% subCTO, ost-prox LCx 60%; prox RCA 100% CTO w/ (small) SVG->RPDA prox 50%; SVG->OM1 ok, LIMA->mLAD ok, EF 25% - complicated by VT during access - Shock x 2, Lidocaine & Amiodarone   CARDIAC CATHETERIZATION  11/09/2012   [place] [facility]: due to Sustained VT post-Ob Ao-BiFem Bypass. (no Angina)   CHOLECYSTECTOMY  03/05/2012   Procedure: LAPAROSCOPIC CHOLECYSTECTOMY WITH INTRAOPERATIVE   CHOLANGIOGRAM;  Surgeon: Emelia Loron, MD;  Location: [facility] OR;  Service: General;  Laterality: N/A;   CORONARY ARTERY BYPASS GRAFT  11/2012   [place] [facility]: CABG X 3 -> LIMA-mLAD, SVG-highOM1, SVG-RPDA (dRCA).   EP IMPLANTABLE DEVICE N/A 09/03/2016   Procedure: BI-VENTRICULAR IMPLANTABLE CARDIOVERTER DEFIBRILLATOR  (CRT-D);  Surgeon: Marinus Maw, MD;  Location: [facility] INVASIVE CV LAB;  Service: Cardiovascular;  Laterality: N/A;   INGUINAL HERNIA REPAIR Left 1990s    STERNOTOMY  09/2013   [facility] [place]): Sternal Wound Infection -> re-do sternotomy with metal place "sheild" placed. -Has chronic staph infection, on chronic suppressive medication   TRANSTHORACIC ECHOCARDIOGRAM  02/28/2017   EF remains 20p-25% severely reduced function. Diffuse hypokinesis. "Grade 1 diastolic dysfunction ". Severely calcified aortic valve with restricted mobility (functioning bicuspid) however no suggestion of stenosis.   UMBILICAL HERNIA REPAIR  03/05/2012   Procedure: HERNIA REPAIR UMBILICAL ADULT;  Surgeon: Emelia Loron, MD;  Location: [facility] OR;  Service: General;  Laterality: N/A;    Family History  Problem Relation Age of Onset   Cancer Brother        esophageal    SOCIAL HISTORY: Social History   Socioeconomic History   Marital status: Married    Spouse name: Not on file   Number of children: Not on file   Years of education: Not on file   Highest education level: Not on file  Occupational History   Not on file  Tobacco Use   Smoking status: Every Day    Types: Pipe   Smokeless tobacco: Former    Types: Snuff, Chew   Tobacco comments:    09/03/2016 "used smokeless tobacco in my teens; quit smoking cigarettes in the early 1980s; smokes pipe only"  Vaping Use   Vaping Use: Never used  Substance and Sexual Activity   Alcohol use: No   Drug use: No   Sexual activity: Not on file  Other Topics Concern   Not on file  Social History Narrative   Routine activity around the house, but is limited more by musculoskeletal leg pain and weakness.      He enjoys smoking a pipe-long ago, he quit cigarettes   Social Determinants of Health   Financial Resource Strain: Not on file  Food Insecurity: Not on file  Transportation Needs: Not on file  Physical Activity: Not on file  Stress: Not on file  Social Connections: Not on file  Intimate Partner Violence: Not on file    No Known Allergies  Current Outpatient Medications  Medication Sig Dispense Refill    naproxen (NAPROSYN) 500 MG tablet Take 500 mg by mouth as needed.     albuterol (VENTOLIN HFA) 108 (90 Base) MCG/ACT inhaler Inhale 1-2 puffs into the lungs every 6 (six) hours as needed for wheezing or shortness of breath.     Alpha-Lipoic Acid 600 MG TABS Take 2 tablets by mouth 2 (two) times daily.     aspirin EC 81 MG tablet Take 81 mg by mouth daily.     atorvastatin (LIPITOR) 80 MG tablet Take 0.5 tablets by mouth daily.     carvedilol (COREG) 6.25 MG tablet Take 1 tablet (6.25 mg total) by mouth 2 (two) times daily with a meal. 180 tablet 2   Cholecalciferol (VITAMIN D-3 PO) Take 2,000 mg by mouth daily.      dicloxacillin (DYNAPEN) 250 MG capsule Take 500 mg by mouth 3 (three) times daily.     famotidine (PEPCID) 20 MG tablet Take 20  mg by mouth as needed.     ferrous sulfate 324 MG TBEC Take 324 mg by mouth daily with breakfast.     fluticasone (FLONASE) 50 MCG/ACT nasal spray Place 2 sprays into both nostrils daily as needed for allergies or rhinitis.     furosemide (LASIX) 20 MG tablet Take 1 tablet (20 mg total) by mouth daily. May take additional dose as needed for worsening swelling 90 tablet 3   glipiZIDE (GLUCOTROL) 5 MG tablet Take 5 mg by mouth 2 (two) times daily before a meal.     magnesium oxide (MAG-OX) 400 (241.3 Mg) MG tablet Take 1 tablet (400 mg total) by mouth daily. 30 tablet 6   metFORMIN (GLUCOPHAGE) 1000 MG tablet Take 1 tablet (1,000 mg total) by mouth 2 (two) times daily with a meal. Resume on 09/06/2016     nitroGLYCERIN (NITROSTAT) 0.4 MG SL tablet Place 1 tablet (0.4 mg total) under the tongue every 5 (five) minutes x 3 doses as needed for chest pain. 25 tablet 3   Tiotropium Bromide Monohydrate (SPIRIVA RESPIMAT) 2.5 MCG/ACT AERS Inhale 2 puffs into the lungs daily.     valsartan (DIOVAN) 40 MG tablet Take 40 mg by mouth daily.     No current facility-administered medications for this visit.    REVIEW OF SYSTEMS:  [X]  denotes positive finding, [ ]  denotes  negative finding Cardiac  Comments:  Chest pain or chest pressure:    Shortness of breath upon exertion:    Short of breath when lying flat:    Irregular heart rhythm:        Vascular    Pain in calf, thigh, or hip brought on by ambulation:    Pain in feet at night that wakes you up from your sleep:  x Left  Blood clot in your veins:    Leg swelling:         Pulmonary    Oxygen at home:    Productive cough:     Wheezing:         Neurologic    Sudden weakness in arms or legs:     Sudden numbness in arms or legs:     Sudden onset of difficulty speaking or slurred speech:    Temporary loss of vision in one eye:     Problems with dizziness:         Gastrointestinal    Blood in stool:     Vomited blood:         Genitourinary    Burning when urinating:     Blood in urine:        Psychiatric    Major depression:         Hematologic    Bleeding problems:    Problems with blood clotting too easily:        Skin    Rashes or ulcers:        Constitutional    Fever or chills:      PHYSICAL EXAM: Vitals:   05/05/21 1008  BP: 132/74  Pulse: 80  Resp: 18  Temp: (!) 97.3 F (36.3 C)  TempSrc: Temporal  SpO2: 94%  Weight: 140 lb (63.5 kg)  Height: 5\' 6"  (1.676 m)    GENERAL: The patient is a well-nourished male, in no acute distress. The vital signs are documented above. CARDIAC: There is a regular rate and rhythm.  VASCULAR:  Bilateral femoral pulses are palpable No palpable pedal pulses Left foot has dependent rubor with  multiple ulcerations as pictured below including the great toe and dorsum of the foot PULMONARY: No respiratory distress ABDOMEN: Soft and non-tender MUSCULOSKELETAL: There are no major deformities or cyanosis. NEUROLOGIC: No focal weakness or paresthesias are detected. PSYCHIATRIC: The patient has a normal affect.       DATA:   ABIs 01/09/2021 on the right 1.0 and on the left 0.93 with blunted waveforms and a toe pressure of 0  CTA  04/20/2021 reviewed and he has dense calcification in the common femoral artery on the left just distal to the aortobifem graft that appears high grade. Appears to have a stenosis in the proximal profunda.  SFA runoff is difficult to evaluate.  Assessment/Plan:  75 year old male presents for evaluation of high-grade left common femoral artery stenosis with dense calcification just distal to his aortobifemoral bypass.  Sounds like he had a history of left lower extremity claudication but unfortunately this has progressed to rest pain and now with tissue loss as pictured above.  He is going to require left common femoral endarterectomy with profundoplasty +/- a left femoral to distal bypass.  I do not know if a endarterectomy alone would be enough to get his foot wounds to heal given a toe pressure of 0.  I have scheduled him for aortogram with lower extremity arteriogram with my partner Dr. Lenell Antu on Thursday (since I am out of town) through a right transfemoral approach.  Discussed this will be diagnostic and then I will schedule him for surgery next Wednesday with me for left endarterectomy and possible bypass.  I have expressed my concerns with his cardiac status and I will send a note to his cardiologist to ensure he is optimized.  He has no chest pain shortness of breath or other active symptoms at this time.  Last EF was 25 to 30%.  This does appear to be a limb threatening situation.  Risk and benefits discussed with family.   Cephus Shelling, MD [facility] Office: [phone]

## 2021-05-07 ENCOUNTER — Ambulatory Visit (HOSPITAL_COMMUNITY)
Admission: RE | Admit: 2021-05-07 | Discharge: 2021-05-07 | Disposition: A | Discharge status: Home / Self Care | Payer: Medicare Other | Attending: Vascular Surgery | Admitting: Vascular Surgery

## 2021-05-07 ENCOUNTER — Encounter (HOSPITAL_COMMUNITY)
Admission: RE | Disposition: A | Discharge status: Home / Self Care | Payer: Self-pay | Source: Home / Self Care | Attending: Vascular Surgery

## 2021-05-07 ENCOUNTER — Other Ambulatory Visit: Payer: Self-pay

## 2021-05-07 ENCOUNTER — Encounter (HOSPITAL_COMMUNITY): Payer: Self-pay | Admitting: Vascular Surgery

## 2021-05-07 DIAGNOSIS — I509 Heart failure, unspecified: Secondary | ICD-10-CM | POA: Insufficient documentation

## 2021-05-07 DIAGNOSIS — Z951 Presence of aortocoronary bypass graft: Secondary | ICD-10-CM | POA: Insufficient documentation

## 2021-05-07 DIAGNOSIS — Z7984 Long term (current) use of oral hypoglycemic drugs: Secondary | ICD-10-CM | POA: Insufficient documentation

## 2021-05-07 DIAGNOSIS — Z79899 Other long term (current) drug therapy: Secondary | ICD-10-CM | POA: Insufficient documentation

## 2021-05-07 DIAGNOSIS — I110 Hypertensive heart disease with heart failure: Secondary | ICD-10-CM | POA: Insufficient documentation

## 2021-05-07 DIAGNOSIS — E11621 Type 2 diabetes mellitus with foot ulcer: Secondary | ICD-10-CM | POA: Diagnosis not present

## 2021-05-07 DIAGNOSIS — E785 Hyperlipidemia, unspecified: Secondary | ICD-10-CM | POA: Diagnosis not present

## 2021-05-07 DIAGNOSIS — Z7982 Long term (current) use of aspirin: Secondary | ICD-10-CM | POA: Insufficient documentation

## 2021-05-07 DIAGNOSIS — I2510 Atherosclerotic heart disease of native coronary artery without angina pectoris: Secondary | ICD-10-CM | POA: Diagnosis not present

## 2021-05-07 DIAGNOSIS — L97529 Non-pressure chronic ulcer of other part of left foot with unspecified severity: Secondary | ICD-10-CM | POA: Diagnosis not present

## 2021-05-07 DIAGNOSIS — E1151 Type 2 diabetes mellitus with diabetic peripheral angiopathy without gangrene: Secondary | ICD-10-CM | POA: Diagnosis not present

## 2021-05-07 DIAGNOSIS — Z87891 Personal history of nicotine dependence: Secondary | ICD-10-CM | POA: Diagnosis not present

## 2021-05-07 DIAGNOSIS — I70245 Atherosclerosis of native arteries of left leg with ulceration of other part of foot: Secondary | ICD-10-CM | POA: Diagnosis not present

## 2021-05-07 HISTORY — PX: ABDOMINAL AORTOGRAM W/LOWER EXTREMITY: CATH118223

## 2021-05-07 LAB — POCT I-STAT, CHEM 8
BUN: 6 mg/dL — ABNORMAL LOW (ref 8–23)
Calcium, Ion: 1.27 mmol/L (ref 1.15–1.40)
Chloride: 95 mmol/L — ABNORMAL LOW (ref 98–111)
Creatinine, Ser: 0.8 mg/dL (ref 0.61–1.24)
Glucose, Bld: 111 mg/dL — ABNORMAL HIGH (ref 70–99)
HCT: 38 % — ABNORMAL LOW (ref 39.0–52.0)
Hemoglobin: 12.9 g/dL — ABNORMAL LOW (ref 13.0–17.0)
Potassium: 4.4 mmol/L (ref 3.5–5.1)
Sodium: 133 mmol/L — ABNORMAL LOW (ref 135–145)
TCO2: 29 mmol/L (ref 22–32)

## 2021-05-07 LAB — GLUCOSE, CAPILLARY
Glucose-Capillary: 141 mg/dL — ABNORMAL HIGH (ref 70–99)
Glucose-Capillary: 155 mg/dL — ABNORMAL HIGH (ref 70–99)

## 2021-05-07 SURGERY — ABDOMINAL AORTOGRAM W/LOWER EXTREMITY
Anesthesia: LOCAL | Laterality: Left

## 2021-05-07 MED ORDER — SODIUM CHLORIDE 0.9 % WEIGHT BASED INFUSION: 1.0000 mL/kg/h | INTRAVENOUS | Status: DC

## 2021-05-07 MED ORDER — HEPARIN (PORCINE) IN NACL 1000-0.9 UT/500ML-% IV SOLN
INTRAVENOUS | Status: AC
  Filled 2021-05-07: qty 500

## 2021-05-07 MED ORDER — HEPARIN (PORCINE) IN NACL 1000-0.9 UT/500ML-% IV SOLN
INTRAVENOUS | Status: DC | PRN
  Administered 2021-05-07 (×2): 500 mL

## 2021-05-07 MED ORDER — SODIUM CHLORIDE 0.9 % IV SOLN: INTRAVENOUS | Status: DC

## 2021-05-07 MED ORDER — IODIXANOL 320 MG/ML IV SOLN
INTRAVENOUS | Status: DC | PRN
  Administered 2021-05-07: 65 mL

## 2021-05-07 MED ORDER — LIDOCAINE HCL (PF) 1 % IJ SOLN
INTRAMUSCULAR | Status: DC | PRN
  Administered 2021-05-07: 12 mL

## 2021-05-07 MED ORDER — FENTANYL CITRATE (PF) 100 MCG/2ML IJ SOLN
INTRAMUSCULAR | Status: AC
  Filled 2021-05-07: qty 2

## 2021-05-07 MED ORDER — LIDOCAINE HCL (PF) 1 % IJ SOLN
INTRAMUSCULAR | Status: AC
  Filled 2021-05-07: qty 30

## 2021-05-07 MED ORDER — MIDAZOLAM HCL 2 MG/2ML IJ SOLN
INTRAMUSCULAR | Status: AC
  Filled 2021-05-07: qty 2

## 2021-05-07 MED ORDER — OXYCODONE-ACETAMINOPHEN 5-325 MG PO TABS
2.0000 | ORAL_TABLET | ORAL | Status: DC | PRN
  Administered 2021-05-07: 2 via ORAL
  Filled 2021-05-07: qty 2

## 2021-05-07 MED ORDER — FENTANYL CITRATE (PF) 100 MCG/2ML IJ SOLN
INTRAMUSCULAR | Status: DC | PRN
  Administered 2021-05-07: 50 ug via INTRAVENOUS

## 2021-05-07 MED ORDER — MIDAZOLAM HCL 2 MG/2ML IJ SOLN
INTRAMUSCULAR | Status: DC | PRN
  Administered 2021-05-07: 1 mg via INTRAVENOUS

## 2021-05-07 SURGICAL SUPPLY — 7 items
CATH OMNI FLUSH 5F 65CM (CATHETERS) ×1 IMPLANT
KIT MICROPUNCTURE NIT STIFF (SHEATH) ×1 IMPLANT
KIT PV (KITS) ×2 IMPLANT
SHEATH PINNACLE 5F 10CM (SHEATH) ×1 IMPLANT
SHEATH PROBE COVER 6X72 (BAG) ×1 IMPLANT
TRANSDUCER W/STOPCOCK (MISCELLANEOUS) ×2 IMPLANT
WIRE STARTER BENTSON 035X150 (WIRE) ×1 IMPLANT

## 2021-05-07 NOTE — Interval H&P Note (Signed)
History and Physical Interval Note:  05/07/2021 9:28 AM  [name] [name]  has presented today for surgery, with the diagnosis of pad.  The various methods of treatment have been discussed with the patient and family. After consideration of risks, benefits and other options for treatment, the patient has consented to  Procedure(s): ABDOMINAL AORTOGRAM W/LOWER EXTREMITY (N/A) as a surgical intervention.  The patient's history has been reviewed, patient examined, no change in status, stable for surgery.  I have reviewed the patient's chart and labs.  Questions were answered to the patient's satisfaction.     [name]

## 2021-05-07 NOTE — Op Note (Signed)
DATE OF SERVICE: 05/07/2021  PATIENT:  [name] [name]  75 y.o. male  PRE-OPERATIVE DIAGNOSIS:  Atherosclerosis of native arteries of left lower extremity causing ulceration; history of aortobifemoral bypass  POST-OPERATIVE DIAGNOSIS:  Same  PROCEDURE:   1) US guided right femoral limb access 2) Aortogram 3) left lower extremity angiogram with second order cannulation (21mL total contrast) 4) Conscious sedation (19 minutes)  SURGEON:  Rande Brunt. Lenell Antu, MD  ASSISTANT: none  ANESTHESIA:   local and IV sedation  ESTIMATED BLOOD LOSS: min  LOCAL MEDICATIONS USED:  LIDOCAINE   COUNTS: confirmed correct.  PATIENT DISPOSITION:  PACU - hemodynamically stable.   Delay start of Pharmacological VTE agent (>24hrs) due to surgical blood loss or risk of bleeding: no  INDICATION FOR PROCEDURE: [name] [name] is a 75 y.o. male with ischemic ulceration about the foot. He underwent aortobifemoral bypass in the [place] system in 2014. CT angiogram suggests left femoral occlusion. After careful discussion of risks, benefits, and alternatives the patient was offered angiogram to delineate a distal bypass target. The patient understood and wished to proceed.  OPERATIVE FINDINGS:  Terminal aorta and iliac arteries: Previous aortobifemoral bypass sewn end-to-side proximally.  The graft appears widely patent.  The right internal iliac appears patent.  Left lower extremity: Common femoral artery: Occluded distal to the left limb of the aortobifemoral bypass.  There is retrograde flow into the left external iliac which fills the pelvis via collaterals. Profunda femoris artery: Occluded near its origin.  Reconstitutes early in its course Superficial femoral artery: Occluded near its origin.  Reconstitutes early in its course.  Heavily diseased throughout the thigh. Greatest stenosis >75%. Popliteal artery: 50% short segement stenosis in above knee popliteal artery. Behind knee popliteal artery is patent.  Below knee popliteal artery is patent.  Anterior tibial artery: Patent, but diffuse disease Tibioperoneal trunk: patent Peroneal artery: patent, but diffuse disease Posterior tibial artery: occluded Pedal circulation: poorly evaluated  DESCRIPTION OF PROCEDURE: After identification of the patient in the pre-operative holding area, the patient was transferred to the operating room. The patient was positioned supine on the operating room table. Anesthesia was induced. The groins was prepped and draped in standard fashion. A surgical pause was performed confirming correct patient, procedure, and operative location.  The right groin was anesthetized with subcutaneous injection of 1% lidocaine. Using ultrasound guidance, the right limb of the aortobifemoral bypass was accessed with micropuncture technique. Fluoroscopy was used to confirm cannulation over the femoral head. The 79F sheath was upsized to 7F.   A Benson wire was advanced into the distal aorta. Over the wire an omni flush catheter was advanced to the level of L2. Aortogram was performed - see above for details.   The left limb of the aortofemoral bypass was  selected with a Benson guidewire. The wire was advanced into the limb of the bypass. Over the wire the omni flush catheter was advanced. Selective angiography was performed - see above for details.   Conscious sedation was administered with the use of IV fentanyl and midazolam under continuous physician and nurse monitoring.  Heart rate, blood pressure, and oxygen saturation were continuously monitored.  Total sedation time was 19 minutes  Upon completion of the case instrument and sharps counts were confirmed correct. The patient was transferred to the PACU in good condition. I was present for all portions of the procedure.  PLAN: femoral endarterectomy and profundaplasty +/- femoral-popliteal bypass in near future with Dr. Chestine Spore.  Rande Brunt. Lenell Antu, MD  [facility] Phone Number: [phone] 05/07/2021 9:28 AM

## 2021-05-07 NOTE — Progress Notes (Signed)
Site area: Right groin a 5 french artwerial sheath was removed  Site Prior to Removal:  Level 0  Pressure Applied For 20 MINUTES    Bedrest Beginning at 1015a X 4  hours  Manual:   Yes.    Patient Status During Pull:  stabnle  Post Pull Groin Site:  Level 0  Post Pull Instructions Given:  Yes.    Post Pull Pulses Present:  Yes.    Dressing Applied:  Yes.    Comments:

## 2021-05-08 ENCOUNTER — Encounter: Payer: Self-pay | Admitting: Internal Medicine

## 2021-05-08 NOTE — Progress Notes (Signed)
PERIOPERATIVE PRESCRIPTION FOR IMPLANTED CARDIAC DEVICE PROGRAMMING  Patient Information: Name:  [name] [name]  DOB:  [birth year]  MRN:  [id number]    Gleason, Ginger E, RN  P [facility] Device Cc: Forte, Lindsi R, RN Planned Procedure:  Left femoral endarterectomy and possible left fenoral to popliteal artery bypass grafting  Surgeon:  Sherald Hess  Date of Procedure:  05/13/21  Cautery will be used.  Position during surgery:  supine   Please send documentation back to:  [facility] (Fax # [phone])   Gleason, Gretta Cool, RN  05/08/2021 3:57 PM  Device Information:  Clinic EP Physician:  Lewayne Bunting, MD   Device Type:  Defibrillator Manufacturer and Phone #:  [employer]: [phone] Pacemaker Dependent?:  No. Date of Last Device Check:  04/28/21 Normal Device Function?:  Yes.    Electrophysiologist's Recommendations:  Have magnet available. Provide continuous ECG monitoring when magnet is used or reprogramming is to be performed.  Procedure should not interfere with device function.  No device programming or magnet placement needed.  Per Device Clinic Standing Orders, Linton Ham, RN  5:19 PM 05/08/2021

## 2021-05-08 NOTE — Progress Notes (Signed)
Surgical Instructions    Your procedure is scheduled on Wednesday August 24th.  Report to [facility] Main Entrance "A" at 7:15 A.M., then check in with the Admitting office.  Call this number if you have problems the morning of surgery:  [phone]   If you have any questions prior to your surgery date call [phone]: Open Monday-Friday 8am-4pm    Remember:  Do not eat or drink anything after midnight the night before your surgery     Take these medicines the morning of surgery with A SIP OF WATER  aspirin EC 81 MG tablet carvedilol (COREG) 6.25 MG tablet dicloxacillin (DYNAPEN) 250 MG capsule famotidine (PEPCID) 20 MG tablet  IF NEEDED albuterol (VENTOLIN HFA) 108 (90 Base) MCG/ACT inhaler, please bring with you to the hospital fluticasone (FLONASE) 50 MCG/ACT nasal spray nitroGLYCERIN (NITROSTAT) 0.4 MG SL tablet  As of today, STOP taking any Aspirin (unless otherwise instructed by your surgeon) Aleve, Naproxen, Ibuprofen, Motrin, Advil, Goody's, BC's, all herbal medications, fish oil, and all vitamins.  WHAT DO I DO ABOUT MY DIABETES MEDICATION?   Do not take oral diabetes medicines (metformin or glipizide) the morning of surgery.       Do not take glipizide the evening before surgery.  T HOW TO MANAGE YOUR DIABETES BEFORE AND AFTER SURGERY  Why is it important to control my blood sugar before and after surgery? Improving blood sugar levels before and after surgery helps healing and can limit problems. A way of improving blood sugar control is eating a healthy diet by:  Eating less sugar and carbohydrates  Increasing activity/exercise  Talking with your doctor about reaching your blood sugar goals High blood sugars (greater than 180 mg/dL) can raise your risk of infections and slow your recovery, so you will need to focus on controlling your diabetes during the weeks before surgery. Make sure that the doctor who takes care of your diabetes knows about your planned  surgery including the date and location.  How do I manage my blood sugar before surgery? Check your blood sugar at least 4 times a day, starting 2 days before surgery, to make sure that the level is not too high or low.  Check your blood sugar the morning of your surgery when you wake up and every 2 hours until you get to the Short Stay unit.  If your blood sugar is less than 70 mg/dL, you will need to treat for low blood sugar: Do not take insulin. Treat a low blood sugar (less than 70 mg/dL) with  cup of clear juice (cranberry or apple), 4 glucose tablets, OR glucose gel. Recheck blood sugar in 15 minutes after treatment (to make sure it is greater than 70 mg/dL). If your blood sugar is not greater than 70 mg/dL on recheck, call [phone] for further instructions. Report your blood sugar to the short stay nurse when you get to Short Stay.  If you are admitted to the hospital after surgery: Your blood sugar will be checked by the staff and you will probably be given insulin after surgery (instead of oral diabetes medicines) to make sure you have good blood sugar levels. The goal for blood sugar control after surgery is 80-180 mg/dL.           Do not wear jewelry or makeup Do not wear lotions, powders, colognes, or deodorant. Do not shave 48 hours prior to surgery.  Men may shave face and neck. Do not bring valuables to the hospital. DO Not  wear nail polish, gel polish, artificial nails, or any other type of covering on  natural nails including finger and toenails. If patients have artificial nails, gel coating, etc. that need to be removed by a nail salon please have this removed prior to surgery or surgery may need to be canceled/delayed if the surgeon/ anesthesia feels like the patient is unable to be adequately monitored.             [facility] is not responsible for any belongings or valuables.  Do NOT Smoke (Tobacco/Vaping) or drink Alcohol 24 hours prior to your procedure If  you use a CPAP at night, you may bring all equipment for your overnight stay.   Contacts, glasses, dentures or bridgework may not be worn into surgery, please bring cases for these belongings   For patients admitted to the hospital, discharge time will be determined by your treatment team.   Patients discharged the day of surgery will not be allowed to drive home, and someone needs to stay with them for 24 hours.  ONLY 1 SUPPORT PERSON MAY BE PRESENT WHILE YOU ARE IN SURGERY. IF YOU ARE TO BE ADMITTED ONCE YOU ARE IN YOUR ROOM YOU WILL BE ALLOWED TWO (2) VISITORS.  Minor children may have two parents present. Special consideration for safety and communication needs will be reviewed on a case by case basis.  Special instructions:    Oral Hygiene is also important to reduce your risk of infection.  Remember - BRUSH YOUR TEETH THE MORNING OF SURGERY WITH YOUR REGULAR TOOTHPASTE   [facility]- Preparing For Surgery  Before surgery, you can play an important role. Because skin is not sterile, your skin needs to be as free of germs as possible. You can reduce the number of germs on your skin by washing with CHG (chlorahexidine gluconate) Soap before surgery.  CHG is an antiseptic cleaner which kills germs and bonds with the skin to continue killing germs even after washing.     Please do not use if you have an allergy to CHG or antibacterial soaps. If your skin becomes reddened/irritated stop using the CHG.  Do not shave (including legs and underarms) for at least 48 hours prior to first CHG shower. It is OK to shave your face.  Please follow these instructions carefully.     Shower the NIGHT BEFORE SURGERY and the MORNING OF SURGERY with CHG Soap.   If you chose to wash your hair, wash your hair first as usual with your normal shampoo. After you shampoo, rinse your hair and body thoroughly to remove the shampoo.  Then [employer] and genitals (private parts) with your normal soap and rinse  thoroughly to remove soap.  After that Use CHG Soap as you would any other liquid soap. You can apply CHG directly to the skin and wash gently with a scrungie or a clean washcloth.   Apply the CHG Soap to your body ONLY FROM THE NECK DOWN.  Do not use on open wounds or open sores. Avoid contact with your eyes, ears, mouth and genitals (private parts). Wash Face and genitals (private parts)  with your normal soap.   Wash thoroughly, paying special attention to the area where your surgery will be performed.  Thoroughly rinse your body with warm water from the neck down.  DO NOT shower/wash with your normal soap after using and rinsing off the CHG Soap.  Pat yourself dry with a CLEAN TOWEL.  Wear CLEAN PAJAMAS to bed  the night before surgery  Place CLEAN SHEETS on your bed the night before your surgery  DO NOT SLEEP WITH PETS.   Day of Surgery:  Take a shower with CHG soap. Wear Clean/Comfortable clothing the morning of surgery Do not apply any deodorants/lotions.   Remember to brush your teeth WITH YOUR REGULAR TOOTHPASTE.   Please read over the following fact sheets that you were given.

## 2021-05-08 NOTE — Progress Notes (Addendum)
Called [employer] to notify representative Kerry Fort of patient's upcoming procedure on 05/13/21.  Left message with details on surgery time, date, and patient name. Awaiting call back.   1554 [name] [name] called back and given additional information on type of procedure.  He stated there was nothing recommended to be done and would be around day of surgery if needed.   1600 Sent staff message to [facility] for device orders, Lindsi cc'd.

## 2021-05-11 ENCOUNTER — Encounter (HOSPITAL_COMMUNITY): Payer: Self-pay

## 2021-05-11 ENCOUNTER — Other Ambulatory Visit: Payer: Self-pay

## 2021-05-11 ENCOUNTER — Encounter (HOSPITAL_COMMUNITY)
Admission: RE | Admit: 2021-05-11 | Discharge: 2021-05-11 | Disposition: A | Discharge status: Home / Self Care | Payer: Medicare Other | Source: Ambulatory Visit | Attending: Vascular Surgery | Admitting: Vascular Surgery

## 2021-05-11 DIAGNOSIS — I255 Ischemic cardiomyopathy: Secondary | ICD-10-CM | POA: Insufficient documentation

## 2021-05-11 DIAGNOSIS — Z951 Presence of aortocoronary bypass graft: Secondary | ICD-10-CM | POA: Insufficient documentation

## 2021-05-11 DIAGNOSIS — J449 Chronic obstructive pulmonary disease, unspecified: Secondary | ICD-10-CM | POA: Insufficient documentation

## 2021-05-11 DIAGNOSIS — Z20822 Contact with and (suspected) exposure to covid-19: Secondary | ICD-10-CM | POA: Insufficient documentation

## 2021-05-11 DIAGNOSIS — Z7982 Long term (current) use of aspirin: Secondary | ICD-10-CM | POA: Insufficient documentation

## 2021-05-11 DIAGNOSIS — Z01812 Encounter for preprocedural laboratory examination: Secondary | ICD-10-CM | POA: Insufficient documentation

## 2021-05-11 DIAGNOSIS — Z95810 Presence of automatic (implantable) cardiac defibrillator: Secondary | ICD-10-CM | POA: Insufficient documentation

## 2021-05-11 HISTORY — DX: Polyneuropathy, unspecified: G62.9

## 2021-05-11 HISTORY — DX: Chronic obstructive pulmonary disease, unspecified: J44.9

## 2021-05-11 HISTORY — DX: Anemia, unspecified: D64.9

## 2021-05-11 LAB — SURGICAL PCR SCREEN
MRSA, PCR: NEGATIVE
Staphylococcus aureus: NEGATIVE

## 2021-05-11 LAB — URINALYSIS, ROUTINE W REFLEX MICROSCOPIC
Bilirubin Urine: NEGATIVE
Glucose, UA: NEGATIVE mg/dL
Hgb urine dipstick: NEGATIVE
Ketones, ur: NEGATIVE mg/dL
Leukocytes,Ua: NEGATIVE
Nitrite: NEGATIVE
Protein, ur: NEGATIVE mg/dL
Specific Gravity, Urine: 1.005 (ref 1.005–1.030)
pH: 8 (ref 5.0–8.0)

## 2021-05-11 LAB — COMPREHENSIVE METABOLIC PANEL
ALT: 12 U/L (ref 0–44)
AST: 16 U/L (ref 15–41)
Albumin: 3.8 g/dL (ref 3.5–5.0)
Alkaline Phosphatase: 50 U/L (ref 38–126)
Anion gap: 10 (ref 5–15)
BUN: 7 mg/dL — ABNORMAL LOW (ref 8–23)
CO2: 21 mmol/L — ABNORMAL LOW (ref 22–32)
Calcium: 8.9 mg/dL (ref 8.9–10.3)
Chloride: 98 mmol/L (ref 98–111)
Creatinine, Ser: 0.82 mg/dL (ref 0.61–1.24)
GFR, Estimated: >60 mL/min (ref >60)
Glucose, Bld: 175 mg/dL — ABNORMAL HIGH (ref 70–99)
Potassium: 4.4 mmol/L (ref 3.5–5.1)
Sodium: 129 mmol/L — ABNORMAL LOW (ref 135–145)
Total Bilirubin: 0.6 mg/dL (ref 0.3–1.2)
Total Protein: 6.1 g/dL — ABNORMAL LOW (ref 6.5–8.1)

## 2021-05-11 LAB — BLOOD GAS, ARTERIAL
Acid-base deficit: 0 mmol/L (ref 0.0–2.0)
Bicarbonate: 23.9 mmol/L (ref 20.0–28.0)
FIO2: 21
O2 Saturation: 95.8 %
Patient temperature: 37
pCO2 arterial: 37.1 mmHg (ref 32.0–48.0)
pH, Arterial: 7.424 (ref 7.350–7.450)
pO2, Arterial: 76.6 mmHg — ABNORMAL LOW (ref 83.0–108.0)

## 2021-05-11 LAB — CBC
HCT: 38.7 % — ABNORMAL LOW (ref 39.0–52.0)
Hemoglobin: 12.8 g/dL — ABNORMAL LOW (ref 13.0–17.0)
MCH: 27.4 pg (ref 26.0–34.0)
MCHC: 33.1 g/dL (ref 30.0–36.0)
MCV: 82.9 fL (ref 80.0–100.0)
Platelets: 287 10*3/uL (ref 150–400)
RBC: 4.67 MIL/uL (ref 4.22–5.81)
RDW: 17.2 % — ABNORMAL HIGH (ref 11.5–15.5)
WBC: 10 10*3/uL (ref 4.0–10.5)
nRBC: 0 % (ref 0.0–0.2)

## 2021-05-11 LAB — TYPE AND SCREEN
ABO/RH(D): B NEG
Antibody Screen: NEGATIVE

## 2021-05-11 LAB — PROTIME-INR
INR: 1.1 (ref 0.8–1.2)
Prothrombin Time: 14.2 seconds (ref 11.4–15.2)

## 2021-05-11 LAB — GLUCOSE, CAPILLARY: Glucose-Capillary: 166 mg/dL — ABNORMAL HIGH (ref 70–99)

## 2021-05-11 LAB — SARS CORONAVIRUS 2 (TAT 6-24 HRS): SARS Coronavirus 2: NEGATIVE

## 2021-05-11 LAB — APTT: aPTT: 34 seconds (ref 24–36)

## 2021-05-11 NOTE — Progress Notes (Signed)
PCP - Janine Ores Cardiologist - Dr. Herbie Baltimore EP - Dr. Ladona Ridgel - orders received  PPM/ICD - [employer] Device Orders - received, printed and placed in chart Rep Notified - [employer] called per Ginger on 05/08/21  Chest x-ray -  EKG - 01/28/21 Stress Test -  ECHO - 02/05/21 Cardiac Cath - 09/02/2016  Sleep Study - n/a CPAP - n/a  Fasting Blood Sugar - 130s Checks Blood Sugar ___1__ time a Week Per daughter, A1c 7.2 on 04/13/21 at [place] - wife to bring results day of surgery  Blood Thinner Instructions: Aspirin Instructions: patient instructed to continue taking ASA and take on morning of surgery.  Patient states he has not had ASA in over a month because Dr. Herbie Baltimore told patient to hold ASA when taking Naproxen for pain.  ERAS Protcol - order for NPO after midnight night before surgery PRE-SURGERY Ensure or G2-   COVID TEST- tested at PAT appointment   Anesthesia review: yes, Hx of CAD  Patient denies shortness of breath, fever, cough and chest pain at PAT appointment   All instructions explained to the patient, with a verbal understanding of the material. Patient agrees to go over the instructions while at home for a better understanding. Patient also instructed to self quarantine after being tested for COVID-19. The opportunity to ask questions was provided.

## 2021-05-12 NOTE — Progress Notes (Signed)
Spoke with Patient's daughter, [name], to let her know that Dr. Chestine Spore does want the patient to resume his aspirin.  Rene Kocher verbalized understanding and said she would let the patient know.

## 2021-05-12 NOTE — Progress Notes (Signed)
Anesthesia Chart Review:  History of aortobifemoral bypass grafting at the [facility] February 2014 and coronary artery bypass grafting x3 a month later.  He has ischemic cardiomyopathy (EF 25 to 30% by echo May 2022) and has an ICD implanted followed by Dr. Ladona Ridgel.  Patient's primary cardiologist is Dr. Herbie Baltimore.  Last seen by Dr. Herbie Baltimore 12/30/2020 and per note he has not had any anginal symptoms in multiple years, has not required any nitroglycerin.  His HFrEF was classified as NYHA class I-2 symptoms at worst.  Edema was noted to be well controlled with current medications he was noted to have worsening leg fatigue with walking and Doppler studies performed 01/09/2021 revealed right ABI 1.0 and a left of 0.93.  He had patent right and left limb of his bypass graft with a high-frequency signal of the origin of his left SFA.  Dr. Herbie Baltimore referred the patient to Dr. Allyson Sabal for further evaluation of PAD.  CT angiogram was ordered which was done 04/20/2021 and based on result, Dr. Allyson Sabal referred the patient to vascular surgery for intervention.  During that time, patient also started to develop tissue loss in the left lower extremity.  Per Dr. Ophelia Charter note 05/05/2021, this was felt to be a limb threatening situation.  History of COPD maintained on Spiriva and as needed albuterol.  Preop labs reviewed, hyponatremia noted with sodium 129, labs otherwise unremarkable.  Per telephone encounter epic 05/01/2021, patient's PCP had recently noted hyponatremia with sodium of 128.  Dr. Herbie Baltimore had made recommendation to discontinue omeprazole and start famotidine 20 mg daily, hold Lasix x2 days, then restart at 20 mg daily (with additional dose as needed for worsening edema).  EKG 01/28/2021: Atrial sensed ventricular paced rhythm.  Rate of 62.  PFT 10/07/2018: FVC-%Pred-Pre Latest Units: % 86  FEV1-Pre Latest Units: L 2.06  FEV1FVC-%Pred-Pre Latest Units: % 86  TLC % pred Latest Units: % 108  RV % pred  Latest Units: % 151  DLCO unc % pred Latest Units: % 51    TTE 02/05/2021:  1. Left ventricular ejection fraction, by estimation, 25-30%. The left  ventricle has severely decreased function. There is diffuse global  hypokinesis which appears worse in the basal-to-mid anterolateral,  basal-to-mid inferolateral, basal anterior and  basal inferior LV walls. The left ventricular internal cavity size was  mildly dilated. There is mild asymmetric left ventricular hypertrophy of  the basal-septal segment. Left ventricular diastolic parameters are  consistent with Grade I diastolic  dysfunction (impaired relaxation).   2. Right ventricular systolic function is mildly reduced. The right  ventricular size is normal.   3. The mitral valve is normal in structure. Mild mitral valve  regurgitation.   4. 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 There is moderate calcification of the  aortic valve. There is severe thickening of the aortic valve. Aortic valve  regurgitation is mild. Mild to moderate aortic valve  sclerosis/calcification is present, without any  evidence of aortic stenosis.   5. The inferior vena cava is normal in size with greater than 50%  respiratory variability, suggesting right atrial pressure of 3 mmHg.   Comparison(s): Compared to prior echo in 2018, the LVEF appears slightly  improved to 25-30% (previously 20-25%).    Perioperative prescription for implanted cardiac device programming per note 05/08/2021: Device Information:   Clinic EP Physician:  Lewayne Bunting, MD    Device Type:  Defibrillator Manufacturer and Phone #:  [employer]: [phone] Pacemaker Dependent?:  No. Date of Last  Device Check:  04/28/21 Normal Device Function?:  Yes.     Electrophysiologist's Recommendations:   Have magnet available. Provide continuous ECG monitoring when magnet is used or reprogramming is to be performed.  Procedure should not interfere with device function.  No device programming or  magnet placement needed.    Zannie Cove [facility] Short Stay Center/Anesthesiology Phone [phone] 05/12/2021 10:25 AM

## 2021-05-12 NOTE — Anesthesia Preprocedure Evaluation (Addendum)
Anesthesia Evaluation  Patient identified by MRN, date of birth, ID band Patient awake    Reviewed: Allergy & Precautions, NPO status , Patient's Chart, lab work & pertinent test results  History of Anesthesia Complications Negative for: history of anesthetic complications  Airway Mallampati: II  TM Distance: >3 FB Neck ROM: Full    Dental  (+) Edentulous Upper, Dental Advisory Given   Pulmonary neg shortness of breath, COPD,  COPD inhaler, neg recent URI, Current Smoker and Patient abstained from smoking.,    breath sounds clear to auscultation       Cardiovascular hypertension, Pt. on medications and Pt. on home beta blockers (-) angina+ CAD, + Past MI, + CABG, + Peripheral Vascular Disease and +CHF  + Cardiac Defibrillator  Rhythm:Regular  1. Left ventricular ejection fraction, by estimation, 25-30%. The left  ventricle has severely decreased function. There is diffuse global  hypokinesis which appears worse in the basal-to-mid anterolateral,  basal-to-mid inferolateral, basal anterior and  basal inferior LV walls. The left ventricular internal cavity size was  mildly dilated. There is mild asymmetric left ventricular hypertrophy of  the basal-septal segment. Left ventricular diastolic parameters are  consistent with Grade I diastolic  dysfunction (impaired relaxation).  2. Right ventricular systolic function is mildly reduced. The right  ventricular size is normal.  3. The mitral valve is normal in structure. Mild mitral valve  regurgitation.  4. 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 There is moderate calcification of the  aortic valve. There is severe thickening of the aortic valve. Aortic valve  regurgitation is mild. Mild to moderate aortic valve  sclerosis/calcification is present, without any  evidence of aortic stenosis.  5. The inferior vena cava is normal in size with greater than 50%  respiratory variability,  suggesting right atrial pressure of 3 mmHg.   Comparison(s): Compared to prior echo in 2018, the LVEF appears slightly  improved to 25-30% (previously 20-25%).   ? There is severe left ventricular systolic dysfunction. ? LV end diastolic pressure is severely elevated. ? The left ventricular ejection fraction is less than 25% by visual estimate. ? There is no mitral valve regurgitation. ? Prox RCA lesion, 100 %stenosed. ? SVG graft was visualized by angiography. ? Origin to Prox Graft lesion, 50 %stenosed. ? Ost Cx to Prox Cx lesion, 60 %stenosed. ? Ost LM to LM lesion, 60 %stenosed. ? SVG graft was visualized by angiography. ? LIMA graft was visualized by angiography. ? Mid LAD lesion, 99 %stenosed.   1. Severe triple vessel CAD s/p 3V CABG 2. Moderate distal left main stenosis 3. Subtotal occlusion of the mid LAD. Patent LIMA graft to mid LAD 4. Patent Circumflex system. Patent vein graft to the high obtuse marginal. This fills the entire lateral wall Circumflex system and some of the LAD. Of note, there is also antegrade flow into the Circumflex from injection of the left main.  5. The RCA is occluded proximally. The vein graft to the distal RCA is patent. The vein graft is small in caliber with no flow limiting stenoses.  6. Severe LV systolic dysfunction, LVEF=10-15%.  7. Wide complex tachycardia. This began after access in the left radial artery. As discussed in the narrative, we attempted DCCV x 2, bolus of amiodarone x 2, adenosine. This eventually converted to sinus with IV Lidocaine.   Recommendations: Medical management of CAD. EP to see to discuss wide complex tachycardia.     Neuro/Psych PSYCHIATRIC DISORDERS Anxiety  Neuromuscular disease    GI/Hepatic Neg  liver ROS, GERD  ,  Endo/Other  diabetes  Renal/GU negative Renal ROSLab Results      Component                Value               Date                      CREATININE               0.82                05/11/2021                 Musculoskeletal   Abdominal   Peds  Hematology  (+) Blood dyscrasia, anemia , Lab Results      Component                Value               Date                      WBC                      10.0                05/11/2021                HGB                      12.8 (L)            05/11/2021                HCT                      38.7 (L)            05/11/2021                MCV                      82.9                05/11/2021                PLT                      287                 05/11/2021              Anesthesia Other Findings   Reproductive/Obstetrics                            Anesthesia Physical Anesthesia Plan  ASA: 3  Anesthesia Plan: General   Post-op Pain Management:    Induction: Intravenous  PONV Risk Score and Plan: 1 and Ondansetron and Dexamethasone  Airway Management Planned: Oral ETT  Additional Equipment: Arterial line  Intra-op Plan:   Post-operative Plan: Extubation in OR  Informed Consent: I have reviewed the patients History and Physical, chart, labs and discussed the procedure including the risks, benefits and alternatives for the proposed anesthesia with the patient or authorized representative who has indicated his/her understanding and acceptance.     Dental advisory given  Plan Discussed with: CRNA and Anesthesiologist  Anesthesia Plan Comments: (PAT note by Antionette Poles, PA-C: History of aortobifemoral bypass grafting at the [facility] February 2014 and coronary artery bypass grafting x3 a month later.  He has ischemic cardiomyopathy (EF 25 to 30% by echo May 2022) and has an ICD implanted followed by Dr. Ladona Ridgel.  Patient's primary cardiologist is Dr. Herbie Baltimore.  Last seen by Dr. Herbie Baltimore 12/30/2020 and per note he has not had any anginal symptoms in multiple years, has not required any nitroglycerin.  His HFrEF was classified as NYHA class I-2 symptoms at worst.  Edema was noted to  be well controlled with current medications he was noted to have worsening leg fatigue with walking and Doppler studies performed 01/09/2021 revealed right ABI 1.0 and a left of 0.93.  He had patent right and left limb of his bypass graft with a high-frequency signal of the origin of his left SFA.  Dr. Herbie Baltimore referred the patient to Dr. Allyson Sabal for further evaluation of PAD.  CT angiogram was ordered which was done 04/20/2021 and based on result, Dr. Allyson Sabal referred the patient to vascular surgery for intervention.  During that time, patient also started to develop tissue loss in the left lower extremity.  Per Dr. Ophelia Charter note 05/05/2021, this was felt to be a limb threatening situation.  History of COPD maintained on Spiriva and as needed albuterol.  Preop labs reviewed, hyponatremia noted with sodium 129, labs otherwise unremarkable.  Per telephone encounter epic 05/01/2021, patient's PCP had recently noted hyponatremia with sodium of 128.  Dr. Herbie Baltimore had made recommendation to discontinue omeprazole and start famotidine 20 mg daily, hold Lasix x2 days, then restart at 20 mg daily (with additional dose as needed for worsening edema).  EKG 01/28/2021: Atrial sensed ventricular paced rhythm.  Rate of 62.  PFT 10/07/2018: FVC-%Pred-Pre Latest Units: % 86 FEV1-Pre Latest Units: L 2.06 FEV1FVC-%Pred-Pre Latest Units: % 86 TLC % pred Latest Units: % 108 RV % pred Latest Units: % 151 DLCO unc % pred Latest Units: % 51   TTE 02/05/2021: 1. Left ventricular ejection fraction, by estimation, 25-30%. The left  ventricle has severely decreased function. There is diffuse global  hypokinesis which appears worse in the basal-to-mid anterolateral,  basal-to-mid inferolateral, basal anterior and  basal inferior LV walls. The left ventricular internal cavity size was  mildly dilated. There is mild asymmetric left ventricular hypertrophy of  the basal-septal segment. Left ventricular diastolic parameters are   consistent with Grade I diastolic  dysfunction (impaired relaxation).  2. Right ventricular systolic function is mildly reduced. The right  ventricular size is normal.  3. The mitral valve is normal in structure. Mild mitral valve  regurgitation.  4. 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 There is moderate calcification of the  aortic valve. There is severe thickening of the aortic valve. Aortic valve  regurgitation is mild. Mild to moderate aortic valve  sclerosis/calcification is present, without any  evidence of aortic stenosis.  5. The inferior vena cava is normal in size with greater than 50%  respiratory variability, suggesting right atrial pressure of 3 mmHg.   Comparison(s): Compared to prior echo in 2018, the LVEF appears slightly  improved to 25-30% (previously 20-25%).    Perioperative prescription for implanted cardiac device programming per note 05/08/2021: Device Information:  Clinic EP Physician:Gregg Ladona Ridgel, MD  Device Type:Defibrillator Manufacturer and Phone #:St. Jude/[employer]: [phone] Pacemaker Dependent?:No. Date of Last Device Check:8/9/22Normal Device Function?:Yes.  Electrophysiologist's Recommendations:  . Have magnet available. . Provide continuous ECG  monitoring when magnet is used or reprogramming is to be performed. . Procedure should not interfere with device function. No device programming or magnet placement needed.  )       Anesthesia Quick Evaluation

## 2021-05-13 ENCOUNTER — Inpatient Hospital Stay (HOSPITAL_COMMUNITY): Payer: Medicare Other | Admitting: Physician Assistant

## 2021-05-13 ENCOUNTER — Inpatient Hospital Stay (HOSPITAL_COMMUNITY)
Admission: RE | Admit: 2021-05-13 | Discharge: 2021-05-15 | DRG: 253 | Disposition: A | Discharge status: Home / Self Care | Payer: Medicare Other | Attending: Vascular Surgery | Admitting: Vascular Surgery

## 2021-05-13 ENCOUNTER — Inpatient Hospital Stay (HOSPITAL_COMMUNITY): Payer: Medicare Other

## 2021-05-13 ENCOUNTER — Encounter (HOSPITAL_COMMUNITY)
Admission: RE | Disposition: A | Discharge status: Home / Self Care | Payer: Self-pay | Source: Home / Self Care | Attending: Vascular Surgery

## 2021-05-13 ENCOUNTER — Ambulatory Visit: Payer: Medicare Other

## 2021-05-13 ENCOUNTER — Encounter (HOSPITAL_COMMUNITY): Payer: Self-pay | Admitting: Vascular Surgery

## 2021-05-13 ENCOUNTER — Other Ambulatory Visit: Payer: Self-pay

## 2021-05-13 ENCOUNTER — Inpatient Hospital Stay (HOSPITAL_COMMUNITY): Payer: Medicare Other | Admitting: Certified Registered Nurse Anesthetist

## 2021-05-13 DIAGNOSIS — I480 Paroxysmal atrial fibrillation: Secondary | ICD-10-CM | POA: Diagnosis not present

## 2021-05-13 DIAGNOSIS — Z8711 Personal history of peptic ulcer disease: Secondary | ICD-10-CM | POA: Diagnosis not present

## 2021-05-13 DIAGNOSIS — Z7982 Long term (current) use of aspirin: Secondary | ICD-10-CM | POA: Diagnosis not present

## 2021-05-13 DIAGNOSIS — Z01812 Encounter for preprocedural laboratory examination: Secondary | ICD-10-CM

## 2021-05-13 DIAGNOSIS — J449 Chronic obstructive pulmonary disease, unspecified: Secondary | ICD-10-CM | POA: Diagnosis present

## 2021-05-13 DIAGNOSIS — E1140 Type 2 diabetes mellitus with diabetic neuropathy, unspecified: Secondary | ICD-10-CM | POA: Diagnosis not present

## 2021-05-13 DIAGNOSIS — E785 Hyperlipidemia, unspecified: Secondary | ICD-10-CM | POA: Diagnosis present

## 2021-05-13 DIAGNOSIS — I252 Old myocardial infarction: Secondary | ICD-10-CM

## 2021-05-13 DIAGNOSIS — I70312 Atherosclerosis of unspecified type of bypass graft(s) of the extremities with intermittent claudication, left leg: Secondary | ICD-10-CM | POA: Diagnosis present

## 2021-05-13 DIAGNOSIS — Z419 Encounter for procedure for purposes other than remedying health state, unspecified: Secondary | ICD-10-CM

## 2021-05-13 DIAGNOSIS — Z800 Family history of malignant neoplasm of digestive organs: Secondary | ICD-10-CM | POA: Diagnosis not present

## 2021-05-13 DIAGNOSIS — I5022 Chronic systolic (congestive) heart failure: Secondary | ICD-10-CM | POA: Diagnosis present

## 2021-05-13 DIAGNOSIS — Z951 Presence of aortocoronary bypass graft: Secondary | ICD-10-CM

## 2021-05-13 DIAGNOSIS — K219 Gastro-esophageal reflux disease without esophagitis: Secondary | ICD-10-CM | POA: Diagnosis present

## 2021-05-13 DIAGNOSIS — I2510 Atherosclerotic heart disease of native coronary artery without angina pectoris: Secondary | ICD-10-CM | POA: Diagnosis present

## 2021-05-13 DIAGNOSIS — L97429 Non-pressure chronic ulcer of left heel and midfoot with unspecified severity: Secondary | ICD-10-CM | POA: Diagnosis present

## 2021-05-13 DIAGNOSIS — I110 Hypertensive heart disease with heart failure: Secondary | ICD-10-CM | POA: Diagnosis present

## 2021-05-13 DIAGNOSIS — I70222 Atherosclerosis of native arteries of extremities with rest pain, left leg: Secondary | ICD-10-CM | POA: Diagnosis not present

## 2021-05-13 DIAGNOSIS — Z95810 Presence of automatic (implantable) cardiac defibrillator: Secondary | ICD-10-CM | POA: Diagnosis not present

## 2021-05-13 DIAGNOSIS — I7092 Chronic total occlusion of artery of the extremities: Secondary | ICD-10-CM | POA: Diagnosis not present

## 2021-05-13 DIAGNOSIS — Z20822 Contact with and (suspected) exposure to covid-19: Secondary | ICD-10-CM | POA: Diagnosis present

## 2021-05-13 DIAGNOSIS — I25119 Atherosclerotic heart disease of native coronary artery with unspecified angina pectoris: Secondary | ICD-10-CM | POA: Diagnosis present

## 2021-05-13 DIAGNOSIS — E1169 Type 2 diabetes mellitus with other specified complication: Secondary | ICD-10-CM | POA: Diagnosis present

## 2021-05-13 DIAGNOSIS — I255 Ischemic cardiomyopathy: Secondary | ICD-10-CM | POA: Diagnosis present

## 2021-05-13 DIAGNOSIS — I739 Peripheral vascular disease, unspecified: Secondary | ICD-10-CM | POA: Diagnosis present

## 2021-05-13 DIAGNOSIS — E11621 Type 2 diabetes mellitus with foot ulcer: Secondary | ICD-10-CM | POA: Diagnosis present

## 2021-05-13 DIAGNOSIS — E1151 Type 2 diabetes mellitus with diabetic peripheral angiopathy without gangrene: Secondary | ICD-10-CM | POA: Diagnosis present

## 2021-05-13 HISTORY — PX: INTRAOPERATIVE ARTERIOGRAM: SHX5157

## 2021-05-13 HISTORY — PX: ENDARTERECTOMY FEMORAL: SHX5804

## 2021-05-13 HISTORY — PX: INSERTION OF ILIAC STENT: SHX6256

## 2021-05-13 LAB — CBC
HCT: 37.2 % — ABNORMAL LOW (ref 39.0–52.0)
Hemoglobin: 12.2 g/dL — ABNORMAL LOW (ref 13.0–17.0)
MCH: 27.3 pg (ref 26.0–34.0)
MCHC: 32.8 g/dL (ref 30.0–36.0)
MCV: 83.2 fL (ref 80.0–100.0)
Platelets: 267 10*3/uL (ref 150–400)
RBC: 4.47 MIL/uL (ref 4.22–5.81)
RDW: 16.9 % — ABNORMAL HIGH (ref 11.5–15.5)
WBC: 15.4 10*3/uL — ABNORMAL HIGH (ref 4.0–10.5)
nRBC: 0 % (ref 0.0–0.2)

## 2021-05-13 LAB — CREATININE, SERUM
Creatinine, Ser: 0.93 mg/dL (ref 0.61–1.24)
GFR, Estimated: >60 mL/min (ref >60)

## 2021-05-13 LAB — HEPATIC FUNCTION PANEL
ALT: 11 U/L (ref 0–44)
AST: 16 U/L (ref 15–41)
Albumin: 3.6 g/dL (ref 3.5–5.0)
Alkaline Phosphatase: 46 U/L (ref 38–126)
Bilirubin, Direct: <0.1 mg/dL (ref 0.0–0.2)
Total Bilirubin: 0.4 mg/dL (ref 0.3–1.2)
Total Protein: 5.9 g/dL — ABNORMAL LOW (ref 6.5–8.1)

## 2021-05-13 LAB — GLUCOSE, CAPILLARY
Glucose-Capillary: 169 mg/dL — ABNORMAL HIGH (ref 70–99)
Glucose-Capillary: 177 mg/dL — ABNORMAL HIGH (ref 70–99)
Glucose-Capillary: 178 mg/dL — ABNORMAL HIGH (ref 70–99)
Glucose-Capillary: 186 mg/dL — ABNORMAL HIGH (ref 70–99)
Glucose-Capillary: 192 mg/dL — ABNORMAL HIGH (ref 70–99)

## 2021-05-13 LAB — ABO/RH: ABO/RH(D): B NEG

## 2021-05-13 SURGERY — ENDARTERECTOMY, FEMORAL
Anesthesia: General | Site: Leg Upper | Laterality: Left

## 2021-05-13 MED ORDER — HEPARIN SODIUM (PORCINE) 5000 UNIT/ML IJ SOLN
5000.0000 [IU] | Freq: Three times a day (TID) | INTRAMUSCULAR | Status: DC
  Administered 2021-05-14 – 2021-05-15 (×4): 5000 [IU] via SUBCUTANEOUS
  Filled 2021-05-13 (×3): qty 1

## 2021-05-13 MED ORDER — CHLORHEXIDINE GLUCONATE CLOTH 2 % EX PADS
6.0000 | MEDICATED_PAD | Freq: Once | CUTANEOUS | Status: DC
Start: 2021-05-13 — End: 2021-05-13

## 2021-05-13 MED ORDER — 0.9 % SODIUM CHLORIDE (POUR BTL) OPTIME
TOPICAL | Status: DC | PRN
  Administered 2021-05-13: 2000 mL

## 2021-05-13 MED ORDER — PROPOFOL 10 MG/ML IV BOLUS
INTRAVENOUS | Status: AC
  Filled 2021-05-13: qty 20

## 2021-05-13 MED ORDER — GUAIFENESIN-DM 100-10 MG/5ML PO SYRP: 15.0000 mL | ORAL_SOLUTION | ORAL | Status: DC | PRN

## 2021-05-13 MED ORDER — LIDOCAINE 2% (20 MG/ML) 5 ML SYRINGE
INTRAMUSCULAR | Status: AC
  Filled 2021-05-13: qty 5

## 2021-05-13 MED ORDER — HYDRALAZINE HCL 20 MG/ML IJ SOLN
5.0000 mg | INTRAMUSCULAR | Status: DC | PRN
Start: 2021-05-13 — End: 2021-05-15

## 2021-05-13 MED ORDER — FENTANYL CITRATE (PF) 250 MCG/5ML IJ SOLN
INTRAMUSCULAR | Status: AC
  Filled 2021-05-13: qty 5

## 2021-05-13 MED ORDER — HEPARIN SODIUM (PORCINE) 1000 UNIT/ML IJ SOLN
INTRAMUSCULAR | Status: AC
  Filled 2021-05-13: qty 1

## 2021-05-13 MED ORDER — ORAL CARE MOUTH RINSE: 15.0000 mL | Freq: Once | OROMUCOSAL | Status: AC

## 2021-05-13 MED ORDER — DOCUSATE SODIUM 100 MG PO CAPS
100.0000 mg | ORAL_CAPSULE | Freq: Every day | ORAL | Status: DC
  Administered 2021-05-14: 100 mg via ORAL
  Filled 2021-05-13 (×2): qty 1

## 2021-05-13 MED ORDER — MAGNESIUM SULFATE 2 GM/50ML IV SOLN: 2.0000 g | Freq: Every day | INTRAVENOUS | Status: DC | PRN

## 2021-05-13 MED ORDER — ETOMIDATE 2 MG/ML IV SOLN
INTRAVENOUS | Status: AC
  Filled 2021-05-13: qty 10

## 2021-05-13 MED ORDER — SUGAMMADEX SODIUM 200 MG/2ML IV SOLN
INTRAVENOUS | Status: DC | PRN
  Administered 2021-05-13: 200 mg via INTRAVENOUS

## 2021-05-13 MED ORDER — ROCURONIUM BROMIDE 10 MG/ML (PF) SYRINGE
PREFILLED_SYRINGE | INTRAVENOUS | Status: AC
  Filled 2021-05-13: qty 10

## 2021-05-13 MED ORDER — IODIXANOL 320 MG/ML IV SOLN
INTRAVENOUS | Status: DC | PRN
  Administered 2021-05-13 (×2): 50 mL

## 2021-05-13 MED ORDER — OXYCODONE-ACETAMINOPHEN 5-325 MG PO TABS
1.0000 | ORAL_TABLET | ORAL | Status: DC | PRN
  Administered 2021-05-13 – 2021-05-14 (×2): 1 via ORAL
  Administered 2021-05-14 – 2021-05-15 (×4): 2 via ORAL
  Filled 2021-05-13 (×3): qty 2
  Filled 2021-05-13: qty 1
  Filled 2021-05-13: qty 2
  Filled 2021-05-13: qty 1

## 2021-05-13 MED ORDER — ALBUMIN HUMAN 5 % IV SOLN: INTRAVENOUS | Status: DC | PRN

## 2021-05-13 MED ORDER — CARVEDILOL 6.25 MG PO TABS
6.2500 mg | ORAL_TABLET | Freq: Two times a day (BID) | ORAL | Status: DC
  Administered 2021-05-13 – 2021-05-15 (×4): 6.25 mg via ORAL
  Filled 2021-05-13 (×4): qty 1

## 2021-05-13 MED ORDER — SODIUM CHLORIDE 0.9 % IV SOLN: 500.0000 mL | Freq: Once | INTRAVENOUS | Status: DC | PRN

## 2021-05-13 MED ORDER — SODIUM CHLORIDE 0.9 % IV SOLN: INTRAVENOUS | Status: DC

## 2021-05-13 MED ORDER — FENTANYL CITRATE (PF) 100 MCG/2ML IJ SOLN
INTRAMUSCULAR | Status: DC | PRN
  Administered 2021-05-13 (×2): 50 ug via INTRAVENOUS
  Administered 2021-05-13: 100 ug via INTRAVENOUS
  Administered 2021-05-13: 50 ug via INTRAVENOUS

## 2021-05-13 MED ORDER — ACETAMINOPHEN 10 MG/ML IV SOLN: 1000.0000 mg | Freq: Once | INTRAVENOUS | Status: DC | PRN

## 2021-05-13 MED ORDER — DEXAMETHASONE SODIUM PHOSPHATE 10 MG/ML IJ SOLN
INTRAMUSCULAR | Status: DC | PRN
  Administered 2021-05-13: 5 mg via INTRAVENOUS

## 2021-05-13 MED ORDER — METOPROLOL TARTRATE 5 MG/5ML IV SOLN
2.0000 mg | INTRAVENOUS | Status: DC | PRN
  Filled 2021-05-13: qty 5

## 2021-05-13 MED ORDER — LABETALOL HCL 5 MG/ML IV SOLN: 10.0000 mg | INTRAVENOUS | Status: DC | PRN

## 2021-05-13 MED ORDER — HEPARIN SODIUM (PORCINE) 1000 UNIT/ML IJ SOLN
INTRAMUSCULAR | Status: DC | PRN
  Administered 2021-05-13: 7000 [IU] via INTRAVENOUS
  Administered 2021-05-13: 2000 [IU] via INTRAVENOUS
  Administered 2021-05-13: 3000 [IU] via INTRAVENOUS

## 2021-05-13 MED ORDER — ACETAMINOPHEN 325 MG PO TABS: 325.0000 mg | ORAL_TABLET | ORAL | Status: DC | PRN

## 2021-05-13 MED ORDER — LACTATED RINGERS IV SOLN: INTRAVENOUS | Status: DC | PRN

## 2021-05-13 MED ORDER — BISACODYL 5 MG PO TBEC: 5.0000 mg | DELAYED_RELEASE_TABLET | Freq: Every day | ORAL | Status: DC | PRN

## 2021-05-13 MED ORDER — ASPIRIN EC 81 MG PO TBEC
81.0000 mg | DELAYED_RELEASE_TABLET | Freq: Every day | ORAL | Status: DC
  Administered 2021-05-14 – 2021-05-15 (×2): 81 mg via ORAL
  Filled 2021-05-13 (×2): qty 1

## 2021-05-13 MED ORDER — SODIUM CHLORIDE 0.9 % IV SOLN
INTRAVENOUS | Status: DC | PRN
  Administered 2021-05-13: 50 ug/min via INTRAVENOUS

## 2021-05-13 MED ORDER — ALUM & MAG HYDROXIDE-SIMETH 200-200-20 MG/5ML PO SUSP: 15.0000 mL | ORAL | Status: DC | PRN

## 2021-05-13 MED ORDER — UMECLIDINIUM BROMIDE 62.5 MCG/INH IN AEPB
2.0000 | INHALATION_SPRAY | Freq: Every day | RESPIRATORY_TRACT | Status: DC
  Filled 2021-05-13: qty 7

## 2021-05-13 MED ORDER — OXYCODONE HCL 5 MG/5ML PO SOLN: 5.0000 mg | Freq: Once | ORAL | Status: DC | PRN

## 2021-05-13 MED ORDER — CLOPIDOGREL BISULFATE 75 MG PO TABS
75.0000 mg | ORAL_TABLET | Freq: Every day | ORAL | Status: DC
  Administered 2021-05-14 – 2021-05-15 (×2): 75 mg via ORAL
  Filled 2021-05-13 (×2): qty 1

## 2021-05-13 MED ORDER — LIDOCAINE 2% (20 MG/ML) 5 ML SYRINGE
INTRAMUSCULAR | Status: DC | PRN
  Administered 2021-05-13: 80 mg via INTRAVENOUS

## 2021-05-13 MED ORDER — MORPHINE SULFATE (PF) 2 MG/ML IV SOLN
2.0000 mg | INTRAVENOUS | Status: DC | PRN
  Administered 2021-05-13 – 2021-05-14 (×2): 2 mg via INTRAVENOUS
  Filled 2021-05-13 (×2): qty 1

## 2021-05-13 MED ORDER — ACETAMINOPHEN 650 MG RE SUPP: 325.0000 mg | RECTAL | Status: DC | PRN

## 2021-05-13 MED ORDER — OXYCODONE HCL 5 MG PO TABS: 5.0000 mg | ORAL_TABLET | Freq: Once | ORAL | Status: DC | PRN

## 2021-05-13 MED ORDER — IRBESARTAN 150 MG PO TABS
75.0000 mg | ORAL_TABLET | Freq: Every day | ORAL | Status: DC
  Administered 2021-05-13 – 2021-05-15 (×3): 75 mg via ORAL
  Filled 2021-05-13 (×3): qty 1

## 2021-05-13 MED ORDER — ALBUTEROL SULFATE (2.5 MG/3ML) 0.083% IN NEBU: 3.0000 mL | INHALATION_SOLUTION | Freq: Four times a day (QID) | RESPIRATORY_TRACT | Status: DC | PRN

## 2021-05-13 MED ORDER — TIOTROPIUM BROMIDE MONOHYDRATE 18 MCG IN CAPS
18.0000 ug | ORAL_CAPSULE | Freq: Every day | RESPIRATORY_TRACT | Status: DC
  Administered 2021-05-13: 18 ug via RESPIRATORY_TRACT
  Filled 2021-05-13: qty 5

## 2021-05-13 MED ORDER — GLIPIZIDE 5 MG PO TABS
5.0000 mg | ORAL_TABLET | Freq: Two times a day (BID) | ORAL | Status: DC
  Administered 2021-05-14 – 2021-05-15 (×3): 5 mg via ORAL
  Filled 2021-05-13 (×3): qty 1

## 2021-05-13 MED ORDER — DICLOXACILLIN SODIUM 250 MG PO CAPS
500.0000 mg | ORAL_CAPSULE | Freq: Three times a day (TID) | ORAL | Status: DC
  Administered 2021-05-14 – 2021-05-15 (×2): 500 mg via ORAL
  Filled 2021-05-13: qty 2
  Filled 2021-05-13: qty 1
  Filled 2021-05-13: qty 2
  Filled 2021-05-13: qty 1
  Filled 2021-05-13 (×2): qty 2

## 2021-05-13 MED ORDER — HEPARIN 6000 UNIT IRRIGATION SOLUTION
Status: AC
  Filled 2021-05-13: qty 500

## 2021-05-13 MED ORDER — PAPAVERINE HCL 30 MG/ML IJ SOLN
INTRAMUSCULAR | Status: AC
  Filled 2021-05-13: qty 2

## 2021-05-13 MED ORDER — SENNOSIDES-DOCUSATE SODIUM 8.6-50 MG PO TABS: 1.0000 | ORAL_TABLET | Freq: Every evening | ORAL | Status: DC | PRN

## 2021-05-13 MED ORDER — CHLORHEXIDINE GLUCONATE 0.12 % MT SOLN
15.0000 mL | Freq: Once | OROMUCOSAL | Status: AC
  Administered 2021-05-13: 15 mL via OROMUCOSAL
  Filled 2021-05-13: qty 15

## 2021-05-13 MED ORDER — ACETAMINOPHEN 500 MG PO TABS: 1000.0000 mg | ORAL_TABLET | Freq: Once | ORAL | Status: DC | PRN

## 2021-05-13 MED ORDER — FERROUS SULFATE 325 (65 FE) MG PO TABS
325.0000 mg | ORAL_TABLET | Freq: Every day | ORAL | Status: DC
  Administered 2021-05-14 – 2021-05-15 (×2): 325 mg via ORAL
  Filled 2021-05-13 (×2): qty 1

## 2021-05-13 MED ORDER — CEFAZOLIN SODIUM-DEXTROSE 2-4 GM/100ML-% IV SOLN
2.0000 g | Freq: Three times a day (TID) | INTRAVENOUS | Status: AC
  Administered 2021-05-13 – 2021-05-14 (×2): 2 g via INTRAVENOUS
  Filled 2021-05-13 (×2): qty 100

## 2021-05-13 MED ORDER — ONDANSETRON HCL 4 MG/2ML IJ SOLN: 4.0000 mg | Freq: Four times a day (QID) | INTRAMUSCULAR | Status: DC | PRN

## 2021-05-13 MED ORDER — PANTOPRAZOLE SODIUM 40 MG PO TBEC
40.0000 mg | DELAYED_RELEASE_TABLET | Freq: Every day | ORAL | Status: DC
  Administered 2021-05-14 – 2021-05-15 (×2): 40 mg via ORAL
  Filled 2021-05-13 (×2): qty 1

## 2021-05-13 MED ORDER — POTASSIUM CHLORIDE CRYS ER 20 MEQ PO TBCR: 20.0000 meq | EXTENDED_RELEASE_TABLET | Freq: Every day | ORAL | Status: DC | PRN

## 2021-05-13 MED ORDER — FUROSEMIDE 20 MG PO TABS
20.0000 mg | ORAL_TABLET | Freq: Every day | ORAL | Status: DC
  Administered 2021-05-14 – 2021-05-15 (×2): 20 mg via ORAL
  Filled 2021-05-13 (×2): qty 1

## 2021-05-13 MED ORDER — DEXAMETHASONE SODIUM PHOSPHATE 10 MG/ML IJ SOLN
INTRAMUSCULAR | Status: AC
  Filled 2021-05-13: qty 1

## 2021-05-13 MED ORDER — ONDANSETRON HCL 4 MG/2ML IJ SOLN
INTRAMUSCULAR | Status: DC | PRN
  Administered 2021-05-13: 4 mg via INTRAVENOUS

## 2021-05-13 MED ORDER — PROPOFOL 10 MG/ML IV BOLUS
INTRAVENOUS | Status: DC | PRN
  Administered 2021-05-13: 50 mg via INTRAVENOUS

## 2021-05-13 MED ORDER — PROTAMINE SULFATE 10 MG/ML IV SOLN
INTRAVENOUS | Status: DC | PRN
  Administered 2021-05-13: 20 mg via INTRAVENOUS
  Administered 2021-05-13: 10 mg via INTRAVENOUS

## 2021-05-13 MED ORDER — LACTATED RINGERS IV SOLN: INTRAVENOUS | Status: DC

## 2021-05-13 MED ORDER — ROCURONIUM BROMIDE 10 MG/ML (PF) SYRINGE
PREFILLED_SYRINGE | INTRAVENOUS | Status: DC | PRN
Start: 2021-05-13 — End: 2021-05-13
  Administered 2021-05-13: 30 mg via INTRAVENOUS
  Administered 2021-05-13: 70 mg via INTRAVENOUS

## 2021-05-13 MED ORDER — CEFAZOLIN SODIUM-DEXTROSE 2-4 GM/100ML-% IV SOLN
2.0000 g | INTRAVENOUS | Status: AC
  Administered 2021-05-13: 2 g via INTRAVENOUS
  Filled 2021-05-13: qty 100

## 2021-05-13 MED ORDER — HEPARIN 6000 UNIT IRRIGATION SOLUTION
Status: DC | PRN
  Administered 2021-05-13: 1

## 2021-05-13 MED ORDER — FAMOTIDINE 20 MG PO TABS
20.0000 mg | ORAL_TABLET | Freq: Two times a day (BID) | ORAL | Status: DC
  Administered 2021-05-13 – 2021-05-15 (×4): 20 mg via ORAL
  Filled 2021-05-13 (×5): qty 1

## 2021-05-13 MED ORDER — METFORMIN HCL 500 MG PO TABS
1000.0000 mg | ORAL_TABLET | Freq: Two times a day (BID) | ORAL | Status: DC
  Administered 2021-05-14 – 2021-05-15 (×3): 1000 mg via ORAL
  Filled 2021-05-13 (×3): qty 2

## 2021-05-13 MED ORDER — ATORVASTATIN CALCIUM 40 MG PO TABS
40.0000 mg | ORAL_TABLET | Freq: Every evening | ORAL | Status: DC
  Administered 2021-05-13 – 2021-05-14 (×2): 40 mg via ORAL
  Filled 2021-05-13: qty 1

## 2021-05-13 MED ORDER — ACETAMINOPHEN 160 MG/5ML PO SOLN: 1000.0000 mg | Freq: Once | ORAL | Status: DC | PRN

## 2021-05-13 MED ORDER — FENTANYL CITRATE (PF) 100 MCG/2ML IJ SOLN: 25.0000 ug | INTRAMUSCULAR | Status: DC | PRN

## 2021-05-13 MED ORDER — PHENOL 1.4 % MT LIQD: 1.0000 | OROMUCOSAL | Status: DC | PRN

## 2021-05-13 SURGICAL SUPPLY — 78 items
ADH SKN CLS APL DERMABOND .7 (GAUZE/BANDAGES/DRESSINGS) ×4
AGENT HMST SPONGE THK3/8 (HEMOSTASIS)
BAG COUNTER SPONGE SURGICOUNT (BAG) ×3 IMPLANT
BAG SPNG CNTER NS LX DISP (BAG) ×2
BALLN MUSTANG 5X100X135 (BALLOONS) ×3
BALLOON MUSTANG 5X100X135 (BALLOONS) ×1 IMPLANT
BANDAGE ESMARK 6X9 LF (GAUZE/BANDAGES/DRESSINGS) IMPLANT
BLADE CLIPPER SURG (BLADE) ×3 IMPLANT
BNDG CMPR 9X6 STRL LF SNTH (GAUZE/BANDAGES/DRESSINGS)
BNDG ESMARK 6X9 LF (GAUZE/BANDAGES/DRESSINGS)
CANISTER SUCT 3000ML PPV (MISCELLANEOUS) ×3 IMPLANT
CATH EMB 3FR 40CM (CATHETERS) ×2 IMPLANT
CLIP FOGARTY SPRING 6M (CLIP) ×1 IMPLANT
CLIP VESOCCLUDE MED 24/CT (CLIP) ×3 IMPLANT
CLIP VESOCCLUDE SM WIDE 24/CT (CLIP) ×3 IMPLANT
COVER PROBE W GEL 5X96 (DRAPES) ×3 IMPLANT
CUFF TOURN SGL QUICK 24 (TOURNIQUET CUFF)
CUFF TOURN SGL QUICK 34 (TOURNIQUET CUFF)
CUFF TOURN SGL QUICK 42 (TOURNIQUET CUFF) IMPLANT
CUFF TRNQT CYL 24X4X16.5-23 (TOURNIQUET CUFF) IMPLANT
CUFF TRNQT CYL 34X4.125X (TOURNIQUET CUFF) IMPLANT
DERMABOND ADVANCED (GAUZE/BANDAGES/DRESSINGS) ×2
DERMABOND ADVANCED .7 DNX12 (GAUZE/BANDAGES/DRESSINGS) ×3 IMPLANT
DRAIN CHANNEL 15F RND FF W/TCR (WOUND CARE) IMPLANT
DRAPE C-ARM 42X72 X-RAY (DRAPES) ×3 IMPLANT
DRAPE HALF SHEET 40X57 (DRAPES) IMPLANT
ELECT REM PT RETURN 9FT ADLT (ELECTROSURGICAL) ×3
ELECTRODE REM PT RTRN 9FT ADLT (ELECTROSURGICAL) ×2 IMPLANT
EVACUATOR SILICONE 100CC (DRAIN) IMPLANT
GLIDEWIRE ADV .035X260CM (WIRE) ×2 IMPLANT
GLOVE SRG 8 PF TXTR STRL LF DI (GLOVE) ×2 IMPLANT
GLOVE SURG ENC MOIS LTX SZ7.5 (GLOVE) ×3 IMPLANT
GLOVE SURG UNDER POLY LF SZ8 (GLOVE) ×3
GOWN STRL REUS W/ TWL LRG LVL3 (GOWN DISPOSABLE) ×6 IMPLANT
GOWN STRL REUS W/ TWL XL LVL3 (GOWN DISPOSABLE) ×4 IMPLANT
GOWN STRL REUS W/TWL LRG LVL3 (GOWN DISPOSABLE) ×9
GOWN STRL REUS W/TWL XL LVL3 (GOWN DISPOSABLE) ×6
GRAFT CV 30X10STRG TUBE (Vascular Products) ×1 IMPLANT
GRAFT HEMASHIELD 10MM (Vascular Products) ×3 IMPLANT
HEMOSTAT SPONGE AVITENE ULTRA (HEMOSTASIS) IMPLANT
INSERT FOGARTY SM (MISCELLANEOUS) IMPLANT
KIT BASIN OR (CUSTOM PROCEDURE TRAY) ×3 IMPLANT
KIT ENCORE 26 ADVANTAGE (KITS) ×2 IMPLANT
KIT MICROPUNCTURE NIT STIFF (SHEATH) ×2 IMPLANT
KIT TURNOVER KIT B (KITS) ×3 IMPLANT
NS IRRIG 1000ML POUR BTL (IV SOLUTION) ×6 IMPLANT
PACK PERIPHERAL VASCULAR (CUSTOM PROCEDURE TRAY) ×3 IMPLANT
PAD ARMBOARD 7.5X6 YLW CONV (MISCELLANEOUS) ×6 IMPLANT
PATCH VASC XENOSURE 1CMX6CM (Vascular Products) ×3 IMPLANT
PATCH VASC XENOSURE 1X6 (Vascular Products) ×1 IMPLANT
SET MICROPUNCTURE 5F STIFF (MISCELLANEOUS) IMPLANT
SHEATH PINNACLE 6F 10CM (SHEATH) ×2 IMPLANT
STENT ELUVIA 6X100X130 (Permanent Stent) ×2 IMPLANT
STENT ELUVIA 6X120X130 (Permanent Stent) ×2 IMPLANT
STENT INNOVA 6X100X130 (Permanent Stent) ×2 IMPLANT
STOPCOCK 4 WAY LG BORE MALE ST (IV SETS) ×2 IMPLANT
SUT ETHILON 3 0 PS 1 (SUTURE) IMPLANT
SUT GORETEX 5 0 TT13 24 (SUTURE) IMPLANT
SUT GORETEX 6.0 TT13 (SUTURE) IMPLANT
SUT MNCRL AB 4-0 PS2 18 (SUTURE) ×7 IMPLANT
SUT PROLENE 5 0 C 1 24 (SUTURE) ×19 IMPLANT
SUT PROLENE 6 0 BV (SUTURE) ×5 IMPLANT
SUT PROLENE 7 0 BV 1 (SUTURE) IMPLANT
SUT SILK 2 0 PERMA HAND 18 BK (SUTURE) IMPLANT
SUT SILK 3 0 (SUTURE)
SUT SILK 3-0 18XBRD TIE 12 (SUTURE) IMPLANT
SUT VIC AB 2-0 CT1 27 (SUTURE) ×3
SUT VIC AB 2-0 CT1 TAPERPNT 27 (SUTURE) ×3 IMPLANT
SUT VIC AB 3-0 SH 27 (SUTURE) ×6
SUT VIC AB 3-0 SH 27X BRD (SUTURE) ×5 IMPLANT
SWAB CULTURE LIQ STUART DBL (MISCELLANEOUS) ×2 IMPLANT
SWAB CULTURE LIQUID MINI MALE (MISCELLANEOUS) ×2 IMPLANT
TAPE UMBILICAL COTTON 1/8X30 (MISCELLANEOUS) IMPLANT
TOWEL GREEN STERILE (TOWEL DISPOSABLE) ×3 IMPLANT
TRAY FOLEY MTR SLVR 16FR STAT (SET/KITS/TRAYS/PACK) ×3 IMPLANT
TUBING EXTENTION W/L.L. (IV SETS) ×2 IMPLANT
UNDERPAD 30X36 HEAVY ABSORB (UNDERPADS AND DIAPERS) ×3 IMPLANT
WATER STERILE IRR 1000ML POUR (IV SOLUTION) ×3 IMPLANT

## 2021-05-13 NOTE — H&P (Signed)
History and Physical Interval Note:  05/13/2021 10:54 AM  [name] [name]  has presented today for surgery, with the diagnosis of PAD.  The various methods of treatment have been discussed with the patient and family. After consideration of risks, benefits and other options for treatment, the patient has consented to  Procedure(s): Left Femoral Endarterectomy (Left) Possible Left Femoral to Popliteal Aretery Bypass Grafting (Left) as a surgical intervention.  The patient's history has been reviewed, patient examined, no change in status, stable for surgery.  I have reviewed the patient's chart and labs.  Questions were answered to the patient's satisfaction.    Left femoral endarterectomy with plan endarterectomized the proximal SFA and profunda.  Cephus Shellinghristopher J Deshay Kirstein  Patient name: [name] LiesGeorge C [name]        MRN: [id number]        DOB: 05/30/1946            Sex: male   REASON FOR CONSULT: Left common femoral sub-total occlusion in setting of previous aortobifem   HPI: [name] [name] is a 75 y.o. male, with history hypertension, hyperlipidemia, diabetes, coronary artery disease status post CABG, congestive heart failure (EF 25-30% 02/05/21) that presents for evaluation of high-grade calcified left common femoral stenosis in setting of left leg claudication.  He was referred by Dr. Allyson SabalBerry with cardiology.  He describes his left foot hurting all the time now.  Unfortunately he has now progressed to tissue loss and states he got wounds on his feet over the last several weeks.  He states his shoe rubbed ulcers on the back of the heel and on the toes.  [facility]  States he is ambulatory with a cane.  Sometimes he has to use a wheelchair when going long distances.  He does have a severely reduced EF.  He denies any chest pain or shortness of breath at this time.       Past Medical History:  Diagnosis Date   Acid reflux     AICD (automatic cardioverter/defibrillator) present       a. 08/2016 St. Jude (serial Number [id number]) biventricular ICD.   Chronic systolic CHF (congestive heart failure) (HCC)      a. 08/2016 Echo: EF 20%, diffuse HK, inf, post AK, mod-sev MR, sev dil LA, mod TR, PASP 59mmHg.   Complication of anesthesia      "was told I responded well to anesthesia"   Coronary artery disease involving native coronary artery of native heart with angina pectoris (HCC)      a. 10/2012 MI after aortobifem bypass, found to have multivessel CAD -->CABG x3;  b. 08/2016 Cath: LM 60, LAD 343m, LCX 60ost/p, RCA 100p, VG->RPDA 50p, VG->OM1 ok, LIMA->LAD ok, EF 25%.   Essential hypertension 03/03/2012   History of blood transfusion 11/2012    "during his CABG"   History of gout X 1   History of stomach ulcers 1970s   Hyperlipidemia associated with type 2 diabetes mellitus (HCC) 09/16/2011   Ischemic cardiomyopathy      a. 08/2016 Echo: EF 20%, diffuse HK, inf, post AK;  b. 08/2016 St. Jude (serial Number [id number]) biventricular ICD.   PAD (peripheral artery disease) (HCC)      s/p aortobifemoral bypass 10/2012   PTSD (post-traumatic stress disorder)     Type II diabetes mellitus (HCC)     Ventricular tachycardia (HCC)      a. 08/2016 St. Jude (serial Number [id number]) biventricular ICD-->oral amio added.  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 Is now on chronic suppressive antibiotics with dicloxacillin           Past Surgical History:  Procedure Laterality Date   AORTO-FEMORAL BYPASS GRAFT Bilateral 10/2012    [facility]   CARDIAC CATHETERIZATION   04/03/2013    [place] (? for abnormal Nuclear ST) --> no PCI, by report, patent grafts.   CARDIAC CATHETERIZATION N/A 09/02/2016    Procedure: Left Heart Cath and Cors/Grafts Angiography;  Surgeon: Kathleene Hazel, MD;  Location: [facility] INVASIVE CV LAB:: Ost LM 60% -> mid LAD 99% subCTO,  ost-prox LCx 60%; prox RCA 100% CTO w/ (small) SVG->RPDA prox 50%; SVG->OM1 ok, LIMA->mLAD ok, EF 25% - complicated by VT during access - Shock x 2, Lidocaine & Amiodarone   CARDIAC CATHETERIZATION   11/09/2012    [facility]: due to Sustained VT post-Ob Ao-BiFem Bypass. (no Angina)   CHOLECYSTECTOMY   03/05/2012    Procedure: LAPAROSCOPIC CHOLECYSTECTOMY WITH INTRAOPERATIVE CHOLANGIOGRAM;  Surgeon: Emelia Loron, MD;  Location: [facility] OR;  Service: General;  Laterality: N/A;   CORONARY ARTERY BYPASS GRAFT   11/2012    [facility]: CABG X 3 -> LIMA-mLAD, SVG-highOM1, SVG-RPDA (dRCA).   EP IMPLANTABLE DEVICE N/A 09/03/2016    Procedure: BI-VENTRICULAR IMPLANTABLE CARDIOVERTER DEFIBRILLATOR  (CRT-D);  Surgeon: Marinus Maw, MD;  Location: [facility] INVASIVE CV LAB;  Service: Cardiovascular;  Laterality: N/A;   INGUINAL HERNIA REPAIR Left 1990s   STERNOTOMY   09/2013    [facility] [place]): Sternal Wound Infection -> re-do sternotomy with metal place "sheild" placed. -Has chronic staph infection, on chronic suppressive medication   TRANSTHORACIC ECHOCARDIOGRAM   02/28/2017    EF remains 20p-25% severely reduced function. Diffuse hypokinesis. "Grade 1 diastolic dysfunction ". Severely calcified aortic valve with restricted mobility (functioning bicuspid) however no suggestion of stenosis.   UMBILICAL HERNIA REPAIR   03/05/2012    Procedure: HERNIA REPAIR UMBILICAL ADULT;  Surgeon: Emelia Loron, MD;  Location: [facility] OR;  Service: General;  Laterality: N/A;           Family History  Problem Relation Age of Onset   Cancer Brother          esophageal      SOCIAL HISTORY: Social History         Socioeconomic History   Marital status: Married      Spouse name: Not on file   Number of children: Not on file   Years of education: Not on file   Highest education level: Not on file  Occupational History   Not on file  Tobacco Use   Smoking status: Every Day      Types: Pipe   Smokeless tobacco: Former       Types: Snuff, Chew   Tobacco comments:      09/03/2016 "used smokeless tobacco in my teens; quit smoking cigarettes in the early 1980s; smokes pipe only"  Vaping Use   Vaping Use: Never used  Substance and Sexual Activity   Alcohol use: No   Drug use: No   Sexual activity: Not on file  Other Topics Concern   Not on file  Social History Narrative    Routine activity around the house, but is limited more by musculoskeletal leg pain and weakness.         He enjoys smoking a pipe-long ago, he quit cigarettes  Social Determinants of Health    Financial Resource Strain: Not on file  Food Insecurity: Not on file  Transportation Needs: Not on file  Physical Activity: Not on file  Stress: Not on file  Social Connections: Not on file  Intimate Partner Violence: Not on file      No Known Allergies         Current Outpatient Medications  Medication Sig Dispense Refill   naproxen (NAPROSYN) 500 MG tablet Take 500 mg by mouth as needed.       albuterol (VENTOLIN HFA) 108 (90 Base) MCG/ACT inhaler Inhale 1-2 puffs into the lungs every 6 (six) hours as needed for wheezing or shortness of breath.       Alpha-Lipoic Acid 600 MG TABS Take 2 tablets by mouth 2 (two) times daily.       aspirin EC 81 MG tablet Take 81 mg by mouth daily.       atorvastatin (LIPITOR) 80 MG tablet Take 0.5 tablets by mouth daily.       carvedilol (COREG) 6.25 MG tablet Take 1 tablet (6.25 mg total) by mouth 2 (two) times daily with a meal. 180 tablet 2   Cholecalciferol (VITAMIN D-3 PO) Take 2,000 mg by mouth daily.        dicloxacillin (DYNAPEN) 250 MG capsule Take 500 mg by mouth 3 (three) times daily.       famotidine (PEPCID) 20 MG tablet Take 20 mg by mouth as needed.       ferrous sulfate 324 MG TBEC Take 324 mg by mouth daily with breakfast.       fluticasone (FLONASE) 50 MCG/ACT nasal spray Place 2 sprays into both nostrils daily as needed for allergies or rhinitis.       furosemide (LASIX) 20 MG tablet  Take 1 tablet (20 mg total) by mouth daily. May take additional dose as needed for worsening swelling 90 tablet 3   glipiZIDE (GLUCOTROL) 5 MG tablet Take 5 mg by mouth 2 (two) times daily before a meal.       magnesium oxide (MAG-OX) 400 (241.3 Mg) MG tablet Take 1 tablet (400 mg total) by mouth daily. 30 tablet 6   metFORMIN (GLUCOPHAGE) 1000 MG tablet Take 1 tablet (1,000 mg total) by mouth 2 (two) times daily with a meal. Resume on 09/06/2016       nitroGLYCERIN (NITROSTAT) 0.4 MG SL tablet Place 1 tablet (0.4 mg total) under the tongue every 5 (five) minutes x 3 doses as needed for chest pain. 25 tablet 3   Tiotropium Bromide Monohydrate (SPIRIVA RESPIMAT) 2.5 MCG/ACT AERS Inhale 2 puffs into the lungs daily.       valsartan (DIOVAN) 40 MG tablet Take 40 mg by mouth daily.        No current facility-administered medications for this visit.      REVIEW OF SYSTEMS:  [X]  denotes positive finding, [ ]  denotes negative finding Cardiac   Comments:  Chest pain or chest pressure:      Shortness of breath upon exertion:      Short of breath when lying flat:      Irregular heart rhythm:             Vascular      Pain in calf, thigh, or hip brought on by ambulation:      Pain in feet at night that wakes you up from your sleep:  x Left  Blood clot in your veins:  Leg swelling:              Pulmonary      Oxygen at home:      Productive cough:       Wheezing:              Neurologic      Sudden weakness in arms or legs:       Sudden numbness in arms or legs:       Sudden onset of difficulty speaking or slurred speech:      Temporary loss of vision in one eye:       Problems with dizziness:              Gastrointestinal      Blood in stool:       Vomited blood:              Genitourinary      Burning when urinating:       Blood in urine:             Psychiatric      Major depression:              Hematologic      Bleeding problems:      Problems with blood clotting too  easily:             Skin      Rashes or ulcers:             Constitutional      Fever or chills:          PHYSICAL EXAM:    Vitals:    05/05/21 1008  BP: 132/74  Pulse: 80  Resp: 18  Temp: (!) 97.3 F (36.3 C)  TempSrc: Temporal  SpO2: 94%  Weight: 140 lb (63.5 kg)  Height: 5\' 6"  (1.676 m)      GENERAL: The patient is a well-nourished male, in no acute distress. The vital signs are documented above. CARDIAC: There is a regular rate and rhythm.  VASCULAR:  Bilateral femoral pulses are palpable No palpable pedal pulses Left foot has dependent rubor with multiple ulcerations as pictured below including the great toe and dorsum of the foot PULMONARY: No respiratory distress ABDOMEN: Soft and non-tender MUSCULOSKELETAL: There are no major deformities or cyanosis. NEUROLOGIC: No focal weakness or paresthesias are detected. PSYCHIATRIC: The patient has a normal affect.         DATA:    ABIs 01/09/2021 on the right 1.0 and on the left 0.93 with blunted waveforms and a toe pressure of 0   CTA 04/20/2021 reviewed and he has dense calcification in the common femoral artery on the left just distal to the aortobifem graft that appears high grade. Appears to have a stenosis in the proximal profunda.  SFA runoff is difficult to evaluate.   Assessment/Plan:   75 year old male presents for evaluation of high-grade left common femoral artery stenosis with dense calcification just distal to his aortobifemoral bypass.  Sounds like he had a history of left lower extremity claudication but unfortunately this has progressed to rest pain and now with tissue loss as pictured above.  He is going to require left common femoral endarterectomy with profundoplasty +/- a left femoral to distal bypass.  I do not know if a endarterectomy alone would be enough to get his foot wounds to heal given a toe pressure of 0.  I have scheduled him for aortogram with lower extremity arteriogram with my  partner Dr.  Lenell Antu on Thursday (since I am out of town) through a right transfemoral approach.  Discussed this will be diagnostic and then I will schedule him for surgery next Wednesday with me for left endarterectomy and possible bypass.  I have expressed my concerns with his cardiac status and I will send a note to his cardiologist to ensure he is optimized.  He has no chest pain shortness of breath or other active symptoms at this time.  Last EF was 25 to 30%.  This does appear to be a limb threatening situation.  Risk and benefits discussed with family.     Cephus Shelling, MD [facility] Office: [phone]

## 2021-05-13 NOTE — Op Note (Signed)
Date: May 13, 2021  Preoperative diagnosis: Critical limb ischemia of the left lower extremity with tissue loss in the setting of aortobifemoral bypass with subtotal occlusion of the left common femoral artery and severe SFA disease  Postoperative diagnosis: Same  Procedure: 1.  Redo exposure of the left common femoral artery greater than 30 days 2.  Left common femoral endarterectomy with profundoplasty and endarterectomy of the proximal SFA with bovine pericardial patch angioplasty 3.  Revision of left limb of aortobifem with a 10 mm dacron interposition 4.  Left lower extremity arteriogram with antegrade puncture 5.  Left SFA angioplasty with stent placement (6 mm x 100 mm Eluvia x2 and 6 mm x 120 mm Eluvia all postdilated with a 5 mm angioplasty balloon)  Surgeon: Dr. Cephus Shelling, MD  Assistant: Nathanial Rancher, PA  Indications: Patient is a 75 year old male who was referred by Dr. Allyson Sabal with cardiology.  Ultimately he was initially noted to have several years of claudication with remote history of aortobifemoral bypass.  Dr. Allyson Sabal worked him up and found that he had high-grade stenosis vs subtotal occlusion of the left common femoral artery distal to the aortobifem limb.  He was referred to vascular surgery.  In the interim he developed worsening rest pain with tissue loss over the last several weeks.  He presents today for planned left femoral endarterectomy and possible additional intervention including bypass after risk benefits discussed.  Findings: The left groin was reexposed through the old incision.  The graft did not appear very well incorporated and cultures were sent given this was placed in 2014.  I did not see any obvious signs of infection.  Ultimately the SFA and profunda were dissected out and I had to disconnect the left limb of aortobifemoral graft to fully endarterectomize the left common femoral artery with eversion endarterectomy of the proximal SFA and  endarterectomy of the profunda.  Ultimately had good backbleeding from the SFA and profunda branches after endarterectomy.  A bovine pericardial patch was sewn to the left common femoral artery onto the profunda.  I then sewed a new interposition graft from the existing left limb of the aortobifem onto the bovine patch.  When I listened to Doppler signals there was excellent flow in the profunda but more high resistance signal with a lot of diastolic flow in the SFA.  I elected to shoot a left lower extremity arteriogram given known SFA disease.  Ultimately elected to stent the majority SFA with a 6 mm Eluvia x3 all postdilated with a 5 mm balloons.  Brisk Doppler signals in the left foot at completion.  Anesthesia: General  Details: Patient was taken to the operating room after informed consent was obtained.  Placed on the operative table in the supine position.  General endotracheal anesthesia was induced.  The left groin and left leg were then prepped and draped in usual sterile fashion.  Timeout was performed and antibiotics were given.  Initially made a vertical incision in the left groin where his previous groin incision was located.  Dissected down with Bovie cautery and got the left limb of the aortobifem as well as SFA and profunda dissected out.  I was a little concerned given that the graft did not appear very well incorporated which would be unusual for a graft placed in 2014.  I did take cultures in the OR.  There were no obvious signs of infection or purulent drainage.  Ultimately once we had Vesseloops around the SFA and the profunda  I then gave the patient 100 units/kg IV heparin.  The SFA and profunda were controlled with Vesseloops.  The proximal limb of the left bypass graft under the inguinal ligament was controlled with a Henley clamp and the native external iliac was controlled with a Henley clamp.  I then opened the hood of the bypass graft onto the proximal profunda.  The proximal SFA and  profunda both appeared occluded with no backbleeding.  Ultimately became apparent needed to disconnect the aortobifemoral limb on the left to fully endarterectomized the artery which I did.  I then endarterectomized the common femoral with eversion endarterectomy of the SFA with a nice endpoint and I endarterectomized the profunda down to the first major branch.  We then had good backbleeding from the SFA and profunda.  The common femoral artery was then flushed with heparinized saline and washed out.  Once we were happy a bovine patch was brought in the field and sewn to the left common femoral artery onto the profunda with a 5-0 Prolene parachute technique.  We did de-air everything prior to completion.  I trimmed the left limb of the aortobifem back all the way up to the inguinal ligament and then I brought a new 10 mm dacron graft on the field and sewed an end to end anastomosis to the old graft at the inguinal ligament with 5-0 Prolene and we had good hemostasis.  I then straighten the graft to the appropriate length and then the graft was beveled and sewn end to side to the left common femoral onto the profunda where the patch had been placed.  Once we came off clamps everything was de-aired and we had very good profunda signal with a high resistance SFA signal with a lot of diastolic flow.  I elected to shoot a left lower extremity angiogram and stuck the left limb of the aortobifem with a micro access needle antegrade and placed a microwire and a micro sheath.  Ultimately the angiogram showed the left common femoral and profunda were widely patent. The SFA was diffusely diseased with multiple high-grade stenosis.  I elected to stent the near entire length of the SFA with two 6 mm x 100 mm Eluvia stents as well as a 6 mm x 120 mm Eluvia all postdilated with 5 mm balloons over a Glidewire advantage under fluoroscopic guidance.  Very satisfied with the results with no residual stenosis and widely patent stents.   That point time wires were removed and I tied down 5-0 Prolene pursestring around the sheath that was removed from the left groin.  We had good signals in the feet and protamine was given.  The groin was closed in multiple layers of 2-0 Vicryl, 3-0 Vicryl, 4-0 Monocryl and Dermabond.  Taken to recovery in stable condition.  Complication: None  Condition: Stable  Cephus Shelling, MD [facility] Office: [phone]   Cephus Shelling

## 2021-05-13 NOTE — Anesthesia Procedure Notes (Signed)
Procedure Name: Intubation Date/Time: 05/13/2021 12:02 PM Performed by: Annamary Carolin, CRNA Pre-anesthesia Checklist: Patient identified, Emergency Drugs available, Suction available and Patient being monitored Patient Re-evaluated:Patient Re-evaluated prior to induction Oxygen Delivery Method: Circle System Utilized Preoxygenation: Pre-oxygenation with 100% oxygen Induction Type: IV induction Ventilation: Mask ventilation without difficulty Laryngoscope Size: Mac and 3 Grade View: Grade I Tube type: Oral Number of attempts: 1 Airway Equipment and Method: Stylet and Oral airway Placement Confirmation: ETT inserted through vocal cords under direct vision, positive ETCO2 and breath sounds checked- equal and bilateral Secured at: 21 cm Tube secured with: Tape Dental Injury: Teeth and Oropharynx as per pre-operative assessment

## 2021-05-13 NOTE — Transfer of Care (Signed)
Immediate Anesthesia Transfer of Care Note  Patient: [name] [name]  Procedure(s) Performed: Left Femoral Endarterectomy WITH BOVINE PATCH PROFUNDOPLASTY, REVISION OF LEFT LIMB OF AORTO-BIFEMORAL GRAFT WITH DACRON GRAFT (Left: Leg Upper) INTRA OPERATIVE ARTERIOGRAM OF LEFT LOWER EXTREMITY (Left: Leg Upper) INSERTION OF SUPERFICIAL FEMORAL ARTERY STENT TIMES THREE AND ANGIOPLASTY (Left: Leg Upper)  Patient Location: PACU  Anesthesia Type:General  Level of Consciousness: awake, drowsy and patient cooperative  Airway & Oxygen Therapy: Patient Spontanous Breathing and Patient connected to face mask oxygen  Post-op Assessment: Report given to RN, Post -op Vital signs reviewed and stable and Patient moving all extremities X 4  Post vital signs: Reviewed and stable  Last Vitals:  Vitals Value Taken Time  BP    Temp    Pulse    Resp    SpO2      Last Pain:  Vitals:   05/13/21 0804  TempSrc:   PainSc: 5       Patients Stated Pain Goal: 1 (05/13/21 0804)  Complications: No notable events documented.

## 2021-05-13 NOTE — Progress Notes (Signed)
Remote ICD transmission.   

## 2021-05-14 LAB — GLUCOSE, CAPILLARY
Glucose-Capillary: 169 mg/dL — ABNORMAL HIGH (ref 70–99)
Glucose-Capillary: 191 mg/dL — ABNORMAL HIGH (ref 70–99)
Glucose-Capillary: 167 mg/dL — ABNORMAL HIGH (ref 70–99)

## 2021-05-14 LAB — POCT I-STAT 7, (LYTES, BLD GAS, ICA,H+H)
Acid-base deficit: 3 mmol/L — ABNORMAL HIGH (ref 0.0–2.0)
Bicarbonate: 24.9 mmol/L (ref 20.0–28.0)
Calcium, Ion: 1.17 mmol/L (ref 1.15–1.40)
HCT: 38 % — ABNORMAL LOW (ref 39.0–52.0)
Hemoglobin: 12.9 g/dL — ABNORMAL LOW (ref 13.0–17.0)
O2 Saturation: 100 %
Potassium: 4 mmol/L (ref 3.5–5.1)
Sodium: 132 mmol/L — ABNORMAL LOW (ref 135–145)
TCO2: 26 mmol/L (ref 22–32)
pCO2 arterial: 54.3 mmHg — ABNORMAL HIGH (ref 32.0–48.0)
pH, Arterial: 7.269 — ABNORMAL LOW (ref 7.350–7.450)
pO2, Arterial: 193 mmHg — ABNORMAL HIGH (ref 83.0–108.0)

## 2021-05-14 LAB — LIPID PANEL
Cholesterol: 105 mg/dL (ref 0–200)
HDL: 21 mg/dL — ABNORMAL LOW (ref >40)
LDL Cholesterol: 62 mg/dL (ref 0–99)
Total CHOL/HDL Ratio: 5 RATIO
Triglycerides: 109 mg/dL (ref <150)
VLDL: 22 mg/dL (ref 0–40)

## 2021-05-14 LAB — BASIC METABOLIC PANEL
Anion gap: 6 (ref 5–15)
BUN: 6 mg/dL — ABNORMAL LOW (ref 8–23)
CO2: 25 mmol/L (ref 22–32)
Calcium: 8.8 mg/dL — ABNORMAL LOW (ref 8.9–10.3)
Chloride: 101 mmol/L (ref 98–111)
Creatinine, Ser: 0.76 mg/dL (ref 0.61–1.24)
GFR, Estimated: >60 mL/min (ref >60)
Glucose, Bld: 174 mg/dL — ABNORMAL HIGH (ref 70–99)
Potassium: 4.2 mmol/L (ref 3.5–5.1)
Sodium: 132 mmol/L — ABNORMAL LOW (ref 135–145)

## 2021-05-14 LAB — CBC
HCT: 31.8 % — ABNORMAL LOW (ref 39.0–52.0)
Hemoglobin: 10.8 g/dL — ABNORMAL LOW (ref 13.0–17.0)
MCH: 27.8 pg (ref 26.0–34.0)
MCHC: 34 g/dL (ref 30.0–36.0)
MCV: 81.7 fL (ref 80.0–100.0)
Platelets: 243 10*3/uL (ref 150–400)
RBC: 3.89 MIL/uL — ABNORMAL LOW (ref 4.22–5.81)
RDW: 16.9 % — ABNORMAL HIGH (ref 11.5–15.5)
WBC: 13.7 10*3/uL — ABNORMAL HIGH (ref 4.0–10.5)
nRBC: 0 % (ref 0.0–0.2)

## 2021-05-14 LAB — POCT ACTIVATED CLOTTING TIME
Activated Clotting Time: 231 seconds
Activated Clotting Time: 208 seconds

## 2021-05-14 MED ORDER — CEFAZOLIN SODIUM-DEXTROSE 2-4 GM/100ML-% IV SOLN: 2.0000 g | Freq: Three times a day (TID) | INTRAVENOUS | Status: DC

## 2021-05-14 MED ORDER — CEFAZOLIN SODIUM-DEXTROSE 2-4 GM/100ML-% IV SOLN
2.0000 g | Freq: Three times a day (TID) | INTRAVENOUS | Status: DC
  Administered 2021-05-14 – 2021-05-15 (×3): 2 g via INTRAVENOUS
  Filled 2021-05-14 (×5): qty 100

## 2021-05-14 MED ORDER — NICOTINE 14 MG/24HR TD PT24
14.0000 mg | MEDICATED_PATCH | Freq: Every day | TRANSDERMAL | Status: DC
  Administered 2021-05-14 – 2021-05-15 (×2): 14 mg via TRANSDERMAL
  Filled 2021-05-14 (×2): qty 1

## 2021-05-14 NOTE — Evaluation (Signed)
Physical Therapy Evaluation Patient Details Name: [name] [name] MRN: [id number] DOB: 11/27/1945 Today's Date: 05/14/2021   History of Present Illness  75 y.o. male admitted 8/24 for LLE angioplasty with stent placement due to PAD. PMH: admission 8/16-8/18 with Lt common femoral sub-total occlusion and abdominal aortogram,HTN, HLD, T2DM, CAD s/p CABG, ICD, cardiomyopathy and CHF   Clinical Impression  Pt tolerated treatment well with family present and reported pain on L heel. Pt mentioned having difficulty walking due to pain, but ambulated 254ft in hall with min guard assist.  Pt's deficits include decreased mobility, gait, strength, activity tolerance and balance. Pt would benefit from continued PT to improve mentioned deficits and function. Recommend HHPT, as pt has accessible living environment and support. Patient and family agreeable with recommendation.    Follow Up Recommendations Home health PT    Equipment Recommendations  Rolling walker with 5" wheels    Recommendations for Other Services       Precautions / Restrictions Precautions Precautions: Fall Restrictions Weight Bearing Restrictions: No      Mobility  Bed Mobility               General bed mobility comments: in recliner upon arrival    Transfers Overall transfer level: Needs assistance Equipment used: Rolling walker (2 wheeled);None Transfers: Sit to/from Stand Sit to Stand: Min guard         General transfer comment: Min guard for safety x3. 1st trial without RW and 2 with RW. Improved stability and comfort with RW. 1st trial with increased time due to pain.  Ambulation/Gait Ambulation/Gait assistance: Min guard Gait Distance (Feet): 250 Feet Assistive device: Rolling walker (2 wheeled) Gait Pattern/deviations: Decreased dorsiflexion - left;Step-through pattern;Trunk flexed;Antalgic Gait velocity: Decreased Gait velocity interpretation: >2.62 ft/sec, indicative of community  ambulatory General Gait Details: Pt ambulated 298ft with seated rest break then additional 22ft. Min guard for safety. Pt demonstrated decreased heel strike on L and increased hip and trunk flexion. Verbal cues provided for upright posture and heel strike. Demonstrated good energy conservation techniques.  Stairs            Wheelchair Mobility    Modified Rankin (Stroke Patients Only)       Balance Overall balance assessment: Needs assistance         Standing balance support: Bilateral upper extremity supported;During functional activity Standing balance-Leahy Scale: Poor Standing balance comment: Relies on BUE with RW                             Pertinent Vitals/Pain Pain Score: 9  Pain Location: L heel, increased with mobility Pain Descriptors / Indicators: Guarding;Grimacing;Sore Pain Intervention(s): Monitored during session;Repositioned    Home Living Family/patient expects to be discharged to:: Private residence Living Arrangements: Spouse/significant other Available Help at Discharge: Family;Available 24 hours/day Type of Home: House Home Access: Ramped entrance;Stairs to enter Entrance Stairs-Rails: Right Entrance Stairs-Number of Steps: 4 Home Layout: Able to live on main level with bedroom/bathroom;Two level Home Equipment: Walker - 4 wheels;Cane - single point;Electric scooter Additional Comments: Has chairs outside/around house that pt uses for energy conservation    Prior Function Level of Independence: Independent with assistive device(s)         Comments: use of cane for mobility recently, will use electric scooter for trips to [employer]. Pt independent with ADLs, shares IADLs with wife though wife typically does the grocery shopping. Pt spends a lot of time in basement  as this is his work area [employer], sports), but does not require use of stairs to enter basement with ramp in place     Hand Dominance   Dominant Hand: Right     Extremity/Trunk Assessment   Upper Extremity Assessment Upper Extremity Assessment: Defer to OT evaluation    Lower Extremity Assessment Lower Extremity Assessment: Generalized weakness    Cervical / Trunk Assessment Cervical / Trunk Assessment: Normal  Communication   Communication: No difficulties  Cognition Arousal/Alertness: Awake/alert Behavior During Therapy: WFL for tasks assessed/performed Overall Cognitive Status: Within Functional Limits for tasks assessed                                        General Comments General comments (skin integrity, edema, etc.): VSS on RA; sores and blistering on L heel    Exercises     Assessment/Plan    PT Assessment Patient needs continued PT services  PT Problem List Decreased balance;Decreased mobility;Decreased activity tolerance;Decreased strength       PT Treatment Interventions Gait training;Functional mobility training;Therapeutic activities;Balance training    PT Goals (Current goals can be found in the Care Plan section)  Acute Rehab PT Goals Patient Stated Goal: to return home and walk with more independence PT Goal Formulation: With patient Time For Goal Achievement: 05/26/21 Potential to Achieve Goals: Good    Frequency Min 3X/week   Barriers to discharge        Co-evaluation               AM-PAC PT "6 Clicks" Mobility  Outcome Measure Help needed turning from your back to your side while in a flat bed without using bedrails?: None Help needed moving from lying on your back to sitting on the side of a flat bed without using bedrails?: None Help needed moving to and from a bed to a chair (including a wheelchair)?: None Help needed standing up from a chair using your arms (e.g., wheelchair or bedside chair)?: None Help needed to walk in hospital room?: A Little Help needed climbing 3-5 steps with a railing? : A Lot 6 Click Score: 21    End of Session Equipment Utilized During  Treatment: Gait belt Activity Tolerance: Patient tolerated treatment well Patient left: in chair;with call bell/phone within reach;with chair alarm set;with family/visitor present Nurse Communication: Mobility status PT Visit Diagnosis: Unsteadiness on feet (R26.81);Other abnormalities of gait and mobility (R26.89);Muscle weakness (generalized) (M62.81)    Time: 3716-9678 PT Time Calculation (min) (ACUTE ONLY): 26 min   Charges:   PT Evaluation $PT Eval Moderate Complexity: 1 Mod PT Treatments $Gait Training: 8-22 mins        Velda Shell, SPT Acute Rehab: [phone]    Vance Gather 05/14/2021, 12:38 PM

## 2021-05-14 NOTE — Progress Notes (Addendum)
Progress Note    05/14/2021 7:57 AM 1 Day Post-Op  Subjective:  sitting up in chair eating breakfast, no complaints   Vitals:   05/14/21 0341 05/14/21 0736  BP: (!) 109/50 115/62  Pulse: 83 86  Resp: 14 15  Temp: 98.3 F (36.8 C) 97.9 F (36.6 C)  SpO2: 94%    Physical Exam: Cardiac:  regular Lungs:  non labored Incisions:  left groin incision is clean dry and intact without swelling or hematoma Extremities:  well perfused and warm. Left knee and left foot wound superficial and unchanged. Left foot remains slightly erythematous. Motor and sensation intact Neurologic: alert and oriented  CBC    Component Value Date/Time   WBC 13.7 (H) 05/14/2021 0335   RBC 3.89 (L) 05/14/2021 0335   HGB 10.8 (L) 05/14/2021 0335   HCT 31.8 (L) 05/14/2021 0335   PLT 243 05/14/2021 0335   MCV 81.7 05/14/2021 0335   MCH 27.8 05/14/2021 0335   MCHC 34.0 05/14/2021 0335   RDW 16.9 (H) 05/14/2021 0335   LYMPHSABS 3.4 03/03/2012 1657   MONOABS 0.9 03/03/2012 1657   EOSABS 0.1 03/03/2012 1657   BASOSABS 0.0 03/03/2012 1657    BMET    Component Value Date/Time   NA 132 (L) 05/14/2021 0335   NA 133 (L) 10/27/2016 0955   K 4.2 05/14/2021 0335   CL 101 05/14/2021 0335   CO2 25 05/14/2021 0335   GLUCOSE 174 (H) 05/14/2021 0335   BUN 6 (L) 05/14/2021 0335   BUN 13 10/27/2016 0955   CREATININE 0.76 05/14/2021 0335   CREATININE 1.19 09/08/2016 1059   CALCIUM 8.8 (L) 05/14/2021 0335   GFRNONAA >60 05/14/2021 0335   GFRAA 81 10/27/2016 0955    INR    Component Value Date/Time   INR 1.1 05/11/2021 1153     Intake/Output Summary (Last 24 hours) at 05/14/2021 0757 Last data filed at 05/14/2021 0617 Gross per 24 hour  Intake 2628.98 ml  Output 2750 ml  Net -121.02 ml     Assessment/Plan:  75 y.o. male is s/p 1.  Redo exposure of the left common femoral artery greater than 30 days 2.  Left common femoral endarterectomy with profundoplasty and endarterectomy of the proximal SFA  with bovine pericardial patch angioplasty 3.  Revision of left limb of aortobifem with a 10 mm dacron interposition 4.  Left lower extremity arteriogram with antegrade puncture 5.  Left SFA angioplasty with stent placement (6 mm x 100 mm Eluvia x2 and 6 mm x 120 mm Eluvia all postdilated with a 5 mm angioplasty balloon)   1 Day Post-Op   Left groin incision is clean, dry and intact without swelling or hematoma LLE well perfused and warm with doppler PT/ DP signals  Pain control PRN Hemodynamically stable. VSS Scr okay 0.76 PT/ OT eval/ mobilize as tolerated Anticipate d/c home tomorrow   [name] [name], [facility] [phone] 05/14/2021 7:57 AM  I have seen and evaluated the patient. I agree with the PA note as documented above.  Postop day 1 status post left common femoral endarterectomy with endarterectomy of the proximal SFA and profunda as well as revision of the left limb of the aortobifem.  Then did antegrade puncture of the revised limb with left SFA angioplasty and stent x3.  The left groin looks clean dry and intact.  He has very brisk PT DP signals in the left foot.  This was for tissue loss.  Plavix Statin.  No growth  on OR cultures.  Will continue antibiotics.  IV ancef.  [name] Shelling, MD [facility] Office: [phone]

## 2021-05-14 NOTE — Discharge Instructions (Signed)
 [facility]  Discharge instructions  Lower Extremity Bypass Surgery  Please refer to the following instruction for your post-procedure care. Your surgeon or physician assistant will discuss any changes with you.  Activity  You are encouraged to walk as much as you can. You can slowly return to normal activities during the month after your surgery. Avoid strenuous activity and heavy lifting until your doctor tells you it's OK. Avoid activities such as vacuuming or swinging a golf club. Do not drive until your doctor give the OK and you are no longer taking prescription pain medications. It is also normal to have difficulty with sleep habits, eating and bowel movement after surgery. These will go away with time.  Bathing/Showering  Shower daily after you go home. Do not soak in a bathtub, hot tub, or swim until the incision heals completely.  Incision Care  Clean your incision with mild soap and water. Shower every day. Pat the area dry with a clean towel. You do not need a bandage unless otherwise instructed. Do not apply any ointments or creams to your incision. If you have open wounds you will be instructed how to care for them or a visiting nurse may be arranged for you. If you have staples or sutures along your incision they will be removed at your post-op appointment. You may have skin glue on your incision. Do not peel it off. It will come off on its own in about one week.  Wash the groin wound with soap and water daily and pat dry. (No tub bath-only shower)  Then put a dry gauze or washcloth in the groin to keep this area dry to help prevent wound infection.  Do this daily and as needed.  Do not use Vaseline or neosporin on your incisions.  Only use soap and water on your incisions and then protect and keep dry.  Diet  Resume your normal diet. There are no special food restrictions following this procedure. A low fat/ low cholesterol diet is  recommended for all patients with vascular disease. In order to heal from your surgery, it is CRITICAL to get adequate nutrition. Your body requires vitamins, minerals, and protein. Vegetables are the best source of vitamins and minerals. Vegetables also provide the perfect balance of protein. Processed food has little nutritional value, so try to avoid this.  Medications  Resume taking all your medications unless your doctor or physician assistant tells you not to. If your incision is causing pain, you may take over-the-counter pain relievers such as acetaminophen (Tylenol). If you were prescribed a stronger pain medication, please aware these medication can cause nausea and constipation. Prevent nausea by taking the medication with a snack or meal. Avoid constipation by drinking plenty of fluids and eating foods with high amount of fiber, such as fruits, vegetables, and grains. Take Colace 100 mg (an over-the-counter stool softener) twice a day as needed for constipation.  Do not take Tylenol if you are taking prescription pain medications.  Follow Up  Our office will schedule a follow up appointment 2-3 weeks following discharge.  Please call us immediately for any of the following conditions  Severe or worsening pain in your legs or feet while at rest or while walking Increase pain, redness, warmth, or drainage (pus) from your incision site(s) Fever of 101 degree or higher The swelling in your leg with the bypass suddenly worsens and becomes more painful than when you were in the hospital If you have   been instructed to feel your graft pulse then you should do so every day. If you can no longer feel this pulse, call the office immediately. Not all patients are given this instruction.  Leg swelling is common after leg bypass surgery.  The swelling should improve over a few months following surgery. To improve the swelling, you may elevate your legs above the level of your heart while you are  sitting or resting. Your surgeon or physician assistant may ask you to apply an ACE wrap or wear compression (TED) stockings to help to reduce swelling.  Reduce your risk of vascular disease  Stop smoking. If you would like help call [facility] at 1-800-QUIT-NOW (1-800-784-8669) or [place] at 336-586-4000.  Manage your cholesterol Maintain a desired weight Control your diabetes weight Control your diabetes Keep your blood pressure down  If you have any questions, please call the office at [phone]  

## 2021-05-14 NOTE — Evaluation (Signed)
Occupational Therapy Evaluation Patient Details Name: [name] [name] MRN: [id number] DOB: 06-04-1946 Today's Date: 05/14/2021    History of Present Illness 75 y.o. male admitted 8/24 for LLE angioplasty with sten placement due to PAD. PMH: admission 8/16-8/18 with Lt common femoral sub-total occlusion and abdominal aortogram,HTN, HLD, T2DM, CAD s/p CABG, ICD, cardiomyopathy and CHF   Clinical Impression   PTA, pt lives with spouse and reports Modified Independence with ADLs/mobility using cane. Pt spends a lot of time in basement at home doing woodworking and welding projects. Pt presents now with expected post-op L LE pain requiring use of RW to maintain balance and offload pressure during mobility at min guard. Pt overall Independent with UB ADLs and Supervision for LB ADLs. Educated on safety/compensatory strategies for ADL completion at home. Anticipate no OT needs at DC though will continue following acutely to maximize independence and ability to weightbear through L LE during ADLs.     Follow Up Recommendations  No OT follow up;Supervision - Intermittent    Equipment Recommendations  3 in 1 bedside commode;Other (comment) (Rolling walker)    Recommendations for Other Services       Precautions / Restrictions Precautions Precautions: Fall Restrictions Weight Bearing Restrictions: No      Mobility Bed Mobility Overal bed mobility: Modified Independent             General bed mobility comments: HOB elevated    Transfers Overall transfer level: Needs assistance Equipment used: None;Rolling walker (2 wheeled) Transfers: Sit to/from Stand Sit to Stand: Min guard         General transfer comment: min guard for sit to stand at bedside without AD though endorses increased LE pain in standing with preference for RW use. Able to stand with RW at min guard and reported improved comfort    Balance                                           ADL either  performed or assessed with clinical judgement   ADL Overall ADL's : Needs assistance/impaired Eating/Feeding: Independent;Sitting   Grooming: Supervision/safety;Standing   Upper Body Bathing: Independent;Sitting   Lower Body Bathing: Supervison/ safety;Sit to/from stand   Upper Body Dressing : Independent;Sitting   Lower Body Dressing: Supervision/safety;Sit to/from stand Lower Body Dressing Details (indicate cue type and reason): Able to don L sock sitting EOB bringing foot to self, no assist needed though increased time due to foot sore Toilet Transfer: Min guard;Ambulation;Regular Toilet;RW Toilet Transfer Details (indicate cue type and reason): noted pain and difficulty WB through L LE, - improved stability with use of RW to/from bathroom, cues for safe sequencing Toileting- Clothing Manipulation and Hygiene: Supervision/safety;Sit to/from stand Toileting - Clothing Manipulation Details (indicate cue type and reason): no physical assist needed, cues for gown mgmt     Functional mobility during ADLs: Min guard;Rolling walker;Cueing for sequencing General ADL Comments: Pt with expected post op pain, requiring use of RW for mobility to offload LE pressure and maintain stability. Educated on strategies for LB ADLs, easier clothing to don, use of RW initially at home and consideration of BSC for shower chair use     Vision Ability to See in Adequate Light: 0 Adequate Patient Visual Report: No change from baseline Vision Assessment?: No apparent visual deficits     Perception     Praxis  Pertinent Vitals/Pain Pain Assessment: 0-10 Pain Score: 8  Pain Location: L LE, increased with mobility Pain Descriptors / Indicators: Grimacing;Guarding;Sore Pain Intervention(s): Monitored during session;Premedicated before session;Repositioned     Hand Dominance Right   Extremity/Trunk Assessment Upper Extremity Assessment Upper Extremity Assessment: Overall WFL for tasks assessed    Lower Extremity Assessment Lower Extremity Assessment: Defer to PT evaluation   Cervical / Trunk Assessment Cervical / Trunk Assessment: Kyphotic (though could be due to LE pain)   Communication Communication Communication: No difficulties   Cognition Arousal/Alertness: Awake/alert Behavior During Therapy: WFL for tasks assessed/performed Overall Cognitive Status: Within Functional Limits for tasks assessed                                     General Comments  VSS    Exercises     Shoulder Instructions      Home Living Family/patient expects to be discharged to:: Private residence Living Arrangements: Spouse/significant other Available Help at Discharge: Family;Available 24 hours/day Type of Home: House Home Access: Stairs to enter [employer] of Steps: 4 Entrance Stairs-Rails: Right Home Layout: Two level;Laundry or work area in basement;Able to live on main level with bedroom/bathroom Alternate Teacher, music of Steps: flight to basement Alternate Level Stairs-Rails: Right Bathroom Shower/Tub: Tub/shower unit;Walk-in shower   Bathroom Toilet: Standard     Home Equipment: Environmental consultant - 4 wheels;Cane - single point;Electric scooter          Prior Functioning/Environment Level of Independence: Independent with assistive device(s)        Comments: use of cane for mobility recently, will use electric scooter for trips to [employer]. Pt independent with ADLs, shares IADLs with wife though wife typically does the grocery shopping. 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, Administrator, sports)        OT Problem List: Decreased activity tolerance;Impaired balance (sitting and/or standing);Pain;Decreased knowledge of use of DME or AE      OT Treatment/Interventions: Self-care/ADL training;Therapeutic exercise;Energy conservation;DME and/or AE instruction;Therapeutic activities;Patient/family education    OT Goals(Current  goals can be found in the care plan section) Acute Rehab OT Goals Patient Stated Goal: decrease pain, reduce need for DME during mobility OT Goal Formulation: With patient Time For Goal Achievement: 05/28/21 Potential to Achieve Goals: Good ADL Goals Pt Will Perform Lower Body Bathing: with modified independence;sit to/from stand Pt Will Perform Lower Body Dressing: with modified independence;sit to/from stand Pt Will Transfer to Toilet: with modified independence;ambulating  OT Frequency: Min 2X/week   Barriers to D/C:            Co-evaluation              AM-PAC OT "6 Clicks" Daily Activity     Outcome Measure Help from another person eating meals?: None Help from another person taking care of personal grooming?: A Little Help from another person toileting, which includes using toliet, bedpan, or urinal?: A Little Help from another person bathing (including washing, rinsing, drying)?: A Little Help from another person to put on and taking off regular upper body clothing?: None Help from another person to put on and taking off regular lower body clothing?: A Little 6 Click Score: 20   End of Session Equipment Utilized During Treatment: Gait belt;Rolling walker Nurse Communication: Mobility status  Activity Tolerance: Patient tolerated treatment well Patient left: in chair;with call bell/phone within reach;with chair alarm  set  OT Visit Diagnosis: Unsteadiness on feet (R26.81);Other abnormalities of gait and mobility (R26.89);Pain Pain - Right/Left: Left Pain - part of body: Leg                Time: 0701-0721 OT Time Calculation (min): 20 min Charges:  OT General Charges $OT Visit: 1 Visit OT Evaluation $OT Eval Low Complexity: 1 Low  Bradd Canary, OTR/L Acute Rehab Services Office: [phone]   Lorre Munroe 05/14/2021, 7:38 AM

## 2021-05-15 ENCOUNTER — Encounter (HOSPITAL_COMMUNITY): Payer: Self-pay | Admitting: Vascular Surgery

## 2021-05-15 MED ORDER — CLOPIDOGREL BISULFATE 75 MG PO TABS: 75.0000 mg | ORAL_TABLET | Freq: Every day | ORAL | 2 refills | Status: DC

## 2021-05-15 MED ORDER — OXYCODONE-ACETAMINOPHEN 5-325 MG PO TABS: 1.0000 | ORAL_TABLET | Freq: Four times a day (QID) | ORAL | 0 refills | Status: DC | PRN

## 2021-05-15 NOTE — Progress Notes (Signed)
Physical Therapy Treatment Patient Details Name: [name] [name] MRN: [id number] DOB: October 12, 1945 Today's Date: 05/15/2021    History of Present Illness 75 y.o. male admitted 8/24 for LLE angioplasty with stent placement due to PAD. PMH: admission 8/16-8/18 with Lt common femoral sub-total occlusion and abdominal aortogram,HTN, HLD, T2DM, CAD s/p CABG, ICD, cardiomyopathy and CHF    PT Comments    Pt tolerated treatment well with VSS throughout (HR 108-130 during activity). Pt ambulated the same distance as yesterday's session, requiring a rest break at approximately 250'. Pt demonstrated good recall of cues from previous session. Continue to recommend HHPT upon d/c to improve gait and activity tolerance.    Follow Up Recommendations  Home health PT     Equipment Recommendations  Rolling walker with 5" wheels    Recommendations for Other Services       Precautions / Restrictions Precautions Precautions: Fall Restrictions Weight Bearing Restrictions: No    Mobility  Bed Mobility Overal bed mobility: Modified Independent             General bed mobility comments: Increased time    Transfers Overall transfer level: Needs assistance Equipment used: Rolling walker (2 wheeled) Transfers: Sit to/from Stand Sit to Stand: Supervision         General transfer comment: Sit to stand x2 (1st trial from bed, 2nd from recliner) with RW. Increased time for pain  Ambulation/Gait Ambulation/Gait assistance: Supervision Gait Distance (Feet): 250 Feet   Gait Pattern/deviations: Decreased dorsiflexion - left;Step-through pattern;Antalgic Gait velocity: Decreased Gait velocity interpretation: >2.62 ft/sec, indicative of community ambulatory General Gait Details: Pt ambulated 250 ft prior to seated rest then an additional 278ft. Supervision for safety. Pt demonstrated good recall of verbal cues given in previous session with greater upright posture. Verbal cueing for heel  strike.   Stairs             Wheelchair Mobility    Modified Rankin (Stroke Patients Only)       Balance Overall balance assessment: Needs assistance Sitting-balance support: No upper extremity supported;Feet supported Sitting balance-Leahy Scale: Good     Standing balance support: Bilateral upper extremity supported;During functional activity Standing balance-Leahy Scale: Poor Standing balance comment: Relies on BUE with RW                            Cognition Arousal/Alertness: Awake/alert Behavior During Therapy: WFL for tasks assessed/performed Overall Cognitive Status: Within Functional Limits for tasks assessed                                        Exercises General Exercises - Lower Extremity Ankle Circles/Pumps: AROM;15 reps;Seated;Both Long Arc Quad: AROM;Both;15 reps;Seated Hip Flexion/Marching: AROM;Both;15 reps;Seated    General Comments General comments (skin integrity, edema, etc.): VSS on RA      Pertinent Vitals/Pain Pain Score: 9  Pain Location: L heel Pain Descriptors / Indicators: Sore;Guarding Pain Intervention(s): Monitored during session;Repositioned    Home Living                      Prior Function            PT Goals (current goals can now be found in the care plan section) Progress towards PT goals: Progressing toward goals    Frequency    Min 3X/week      PT  Plan Current plan remains appropriate    Co-evaluation              AM-PAC PT "6 Clicks" Mobility   Outcome Measure  Help needed turning from your back to your side while in a flat bed without using bedrails?: None Help needed moving from lying on your back to sitting on the side of a flat bed without using bedrails?: None Help needed moving to and from a bed to a chair (including a wheelchair)?: None Help needed standing up from a chair using your arms (e.g., wheelchair or bedside chair)?: None Help needed to walk  in hospital room?: None Help needed climbing 3-5 steps with a railing? : A Little 6 Click Score: 23    End of Session Equipment Utilized During Treatment: Gait belt Activity Tolerance: Patient tolerated treatment well Patient left: in chair;with call bell/phone within reach Nurse Communication: Mobility status PT Visit Diagnosis: Unsteadiness on feet (R26.81);Other abnormalities of gait and mobility (R26.89);Muscle weakness (generalized) (M62.81)     Time: 0981-1914 PT Time Calculation (min) (ACUTE ONLY): 25 min  Charges:  $Gait Training: 8-22 mins $Therapeutic Activity: 8-22 mins                     Velda Shell, SPT Acute Rehab: [phone]      Vance Gather 05/15/2021, 8:19 AM

## 2021-05-15 NOTE — Progress Notes (Signed)
PHARMACIST LIPID MONITORING   [name] [name] is a 75 y.o. male admitted on 05/13/2021 for re-do left leg procedure and stent.  Pharmacy has been consulted to optimize lipid-lowering therapy with the indication of secondary prevention for clinical ASCVD.  Recent Labs:  Lipid Panel (last 6 months):   Lab Results  Component Value Date   CHOL 105 05/14/2021   TRIG 109 05/14/2021   HDL 21 (L) 05/14/2021   CHOLHDL 5.0 05/14/2021   VLDL 22 05/14/2021   LDLCALC 62 05/14/2021    Hepatic function panel (last 6 months):   Lab Results  Component Value Date   AST 16 05/13/2021   ALT 11 05/13/2021   ALKPHOS 46 05/13/2021   BILITOT 0.4 05/13/2021   BILIDIR <0.1 05/13/2021   IBILI NOT CALCULATED 05/13/2021    SCr (since admission):   Serum creatinine: 0.76 mg/dL 03/00/92 3300 Estimated creatinine clearance: 72.8 mL/min  Current therapy and lipid therapy tolerance Current lipid-lowering therapy: Atorvastatin 40 mg daily Previous lipid-lowering therapies (if applicable): n/a Documented or reported allergies or intolerances to lipid-lowering therapies (if applicable): n/a  Assessment:    LDL 62, at goal  Plan:    1.Statin intensity (high intensity recommended for all patients regardless of the LDL):  No statin changes. The patient is already on a high intensity statin.   Dennie Fetters, RPh 05/15/2021, 12:39 PM

## 2021-05-15 NOTE — Progress Notes (Addendum)
  Progress Note    05/15/2021 7:48 AM 2 Days Post-Op  Subjective:  Ready for d/c home   Vitals:   05/14/21 2335 05/15/21 0327  BP: 126/73 129/70  Pulse: 93 82  Resp: 18 14  Temp: 98.2 F (36.8 C) 97.7 F (36.5 C)  SpO2: 100% 92%   Physical Exam: Lungs:  non labored Incisions:  L groin c/d/i Extremities:  brisk L ATA and PT; heal and toe wounds dry Neurologic: A&O  CBC    Component Value Date/Time   WBC 13.7 (H) 05/14/2021 0335   RBC 3.89 (L) 05/14/2021 0335   HGB 10.8 (L) 05/14/2021 0335   HCT 31.8 (L) 05/14/2021 0335   PLT 243 05/14/2021 0335   MCV 81.7 05/14/2021 0335   MCH 27.8 05/14/2021 0335   MCHC 34.0 05/14/2021 0335   RDW 16.9 (H) 05/14/2021 0335   LYMPHSABS 3.4 03/03/2012 1657   MONOABS 0.9 03/03/2012 1657   EOSABS 0.1 03/03/2012 1657   BASOSABS 0.0 03/03/2012 1657    BMET    Component Value Date/Time   NA 132 (L) 05/14/2021 0335   NA 133 (L) 10/27/2016 0955   K 4.2 05/14/2021 0335   CL 101 05/14/2021 0335   CO2 25 05/14/2021 0335   GLUCOSE 174 (H) 05/14/2021 0335   BUN 6 (L) 05/14/2021 0335   BUN 13 10/27/2016 0955   CREATININE 0.76 05/14/2021 0335   CREATININE 1.19 09/08/2016 1059   CALCIUM 8.8 (L) 05/14/2021 0335   GFRNONAA >60 05/14/2021 0335   GFRAA 81 10/27/2016 0955    INR    Component Value Date/Time   INR 1.1 05/11/2021 1153     Intake/Output Summary (Last 24 hours) at 05/15/2021 0748 Last data filed at 05/15/2021 0327 Gross per 24 hour  Intake 640 ml  Output 1425 ml  Net -785 ml     Assessment/Plan:  75 y.o. male is s/p L CFA endarterectomy and SFA stenting 2 Days Post-Op   L LE well perfused with brisk doppler signals Prescriptions provided for DAPT DME rolling walker and 3 in 1 ordered Home today; follow up in 2-3 weeks for incision check   Emilie Rutter, PA-C [facility] [phone] 05/15/2021 7:48 AM  I have seen and evaluated the patient. I agree with the PA note as documented above.   75 year old male status post left femoral endarterectomy with revision of left limb of aortobifem and left SFA stenting antegrade.  He has brisk Doppler signals in the left foot.  Groin incision is healing without issue.  No growth on cultures from the OR.  We will plan discharge today on aspirin Plavix.  Follow-up in 2 to 3 weeks for wound check and then long-term follow-up with Dr. Allyson Sabal.  Wounds on the left foot already look better.  Cephus Shelling, MD [facility] [place] Office: [phone]

## 2021-05-15 NOTE — Progress Notes (Signed)
Pt discharging home with family.  All instructions and medications reviewed and all questions answered.   DME delivered.  Follow up appts in place.

## 2021-05-16 DIAGNOSIS — I5022 Chronic systolic (congestive) heart failure: Secondary | ICD-10-CM | POA: Diagnosis not present

## 2021-05-16 DIAGNOSIS — M109 Gout, unspecified: Secondary | ICD-10-CM | POA: Diagnosis not present

## 2021-05-16 DIAGNOSIS — Z7902 Long term (current) use of antithrombotics/antiplatelets: Secondary | ICD-10-CM | POA: Diagnosis not present

## 2021-05-16 DIAGNOSIS — Z9181 History of falling: Secondary | ICD-10-CM | POA: Diagnosis not present

## 2021-05-16 DIAGNOSIS — F17200 Nicotine dependence, unspecified, uncomplicated: Secondary | ICD-10-CM | POA: Diagnosis not present

## 2021-05-16 DIAGNOSIS — Z7982 Long term (current) use of aspirin: Secondary | ICD-10-CM | POA: Diagnosis not present

## 2021-05-16 DIAGNOSIS — I739 Peripheral vascular disease, unspecified: Secondary | ICD-10-CM | POA: Diagnosis not present

## 2021-05-16 DIAGNOSIS — Z741 Need for assistance with personal care: Secondary | ICD-10-CM | POA: Diagnosis not present

## 2021-05-16 DIAGNOSIS — Z951 Presence of aortocoronary bypass graft: Secondary | ICD-10-CM | POA: Diagnosis not present

## 2021-05-16 DIAGNOSIS — M6281 Muscle weakness (generalized): Secondary | ICD-10-CM | POA: Diagnosis not present

## 2021-05-16 DIAGNOSIS — Z48812 Encounter for surgical aftercare following surgery on the circulatory system: Secondary | ICD-10-CM | POA: Diagnosis not present

## 2021-05-16 DIAGNOSIS — K219 Gastro-esophageal reflux disease without esophagitis: Secondary | ICD-10-CM | POA: Diagnosis not present

## 2021-05-16 DIAGNOSIS — Z7984 Long term (current) use of oral hypoglycemic drugs: Secondary | ICD-10-CM | POA: Diagnosis not present

## 2021-05-16 DIAGNOSIS — I2510 Atherosclerotic heart disease of native coronary artery without angina pectoris: Secondary | ICD-10-CM | POA: Diagnosis not present

## 2021-05-16 DIAGNOSIS — I77811 Abdominal aortic ectasia: Secondary | ICD-10-CM | POA: Diagnosis not present

## 2021-05-16 DIAGNOSIS — I70228 Atherosclerosis of native arteries of extremities with rest pain, other extremity: Secondary | ICD-10-CM | POA: Diagnosis not present

## 2021-05-16 DIAGNOSIS — I110 Hypertensive heart disease with heart failure: Secondary | ICD-10-CM | POA: Diagnosis not present

## 2021-05-16 DIAGNOSIS — F4310 Post-traumatic stress disorder, unspecified: Secondary | ICD-10-CM | POA: Diagnosis not present

## 2021-05-16 DIAGNOSIS — E1151 Type 2 diabetes mellitus with diabetic peripheral angiopathy without gangrene: Secondary | ICD-10-CM | POA: Diagnosis not present

## 2021-05-18 DIAGNOSIS — I77811 Abdominal aortic ectasia: Secondary | ICD-10-CM | POA: Diagnosis not present

## 2021-05-18 DIAGNOSIS — I5022 Chronic systolic (congestive) heart failure: Secondary | ICD-10-CM | POA: Diagnosis not present

## 2021-05-18 DIAGNOSIS — Z48812 Encounter for surgical aftercare following surgery on the circulatory system: Secondary | ICD-10-CM | POA: Diagnosis not present

## 2021-05-18 DIAGNOSIS — I70228 Atherosclerosis of native arteries of extremities with rest pain, other extremity: Secondary | ICD-10-CM | POA: Diagnosis not present

## 2021-05-18 DIAGNOSIS — I2510 Atherosclerotic heart disease of native coronary artery without angina pectoris: Secondary | ICD-10-CM | POA: Diagnosis not present

## 2021-05-18 DIAGNOSIS — I110 Hypertensive heart disease with heart failure: Secondary | ICD-10-CM | POA: Diagnosis not present

## 2021-05-18 LAB — AEROBIC/ANAEROBIC CULTURE W GRAM STAIN (SURGICAL/DEEP WOUND): Culture: NO GROWTH

## 2021-05-18 NOTE — Discharge Summary (Signed)
Discharge Summary  Patient ID: [name] [name] [id number] 75 y.o. 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  He was brought to the operating room by Dr. Chestine Spore on 05/13/2021 and underwent left common femoral endarterectomy with bovine patch angioplasty as well as revision of the left limb of the aortobifemoral bypass and stenting of the left SFA.  He tolerated this procedure well and was admitted to the hospital postoperatively.  His hospital stay consisted of increasing mobility and postoperative pain control.  He was started on Plavix due to SFA stent.  At the time of discharge he had brisk left ATA and PTA signals by Doppler.  He will follow-up in office in about 3 weeks for incision check with Dr. Chestine Spore.  He will then follow-up with Dr. Gery Pray for continued surveillance of PAD.  He was discharged with 2 to 3 days of narcotic pain medication for continued postoperative pain control.  He was discharged home in stable condition.  Consults: None  Treatments: surgery: Left common femoral endarterectomy with bovine patch angioplasty as well as revision of left limb of the aortobifem and left SFA stenting by Dr. Chestine Spore on 05/13/2021.  Discharge Exam: See progress note 05/15/21 Vitals:   05/15/21 0327 05/15/21 0905  BP: 129/70 123/62  Pulse: 82 75  Resp: 14 15  Temp: 97.7 F (36.5 C) 98 F (36.7 C)  SpO2: 92% 100%     Disposition: Discharge  disposition: 01-Home or Self Care       Patient Instructions:  Allergies as of 05/15/2021   No Known Allergies      Medication List     TAKE these medications    albuterol 108 (90 Base) MCG/ACT inhaler Commonly known as: VENTOLIN HFA Inhale 1-2 puffs into the lungs every 6 (six) hours as needed for wheezing or shortness of breath.   Alpha-Lipoic Acid 600 MG Tabs Take 1,200 mg by mouth 2 (two) times daily.   aspirin EC 81 MG tablet Take 81 mg by mouth daily.   atorvastatin 80 MG tablet Commonly known as: LIPITOR Take 40 mg by mouth every evening.   carvedilol 6.25 MG tablet Commonly known as: COREG Take 1 tablet (6.25 mg total) by mouth 2 (two) times daily with a meal.   cholecalciferol 25 MCG (1000 UNIT) tablet Commonly known as: VITAMIN D Take 2,000 Units by mouth every evening.   clopidogrel 75 MG tablet Commonly known as: PLAVIX Take 1 tablet (75 mg total) by mouth daily at 6 (six) AM.   dicloxacillin 250 MG capsule Commonly known as: DYNAPEN Take 500 mg by mouth 3 (three) times daily.   famotidine 20 MG tablet Commonly known as: PEPCID Take 20 mg by mouth 2 (two) times daily.   ferrous sulfate 324 MG Tbec Take 324 mg by mouth daily with breakfast.   fluticasone 50 MCG/ACT nasal spray Commonly known as: FLONASE Place 2 sprays into both nostrils daily as needed for allergies or rhinitis.   furosemide 20 MG tablet Commonly known as: LASIX  Take 1 tablet (20 mg total) by mouth daily. May take additional dose as needed for worsening swelling   glipiZIDE 5 MG tablet Commonly known as: GLUCOTROL Take 5 mg by mouth 2 (two) times daily before a meal.   magnesium oxide 400 (241.3 Mg) MG tablet Commonly known as: MAG-OX Take 1 tablet (400 mg total) by mouth daily.   metFORMIN 1000 MG tablet Commonly known as: GLUCOPHAGE Take 1 tablet (1,000 mg total) by mouth 2 (two) times daily with a meal. Resume on 09/06/2016   naproxen 500 MG tablet Commonly  known as: NAPROSYN Take 500 mg by mouth in the morning and at bedtime.   nitroGLYCERIN 0.4 MG SL tablet Commonly known as: NITROSTAT Place 1 tablet (0.4 mg total) under the tongue every 5 (five) minutes x 3 doses as needed for chest pain.   oxyCODONE-acetaminophen 5-325 MG tablet Commonly known as: PERCOCET/ROXICET Take 1 tablet by mouth every 6 (six) hours as needed for moderate pain.   Spiriva Respimat 2.5 MCG/ACT Aers Generic drug: Tiotropium Bromide Monohydrate Inhale 2 puffs into the lungs at bedtime.   terbinafine 1 % cream Commonly known as: LAMISIL Apply 1 application topically 2 (two) times daily as needed (athlete's foot).   valsartan 40 MG tablet Commonly known as: DIOVAN Take 40 mg by mouth in the morning.       Activity: activity as tolerated Diet: regular diet Wound Care: keep wound clean and dry  Follow-up with [facility] in 3 weeks.  Signed: Emilie Rutter, PA-C 05/18/2021 9:14 AM [facility] Office: [phone]

## 2021-05-19 ENCOUNTER — Telehealth: Payer: Self-pay | Admitting: *Deleted

## 2021-05-19 DIAGNOSIS — I70228 Atherosclerosis of native arteries of extremities with rest pain, other extremity: Secondary | ICD-10-CM | POA: Diagnosis not present

## 2021-05-19 DIAGNOSIS — I77811 Abdominal aortic ectasia: Secondary | ICD-10-CM | POA: Diagnosis not present

## 2021-05-19 DIAGNOSIS — I110 Hypertensive heart disease with heart failure: Secondary | ICD-10-CM | POA: Diagnosis not present

## 2021-05-19 DIAGNOSIS — I5022 Chronic systolic (congestive) heart failure: Secondary | ICD-10-CM | POA: Diagnosis not present

## 2021-05-19 DIAGNOSIS — Z48812 Encounter for surgical aftercare following surgery on the circulatory system: Secondary | ICD-10-CM | POA: Diagnosis not present

## 2021-05-19 DIAGNOSIS — I2510 Atherosclerotic heart disease of native coronary artery without angina pectoris: Secondary | ICD-10-CM | POA: Diagnosis not present

## 2021-05-19 NOTE — Telephone Encounter (Signed)
Pts wife called at the occupational therapists request to notify us that the pt is not taking percoset every 6 hours but only when he needs it. He is taking tylenol most of the time. I let her know that this was great, and the less narcotics he takes the better. Pts wife also asked if he would be ok to take a trip to the mountains in a couple of weeks. I advised that if he wasn't having any problems and everything was healing well it should be fine for him to travel. Pts wife verbalized understanding.

## 2021-05-19 NOTE — Anesthesia Postprocedure Evaluation (Signed)
Anesthesia Post Note  Patient: [name] [name]  Procedure(s) Performed: Left Femoral Endarterectomy WITH BOVINE PATCH PROFUNDOPLASTY, REVISION OF LEFT LIMB OF AORTO-BIFEMORAL GRAFT WITH DACRON GRAFT (Left: Leg Upper) INTRA OPERATIVE ARTERIOGRAM OF LEFT LOWER EXTREMITY (Left: Leg Upper) INSERTION OF SUPERFICIAL FEMORAL ARTERY STENT TIMES THREE AND ANGIOPLASTY (Left: Leg Upper)     Patient location during evaluation: PACU Anesthesia Type: General Level of consciousness: patient cooperative and awake Pain management: pain level controlled Vital Signs Assessment: post-procedure vital signs reviewed and stable Respiratory status: spontaneous breathing, nonlabored ventilation, respiratory function stable and patient connected to nasal cannula oxygen Cardiovascular status: blood pressure returned to baseline and stable Postop Assessment: no apparent nausea or vomiting Anesthetic complications: no   No notable events documented.  Last Vitals:  Vitals:   05/15/21 0327 05/15/21 0905  BP: 129/70 123/62  Pulse: 82 75  Resp: 14 15  Temp: 36.5 C 36.7 C  SpO2: 92% 100%    Last Pain:  Vitals:   05/15/21 1252  TempSrc:   PainSc: 6                  Shahidah Nesbitt

## 2021-05-21 ENCOUNTER — Telehealth: Payer: Self-pay | Admitting: Cardiology

## 2021-05-21 DIAGNOSIS — I2510 Atherosclerotic heart disease of native coronary artery without angina pectoris: Secondary | ICD-10-CM | POA: Diagnosis not present

## 2021-05-21 DIAGNOSIS — I77811 Abdominal aortic ectasia: Secondary | ICD-10-CM | POA: Diagnosis not present

## 2021-05-21 DIAGNOSIS — I70228 Atherosclerosis of native arteries of extremities with rest pain, other extremity: Secondary | ICD-10-CM | POA: Diagnosis not present

## 2021-05-21 DIAGNOSIS — I5022 Chronic systolic (congestive) heart failure: Secondary | ICD-10-CM | POA: Diagnosis not present

## 2021-05-21 DIAGNOSIS — I110 Hypertensive heart disease with heart failure: Secondary | ICD-10-CM | POA: Diagnosis not present

## 2021-05-21 DIAGNOSIS — Z48812 Encounter for surgical aftercare following surgery on the circulatory system: Secondary | ICD-10-CM | POA: Diagnosis not present

## 2021-05-21 NOTE — Telephone Encounter (Signed)
Omeprazole has an interaction with Plavix -- which is why I made the change.   We changed him to Plavix recently -- if he needs a PPI - would be best to change to Protonix (Pantoprazole) 40 mg instead of Omeprazole.  Bryan Lemma, MD   (Rx: Omeprazole 40 mg po daily, Disp 40 tab, 11 refills) --> if this works - can covert to 90 d Rx for f/u.  Bryan Lemma, MD

## 2021-05-21 NOTE — Telephone Encounter (Signed)
Pt c/o medication issue:  1. Name of Medication: famotidine (PEPCID) 20 MG tablet  2. How are you currently taking this medication (dosage and times per day)? 20 mg twice daily  3. Are you having a reaction (difficulty breathing--STAT)?  Not working as well as the Omeprazole   4. What is your medication issue? Wife wants to know if the patient can go back on the omeprazole

## 2021-05-21 NOTE — Telephone Encounter (Signed)
Pts wife calling with c/o esophageal reflux since being switched to famotidine from omeprazole. She states last night he was so uncomfortable, he went ahead and took an omeprazole. She would like to know if he can switch back.  Pts wife and I discussed how omeprazole can interfere with clopidogrel and he should not take them together. However, he could take another PPI that does not interfere with clopidogrel but it is a prescription.  I will forward to Dr. Herbie Baltimore for recommendation.

## 2021-05-22 ENCOUNTER — Telehealth: Payer: Self-pay | Admitting: Cardiology

## 2021-05-22 MED ORDER — PANTOPRAZOLE SODIUM 40 MG PO TBEC: 40.0000 mg | DELAYED_RELEASE_TABLET | Freq: Every day | ORAL | 2 refills | Status: DC

## 2021-05-22 NOTE — Telephone Encounter (Signed)
Omeprazole has an interaction with Plavix -- which is why I made the change.    We changed him to Plavix recently -- if he needs a PPI - would be best to change to Protonix (Pantoprazole) 40 mg instead of Omeprazole.   Bryan Lemma, MD     (Rx: Omeprazole 40 mg po daily, Disp 40 tab, 11 refills) --> if this works - can covert to 90 d Rx for f/u.   Bryan Lemma, MD   ...............................................................  PT would like for you to clarify how they should take Protonix... please advise

## 2021-05-22 NOTE — Telephone Encounter (Signed)
Spoke to patient 's wife - aware of the change to 40 mg  Pantoprazole  daily   New prescription sent to pharmacy   Wife aware she could purchase over the counter if needed - 20 mg dose and take 2 tablet to equal 40 mg Wife voiced understanding

## 2021-05-22 NOTE — Telephone Encounter (Signed)
Spoke to patient 's wife - aware of the change to 40 mg  Pantoprazole  daily   New prescription sent to pharmacy   Wife aware she could purchase over the counter if needed - 20 mg dose and take 2 tablet to equal 40 mg Wife voiced understanding  ( See telephone note )

## 2021-05-26 DIAGNOSIS — I2510 Atherosclerotic heart disease of native coronary artery without angina pectoris: Secondary | ICD-10-CM | POA: Diagnosis not present

## 2021-05-26 DIAGNOSIS — I5022 Chronic systolic (congestive) heart failure: Secondary | ICD-10-CM | POA: Diagnosis not present

## 2021-05-26 DIAGNOSIS — I70228 Atherosclerosis of native arteries of extremities with rest pain, other extremity: Secondary | ICD-10-CM | POA: Diagnosis not present

## 2021-05-26 DIAGNOSIS — I77811 Abdominal aortic ectasia: Secondary | ICD-10-CM | POA: Diagnosis not present

## 2021-05-26 DIAGNOSIS — I110 Hypertensive heart disease with heart failure: Secondary | ICD-10-CM | POA: Diagnosis not present

## 2021-05-26 DIAGNOSIS — Z48812 Encounter for surgical aftercare following surgery on the circulatory system: Secondary | ICD-10-CM | POA: Diagnosis not present

## 2021-05-27 DIAGNOSIS — I70228 Atherosclerosis of native arteries of extremities with rest pain, other extremity: Secondary | ICD-10-CM | POA: Diagnosis not present

## 2021-05-27 DIAGNOSIS — I2510 Atherosclerotic heart disease of native coronary artery without angina pectoris: Secondary | ICD-10-CM | POA: Diagnosis not present

## 2021-05-27 DIAGNOSIS — Z48812 Encounter for surgical aftercare following surgery on the circulatory system: Secondary | ICD-10-CM | POA: Diagnosis not present

## 2021-05-27 DIAGNOSIS — I110 Hypertensive heart disease with heart failure: Secondary | ICD-10-CM | POA: Diagnosis not present

## 2021-05-27 DIAGNOSIS — I77811 Abdominal aortic ectasia: Secondary | ICD-10-CM | POA: Diagnosis not present

## 2021-05-27 DIAGNOSIS — I5022 Chronic systolic (congestive) heart failure: Secondary | ICD-10-CM | POA: Diagnosis not present

## 2021-05-28 DIAGNOSIS — I2510 Atherosclerotic heart disease of native coronary artery without angina pectoris: Secondary | ICD-10-CM | POA: Diagnosis not present

## 2021-05-28 DIAGNOSIS — I110 Hypertensive heart disease with heart failure: Secondary | ICD-10-CM | POA: Diagnosis not present

## 2021-05-28 DIAGNOSIS — I5022 Chronic systolic (congestive) heart failure: Secondary | ICD-10-CM | POA: Diagnosis not present

## 2021-05-28 DIAGNOSIS — Z48812 Encounter for surgical aftercare following surgery on the circulatory system: Secondary | ICD-10-CM | POA: Diagnosis not present

## 2021-05-28 DIAGNOSIS — I70228 Atherosclerosis of native arteries of extremities with rest pain, other extremity: Secondary | ICD-10-CM | POA: Diagnosis not present

## 2021-05-28 DIAGNOSIS — I77811 Abdominal aortic ectasia: Secondary | ICD-10-CM | POA: Diagnosis not present

## 2021-06-01 ENCOUNTER — Ambulatory Visit (INDEPENDENT_AMBULATORY_CARE_PROVIDER_SITE_OTHER): Payer: Medicare Other

## 2021-06-01 DIAGNOSIS — Z95810 Presence of automatic (implantable) cardiac defibrillator: Secondary | ICD-10-CM

## 2021-06-01 DIAGNOSIS — I5022 Chronic systolic (congestive) heart failure: Secondary | ICD-10-CM

## 2021-06-02 ENCOUNTER — Encounter: Payer: Medicare Other | Admitting: Vascular Surgery

## 2021-06-03 ENCOUNTER — Telehealth: Payer: Self-pay

## 2021-06-03 NOTE — Telephone Encounter (Signed)
LMOVM for patient to send missed ICM home remote appointment.

## 2021-06-05 NOTE — Progress Notes (Signed)
EPIC Encounter for ICM Monitoring  Patient Name: [name] [name] is a 75 y.o. male Date: 06/05/2021 Primary Care Physican: Dellia Cloud, MD Primary Cardiologist: Herbie Baltimore Electrophysiologist: Rae Roam Pacing: 98%     03/02/2021 Weight: 142 lbs     Transmission Reviewed.   Corvue thoracic impedance suggesting normal fluids.     Prescribed:  Furosemide 40 mg tablets - Alternate taking 20 mg daily and 40 mg daily Aspirin 81 mg daily   Labs: 12/12/2019 Creatinine 0.9, BUN 6, Potassium 4.5, Sodium 133, GFR 88.2 A complete set of results can be found in Results Review.   Recommendations:  No changes.   Follow-up plan: ICM clinic phone appointment on 07/06/2021.   91 day device clinic remote transmission 07/17/2021.     EP/Cardiology Office Visits:   06/23/2021 with Dr Allyson Sabal.   Copy of ICM check sent to Dr. Ladona Ridgel.    120 Day Direct ICM trend: 06/04/2021.           Karie Soda, RN 06/05/2021 1:35 PM

## 2021-06-09 ENCOUNTER — Other Ambulatory Visit: Payer: Self-pay

## 2021-06-09 ENCOUNTER — Ambulatory Visit (INDEPENDENT_AMBULATORY_CARE_PROVIDER_SITE_OTHER): Payer: Medicare Other | Admitting: Physician Assistant

## 2021-06-09 VITALS — BP 114/66 | HR 68 | Temp 97.8°F | Resp 20 | Ht 66.0 in | Wt 136.0 lb

## 2021-06-09 DIAGNOSIS — I2510 Atherosclerotic heart disease of native coronary artery without angina pectoris: Secondary | ICD-10-CM | POA: Diagnosis not present

## 2021-06-09 DIAGNOSIS — I70228 Atherosclerosis of native arteries of extremities with rest pain, other extremity: Secondary | ICD-10-CM | POA: Diagnosis not present

## 2021-06-09 DIAGNOSIS — I110 Hypertensive heart disease with heart failure: Secondary | ICD-10-CM | POA: Diagnosis not present

## 2021-06-09 DIAGNOSIS — Z48812 Encounter for surgical aftercare following surgery on the circulatory system: Secondary | ICD-10-CM | POA: Diagnosis not present

## 2021-06-09 DIAGNOSIS — I77811 Abdominal aortic ectasia: Secondary | ICD-10-CM | POA: Diagnosis not present

## 2021-06-09 DIAGNOSIS — I5022 Chronic systolic (congestive) heart failure: Secondary | ICD-10-CM | POA: Diagnosis not present

## 2021-06-09 DIAGNOSIS — I70229 Atherosclerosis of native arteries of extremities with rest pain, unspecified extremity: Secondary | ICD-10-CM

## 2021-06-09 NOTE — Progress Notes (Signed)
POST OPERATIVE OFFICE NOTE    CC:  F/u for surgery  HPI:  This is a 75 y.o. male who is s/p left common femoral endarterectomy with profundoplasty and bovine patch angioplasty with revision of left limb of aortobifem and left SFA stent placement by Dr. Chestine Spore on 05/13/2021.  This was performed due to nonhealing wounds of left foot.  Patient states his left groin incision is well-healed.  Ulcers on his left foot and heel are nearly healed.  He denies any claudication, rest pain, or new wounds.  He is taking aspirin, Plavix, statin daily.  He continues to smoke his pipe every day and has no interest in quitting.  He does have some edema of his left leg however this is managed with elevation.  His daughter is present today.  No Known Allergies  Current Outpatient Medications  Medication Sig Dispense Refill   albuterol (VENTOLIN HFA) 108 (90 Base) MCG/ACT inhaler Inhale 1-2 puffs into the lungs every 6 (six) hours as needed for wheezing or shortness of breath.     Alpha-Lipoic Acid 600 MG TABS Take 1,200 mg by mouth 2 (two) times daily.     aspirin EC 81 MG tablet Take 81 mg by mouth daily.     atorvastatin (LIPITOR) 80 MG tablet Take 40 mg by mouth every evening.     carvedilol (COREG) 6.25 MG tablet Take 1 tablet (6.25 mg total) by mouth 2 (two) times daily with a meal. 180 tablet 2   cholecalciferol (VITAMIN D) 25 MCG (1000 UNIT) tablet Take 2,000 Units by mouth every evening.     clopidogrel (PLAVIX) 75 MG tablet Take 1 tablet (75 mg total) by mouth daily at 6 (six) AM. 30 tablet 2   dicloxacillin (DYNAPEN) 250 MG capsule Take 500 mg by mouth 3 (three) times daily.     ferrous sulfate 324 MG TBEC Take 324 mg by mouth daily with breakfast.     fluticasone (FLONASE) 50 MCG/ACT nasal spray Place 2 sprays into both nostrils daily as needed for allergies or rhinitis.     furosemide (LASIX) 20 MG tablet Take 1 tablet (20 mg total) by mouth daily. May take additional dose as needed for worsening  swelling 90 tablet 3   glipiZIDE (GLUCOTROL) 5 MG tablet Take 5 mg by mouth 2 (two) times daily before a meal.     magnesium oxide (MAG-OX) 400 (241.3 Mg) MG tablet Take 1 tablet (400 mg total) by mouth daily. 30 tablet 6   metFORMIN (GLUCOPHAGE) 1000 MG tablet Take 1 tablet (1,000 mg total) by mouth 2 (two) times daily with a meal. Resume on 09/06/2016     nitroGLYCERIN (NITROSTAT) 0.4 MG SL tablet Place 1 tablet (0.4 mg total) under the tongue every 5 (five) minutes x 3 doses as needed for chest pain. 25 tablet 3   pantoprazole (PROTONIX) 40 MG tablet Take 1 tablet (40 mg total) by mouth daily. 90 tablet 2   Tiotropium Bromide Monohydrate (SPIRIVA RESPIMAT) 2.5 MCG/ACT AERS Inhale 2 puffs into the lungs at bedtime.     valsartan (DIOVAN) 40 MG tablet Take 40 mg by mouth in the morning.     No current facility-administered medications for this visit.     ROS:  See HPI  Physical Exam:  Vitals:   06/09/21 1041  BP: 114/66  Pulse: 68  Resp: 20  Temp: 97.8 F (36.6 C)  TempSrc: Temporal  SpO2: 100%  Weight: 136 lb (61.7 kg)  Height: 5\' 6"  (1.676 m)  Incision: Left groin incision well-healed Extremities: Small her remaining superficial left heel and great toe ulcer with no sign of infection; palpable left ATA Neuro: A&O Abdomen:  soft  Assessment/Plan:  This is a 75 y.o. male who is s/p: Left common femoral artery endarterectomy and bovine pericardial patch angioplasty with revision of left limb of aortobifem and left SFA stent  -Left foot is well-perfused with palpable ATA; wounds of left foot appear to be nearly healed/healing well -Continue aspirin, Plavix, statin due to SFA stent placement -Check left lower extremity arterial duplex and ABI in 1 month -Encourage patient to continue to increase mobility   Emilie Rutter, PA-C [facility] [phone]  Clinic MD:  Lenell Antu

## 2021-06-10 DIAGNOSIS — I2510 Atherosclerotic heart disease of native coronary artery without angina pectoris: Secondary | ICD-10-CM | POA: Diagnosis not present

## 2021-06-10 DIAGNOSIS — I5022 Chronic systolic (congestive) heart failure: Secondary | ICD-10-CM | POA: Diagnosis not present

## 2021-06-10 DIAGNOSIS — I110 Hypertensive heart disease with heart failure: Secondary | ICD-10-CM | POA: Diagnosis not present

## 2021-06-10 DIAGNOSIS — Z48812 Encounter for surgical aftercare following surgery on the circulatory system: Secondary | ICD-10-CM | POA: Diagnosis not present

## 2021-06-10 DIAGNOSIS — I77811 Abdominal aortic ectasia: Secondary | ICD-10-CM | POA: Diagnosis not present

## 2021-06-10 DIAGNOSIS — I70228 Atherosclerosis of native arteries of extremities with rest pain, other extremity: Secondary | ICD-10-CM | POA: Diagnosis not present

## 2021-06-11 ENCOUNTER — Other Ambulatory Visit: Payer: Self-pay

## 2021-06-11 DIAGNOSIS — I5022 Chronic systolic (congestive) heart failure: Secondary | ICD-10-CM | POA: Diagnosis not present

## 2021-06-11 DIAGNOSIS — Z48812 Encounter for surgical aftercare following surgery on the circulatory system: Secondary | ICD-10-CM | POA: Diagnosis not present

## 2021-06-11 DIAGNOSIS — I110 Hypertensive heart disease with heart failure: Secondary | ICD-10-CM | POA: Diagnosis not present

## 2021-06-11 DIAGNOSIS — I70228 Atherosclerosis of native arteries of extremities with rest pain, other extremity: Secondary | ICD-10-CM | POA: Diagnosis not present

## 2021-06-11 DIAGNOSIS — I77811 Abdominal aortic ectasia: Secondary | ICD-10-CM | POA: Diagnosis not present

## 2021-06-11 DIAGNOSIS — I70229 Atherosclerosis of native arteries of extremities with rest pain, unspecified extremity: Secondary | ICD-10-CM

## 2021-06-11 DIAGNOSIS — I2510 Atherosclerotic heart disease of native coronary artery without angina pectoris: Secondary | ICD-10-CM | POA: Diagnosis not present

## 2021-06-23 ENCOUNTER — Other Ambulatory Visit: Payer: Self-pay

## 2021-06-23 ENCOUNTER — Encounter: Payer: Self-pay | Admitting: Cardiovascular Disease

## 2021-06-23 ENCOUNTER — Ambulatory Visit (INDEPENDENT_AMBULATORY_CARE_PROVIDER_SITE_OTHER): Payer: Medicare Other | Admitting: Cardiovascular Disease

## 2021-06-23 VITALS — BP 120/72 | HR 86 | Ht 66.0 in | Wt 138.0 lb

## 2021-06-23 DIAGNOSIS — R0989 Other specified symptoms and signs involving the circulatory and respiratory systems: Secondary | ICD-10-CM | POA: Diagnosis not present

## 2021-06-23 DIAGNOSIS — I200 Unstable angina: Secondary | ICD-10-CM

## 2021-06-23 DIAGNOSIS — I70229 Atherosclerosis of native arteries of extremities with rest pain, unspecified extremity: Secondary | ICD-10-CM | POA: Diagnosis not present

## 2021-06-23 NOTE — Progress Notes (Signed)
06/23/2021 [name] [name]   [birth year]  [id number]  Primary Physician [name] Cloud, MD Primary Cardiologist: [name] Gess MD [name] [name], [place]  HPI:  [name] [name] is a 75 y.o.  thin appearing married Caucasian male father of 1, grandfather 2 grandchildren who is accompanied by his wife [name] [name] today.  He is referred by Dr. Herbie [name], his cardiologist for evaluation of symptomatic PAD.  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  History factors include continued tobacco abuse smoking a pipe as well history of hypertension, diabetes and hyperlipidemia.  He had aortobifemoral bypass grafting at the [name] February 2014 and coronary artery bypass grafting x3 a month later.  He has ischemic cardiomyopathy and has an ICD implanted followed by Dr. Ladona [name].  He is complaining of left lower extremity claudication for the last 1 to 2 years.  Recent Doppler studies performed 01/09/2021 revealed right ABI 1.0 and a left of 0.93.  He had patent right and left limb of his bypass graft with a high-frequency signal of the origin of his left SFA.  He had a CTA performed that showed disease in his common femoral and SFA.  Ultimately underwent endarterectomy with profundoplasty of his left common femoral artery and proximal left SFA by Dr. Chestine [name] on 05/13/2021 along with left SFA stenting.  His symptoms have resolved.   Current Meds  Medication Sig   albuterol (VENTOLIN HFA) 108 (90 Base) MCG/ACT inhaler Inhale 1-2 puffs into the lungs every 6 (six) hours as needed for wheezing or shortness of breath.   Alpha-Lipoic Acid 600 MG TABS Take 1,200 mg by mouth 2 (two) times daily.   aspirin EC 81 MG tablet Take 81 mg by mouth daily.   atorvastatin (LIPITOR) 80 MG tablet Take 40 mg by mouth every evening.   carvedilol (COREG) 6.25 MG tablet Take 1 tablet (6.25 mg total) by mouth 2 (two) times daily with a meal.   cholecalciferol (VITAMIN D) 25 MCG (1000  UNIT) tablet Take 2,000 Units by mouth every evening.   clopidogrel (PLAVIX) 75 MG tablet Take 1 tablet (75 mg total) by mouth daily at 6 (six) AM.   dicloxacillin (DYNAPEN) 250 MG capsule Take 500 mg by mouth 3 (three) times daily.   ferrous sulfate 324 MG TBEC Take 324 mg by mouth daily with breakfast.   fluticasone (FLONASE) 50 MCG/ACT nasal spray Place 2 sprays into both nostrils daily as needed for allergies or rhinitis.   furosemide (LASIX) 20 MG tablet Take 1 tablet (20 mg total) by mouth daily. May take additional dose as needed for worsening swelling   glipiZIDE (GLUCOTROL) 5 MG tablet Take 5 mg by mouth 2 (two) times daily before a meal.   magnesium oxide (MAG-OX) 400 (241.3 Mg) MG tablet Take 1 tablet (400 mg total) by mouth daily.   metFORMIN (GLUCOPHAGE) 1000 MG tablet Take 1 tablet (1,000 mg total) by mouth 2 (two) times daily with a meal. Resume on 09/06/2016   nitroGLYCERIN (NITROSTAT) 0.4 MG SL tablet Place 1 tablet (0.4 mg total) under the tongue every 5 (five) minutes x 3 doses as needed for chest pain.   pantoprazole (PROTONIX) 40 MG tablet Take 1 tablet (40 mg total) by mouth daily.   Tiotropium Bromide Monohydrate (SPIRIVA RESPIMAT) 2.5 MCG/ACT AERS Inhale 2 puffs into the lungs at bedtime.   valsartan (DIOVAN) 40 MG tablet Take 40 mg by mouth in the morning.  No Known Allergies  Social History   Socioeconomic History   Marital status: Married    Spouse name: Not on file   Number of children: Not on file   Years of education: Not on file   Highest education level: Not on file  Occupational History   Not on file  Tobacco Use   Smoking status: Every Day    Types: Pipe   Smokeless tobacco: Former    Types: Snuff, Chew   Tobacco comments:    09/03/2016 "used smokeless tobacco in my teens; quit smoking cigarettes in the early 1980s; smokes pipe only"  Vaping Use   Vaping Use: Never used  Substance and Sexual Activity   Alcohol use: No   Drug use: No   Sexual  activity: Not on file  Other Topics Concern   Not on file  Social History Narrative   Routine activity around the house, but is limited more by musculoskeletal leg pain and weakness.      He enjoys smoking a pipe-long ago, he quit cigarettes   Social Determinants of Health   Financial Resource Strain: Not on file  Food Insecurity: Not on file  Transportation Needs: Not on file  Physical Activity: Not on file  Stress: Not on file  Social Connections: Not on file  Intimate Partner Violence: Not on file     Review of Systems: General: negative for chills, fever, night sweats or weight changes.  Cardiovascular: negative for chest pain, dyspnea on exertion, edema, orthopnea, palpitations, paroxysmal nocturnal dyspnea or shortness of breath Dermatological: negative for rash Respiratory: negative for cough or wheezing Urologic: negative for hematuria Abdominal: negative for nausea, vomiting, diarrhea, bright red blood per rectum, melena, or hematemesis Neurologic: negative for visual changes, syncope, or dizziness All other systems reviewed and are otherwise negative except as noted above.    Blood pressure 120/72, pulse 86, height 5\' 6"  (1.676 m), weight 138 lb (62.6 kg).  General appearance: alert and no distress Neck: no adenopathy, no JVD, supple, symmetrical, trachea midline, thyroid not enlarged, symmetric, no tenderness/mass/nodules, and soft right carotid bruit Lungs: clear to auscultation bilaterally Heart: regular rate and rhythm, S1, S2 normal, no murmur, click, rub or gallop Extremities: extremities normal, atraumatic, no cyanosis or edema Pulses: Palpable left pedal pulse Skin: Skin color, texture, turgor normal. No rashes or lesions Neurologic: Grossly normal  EKG not performed today  ASSESSMENT AND PLAN:   Critical lower limb ischemia [name] [name]) [name] [name] was initially sent to me by [name] [name] because of left lower extremity claudication.  He had aortobifemoral  bypass grafting at the [name] in February 2014.  He had Dopplers performed in our office 01/09/2021 revealing a right ABI of 1 and a left of 0.93.  He had a high-frequency signal in the origin of his left SFA.  I ended up doing a CTA that confirmed high-grade disease versus occlusion at the origin of his left SFA.  He ultimately underwent angiography intervention by Dr. 01/11/2021 on 05/13/2021 with left common femoral endarterectomy with profundoplasty and endarterectomy of the proximal SFA with bovine patch angioplasty.  He had revision of the left limb of his aortobifem as well as stenting of his left SFA.  His symptoms have completely resolved.  Dr. 05/15/2021 is going to follow-up his Doppler studies.     [name] Spore MD FACP,FACC,FAHA, [facility] 06/23/2021 3:29 PM

## 2021-06-23 NOTE — Patient Instructions (Signed)

## 2021-06-23 NOTE — Assessment & Plan Note (Signed)
[name] [name] was initially sent to me by Dr. Herbie Baltimore because of left lower extremity claudication.  He had aortobifemoral bypass grafting at the [facility] in February 2014.  He had Dopplers performed in our office 01/09/2021 revealing a right ABI of 1 and a left of 0.93.  He had a high-frequency signal in the origin of his left SFA.  I ended up doing a CTA that confirmed high-grade disease versus occlusion at the origin of his left SFA.  He ultimately underwent angiography intervention by Dr. Chestine Spore on 05/13/2021 with left common femoral endarterectomy with profundoplasty and endarterectomy of the proximal SFA with bovine patch angioplasty.  He had revision of the left limb of his aortobifem as well as stenting of his left SFA.  His symptoms have completely resolved.  Dr. Chestine Spore is going to follow-up his Doppler studies.

## 2021-06-30 ENCOUNTER — Encounter: Payer: Medicare Other | Admitting: Vascular Surgery

## 2021-06-30 ENCOUNTER — Encounter (HOSPITAL_COMMUNITY): Payer: Medicare Other

## 2021-07-02 ENCOUNTER — Other Ambulatory Visit: Payer: Self-pay

## 2021-07-02 ENCOUNTER — Ambulatory Visit (HOSPITAL_COMMUNITY)
Admission: RE | Admit: 2021-07-02 | Discharge: 2021-07-02 | Disposition: A | Discharge status: Home / Self Care | Payer: Medicare Other | Source: Ambulatory Visit | Attending: Cardiology | Admitting: Cardiology

## 2021-07-02 DIAGNOSIS — R0989 Other specified symptoms and signs involving the circulatory and respiratory systems: Secondary | ICD-10-CM | POA: Insufficient documentation

## 2021-07-02 DIAGNOSIS — I70229 Atherosclerosis of native arteries of extremities with rest pain, unspecified extremity: Secondary | ICD-10-CM | POA: Insufficient documentation

## 2021-07-06 ENCOUNTER — Ambulatory Visit (INDEPENDENT_AMBULATORY_CARE_PROVIDER_SITE_OTHER): Payer: Medicare Other

## 2021-07-06 DIAGNOSIS — Z95810 Presence of automatic (implantable) cardiac defibrillator: Secondary | ICD-10-CM | POA: Diagnosis not present

## 2021-07-06 DIAGNOSIS — I5022 Chronic systolic (congestive) heart failure: Secondary | ICD-10-CM | POA: Diagnosis not present

## 2021-07-08 NOTE — Progress Notes (Signed)
EPIC Encounter for ICM Monitoring  Patient Name: [name] [name] is a 75 y.o. male Date: 07/08/2021 Primary Care Physican: Dellia Cloud, MD Primary Cardiologist: Herbie Baltimore Electrophysiologist: Rae Roam Pacing: 98%     06/23/2021 Office Weight: 138 lbs     Spoke with wife and heart failure questions reviewed.  Pt asymptomatic for fluid accumulation and feeling well.  He was on vacation during decreased impedance in September   Corvue thoracic impedance suggesting normal fluids.     Prescribed:  Furosemide 20 mg Take 1 tablet (20 mg total) by mouth daily. May take additional dose as needed for worsening swelling Aspirin 81 mg daily   Labs: 05/14/2021 Creatinine 0.76, BUN 6, Potassium 4.2, Sodium 132, GFR >60 05/13/2021 Creatinine 0.93, BUN NA, Potassium 4.0, Sodium 132, GFR >60  05/11/2021 Creatinine 0.82, BUN 7, Potassium 4.4, Sodium 139, GFR >60  A complete set of results can be found in Results Review.   Recommendations:  No changes and encouraged to call if experiencing any fluid symptoms.   Follow-up plan: ICM clinic phone appointment on 08/10/2021.   91 day device clinic remote transmission 07/17/2021.     EP/Cardiology Office Visits:   10/07/2021 with Dr Herbie Baltimore   Copy of ICM check sent to Dr. Ladona Ridgel.    3 month ICM trend: 07/06/2021.    1 Year ICM trend:       Karie Soda, RN 07/08/2021 1:33 PM

## 2021-07-14 ENCOUNTER — Other Ambulatory Visit: Payer: Self-pay

## 2021-07-14 ENCOUNTER — Ambulatory Visit (INDEPENDENT_AMBULATORY_CARE_PROVIDER_SITE_OTHER): Payer: Self-pay | Admitting: Physician Assistant

## 2021-07-14 ENCOUNTER — Ambulatory Visit (INDEPENDENT_AMBULATORY_CARE_PROVIDER_SITE_OTHER)
Admission: RE | Admit: 2021-07-14 | Discharge: 2021-07-14 | Disposition: A | Discharge status: Home / Self Care | Payer: Medicare Other | Source: Ambulatory Visit | Attending: Vascular Surgery | Admitting: Vascular Surgery

## 2021-07-14 ENCOUNTER — Ambulatory Visit (HOSPITAL_COMMUNITY)
Admission: RE | Admit: 2021-07-14 | Discharge: 2021-07-14 | Disposition: A | Discharge status: Home / Self Care | Payer: Medicare Other | Source: Ambulatory Visit | Attending: Vascular Surgery | Admitting: Vascular Surgery

## 2021-07-14 VITALS — BP 123/73 | Temp 97.3°F | Resp 14 | Ht 66.0 in | Wt 141.3 lb

## 2021-07-14 DIAGNOSIS — I70222 Atherosclerosis of native arteries of extremities with rest pain, left leg: Secondary | ICD-10-CM

## 2021-07-14 DIAGNOSIS — I70229 Atherosclerosis of native arteries of extremities with rest pain, unspecified extremity: Secondary | ICD-10-CM | POA: Diagnosis not present

## 2021-07-14 DIAGNOSIS — I779 Disorder of arteries and arterioles, unspecified: Secondary | ICD-10-CM

## 2021-07-14 MED ORDER — CLOPIDOGREL BISULFATE 75 MG PO TABS: 75.0000 mg | ORAL_TABLET | Freq: Every day | ORAL | 2 refills | Status: DC

## 2021-07-14 NOTE — Progress Notes (Addendum)
Office Note     CC:  follow up Requesting Provider:  Dellia Cloud, MD  HPI: [name] [name] is a 75 y.o. ([birth year]) male who is s/p: left common femoral artery endarterectomy and bovine pericardial patch angioplasty with revision of left limb of aortobifem and left SFA stent. This was done by Dr. Chestine Spore on 05/07/2021. He had left heel and left great toe ulcers. He returns for non-invasive studies.  His daughter accompanies him today.  He denies lower extremity pain with exercise or rest pain.  They state his left foot ulcers continue to improve greatly.  His intervention, he has noticed an increase sensation of mild tingling and forefoot numbness.  He is compliant with aspirin and statin. Smokes pipe tobacco.  Past Medical History:  Diagnosis Date   Acid reflux    AICD (automatic cardioverter/defibrillator) present    a. 08/2016 St. Jude (serial Number [id number]) biventricular ICD.   Anemia    Chronic systolic CHF (congestive heart failure) (HCC)    a. 08/2016 Echo: EF 20%, diffuse HK, inf, post AK, mod-sev MR, sev dil LA, mod TR, PASP .   Complication of anesthesia    "was told I responded well to anesthesia"   COPD (chronic obstructive pulmonary disease) (HCC)    Coronary artery disease involving native coronary artery of native heart with angina pectoris (HCC)    a. 10/2012 MI after aortobifem bypass, found to have multivessel CAD -->CABG x3;  b. 08/2016 Cath: LM 60, LAD 71m, LCX 60ost/p, RCA 100p, VG->RPDA 50p, VG->OM1 ok, LIMA->LAD ok, EF 25%.   Essential hypertension 03/03/2012   History of blood transfusion 11/2012   "during his CABG"   History of gout X 1   History of stomach ulcers 1970s   Hyperlipidemia associated with type 2 diabetes mellitus (HCC) 09/16/2011   Ischemic cardiomyopathy    a. 08/2016 Echo: EF 20%, diffuse HK, inf, post AK;  b. 08/2016 St. Jude (serial Number [id number]) biventricular ICD.   Myocardial infarction [facility])    PAD (peripheral artery  disease) (HCC)    s/p aortobifemoral bypass 10/2012   Peripheral neuropathy    PTSD (post-traumatic stress disorder)    Type II diabetes mellitus (HCC)    Ventricular tachycardia    a. 08/2016 St. Jude (serial Number [id number]) biventricular ICD-->oral amio added.   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 Is now on chronic suppressive antibiotics with dicloxacillin    Past Surgical History:  Procedure Laterality Date   ABDOMINAL AORTOGRAM W/LOWER EXTREMITY Left 05/07/2021   Procedure: ABDOMINAL AORTOGRAM W/LOWER EXTREMITY;  Surgeon: [name], MD;  Location: [facility] INVASIVE CV LAB;  Service: Cardiovascular;  Laterality: Left;   AORTO-FEMORAL BYPASS GRAFT Bilateral 10/2012   [facility]   CARDIAC CATHETERIZATION  04/03/2013   [facility] [place] (? for abnormal Nuclear ST) --> no PCI, by report, patent grafts.   CARDIAC CATHETERIZATION N/A 09/02/2016   Procedure: Left Heart Cath and Cors/Grafts Angiography;  Surgeon: Kathleene Hazel, MD;  Location: [facility] INVASIVE CV LAB:: Ost LM 60% -> mid LAD 99% subCTO, ost-prox LCx 60%; prox RCA 100% CTO w/ (small) SVG->RPDA prox 50%; SVG->OM1 ok, LIMA->mLAD ok, EF 25% - complicated by VT during access - Shock x 2, Lidocaine & Amiodarone   CARDIAC CATHETERIZATION  11/09/2012   [facility]: due to Sustained VT post-Ob Ao-BiFem Bypass. (no Angina)   CHOLECYSTECTOMY  03/05/2012   Procedure:  LAPAROSCOPIC CHOLECYSTECTOMY WITH INTRAOPERATIVE CHOLANGIOGRAM;  Surgeon: Emelia Loron, MD;  Location: [facility] OR;  Service: General;  Laterality: N/A;   CORONARY ARTERY BYPASS GRAFT  11/2012   [facility]: CABG X 3 -> LIMA-mLAD, SVG-highOM1, SVG-RPDA (dRCA).   ENDARTERECTOMY FEMORAL Left 05/13/2021   Procedure: Left Femoral Endarterectomy WITH BOVINE PATCH PROFUNDOPLASTY, REVISION OF LEFT LIMB OF AORTO-BIFEMORAL GRAFT WITH DACRON GRAFT;  Surgeon: Cephus Shelling, MD;  Location: [facility] OR;  Service: Vascular;  Laterality: Left;   EP IMPLANTABLE DEVICE N/A 09/03/2016   Procedure: BI-VENTRICULAR IMPLANTABLE CARDIOVERTER DEFIBRILLATOR  (CRT-D);  Surgeon: Marinus Maw, MD;  Location: [facility] INVASIVE CV LAB;  Service: Cardiovascular;  Laterality: N/A;   INGUINAL HERNIA REPAIR Left 1990s   INSERTION OF ILIAC STENT Left 05/13/2021   Procedure: INSERTION OF SUPERFICIAL FEMORAL ARTERY STENT TIMES THREE AND ANGIOPLASTY;  Surgeon: Cephus Shelling, MD;  Location: [facility] OR;  Service: Vascular;  Laterality: Left;   INTRAOPERATIVE ARTERIOGRAM Left 05/13/2021   Procedure: INTRA OPERATIVE ARTERIOGRAM OF LEFT LOWER EXTREMITY;  Surgeon: Cephus Shelling, MD;  Location: [facility] OR;  Service: Vascular;  Laterality: Left;   STERNOTOMY  09/2013   [facility]): Sternal Wound Infection -> re-do sternotomy with metal place "sheild" placed. -Has chronic staph infection, on chronic suppressive medication   TRANSTHORACIC ECHOCARDIOGRAM  02/28/2017   EF remains 20p-25% severely reduced function. Diffuse hypokinesis. "Grade 1 diastolic dysfunction ". Severely calcified aortic valve with restricted mobility (functioning bicuspid) however no suggestion of stenosis.   UMBILICAL HERNIA REPAIR  03/05/2012   Procedure: HERNIA REPAIR UMBILICAL ADULT;  Surgeon: Emelia Loron, MD;  Location: [facility] OR;  Service: General;  Laterality: N/A;    Social History   Socioeconomic History   Marital status: Married    Spouse name: Not on file   Number of children: Not on file   Years of education: Not on file   Highest education level: Not on file  Occupational History   Not on file  Tobacco Use   Smoking status: Every Day    Types: Pipe   Smokeless tobacco: Former    Types: Snuff, Chew   Tobacco comments:    09/03/2016 "used smokeless tobacco in my teens; quit smoking cigarettes in the early 1980s; smokes pipe only"  Vaping Use   Vaping Use: Never used  Substance and Sexual Activity    Alcohol use: No   Drug use: No   Sexual activity: Not on file  Other Topics Concern   Not on file  Social History Narrative   Routine activity around the house, but is limited more by musculoskeletal leg pain and weakness.      He enjoys smoking a pipe-long ago, he quit cigarettes   Social Determinants of Health   Financial Resource Strain: Not on file  Food Insecurity: Not on file  Transportation Needs: Not on file  Physical Activity: Not on file  Stress: Not on file  Social Connections: Not on file  Intimate Partner Violence: Not on file   Family History  Problem Relation Age of Onset   Cancer Brother        esophageal    Current Outpatient Medications  Medication Sig Dispense Refill   albuterol (VENTOLIN HFA) 108 (90 Base) MCG/ACT inhaler Inhale 1-2 puffs into the lungs every 6 (six) hours as needed for wheezing or shortness of breath.     Alpha-Lipoic Acid 600 MG TABS Take 1,200 mg by mouth 2 (two) times daily.     aspirin EC 81  MG tablet Take 81 mg by mouth daily.     atorvastatin (LIPITOR) 80 MG tablet Take 40 mg by mouth every evening.     carvedilol (COREG) 6.25 MG tablet Take 1 tablet (6.25 mg total) by mouth 2 (two) times daily with a meal. 180 tablet 2   cholecalciferol (VITAMIN D) 25 MCG (1000 UNIT) tablet Take 2,000 Units by mouth every evening.     clopidogrel (PLAVIX) 75 MG tablet Take 1 tablet (75 mg total) by mouth daily at 6 (six) AM. 30 tablet 2   dicloxacillin (DYNAPEN) 250 MG capsule Take 500 mg by mouth 3 (three) times daily.     ferrous sulfate 324 MG TBEC Take 324 mg by mouth daily with breakfast.     fluticasone (FLONASE) 50 MCG/ACT nasal spray Place 2 sprays into both nostrils daily as needed for allergies or rhinitis.     furosemide (LASIX) 20 MG tablet Take 1 tablet (20 mg total) by mouth daily. May take additional dose as needed for worsening swelling 90 tablet 3   glipiZIDE (GLUCOTROL) 5 MG tablet Take 5 mg by mouth 2 (two) times daily before a  meal.     magnesium oxide (MAG-OX) 400 (241.3 Mg) MG tablet Take 1 tablet (400 mg total) by mouth daily. 30 tablet 6   metFORMIN (GLUCOPHAGE) 1000 MG tablet Take 1 tablet (1,000 mg total) by mouth 2 (two) times daily with a meal. Resume on 09/06/2016     nitroGLYCERIN (NITROSTAT) 0.4 MG SL tablet Place 1 tablet (0.4 mg total) under the tongue every 5 (five) minutes x 3 doses as needed for chest pain. 25 tablet 3   pantoprazole (PROTONIX) 40 MG tablet Take 1 tablet (40 mg total) by mouth daily. 90 tablet 2   Tiotropium Bromide Monohydrate (SPIRIVA RESPIMAT) 2.5 MCG/ACT AERS Inhale 2 puffs into the lungs at bedtime.     valsartan (DIOVAN) 40 MG tablet Take 40 mg by mouth in the morning.     No current facility-administered medications for this visit.    No Known Allergies   REVIEW OF SYSTEMS:   [X]  denotes positive finding, [ ]  denotes negative finding Cardiac  Comments:  Chest pain or chest pressure:    Shortness of breath upon exertion:    Short of breath when lying flat:    Irregular heart rhythm:        Vascular    Pain in calf, thigh, or hip brought on by ambulation:    Pain in feet at night that wakes you up from your sleep:     Blood clot in your veins:    Leg swelling:         Pulmonary    Oxygen at home:    Productive cough:     Wheezing:         Neurologic    Sudden weakness in arms or legs:     Sudden numbness in arms or legs:     Sudden onset of difficulty speaking or slurred speech:    Temporary loss of vision in one eye:     Problems with dizziness:         Gastrointestinal    Blood in stool:     Vomited blood:         Genitourinary    Burning when urinating:     Blood in urine:        Psychiatric    Major depression:         Hematologic  Bleeding problems:    Problems with blood clotting too easily:        Skin    Rashes or ulcers:        Constitutional    Fever or chills:      PHYSICAL EXAMINATION:  Vitals:   07/14/21 1350  BP: 123/73   Resp: 14  Temp: (!) 97.3 F (36.3 C)  TempSrc: Temporal  Weight: 141 lb 4.8 oz (64.1 kg)  Height: 5\' 6"  (1.676 m)    General:  WDWN in NAD; vital signs documented above Gait: Not observed HENT: WNL, normocephalic Pulmonary: normal non-labored breathing , without Rales, rhonchi,  wheezing Cardiac: regular HR Skin: without rashes Vascular Exam/Pulses: 2+ bilateral femoral artery pulses.  Weakly palpable left anterior tibial pulse. Extremities: without acute ischemic changes, without Gangrene , without cellulitis; without open wounds; several small abrasions of the anterior lower legs, all are healing.  Improved appearance of left great toe ulcer.  Left heel ulcers have healed. Musculoskeletal: no muscle wasting or atrophy  Neurologic: A&O X 3;  No focal weakness or paresthesias are detected Psychiatric:  The pt has Normal affect.       Non-Invasive Vascular Imaging:   07/14/2021 ABI/TBIToday's ABIToday's TBIPrevious ABIPrevious TBI  +-------+-----------+-----------+------------+------------+  Right  0.88       0.0        1.00        0.00          +-------+-----------+-----------+------------+------------+  Left   1.15       0.51       0.93        0.00          +-------+-----------+-----------+------------+------------+  Unable to obtain right great toe pressure.  Waveforms are biphasic Left great toe pressure is 66.  Waveforms are triphasic  Right ABIs appear decreased compared to prior study on 01/09/2021. Left  ABIs appear increased compared to prior study on 01/09/2021.     Summary:  Right: Resting right ankle-brachial index indicates mild right lower  extremity arterial disease. The right toe-brachial index is abnormal.   Left: Resting left ankle-brachial index is within normal range. No  evidence of significant left lower extremity arterial disease. The left  toe-brachial index is abnormal.    *See table(s) above for measurements and observations.        Preliminary    Left lower extremity arterial duplex:  Summary:  Left: Patent left limb AFBG, CFA / Prfunda endarterectomy site and left  SFA stent with no evidence of restenosis.      See table(s) above for measurements and observations.    Preliminary    ASSESSMENT/PLAN:: 75 y.o. male here for follow up for left common femoral artery endarterectomy and bovine pericardial patch angioplasty with revision of left limb of aortobifem and left SFA stent.  His left lower extremity pain resolved after intervention and his heel ulcers have completely healed.  He has 1 small area on the left great toe that is nearly healed.  He denies claudication or rest pain of the right lower extremity.  He does have chronic skin color changes to the right toes.  No skin breakdown.  Likely has some component of diabetic neuropathy. Urged him to continue to stay active.  We discussed continuing Plavix for the time being.  I will call in refills. Follow-up in 6 months with ABIs and lower extremity duplex.   67, PA-C [facility] [phone]  Clinic MD:   Drs. [phone]  and [employer]

## 2021-07-15 ENCOUNTER — Other Ambulatory Visit: Payer: Self-pay

## 2021-07-15 DIAGNOSIS — I779 Disorder of arteries and arterioles, unspecified: Secondary | ICD-10-CM

## 2021-07-17 ENCOUNTER — Ambulatory Visit (INDEPENDENT_AMBULATORY_CARE_PROVIDER_SITE_OTHER): Payer: Medicare Other

## 2021-07-17 DIAGNOSIS — I5022 Chronic systolic (congestive) heart failure: Secondary | ICD-10-CM

## 2021-07-17 LAB — CUP PACEART REMOTE DEVICE CHECK
Battery Remaining Longevity: 30 mo
Battery Remaining Percentage: 35 %
Battery Voltage: 2.9 V
Brady Statistic AP VP Percent: 1 %
Brady Statistic AP VS Percent: 1 %
Brady Statistic AS VP Percent: 97 %
Brady Statistic AS VS Percent: 1.3 %
Brady Statistic RA Percent Paced: 1 %
Date Time Interrogation Session: 20221028020016
HighPow Impedance: 54 Ohm
HighPow Impedance: 54 Ohm
Implantable Lead Implant Date: 20171215
Implantable Lead Implant Date: 20171215
Implantable Lead Implant Date: 20171215
Implantable Lead Location: 753858
Implantable Lead Location: 753859
Implantable Lead Location: 753860
Implantable Lead Model: 7122
Implantable Pulse Generator Implant Date: 20171215
Lead Channel Impedance Value: 340 Ohm
Lead Channel Impedance Value: 430 Ohm
Lead Channel Impedance Value: 480 Ohm
Lead Channel Pacing Threshold Amplitude: 0.5 V
Lead Channel Pacing Threshold Amplitude: 0.625 V
Lead Channel Pacing Threshold Amplitude: 0.875 V
Lead Channel Pacing Threshold Pulse Width: 0.5 ms
Lead Channel Pacing Threshold Pulse Width: 0.5 ms
Lead Channel Pacing Threshold Pulse Width: 0.5 ms
Lead Channel Sensing Intrinsic Amplitude: 1.5 mV
Lead Channel Sensing Intrinsic Amplitude: 11.7 mV
Lead Channel Setting Pacing Amplitude: 2 V
Lead Channel Setting Pacing Amplitude: 2 V
Lead Channel Setting Pacing Amplitude: 2 V
Lead Channel Setting Pacing Pulse Width: 0.5 ms
Lead Channel Setting Pacing Pulse Width: 0.5 ms
Lead Channel Setting Sensing Sensitivity: 0.5 mV
Pulse Gen Serial Number: 7368976

## 2021-07-27 NOTE — Progress Notes (Signed)
Remote ICD transmission.   

## 2021-08-10 ENCOUNTER — Ambulatory Visit (INDEPENDENT_AMBULATORY_CARE_PROVIDER_SITE_OTHER): Payer: Medicare Other

## 2021-08-10 DIAGNOSIS — Z95810 Presence of automatic (implantable) cardiac defibrillator: Secondary | ICD-10-CM

## 2021-08-10 DIAGNOSIS — I5022 Chronic systolic (congestive) heart failure: Secondary | ICD-10-CM | POA: Diagnosis not present

## 2021-08-11 ENCOUNTER — Telehealth: Payer: Self-pay

## 2021-08-11 NOTE — Telephone Encounter (Signed)
Remote ICM transmission received.  Attempted call to patient regarding ICM remote transmission and left detailed message per DPR.  Advised to return call for any fluid symptoms or questions. Next ICM remote transmission scheduled 09/22/2021.    

## 2021-08-11 NOTE — Progress Notes (Signed)
EPIC Encounter for ICM Monitoring  Patient Name: [name] [name] is a 75 y.o. male Date: 08/11/2021 Primary Care Physican: Dellia Cloud, MD Primary Cardiologist: Herbie Baltimore Electrophysiologist: Rae Roam Pacing: 98%     06/23/2021 Office Weight: 138 lbs     Attempted call to patient and unable to reach.  Left detailed message per DPR regarding transmission. Transmission reviewed.    Corvue thoracic impedance suggesting normal fluids.     Prescribed:  Furosemide 20 mg Take 1 tablet (20 mg total) by mouth daily. May take additional dose as needed for worsening swelling Aspirin 81 mg daily   Labs: 05/14/2021 Creatinine 0.76, BUN 6, Potassium 4.2, Sodium 132, GFR >60 05/13/2021 Creatinine 0.93, BUN NA, Potassium 4.0, Sodium 132, GFR >60  05/11/2021 Creatinine 0.82, BUN 7, Potassium 4.4, Sodium 139, GFR >60  A complete set of results can be found in Results Review.   Recommendations:  Left voice mail with ICM number and encouraged to call if experiencing any fluid symptoms.   Follow-up plan: ICM clinic phone appointment on 09/22/2021.   91 day device clinic remote transmission 10/16/2021.     EP/Cardiology Office Visits:   10/07/2021 with Dr Herbie Baltimore   Copy of ICM check sent to Dr. Ladona Ridgel.    3 month ICM trend: 08/10/2021.    12-14 Month ICM trend:       Karie Soda, RN 08/11/2021 4:55 PM

## 2021-08-25 ENCOUNTER — Telehealth: Payer: Self-pay

## 2021-08-25 ENCOUNTER — Telehealth: Payer: Self-pay | Admitting: Cardiology

## 2021-08-25 MED ORDER — FUROSEMIDE 20 MG PO TABS: 20.0000 mg | ORAL_TABLET | Freq: Every day | ORAL | 0 refills | Status: DC

## 2021-08-25 NOTE — Telephone Encounter (Signed)
Patient's daughter called stating that his refill script needs to be sent to his PCP first so the [place] will fill it.    *STAT* If patient is at the pharmacy, call can be transferred to refill team.   1. Which medications need to be refilled? (please list name of each medication and dose if known) furosemide (LASIX) 20 MG tablet  2. Which pharmacy/location (including street and city if local pharmacy) is medication to be sent to? Send to PCP office   3. Do they need a 30 day or 90 day supply? 90 day

## 2021-08-25 NOTE — Telephone Encounter (Signed)
Pt's daughter called to have pt's Plavix rx faxed to [facility]. It has been faxed. No further questions/concerns at this time.

## 2021-09-22 ENCOUNTER — Ambulatory Visit (INDEPENDENT_AMBULATORY_CARE_PROVIDER_SITE_OTHER): Payer: Medicare Other

## 2021-09-22 DIAGNOSIS — Z95810 Presence of automatic (implantable) cardiac defibrillator: Secondary | ICD-10-CM | POA: Diagnosis not present

## 2021-09-22 DIAGNOSIS — I5022 Chronic systolic (congestive) heart failure: Secondary | ICD-10-CM

## 2021-09-25 NOTE — Progress Notes (Signed)
EPIC Encounter for ICM Monitoring  Patient Name: [name] [name] is a 76 y.o. male Date: 09/25/2021 Primary Care Physican: Dellia Cloud, MD Primary Cardiologist: Herbie Baltimore Electrophysiologist: Rae Roam Pacing: 97%     06/23/2021 Office Weight: 138 lbs     Transmission reviewed.    Corvue thoracic impedance suggesting normal fluids.     Prescribed:  Furosemide 20 mg Take 1 tablet (20 mg total) by mouth daily. May take additional dose as needed for worsening swelling Aspirin 81 mg daily   Labs: 05/14/2021 Creatinine 0.76, BUN 6, Potassium 4.2, Sodium 132, GFR >60 05/13/2021 Creatinine 0.93, BUN NA, Potassium 4.0, Sodium 132, GFR >60  05/11/2021 Creatinine 0.82, BUN 7, Potassium 4.4, Sodium 139, GFR >60  A complete set of results can be found in Results Review.   Recommendations:  No changes.   Follow-up plan: ICM clinic phone appointment on 10/26/2021.   91 day device clinic remote transmission 10/16/2021.     EP/Cardiology Office Visits:   10/07/2021 with Dr Herbie Baltimore   Copy of ICM check sent to Dr. Ladona Ridgel.    3 month ICM trend: 09/22/2021.    12-14 Month ICM trend:     Karie Soda, RN 09/25/2021 1:28 PM

## 2021-10-07 ENCOUNTER — Encounter: Payer: Self-pay | Admitting: Cardiology

## 2021-10-07 ENCOUNTER — Other Ambulatory Visit: Payer: Self-pay

## 2021-10-07 ENCOUNTER — Ambulatory Visit (INDEPENDENT_AMBULATORY_CARE_PROVIDER_SITE_OTHER): Payer: Medicare Other | Admitting: Cardiology

## 2021-10-07 VITALS — BP 128/60 | HR 60 | Ht 66.0 in | Wt 145.6 lb

## 2021-10-07 DIAGNOSIS — E785 Hyperlipidemia, unspecified: Secondary | ICD-10-CM | POA: Diagnosis not present

## 2021-10-07 DIAGNOSIS — I10 Essential (primary) hypertension: Secondary | ICD-10-CM | POA: Diagnosis not present

## 2021-10-07 DIAGNOSIS — Z95828 Presence of other vascular implants and grafts: Secondary | ICD-10-CM | POA: Diagnosis not present

## 2021-10-07 DIAGNOSIS — Z951 Presence of aortocoronary bypass graft: Secondary | ICD-10-CM

## 2021-10-07 DIAGNOSIS — I25110 Atherosclerotic heart disease of native coronary artery with unstable angina pectoris: Secondary | ICD-10-CM | POA: Diagnosis not present

## 2021-10-07 DIAGNOSIS — I5020 Unspecified systolic (congestive) heart failure: Secondary | ICD-10-CM

## 2021-10-07 DIAGNOSIS — Z95810 Presence of automatic (implantable) cardiac defibrillator: Secondary | ICD-10-CM | POA: Diagnosis not present

## 2021-10-07 DIAGNOSIS — Z8679 Personal history of other diseases of the circulatory system: Secondary | ICD-10-CM

## 2021-10-07 DIAGNOSIS — I5022 Chronic systolic (congestive) heart failure: Secondary | ICD-10-CM | POA: Diagnosis not present

## 2021-10-07 DIAGNOSIS — I219 Acute myocardial infarction, unspecified: Secondary | ICD-10-CM | POA: Insufficient documentation

## 2021-10-07 DIAGNOSIS — I2510 Atherosclerotic heart disease of native coronary artery without angina pectoris: Secondary | ICD-10-CM

## 2021-10-07 DIAGNOSIS — E871 Hypo-osmolality and hyponatremia: Secondary | ICD-10-CM | POA: Diagnosis not present

## 2021-10-07 DIAGNOSIS — I420 Dilated cardiomyopathy: Secondary | ICD-10-CM

## 2021-10-07 DIAGNOSIS — E1169 Type 2 diabetes mellitus with other specified complication: Secondary | ICD-10-CM | POA: Diagnosis not present

## 2021-10-07 DIAGNOSIS — I214 Non-ST elevation (NSTEMI) myocardial infarction: Secondary | ICD-10-CM

## 2021-10-07 DIAGNOSIS — I480 Paroxysmal atrial fibrillation: Secondary | ICD-10-CM | POA: Diagnosis not present

## 2021-10-07 DIAGNOSIS — Z9889 Other specified postprocedural states: Secondary | ICD-10-CM

## 2021-10-07 DIAGNOSIS — I70229 Atherosclerosis of native arteries of extremities with rest pain, unspecified extremity: Secondary | ICD-10-CM

## 2021-10-07 DIAGNOSIS — I739 Peripheral vascular disease, unspecified: Secondary | ICD-10-CM | POA: Diagnosis not present

## 2021-10-07 DIAGNOSIS — I472 Ventricular tachycardia, unspecified: Secondary | ICD-10-CM

## 2021-10-07 DIAGNOSIS — E875 Hyperkalemia: Secondary | ICD-10-CM

## 2021-10-07 DIAGNOSIS — I200 Unstable angina: Secondary | ICD-10-CM

## 2021-10-07 MED ORDER — FUROSEMIDE 20 MG PO TABS: 10.0000 mg | ORAL_TABLET | Freq: Every day | ORAL | 3 refills | Status: DC

## 2021-10-07 NOTE — Patient Instructions (Addendum)
Medication Instructions:  Can use carnation instant breakfast in th place of protein shakes if you like   Continue taking  Lasix 10 mg ( 1/2 tablet of 20 mg ) daily may take an extra 1/2  a day if  increase swelling  or  3 lbs overnight   *If you need a refill on your cardiac medications before your next appointment, please call your pharmacy*   Lab Work: CMP Lipid HgbA1c If you have labs (blood work) drawn today and your tests are completely normal, you will receive your results only by: [facility] (if you have MyChart) OR A paper copy in the mail If you have any lab test that is abnormal or we need to change your treatment, we will call you to review the results.   Testing/Procedures: Not  needed   Follow-Up: At [facility], you and your health needs are our priority.  As part of our continuing mission to provide you with exceptional heart care, we have created designated Provider Care Teams.  These Care Teams include your primary Cardiologist (physician) and Advanced Practice Providers (APPs -  Physician Assistants and Nurse Practitioners) who all work together to provide you with the care you need, when you need it.     Your next appointment:   10 month(s) NOV 2023  The format for your next appointment:   In Person  Provider:   Glenetta Hew, MD

## 2021-10-07 NOTE — Progress Notes (Addendum)
Primary Care Provider: John Giovanni, MD Cardiologist: Glenetta Hew, MD Electrophysiologist: Cristopher Peru, MD Peripheral Vascular Cardiologist: Dr. Quay Burow, referred to  [facility] Vascular Surgery -> Dr. Carlis Abbott  Clinic Note: Chief Complaint  Patient presents with   Follow-up    Overall doing better.  No active cardiac symptoms.  PAD symptoms notably improved.   Coronary Artery Disease    No angina   Cardiomyopathy    No real CHF symptoms.  On lower dose of Lasix.  Question about low sodium and potassium level.   PAD    Recent PV surgery reviewed feels much better.  Left leg healing, claudication minimal   ===================================  ASSESSMENT/PLAN   Problem List Items Addressed This Visit       Cardiology Problems   Progressive angina ([place]) - atypical - related to arrhythmia (Chronic)    No further angina since his ICD placement for VT.  Cardiac catheterization 2017 showed stable coronaries with patent grafts.  Class I angina now on current dose of carvedilol and losartan.      Relevant Medications   furosemide (LASIX) 20 MG tablet   Coronary artery disease involving native coronary artery of native heart without angina pectoris - Primary (Chronic)    Multivessel CAD status post CABG with no anginal symptoms in the absence of arrhythmia or heart failure.  Heart catheterization was in 2013 showed patent grafts.  He will be due for ischemic evaluation in the the near future.  He has had so many tests and procedures done this past year that he asked for reprieve of studies at this point.  Since his not having true symptoms, organ to hold off for another year or so and reassess at that time what he wants to do follow-up Myoview.  Plan -> Continue Aggressive Guideline Directed Medical Therapy Remains on DAPT (with severe PAD, aortic disease and CAD, not unreasonable to stay on DAPT provided he has no bleeding issues) On high-dose high intensity statin  with well-controlled lipids On combination of beta-blocker and ARB-blood pressure well controlled, no angina Continue to talk about smoking cessation although he does not plan to stop smoking his pipe Continue to stay active try and exercise much as possible.  Encouraged walking and gardening. Healthy diet      Relevant Medications   furosemide (LASIX) 20 MG tablet   Other Relevant Orders   EKG 12-Lead   Lipid panel (Completed)   Comprehensive metabolic panel (Completed)   Hemoglobin A1c (Completed)   Ventricular tachycardia (Chronic)    No further episodes of VT noted on ICD interrogation. No longer on amiodarone.  Maintained on stable dose carvedilol.  ICD in place with CRT-D.  Monitored by Dr. Lovena Le.      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  On reasonable regimen, but not able to meet full guidelines because of availability of prescription coverage for Entresto and SGLT2 inhibitor.  Only able to tolerate low-dose ARB and on minimal dose furosemide. Unfortunately, with improvement of arrhythmia, EF did not improve that much.  This would suggest that the majority is ischemic.  Status post BiV ICD/CRT-D with minimal improvement in EF.      Relevant Medications   furosemide (LASIX) 20 MG tablet   Other Relevant Orders   EKG 12-Lead   Lipid panel (Completed)   Comprehensive metabolic  panel (Completed)   Hemoglobin A1c (Completed)   Chronic HFrEF (heart failure with reduced ejection fraction) (HCC) (Chronic)    Chronic HFrEF-NYHA Class I-II CHF symptoms mostly dyspneic related to pulmonary disease.  No real PND, orthopnea.  Edema is well controlled with low-dose Lasix 10 mg that he is not even taking every day.  Sodium level is a little low today, but the potassium is still little high.  For now continue current dose  of Lasix.  May need to titrate down his ARB dose and increase carvedilol.  Plan: On carvedilol 6.25 mg twice daily.  If continues to have hyperkalemia and hyponatremia, would need to back down losartan in which case would increase carvedilol to probably 12.5 mg twice daily Currently on valsartan 40 mg daily (in view of Entresto because of [facility] coverage) Not on SGLT2 inhibitor (not covered by [place]) On very low-dose Lasix at 10 mg daily.  He can probably cut this down to 4 to 5 days a week if necessary.  Would then use additional dose PRN worsening edema or weight gain.  We discussed sliding scale.      Relevant Medications   furosemide (LASIX) 20 MG tablet   Hyperlipidemia associated with type 2 diabetes mellitus ([place]) (Chronic)    Labs not been checked since last year in August.  LDL was 62.  Well-controlled. Rechecked today.  Again stable LDL at 62 with total cholesterol 45.  LDL 35 which is better than last year 21.  Tolerating current dose of atorvastatin 80 mg.  No myalgias.  Continue current med, but low threshold to convert to rosuvastatin 40 mg if he does have symptoms.  He is on metformin and glipizide for diabetes.  Unable to get SGLT2 inhibitor covered by [place].  Continue to try to push for prescription for SGLT2 inhibitor.  Perhaps this would even help [place] get rid of the low-dose Lasix.      Relevant Medications   furosemide (LASIX) 20 MG tablet   Other Relevant Orders   EKG 12-Lead   Lipid panel (Completed)   Comprehensive metabolic panel (Completed)   Hemoglobin A1c (Completed)   Essential hypertension (Chronic)    Blood pressure well controlled on current meds. Stable.  No change      Relevant Medications   furosemide (LASIX) 20 MG tablet   Other Relevant Orders   Comprehensive metabolic panel (Completed)   Hemoglobin A1c (Completed)   PAD (peripheral artery disease) (HCC) (Chronic)    Status post aortobifem bypass in 2014 followed by left SFA endarterectomy and  profundoplasty with graft revision and left SFA stenting.  Notably improved claudication.  No resting pain.  Wound healing.  Carotid Dopplers performed did not show any significant findings.  Vascular Dopplers being followed by vascular surgery. Appreciate Dr. Carlis Abbott and Dr. Stanford Breed      Relevant Medications   furosemide (LASIX) 20 MG tablet   Other Relevant Orders   Lipid panel (Completed)   Comprehensive metabolic panel (Completed)   Hemoglobin A1c (Completed)   Paroxysmal atrial fibrillation (HCC) (Chronic)    No evidence to suggest recurrence of A. fib on ICD interrogation.  With no recurrence of A. fib, we are maintaining him with aspirin and Plavix and have not switched to DOAC.      Relevant Medications   furosemide (LASIX) 20 MG tablet   Other Relevant Orders   EKG 12-Lead   Lipid panel (Completed)   Comprehensive metabolic panel (Completed)   Hemoglobin A1c (Completed)   Critical  lower limb ischemia (HCC) (Chronic)    Status post extensive surgical revision of the left SFA (endarterectomy) with profundoplasty and left SFA stent.  Now with healing wound and no more resting pain.  Claudication is also significantly improved.  Dopplers being followed by vascular surgery.      Relevant Medications   furosemide (LASIX) 20 MG tablet   Myocardial infarction (HCC) (Chronic)    Distant history of MI X identified as ischemic VT following his initial aortobifem surgery in 2014.  Not have multivessel disease.  His next ischemic event was in 2017 when he came in with acute heart failure again related to VT.  Severe ischemic cardiomyopathy.  Multivessel CABG.  EF has improved some but still very low.      Relevant Medications   furosemide (LASIX) 20 MG tablet     Other   Status post aortobifemoral bypass surgery (Chronic)    Abdominal aortoiliac occlusive atherosclerotic disease with aortobifem back in 2014, revision of the right leg extension.  Otherwise graft was patent  on CT angiogram.  Continue aggressive cardiovascular risk factor modification as is appropriate for his CAD/PAD: Aspirin and Plavix -> defer timing of combination therapy to vascular surgery High-dose statin Beta-blocker and ARB Close glycemic control per PCP       Relevant Orders   EKG 12-Lead   Lipid panel (Completed)   Comprehensive metabolic panel (Completed)   Hemoglobin A1c (Completed)   Hyperkalemia - resolved    Potassium 5.3.  Would like to continue current dose of Lasix because his sodium level is also a little low.  We may need to back off on ARB dose if potassium level maintained stable readings over 5.  In that case we would increase carvedilol dose.      S/P AAA repair   Hyponatremia (Chronic)    Chemistry panel ordered today shows a sodium of 131.  Borderline low.  We will continue to follow.  I am reluctant of fully stopping his diuretic, however if we can get him on SGLT2 inhibitor, we may be able to potentially stop standing dose of furosemide and make it simply PRN.      Relevant Orders   EKG 12-Lead   Lipid panel (Completed)   Comprehensive metabolic panel (Completed)   Hemoglobin A1c (Completed)   S/P CABG x 3 (Chronic)    We talked about ischemic evaluation with Myoview.  Last visit, he indicated that he wanted to hold off unless he has symptoms.  Today he indicated that he has had some many studies and procedures done in the last year that he wants a break.  Agrees to discuss next visit.  Not sure how much help him I would be in the absence of symptoms because it would be read as abnormal with reduced EF.  Unfortunately we do not have a baseline Myoview since his CABG.      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 male with a PMH notable for CAD-CABG, ICM/CHRONIC HFrEF-class II (s/p -CRT-D, with history of ATP for sustained VT), PAF (history of amiodarone pulmonary toxicity), severe COPD, and and severe Ao-Iliac &  PAD ( s/p aortobifem bypass - recent LCFA Endarterectomy & LSFA PTA-Stent) who presents today for 35-month follow-up  CARDIOVASCULAR HISTORY February 2014: Aortobifem ([place]) Perioperative MI/ischemic VT-> Cath with Multivessel CAD March 2014: CABG x3 ([place]) Relook cath July 2014 Postop CABG complicated by sternal wound infection => redo sternotomy with steel plate insertion January 2015. (On lifelong dicloxacillin) Lost to follow-up after CABG -> PA never scheduled follow-up with Cardiology December 2017: [place] admission -> Acute Combined CHF/unstable angina/VT Echo: EF 20%, sustained VT on monitor Cath: Patent grafts and no obvious culprit lesion. CRT-D/BiV ICD-Dr. Lovena Le April 2021-2 episodes of sustained VT-successful conversion with ATP.  Occurred while working out in the yard.  No notable symptoms. Ischemic Cardiomyopathy-baseline NYHA Class II CHF symptoms; CCA Class I Angina symptoms Unable to convert to Entresto-not covered by [facility], despite guideline directed therapy) => on valsartan Unable to get SGLT2 inhibitor-not covered by [facility], despite guideline directed therapy PAF-amiodarone discontinued secondary to concerns with possible pulmonary toxicity PAD - 04/2021 -> LCFA Endarterectomy w/ Profundoplasty, Graft Revision with 10 mm conduit, LSFA stent. (For Critical Limb Ischemia - w/ LCFA/PFA & SFA occlusion after graft insertion).   I last saw [name] [name] on 12/29/2020 for routine follow-up-he noted that his activity is more limited by leg discomfort and weakness mostly in the left leg.  No angina.  NYHA class I to CHF  symptoms. Echocardiogram and vascular ultrasound/TBI ordered Unfortunately still not able to get him on Entresto or SGLT2 inhibitor through the [place], despite this being guideline directed therapy -> maintained on ARB  => Seen by Dr. Gwenlyn Found on 01/28/2021 for abnormal ABIs and ongoing claudication symptoms.  There was a high-frequency signal at the origin of the left SFA. => CT Angiogram abdomen pelvis with runoff ordered (not done until August). => Based on results, referred to vascular surgery -> in the interim, he developed progressively worsening resting leg pain with poorly healing foot ulcer.  Concern for critical limb ischemia => eventually underwent left CFA endarterectomy with profundoplasty, aortobifem bypass revision with left SFA stenting-noted below. Followed up on 06/23/2021 -> symptoms resolved.  Carotid Dopplers ordered. Follow on lower extremity and carotid Dopplers followed by vascular surgery.  => Seen by Dr. Lovena Le on 02/05/2021: Noted that he is doing well well with no symptomatic atrial fibs and no ATP.  Notes that he gets tired if he walks too long in the garden.  Not planning as many tomatoes as before.  (Suspected he has maybe 3 years left of battery longevity..  Recent Hospitalizations:  05/07/2021-(Dr. Stanford Breed): Abdominal Aortogram with Lower Extremity Runoff 05/13/2021-(DrCarlis Abbott): Left Common Femoral endarterectomy with profundoplasty and endarterectomy of the proximal SFA using bovine pericardial patch angioplasty, revision of left limb of aortobifem with 10 mm Dacron interposition, left SFA angioplasty with stent placement(6 mm x 100 mm Eluvia x2 and 6 mm x 120 mm Eluvia all postdilated with a 5 mm angioplasty balloon)  Reviewed  CV studies:    The following studies were reviewed today: (if available, images/films reviewed: From Epic Chart or Care Everywhere) Vascular ultrasound-ABI with/without TBI: Right ABI within normal limits, waveforms at the ankle suggest some component of  arterial occlusive disease.  Abnormal TBI.  Left ABI and TBI abnormal.  ABI suggests mild left lower extremity arterial disease.  High-frequency signal in left SFA ostia and profunda artery..  TTE 02/05/2021: EF 25 to 30%.  Severely depressed EF.  Global HK with worst basal to mid  anterolateral and inferolateral as well as anterior basal inferior walls.  GR 1 DD.  Mildly reduced RV function.  Functionally bicuspid aortic valve with moderate calcification but no stenosis.  Overall slightly improved EF.  CTA Abdomen Pelvis-Bilateral Lower Extremity 04/20/2021: Aortic atherosclerosis with aortobifem surgical changes.  Notable findings = advanced left-sided atherosclerosis involving CFA with dense calcified plaque contributing to blooming artifact.  Limits evaluation of distal CFA and proximal SFA.  PV Procedure 05/07/2021: Patent aortobifem bypass from femoral error into iliac arteries.  Right internal iliac stent patent. Left Lower Extremity: Recommend femoral endarterectomy and profundoplasty plus or minus femoropopliteal bypass. CFA occluded distal to the left limb of the aortobifem bypass with retrograde flow into the left external iliac that fills the pelvis via collaterals. Profunda Femoris Artery: Occluded near the origin.  Reconstitutes early in the course. SFA: Occluded near origin, reconstitutes early in course.  Heavily diseased throughout thigh (greatest> 75%) Popliteal Artery (POP A): 50% short segment above-knee and patent behind and below-knee AT patent with diffuse disease Patent TP trunk -> peroneal artery patent with diffuse disease, PTA occluded.  05/13/2021: PV surgery noted above  Interval History:   [name] [name] returns here today for almost an annual follow-up with me having had multiple different procedures done since I last saw him.  He says that his leg feels much better after the peripheral vascular procedure.  The pain in his leg is pretty much all gone.  The wound is  healed.  He still has some neuropathy symptoms with almost sounds like radicular pain from his buttock down to his left lower leg and foot.  Now that he does not have as much leg pain, he is walking a whole lot more.  He does use a walker some.  Patient doing much better.  Because of the neuropathy symptoms he still has poor balance, but has not fallen.  The other major thing is that he is sleeping much better without having foot pain that was keeping him up at night.  He is sleeping now 8 to 10 hours a day.  The foot wound is also pretty much healed  He does not really exercise that much but he has been trying to do some more.  He usually gets short of breath when he does more than he routinely would do.  He is trying to do as much as he can outdoors to stay active. He has baseline dyspnea that gets worse with exertion, does not have really changed, infected the ability to walk more, he feels better.  No PND, orthopnea or edema.  He has cut down to only taking 1/2 tablet (10 mg) furosemide but not even every day.  He says that he will get notifications about his volume status from the ICD interrogation that will say his volume was up the previous week and then is back to normal again.  He is pretty good about following his weights. He denies any anginal symptoms with rest or exertion.  Claudication is also notably improved.  No recurrent palpitation symptoms to suggest either VT or A. fib.  CV Review of Symptoms (Summary) Cardiovascular RMuch improved claudication without resting painOS: positive for - dyspnea on exertion, shortness of breath, and -trivial/well-controlled edema with stable weights. . negative for - chest pain, irregular heartbeat, orthopnea, palpitations, paroxysmal nocturnal dyspnea, rapid heart rate, or -syncope/near syncope, TIA/amaurosis fugax. Much improved claudication without resting pain.  REVIEWED OF SYSTEMS   Review of Systems  Constitutional:  Negative for  malaise/fatigue  (Energy level actually is better now that he is able to for activity and sleep better.) and weight loss.  HENT:  Negative for congestion and nosebleeds.   Respiratory:  Positive for cough (Occasional, but not significant.), shortness of breath (Pretty much at baseline) and wheezing.   Cardiovascular:        Per HPI  Gastrointestinal:  Negative for blood in stool and melena.  Genitourinary:  Negative for hematuria.  Musculoskeletal:  Positive for back pain and joint pain. Negative for falls (Although he does have somewhat unsteady gait with poor balance.).  Neurological:  Positive for weakness (Legs are still weak.). Negative for dizziness (Sometimes when he stands up too quickly he is dizzy, but mostly just has poor balance.).  Psychiatric/Behavioral:  Positive for memory loss. Negative for depression. The patient is not nervous/anxious and does not have insomnia (Sleeping much better-8 to 10 hours a night).    I have reviewed and (if needed) personally updated the patient's problem list, medications, allergies, past medical and surgical history, social and family history.   PAST MEDICAL HISTORY   Past Medical History:  Diagnosis Date   Acid reflux    AICD (automatic cardioverter/defibrillator) present    a. 08/2016 St. Jude (serial Number [id number]) biventricular ICD.   Anemia    Chronic systolic CHF (congestive heart failure) ([place])    a. 08/2016 Echo: EF 20%, diffuse HK, inf, post AK, mod-sev MR, sev dil LA, mod TR, PASP 39mmHg.   Complication of anesthesia    "was told I responded well to anesthesia"   COPD (chronic obstructive pulmonary disease) (HCC)    Coronary artery disease involving native coronary artery of native heart with angina pectoris ([place])    a. 10/2012 MI after aortobifem bypass, found to have multivessel CAD -->CABG x3;  b. 08/2016 Cath: LM 60, LAD 73m, LCX 60ost/p, RCA 100p, VG->RPDA 50p, VG->OM1 ok, LIMA->LAD ok, EF 25%.   Essential hypertension 03/03/2012   History of  blood transfusion 11/2012   "during his CABG"   History of gout X 1   History of stomach ulcers 1970s   Hyperlipidemia associated with type 2 diabetes mellitus ([place]) 09/16/2011   Ischemic cardiomyopathy    a. 08/2016 Echo: EF 20%, diffuse HK, inf, post AK;  b. 08/2016 St. Jude (serial Number [id number]) biventricular ICD.   Myocardial infarction [facility])    PAD (peripheral artery disease) (HCC)    s/p aortobifemoral bypass 10/2012   Peripheral neuropathy    PTSD (post-traumatic stress disorder)    Type II diabetes mellitus (HCC)    Ventricular tachycardia    a. 08/2016 St. Jude (serial Number [id number]) biventricular ICD-->oral amio added.   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 Is now on chronic suppressive antibiotics with dicloxacillin    PAST SURGICAL HISTORY   Past Surgical History:  Procedure Laterality Date   ABDOMINAL AORTOGRAM W/LOWER EXTREMITY Left 05/07/2021   Procedure: ABDOMINAL AORTOGRAM W/LOWER EXTREMITY;  Surgeon: Cherre Robins, MD;  Location: [place] CV [facility];  Service: Cardiovascular;  Laterality: Left;   AORTO-FEMORAL BYPASS GRAFT Bilateral 10/2012   [facility]   CARDIAC CATHETERIZATION  04/03/2013   [place] (? for abnormal Nuclear ST) --> no PCI, by report, patent grafts.   CARDIAC CATHETERIZATION N/A 09/02/2016   Procedure: Left Heart Cath and Cors/Grafts Angiography;  Surgeon: Burnell Blanks, MD;  Location: [facility] INVASIVE CV LAB::  Ost LM 60% -> mid LAD 99% subCTO, ost-prox LCx 60%; prox RCA 100% CTO w/ (small) SVG->RPDA prox 50%; SVG->OM1 ok, LIMA->mLAD ok, EF 123456 - complicated by VT during access - Shock x 2, Lidocaine & Amiodarone   CARDIAC CATHETERIZATION  11/09/2012   [facility]: due to Sustained VT post-Ob Ao-BiFem Bypass. (no Angina)   CHOLECYSTECTOMY  03/05/2012   Procedure: LAPAROSCOPIC CHOLECYSTECTOMY WITH INTRAOPERATIVE  CHOLANGIOGRAM;  Surgeon: Rolm Bookbinder, MD;  Location: [place];  Service: General;  Laterality: N/A;   CORONARY ARTERY BYPASS GRAFT  11/2012   [place] [facility]: CABG X 3 -> LIMA-mLAD, SVG-highOM1, SVG-RPDA (dRCA).   ENDARTERECTOMY FEMORAL Left 05/13/2021   Procedure: Left Femoral Endarterectomy WITH BOVINE PATCH PROFUNDOPLASTY, REVISION OF LEFT LIMB OF AORTO-BIFEMORAL GRAFT WITH DACRON GRAFT;  Surgeon: Marty Heck, MD;  Location: [place];  Service: Vascular;  Laterality: Left;   EP IMPLANTABLE DEVICE N/A 09/03/2016   Procedure: BI-VENTRICULAR IMPLANTABLE CARDIOVERTER DEFIBRILLATOR  (CRT-D);  Surgeon: Evans Lance, MD;  Location: [place] [facility];  Service: Cardiovascular;  Laterality: N/A;   INGUINAL HERNIA REPAIR Left 1990s   INSERTION OF ILIAC STENT Left 05/13/2021   Procedure: INSERTION OF SUPERFICIAL FEMORAL ARTERY STENT TIMES THREE AND ANGIOPLASTY;  Surgeon: Marty Heck, MD;  Location: [place];  Service: Vascular;  Laterality: Left;   INTRAOPERATIVE ARTERIOGRAM Left 05/13/2021   Procedure: INTRA OPERATIVE ARTERIOGRAM OF LEFT LOWER EXTREMITY;  Surgeon: Marty Heck, MD;  Location: [place];  Service: Vascular;  Laterality: Left;   STERNOTOMY  09/2013   [facility]): Sternal Wound Infection -> re-do sternotomy with metal place "sheild" placed. -Has chronic staph infection, on chronic suppressive medication   TRANSTHORACIC ECHOCARDIOGRAM  02/28/2017   EF remains 20p-25% severely reduced function. Diffuse hypokinesis. "Grade 1 diastolic dysfunction ". Severely calcified aortic valve with restricted mobility (functioning bicuspid) however no suggestion of stenosis.   TRANSTHORACIC ECHOCARDIOGRAM  01/2021   EF 25 to 30%.  Severely depressed EF.  Global HK with worst basal to mid anterolateral and inferolateral as well as anterior basal inferior walls.  GR 1 DD.  Mildly reduced RV function.  Functionally bicuspid aortic valve with moderate calcification but no stenosis.  Overall  slightly improved EF.   UMBILICAL HERNIA REPAIR  03/05/2012   Procedure: HERNIA REPAIR UMBILICAL ADULT;  Surgeon: Rolm Bookbinder, MD;  Location: [place];  Service: General;  Laterality: N/A;     Cath 08/2016.   Immunization History  Administered Date(s) Administered   Influenza Split 09/17/2011   Influenza-Unspecified 09/20/2018   Moderna Sars-Covid-2 Vaccination 10/10/2019, 11/09/2019   Pneumococcal-Unspecified 11/12/2014    MEDICATIONS/ALLERGIES   Current Meds  Medication Sig   albuterol (VENTOLIN HFA) 108 (90 Base) MCG/ACT inhaler Inhale 1-2 puffs into the lungs every 6 (six) hours as needed for wheezing or shortness of breath.   Alpha-Lipoic Acid 600 MG TABS Take 1,200 mg by mouth 2 (two) times daily.   aspirin EC 81 MG tablet Take 81 mg by mouth daily.   atorvastatin (LIPITOR) 80 MG tablet Take 40 mg by mouth every evening.   carvedilol (COREG) 6.25 MG tablet Take 1 tablet (6.25 mg total) by mouth 2 (two) times daily with a meal.   cholecalciferol (VITAMIN D) 25 MCG (1000 UNIT) tablet Take 2,000 Units by mouth every evening.   clopidogrel (PLAVIX) 75 MG tablet Take 1 tablet (75 mg total) by mouth daily at 6 (six) AM.   dicloxacillin (DYNAPEN) 250 MG capsule Take 500 mg by mouth 3 (three)  times daily.   ferrous sulfate 324 MG TBEC Take 324 mg by mouth daily with breakfast.   fluticasone (FLONASE) 50 MCG/ACT nasal spray Place 2 sprays into both nostrils daily as needed for allergies or rhinitis.   glipiZIDE (GLUCOTROL) 5 MG tablet Take 5 mg by mouth 2 (two) times daily before a meal.   magnesium oxide (MAG-OX) 400 (241.3 Mg) MG tablet Take 1 tablet (400 mg total) by mouth daily.   metFORMIN (GLUCOPHAGE) 1000 MG tablet Take 1 tablet (1,000 mg total) by mouth 2 (two) times daily with a meal. Resume on 09/06/2016   nitroGLYCERIN (NITROSTAT) 0.4 MG SL tablet Place 1 tablet (0.4 mg total) under the tongue every 5 (five) minutes x 3 doses as needed for chest pain.   pantoprazole  (PROTONIX) 40 MG tablet Take 1 tablet (40 mg total) by mouth daily.   Tiotropium Bromide Monohydrate (SPIRIVA RESPIMAT) 2.5 MCG/ACT AERS Inhale 2 puffs into the lungs at bedtime.   valsartan (DIOVAN) 40 MG tablet Take 40 mg by mouth in the morning.   [DISCONTINUED] furosemide (LASIX) 20 MG tablet Take 1 tablet (20 mg total) by mouth daily. May take additional dose as needed for worsening swelling (Patient taking differently: Take 10 mg by mouth daily. May take additional dose as needed for worsening swelling)    No Known Allergies  SOCIAL HISTORY/FAMILY HISTORY   Reviewed in Epic:  Pertinent findings:  Social History   Tobacco Use   Smoking status: Every Day    Types: Pipe   Smokeless tobacco: Former    Types: Snuff, Chew   Tobacco comments:    09/03/2016 "used smokeless tobacco in my teens; quit smoking cigarettes in the early 1980s; smokes pipe only"  Vaping Use   Vaping Use: Never used  Substance Use Topics   Alcohol use: No   Drug use: No   Social History   Social History Narrative   Routine activity around the house, but is limited more by musculoskeletal leg pain and weakness.      He enjoys smoking a pipe-long ago, he quit cigarettes    OBJCTIVE -PE, EKG, labs   Wt Readings from Last 3 Encounters:  10/07/21 145 lb 9.6 oz (66 kg)  07/14/21 141 lb 4.8 oz (64.1 kg)  06/23/21 138 lb (62.6 kg)    Physical Exam: BP 128/60    Pulse 60    Ht 5\' 6"  (1.676 m)    Wt 145 lb 9.6 oz (66 kg)    BMI 23.50 kg/m  Physical Exam Vitals reviewed.  Constitutional:      Appearance: He is normal weight.     Comments: Mildly ill-appearing, but nontoxic.  No acute distress.  HENT:     Head: Normocephalic and atraumatic.  Neck:     Vascular: Carotid bruit (Soft right bruit) present. No JVD.  Cardiovascular:     Rate and Rhythm: Normal rate and regular rhythm. Occasional Extrasystoles are present.    Chest Wall: PMI is displaced (Mild lateral displacement.).     Pulses: Decreased  pulses (Diminished, but definitely palpable bilateral pedal pulses.).     Heart sounds: Heart sounds are distant. Murmur (To have a 3/6 SEM at RUSB.-Neck) heard.    No friction rub. Gallop present. S4 sounds present.     Comments: Normal S1 with borderline split S2. Pulmonary:     Effort: Pulmonary effort is normal. No respiratory distress.     Breath sounds: Wheezing (Mild expiratory wheeze) and rhonchi (Coarse rhonchorous breath sounds  throughout) present. No rales.  Chest:     Chest wall: No tenderness.  Musculoskeletal:        General: No swelling.     Cervical back: Normal range of motion and neck supple.  Skin:    General: Skin is warm and dry.     Coloration: Skin is not jaundiced or pale.  Neurological:     General: No focal deficit present.     Mental Status: He is alert and oriented to person, place, and time. Mental status is at baseline.  Psychiatric:        Mood and Affect: Mood normal.        Behavior: Behavior normal.        Thought Content: Thought content normal.        Judgment: Judgment normal.     Adult ECG Report  Rate: 60 ;  Rhythm:  A sensed, V paced. ;   Narrative Interpretation: Stable BiV pacing  Recent Labs: Ordered today, reviewed.  Well-controlled. Lab Results  Component Value Date   CHOL 115 10/07/2021   HDL 35 (L) 10/07/2021   LDLCALC 62 10/07/2021   TRIG 91 10/07/2021   CHOLHDL 3.3 10/07/2021   Lab Results  Component Value Date   CREATININE 0.86 10/07/2021   BUN 8 10/07/2021   NA 131 (L) 10/07/2021   K 5.3 (H) 10/07/2021   CL 93 (L) 10/07/2021   CO2 26 10/07/2021   CBC Latest Ref Rng & Units 05/14/2021 05/13/2021 05/13/2021  WBC 4.0 - 10.5 K/uL 13.7(H) 15.4(H) -  Hemoglobin 13.0 - 17.0 g/dL 10.8(L) 12.2(L) 12.9(L)  Hematocrit 39.0 - 52.0 % 31.8(L) 37.2(L) 38.0(L)  Platelets 150 - 400 K/uL 243 267 -    Lab Results  Component Value Date   HGBA1C 7.2 (H) 10/07/2021   Lab Results  Component Value Date   TSH 6.87 (H) 09/08/2016     ==================================================  COVID-19 Education: The signs and symptoms of COVID-19 were discussed with the patient and how to seek care for testing (follow up with PCP or arrange E-visit).    I spent a total of 38 minutes with the patient spent in direct patient consultation.  Additional time spent with chart review  / charting (studies, outside notes, etc): 35 min -> Multiple different clinic visits, procedure notes and op notes reviewed.  Updated above. Total Time: 73 min  Current medicines are reviewed at length with the patient today.  (+/- concerns) asked about doses for Lasix.  This visit occurred during the SARS-CoV-2 public health emergency.  Safety protocols were in place, including screening questions prior to the visit, additional usage of staff PPE, and extensive cleaning of exam room while observing appropriate contact time as indicated for disinfecting solutions.  Notice: This dictation was prepared with Dragon dictation along with smart phrase technology. Any transcriptional errors that result from this process are unintentional and may not be corrected upon review.  Studies Ordered:   Orders Placed This Encounter  Procedures   Lipid panel   Comprehensive metabolic panel   Hemoglobin A1c   EKG 12-Lead    Patient Instructions / Medication Changes & Studies & Tests Ordered   Patient Instructions  Medication Instructions:  Can use carnation instant breakfast in th place of protein shakes if you like   Continue taking  Lasix 10 mg ( 1/2 tablet of 20 mg ) daily may take an extra 1/2  a day if  increase swelling  or  3 lbs overnight   *  If you need a refill on your cardiac medications before your next appointment, please call your pharmacy*   Lab Work: CMP Lipid HgbA1c If you have labs (blood work) drawn today and your tests are completely normal, you will receive your results only by: [place] (if you have MyChart) OR A paper  copy in the mail If you have any lab test that is abnormal or we need to change your treatment, we will call you to review the results.   Testing/Procedures: Not  needed   Follow-Up: At [facility], you and your health needs are our priority.  As part of our continuing mission to provide you with exceptional heart care, we have created designated Provider Care Teams.  These Care Teams include your primary Cardiologist (physician) and Advanced Practice Providers (APPs -  Physician Assistants and Nurse Practitioners) who all work together to provide you with the care you need, when you need it.     Your next appointment:   10 month(s) NOV 2023  The format for your next appointment:   In Person  Provider:   Glenetta Hew, MD      Glenetta Hew, M.D., M.S. Interventional Cardiologist   Pager # [phone] Phone # [phone] [address]. [place], [place] [address]   Thank you for choosing Heartcare at [facility]!!

## 2021-10-08 LAB — COMPREHENSIVE METABOLIC PANEL
ALT: 9 IU/L (ref 0–44)
AST: 12 IU/L (ref 0–40)
Albumin/Globulin Ratio: 2.8 — ABNORMAL HIGH (ref 1.2–2.2)
Albumin: 4.7 g/dL (ref 3.7–4.7)
Alkaline Phosphatase: 74 IU/L (ref 44–121)
BUN/Creatinine Ratio: 9 — ABNORMAL LOW (ref 10–24)
BUN: 8 mg/dL (ref 8–27)
Bilirubin Total: 0.4 mg/dL (ref 0.0–1.2)
CO2: 26 mmol/L (ref 20–29)
Calcium: 9.3 mg/dL (ref 8.6–10.2)
Chloride: 93 mmol/L — ABNORMAL LOW (ref 96–106)
Creatinine, Ser: 0.86 mg/dL (ref 0.76–1.27)
Globulin, Total: 1.7 g/dL (ref 1.5–4.5)
Glucose: 140 mg/dL — ABNORMAL HIGH (ref 70–99)
Potassium: 5.3 mmol/L — ABNORMAL HIGH (ref 3.5–5.2)
Sodium: 131 mmol/L — ABNORMAL LOW (ref 134–144)
Total Protein: 6.4 g/dL (ref 6.0–8.5)
eGFR: 91 mL/min/{1.73_m2} (ref >59)

## 2021-10-08 LAB — LIPID PANEL
Chol/HDL Ratio: 3.3 ratio (ref 0.0–5.0)
Cholesterol, Total: 115 mg/dL (ref 100–199)
HDL: 35 mg/dL — ABNORMAL LOW (ref >39)
LDL Chol Calc (NIH): 62 mg/dL (ref 0–99)
Triglycerides: 91 mg/dL (ref 0–149)
VLDL Cholesterol Cal: 18 mg/dL (ref 5–40)

## 2021-10-08 LAB — HEMOGLOBIN A1C
Est. average glucose Bld gHb Est-mCnc: 160 mg/dL
Hgb A1c MFr Bld: 7.2 % — ABNORMAL HIGH (ref 4.8–5.6)

## 2021-10-09 ENCOUNTER — Encounter: Payer: Self-pay | Admitting: Cardiology

## 2021-10-09 NOTE — Assessment & Plan Note (Signed)
No further episodes of VT noted on ICD interrogation. No longer on amiodarone.  Maintained on stable dose carvedilol.  ICD in place with CRT-D.  Monitored by Dr. Ladona Ridgel.

## 2021-10-09 NOTE — Assessment & Plan Note (Signed)
Potassium 5.3.  Would like to continue current dose of Lasix because his sodium level is also a little low.  We may need to back off on ARB dose if potassium level maintained stable readings over 5.  In that case we would increase carvedilol dose.

## 2021-10-09 NOTE — Assessment & Plan Note (Signed)
Chemistry panel ordered today shows a sodium of 131.  Borderline low.  We will continue to follow.  I am reluctant of fully stopping his diuretic, however if we can get him on SGLT2 inhibitor, we may be able to potentially stop standing dose of furosemide and make it simply PRN.

## 2021-10-09 NOTE — Assessment & Plan Note (Addendum)
We talked about ischemic evaluation with Myoview.  Last visit, he indicated that he wanted to hold off unless he has symptoms.  Today he indicated that he has had some many studies and procedures done in the last year that he wants a break.  Agrees to discuss next visit.  Not sure how much help him I would be in the absence of symptoms because it would be read as abnormal with reduced EF.  Unfortunately we do not have a baseline Myoview since his CABG.

## 2021-10-09 NOTE — Assessment & Plan Note (Signed)
No further angina since his ICD placement for VT.  Cardiac catheterization 2017 showed stable coronaries with patent grafts.  Class I angina now on current dose of carvedilol and losartan.

## 2021-10-09 NOTE — Assessment & Plan Note (Signed)
Mild improvement of EF on echo.  On reasonable regimen, but not able to meet full guidelines because of availability of prescription coverage for Entresto and SGLT2 inhibitor.  Only able to tolerate low-dose ARB and on minimal dose furosemide. Unfortunately, with improvement of arrhythmia, EF did not improve that much.  This would suggest that the majority is ischemic.  Status post BiV ICD/CRT-D with minimal improvement in EF.

## 2021-10-09 NOTE — Assessment & Plan Note (Signed)
No evidence to suggest recurrence of A. fib on ICD interrogation.  With no recurrence of A. fib, we are maintaining him with aspirin and Plavix and have not switched to DOAC.

## 2021-10-09 NOTE — Assessment & Plan Note (Signed)
Blood pressure well controlled on current meds. Stable.  No change

## 2021-10-09 NOTE — Assessment & Plan Note (Signed)
Multivessel CAD status post CABG with no anginal symptoms in the absence of arrhythmia or heart failure.  Heart catheterization was in 2013 showed patent grafts.  He will be due for ischemic evaluation in the the near future.  He has had so many tests and procedures done this past year that he asked for reprieve of studies at this point.  Since his not having true symptoms, organ to hold off for another year or so and reassess at that time what he wants to do follow-up Myoview.  Plan -> Continue Aggressive Guideline Directed Medical Therapy  Remains on DAPT (with severe PAD, aortic disease and CAD, not unreasonable to stay on DAPT provided he has no bleeding issues)  On high-dose high intensity statin with well-controlled lipids  On combination of beta-blocker and ARB-blood pressure well controlled, no angina  Continue to talk about smoking cessation although he does not plan to stop smoking his pipe  Continue to stay active try and exercise much as possible.  Encouraged walking and gardening.  Healthy diet

## 2021-10-09 NOTE — Assessment & Plan Note (Signed)
Status post extensive surgical revision of the left SFA (endarterectomy) with profundoplasty and left SFA stent.  Now with healing wound and no more resting pain.  Claudication is also significantly improved.  Dopplers being followed by vascular surgery.

## 2021-10-09 NOTE — Assessment & Plan Note (Signed)
Chronic HFrEF-NYHA Class I-II CHF symptoms mostly dyspneic related to pulmonary disease.  No real PND, orthopnea.  Edema is well controlled with low-dose Lasix 10 mg that he is not even taking every day.  Sodium level is a little low today, but the potassium is still little high.  For now continue current dose of Lasix.  May need to titrate down his ARB dose and increase carvedilol.  Plan:  On carvedilol 6.25 mg twice daily.  If continues to have hyperkalemia and hyponatremia, would need to back down losartan in which case would increase carvedilol to probably 12.5 mg twice daily  Currently on valsartan 40 mg daily (in view of Entresto because of [facility] coverage)  Not on SGLT2 inhibitor (not covered by [place])  On very low-dose Lasix at 10 mg daily.  He can probably cut this down to 4 to 5 days a week if necessary.  Would then use additional dose PRN worsening edema or weight gain.  We discussed sliding scale.

## 2021-10-09 NOTE — Assessment & Plan Note (Addendum)
Labs not been checked since last year in August.  LDL was 62.  Well-controlled. Rechecked today.  Again stable LDL at 62 with total cholesterol 45.  LDL 35 which is better than last year 21.  Tolerating current dose of atorvastatin 80 mg.  No myalgias.  Continue current med, but low threshold to convert to rosuvastatin 40 mg if he does have symptoms.  He is on metformin and glipizide for diabetes.  Unable to get SGLT2 inhibitor covered by [place].   Continue to try to push for prescription for SGLT2 inhibitor.  Perhaps this would even help [place] get rid of the low-dose Lasix.

## 2021-10-09 NOTE — Assessment & Plan Note (Signed)
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  Not have multivessel disease.  His next ischemic event was in 2017 when he came in with acute heart failure again related to VT.  Severe ischemic cardiomyopathy.  Multivessel CABG.  EF has improved some but still very low.

## 2021-10-09 NOTE — Assessment & Plan Note (Signed)
Status post aortobifem bypass in 2014 followed by left SFA endarterectomy and profundoplasty with graft revision and left SFA stenting.  Notably improved claudication.  No resting pain.  Wound healing.  Carotid Dopplers performed did not show any significant findings.  Vascular Dopplers being followed by vascular surgery. Appreciate Dr. Carlis Abbott and Dr. Stanford Breed

## 2021-10-09 NOTE — Assessment & Plan Note (Signed)
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  Otherwise graft was patent on CT angiogram.  Continue aggressive cardiovascular risk factor modification as is appropriate for his CAD/PAD:  Aspirin and Plavix -> defer timing of combination therapy to vascular surgery  High-dose statin  Beta-blocker and ARB  Close glycemic control per PCP

## 2021-10-14 ENCOUNTER — Telehealth: Payer: Self-pay | Admitting: *Deleted

## 2021-10-14 ENCOUNTER — Other Ambulatory Visit: Payer: Self-pay

## 2021-10-14 DIAGNOSIS — E871 Hypo-osmolality and hyponatremia: Secondary | ICD-10-CM

## 2021-10-14 DIAGNOSIS — E875 Hyperkalemia: Secondary | ICD-10-CM

## 2021-10-14 DIAGNOSIS — I10 Essential (primary) hypertension: Secondary | ICD-10-CM

## 2021-10-14 DIAGNOSIS — I5022 Chronic systolic (congestive) heart failure: Secondary | ICD-10-CM

## 2021-10-14 NOTE — Telephone Encounter (Signed)
Patient reviewed via mychart.  Labslip mailed to patient to recheck - BMP  in one month. Wife aware.

## 2021-10-14 NOTE — Telephone Encounter (Signed)
-----   Message from Marykay Lex, MD sent at 10/09/2021  1:22 AM EST ----- Cholesterol panel looks great.  LDL looks the same but HDL (good cholesterol has improved significantly). Continue current dose of atorvastatin.  Chemistry panel shows borderline elevated blood sugars.  Sodium level is a little low at 131.  Potassium is little high at 5.3.  This is a little bit higher than the last time as far as potassium and stable sodium.  Liver function stable  At this point I think it is okay to continue doing the 1/2 tablet of Lasix daily.  We should recheck a chemistry panel (BMP in another month or so.  If the potassium is still high, we may reduce the valsartan dose).   Bryan Lemma, MD  Recheck BMP in 1 month

## 2021-10-16 ENCOUNTER — Ambulatory Visit (INDEPENDENT_AMBULATORY_CARE_PROVIDER_SITE_OTHER): Payer: Medicare Other

## 2021-10-16 DIAGNOSIS — I420 Dilated cardiomyopathy: Secondary | ICD-10-CM | POA: Diagnosis not present

## 2021-10-16 LAB — CUP PACEART REMOTE DEVICE CHECK
Battery Remaining Longevity: 28 mo
Battery Remaining Percentage: 32 %
Battery Voltage: 2.9 V
Brady Statistic AP VP Percent: 1 %
Brady Statistic AP VS Percent: 1 %
Brady Statistic AS VP Percent: 97 %
Brady Statistic AS VS Percent: 1.3 %
Brady Statistic RA Percent Paced: 1 %
Date Time Interrogation Session: 20230127032028
HighPow Impedance: 54 Ohm
HighPow Impedance: 54 Ohm
Implantable Lead Implant Date: 20171215
Implantable Lead Implant Date: 20171215
Implantable Lead Implant Date: 20171215
Implantable Lead Location: 753858
Implantable Lead Location: 753859
Implantable Lead Location: 753860
Implantable Lead Model: 7122
Implantable Pulse Generator Implant Date: 20171215
Lead Channel Impedance Value: 350 Ohm
Lead Channel Impedance Value: 450 Ohm
Lead Channel Impedance Value: 510 Ohm
Lead Channel Pacing Threshold Amplitude: 0.5 V
Lead Channel Pacing Threshold Amplitude: 0.625 V
Lead Channel Pacing Threshold Amplitude: 1 V
Lead Channel Pacing Threshold Pulse Width: 0.5 ms
Lead Channel Pacing Threshold Pulse Width: 0.5 ms
Lead Channel Pacing Threshold Pulse Width: 0.5 ms
Lead Channel Sensing Intrinsic Amplitude: 11.7 mV
Lead Channel Sensing Intrinsic Amplitude: 2.7 mV
Lead Channel Setting Pacing Amplitude: 2 V
Lead Channel Setting Pacing Amplitude: 2 V
Lead Channel Setting Pacing Amplitude: 2 V
Lead Channel Setting Pacing Pulse Width: 0.5 ms
Lead Channel Setting Pacing Pulse Width: 0.5 ms
Lead Channel Setting Sensing Sensitivity: 0.5 mV
Pulse Gen Serial Number: 7368976

## 2021-10-26 ENCOUNTER — Ambulatory Visit (INDEPENDENT_AMBULATORY_CARE_PROVIDER_SITE_OTHER): Payer: Medicare Other

## 2021-10-26 DIAGNOSIS — I5022 Chronic systolic (congestive) heart failure: Secondary | ICD-10-CM

## 2021-10-26 DIAGNOSIS — Z95810 Presence of automatic (implantable) cardiac defibrillator: Secondary | ICD-10-CM | POA: Diagnosis not present

## 2021-10-26 NOTE — Progress Notes (Signed)
Remote ICD transmission.   

## 2021-10-30 NOTE — Progress Notes (Signed)
EPIC Encounter for ICM Monitoring  Patient Name: [name] [name] is a 76 y.o. male Date: 10/30/2021 Primary Care Physican: John Giovanni, MD Primary Cardiologist: Ellyn Hack Electrophysiologist: Santina Evans Pacing: 97%     10/07/2021 Office Weight: 145 lbs     Spoke with wife and heart failure questions reviewed.  Pt asymptomatic for fluid accumulation.  Reports feeling well at this time and voices no complaints.     Corvue thoracic impedance normal but was suggesting possible fluid accumulation from 1/26-2/4.   Prescribed:  Furosemide 20 mg Take 0.5 tablets (10 mg total) by mouth daily. May take additional dose as needed for worsening swelling.  Per Dr Ellyn Hack 10/07/21 OV note he can probably cut Lasix to 4-5 days a week if necessary and can use additional dose PRN worsening edema or weight gain.  Sliding scale was discussed. Aspirin 81 mg daily   Labs: 10/07/2021 Creatinine 0.86, BUN 8, Potassium 5.3, Sodium 131, GFR 91 05/14/2021 Creatinine 0.76, BUN 6, Potassium 4.2, Sodium 132, GFR >60 05/13/2021 Creatinine 0.93, BUN NA, Potassium 4.0, Sodium 132, GFR >60  05/11/2021 Creatinine 0.82, BUN 7, Potassium 4.4, Sodium 139, GFR >60  A complete set of results can be found in Results Review.   Recommendations:  No changes and encouraged to call if experiencing any fluid symptoms.   Follow-up plan: ICM clinic phone appointment on 11/30/2021.   91 day device clinic remote transmission 01/15/2022.     EP/Cardiology Office Visits:   08/06/2022 with Dr Ellyn Hack.  Recall 02/05/2022 with Dr Lovena Le.   Copy of ICM check sent to Dr. Lovena Le.    3 month ICM trend: 10/26/2021.    12-14 Month ICM trend:     Rosalene Billings, RN 10/30/2021 8:04 AM

## 2021-11-11 DIAGNOSIS — I10 Essential (primary) hypertension: Secondary | ICD-10-CM | POA: Diagnosis not present

## 2021-11-11 DIAGNOSIS — I5022 Chronic systolic (congestive) heart failure: Secondary | ICD-10-CM | POA: Diagnosis not present

## 2021-11-11 DIAGNOSIS — E875 Hyperkalemia: Secondary | ICD-10-CM | POA: Diagnosis not present

## 2021-11-11 DIAGNOSIS — E871 Hypo-osmolality and hyponatremia: Secondary | ICD-10-CM | POA: Diagnosis not present

## 2021-11-12 LAB — BASIC METABOLIC PANEL
BUN/Creatinine Ratio: 11 (ref 10–24)
BUN: 10 mg/dL (ref 8–27)
CO2: 23 mmol/L (ref 20–29)
Calcium: 9.3 mg/dL (ref 8.6–10.2)
Chloride: 90 mmol/L — ABNORMAL LOW (ref 96–106)
Creatinine, Ser: 0.88 mg/dL (ref 0.76–1.27)
Glucose: 141 mg/dL — ABNORMAL HIGH (ref 70–99)
Potassium: 5.5 mmol/L — ABNORMAL HIGH (ref 3.5–5.2)
Sodium: 128 mmol/L — ABNORMAL LOW (ref 134–144)
eGFR: 91 mL/min/{1.73_m2} (ref >59)

## 2021-11-16 ENCOUNTER — Telehealth: Payer: Self-pay | Admitting: *Deleted

## 2021-11-16 DIAGNOSIS — I5022 Chronic systolic (congestive) heart failure: Secondary | ICD-10-CM

## 2021-11-16 DIAGNOSIS — E871 Hypo-osmolality and hyponatremia: Secondary | ICD-10-CM

## 2021-11-16 DIAGNOSIS — E875 Hyperkalemia: Secondary | ICD-10-CM

## 2021-11-16 NOTE — Telephone Encounter (Signed)
-----   Message from Marykay Lex, MD sent at 11/12/2021 10:52 PM EST ----- Chemistry panel shows stable kidney function.  However the sodium levels have gone down and potassium levels have gone up.  Recommendations: Hold furosemide for 2 days, hold valsartan for 2 days then take half dose for 2 days before restarting.  Recheck BMP end of next week.  Bryan Lemma, MD

## 2021-11-16 NOTE — Telephone Encounter (Signed)
Spoke to wife. Per wife , patient reviewed direction on mychart. Patient has been following  direction. Patient will come to the office @ 11/20/21 for( BMP)

## 2021-11-20 DIAGNOSIS — E871 Hypo-osmolality and hyponatremia: Secondary | ICD-10-CM | POA: Diagnosis not present

## 2021-11-20 DIAGNOSIS — E875 Hyperkalemia: Secondary | ICD-10-CM | POA: Diagnosis not present

## 2021-11-20 DIAGNOSIS — I5022 Chronic systolic (congestive) heart failure: Secondary | ICD-10-CM | POA: Diagnosis not present

## 2021-11-21 LAB — BASIC METABOLIC PANEL
BUN/Creatinine Ratio: 9 — ABNORMAL LOW (ref 10–24)
BUN: 8 mg/dL (ref 8–27)
CO2: 26 mmol/L (ref 20–29)
Calcium: 9.7 mg/dL (ref 8.6–10.2)
Chloride: 90 mmol/L — ABNORMAL LOW (ref 96–106)
Creatinine, Ser: 0.86 mg/dL (ref 0.76–1.27)
Glucose: 116 mg/dL — ABNORMAL HIGH (ref 70–99)
Potassium: 5.2 mmol/L (ref 3.5–5.2)
Sodium: 131 mmol/L — ABNORMAL LOW (ref 134–144)
eGFR: 91 mL/min/{1.73_m2} (ref >59)

## 2021-11-30 ENCOUNTER — Ambulatory Visit (INDEPENDENT_AMBULATORY_CARE_PROVIDER_SITE_OTHER): Payer: Medicare Other

## 2021-11-30 DIAGNOSIS — Z95810 Presence of automatic (implantable) cardiac defibrillator: Secondary | ICD-10-CM | POA: Diagnosis not present

## 2021-11-30 DIAGNOSIS — I5022 Chronic systolic (congestive) heart failure: Secondary | ICD-10-CM

## 2021-12-02 ENCOUNTER — Telehealth: Payer: Self-pay

## 2021-12-02 NOTE — Telephone Encounter (Signed)
Remote ICM transmission received.  Attempted call to patient regarding ICM remote transmission and no answer or voice mail option.  

## 2021-12-02 NOTE — Progress Notes (Signed)
EPIC Encounter for ICM Monitoring ? ?Patient Name: [name] [name] is a 76 y.o. male ?Date: 12/02/2021 ?Primary Care Physican: Dellia Cloud, MD ?Electrophysiologist: Ladona Ridgel ?Bi-V Pacing: 97%     ?10/07/2021 Office Weight: 145 lbs ?  ?  ?Spoke with wife and heart failure questions reviewed.  Pt asymptomatic for fluid accumulation.    ?  ?Corvue thoracic impedance normal. ?  ?Prescribed:  ?Furosemide 20 mg Take 0.5 tablets (10 mg total) by mouth daily. May take additional dose as needed for worsening swelling.  Per Dr Herbie Baltimore 10/07/21 OV note he can probably cut Lasix to 4-5 days a week if necessary and can use additional dose PRN worsening edema or weight gain.  Sliding scale was discussed. ?Aspirin 81 mg daily ?  ?Labs: ?10/07/2021 Creatinine 0.86, BUN 8, Potassium 5.3, Sodium 131, GFR 91 ?05/14/2021 Creatinine 0.76, BUN 6, Potassium 4.2, Sodium 132, GFR >60 ?05/13/2021 Creatinine 0.93, BUN NA, Potassium 4.0, Sodium 132, GFR >60  ?05/11/2021 Creatinine 0.82, BUN 7, Potassium 4.4, Sodium 139, GFR >60  ?A complete set of results can be found in Results Review. ?  ?Recommendations:  No changes and encouraged to call if experiencing any fluid symptoms. ?  ?Follow-up plan: ICM clinic phone appointment on 01/04/2022.   91 day device clinic remote transmission 01/15/2022.   ?  ?EP/Cardiology Office Visits:   Recall 08/06/2022 with Dr Herbie Baltimore.  Recall 02/05/2022 with Dr Ladona Ridgel. ?  ?Copy of ICM check sent to Dr. Ladona Ridgel.   ? ?3 month ICM trend: 11/30/2021. ? ? ? ?12-14 Month ICM trend:  ? ? ? ?Karie Soda, RN ?12/02/2021 ?1:40 PM ? ?

## 2021-12-22 ENCOUNTER — Telehealth: Payer: Self-pay

## 2021-12-22 NOTE — Patient Outreach (Signed)
[name] [name] was transferred over from the [employer] to [facility] Care Management.   ?She would like more information about our services to be sent to [name], I have verified their address.  ?A program packet will be sent. ? ?Iverson Alamin, CBCS, CMAA ?[facility] Care Management Assistant ?[facility] Care Management ?[phone]  ? ? ? ?   ?

## 2022-01-04 ENCOUNTER — Ambulatory Visit (INDEPENDENT_AMBULATORY_CARE_PROVIDER_SITE_OTHER): Payer: Medicare Other

## 2022-01-04 DIAGNOSIS — I5022 Chronic systolic (congestive) heart failure: Secondary | ICD-10-CM | POA: Diagnosis not present

## 2022-01-04 DIAGNOSIS — Z95810 Presence of automatic (implantable) cardiac defibrillator: Secondary | ICD-10-CM

## 2022-01-08 ENCOUNTER — Other Ambulatory Visit: Payer: Self-pay

## 2022-01-08 DIAGNOSIS — I70229 Atherosclerosis of native arteries of extremities with rest pain, unspecified extremity: Secondary | ICD-10-CM

## 2022-01-08 NOTE — Progress Notes (Signed)
EPIC Encounter for ICM Monitoring ? ?Patient Name: [name] [name] is a 76 y.o. male ?Date: 01/08/2022 ?Primary Care Physican: Dellia Cloud, MD ?Primary Cardiologist: Herbie Baltimore ?Electrophysiologist: Ladona Ridgel ?Bi-V Pacing: 97%     ?10/07/2021 Office Weight: 145 lbs ?  ?  ?Transmission reviewed.  ?  ?Corvue thoracic impedance normal. ?  ?Prescribed:  ?Furosemide 20 mg Take 0.5 tablets (10 mg total) by mouth daily. May take additional dose as needed for worsening swelling.  Per Dr Herbie Baltimore 10/07/21 OV note he can probably cut Lasix to 4-5 days a week if necessary and can use additional dose PRN worsening edema or weight gain.  Sliding scale was discussed. ?Aspirin 81 mg daily ?  ?Labs: ?10/07/2021 Creatinine 0.86, BUN 8, Potassium 5.3, Sodium 131, GFR 91 ?05/14/2021 Creatinine 0.76, BUN 6, Potassium 4.2, Sodium 132, GFR >60 ?05/13/2021 Creatinine 0.93, BUN NA, Potassium 4.0, Sodium 132, GFR >60  ?05/11/2021 Creatinine 0.82, BUN 7, Potassium 4.4, Sodium 139, GFR >60  ?A complete set of results can be found in Results Review. ?  ?Recommendations:  No changes. ?  ?Follow-up plan: ICM clinic phone appointment on 02/08/2022.   91 day device clinic remote transmission 04/16/2022.   ?  ?EP/Cardiology Office Visits:   Recall 08/06/2022 with Dr Herbie Baltimore.  Recall 02/05/2022 with Dr Ladona Ridgel. ?  ?Copy of ICM check sent to Dr. Ladona Ridgel.   ? ?3 month ICM trend: 01/04/2022. ? ? ? ?12-14 Month ICM trend:  ? ? ? ?Karie Soda, RN ?01/08/2022 ?3:19 PM ? ?

## 2022-01-12 ENCOUNTER — Encounter (HOSPITAL_COMMUNITY): Payer: Medicare Other

## 2022-01-12 ENCOUNTER — Ambulatory Visit: Payer: Medicare Other

## 2022-01-15 ENCOUNTER — Ambulatory Visit (INDEPENDENT_AMBULATORY_CARE_PROVIDER_SITE_OTHER): Payer: Medicare Other

## 2022-01-15 DIAGNOSIS — I420 Dilated cardiomyopathy: Secondary | ICD-10-CM

## 2022-01-15 LAB — CUP PACEART REMOTE DEVICE CHECK
Battery Remaining Longevity: 25 mo
Battery Remaining Percentage: 30 %
Battery Voltage: 2.89 V
Brady Statistic AP VP Percent: 1 %
Brady Statistic AP VS Percent: 1 %
Brady Statistic AS VP Percent: 96 %
Brady Statistic AS VS Percent: 1.4 %
Brady Statistic RA Percent Paced: 1 %
Date Time Interrogation Session: 20230428025716
HighPow Impedance: 57 Ohm
HighPow Impedance: 57 Ohm
Implantable Lead Implant Date: 20171215
Implantable Lead Implant Date: 20171215
Implantable Lead Implant Date: 20171215
Implantable Lead Location: 753858
Implantable Lead Location: 753859
Implantable Lead Location: 753860
Implantable Lead Model: 7122
Implantable Pulse Generator Implant Date: 20171215
Lead Channel Impedance Value: 340 Ohm
Lead Channel Impedance Value: 430 Ohm
Lead Channel Impedance Value: 450 Ohm
Lead Channel Pacing Threshold Amplitude: 0.5 V
Lead Channel Pacing Threshold Amplitude: 0.625 V
Lead Channel Pacing Threshold Amplitude: 1 V
Lead Channel Pacing Threshold Pulse Width: 0.5 ms
Lead Channel Pacing Threshold Pulse Width: 0.5 ms
Lead Channel Pacing Threshold Pulse Width: 0.5 ms
Lead Channel Sensing Intrinsic Amplitude: 1.7 mV
Lead Channel Sensing Intrinsic Amplitude: 12 mV
Lead Channel Setting Pacing Amplitude: 2 V
Lead Channel Setting Pacing Amplitude: 2 V
Lead Channel Setting Pacing Amplitude: 2 V
Lead Channel Setting Pacing Pulse Width: 0.5 ms
Lead Channel Setting Pacing Pulse Width: 0.5 ms
Lead Channel Setting Sensing Sensitivity: 0.5 mV
Pulse Gen Serial Number: 7368976

## 2022-01-19 ENCOUNTER — Ambulatory Visit (HOSPITAL_COMMUNITY)
Admission: RE | Admit: 2022-01-19 | Discharge: 2022-01-19 | Disposition: A | Discharge status: Home / Self Care | Payer: Medicare Other | Source: Ambulatory Visit | Attending: Vascular Surgery | Admitting: Vascular Surgery

## 2022-01-19 ENCOUNTER — Ambulatory Visit (INDEPENDENT_AMBULATORY_CARE_PROVIDER_SITE_OTHER)
Admission: RE | Admit: 2022-01-19 | Discharge: 2022-01-19 | Disposition: A | Discharge status: Home / Self Care | Payer: Medicare Other | Source: Ambulatory Visit | Attending: Vascular Surgery | Admitting: Vascular Surgery

## 2022-01-19 ENCOUNTER — Ambulatory Visit (INDEPENDENT_AMBULATORY_CARE_PROVIDER_SITE_OTHER): Payer: Medicare Other | Admitting: Physician Assistant

## 2022-01-19 VITALS — BP 132/76 | HR 60 | Temp 97.3°F | Resp 18 | Ht 65.0 in | Wt 139.9 lb

## 2022-01-19 DIAGNOSIS — I70221 Atherosclerosis of native arteries of extremities with rest pain, right leg: Secondary | ICD-10-CM | POA: Diagnosis not present

## 2022-01-19 DIAGNOSIS — I779 Disorder of arteries and arterioles, unspecified: Secondary | ICD-10-CM | POA: Diagnosis not present

## 2022-01-19 DIAGNOSIS — I70229 Atherosclerosis of native arteries of extremities with rest pain, unspecified extremity: Secondary | ICD-10-CM | POA: Diagnosis not present

## 2022-01-19 NOTE — Progress Notes (Signed)
[facility] [place] ?HISTORY AND PHYSICAL  ? ?History of Present Illness:  Patient is a 76 y.o. year old male who presents for evaluation of PAD.  He has a history of aortobifemoral bypass was done at the [facility] 2014.  More recently he has had ischemic ulcers on the left foot.  He underwent angiogram by Dr. Stanford Breed on 05/07/21 which showed: ?Occluded distal to the left limb of the aortobifemoral bypass.  There is retrograde flow into the left external iliac which fills the pelvis via collaterals. ?Profunda femoris artery: Occluded near its origin.  Reconstitutes early in its course ?Superficial femoral artery: Occluded near its origin.  Reconstitutes early in its course.  Heavily diseased throughout the thigh. Greatest stenosis >75%.  He had a patent popliteal artery, AT with diffuse disease and occluded PT.   ? He was then taken to the OR by Dr. Carlis Abbott for Left common femoral endarterectomy with profundoplasty and endarterectomy of the proximal SFA with bovine pericardial patch angioplasty and Left SFA angioplasty with stent placement.   ? He denies changes in his daily activities.  He mows on a riding mower and does raised garden gardening.  He tries to stay as active as he can.  He eats a healthy diet with supplemental protein shakes and high fiber.   ? He denies new non healing ulcers, improved distance with claudication and no rest ischemic pain.  He does have some pain and uses Lidocaine patches which helps with sleep. ? He is medically managed on ASA, Lipitor and Plavix.   ?  ? ?Past Medical History:  ?Diagnosis Date  ? Acid reflux   ? AICD (automatic cardioverter/defibrillator) present   ? a. 08/2016 St. Jude (serial Number [id number]) biventricular ICD.  ? Anemia   ? Chronic systolic CHF (congestive heart failure) ([place])   ? a. 08/2016 Echo: EF 20%, diffuse HK, inf, post AK, mod-sev MR, sev dil LA, mod TR, PASP 76mmHg.  ? Complication of anesthesia   ? "was told I responded well to anesthesia"  ?  COPD (chronic obstructive pulmonary disease) ([place])   ? Coronary artery disease involving native coronary artery of native heart with angina pectoris ([place])   ? a. 10/2012 MI after aortobifem bypass, found to have multivessel CAD -->CABG x3;  b. 08/2016 Cath: LM 60, LAD 62m, LCX 60ost/p, RCA 100p, VG->RPDA 50p, VG->OM1 ok, LIMA->LAD ok, EF 25%.  ? Essential hypertension 03/03/2012  ? History of blood transfusion 11/2012  ? "during his CABG"  ? History of gout X 1  ? History of stomach ulcers 1970s  ? Hyperlipidemia associated with type 2 diabetes mellitus ([place]) 09/16/2011  ? Ischemic cardiomyopathy   ? a. 08/2016 Echo: EF 20%, diffuse HK, inf, post AK;  b. 08/2016 St. Jude (serial Number [id number]) biventricular ICD.  ? Myocardial infarction [facility])   ? PAD (peripheral artery disease) ([place])   ? s/p aortobifemoral bypass 10/2012  ? Peripheral neuropathy   ? PTSD (post-traumatic stress disorder)   ? Type II diabetes mellitus ([place])   ? Ventricular tachycardia ([place])   ? a. 08/2016 St. Jude (serial Number [id number]) biventricular ICD-->oral amio added.  ? Wound infection after surgery, sequela 2014-2015  ? 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 Is now on chronic suppressive antibiotics with dicloxacillin  ? ? ?Past Surgical History:  ?Procedure Laterality Date  ? ABDOMINAL AORTOGRAM W/LOWER EXTREMITY Left 05/07/2021  ? Procedure: ABDOMINAL AORTOGRAM W/LOWER EXTREMITY;  Surgeon: Cherre Robins, MD;  Location: [place] [facility];  Service: Cardiovascular;  Laterality: Left;  ? AORTO-FEMORAL BYPASS GRAFT Bilateral 10/2012  ? [place]  ? CARDIAC CATHETERIZATION  04/03/2013  ? [place] (? for abnormal Nuclear ST) --> no PCI, by report, patent grafts.  ? CARDIAC CATHETERIZATION N/A 09/02/2016  ? Procedure: Left Heart Cath and Cors/Grafts Angiography;  Surgeon: Burnell Blanks, MD;  [facility]: [facility]:: Ost LM 60% -> mid LAD 99% subCTO,  ost-prox LCx 60%; prox RCA 100% CTO w/ (small) SVG->RPDA prox 50%; SVG->OM1 ok, LIMA->mLAD ok, EF [id number] - complicated by VT during access - Shock x 2, Lidocaine & Amiodarone  ? CARDIAC CATHETERIZATION  11/09/2012  ? [place]: due to Sustained VT post-Ob Ao-BiFem Bypass. (no Angina)  ? CHOLECYSTECTOMY  03/05/2012  ? Procedure: LAPAROSCOPIC CHOLECYSTECTOMY WITH INTRAOPERATIVE CHOLANGIOGRAM;  Surgeon: Rolm Bookbinder, MD;  Location: [place];  Service: General;  Laterality: N/A;  ? CORONARY ARTERY BYPASS GRAFT  11/2012  ? [place]: CABG X 3 -> LIMA-mLAD, SVG-highOM1, SVG-RPDA (dRCA).  ? ENDARTERECTOMY FEMORAL Left 05/13/2021  ? Procedure: Left Femoral Endarterectomy WITH BOVINE PATCH PROFUNDOPLASTY, REVISION OF LEFT LIMB OF AORTO-BIFEMORAL GRAFT WITH DACRON GRAFT;  Surgeon: Marty Heck, MD;  Location: [place];  Service: Vascular;  Laterality: Left;  ? EP IMPLANTABLE DEVICE N/A 09/03/2016  ? Procedure: BI-VENTRICULAR IMPLANTABLE CARDIOVERTER DEFIBRILLATOR  (CRT-D);  Surgeon: Evans Lance, MD;  Location: [place] [facility];  Service: Cardiovascular;  Laterality: N/A;  ? INGUINAL HERNIA REPAIR Left 1990s  ? INSERTION OF ILIAC STENT Left 05/13/2021  ? Procedure: INSERTION OF SUPERFICIAL FEMORAL ARTERY STENT TIMES THREE AND ANGIOPLASTY;  Surgeon: Marty Heck, MD;  Location: [place];  Service: Vascular;  Laterality: Left;  ? INTRAOPERATIVE ARTERIOGRAM Left 05/13/2021  ? Procedure: INTRA OPERATIVE ARTERIOGRAM OF LEFT LOWER EXTREMITY;  Surgeon: Marty Heck, MD;  Location: [place];  Service: Vascular;  Laterality: Left;  ? STERNOTOMY  09/2013  ? [facility]): Sternal Wound Infection -> re-do sternotomy with metal place "sheild" placed. -Has chronic staph infection, on chronic suppressive medication  ? TRANSTHORACIC ECHOCARDIOGRAM  02/28/2017  ? EF remains 20p-25% severely reduced function. Diffuse hypokinesis. "Grade 1 diastolic dysfunction ". Severely calcified aortic valve with restricted mobility  (functioning bicuspid) however no suggestion of stenosis.  ? TRANSTHORACIC ECHOCARDIOGRAM  01/2021  ? EF 25 to 30%.  Severely depressed EF.  Global HK with worst basal to mid anterolateral and inferolateral as well as anterior basal inferior walls.  GR 1 DD.  Mildly reduced RV function.  Functionally bicuspid aortic valve with moderate calcification but no stenosis.  Overall slightly improved EF.  ? UMBILICAL HERNIA REPAIR  03/05/2012  ? Procedure: HERNIA REPAIR UMBILICAL ADULT;  Surgeon: Rolm Bookbinder, MD;  Location: [place];  Service: General;  Laterality: N/A;  ? ? ?ROS:  ? ?General:  No weight loss, Fever, chills ? ?HEENT: No recent headaches, no nasal bleeding, no visual changes, no sore throat ? ?Neurologic: No dizziness, blackouts, seizures. No recent symptoms of stroke or mini- stroke. No recent episodes of slurred speech, or temporary blindness. ? ?Cardiac: No recent episodes of chest pain/pressure, no shortness of breath at rest.  No shortness of breath with exertion.  Denies history of atrial fibrillation or irregular heartbeat ? ?Vascular: No history of rest pain in feet.  No history of claudication.  No history of non-healing ulcer, No history of DVT  ? ?Pulmonary: No home oxygen, no productive  cough, no hemoptysis,  No asthma or wheezing ? ?Musculoskeletal:  [ ]  Arthritis, [ ]  Low back pain,  [ ]  Joint pain ? ?Hematologic:No history of hypercoagulable state.  No history of easy bleeding.  No history of anemia ? ?Gastrointestinal: No hematochezia or melena,  No gastroesophageal reflux, no trouble swallowing ? ?Urinary: [ ]  chronic Kidney disease, [ ]  on HD - [ ]  MWF or [ ]  TTHS, [ ]  Burning with urination, [ ]  Frequent urination, [ ]  Difficulty urinating;  ? ?Skin: No rashes ? ?Psychological: No history of anxiety,  No history of depression ? ?Social History ?Social History  ? ?Tobacco Use  ? Smoking status: Every Day  ?  Types: Pipe  ? Smokeless tobacco: Former  ?  Types: Snuff, Chew  ? Tobacco  comments:  ?  09/03/2016 "used smokeless tobacco in my teens; quit smoking cigarettes in the early 1980s; smokes pipe only"  ?Vaping Use  ? Vaping Use: Never used  ?Substance Use Topics  ? Alcohol use: No  ? Drug u

## 2022-01-25 ENCOUNTER — Other Ambulatory Visit: Payer: Self-pay | Admitting: *Deleted

## 2022-01-25 DIAGNOSIS — I779 Disorder of arteries and arterioles, unspecified: Secondary | ICD-10-CM

## 2022-01-25 DIAGNOSIS — I70229 Atherosclerosis of native arteries of extremities with rest pain, unspecified extremity: Secondary | ICD-10-CM

## 2022-01-29 NOTE — Progress Notes (Signed)
Remote ICD transmission.   

## 2022-02-08 ENCOUNTER — Ambulatory Visit (INDEPENDENT_AMBULATORY_CARE_PROVIDER_SITE_OTHER): Payer: Medicare Other

## 2022-02-08 DIAGNOSIS — I5022 Chronic systolic (congestive) heart failure: Secondary | ICD-10-CM

## 2022-02-08 DIAGNOSIS — Z95810 Presence of automatic (implantable) cardiac defibrillator: Secondary | ICD-10-CM | POA: Diagnosis not present

## 2022-02-08 NOTE — Progress Notes (Unsigned)
EPIC Encounter for ICM Monitoring  Patient Name: [name] [name] is a 76 y.o. male Date: 02/08/2022 Primary Care Physican: John Giovanni, MD Primary Cardiologist: Ellyn Hack Electrophysiologist: Santina Evans Pacing: 96%     10/07/2021 Office Weight: 145 lbs     Spoke with patient and heart failure questions reviewed.  Pt asymptomatic for fluid accumulation.  He takes Lasix 10 mg daily.    Corvue thoracic impedance suggesting possible fluid accumulation starting 5/13.   Prescribed:  Furosemide 20 mg Take 0.5 tablets (10 mg total) by mouth daily. May take additional dose as needed for worsening swelling.   Aspirin 81 mg daily   Labs: 11/20/2021 Creatinine 0.86, BUN 8,   Potassium 5.2, Sodium 131, GFR 91 11/11/2021 Creatinine 0.88, BUN 10, Potassium 5.5, Sodium 128, GFR 91 10/07/2021 Creatinine 0.86, BUN 8,   Potassium 5.3, Sodium 131, GFR 91 A complete set of results can be found in Results Review.   Recommendations:  Advised to take Lasix 20 mg daily x 3 days and limit salt and fluid intake.     Follow-up plan: ICM clinic phone appointment on 02/16/2022 to recheck fluid levels.   91 day device clinic remote transmission 04/16/2022.     EP/Cardiology Office Visits:   Recall 08/06/2022 with Dr Ellyn Hack.  Recall 02/05/2022 with Dr Lovena Le.   Copy of ICM check sent to Dr. Lovena Le and Dr Ellyn Hack as Juluis Rainier.    3 month ICM trend: 02/08/2022.    12-14 Month ICM trend:     Rosalene Billings, RN 02/08/2022 10:04 AM

## 2022-02-16 ENCOUNTER — Ambulatory Visit (INDEPENDENT_AMBULATORY_CARE_PROVIDER_SITE_OTHER): Payer: Medicare Other

## 2022-02-16 DIAGNOSIS — Z95810 Presence of automatic (implantable) cardiac defibrillator: Secondary | ICD-10-CM

## 2022-02-16 DIAGNOSIS — I5022 Chronic systolic (congestive) heart failure: Secondary | ICD-10-CM

## 2022-02-19 NOTE — Progress Notes (Signed)
EPIC Encounter for ICM Monitoring  Patient Name: [name] [name] is a 76 y.o. male Date: 02/19/2022 Primary Care Physican: Dellia Cloud, MD Primary Cardiologist: Herbie Baltimore Electrophysiologist: Rae Roam Pacing: 96%     10/07/2021 Office Weight: 145 lbs 02/19/2022 Weight: 138 lbs     Spoke with patient and heart failure questions reviewed.  Pt asymptomatic for fluid accumulation.  He takes Lasix 10 mg daily.    Corvue thoracic impedance suggesting suggesting dryness in response to taking Furosemide 20 mg x 3 days and hs returned to 10 mg daily.   Prescribed:  Furosemide 20 mg Take 0.5 tablets (10 mg total) by mouth daily. May take additional dose as needed for worsening swelling.   Aspirin 81 mg daily   Labs: 11/20/2021 Creatinine 0.86, BUN 8,   Potassium 5.2, Sodium 131, GFR 91 11/11/2021 Creatinine 0.88, BUN 10, Potassium 5.5, Sodium 128, GFR 91 10/07/2021 Creatinine 0.86, BUN 8,   Potassium 5.3, Sodium 131, GFR 91 A complete set of results can be found in Results Review.   Recommendations:  Advised to drink 64 oz daily to stay hydrated.     Follow-up plan: ICM clinic phone appointment on 03/15/2022.   91 day device clinic remote transmission 04/16/2022.     EP/Cardiology Office Visits:   Recall 08/06/2022 with Dr Herbie Baltimore.  Recall 02/05/2022 with Dr Ladona Ridgel.   Copy of ICM check sent to Dr. Ladona Ridgel.  3 month ICM trend: 02/16/2022.    12-14 Month ICM trend:     Karie Soda, RN 02/19/2022 1:30 PM

## 2022-03-15 ENCOUNTER — Ambulatory Visit (INDEPENDENT_AMBULATORY_CARE_PROVIDER_SITE_OTHER): Payer: Medicare Other

## 2022-03-15 DIAGNOSIS — Z95810 Presence of automatic (implantable) cardiac defibrillator: Secondary | ICD-10-CM

## 2022-03-15 DIAGNOSIS — I5022 Chronic systolic (congestive) heart failure: Secondary | ICD-10-CM

## 2022-03-17 NOTE — Progress Notes (Signed)
EPIC Encounter for ICM Monitoring  Patient Name: [name] [name] is a 76 y.o. male Date: 03/17/2022 Primary Care Physican: Dellia Cloud, MD Primary Cardiologist: Herbie Baltimore Electrophysiologist: Rae Roam Pacing: 96%     03/17/2022 Weight: 138 lbs     Spoke with patient and heart failure questions reviewed.  Pt asymptomatic for fluid accumulation.     Corvue thoracic impedance suggesting normal fluid levels.   Prescribed:  Furosemide 20 mg Take 0.5 tablets (10 mg total) by mouth daily. May take additional dose as needed for worsening swelling.   Aspirin 81 mg daily   Labs: 11/20/2021 Creatinine 0.86, BUN 8,   Potassium 5.2, Sodium 131, GFR 91 11/11/2021 Creatinine 0.88, BUN 10, Potassium 5.5, Sodium 128, GFR 91 10/07/2021 Creatinine 0.86, BUN 8,   Potassium 5.3, Sodium 131, GFR 91 A complete set of results can be found in Results Review.   Recommendations:  No changes and encouraged to call if experiencing any fluid symptoms.   Follow-up plan: ICM clinic phone appointment on 04/19/2022.   91 day device clinic remote transmission 04/16/2022.     EP/Cardiology Office Visits:   Recall 08/06/2022 with Dr Herbie Baltimore.  Recall 02/05/2022 with Dr Ladona Ridgel.   Copy of ICM check sent to Dr. Ladona Ridgel.  3 month ICM trend: 03/15/2022.    12-14 Month ICM trend:     Karie Soda, RN 03/17/2022 2:50 PM

## 2022-04-16 ENCOUNTER — Ambulatory Visit (INDEPENDENT_AMBULATORY_CARE_PROVIDER_SITE_OTHER): Payer: Medicare Other

## 2022-04-16 DIAGNOSIS — I420 Dilated cardiomyopathy: Secondary | ICD-10-CM

## 2022-04-16 LAB — CUP PACEART REMOTE DEVICE CHECK
Battery Remaining Longevity: 25 mo
Battery Remaining Percentage: 29 %
Battery Voltage: 2.87 V
Brady Statistic AP VP Percent: 1.1 %
Brady Statistic AP VS Percent: 1 %
Brady Statistic AS VP Percent: 94 %
Brady Statistic AS VS Percent: 1.4 %
Brady Statistic RA Percent Paced: 1 %
Date Time Interrogation Session: 20230728020017
HighPow Impedance: 50 Ohm
HighPow Impedance: 50 Ohm
Implantable Lead Implant Date: 20171215
Implantable Lead Implant Date: 20171215
Implantable Lead Implant Date: 20171215
Implantable Lead Location: 753858
Implantable Lead Location: 753859
Implantable Lead Location: 753860
Implantable Lead Model: 7122
Implantable Pulse Generator Implant Date: 20171215
Lead Channel Impedance Value: 330 Ohm
Lead Channel Impedance Value: 430 Ohm
Lead Channel Impedance Value: 430 Ohm
Lead Channel Pacing Threshold Amplitude: 0.5 V
Lead Channel Pacing Threshold Amplitude: 0.625 V
Lead Channel Pacing Threshold Amplitude: 1.125 V
Lead Channel Pacing Threshold Pulse Width: 0.5 ms
Lead Channel Pacing Threshold Pulse Width: 0.5 ms
Lead Channel Pacing Threshold Pulse Width: 0.5 ms
Lead Channel Sensing Intrinsic Amplitude: 1.5 mV
Lead Channel Sensing Intrinsic Amplitude: 11.7 mV
Lead Channel Setting Pacing Amplitude: 2 V
Lead Channel Setting Pacing Amplitude: 2 V
Lead Channel Setting Pacing Amplitude: 2.125
Lead Channel Setting Pacing Pulse Width: 0.5 ms
Lead Channel Setting Pacing Pulse Width: 0.5 ms
Lead Channel Setting Sensing Sensitivity: 0.5 mV
Pulse Gen Serial Number: 7368976

## 2022-04-19 ENCOUNTER — Ambulatory Visit (INDEPENDENT_AMBULATORY_CARE_PROVIDER_SITE_OTHER): Payer: Medicare Other

## 2022-04-19 DIAGNOSIS — Z95810 Presence of automatic (implantable) cardiac defibrillator: Secondary | ICD-10-CM | POA: Diagnosis not present

## 2022-04-19 DIAGNOSIS — I5022 Chronic systolic (congestive) heart failure: Secondary | ICD-10-CM | POA: Diagnosis not present

## 2022-04-21 ENCOUNTER — Telehealth: Payer: Self-pay

## 2022-04-21 NOTE — Progress Notes (Signed)
EPIC Encounter for ICM Monitoring  Patient Name: [name] [name] is a 76 y.o. male Date: 04/21/2022 Primary Care Physican: Dellia Cloud, MD Primary Cardiologist: Herbie Baltimore Electrophysiologist: Rae Roam Pacing: 95%     03/17/2022 Weight: 138 lbs     Attempted call to patient and unable to reach. Transmission reviewed.    Corvue thoracic impedance suggesting possible fluid accumulation starting 7/23.   Prescribed:  Furosemide 20 mg Take 0.5 tablets (10 mg total) by mouth daily. May take additional dose as needed for worsening swelling.   Aspirin 81 mg daily   Labs: 11/20/2021 Creatinine 0.86, BUN 8,   Potassium 5.2, Sodium 131, GFR 91 11/11/2021 Creatinine 0.88, BUN 10, Potassium 5.5, Sodium 128, GFR 91 10/07/2021 Creatinine 0.86, BUN 8,   Potassium 5.3, Sodium 131, GFR 91 A complete set of results can be found in Results Review.   Recommendations:  Unable to reach.     Follow-up plan: ICM clinic phone appointment on 04/26/2022 to recheck fluid levels.   91 day device clinic remote transmission 07/16/2022.     EP/Cardiology Office Visits:    04/30/2022 with Francis Dowse, PA.   Recall 08/06/2022 with Dr Herbie Baltimore.  Recall 02/05/2022 with Dr Ladona Ridgel.   Copy of ICM check sent to Dr. Ladona Ridgel.  Will send to Dr Herbie Baltimore for review if patient is reached.  3 month ICM trend: 04/19/2022.    12-14 Month ICM trend:     Karie Soda, RN 04/21/2022 12:06 PM

## 2022-04-21 NOTE — Telephone Encounter (Signed)
Remote ICM transmission received.  Attempted call to patient regarding ICM remote transmission and no answer or voice mail message.   

## 2022-04-26 ENCOUNTER — Ambulatory Visit (INDEPENDENT_AMBULATORY_CARE_PROVIDER_SITE_OTHER): Payer: Medicare Other

## 2022-04-26 DIAGNOSIS — Z95810 Presence of automatic (implantable) cardiac defibrillator: Secondary | ICD-10-CM

## 2022-04-26 DIAGNOSIS — I5022 Chronic systolic (congestive) heart failure: Secondary | ICD-10-CM

## 2022-04-27 NOTE — Progress Notes (Signed)
EPIC Encounter for ICM Monitoring  Patient Name: [name] [name] is a 76 y.o. male Date: 04/27/2022 Primary Care Physican: Dellia Cloud, MD Primary Cardiologist: Herbie Baltimore Electrophysiologist: Rae Roam Pacing: 95%     03/17/2022 Weight: 138 lbs     Transmission reviewed.    Corvue thoracic impedance suggesting fluid levels returned to normal.   Prescribed:  Furosemide 20 mg Take 0.5 tablets (10 mg total) by mouth daily. May take additional dose as needed for worsening swelling.   Aspirin 81 mg daily   Labs: 11/20/2021 Creatinine 0.86, BUN 8,   Potassium 5.2, Sodium 131, GFR 91 11/11/2021 Creatinine 0.88, BUN 10, Potassium 5.5, Sodium 128, GFR 91 10/07/2021 Creatinine 0.86, BUN 8,   Potassium 5.3, Sodium 131, GFR 91 A complete set of results can be found in Results Review.   Recommendations:  No changes.   Follow-up plan: ICM clinic phone appointment on 05/25/2022.   91 day device clinic remote transmission 07/16/2022.     EP/Cardiology Office Visits:    04/30/2022 with Francis Dowse, PA.   Recall 08/06/2022 with Dr Herbie Baltimore.  Recall 02/05/2022 with Dr Ladona Ridgel.   Copy of ICM check sent to Dr. Ladona Ridgel.   3 month ICM trend: 04/24/2022.    Karie Soda, RN 04/27/2022 4:50 PM

## 2022-04-29 ENCOUNTER — Encounter: Payer: Medicare Other | Admitting: Physician Assistant

## 2022-04-30 ENCOUNTER — Ambulatory Visit (INDEPENDENT_AMBULATORY_CARE_PROVIDER_SITE_OTHER): Payer: Medicare Other | Admitting: Physician Assistant

## 2022-04-30 ENCOUNTER — Encounter: Payer: Self-pay | Admitting: Physician Assistant

## 2022-04-30 VITALS — BP 128/58 | HR 60 | Ht 66.0 in | Wt 141.0 lb

## 2022-04-30 DIAGNOSIS — Z95810 Presence of automatic (implantable) cardiac defibrillator: Secondary | ICD-10-CM | POA: Diagnosis not present

## 2022-04-30 DIAGNOSIS — Z79899 Other long term (current) drug therapy: Secondary | ICD-10-CM

## 2022-04-30 DIAGNOSIS — I480 Paroxysmal atrial fibrillation: Secondary | ICD-10-CM

## 2022-04-30 DIAGNOSIS — I2510 Atherosclerotic heart disease of native coronary artery without angina pectoris: Secondary | ICD-10-CM | POA: Diagnosis not present

## 2022-04-30 DIAGNOSIS — I472 Ventricular tachycardia, unspecified: Secondary | ICD-10-CM

## 2022-04-30 DIAGNOSIS — I779 Disorder of arteries and arterioles, unspecified: Secondary | ICD-10-CM | POA: Diagnosis not present

## 2022-04-30 LAB — CUP PACEART INCLINIC DEVICE CHECK
Battery Remaining Longevity: 25 mo
Brady Statistic RA Percent Paced: 0.12 %
Brady Statistic RV Percent Paced: 95 %
Date Time Interrogation Session: 20230811165245
HighPow Impedance: 57.375
Implantable Lead Implant Date: 20171215
Implantable Lead Implant Date: 20171215
Implantable Lead Implant Date: 20171215
Implantable Lead Location: 753858
Implantable Lead Location: 753859
Implantable Lead Location: 753860
Implantable Lead Model: 7122
Implantable Pulse Generator Implant Date: 20171215
Lead Channel Impedance Value: 337.5 Ohm
Lead Channel Impedance Value: 475 Ohm
Lead Channel Impedance Value: 537.5 Ohm
Lead Channel Pacing Threshold Amplitude: 0.5 V
Lead Channel Pacing Threshold Amplitude: 0.5 V
Lead Channel Pacing Threshold Amplitude: 0.625 V
Lead Channel Pacing Threshold Amplitude: 1 V
Lead Channel Pacing Threshold Pulse Width: 0.5 ms
Lead Channel Pacing Threshold Pulse Width: 0.5 ms
Lead Channel Pacing Threshold Pulse Width: 0.5 ms
Lead Channel Pacing Threshold Pulse Width: 0.5 ms
Lead Channel Sensing Intrinsic Amplitude: 11.7 mV
Lead Channel Sensing Intrinsic Amplitude: 3.3 mV
Lead Channel Setting Pacing Amplitude: 2 V
Lead Channel Setting Pacing Amplitude: 2 V
Lead Channel Setting Pacing Amplitude: 2 V
Lead Channel Setting Pacing Pulse Width: 0.5 ms
Lead Channel Setting Pacing Pulse Width: 0.5 ms
Lead Channel Setting Sensing Sensitivity: 0.5 mV
Pulse Gen Serial Number: 7368976

## 2022-04-30 MED ORDER — CARVEDILOL 12.5 MG PO TABS: 12.5000 mg | ORAL_TABLET | Freq: Two times a day (BID) | ORAL | 3 refills | Status: DC

## 2022-04-30 MED ORDER — VALSARTAN 40 MG PO TABS: 20.0000 mg | ORAL_TABLET | Freq: Every morning | ORAL | 3 refills | Status: DC

## 2022-04-30 NOTE — Progress Notes (Addendum)
Cardiology Office Note Date:  04/30/2022  Patient ID:  [name], [name] 1946/01/03, MRN [id number] PCP:  Dellia Cloud, MD  Cardiologist:  Dr. Herbie Baltimore PAD: Dr. Allyson Sabal Electrophysiologist: Dr. Ladona Ridgel    Chief Complaint:  annual visit  History of Present Illness: [name] [name] is a 76 y.o. male with history of CAD (CABG 2014), PVD (Aorto-bifem bypass 2014, endarterectomy with profundoplasty of his left common femoral artery and proximal left SFA by Dr. Chestine Spore on 05/13/2021 along with left SFA stenting), HTN, HLD, DM, AFib, ICM, ICD, chronic CHF (systolic), COPD, VT  He comes in today to be seen for dr. Ladona Ridgel, last seen by him May 2022, doing well off amiodarone without symptomatic Afib.  Still gardening but planting less, gets tired.  Described class II symptoms. No changes were made.  He saw D. Herbie Baltimore Jan 2023, cardiac wise seemed to be doing OK, trying to stay active, some orthopedic limitations it seemed.planned for labs  TODAY His legs get tired with increased ambulation, this is his limiting factor, no pain. No CP, palpitations or cardiac awareness No SOB, DOE No near syncope or syncope.  PMD is at the [place], they do his labs   Device information Abbott CRT-D implanted 09/03/2016  AAD hx Amiodarone stopped in 2020 with SOB and abnormal PFTs/ILD   Past Medical History:  Diagnosis Date   Acid reflux    AICD (automatic cardioverter/defibrillator) present    a. 08/2016 St. Jude (serial Number [id number]) biventricular ICD.   Anemia    Chronic systolic CHF (congestive heart failure) (HCC)    a. 08/2016 Echo: EF 20%, diffuse HK, inf, post AK, mod-sev MR, sev dil LA, mod TR, PASP .   Complication of anesthesia    "was told I responded well to anesthesia"   COPD (chronic obstructive pulmonary disease) (HCC)    Coronary artery disease involving native coronary artery of native heart with angina pectoris (HCC)    a. 10/2012 MI after aortobifem bypass, found to have  multivessel CAD -->CABG x3;  b. 08/2016 Cath: LM 60, LAD 66m, LCX 60ost/p, RCA 100p, VG->RPDA 50p, VG->OM1 ok, LIMA->LAD ok, EF 25%.   Essential hypertension 03/03/2012   History of blood transfusion 11/2012   "during his CABG"   History of gout X 1   History of stomach ulcers 1970s   Hyperlipidemia associated with type 2 diabetes mellitus (HCC) 09/16/2011   Ischemic cardiomyopathy    a. 08/2016 Echo: EF 20%, diffuse HK, inf, post AK;  b. 08/2016 St. Jude (serial Number [id number]) biventricular ICD.   Myocardial infarction [facility])    PAD (peripheral artery disease) (HCC)    s/p aortobifemoral bypass 10/2012   Peripheral neuropathy    PTSD (post-traumatic stress disorder)    Type II diabetes mellitus (HCC)    Ventricular tachycardia (HCC)    a. 08/2016 St. Jude (serial Number [id number]) biventricular ICD-->oral amio added.   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 Is now on chronic suppressive antibiotics with dicloxacillin    Past Surgical History:  Procedure Laterality Date   ABDOMINAL AORTOGRAM W/LOWER EXTREMITY Left 05/07/2021   Procedure: ABDOMINAL AORTOGRAM W/LOWER EXTREMITY;  Surgeon: [name], MD;  [facility] INVASIVE CV LAB;  Service: Cardiovascular;  Laterality: Left;   AORTO-FEMORAL BYPASS GRAFT Bilateral 10/2012   [facility]   CARDIAC CATHETERIZATION  04/03/2013   [facility] [place] (? for abnormal Nuclear ST) -->  no PCI, by report, patent grafts.   CARDIAC CATHETERIZATION N/A 09/02/2016   Procedure: Left Heart Cath and Cors/Grafts Angiography;  Surgeon: Kathleene Hazel, MD;  Location: [facility] INVASIVE CV LAB:: Ost LM 60% -> mid LAD 99% subCTO, ost-prox LCx 60%; prox RCA 100% CTO w/ (small) SVG->RPDA prox 50%; SVG->OM1 ok, LIMA->mLAD ok, EF 25% - complicated by VT during access - Shock x 2, Lidocaine & Amiodarone   CARDIAC CATHETERIZATION  11/09/2012    [facility]: due to Sustained VT post-Ob Ao-BiFem Bypass. (no Angina)   CHOLECYSTECTOMY  03/05/2012   Procedure: LAPAROSCOPIC CHOLECYSTECTOMY WITH INTRAOPERATIVE CHOLANGIOGRAM;  Surgeon: Emelia Loron, MD;  Location: [facility] OR;  Service: General;  Laterality: N/A;   CORONARY ARTERY BYPASS GRAFT  11/2012   [facility]: CABG X 3 -> LIMA-mLAD, SVG-highOM1, SVG-RPDA (dRCA).   ENDARTERECTOMY FEMORAL Left 05/13/2021   Procedure: Left Femoral Endarterectomy WITH BOVINE PATCH PROFUNDOPLASTY, REVISION OF LEFT LIMB OF AORTO-BIFEMORAL GRAFT WITH DACRON GRAFT;  Surgeon: Cephus Shelling, MD;  Location: [facility] OR;  Service: Vascular;  Laterality: Left;   EP IMPLANTABLE DEVICE N/A 09/03/2016   Procedure: BI-VENTRICULAR IMPLANTABLE CARDIOVERTER DEFIBRILLATOR  (CRT-D);  Surgeon: Marinus Maw, MD;  Location: [facility] INVASIVE CV LAB;  Service: Cardiovascular;  Laterality: N/A;   INGUINAL HERNIA REPAIR Left 1990s   INSERTION OF ILIAC STENT Left 05/13/2021   Procedure: INSERTION OF SUPERFICIAL FEMORAL ARTERY STENT TIMES THREE AND ANGIOPLASTY;  Surgeon: Cephus Shelling, MD;  Location: [facility] OR;  Service: Vascular;  Laterality: Left;   INTRAOPERATIVE ARTERIOGRAM Left 05/13/2021   Procedure: INTRA OPERATIVE ARTERIOGRAM OF LEFT LOWER EXTREMITY;  Surgeon: Cephus Shelling, MD;  Location: [facility] OR;  Service: Vascular;  Laterality: Left;   STERNOTOMY  09/2013   [facility]): Sternal Wound Infection -> re-do sternotomy with metal place "sheild" placed. -Has chronic staph infection, on chronic suppressive medication   TRANSTHORACIC ECHOCARDIOGRAM  02/28/2017   EF remains 20p-25% severely reduced function. Diffuse hypokinesis. "Grade 1 diastolic dysfunction ". Severely calcified aortic valve with restricted mobility (functioning bicuspid) however no suggestion of stenosis.   TRANSTHORACIC ECHOCARDIOGRAM  01/2021   EF 25 to 30%.  Severely depressed EF.  Global HK with worst basal to mid anterolateral and inferolateral as well as  anterior basal inferior walls.  GR 1 DD.  Mildly reduced RV function.  Functionally bicuspid aortic valve with moderate calcification but no stenosis.  Overall slightly improved EF.   UMBILICAL HERNIA REPAIR  03/05/2012   Procedure: HERNIA REPAIR UMBILICAL ADULT;  Surgeon: Emelia Loron, MD;  Location: [facility] OR;  Service: General;  Laterality: N/A;    Current Outpatient Medications  Medication Sig Dispense Refill   albuterol (VENTOLIN HFA) 108 (90 Base) MCG/ACT inhaler Inhale 1-2 puffs into the lungs every 6 (six) hours as needed for wheezing or shortness of breath.     Alpha-Lipoic Acid 600 MG TABS Take 600 mg by mouth daily.     aspirin EC 81 MG tablet Take 81 mg by mouth daily.     atorvastatin (LIPITOR) 80 MG tablet Take 40 mg by mouth every evening.     cholecalciferol (VITAMIN D) 25 MCG (1000 UNIT) tablet Take 2,000 Units by mouth every evening.     clopidogrel (PLAVIX) 75 MG tablet Take 1 tablet (75 mg total) by mouth daily at 6 (six) AM. 30 tablet 2   dicloxacillin (DYNAPEN) 250 MG capsule Take 500 mg by mouth 3 (three) times daily.     fluticasone (FLONASE) 50 MCG/ACT nasal spray Place  2 sprays into both nostrils daily as needed for allergies or rhinitis.     furosemide (LASIX) 20 MG tablet Take 0.5 tablets (10 mg total) by mouth daily. May take additional dose as needed for worsening swelling 60 tablet 3   glipiZIDE (GLUCOTROL) 5 MG tablet Take 5 mg by mouth 2 (two) times daily before a meal.     magnesium oxide (MAG-OX) 400 (241.3 Mg) MG tablet Take 1 tablet (400 mg total) by mouth daily. 30 tablet 6   metFORMIN (GLUCOPHAGE) 1000 MG tablet Take 1 tablet (1,000 mg total) by mouth 2 (two) times daily with a meal. Resume on 09/06/2016     naproxen (NAPROSYN) 500 MG tablet Take 500 mg by mouth as needed for moderate pain or mild pain.     nitroGLYCERIN (NITROSTAT) 0.4 MG SL tablet Place 1 tablet (0.4 mg total) under the tongue every 5 (five) minutes x 3 doses as needed for chest pain. 25  tablet 3   pantoprazole (PROTONIX) 40 MG tablet Take 1 tablet (40 mg total) by mouth daily. 90 tablet 2   Tiotropium Bromide Monohydrate (SPIRIVA RESPIMAT) 2.5 MCG/ACT AERS Inhale 2 puffs into the lungs at bedtime.     carvedilol (COREG) 12.5 MG tablet Take 1 tablet (12.5 mg total) by mouth 2 (two) times daily with a meal. 180 tablet 3   valsartan (DIOVAN) 40 MG tablet Take 0.5 tablets (20 mg total) by mouth in the morning. 45 tablet 3   No current facility-administered medications for this visit.    Allergies:   Lisinopril   Social History:  The patient  reports that he has been smoking pipe. He has quit using smokeless tobacco.  His smokeless tobacco use included snuff and chew. He reports that he does not drink alcohol and does not use drugs.   Family History:  The patient's family history includes Cancer in his brother.  ROS:  Please see the history of present illness.    All other systems are reviewed and otherwise negative.   PHYSICAL EXAM:  VS:  BP (!) 128/58   Pulse 60   Ht 5\' 6"  (1.676 m)   Wt 141 lb (64 kg)   SpO2 (!) 0%   BMI 22.76 kg/m  BMI: Body mass index is 22.76 kg/m. Well nourished, well developed, in no acute distress HEENT: normocephalic, atraumatic Neck: no JVD, carotid bruits or masses Cardiac:  RRR; no significant murmurs, no rubs, or gallops Lungs:  CTA b/l, no wheezing, rhonchi or rales Abd: soft, nontender MS: no deformity or advanced atrophy Ext: chronic skin changes, no edema, LE are warm Skin: warm and dry, no rash Neuro:  No gross deficits appreciated Psych: euthymic mood, full affect   ICD site is stable, no tethering or discomfort   EKG:  not done today  Device interrogation done today and reviewed by myself:  Battery and lead measurements are good Observed to have intermittent couplets and PVCs No VT AMS episodes are FF on the A channel. ONE true AFlutter 4 seconds   02/05/2021: TTE 1. Left ventricular ejection fraction, by  estimation, 25-30%. The left  ventricle has severely decreased function. There is diffuse global  hypokinesis which appears worse in the basal-to-mid anterolateral,  basal-to-mid inferolateral, basal anterior and  basal inferior LV walls. The left ventricular internal cavity size was  mildly dilated. There is mild asymmetric left ventricular hypertrophy of  the basal-septal segment. Left ventricular diastolic parameters are  consistent with Grade I diastolic  dysfunction (impaired  relaxation).   2. Right ventricular systolic function is mildly reduced. The right  ventricular size is normal.   3. The mitral valve is normal in structure. Mild mitral valve  regurgitation.   4. 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 There is moderate calcification of the  aortic valve. There is severe thickening of the aortic valve. Aortic valve  regurgitation is mild. Mild to moderate aortic valve  sclerosis/calcification is present, without any  evidence of aortic stenosis.   5. The inferior vena cava is normal in size with greater than 50%  respiratory variability, suggesting right atrial pressure of 3 mmHg.   Comparison(s): Compared to prior echo in 2018, the LVEF appears slightly  improved to 25-30% (previously 20-25%).    09/02/2016: LHC 1. Severe triple vessel CAD s/p 3V CABG 2. Moderate distal left main stenosis 3. Subtotal occlusion of the mid LAD. Patent LIMA graft to mid LAD 4. Patent Circumflex system. Patent vein graft to the high obtuse marginal. This fills the entire lateral wall Circumflex system and some of the LAD. Of note, there is also antegrade flow into the Circumflex from injection of the left main.  5. The RCA is occluded proximally. The vein graft to the distal RCA is patent. The vein graft is small in caliber with no flow limiting stenoses.  6. Severe LV systolic dysfunction, 99991111.  7. Wide complex tachycardia. This began after access in the left radial artery. As discussed in the  narrative, we attempted DCCV x 2, bolus of amiodarone x 2, adenosine. This eventually converted to sinus with IV Lidocaine.    Recommendations: Medical management of CAD. EP to see to discuss wide complex tachycardia.   Recent Labs: 05/14/2021: Hemoglobin 10.8; Platelets 243 10/07/2021: ALT 9 11/20/2021: BUN 8; Creatinine, Ser 0.86; Potassium 5.2; Sodium 131  05/14/2021: VLDL 22 10/07/2021: Chol/HDL Ratio 3.3; Cholesterol, Total 115; HDL 35; LDL Chol Calc (NIH) 62; Triglycerides 91   CrCl cannot be calculated (Patient's most recent lab result is older than the maximum 21 days allowed.).   Wt Readings from Last 3 Encounters:  04/30/22 141 lb (64 kg)  01/19/22 139 lb 14.4 oz (63.5 kg)  10/07/21 145 lb 9.6 oz (66 kg)     Other studies reviewed: Additional studies/records reviewed today include: summarized above  ASSESSMENT AND PLAN:  ICD Intact function No programming changes made  Paroxysmal Afib CHA2DS2Vasc is 6 One true AFlutter, 4 seconds Dr. Ellyn Hack discussed with no recurrence of Afib, not placed on [place]  VT None  04/05/22 Creat 1.0, K+ 5.1 Get a mag level today + PVCs and couplets Increase coreg to 125mg  BID and reduce his valsartan to 1/2tab 20mg  daily  CAD No anginal symptoms On ASA/Plavix, BB, statin Continue with dr. Ellyn Hack  ICM Chronic CHF (systolic) No symptoms or exam findings of volume OL CorVue wobbles at threshold     Disposition: F/u with EP in a year, sooner if needed  Current medicines are reviewed at length with the patient today.  The patient did not have any concerns regarding medicines.  Venetia Night, PA-C 04/30/2022 4:43 PM     [address] [place] [address] [phone] (office)  [phone] (fax)

## 2022-04-30 NOTE — Patient Instructions (Addendum)
Medication Instructions:    START TAKING: VALSARTAN 20 MG ONCE A DAY   START TAKING  CARVEDILOL 12.5 MG  TWICE  A DAY    *If you need a refill on your cardiac medications before your next appointment, please call your pharmacy*   Lab Work: MAG TODAY    If you have labs (blood work) drawn today and your tests are completely normal, you will receive your results only by: MyChart Message (if you have MyChart) OR A paper copy in the mail If you have any lab test that is abnormal or we need to change your treatment, we will call you to review the results.   Testing/Procedures: NONE ORDERED  TODAY      Follow-Up: At [facility], you and your health needs are our priority.  As part of our continuing mission to provide you with exceptional heart care, we have created designated Provider Care Teams.  These Care Teams include your primary Cardiologist (physician) and Advanced Practice Providers (APPs -  Physician Assistants and Nurse Practitioners) who all work together to provide you with the care you need, when you need it.  We recommend signing up for the patient portal called "MyChart".  Sign up information is provided on this After Visit Summary.  MyChart is used to connect with patients for Virtual Visits (Telemedicine).  Patients are able to view lab/test results, encounter notes, upcoming appointments, etc.  Non-urgent messages can be sent to your provider as well.   To learn more about what you can do with MyChart, go to [email or web address].    Your next appointment:   1 year(s)  The format for your next appointment:   In Person  Provider:   You may see Lewayne Bunting, MD or one of the following Advanced Practice Providers on your designated Care Team:   Francis Dowse, [place] Casimiro Needle "Mardelle Matte" Lanna Poche, PA-C{   Other Instructions   Important Information About Sugar

## 2022-05-01 LAB — MAGNESIUM: Magnesium: 2.1 mg/dL (ref 1.6–2.3)

## 2022-05-04 ENCOUNTER — Telehealth: Payer: Self-pay | Admitting: Physician Assistant

## 2022-05-04 MED ORDER — CARVEDILOL 12.5 MG PO TABS: 12.5000 mg | ORAL_TABLET | Freq: Two times a day (BID) | ORAL | 3 refills | Status: DC

## 2022-05-04 MED ORDER — VALSARTAN 40 MG PO TABS: 20.0000 mg | ORAL_TABLET | Freq: Every morning | ORAL | 3 refills | Status: DC

## 2022-05-04 NOTE — Telephone Encounter (Signed)
*  STAT* If patient is at the pharmacy, call can be transferred to refill team.   1. Which medications need to be refilled? (please list name of each medication and dose if known) carvedilol (COREG) 12.5 MG tablet valsartan (DIOVAN) 40 MG tablet  2. Which pharmacy/location (including street and city if local pharmacy) is medication to be sent to?   Pt is requesting that these prescriptions be sent over to PCP Janine Ores at the [facility] at the [facility], instead of [place].  Fax number : [phone]  3. Do they need a 30 day or 90 day supply? 90

## 2022-05-04 NOTE — Telephone Encounter (Signed)
Pt's medication was faxed to the [place] clinic at [facility] at [phone]. Confirmation received. Gave it to Dr. Lubertha Basque nurse Boneta Lucks, RN

## 2022-05-05 NOTE — Progress Notes (Signed)
Remote ICD transmission.   

## 2022-05-25 ENCOUNTER — Ambulatory Visit (INDEPENDENT_AMBULATORY_CARE_PROVIDER_SITE_OTHER): Payer: Medicare Other

## 2022-05-25 DIAGNOSIS — Z95810 Presence of automatic (implantable) cardiac defibrillator: Secondary | ICD-10-CM | POA: Diagnosis not present

## 2022-05-25 DIAGNOSIS — I5022 Chronic systolic (congestive) heart failure: Secondary | ICD-10-CM | POA: Diagnosis not present

## 2022-05-27 NOTE — Progress Notes (Signed)
EPIC Encounter for ICM Monitoring  Patient Name: [name] [name] is a 76 y.o. male Date: 05/27/2022 Primary Care Physican: Dellia Cloud, MD Primary Cardiologist: Herbie Baltimore Electrophysiologist: Rae Roam Pacing: 95%     03/17/2022 Weight: 138 lbs     Spoke with wife and heart failure questions reviewed.  Pt asymptomatic for fluid accumulation.  Reports feeling well at this time and voices no complaints.    Corvue thoracic impedance suggesting normal fluid levels.   Prescribed:  Furosemide 20 mg Take 0.5 tablets (10 mg total) by mouth daily. May take additional dose as needed for worsening swelling.   Aspirin 81 mg daily   Labs: 11/20/2021 Creatinine 0.86, BUN 8,   Potassium 5.2, Sodium 131, GFR 91 11/11/2021 Creatinine 0.88, BUN 10, Potassium 5.5, Sodium 128, GFR 91 10/07/2021 Creatinine 0.86, BUN 8,   Potassium 5.3, Sodium 131, GFR 91 A complete set of results can be found in Results Review.   Recommendations:  No changes and encouraged to call if experiencing any fluid symptoms.   Follow-up plan: ICM clinic phone appointment on 07/05/2022.   91 day device clinic remote transmission 07/16/2022.     EP/Cardiology Office Visits:    08/16/2022 with Dr Herbie Baltimore.  Recall 02/05/2022 with Dr Ladona Ridgel.   Copy of ICM check sent to Dr. Ladona Ridgel.   3 month ICM trend: 05/27/2022.    Karie Soda, RN 05/27/2022 7:59 AM

## 2022-07-05 ENCOUNTER — Ambulatory Visit (INDEPENDENT_AMBULATORY_CARE_PROVIDER_SITE_OTHER): Payer: Medicare Other

## 2022-07-05 DIAGNOSIS — I5022 Chronic systolic (congestive) heart failure: Secondary | ICD-10-CM | POA: Diagnosis not present

## 2022-07-05 DIAGNOSIS — Z95810 Presence of automatic (implantable) cardiac defibrillator: Secondary | ICD-10-CM | POA: Diagnosis not present

## 2022-07-13 NOTE — Progress Notes (Signed)
EPIC Encounter for ICM Monitoring  Patient Name: [name] [name] is a 76 y.o. male Date: 07/13/2022 Primary Care Physican: John Giovanni, MD Primary Cardiologist: Ellyn Hack Electrophysiologist: Santina Evans Pacing: 85%     03/17/2022 Weight: 138 lbs  AT/AF Burden 15%      Spoke with wife and heart failure questions reviewed.  Pt asymptomatic for fluid accumulation.  Reports feeling well at this time and voices no complaints.    Corvue thoracic impedance normal but was suggesting possible fluid accumulation from 10/5-10/17.  Message sent to device clinic triage to check reports for AT/AF and further follow up if needed.    Prescribed:  Furosemide 20 mg Take 0.5 tablets (10 mg total) by mouth daily. May take additional dose as needed for worsening swelling.   Aspirin 81 mg daily   Labs: 11/20/2021 Creatinine 0.86, BUN 8,   Potassium 5.2, Sodium 131, GFR 91 11/11/2021 Creatinine 0.88, BUN 10, Potassium 5.5, Sodium 128, GFR 91 10/07/2021 Creatinine 0.86, BUN 8,   Potassium 5.3, Sodium 131, GFR 91 A complete set of results can be found in Results Review.   Recommendations:  No changes.   Follow-up plan: ICM clinic phone appointment on 08/09/2022.   91 day device clinic remote transmission 07/16/2022.     EP/Cardiology Office Visits:    08/16/2022 with Dr Ellyn Hack.  Recall 02/05/2022 with Dr Lovena Le.   Copy of ICM check sent to Dr. Lovena Le.    3 month ICM trend: 07/09/2022.    12-14 Month ICM trend:     Rosalene Billings, RN 07/13/2022 10:14 AM

## 2022-07-13 NOTE — Progress Notes (Signed)
Received: Today Simone Curia, RN  Jolly Bleicher Panda, RN Hi Margarita Grizzle,   These rates appear AT, so not AF. Continue to monitor.   Have a good day!  Leigh

## 2022-07-16 ENCOUNTER — Ambulatory Visit (INDEPENDENT_AMBULATORY_CARE_PROVIDER_SITE_OTHER): Payer: Medicare Other

## 2022-07-16 DIAGNOSIS — I472 Ventricular tachycardia, unspecified: Secondary | ICD-10-CM | POA: Diagnosis not present

## 2022-07-16 LAB — CUP PACEART REMOTE DEVICE CHECK
Battery Remaining Longevity: 24 mo
Battery Remaining Percentage: 28 %
Battery Voltage: 2.86 V
Brady Statistic AP VP Percent: 3.5 %
Brady Statistic AP VS Percent: 1 %
Brady Statistic AS VP Percent: 82 %
Brady Statistic AS VS Percent: 3.4 %
Brady Statistic RA Percent Paced: 1.9 %
Date Time Interrogation Session: 20231022075806
HighPow Impedance: 47 Ohm
HighPow Impedance: 47 Ohm
Implantable Lead Connection Status: 753985
Implantable Lead Connection Status: 753985
Implantable Lead Connection Status: 753985
Implantable Lead Implant Date: 20171215
Implantable Lead Implant Date: 20171215
Implantable Lead Implant Date: 20171215
Implantable Lead Location: 753858
Implantable Lead Location: 753859
Implantable Lead Location: 753860
Implantable Lead Model: 7122
Implantable Pulse Generator Implant Date: 20171215
Lead Channel Impedance Value: 300 Ohm
Lead Channel Impedance Value: 410 Ohm
Lead Channel Impedance Value: 430 Ohm
Lead Channel Pacing Threshold Amplitude: 0.5 V
Lead Channel Pacing Threshold Amplitude: 0.75 V
Lead Channel Pacing Threshold Amplitude: 1.125 V
Lead Channel Pacing Threshold Pulse Width: 0.5 ms
Lead Channel Pacing Threshold Pulse Width: 0.5 ms
Lead Channel Pacing Threshold Pulse Width: 0.5 ms
Lead Channel Sensing Intrinsic Amplitude: 1.5 mV
Lead Channel Sensing Intrinsic Amplitude: 11.7 mV
Lead Channel Setting Pacing Amplitude: 2 V
Lead Channel Setting Pacing Amplitude: 2 V
Lead Channel Setting Pacing Amplitude: 2.125
Lead Channel Setting Pacing Pulse Width: 0.5 ms
Lead Channel Setting Pacing Pulse Width: 0.5 ms
Lead Channel Setting Sensing Sensitivity: 0.5 mV
Pulse Gen Serial Number: 7368976

## 2022-07-22 NOTE — Progress Notes (Signed)
Remote ICD transmission.   

## 2022-07-30 ENCOUNTER — Telehealth: Payer: Self-pay

## 2022-07-30 NOTE — Telephone Encounter (Signed)
Received the following transmission alert from [employer]:  CRT-D alert received.  BiV Pacing Percent less than limit.  BiV Pacing 84%.  Previously 95% on 04/30/22. Trending down per Direct[employer].  PVC burden 12% with PVCs noted on presenting EGM.  Triaged to clinic.  74,960 AMS episodes since 04/30/22.  Burden 24%.  All available EGMs show false detection due to farfield oversensing on atrial channel.   Follow up as scheduled 08/09/22. [facility]  Of noted concern:  Patient has had a decrease in BiV pacing from 95% in August to 84% present.  (Note PVC burden is up to 12% from 3% from August check.  There is noted signs of potential fluid retention on CorVue trend (see below). Farfield oversensing occurring (may need adjustments in PVAB) Patient does not have any follow up appts scheduled.   I called and spoke with patient.  He reports ongoing fatigue but says it is not necessarily worse just ongoing.  Also, some mild signs of edema in legs that improves during the day.  Denies any acute SOB or severe fatigue, malaise or lethargy.    He does mention med changes at his August visit here in clinic but does not recall any other major changes in diet, health or medications.   Will have [place] contact patient to schedule an appt here next week in clinic with doc to review if need for further med or device adjustments to aid in improved BIV pacing and if we can adjust PVAB to help decrease false detection and burden trigger.  Also copying to Randon Goldsmith, RN and Meredith Staggers, RN in the Heart failure clinic, who also follow patient.

## 2022-08-03 NOTE — Telephone Encounter (Signed)
Attempted call to patient and left message to return call.     08/02/2022 Corvue thoracic impedance suggesting fluid levels have returned to normal.

## 2022-08-04 NOTE — Telephone Encounter (Signed)
Returned call to wife per DPR as requested by voice mail message. She was returning call for patient.  She reports he is feeling fine at this time.  Pt has appointment with Dr Ladona Ridgel on 1/17.  He does not have any fluid symptoms at this time.  His legs are weak and using motor scooter.   Advised next ICM Remote transmission scheduled for 11/20.  She said to call if it is abnormal.  Reminded that patient can take whole Furosemide tablet if he feels like he is having any fluid symptoms.

## 2022-08-06 ENCOUNTER — Ambulatory Visit: Payer: Medicare Other | Attending: Internal Medicine | Admitting: Internal Medicine

## 2022-08-06 ENCOUNTER — Encounter: Payer: Self-pay | Admitting: Internal Medicine

## 2022-08-06 VITALS — BP 90/60 | HR 69 | Ht 66.0 in | Wt 137.8 lb

## 2022-08-06 DIAGNOSIS — I5022 Chronic systolic (congestive) heart failure: Secondary | ICD-10-CM | POA: Diagnosis not present

## 2022-08-06 DIAGNOSIS — I779 Disorder of arteries and arterioles, unspecified: Secondary | ICD-10-CM

## 2022-08-06 DIAGNOSIS — Z95810 Presence of automatic (implantable) cardiac defibrillator: Secondary | ICD-10-CM | POA: Diagnosis not present

## 2022-08-06 DIAGNOSIS — I480 Paroxysmal atrial fibrillation: Secondary | ICD-10-CM | POA: Insufficient documentation

## 2022-08-06 LAB — CUP PACEART INCLINIC DEVICE CHECK
Battery Remaining Longevity: 24 mo
Brady Statistic RA Percent Paced: 2.6 %
Brady Statistic RV Percent Paced: 84 %
Date Time Interrogation Session: 20231117111120
HighPow Impedance: 50.625
Implantable Lead Connection Status: 753985
Implantable Lead Connection Status: 753985
Implantable Lead Connection Status: 753985
Implantable Lead Implant Date: 20171215
Implantable Lead Implant Date: 20171215
Implantable Lead Implant Date: 20171215
Implantable Lead Location: 753858
Implantable Lead Location: 753859
Implantable Lead Location: 753860
Implantable Lead Model: 7122
Implantable Pulse Generator Implant Date: 20171215
Lead Channel Impedance Value: 325 Ohm
Lead Channel Impedance Value: 412.5 Ohm
Lead Channel Impedance Value: 412.5 Ohm
Lead Channel Pacing Threshold Amplitude: 0.75 V
Lead Channel Pacing Threshold Amplitude: 0.75 V
Lead Channel Pacing Threshold Amplitude: 0.75 V
Lead Channel Pacing Threshold Amplitude: 0.75 V
Lead Channel Pacing Threshold Amplitude: 1.25 V
Lead Channel Pacing Threshold Amplitude: 1.25 V
Lead Channel Pacing Threshold Pulse Width: 0.5 ms
Lead Channel Pacing Threshold Pulse Width: 0.5 ms
Lead Channel Pacing Threshold Pulse Width: 0.5 ms
Lead Channel Pacing Threshold Pulse Width: 0.5 ms
Lead Channel Pacing Threshold Pulse Width: 0.5 ms
Lead Channel Pacing Threshold Pulse Width: 0.5 ms
Lead Channel Sensing Intrinsic Amplitude: 1.5 mV
Lead Channel Sensing Intrinsic Amplitude: 11.7 mV
Lead Channel Setting Pacing Amplitude: 2 V
Lead Channel Setting Pacing Amplitude: 2 V
Lead Channel Setting Pacing Amplitude: 2 V
Lead Channel Setting Pacing Pulse Width: 0.5 ms
Lead Channel Setting Pacing Pulse Width: 0.5 ms
Lead Channel Setting Sensing Sensitivity: 0.5 mV
Pulse Gen Serial Number: 7368976

## 2022-08-06 MED ORDER — CARVEDILOL 3.125 MG PO TABS: 3.1250 mg | ORAL_TABLET | Freq: Two times a day (BID) | ORAL | 3 refills | Status: DC

## 2022-08-06 MED ORDER — SOTALOL HCL 80 MG PO TABS: 80.0000 mg | ORAL_TABLET | Freq: Two times a day (BID) | ORAL | 5 refills | Status: DC

## 2022-08-06 NOTE — Patient Instructions (Addendum)
Medication Instructions:  Your physician has recommended you make the following change in your medication: Decrease Carvedilol: 3.125;   New START Medication:  Sotalol.    Carvedilol:   Take 1 tablet (3.125 mg total) by mouth 2 (two) times daily   Sotalol:   Take 1 tablet (80 mg total) by mouth 2 (two) times daily.     Lab Work: None ordered.  If you have labs (blood work) drawn today and your tests are completely normal, you will receive your results only by: MyChart Message (if you have MyChart) OR A paper copy in the mail If you have any lab test that is abnormal or we need to change your treatment, we will call you to review the results.  Testing/Procedures: None ordered.  Follow-Up:  A Nurse Visit / EKG is scheduled for 08/16/2022 at 300 pm  [facility] on [address].  ( BP check )  Dr Ladona Ridgel will adjust the Sotalol and Carvedilol based on results.    Sotalol Tablets (Betapace AF) What is this medication? SOTALOL (SOE ta lole) prevents and treats a fast or irregular heartbeat (arrhythmia). It is often used to treat a type of arrhythmia known as AFib (atrial fibrillation). It works by slowing down overactive electric signals in the heart, which stabilizes your heart rhythm. It also lowers your heart rate. It belongs to a group of medications called antiarrhythmics. This medicine may be used for other purposes; ask your health care provider or pharmacist if you have questions. COMMON BRAND NAME(S): BETAPACE AF What should I tell my care team before I take this medication? They need to know if you have any of these conditions: Diabetes Heart or vessel disease like slow heart rate, worsening heart failure, heart block, sick sinus syndrome or Raynaud's disease History of low levels of potassium or magnesium Kidney disease Liver disease Lung or breathing disease, like asthma or emphysema Pheochromocytoma Recent heart attack Thyroid disease An unusual or allergic reaction to  sotalol, other beta blockers, medications, foods, dyes, or preservatives Pregnant or trying to get pregnant Breast-feeding How should I use this medication? Take this medication by mouth with a glass of water. Follow the directions on the prescription label. Take your doses at regular intervals. Do not take your medication more often than directed. Do not stop taking this medication suddenly. This could lead to serious heart-related effects. Talk to your care team regarding the use of this medication in children. Special care may be needed. While this medication may be used in children for selected conditions precautions do apply. Overdosage: If you think you have taken too much of this medicine contact a poison control center or emergency room at once. NOTE: This medicine is only for you. Do not share this medicine with others. What if I miss a dose? If you miss a dose, take it as soon as you can. If it is almost time for your next dose, take only that dose. Do not take double or extra doses. What may interact with this medication? Do not take this medication with any of the following: Amoxapine Arsenic trioxide Certain antibiotics like gatifloxacin, grepafloxacin, levofloxacin, moxifloxacin, sparfloxacin, telithromycin Cisapride Droperidol Haloperidol [place]orn Maprotiline Medications for malaria like chloroquine and halofantrine Medications to control heart rhythm Methadone Pentamidine Phenothiazines like prochlorperazine, perphenazine, thioridazine Pimozide Ranolazine Tricyclic antidepressants like amitriptyline, imipramine, nortriptyline Vardenafil This medication may also interact with the following: Antacids Certain antibiotics such as clarithromycin and erythromycin Clonidine Digoxin Medications for angina or high blood pressure Medications for  colds and breathing difficulties Medications for diabetes Other beta blockers like atenolol, metoprolol,  propranolol Ziprasidone This list may not describe all possible interactions. Give your health care provider a list of all the medicines, herbs, non-prescription drugs, or dietary supplements you use. Also tell them if you smoke, drink alcohol, or use illegal drugs. Some items may interact with your medicine. What should I watch for while using this medication? You will be started on this medication in a specialized facility for the first two or more days of treatment. Visit your care team for regular checks on your progress. Check your heart rate and blood pressure regularly while you are taking this medication. Ask your care team what your heart rate and blood pressure should be, and when you should contact them. Your care team also may schedule regular blood tests and electrocardiograms to check your progress. Because your condition and the use of this medication carry some risk, it is a good idea to carry an identification card, necklace or bracelet with details of your condition, medications, and care team. You may get drowsy or dizzy. Do not drive, use machinery, or do anything that needs mental alertness until you know how this medication affects you. Do not stand or sit up quickly, especially if you are an older patient. This reduces the risk of dizzy or fainting spells. Alcohol can make you more drowsy and dizzy. Avoid alcoholic drinks. Do not treat yourself for coughs, colds, or pain while you are taking this medication without asking your care team for advice. Some ingredients may increase your blood pressure. If you are going to have surgery, tell your care team that you are taking this medication. What side effects may I notice from receiving this medication? Side effects that you should report to your care team as soon as possible: Allergic reactions--skin rash, itching, hives, swelling of the face, lips, tongue, or throat Heart failure--shortness of breath, swelling of the ankles, feet, or  hands, sudden weight gain, unusual weakness or fatigue Heart rhythm changes--fast or irregular heartbeat, dizziness, feeling faint or lightheaded, chest pain, trouble breathing Low blood pressure--dizziness, feeling faint or lightheaded, blurry vision Raynaud's--cool, numb, or painful fingers or toes that may change color from pale, to blue, to red Worsening mood, feelings of depression Side effects that usually do not require medical attention (report to your care team if they continue or are bothersome): Change in sex drive or performance Diarrhea Dizziness Fatigue Headache This list may not describe all possible side effects. Call your doctor for medical advice about side effects. You may report side effects to FDA at 1-800-FDA-1088. Where should I keep my medication? Keep out of the reach of children and pets. Store at room temperature between 15 and 30 degrees C (59 and 86 degrees F). Throw away any unused medication after the expiration date. NOTE: This sheet is a summary. It may not cover all possible information. If you have questions about this medicine, talk to your doctor, pharmacist, or health care provider.  2023 Elsevier/Gold Standard (2005-02-10 00:00:00)  Remote monitoring is used to monitor your ICD from home. This monitoring reduces the number of office visits required to check your device to one time per year. It allows [place] to keep an eye on the functioning of your device to ensure it is working properly. You are scheduled for a device check from home on 08/09/22. You may send your transmission at any time that day. If you have a wireless device, the transmission will  be sent automatically. After your physician reviews your transmission, you will receive a postcard with your next transmission date.

## 2022-08-06 NOTE — Progress Notes (Signed)
HPI [name] [name] returns today for followup. He has a h/o PAF, chronic systolic heart failure, s/p ICD insertion, PAD, and COPD. He has become more sedentary. He had developed worsening dyspnea and PFT's and his amiodarone was stopped a couple of years ago. He has developed worsening PAF and PVC's.  He gets tired if he works too long. He has not had any recent ICD therapies.  Allergies  Allergen Reactions   Lisinopril     Other reaction(s): Cough     Current Outpatient Medications  Medication Sig Dispense Refill   albuterol (VENTOLIN HFA) 108 (90 Base) MCG/ACT inhaler Inhale 1-2 puffs into the lungs every 6 (six) hours as needed for wheezing or shortness of breath.     Alpha-Lipoic Acid 600 MG TABS Take 600 mg by mouth daily.     aspirin EC 81 MG tablet Take 81 mg by mouth daily.     atorvastatin (LIPITOR) 80 MG tablet Take 40 mg by mouth every evening.     carvedilol (COREG) 3.125 MG tablet Take 1 tablet (3.125 mg total) by mouth 2 (two) times daily. 180 tablet 3   cholecalciferol (VITAMIN D) 25 MCG (1000 UNIT) tablet Take 2,000 Units by mouth every evening.     clopidogrel (PLAVIX) 75 MG tablet Take 1 tablet (75 mg total) by mouth daily at 6 (six) AM. 30 tablet 2   dicloxacillin (DYNAPEN) 250 MG capsule Take 500 mg by mouth 3 (three) times daily.     fluticasone (FLONASE) 50 MCG/ACT nasal spray Place 2 sprays into both nostrils daily as needed for allergies or rhinitis.     furosemide (LASIX) 20 MG tablet Take 0.5 tablets (10 mg total) by mouth daily. May take additional dose as needed for worsening swelling 60 tablet 3   glipiZIDE (GLUCOTROL) 5 MG tablet Take 5 mg by mouth 2 (two) times daily before a meal.     magnesium oxide (MAG-OX) 400 (241.3 Mg) MG tablet Take 1 tablet (400 mg total) by mouth daily. 30 tablet 6   metFORMIN (GLUCOPHAGE) 1000 MG tablet Take 1 tablet (1,000 mg total) by mouth 2 (two) times daily with a meal. Resume on 09/06/2016     naproxen (NAPROSYN) 500 MG  tablet Take 500 mg by mouth as needed for moderate pain or mild pain.     nitroGLYCERIN (NITROSTAT) 0.4 MG SL tablet Place 1 tablet (0.4 mg total) under the tongue every 5 (five) minutes x 3 doses as needed for chest pain. 25 tablet 3   pantoprazole (PROTONIX) 40 MG tablet Take 1 tablet (40 mg total) by mouth daily. 90 tablet 2   sotalol (BETAPACE) 80 MG tablet Take 1 tablet (80 mg total) by mouth 2 (two) times daily. 60 tablet 5   Tiotropium Bromide Monohydrate (SPIRIVA RESPIMAT) 2.5 MCG/ACT AERS Inhale 2 puffs into the lungs at bedtime.     valsartan (DIOVAN) 40 MG tablet Take 0.5 tablets (20 mg total) by mouth in the morning. 45 tablet 3   No current facility-administered medications for this visit.     Past Medical History:  Diagnosis Date   Acid reflux    AICD (automatic cardioverter/defibrillator) present    a. 08/2016 St. Jude (serial Number [id number]) biventricular ICD.   Anemia    Chronic systolic CHF (congestive heart failure) (HCC)    a. 08/2016 Echo: EF 20%, diffuse HK, inf, post AK, mod-sev MR, sev dil LA, mod TR, PASP .   Complication of anesthesia    "  was told I responded well to anesthesia"   COPD (chronic obstructive pulmonary disease) (HCC)    Coronary artery disease involving native coronary artery of native heart with angina pectoris (HCC)    a. 10/2012 MI after aortobifem bypass, found to have multivessel CAD -->CABG x3;  b. 08/2016 Cath: LM 60, LAD 13m, LCX 60ost/p, RCA 100p, VG->RPDA 50p, VG->OM1 ok, LIMA->LAD ok, EF 25%.   Essential hypertension 03/03/2012   History of blood transfusion 11/2012   "during his CABG"   History of gout X 1   History of stomach ulcers 1970s   Hyperlipidemia associated with type 2 diabetes mellitus (HCC) 09/16/2011   Ischemic cardiomyopathy    a. 08/2016 Echo: EF 20%, diffuse HK, inf, post AK;  b. 08/2016 St. Jude (serial Number [id number]) biventricular ICD.   Myocardial infarction [facility])    PAD (peripheral artery disease) (HCC)     s/p aortobifemoral bypass 10/2012   Peripheral neuropathy    PTSD (post-traumatic stress disorder)    Type II diabetes mellitus (HCC)    Ventricular tachycardia (HCC)    a. 08/2016 St. Jude (serial Number [id number]) biventricular ICD-->oral amio added.   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 Is now on chronic suppressive antibiotics with dicloxacillin    ROS:   All systems reviewed and negative except as noted in the HPI.   Past Surgical History:  Procedure Laterality Date   ABDOMINAL AORTOGRAM W/LOWER EXTREMITY Left 05/07/2021   Procedure: ABDOMINAL AORTOGRAM W/LOWER EXTREMITY;  Surgeon: [name], MD;  Location: [facility] INVASIVE CV LAB;  Service: Cardiovascular;  Laterality: Left;   AORTO-FEMORAL BYPASS GRAFT Bilateral 10/2012   [facility]   CARDIAC CATHETERIZATION  04/03/2013   [place] (? for abnormal Nuclear ST) --> no PCI, by report, patent grafts.   CARDIAC CATHETERIZATION N/A 09/02/2016   Procedure: Left Heart Cath and Cors/Grafts Angiography;  Surgeon: Kathleene Hazel, MD;  Location: [facility] INVASIVE CV LAB:: Ost LM 60% -> mid LAD 99% subCTO, ost-prox LCx 60%; prox RCA 100% CTO w/ (small) SVG->RPDA prox 50%; SVG->OM1 ok, LIMA->mLAD ok, EF 25% - complicated by VT during access - Shock x 2, Lidocaine & Amiodarone   CARDIAC CATHETERIZATION  11/09/2012   [facility]: due to Sustained VT post-Ob Ao-BiFem Bypass. (no Angina)   CHOLECYSTECTOMY  03/05/2012   Procedure: LAPAROSCOPIC CHOLECYSTECTOMY WITH INTRAOPERATIVE CHOLANGIOGRAM;  Surgeon: Emelia Loron, MD;  Location: [facility] OR;  Service: General;  Laterality: N/A;   CORONARY ARTERY BYPASS GRAFT  11/2012   [place]: CABG X 3 -> LIMA-mLAD, SVG-highOM1, SVG-RPDA (dRCA).   ENDARTERECTOMY FEMORAL Left 05/13/2021   Procedure: Left Femoral Endarterectomy WITH BOVINE PATCH PROFUNDOPLASTY, REVISION OF LEFT LIMB  OF AORTO-BIFEMORAL GRAFT WITH DACRON GRAFT;  Surgeon: Cephus Shelling, MD;  Location: [facility] OR;  Service: Vascular;  Laterality: Left;   EP IMPLANTABLE DEVICE N/A 09/03/2016   Procedure: BI-VENTRICULAR IMPLANTABLE CARDIOVERTER DEFIBRILLATOR  (CRT-D);  Surgeon: Marinus Maw, MD;  Location: [facility] INVASIVE CV LAB;  Service: Cardiovascular;  Laterality: N/A;   INGUINAL HERNIA REPAIR Left 1990s   INSERTION OF ILIAC STENT Left 05/13/2021   Procedure: INSERTION OF SUPERFICIAL FEMORAL ARTERY STENT TIMES THREE AND ANGIOPLASTY;  Surgeon: Cephus Shelling, MD;  Location[facility] OR;  Service: Vascular;  Laterality: Left;   INTRAOPERATIVE ARTERIOGRAM Left 05/13/2021   Procedure: INTRA OPERATIVE ARTERIOGRAM OF LEFT LOWER EXTREMITY;  Surgeon: Cephus Shelling, MD;  Location: [facility] OR;  Service: Vascular;  Laterality: Left;   STERNOTOMY  09/2013   [facility]): Sternal Wound Infection -> re-do sternotomy with metal place "sheild" placed. -Has chronic staph infection, on chronic suppressive medication   TRANSTHORACIC ECHOCARDIOGRAM  02/28/2017   EF remains 20p-25% severely reduced function. Diffuse hypokinesis. "Grade 1 diastolic dysfunction ". Severely calcified aortic valve with restricted mobility (functioning bicuspid) however no suggestion of stenosis.   TRANSTHORACIC ECHOCARDIOGRAM  01/2021   EF 25 to 30%.  Severely depressed EF.  Global HK with worst basal to mid anterolateral and inferolateral as well as anterior basal inferior walls.  GR 1 DD.  Mildly reduced RV function.  Functionally bicuspid aortic valve with moderate calcification but no stenosis.  Overall slightly improved EF.   UMBILICAL HERNIA REPAIR  03/05/2012   Procedure: HERNIA REPAIR UMBILICAL ADULT;  Surgeon: Emelia Loron, MD;  Location: [facility] OR;  Service: General;  Laterality: N/A;     Family History  Problem Relation Age of Onset   Cancer Brother        esophageal     Social History   Socioeconomic History   Marital status:  Married    Spouse name: Not on file   Number of children: Not on file   Years of education: Not on file   Highest education level: Not on file  Occupational History   Not on file  Tobacco Use   Smoking status: Every Day    Types: Pipe   Smokeless tobacco: Former    Types: Snuff, Chew   Tobacco comments:    09/03/2016 "used smokeless tobacco in my teens; quit smoking cigarettes in the early 1980s; smokes pipe only"  Vaping Use   Vaping Use: Never used  Substance and Sexual Activity   Alcohol use: No   Drug use: No   Sexual activity: Not on file  Other Topics Concern   Not on file  Social History Narrative   Routine activity around the house, but is limited more by musculoskeletal leg pain and weakness.      He enjoys smoking a pipe-long ago, he quit cigarettes   Social Determinants of Health   Financial Resource Strain: Not on file  Food Insecurity: Not on file  Transportation Needs: Not on file  Physical Activity: Not on file  Stress: Not on file  Social Connections: Not on file  Intimate Partner Violence: Not on file     BP 90/60   Pulse 69   Ht 5\' 6"  (1.676 m)   Wt 137 lb 12.8 oz (62.5 kg)   SpO2 98%   BMI 22.24 kg/m   Physical Exam:  Well appearing NAD HEENT: Unremarkable Neck:  No JVD, no thyromegally Lymphatics:  No adenopathy Back:  No CVA tenderness Lungs:  Clear with no wheezes HEART:  Regular rate rhythm, no murmurs, no rubs, no clicks Abd:  soft, positive bowel sounds, no organomegally, no rebound, no guarding Ext:  2 plus pulses, no edema, no cyanosis, no clubbing Skin:  No rashes no nodules Neuro:  CN II through XII intact, motor grossly intact  EKG - nsr with frequent PVC's  DEVICE  Normal device function.  See PaceArt for details.   Assess/Plan: PAF - I have discussed the treatment options with the patient. I have recommended he start sotalol. PVC's - he will start sotalol.  ICD - his St. Jude biv ICD is working normally. Chronic  systolic heart failure - his symptoms are class 2. He will continue his  current meds.  Sharlot Gowda Camilia Caywood,MD

## 2022-08-09 ENCOUNTER — Telehealth: Payer: Self-pay | Admitting: Internal Medicine

## 2022-08-09 ENCOUNTER — Ambulatory Visit (INDEPENDENT_AMBULATORY_CARE_PROVIDER_SITE_OTHER): Payer: Medicare Other

## 2022-08-09 DIAGNOSIS — I5022 Chronic systolic (congestive) heart failure: Secondary | ICD-10-CM | POA: Diagnosis not present

## 2022-08-09 DIAGNOSIS — Z95810 Presence of automatic (implantable) cardiac defibrillator: Secondary | ICD-10-CM

## 2022-08-09 NOTE — Telephone Encounter (Signed)
Patient's daughter is calling regarding patient's nurse visit scheduled for next Monday. Patient has a conflict due to being scheduled to see Dr. Herbie Baltimore at the same time. She is wanting to know if NL can do what is needed for the nurse visit that was scheduled. Please advise.

## 2022-08-09 NOTE — Telephone Encounter (Signed)
Pt daughter called back regarding RN Visit / EKG / BP check on 08/16/2022, and Dr. Herbie Baltimore visit Day / Time conflict.    Pt daughter told to keep Dr. Elissa Hefty appointment for her father, and that I will reach out to Dr. Elissa Hefty team, and let them know Pt is new start on Sotalol, and will need a BP check, and EKG while at Dr. Elissa Hefty appointment on 08/16/2022.    Pt daughter understood, and Dr. Ladona Ridgel / team will assess EKG preformed at Dr. Elissa Hefty office.

## 2022-08-10 NOTE — Progress Notes (Signed)
EPIC Encounter for ICM Monitoring  Patient Name: [name] [name] is a 76 y.o. male Date: 08/10/2022 Primary Care Physican: Dellia Cloud, MD Primary Cardiologist: Herbie Baltimore Electrophysiologist: Rae Roam Pacing: 84%     03/17/2022 Weight: 138 lbs 08/10/2022 Weight: 137.8 lbs   AT/AF Burden 26%     Spoke with wife and daughter (per DPR) and heart failure questions reviewed.  Transmission results reviewed.  Pt has a little swelling in feet.  Daughter reports he does not have any weight gain. He did wake up on the night of 11/19 feeling SOB.  She reports he does not follow low salt diet.     Corvue thoracic impedance suggesting possible fluid accumulation from 11/9-11/19 but returned close to baseline 11/20.  PVC burden noted 12% at 11/10 and 11/20 remote transmission (3% in August).   Pt seen by Dr Ladona Ridgel 11/17.   Prescribed:  Furosemide 20 mg Take 0.5 tablets (10 mg total) by mouth daily. May take additional dose as needed for worsening swelling.   Aspirin 81 mg daily   Labs: 11/20/2021 Creatinine 0.86, BUN 8,   Potassium 5.2, Sodium 131, GFR 91 11/11/2021 Creatinine 0.88, BUN 10, Potassium 5.5, Sodium 128, GFR 91 10/07/2021 Creatinine 0.86, BUN 8,   Potassium 5.3, Sodium 131, GFR 91 A complete set of results can be found in Results Review.   Recommendations:  Advised to take 20 mg tablet Furosemide following Thanksgiving day since she reports he will eat heavy meal with salt.     Follow-up plan: ICM clinic phone appointment on 08/30/2022 to recheck fluid levels.   91 day device clinic remote transmission 10/15/2022.     EP/Cardiology Office Visits:    08/16/2022 with Dr Herbie Baltimore.  Recall 05/22/2023 with Dr Ladona Ridgel.   Copy of ICM check sent to Dr. Ladona Ridgel.    3 month ICM trend: 08/09/2022.    12-14 Month ICM trend:     Karie Soda, RN 08/10/2022 8:37 AM

## 2022-08-10 NOTE — Telephone Encounter (Signed)
Vitals and EKG will be obtained at office visit for Dr Ladona Ridgel and  will be routed

## 2022-08-16 ENCOUNTER — Encounter: Payer: Self-pay | Admitting: Cardiology

## 2022-08-16 ENCOUNTER — Ambulatory Visit: Payer: Medicare Other | Attending: Cardiology | Admitting: Cardiology

## 2022-08-16 ENCOUNTER — Ambulatory Visit: Payer: Medicare Other

## 2022-08-16 VITALS — BP 128/64 | HR 54 | Ht 66.0 in | Wt 135.8 lb

## 2022-08-16 DIAGNOSIS — I480 Paroxysmal atrial fibrillation: Secondary | ICD-10-CM | POA: Diagnosis not present

## 2022-08-16 DIAGNOSIS — I779 Disorder of arteries and arterioles, unspecified: Secondary | ICD-10-CM

## 2022-08-16 DIAGNOSIS — E1169 Type 2 diabetes mellitus with other specified complication: Secondary | ICD-10-CM | POA: Diagnosis not present

## 2022-08-16 DIAGNOSIS — I420 Dilated cardiomyopathy: Secondary | ICD-10-CM | POA: Diagnosis not present

## 2022-08-16 DIAGNOSIS — E441 Mild protein-calorie malnutrition: Secondary | ICD-10-CM

## 2022-08-16 DIAGNOSIS — I214 Non-ST elevation (NSTEMI) myocardial infarction: Secondary | ICD-10-CM | POA: Diagnosis not present

## 2022-08-16 DIAGNOSIS — E871 Hypo-osmolality and hyponatremia: Secondary | ICD-10-CM | POA: Diagnosis not present

## 2022-08-16 DIAGNOSIS — Z951 Presence of aortocoronary bypass graft: Secondary | ICD-10-CM

## 2022-08-16 DIAGNOSIS — I2089 Other forms of angina pectoris: Secondary | ICD-10-CM | POA: Diagnosis not present

## 2022-08-16 DIAGNOSIS — R5383 Other fatigue: Secondary | ICD-10-CM | POA: Insufficient documentation

## 2022-08-16 DIAGNOSIS — Z95828 Presence of other vascular implants and grafts: Secondary | ICD-10-CM

## 2022-08-16 DIAGNOSIS — I5022 Chronic systolic (congestive) heart failure: Secondary | ICD-10-CM

## 2022-08-16 DIAGNOSIS — I739 Peripheral vascular disease, unspecified: Secondary | ICD-10-CM

## 2022-08-16 DIAGNOSIS — M6289 Other specified disorders of muscle: Secondary | ICD-10-CM | POA: Diagnosis not present

## 2022-08-16 DIAGNOSIS — I2510 Atherosclerotic heart disease of native coronary artery without angina pectoris: Secondary | ICD-10-CM

## 2022-08-16 DIAGNOSIS — I25119 Atherosclerotic heart disease of native coronary artery with unspecified angina pectoris: Secondary | ICD-10-CM | POA: Diagnosis not present

## 2022-08-16 DIAGNOSIS — F17200 Nicotine dependence, unspecified, uncomplicated: Secondary | ICD-10-CM | POA: Diagnosis not present

## 2022-08-16 DIAGNOSIS — R5382 Chronic fatigue, unspecified: Secondary | ICD-10-CM

## 2022-08-16 DIAGNOSIS — R011 Cardiac murmur, unspecified: Secondary | ICD-10-CM

## 2022-08-16 DIAGNOSIS — E785 Hyperlipidemia, unspecified: Secondary | ICD-10-CM | POA: Diagnosis not present

## 2022-08-16 NOTE — Progress Notes (Unsigned)
Primary Care Provider: Dellia Cloud, MD [facility] Cardiologist: Bryan Lemma, MD Electrophysiologist: Lewayne Bunting, MD  Clinic Note: No chief complaint on file.   ===================================  ASSESSMENT/PLAN   Problem List Items Addressed This Visit   None   ===================================  HPI:    [name] [name] is a 76 y.o. male with a PMH notable for CAD-CABG with ICM/chronic HFrEF-Class II -> s/p CRT-D), PAF and frequent PVCs as well as severe Ao-Iliac & PAD ( s/p aortobifem bypass - recent LCFA Endarterectomy & LSFA PTA-Stent), and severe COPD who presents today for ***.  Dellia Cloud, MD.  [name] was last seen on October 07, 2021 -> continue Lasix 10 mg daily with additional 10 mg as needed for swelling or greater than 3 pound weight gain.  Discussed dietary supplementation labs checked.  Recent Hospitalizations:  None  He was just seen by Dr. Ladona Ridgel on November 17-noted worsening exertional dyspnea with worsening PFTs.  Amiodarone was discontinued several years ago and he has not developed worsening PAF and PVCs.  Easy fatigue.-Class II CHF.  No ICD therapies.  EKG showed sinus rhythm with frequent PVCs.--Recommended sotalol & cut Carvedilol to 1/4 dose (3.125 mg BID)   Reviewed  CV studies:    The following studies were reviewed today: (if available, images/films reviewed: From Epic Chart or Care Everywhere) No recent studies:  Interval History:   KALIF KENTNER   CV Review of Symptoms (Summary): Cardiovascular ROS: {roscv:310661}  REVIEWED OF SYSTEMS   ROS  I have reviewed and (if needed) personally updated the patient's problem list, medications, allergies, past medical and surgical history, social and family history.   PAST MEDICAL HISTORY   Past Medical History:  Diagnosis Date   Acid reflux    AICD (automatic cardioverter/defibrillator) present    a. 08/2016 St. Jude (serial Number [id number])  biventricular ICD.   Anemia    Chronic systolic CHF (congestive heart failure) (HCC)    a. 08/2016 Echo: EF 20%, diffuse HK, inf, post AK, mod-sev MR, sev dil LA, mod TR, PASP .   Complication of anesthesia    "was told I responded well to anesthesia"   COPD (chronic obstructive pulmonary disease) (HCC)    Coronary artery disease involving native coronary artery of native heart with angina pectoris (HCC)    a. 10/2012 MI after aortobifem bypass, found to have multivessel CAD -->CABG x3;  b. 08/2016 Cath: LM 60, LAD 53m, LCX 60ost/p, RCA 100p, VG->RPDA 50p, VG->OM1 ok, LIMA->LAD ok, EF 25%.   Essential hypertension 03/03/2012   History of blood transfusion 11/2012   "during his CABG"   History of gout X 1   History of stomach ulcers 1970s   Hyperlipidemia associated with type 2 diabetes mellitus (HCC) 09/16/2011   Ischemic cardiomyopathy    a. 08/2016 Echo: EF 20%, diffuse HK, inf, post AK;  b. 08/2016 St. Jude (serial Number [id number]) biventricular ICD.   Myocardial infarction [facility])    PAD (peripheral artery disease) (HCC)    s/p aortobifemoral bypass 10/2012   Peripheral neuropathy    PTSD (post-traumatic stress disorder)    Type II diabetes mellitus (HCC)    Ventricular tachycardia (HCC)    a. 08/2016 St. Jude (serial Number [id number]) biventricular ICD-->oral amio added.   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 Is now on chronic suppressive antibiotics with dicloxacillin  PAST SURGICAL HISTORY   Past Surgical History:  Procedure Laterality Date   ABDOMINAL AORTOGRAM W/LOWER EXTREMITY Left 05/07/2021   Procedure: ABDOMINAL AORTOGRAM W/LOWER EXTREMITY;  Surgeon: [name], MD;  [facility]: [facility] INVASIVE CV LAB;  Service: Cardiovascular;  Laterality: Left;   AORTO-FEMORAL BYPASS GRAFT Bilateral 10/2012   [facility]   CARDIAC CATHETERIZATION   04/03/2013   [place] (? for abnormal Nuclear ST) --> no PCI, by report, patent grafts.   CARDIAC CATHETERIZATION N/A 09/02/2016   Procedure: Left Heart Cath and Cors/Grafts Angiography;  Surgeon: Kathleene Hazel, MD;  Location: [facility] INVASIVE CV LAB:: Ost LM 60% -> mid LAD 99% subCTO, ost-prox LCx 60%; prox RCA 100% CTO w/ (small) SVG->RPDA prox 50%; SVG->OM1 ok, LIMA->mLAD ok, EF 25% - complicated by VT during access - Shock x 2, Lidocaine & Amiodarone   CARDIAC CATHETERIZATION  11/09/2012   [facility]: due to Sustained VT post-Ob Ao-BiFem Bypass. (no Angina)   CHOLECYSTECTOMY  03/05/2012   Procedure: LAPAROSCOPIC CHOLECYSTECTOMY WITH INTRAOPERATIVE CHOLANGIOGRAM;  Surgeon: Emelia Loron, MD;  Location: [facility] OR;  Service: General;  Laterality: N/A;   CORONARY ARTERY BYPASS GRAFT  11/2012   [facility]: CABG X 3 -> LIMA-mLAD, SVG-highOM1, SVG-RPDA (dRCA).   ENDARTERECTOMY FEMORAL Left 05/13/2021   Procedure: Left Femoral Endarterectomy WITH BOVINE PATCH PROFUNDOPLASTY, REVISION OF LEFT LIMB OF AORTO-BIFEMORAL GRAFT WITH DACRON GRAFT;  Surgeon: Cephus Shelling, MD;  Location: [facility] OR;  Service: Vascular;  Laterality: Left;   EP IMPLANTABLE DEVICE N/A 09/03/2016   Procedure: BI-VENTRICULAR IMPLANTABLE CARDIOVERTER DEFIBRILLATOR  (CRT-D);  Surgeon: Marinus Maw, MD;  Location: [facility] INVASIVE CV LAB;  Service: Cardiovascular;  Laterality: N/A;   INGUINAL HERNIA REPAIR Left 1990s   INSERTION OF ILIAC STENT Left 05/13/2021   Procedure: INSERTION OF SUPERFICIAL FEMORAL ARTERY STENT TIMES THREE AND ANGIOPLASTY;  Surgeon: Cephus Shelling, MD;  Location: [facility] OR;  Service: Vascular;  Laterality: Left;   INTRAOPERATIVE ARTERIOGRAM Left 05/13/2021   Procedure: INTRA OPERATIVE ARTERIOGRAM OF LEFT LOWER EXTREMITY;  Surgeon: Cephus Shelling, MD;  Location: [facility] OR;  Service: Vascular;  Laterality: Left;   STERNOTOMY  09/2013   [facility]): Sternal Wound Infection -> re-do sternotomy with metal  place "sheild" placed. -Has chronic staph infection, on chronic suppressive medication   TRANSTHORACIC ECHOCARDIOGRAM  02/28/2017   EF remains 20p-25% severely reduced function. Diffuse hypokinesis. "Grade 1 diastolic dysfunction ". Severely calcified aortic valve with restricted mobility (functioning bicuspid) however no suggestion of stenosis.   TRANSTHORACIC ECHOCARDIOGRAM  01/2021   EF 25 to 30%.  Severely depressed EF.  Global HK with worst basal to mid anterolateral and inferolateral as well as anterior basal inferior walls.  GR 1 DD.  Mildly reduced RV function.  Functionally bicuspid aortic valve with moderate calcification but no stenosis.  Overall slightly improved EF.   UMBILICAL HERNIA REPAIR  03/05/2012   Procedure: HERNIA REPAIR UMBILICAL ADULT;  Surgeon: Emelia Loron, MD;  Location: [facility] OR;  Service: General;  Laterality: N/A;    Cath 08/2016.   Immunization History  Administered Date(s) Administered   Influenza Split 09/17/2011   Influenza-Unspecified 09/20/2018   Moderna Sars-Covid-2 Vaccination 10/10/2019, 11/09/2019   Pneumococcal-Unspecified 11/12/2014    MEDICATIONS/ALLERGIES   Current Meds  Medication Sig   albuterol (VENTOLIN HFA) 108 (90 Base) MCG/ACT inhaler Inhale 1-2 puffs into the lungs every 6 (six) hours as needed for wheezing or shortness of breath.   Alpha-Lipoic Acid 600 MG TABS Take 600 mg by mouth  daily.   aspirin EC 81 MG tablet Take 81 mg by mouth daily.   atorvastatin (LIPITOR) 80 MG tablet Take 40 mg by mouth every evening.   carvedilol (COREG) 3.125 MG tablet Take 1 tablet (3.125 mg total) by mouth 2 (two) times daily.   cholecalciferol (VITAMIN D) 25 MCG (1000 UNIT) tablet Take 2,000 Units by mouth every evening.   clopidogrel (PLAVIX) 75 MG tablet Take 1 tablet (75 mg total) by mouth daily at 6 (six) AM.   dicloxacillin (DYNAPEN) 250 MG capsule Take 500 mg by mouth 3 (three) times daily.   fluticasone (FLONASE) 50 MCG/ACT nasal spray Place  2 sprays into both nostrils daily as needed for allergies or rhinitis.   furosemide (LASIX) 20 MG tablet Take 0.5 tablets (10 mg total) by mouth daily. May take additional dose as needed for worsening swelling   glipiZIDE (GLUCOTROL) 5 MG tablet Take 5 mg by mouth 2 (two) times daily before a meal.   magnesium oxide (MAG-OX) 400 (241.3 Mg) MG tablet Take 1 tablet (400 mg total) by mouth daily.   metFORMIN (GLUCOPHAGE) 1000 MG tablet Take 1 tablet (1,000 mg total) by mouth 2 (two) times daily with a meal. Resume on 09/06/2016   naproxen (NAPROSYN) 500 MG tablet Take 500 mg by mouth as needed for moderate pain or mild pain.   nitroGLYCERIN (NITROSTAT) 0.4 MG SL tablet Place 1 tablet (0.4 mg total) under the tongue every 5 (five) minutes x 3 doses as needed for chest pain.   pantoprazole (PROTONIX) 40 MG tablet Take 1 tablet (40 mg total) by mouth daily.   sotalol (BETAPACE) 80 MG tablet Take 1 tablet (80 mg total) by mouth 2 (two) times daily.   Tiotropium Bromide Monohydrate (SPIRIVA RESPIMAT) 2.5 MCG/ACT AERS Inhale 2 puffs into the lungs at bedtime.   valsartan (DIOVAN) 40 MG tablet Take 0.5 tablets (20 mg total) by mouth in the morning.    Allergies  Allergen Reactions   Lisinopril     Other reaction(s): Cough    SOCIAL HISTORY/FAMILY HISTORY   Reviewed in Epic:  Pertinent findings:  Social History   Tobacco Use   Smoking status: Every Day    Types: Pipe   Smokeless tobacco: Former    Types: Snuff, Chew   Tobacco comments:    09/03/2016 "used smokeless tobacco in my teens; quit smoking cigarettes in the early 1980s; smokes pipe only"  Vaping Use   Vaping Use: Never used  Substance Use Topics   Alcohol use: No   Drug use: No   Social History   Social History Narrative   Routine activity around the house, but is limited more by musculoskeletal leg pain and weakness.      He enjoys smoking a pipe-long ago, he quit cigarettes    OBJCTIVE -PE, EKG, labs   Wt Readings from  Last 3 Encounters:  08/16/22 135 lb 12.8 oz (61.6 kg)  08/06/22 137 lb 12.8 oz (62.5 kg)  04/30/22 141 lb (64 kg)    Physical Exam: BP 128/64   Pulse (!) 54   Ht 5\' 6"  (1.676 m)   Wt 135 lb 12.8 oz (61.6 kg)   SpO2 96%   BMI 21.92 kg/m  Physical Exam   Adult ECG Report  Rate: *** ;  Rhythm: {rhythm:17366};   Narrative Interpretation: ***  Recent Labs:  ***  Lab Results  Component Value Date   CHOL 115 10/07/2021   HDL 35 (L) 10/07/2021   LDLCALC 62 10/07/2021  TRIG 91 10/07/2021   CHOLHDL 3.3 10/07/2021   Lab Results  Component Value Date   CREATININE 0.86 11/20/2021   BUN 8 11/20/2021   NA 131 (L) 11/20/2021   K 5.2 11/20/2021   CL 90 (L) 11/20/2021   CO2 26 11/20/2021      Latest Ref Rng & Units 05/14/2021    3:35 AM 05/13/2021    7:02 PM 05/13/2021    1:13 PM  CBC  WBC 4.0 - 10.5 K/uL 13.7  15.4    Hemoglobin 13.0 - 17.0 g/dL 38.2  50.5  39.7   Hematocrit 39.0 - 52.0 % 31.8  37.2  38.0   Platelets 150 - 400 K/uL 243  267      Lab Results  Component Value Date   HGBA1C 7.2 (H) 10/07/2021   Lab Results  Component Value Date   TSH 6.87 (H) 09/08/2016    ================================================== I spent a total of ***minutes with the patient spent in direct patient consultation.  Additional time spent with chart review  / charting (studies, outside notes, etc): *** min Total Time: *** min  Current medicines are reviewed at length with the patient today.  (+/- concerns) ***  Notice: This dictation was prepared with Dragon dictation along with smart phrase technology. Any transcriptional errors that result from this process are unintentional and may not be corrected upon review.  Studies Ordered:   No orders of the defined types were placed in this encounter.  No orders of the defined types were placed in this encounter.   Patient Instructions / Medication Changes & Studies & Tests Ordered   There are no Patient Instructions on file for  this visit.     Marykay Lex, MD, MS Bryan Lemma, M.D., M.S. Interventional Cardiologist  [facility]  Pager # [phone] Phone # [phone] [address] [place], [place] [address]   Thank you for choosing [facility] [place]!!

## 2022-08-16 NOTE — Patient Instructions (Addendum)
Medication Instructions:   Take a one month  statin Holiday - meaning-- do not take Atorvastatin .  If  your symptoms get better then restart with 1/2 tablet daily . If  no changes  the restart Atorvastatin .   *If you need a refill on your cardiac medications before your next appointment, please call your pharmacy*   Lab Work: Not needed   If you have labs (blood work) drawn today and your tests are completely normal, you will receive your results only by: MyChart Message (if you have MyChart) OR A paper copy in the mail If you have any lab test that is abnormal or we need to change your treatment, we will call you to review the results.   Testing/Procedures:  Will be schedule at [address] Your physician has requested that you have an echocardiogram. Echocardiography is a painless test that uses sound waves to create images of your heart. It provides your doctor with information about the size and shape of your heart and how well your heart's chambers and valves are working. This procedure takes approximately one hour. There are no restrictions for this procedure. Please do NOT wear cologne, perfume, aftershave, or lotions (deodorant is allowed). Please arrive 15 minutes prior to your appointment time.   And  [id number]   Your doctor has scheduled you for a Myocardial Perfusion scan oto obtain information about the blood flow to your heart. The test consists of taking pictures of your heart in two phases: while resting and after a stress test. You will be given a drug intended to have a similar effect on the heart to that of exercise.  The test will take approximately 3 to 4  hours to complete.How to prepare for your test: Do not eat or drink 2 hours prior to your test Do not consume products containing caffeine 12 hours prior to your test (examples: coffee (regular OR decaf), chocolate, sodas, tea) Your doctor may need you to hold certain medications prior  to the test.  If so, these are listed below and should not be taken for 24 hours prior to the test.  If not listed below, you may take your medications as normal.  You may resume taking held medications on your normal schedule once the test is complete.   Meds to hold: none  Do bring a list of your current medications with you.  If you have held any meds in preparation for the test, please bring them, as you may be required to take them once the test is completed. Do wear comfortable clothes and walking shoes.  Do not wear dresses or overalls. Do NOT wear cologne, perfume, aftershave, or fragranced lotions the day of your test (deodorants okay). If these instructions are not followed your test will have to be rescheduled.   A nuclear cardiologist will review your test, prepare a report and send it to your physician.   If you have questions or concerns about your appointment, you can call the Nuclear Cardiology department at [phone]. If you cannot keep your appointment, please provide 48 hours notification to avoid a possible $50.00 charge to your account.   Please arrive 15 minutes prior to your appointment time for registration and insurance purposes    Follow-Up: At [facility], you and your health needs are our priority.  As part of our continuing mission to provide you with exceptional heart care, we have created designated Provider Care Teams.  These Care  Teams include your primary Cardiologist (physician) and Advanced Practice Providers (APPs -  Physician Assistants and Nurse Practitioners) who all work together to provide you with the care you need, when you need it.  We recommend signing up for the patient portal called "MyChart".  Sign up information is provided on this After Visit Summary.  MyChart is used to connect with patients for Virtual Visits (Telemedicine).  Patients are able to view lab/test results, encounter notes, upcoming appointments, etc.  Non-urgent messages can  be sent to your provider as well.   To learn more about what you can do with MyChart, go to [email or web address].    Your next appointment:   3 month(s)  The format for your next appointment:   In Person  Provider:   Bryan Lemma, MD    Other Instructions    Increase  protein intake 0 to your diet

## 2022-08-17 ENCOUNTER — Encounter: Payer: Self-pay | Admitting: Cardiology

## 2022-08-17 NOTE — Assessment & Plan Note (Signed)
I do think a lot of his fatigue is also related to poor nutrition and low protein levels.  We talked about increasing his nutrition level.  This should also help with his edema.  Need to reassess at his weights routinely to account for either weight gains or weight loss is that are more related to diet.  Basically daily, day in and day out weight changes are likely related to edema, however week the changes are probably more related to nutrition.

## 2022-08-17 NOTE — Assessment & Plan Note (Addendum)
We had planned on ischemic evaluation last year, that did not happen because of illness.  Now with him having some concerning symptoms but like could proceed with ischemic evaluation with Lexiscan Myoview.  Remains on aspirin and Plavix On low-dose beta-blocker, along with sotalol.  (Concern for bradycardia) High-dose atorvastatin. Remains on low-dose ARB.

## 2022-08-17 NOTE — Assessment & Plan Note (Signed)
No sign of recurrent episodes on ICD interrogation.  Now being maintained on sotalol which is also helping his PVCs.  On reduced dose of beta-blocker.  Not currently on DOAC because of aspirin and Plavix.  With no recurrent symptoms, and remaining on AAD, will hold off on DOAC for now.

## 2022-08-17 NOTE — Assessment & Plan Note (Addendum)
Last echo was in May 2022.  EF showed little improvement, but now he is more symptomatic.  Plan to reassess echo for EF assessment but as well as murmur on exam.  He did functionally bicuspid aortic valve, but does not sound like that on exam.  No longer on high-dose carvedilol-reduce dose down to 3.25 mg twice daily along with reduced dose of Diovan-now taking 20 mg daily. On standing dose of Lasix 10 mg daily.  We talked about sliding scale.  I am little worried about him how much he is urinating but it could simply be because of prostate disease.. Blood pressures also been an issue.  Therefore not able to titrate medications.  Not on spironolactone.  BP would not tolerate Entresto (also not covered by [facility])  Has BiV ICD/CRT-D but minimal improvement in EF.  Plan: Continue current meds for now, but recheck EF back up and ischemia with Myoview.

## 2022-08-17 NOTE — Assessment & Plan Note (Signed)
He does have some unusual chest discomfort episodes and it is hard to tell with his lack of activity without this could be anginal equivalent.  Has not had ischemic evaluation since 2017.  Plan: [employer]

## 2022-08-17 NOTE — Assessment & Plan Note (Signed)
As our discussion back in January, we decided not to order a follow-up Myoview because he was feeling well.  He plan was to only check if there are symptoms.  Now that he is noting progression of symptoms, we will go and check Lexiscan Myoview.

## 2022-08-17 NOTE — Assessment & Plan Note (Signed)
Distant history of MI.  Now with severe ischemic cardiomyopathy with multivessel CABG.  Very low EF.

## 2022-08-17 NOTE — Assessment & Plan Note (Signed)
Should be due for lab check in January.  With him having some leg fatigue and weakness, I would like to do a statin holiday.  If after a month he feels no difference, he will go back on full dose statin, however if he does note improvement, would recommend cut back to half dose of statin.  He is on metformin for diabetes, would like to try SGLT2 inhibitor given his cardiomyopathy.  Need to make sure it is covered by his his [place].

## 2022-08-17 NOTE — Assessment & Plan Note (Signed)
New murmur heard on exam, check 2D echocardiogram to make sure this is simply his aortic valve but not MR.

## 2022-08-17 NOTE — Assessment & Plan Note (Signed)
Not interested in quitting 

## 2022-08-17 NOTE — Assessment & Plan Note (Signed)
Much improved having reduced his dose of carvedilol and on sotalol.  We will try a statin holiday to determine if this is medication related.

## 2022-08-24 ENCOUNTER — Telehealth: Payer: Self-pay | Admitting: Internal Medicine

## 2022-08-24 NOTE — Telephone Encounter (Signed)
Patient saw Dr. Herbie Baltimore on 08/16/22 and had EKG and vitals signs at that visit. Will send message to see when patient needs EKG and vitals signs again. Sotalol was started on 08/06/22.

## 2022-08-24 NOTE — Telephone Encounter (Signed)
Pt c/o medication issue:  1. Name of Medication: sotalol (BETAPACE) 80 MG tablet   2. How are you currently taking this medication (dosage and times per day)?  3. Are you having a reaction (difficulty breathing--STAT)?   4. What is your medication issue? Pt daughter calling to set up nurses visit for EKG and BP check since starting this medication

## 2022-08-24 NOTE — Telephone Encounter (Signed)
noted 

## 2022-08-25 NOTE — Telephone Encounter (Signed)
Message sent to Dr. Ladona Ridgel, FYI EKG and Bp available for assessment.  I will follow up with Dr. Ladona Ridgel regarding this question.

## 2022-08-27 NOTE — Telephone Encounter (Signed)
Per Dr. Lubertha Basque review 08/26/22, no changes to medication / dosing.

## 2022-08-30 ENCOUNTER — Ambulatory Visit (INDEPENDENT_AMBULATORY_CARE_PROVIDER_SITE_OTHER): Payer: Medicare Other

## 2022-08-30 ENCOUNTER — Telehealth (HOSPITAL_COMMUNITY): Payer: Self-pay

## 2022-08-30 DIAGNOSIS — Z95810 Presence of automatic (implantable) cardiac defibrillator: Secondary | ICD-10-CM

## 2022-08-30 DIAGNOSIS — I5022 Chronic systolic (congestive) heart failure: Secondary | ICD-10-CM

## 2022-08-30 NOTE — Telephone Encounter (Signed)
Spoke with the patient's wife, she stated that she have him here for his appointment. Asked to call back with any questions. S.Deola Rewis EMTP

## 2022-08-31 NOTE — Progress Notes (Signed)
EPIC Encounter for ICM Monitoring  Patient Name: [name] [name] is a 76 y.o. male Date: 08/31/2022 Primary Care Physican: Dellia Cloud, MD Primary Cardiologist: Herbie Baltimore Electrophysiologist: Rae Roam Pacing: 85%     03/17/2022 Weight: 138 lbs 08/10/2022 Weight: 137.8 lbs 08/31/2022 Weight: 127 lbs   AT/AF Burden 22%     Spoke with wife, per DPR, and heart failure questions reviewed.  Transmission results reviewed.  Pt did have SOB a couple of weeks ago but resolved after taking extra Furosemide.   Corvue thoracic impedance suggesting fluid levels returned to normal since 11/19.   Prescribed:  Furosemide 20 mg Take 0.5 tablets (10 mg total) by mouth daily. May take additional dose as needed for worsening swelling.   Aspirin 81 mg daily   Labs: 11/20/2021 Creatinine 0.86, BUN 8,   Potassium 5.2, Sodium 131, GFR 91 11/11/2021 Creatinine 0.88, BUN 10, Potassium 5.5, Sodium 128, GFR 91 10/07/2021 Creatinine 0.86, BUN 8,   Potassium 5.3, Sodium 131, GFR 91 A complete set of results can be found in Results Review.   Recommendations:  No changes and encouraged to call if experiencing any fluid symptoms.    Follow-up plan: ICM clinic phone appointment on 10/11/2022.   91 day device clinic remote transmission 10/15/2022.     EP/Cardiology Office Visits:  11/15/2022 with Dr Herbie Baltimore.  Recall 05/22/2023 with Dr Ladona Ridgel.   Copy of ICM check sent to Dr. Ladona Ridgel.    3 month ICM trend: 08/30/2022.    12-14 Month ICM trend:     Karie Soda, RN 08/31/2022 2:14 PM

## 2022-08-31 NOTE — Addendum Note (Signed)
Addended by: Marykay Lex on: 08/31/2022 08:54 AM   Modules accepted: Orders

## 2022-09-07 ENCOUNTER — Other Ambulatory Visit (HOSPITAL_COMMUNITY): Payer: Medicare Other

## 2022-09-07 ENCOUNTER — Encounter (HOSPITAL_COMMUNITY): Payer: Medicare Other

## 2022-10-05 ENCOUNTER — Ambulatory Visit (HOSPITAL_BASED_OUTPATIENT_CLINIC_OR_DEPARTMENT_OTHER): Payer: Medicare Other

## 2022-10-05 ENCOUNTER — Ambulatory Visit (HOSPITAL_COMMUNITY): Payer: Medicare Other | Attending: Cardiology

## 2022-10-05 DIAGNOSIS — I480 Paroxysmal atrial fibrillation: Secondary | ICD-10-CM

## 2022-10-05 DIAGNOSIS — I5022 Chronic systolic (congestive) heart failure: Secondary | ICD-10-CM

## 2022-10-05 DIAGNOSIS — Z951 Presence of aortocoronary bypass graft: Secondary | ICD-10-CM

## 2022-10-05 DIAGNOSIS — M6289 Other specified disorders of muscle: Secondary | ICD-10-CM

## 2022-10-05 DIAGNOSIS — R011 Cardiac murmur, unspecified: Secondary | ICD-10-CM

## 2022-10-05 DIAGNOSIS — R5382 Chronic fatigue, unspecified: Secondary | ICD-10-CM

## 2022-10-05 DIAGNOSIS — E785 Hyperlipidemia, unspecified: Secondary | ICD-10-CM

## 2022-10-05 DIAGNOSIS — I214 Non-ST elevation (NSTEMI) myocardial infarction: Secondary | ICD-10-CM

## 2022-10-05 DIAGNOSIS — F17200 Nicotine dependence, unspecified, uncomplicated: Secondary | ICD-10-CM | POA: Diagnosis not present

## 2022-10-05 DIAGNOSIS — E871 Hypo-osmolality and hyponatremia: Secondary | ICD-10-CM | POA: Diagnosis not present

## 2022-10-05 DIAGNOSIS — I420 Dilated cardiomyopathy: Secondary | ICD-10-CM

## 2022-10-05 DIAGNOSIS — E1169 Type 2 diabetes mellitus with other specified complication: Secondary | ICD-10-CM

## 2022-10-05 DIAGNOSIS — E441 Mild protein-calorie malnutrition: Secondary | ICD-10-CM

## 2022-10-05 HISTORY — PX: TRANSTHORACIC ECHOCARDIOGRAM: SHX275

## 2022-10-05 HISTORY — PX: NM MYOVIEW LTD: HXRAD82

## 2022-10-05 LAB — MYOCARDIAL PERFUSION IMAGING
LV dias vol: 261 mL (ref 62–150)
LV sys vol: 187 mL
Nuc Stress EF: 28 %
Peak HR: 73 {beats}/min
Rest HR: 60 {beats}/min
Rest Nuclear Isotope Dose: 10.9 mCi
SDS: 4
SRS: 12
SSS: 17
ST Depression (mm): 0 mm
Stress Nuclear Isotope Dose: 32.8 mCi
TID: 1.03

## 2022-10-05 LAB — ECHOCARDIOGRAM COMPLETE
AR max vel: 0.9 cm2
AV Area VTI: 0.91 cm2
AV Area mean vel: 0.86 cm2
AV Mean grad: 7.4 mmHg
AV Peak grad: 13.8 mmHg
Ao pk vel: 1.86 m/s
Area-P 1/2: 3.27 cm2
Height: 66 in
MV M vel: 5.07 m/s
MV Peak grad: 102.6 mmHg
P 1/2 time: 554 msec
S' Lateral: 4.8 cm
Weight: 2160 oz

## 2022-10-05 MED ORDER — TECHNETIUM TC 99M TETROFOSMIN IV KIT
10.9000 | PACK | Freq: Once | INTRAVENOUS | Status: AC | PRN
  Administered 2022-10-05: 10.9 via INTRAVENOUS

## 2022-10-05 MED ORDER — REGADENOSON 0.4 MG/5ML IV SOLN
0.4000 mg | Freq: Once | INTRAVENOUS | Status: AC
  Administered 2022-10-05: 0.4 mg via INTRAVENOUS

## 2022-10-05 MED ORDER — TECHNETIUM TC 99M TETROFOSMIN IV KIT
32.8000 | PACK | Freq: Once | INTRAVENOUS | Status: AC | PRN
  Administered 2022-10-05: 32.8 via INTRAVENOUS

## 2022-10-11 ENCOUNTER — Ambulatory Visit (INDEPENDENT_AMBULATORY_CARE_PROVIDER_SITE_OTHER): Payer: Medicare Other

## 2022-10-11 DIAGNOSIS — I5022 Chronic systolic (congestive) heart failure: Secondary | ICD-10-CM

## 2022-10-11 DIAGNOSIS — Z95810 Presence of automatic (implantable) cardiac defibrillator: Secondary | ICD-10-CM | POA: Diagnosis not present

## 2022-10-13 NOTE — Progress Notes (Signed)
EPIC Encounter for ICM Monitoring  Patient Name: [name] [name] is a 77 y.o. male Date: 10/13/2022 Primary Care Physican: John Giovanni, MD Primary Cardiologist: Ellyn Hack Electrophysiologist: Santina Evans Pacing: 86%     03/17/2022 Weight: 138 lbs 08/10/2022 Weight: 137.8 lbs 08/31/2022 Weight: 127 lbs 10/13/2022 Weight: 130 lbs   AT/AF Burden 16%     Spoke with wife/patient, per DPR, and heart failure questions reviewed.  Transmission results reviewed.  Pt reports he is doing well and takes 20 mg Lasix for SOB.    Corvue thoracic impedance suggesting normal fluid levels with exception of possible fluid accumulation from 12/30-1/8.   Prescribed:  Furosemide 20 mg Take 0.5 tablets (10 mg total) by mouth daily. May take additional dose as needed for worsening swelling.   Aspirin 81 mg daily   Labs: 11/20/2021 Creatinine 0.86, BUN 8,   Potassium 5.2, Sodium 131, GFR 91 11/11/2021 Creatinine 0.88, BUN 10, Potassium 5.5, Sodium 128, GFR 91 10/07/2021 Creatinine 0.86, BUN 8,   Potassium 5.3, Sodium 131, GFR 91 A complete set of results can be found in Results Review.   Recommendations:  No changes and encouraged to call if experiencing any fluid symptoms.    Follow-up plan: ICM clinic phone appointment on 11/15/2022.   91 day device clinic remote transmission 01/14/2023.     EP/Cardiology Office Visits:  11/15/2022 with Dr Ellyn Hack.  Recall 05/22/2023 with Dr Lovena Le.   Copy of ICM check sent to Dr. Lovena Le.     3 month ICM trend: 09/21/2022.    12-14 Month ICM trend:     Rosalene Billings, RN 10/13/2022 12:34 PM

## 2022-10-15 ENCOUNTER — Ambulatory Visit (INDEPENDENT_AMBULATORY_CARE_PROVIDER_SITE_OTHER): Payer: Medicare Other

## 2022-10-15 DIAGNOSIS — I420 Dilated cardiomyopathy: Secondary | ICD-10-CM | POA: Diagnosis not present

## 2022-10-18 LAB — CUP PACEART REMOTE DEVICE CHECK
Battery Remaining Longevity: 24 mo
Battery Remaining Percentage: 28 %
Battery Voltage: 2.84 V
Brady Statistic AP VP Percent: 3.9 %
Brady Statistic AP VS Percent: 1 %
Brady Statistic AS VP Percent: 83 %
Brady Statistic AS VS Percent: 3 %
Brady Statistic RA Percent Paced: 2 %
Date Time Interrogation Session: 20240126144013
HighPow Impedance: 51 Ohm
HighPow Impedance: 51 Ohm
Implantable Lead Connection Status: 753985
Implantable Lead Connection Status: 753985
Implantable Lead Connection Status: 753985
Implantable Lead Implant Date: 20171215
Implantable Lead Implant Date: 20171215
Implantable Lead Implant Date: 20171215
Implantable Lead Location: 753858
Implantable Lead Location: 753859
Implantable Lead Location: 753860
Implantable Lead Model: 7122
Implantable Pulse Generator Implant Date: 20171215
Lead Channel Impedance Value: 340 Ohm
Lead Channel Impedance Value: 410 Ohm
Lead Channel Impedance Value: 430 Ohm
Lead Channel Pacing Threshold Amplitude: 0.625 V
Lead Channel Pacing Threshold Amplitude: 0.75 V
Lead Channel Pacing Threshold Amplitude: 1 V
Lead Channel Pacing Threshold Pulse Width: 0.5 ms
Lead Channel Pacing Threshold Pulse Width: 0.5 ms
Lead Channel Pacing Threshold Pulse Width: 0.5 ms
Lead Channel Sensing Intrinsic Amplitude: 1.2 mV
Lead Channel Sensing Intrinsic Amplitude: 11.7 mV
Lead Channel Setting Pacing Amplitude: 2 V
Lead Channel Setting Pacing Amplitude: 2 V
Lead Channel Setting Pacing Amplitude: 2 V
Lead Channel Setting Pacing Pulse Width: 0.5 ms
Lead Channel Setting Pacing Pulse Width: 0.5 ms
Lead Channel Setting Sensing Sensitivity: 0.5 mV
Pulse Gen Serial Number: 7368976

## 2022-11-01 NOTE — Progress Notes (Signed)
Remote ICD transmission.   

## 2022-11-15 ENCOUNTER — Ambulatory Visit (INDEPENDENT_AMBULATORY_CARE_PROVIDER_SITE_OTHER): Payer: Medicare Other

## 2022-11-15 ENCOUNTER — Encounter: Payer: Self-pay | Admitting: Cardiology

## 2022-11-15 ENCOUNTER — Ambulatory Visit: Payer: Medicare Other | Attending: Cardiology | Admitting: Cardiology

## 2022-11-15 VITALS — BP 116/54 | HR 62 | Ht 66.0 in | Wt 138.0 lb

## 2022-11-15 DIAGNOSIS — I25119 Atherosclerotic heart disease of native coronary artery with unspecified angina pectoris: Secondary | ICD-10-CM | POA: Diagnosis not present

## 2022-11-15 DIAGNOSIS — E785 Hyperlipidemia, unspecified: Secondary | ICD-10-CM | POA: Diagnosis not present

## 2022-11-15 DIAGNOSIS — R011 Cardiac murmur, unspecified: Secondary | ICD-10-CM | POA: Insufficient documentation

## 2022-11-15 DIAGNOSIS — I5022 Chronic systolic (congestive) heart failure: Secondary | ICD-10-CM | POA: Insufficient documentation

## 2022-11-15 DIAGNOSIS — I2089 Other forms of angina pectoris: Secondary | ICD-10-CM | POA: Diagnosis not present

## 2022-11-15 DIAGNOSIS — Z951 Presence of aortocoronary bypass graft: Secondary | ICD-10-CM | POA: Diagnosis not present

## 2022-11-15 DIAGNOSIS — Z95828 Presence of other vascular implants and grafts: Secondary | ICD-10-CM | POA: Diagnosis not present

## 2022-11-15 DIAGNOSIS — Z8679 Personal history of other diseases of the circulatory system: Secondary | ICD-10-CM | POA: Insufficient documentation

## 2022-11-15 DIAGNOSIS — E1169 Type 2 diabetes mellitus with other specified complication: Secondary | ICD-10-CM | POA: Insufficient documentation

## 2022-11-15 DIAGNOSIS — I420 Dilated cardiomyopathy: Secondary | ICD-10-CM | POA: Diagnosis not present

## 2022-11-15 DIAGNOSIS — Z95810 Presence of automatic (implantable) cardiac defibrillator: Secondary | ICD-10-CM

## 2022-11-15 DIAGNOSIS — I10 Essential (primary) hypertension: Secondary | ICD-10-CM | POA: Diagnosis not present

## 2022-11-15 DIAGNOSIS — I214 Non-ST elevation (NSTEMI) myocardial infarction: Secondary | ICD-10-CM | POA: Diagnosis not present

## 2022-11-15 DIAGNOSIS — I480 Paroxysmal atrial fibrillation: Secondary | ICD-10-CM | POA: Diagnosis not present

## 2022-11-15 DIAGNOSIS — I472 Ventricular tachycardia, unspecified: Secondary | ICD-10-CM | POA: Diagnosis not present

## 2022-11-15 DIAGNOSIS — Z9889 Other specified postprocedural states: Secondary | ICD-10-CM | POA: Insufficient documentation

## 2022-11-15 DIAGNOSIS — I739 Peripheral vascular disease, unspecified: Secondary | ICD-10-CM

## 2022-11-15 DIAGNOSIS — I70229 Atherosclerosis of native arteries of extremities with rest pain, unspecified extremity: Secondary | ICD-10-CM | POA: Insufficient documentation

## 2022-11-15 NOTE — Progress Notes (Signed)
EPIC Encounter for ICM Monitoring  Patient Name: [name] [name] is a 77 y.o. male Date: 11/15/2022 Primary Care Physican: John Giovanni, MD Primary Cardiologist: Ellyn Hack Electrophysiologist: Santina Evans Pacing: 88%     08/31/2022 Weight: 127 lbs 10/13/2022 Weight: 130 lbs   AT/AF Burden 13%     Transmission results reviewed.    Corvue thoracic impedance suggesting normal fluid levels.   Prescribed:  Furosemide 20 mg Take 0.5 tablets (10 mg total) by mouth daily. May take additional dose as needed for worsening swelling.   Aspirin 81 mg daily   Labs: 11/20/2021 Creatinine 0.86, BUN 8,   Potassium 5.2, Sodium 131, GFR 91 11/11/2021 Creatinine 0.88, BUN 10, Potassium 5.5, Sodium 128, GFR 91 10/07/2021 Creatinine 0.86, BUN 8,   Potassium 5.3, Sodium 131, GFR 91 A complete set of results can be found in Results Review.   Recommendations:  No changes.    Follow-up plan: ICM clinic phone appointment on 12/20/2022.   91 day device clinic remote transmission 01/14/2023.     EP/Cardiology Office Visits:  11/15/2022 with Dr Ellyn Hack.  Recall 05/22/2023 with Dr Lovena Le.   Copy of ICM check sent to Dr. Lovena Le.    3 month ICM trend: 11/15/2022.     12-14 Month ICM trend:     Rosalene Billings, RN 11/15/2022 7:55 AM

## 2022-11-15 NOTE — Patient Instructions (Addendum)
Medication Instructions:   No changes  *If you need a refill on your cardiac medications before your next appointment, please call your pharmacy*   Lab Work: Not needed I   Testing/Procedures: Not needed   Follow-Up: At [facility], you and your health needs are our priority.  As part of our continuing mission to provide you with exceptional heart care, we have created designated Provider Care Teams.  These Care Teams include your primary Cardiologist (physician) and Advanced Practice Providers (APPs -  Physician Assistants and Nurse Practitioners) who all work together to provide you with the care you need, when you need it.     Your next appointment:   9 month(s) Nov 2024  The format for your next appointment:   In Person  Provider:   Glenetta Hew, MD

## 2022-11-15 NOTE — Progress Notes (Signed)
Primary Care Provider: John Giovanni, [place] Cardiologist: Glenetta Hew, MD Electrophysiologist: Cristopher Peru, MD  Clinic Note: Chief Complaint  Patient presents with   Follow-up    3 months.   ===================================  ASSESSMENT/PLAN   Problem List Items Addressed This Visit       Cardiology Problems   Ventricular tachycardia ([place]) (Chronic)    No further episodes of VT noted on ICD interrogation. Amiodarone discontinued due to toxicity.  Now on sotalol, and low-dose beta-blocker. Monitored by Dr. Lovena Le.      Paroxysmal atrial fibrillation (HCC) (Chronic)    Has not had any breakthrough spells of A-fib now that has been on sotalol.  This is also controlling his PVCs.  Again is on aspirin Plavix as opposed to DOAC.  With no recurrent episodes on his ICD, we will hold off on DOAC, but low threshold to switch to Xarelto or Eliquis plus aspirin and stop Plavix.      PAD (peripheral artery disease) (HCC) (Chronic)    Severe PAD with diabetic ulcers on both feet.  Very limited mobility.  Needs to stay established with vascular surgery-will refer back to Dr. Carlis Abbott.      Myocardial infarction (HCC) (Chronic)    Severe ischemic cardiomyopathy but improved EF despite CABG.  Clear evidence of inferior inferolateral infarct with akinesis on echo and infarction seen on Myoview.  This is consistent with the anatomy seen on cath with severe LAD disease and occluded RCA.  On stable regimen now but very deconditioned and weak.      Hyperlipidemia associated with type 2 diabetes mellitus ([place]) (Chronic)    Labs checked at the [facility] show significant worsening LDL and total cholesterol level having done statin holiday at last visit.  Since he did not have a change in symptoms with the statin holiday, we will restart at 40 mg atorvastatin.  Amazingly with diabetic foot ulcers and severe PAD he is on metformin and glipizide for diabetes.  Class I  indication for him to have be on SGLT2 inhibitor but not covered. => [facility].      Essential hypertension (Chronic)    Pretty well-controlled on low-dose carvedilol and valsartan.      Critical lower limb ischemia (HCC) (Chronic)    He does have diabetic ulcers, but no signs of poor healing.  Currently being seen by podiatry for wound care and on antibiotics.  Will asked that he be followed back up by vascular surgery.  Will forward this note to Dr. Carlis Abbott.      Coronary artery disease involving native coronary artery of native heart with angina pectoris ([place]) (Chronic)    Not actively having any anginal symptoms at this point on current dose of sotalol and carvedilol.  No ischemia on recent Myoview.  There is evidence of a pretty significant inferior inferolateral infarction which correlates well with RCA infarct in the setting of LAD disease.  Sizable infarct but no evidence of ischemia.  Plan: Continue current dose of carvedilol 3.25 mg twice daily and valsartan 40 mg daily. On valsartan as opposed to [place] due to lack of [facility] coverage despite class I indication Restart atorvastatin 40 mg daily as his lipids manage dramatic jump to the wrong direction.  Was previously well-controlled on 40 mg atorvastatin. Not on SGLT2 inhibitor (see CHF section) Is on aspirin and Plavix based on the extent of CAD and PAD.  With no recurrence of A-fib, chosen antiplatelet agents.  We  could consider low-dose Xarelto plus aspirin in the future if remains stable, but in the absence of ongoing symptoms we will continue to manage as is      Congestive dilated cardiomyopathy ([place]) - Mostly Ischemic, but Also Potentially Arrhythmogenic (Chronic)   Chronic HFrEF (heart failure with reduced ejection fraction) (HCC) - Primary (Chronic)    Pretty much stable NYHA class II symptoms.  Dyspnea is probably more related to pulmonary disease.  Recent echo showed improved EF to 40 to 45%  suggesting improvement related to CRT-D and reduced activity with addition of sotalol. Denying any PND orthopnea seems relatively euvolemic on exam.  Really on low-dose Lasix.  Intermittently takes 20 mg as opposed to 10.  Plan: Now on significantly reduced dose of carvedilol down to 3.25 mg twice daily => blood pressures have stayed stable and energy level improved. Also on low-dose valsartan 40 mg daily (due to [place] coverage -> lack of coverage of class I indication medication for chronic HFpEF.  Therefore using inferior medication, put him back on valsartan) Again due to lack of [facility] coverage, not on SGLT2 inhibitor. => Wilder Glade or Jardiance highly recommended in the setting of diabetes, CAD, PAD and CHF Remains on very low-dose Lasix 10 mg daily for maintenance taking 2 to 3 days additional 10 mg. Not on spironolactone because of borderline pressures, and relatively euvolemic. EF improved status post CRT-D      Atypical angina (Chronic)    When I last saw him he was noticing episodes of chest discomfort which now seem to be probably more musculoskeletal in nature.  No longer having any symptoms.  Myoview showed no evidence of ischemia which was similar to 2017.         Other   Status post aortobifemoral bypass surgery (Chronic)   S/P CABG x 3 (Chronic)    Significant ischemic cardiomyopathy with large inferior infarct.  Last cath showed patent grafts with severe native disease.  Most recent Myoview showed no evidence of ischemia.       S/P AAA repair (Chronic)    Most recently followed by Dr. Carlis Abbott from vascular surgery.  We will re-consult for management based on presence of diabetic ulcers.      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  Followed by EP clinic.  Following thoracic impedance and making adjustments on diuretic dosing.  Maintaining pretty much euvolemia.  Also EF now improved with initiation of sotalol to allow for more pacing.       Holosystolic murmur (Chronic)    Pretty significant MR seen on echo.  Relatively euvolemic at this point and so we can hold off on more aggressive management, but will ask structural heart team to review.       ===================================  HPI:    [name] [name] is a 77 y.o. male with a complicated problem list reviewed above who presents today for 2-78-monthfollow-up to discuss results of his stress test and echo.  Pertinent PMH: CAD-CABG w/ ICM/Chronic NYHA II HFrEF (s/p CRT-D),  PAF & Freq PVCs along with history of amiodarone toxicity; now on sotalol;  NOT on DOAC2/ no evidence of Recurrence on ICD interrogation => on ASA/Plavix Severe Ao-Iliac Disease (AAA) /-s/p Ao-BiFem Bypass  PAD (& L LCFA CEA/LSFA PTA-Stent),  Small L 1st Toe Ulcer; R Heel 0.5 cm ulcer (limits activity) --> Referred to Podiatrist Ordered open heel boot for R foot.  Chronic COPD & Protein Malnutrition HLD - recent statin holiday -  no change in Sx, but Lipids got much worse -> restarting Statin DM-2 with PN  [name] [name] was last seen on August 16, 2022 as a close follow-up from his visit with Dr. Lovena Le on November 17 bradycardia noted worsening exertional dyspnea and worsening PFTs.  EKG showed sinus rhythm with frequent PVCs: Carvedilol dose reduced to 3.125 mg twice daily and sotalol initiated.  When I saw him 10 days later, he said his energy level is definitely improved with reduced dose of carvedilol and addition of sotalol.  He also noted significantly improved palpitation symptoms.  Still quite disabled and debilitated.  Pretty much always in the wheelchair or scooter.  Only does minimal walking around the house.  No real orthopnea-sleeps on 1 pillow to sleep with sleep in the recliner.  Has been following smoking levels pretty well and Nutrisystem's based on multiple measurements.  Fatigue did not really feel like noodles ".  Little more energy.  Significant weight loss.  Obvious  exertional dyspnea with intermittent chest discomfort. Myoview And Echocardiogram ordered  Recent Hospitalizations: None  He recently transferred to his [facility] care from [facility] to [place] (to be closer to home) -> has been referred to podiatry for ulcers, but also will need to be seen by [place] cardiology in order to get sotalol prescribed through the [place].  [employer] [facility] [employer]  Podiatry consult 11/05/2022 ([facility]):  Ordered Open Heel Boot for R foot, Wound Care Instructions provided.  Remains on dicloxacillin suppression.   Reviewed  CV studies:    The following studies were reviewed today: (if available, images/films reviewed: From Epic Chart or Care Everywhere) Myoview 10/05/2022: EF~28%.  Severely reduced function.  High risk due to decreased EF.  Large fixed severe defect in the mid to basal inferior and inferolateral wall with associated wall motion abnormality consistent with prior infarction.  No evidence of reversible defect/ischemia. TTE 10/05/2022: EF 40 to 45%.  Basal to mid anterolateral and inferolateral akinesis with basal inferior akinesis.  GR 2 DD.  Severely elevated PAP with mild RV dilation.  RV P estimated 67 mmHg).  Severe LA dilation.  Mild to moderate RA dilation.  Mild to moderate TR.  Severe MR likely with restricted posterior leaflet and inferolateral wall motion.  Moderate AOV calcification with no stenosis.  Mild AI.  Interval History:   [name] [name] presents here today for close follow-up to discuss results of the studies.  Most recent thoracic impedance levels suggested normal fluid levels and he seems to be doing quite well.  He is only taking 10 mg of Lasix daily and really only notes mild swelling in bilateral ankles.  Still sleeping on 1 pillow with no real orthopnea or PND.  He says he now is doing much better relaxing better and able to sleep maybe 8 to 10 hours at night.  He says his energy level is still better on the sotalol and reduced  dose of beta-blocker.  Partly because of his right heel ulcer and toe ulcerations feet are bothering him quite a bit, but also he has pretty poor balance with neuropathy symptoms.  He has not had any falls, but has been scared to do much walking.  Uses a walker minimally.  His legs are just extremely weak.  His dyspnea is definitely better.  He has been able to do a good job with supplying his diet using Glucerna shakes.  Weights have been stabilized now.  Really rarely uses an additional full dose,  but may take an additional half dose of furosemide 2 to 3 days a week.    We had tried a statin holiday and his lipids made a dramatic change to the worse.  He did not notice any change in symptoms, will therefore restart.  His total cholesterol went up to 170 from 123, and LDL went up to 120 from 70.  Interestingly, his A1c also went up.  Still dealing with diabetic foot ulcers -> referred to podiatry for assistance with wound care.  Has asked to be referred back to vascular surgery to monitor his lower extremity arterial disease.  Interestingly, we reviewed the results of his tests.  Happy to see the EF actually improved likely result of reduced PVCs with sotalol and finally seeing an effect of CRT pacing.  This goes along with his lack of PND and orthopnea and pretty well-controlled edema.  CV Review of Symptoms (Summary): positive for - dyspnea on exertion, shortness of breath, and well-controlled edema with very low-dose of diuretic.  Notably less palpitations.  Chronic fatigue.  Stable weight; poor balance, some orthostatic dizziness but mostly poor balance due to peripheral neuropathy.  Generalized weakness -> not walking enough to notice any claudication bilateral pedal ulcers as noted. negative for - chest pain, irregular heartbeat, orthopnea, paroxysmal nocturnal dyspnea, rapid heart rate, or syncope/near syncope or TIA/amaurosis fugax  REVIEWED OF SYSTEMS   Review of Systems  Constitutional:   Positive for malaise/fatigue (Still worn out, but doing better now that he is on sotalol and low-dose of beta-blocker). Negative for weight loss (Weights are now stable -> doing well with nutrition supplementation.).  HENT:  Negative for congestion.   Respiratory:  Positive for cough, shortness of breath and wheezing.        Really baseline COPD related dyspnea.  Gastrointestinal:  Negative for blood in stool, constipation and melena.  Genitourinary:  Negative for dysuria, frequency and hematuria.  Musculoskeletal:  Positive for joint pain and myalgias (No real change being off of statin.  Mostly just weak legs.  They ache and feel tired). Negative for falls (Usually able to catch himself before he falls.  Not walking much, uses either a walker or wheelchair.  Very rarely uses cane).  Neurological:  Positive for dizziness (Orthostatic), focal weakness (Legs can barely support his weight.) and weakness (Overall generalized weakness and fatigue).  Endo/Heme/Allergies:  Bruises/bleeds easily.  Psychiatric/Behavioral:  Positive for depression (Remarkably improved depression symptoms.) and memory loss (Mild). The patient is not nervous/anxious and does not have insomnia (Sleeping much better now.).     I have reviewed and (if needed) personally updated the patient's problem list, medications, allergies, past medical and surgical history, social and family history.   PAST MEDICAL HISTORY   Past Medical History:  Diagnosis Date   Acid reflux    AICD (automatic cardioverter/defibrillator) present    a. 08/2016 St. Jude (serial Number [id number]) biventricular ICD.   Anemia    Chronic systolic CHF (congestive heart failure) ([place])    a. 08/2016 Echo: EF 20%, diffuse HK, inf, post AK, mod-sev MR, sev dil LA, mod TR, PASP 80mHg.   Complication of anesthesia    "was told I responded well to anesthesia"   COPD (chronic obstructive pulmonary disease) (HCC)    Coronary artery disease involving native  coronary artery of native heart with angina pectoris ([place]    a. 10/2012 MI after aortobifem bypass, found to have multivessel CAD -->CABG x3;  b. 08/2016 Cath: LM 60, LAD 978m  LCX 60ost/p, RCA 100p, VG->RPDA 50p, VG->OM1 ok, LIMA->LAD ok, EF 25%.   Essential hypertension 03/03/2012   History of blood transfusion 11/2012   "during his CABG"   History of gout X 1   History of stomach ulcers 1970s   Hyperlipidemia associated with type 2 diabetes mellitus ([place]) 09/16/2011   Ischemic cardiomyopathy    a. 08/2016 Echo: EF 20%, diffuse HK, inf, post AK;  b. 08/2016 St. Jude (serial Number [id number]) biventricular ICD.   Myocardial infarction [facility])    PAD (peripheral artery disease) (HCC)    s/p aortobifemoral bypass 10/2012   Peripheral neuropathy    PTSD (post-traumatic stress disorder)    Type II diabetes mellitus (HCC)    Ventricular tachycardia ([place])    a. 08/2016 St. Jude (serial Number [id number]) biventricular ICD-->oral amio added.   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 Is now on chronic suppressive antibiotics with dicloxacillin    PAST SURGICAL HISTORY   Past Surgical History:  Procedure Laterality Date   ABDOMINAL AORTOGRAM W/LOWER EXTREMITY Left 05/07/2021   Procedure: ABDOMINAL AORTOGRAM W/LOWER EXTREMITY;  Surgeon: Cherre Robins, MD;  Location: [place] [facility];  Service: Cardiovascular;  Laterality: Left;   AORTO-FEMORAL BYPASS GRAFT Bilateral 10/2012   [facility]   CARDIAC CATHETERIZATION  04/03/2013   [place] (? for abnormal Nuclear ST) --> no PCI, by report, patent grafts.   CARDIAC CATHETERIZATION N/A 09/02/2016   Procedure: Left Heart Cath and Cors/Grafts Angiography;  Surgeon: Burnell Blanks, MD;  [facility]: [facility] INVASIVE [facility] LAB:: Ost LM 60% -> mid LAD 99% subCTO, ost-prox LCx 60%; prox RCA 100% CTO w/ (small) SVG->RPDA prox 50%;  SVG->OM1 ok, LIMA->mLAD ok, EF 123456 - complicated by VT during access - Shock x 2, Lidocaine & Amiodarone   CARDIAC CATHETERIZATION  11/09/2012   [facility]: due to Sustained VT post-Ob Ao-BiFem Bypass. (no Angina)   CHOLECYSTECTOMY  03/05/2012   Procedure: LAPAROSCOPIC CHOLECYSTECTOMY WITH INTRAOPERATIVE CHOLANGIOGRAM;  Surgeon: Rolm Bookbinder, MD;  Location: [place];  Service: General;  Laterality: N/A;   CORONARY ARTERY BYPASS GRAFT  11/2012   [facility]: CABG X 3 -> LIMA-mLAD, SVG-highOM1, SVG-RPDA (dRCA).   ENDARTERECTOMY FEMORAL Left 05/13/2021   Procedure: Left Femoral Endarterectomy WITH BOVINE PATCH PROFUNDOPLASTY, REVISION OF LEFT LIMB OF AORTO-BIFEMORAL GRAFT WITH DACRON GRAFT;  Surgeon: Marty Heck, MD;  Location: [place];  Service: Vascular;  Laterality: Left;   EP IMPLANTABLE DEVICE N/A 09/03/2016   Procedure: BI-VENTRICULAR IMPLANTABLE CARDIOVERTER DEFIBRILLATOR  (CRT-D);  Surgeon: Evans Lance, MD;  Location: [place] [facility];  Service: Cardiovascular;  Laterality: N/A;   INGUINAL HERNIA REPAIR Left 1990s   INSERTION OF ILIAC STENT Left 05/13/2021   Procedure: INSERTION OF SUPERFICIAL FEMORAL ARTERY STENT TIMES THREE AND ANGIOPLASTY;  Surgeon: Marty Heck, MD;  Location: [place];  Service: Vascular;  Laterality: Left;   INTRAOPERATIVE ARTERIOGRAM Left 05/13/2021   Procedure: INTRA OPERATIVE ARTERIOGRAM OF LEFT LOWER EXTREMITY;  Surgeon: Marty Heck, MD;  Location: [place];  Service: Vascular;  Laterality: Left;   NM MYOVIEW LTD  10/05/2022   EF ~28%.  Severely reduced function.  High risk due to decreased EF.  Large fixed severe defect in the mid to basal inferior and inferolateral wall with associated wall motion abnormality consistent with prior infarction.  No evidence of reversible defect/ischemia. =? Echo EF 40-45% with mid to apical  anterior/inferolateral akinesis and inferior akinesis.   STERNOTOMY  09/2013   ([place]): Sternal Wound Infection ->  re-do sternotomy with metal place "sheild" placed. -Has chronic staph infection, on chronic suppressive medication   TRANSTHORACIC ECHOCARDIOGRAM  10/05/2022   EF 40 to 45%.  Basal to mid anterolateral and inferolateral akinesis with basal inferior akinesis.  GR 2 DD.  Severely elevated PAP with mild RV dilation.  RV P estimated 67 mmHg).  Severe LA dilation.  Mild to moderate RA dilation.  Mild to moderate TR.  Severe MR likely with restricted posterior leaflet and inferolateral wall motion.  Moderate AOV calcification with no stenosis.  Mild AI.   TRANSTHORACIC ECHOCARDIOGRAM  01/2021   EF 25 to 30%.  Severely depressed EF.  Global HK with worst basal to mid anterolateral and inferolateral as well as anterior basal inferior walls.  GR 1 DD.  Mildly reduced RV function.  Functionally bicuspid aortic valve with moderate calcification but no stenosis.  Overall slightly improved EF.   UMBILICAL HERNIA REPAIR  03/05/2012   Procedure: HERNIA REPAIR UMBILICAL ADULT;  Surgeon: Rolm Bookbinder, MD;  Location: [place];  Service: General;  Laterality: N/A;    Cath 08/2016.    MEDICATIONS/ALLERGIES   Current Meds  Medication Sig   albuterol (VENTOLIN HFA) 108 (90 Base) MCG/ACT inhaler Inhale 1-2 puffs into the lungs every 6 (six) hours as needed for wheezing or shortness of breath.   Alpha-Lipoic Acid 600 MG TABS Take 600 mg by mouth daily.   aspirin EC 81 MG tablet Take 81 mg by mouth daily.   atorvastatin (LIPITOR) 80 MG tablet Take 40 mg by mouth every evening.   carvedilol (COREG) 3.125 MG tablet Take 1 tablet (3.125 mg total) by mouth 2 (two) times daily.   cholecalciferol (VITAMIN D) 25 MCG (1000 UNIT) tablet Take 2,000 Units by mouth every evening.   clopidogrel (PLAVIX) 75 MG tablet Take 1 tablet (75 mg total) by mouth daily at 6 (six) AM.   dicloxacillin (DYNAPEN) 250 MG capsule Take 500 mg by mouth 3 (three) times daily.   fluticasone (FLONASE) 50 MCG/ACT nasal spray Place 2 sprays into both  nostrils daily as needed for allergies or rhinitis.   furosemide (LASIX) 20 MG tablet Take 0.5 tablets (10 mg total) by mouth daily. May take additional dose as needed for worsening swelling   glipiZIDE (GLUCOTROL) 5 MG tablet Take 5 mg by mouth 2 (two) times daily before a meal.   magnesium oxide (MAG-OX) 400 (241.3 Mg) MG tablet Take 1 tablet (400 mg total) by mouth daily.   metFORMIN (GLUCOPHAGE) 1000 MG tablet Take 1 tablet (1,000 mg total) by mouth 2 (two) times daily with a meal. Resume on 09/06/2016   naproxen (NAPROSYN) 500 MG tablet Take 500 mg by mouth as needed for moderate pain or mild pain.   nitroGLYCERIN (NITROSTAT) 0.4 MG SL tablet Place 1 tablet (0.4 mg total) under the tongue every 5 (five) minutes x 3 doses as needed for chest pain.   pantoprazole (PROTONIX) 40 MG tablet Take 1 tablet (40 mg total) by mouth daily.   sotalol (BETAPACE) 80 MG tablet Take 1 tablet (80 mg total) by mouth 2 (two) times daily.   Tiotropium Bromide Monohydrate (SPIRIVA RESPIMAT) 2.5 MCG/ACT AERS Inhale 2 puffs into the lungs at bedtime.   valsartan (DIOVAN) 40 MG tablet Take 0.5 tablets (20 mg total) by mouth in the morning.    Allergies  Allergen Reactions   Lisinopril  Other reaction(s): Cough    SOCIAL HISTORY/FAMILY HISTORY   Reviewed in Epic:  Pertinent findings:  Social History   Tobacco Use   Smoking status: Every Day    Types: Pipe   Smokeless tobacco: Former    Types: Snuff, Chew   Tobacco comments:    09/03/2016 "used smokeless tobacco in my teens; quit smoking cigarettes in the early 1980s; smokes pipe only"  Vaping Use   Vaping Use: Never used  Substance Use Topics   Alcohol use: No   Drug use: No   Social History   Social History Narrative   Routine activity around the house, but is limited more by musculoskeletal leg pain and weakness.      He enjoys smoking a pipe-long ago, he quit cigarettes    OBJCTIVE -PE, EKG, labs   Wt Readings from Last 3 Encounters:   11/15/22 138 lb (62.6 kg)  10/05/22 135 lb (61.2 kg)  08/16/22 135 lb 12.8 oz (61.6 kg)   Glucerna / day  Physical Exam: BP (!) 116/54 (BP Location: Left Arm, Patient Position: Sitting, Cuff Size: Normal)   Pulse 62   Ht '5\' 6"'$  (1.676 m)   Wt 138 lb (62.6 kg)   BMI 22.27 kg/m  Physical Exam Constitutional:      General: He is not in acute distress.    Appearance: He is ill-appearing (Chronically ill-appearing.  Still thin and frail with now stable weight.  Wheelchair bound mostly.). He is not toxic-appearing or diaphoretic.  HENT:     Head: Normocephalic and atraumatic.  Cardiovascular:     Rate and Rhythm: Normal rate and regular rhythm.     Chest Wall: PMI is displaced (Mild lateral displacement).     Pulses: Decreased pulses (Barely palpable pedal pulses.).     Heart sounds: S1 normal and S2 normal. Murmur heard.     High-pitched blowing holosystolic murmur is present with a grade of 2/6 at the apex.     No friction rub.  Pulmonary:     Effort: Pulmonary effort is normal. No respiratory distress.     Breath sounds: Wheezing (Mild baseline wheeze) and rhonchi present. No rales.     Comments: Mild diffuse interstitial sounds are clear with cough Chest:     Chest wall: No tenderness.  Musculoskeletal:        General: No swelling.     Cervical back: Normal range of motion.  Skin:    General: Skin is warm and dry.     Coloration: Skin is pale.  Neurological:     Mental Status: He is alert and oriented to person, place, and time.     Motor: Weakness (B LE) present.     Comments: Wheelchair-bound  Psychiatric:        Judgment: Judgment normal.     Comments: Seems to be in much better spirits.     Adult ECG Report N/A  Recent Labs:   10/14/2022: A1c 7.3, TC 170, TG 161, HDL 31, LDL 120; alk phos 70, AST 12, ALT 13, T. bili 0.6; Na+ 130, K+ 5.1,Cl- 94, BUN 17, Cr 0.7 CO2 30, Glu 125, Ca2+ 9.4 Lab Results  Component Value Date   CHOL 115 10/07/2021   HDL 35 (L)  10/07/2021   LDLCALC 62 10/07/2021   TRIG 91 10/07/2021   CHOLHDL 3.3 10/07/2021   Lab Results  Component Value Date   CREATININE 0.86 11/20/2021   BUN 8 11/20/2021   NA 131 (L) 11/20/2021   K  5.2 11/20/2021   CL 90 (L) 11/20/2021   CO2 26 11/20/2021      Latest Ref Rng & Units 05/14/2021    3:35 AM 05/13/2021    7:02 PM 05/13/2021    1:13 PM  CBC  WBC 4.0 - 10.5 K/uL 13.7  15.4    Hemoglobin 13.0 - 17.0 g/dL 10.8  12.2  12.9   Hematocrit 39.0 - 52.0 % 31.8  37.2  38.0   Platelets 150 - 400 K/uL 243  267      Lab Results  Component Value Date   HGBA1C 7.2 (H) 10/07/2021   Lab Results  Component Value Date   TSH 6.87 (H) 09/08/2016    ================================================== I spent a total of 43 minutes with the patient spent in direct patient consultation.  Additional time spent with chart review  / charting (studies, outside notes, etc): 36 min Total Time: 79 min  Current medicines are reviewed at length with the patient today.  (+/- concerns) N/A  Notice: This dictation was prepared with Dragon dictation along with smart phrase technology. Any transcriptional errors that result from this process are unintentional and may not be corrected upon review.  Studies Ordered:   No orders of the defined types were placed in this encounter.  No orders of the defined types were placed in this encounter.   Patient Instructions / Medication Changes & Studies & Tests Ordered   Patient Instructions  Medication Instructions:   No changes  *If you need a refill on your cardiac medications before your next appointment, please call your pharmacy*   Lab Work: Not needed I   Testing/Procedures: Not needed   Follow-Up: At [facility], you and your health needs are our priority.  As part of our continuing mission to provide you with exceptional heart care, we have created designated Provider Care Teams.  These Care Teams include your primary Cardiologist  (physician) and Advanced Practice Providers (APPs -  Physician Assistants and Nurse Practitioners) who all work together to provide you with the care you need, when you need it.     Your next appointment:   9 month(s) Nov 2024  The format for your next appointment:   In Person  Provider:   Glenetta Hew, MD         Leonie Man, MD, MS Glenetta Hew, M.D., M.S. Interventional Cardiologist  [place]  Pager # [phone] Phone # [phone] [address] [place], [place] [address]   Thank you for choosing [facility] [place]!!

## 2022-11-21 ENCOUNTER — Encounter: Payer: Self-pay | Admitting: Cardiology

## 2022-11-21 NOTE — Assessment & Plan Note (Signed)
Labs checked at the [facility] show significant worsening LDL and total cholesterol level having done statin holiday at last visit.  Since he did not have a change in symptoms with the statin holiday, we will restart at 40 mg atorvastatin.  Amazingly with diabetic foot ulcers and severe PAD he is on metformin and glipizide for diabetes.  Class I indication for him to have be on SGLT2 inhibitor but not covered. => [facility].

## 2022-11-21 NOTE — Assessment & Plan Note (Signed)
He does have diabetic ulcers, but no signs of poor healing.  Currently being seen by podiatry for wound care and on antibiotics.  Will asked that he be followed back up by vascular surgery.  Will forward this note to Dr. Carlis Abbott.

## 2022-11-21 NOTE — Assessment & Plan Note (Signed)
Most recently followed by Dr. Carlis Abbott from vascular surgery.  We will re-consult for management based on presence of diabetic ulcers.

## 2022-11-21 NOTE — Assessment & Plan Note (Signed)
Severe PAD with diabetic ulcers on both feet.  Very limited mobility.  Needs to stay established with vascular surgery-will refer back to Dr. Carlis Abbott.

## 2022-11-21 NOTE — Assessment & Plan Note (Signed)
When I last saw him he was noticing episodes of chest discomfort which now seem to be probably more musculoskeletal in nature.  No longer having any symptoms.  Myoview showed no evidence of ischemia which was similar to 2017.

## 2022-11-21 NOTE — Assessment & Plan Note (Signed)
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  Followed by EP clinic.  Following thoracic impedance and making adjustments on diuretic dosing.  Maintaining pretty much euvolemia.  Also EF now improved with initiation of sotalol to allow for more pacing.

## 2022-11-21 NOTE — Assessment & Plan Note (Signed)
No further episodes of VT noted on ICD interrogation. Amiodarone discontinued due to toxicity.  Now on sotalol, and low-dose beta-blocker. Monitored by Dr. Lovena Le.

## 2022-11-21 NOTE — Assessment & Plan Note (Signed)
Pretty much stable NYHA class II symptoms.  Dyspnea is probably more related to pulmonary disease.  Recent echo showed improved EF to 40 to 45% suggesting improvement related to CRT-D and reduced activity with addition of sotalol. Denying any PND orthopnea seems relatively euvolemic on exam.  Really on low-dose Lasix.  Intermittently takes 20 mg as opposed to 10.  Plan: Now on significantly reduced dose of carvedilol down to 3.25 mg twice daily => blood pressures have stayed stable and energy level improved. Also on low-dose valsartan 40 mg daily (due to [place] coverage -> lack of coverage of class I indication medication for chronic HFpEF.  Therefore using inferior medication, put him back on valsartan) Again due to lack of [facility] coverage, not on SGLT2 inhibitor. => [place] or Jardiance highly recommended in the setting of diabetes, CAD, PAD and CHF Remains on very low-dose Lasix 10 mg daily for maintenance taking 2 to 3 days additional 10 mg. Not on spironolactone because of borderline pressures, and relatively euvolemic. EF improved status post CRT-D

## 2022-11-21 NOTE — Assessment & Plan Note (Signed)
Has not had any breakthrough spells of A-fib now that has been on sotalol.  This is also controlling his PVCs.  Again is on aspirin Plavix as opposed to DOAC.  With no recurrent episodes on his ICD, we will hold off on DOAC, but low threshold to switch to Xarelto or Eliquis plus aspirin and stop Plavix.

## 2022-11-21 NOTE — Assessment & Plan Note (Signed)
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  Clear evidence of inferior inferolateral infarct with akinesis on echo and infarction seen on Myoview.  This is consistent with the anatomy seen on cath with severe LAD disease and occluded RCA.  On stable regimen now but very deconditioned and weak.

## 2022-11-21 NOTE — Assessment & Plan Note (Signed)
Significant ischemic cardiomyopathy with large inferior infarct.  Last cath showed patent grafts with severe native disease.  Most recent Myoview showed no evidence of ischemia.

## 2022-11-21 NOTE — Assessment & Plan Note (Signed)
Pretty significant MR seen on echo.  Relatively euvolemic at this point and so we can hold off on more aggressive management, but will ask structural heart team to review.

## 2022-11-21 NOTE — Assessment & Plan Note (Addendum)
Pretty well-controlled on low-dose carvedilol and valsartan.

## 2022-11-21 NOTE — Assessment & Plan Note (Signed)
Not actively having any anginal symptoms at this point on current dose of sotalol and carvedilol.  No ischemia on recent Myoview.  There is evidence of a pretty significant inferior inferolateral infarction which correlates well with RCA infarct in the setting of LAD disease.  Sizable infarct but no evidence of ischemia.  Plan: Continue current dose of carvedilol 3.25 mg twice daily and valsartan 40 mg daily. On valsartan as opposed to [place] due to lack of [facility] coverage despite class I indication Restart atorvastatin 40 mg daily as his lipids manage dramatic jump to the wrong direction.  Was previously well-controlled on 40 mg atorvastatin. Not on SGLT2 inhibitor (see CHF section) Is on aspirin and Plavix based on the extent of CAD and PAD.  With no recurrence of A-fib, chosen antiplatelet agents.  We could consider low-dose Xarelto plus aspirin in the future if remains stable, but in the absence of ongoing symptoms we will continue to manage as is

## 2022-11-22 ENCOUNTER — Encounter: Payer: Self-pay | Admitting: Cardiology

## 2022-11-22 NOTE — Telephone Encounter (Signed)
20 mg seems more reasonable

## 2022-12-14 ENCOUNTER — Other Ambulatory Visit: Payer: Self-pay | Admitting: Physician Assistant

## 2022-12-14 ENCOUNTER — Ambulatory Visit (HOSPITAL_COMMUNITY)
Admission: RE | Admit: 2022-12-14 | Discharge: 2022-12-14 | Disposition: A | Discharge status: Home / Self Care | Payer: Medicare Other | Source: Ambulatory Visit | Attending: Physician Assistant | Admitting: Physician Assistant

## 2022-12-14 ENCOUNTER — Ambulatory Visit (INDEPENDENT_AMBULATORY_CARE_PROVIDER_SITE_OTHER): Payer: Medicare Other | Admitting: Physician Assistant

## 2022-12-14 ENCOUNTER — Ambulatory Visit (INDEPENDENT_AMBULATORY_CARE_PROVIDER_SITE_OTHER)
Admission: RE | Admit: 2022-12-14 | Discharge: 2022-12-14 | Disposition: A | Discharge status: Home / Self Care | Payer: Medicare Other | Source: Ambulatory Visit | Attending: Physician Assistant | Admitting: Physician Assistant

## 2022-12-14 VITALS — BP 125/73 | HR 62 | Temp 97.9°F | Resp 20 | Ht 66.0 in | Wt 138.0 lb

## 2022-12-14 DIAGNOSIS — I70229 Atherosclerosis of native arteries of extremities with rest pain, unspecified extremity: Secondary | ICD-10-CM | POA: Insufficient documentation

## 2022-12-14 DIAGNOSIS — I779 Disorder of arteries and arterioles, unspecified: Secondary | ICD-10-CM | POA: Diagnosis not present

## 2022-12-14 LAB — VAS US ABI WITH/WO TBI
Left ABI: 0.98
Right ABI: 0.64

## 2022-12-14 NOTE — Progress Notes (Signed)
Office Note   History of Present Illness   [name] [name] is a 77 y.o. ([birth year]) male who presents for surveillance of PAD.  He has a remote history of aortobifemoral bypass done at the [place] in 2014.  Most recently on 05/13/2021 he required left common femoral and SFA endarterectomy, revision of aortobifem left limb, and left SFA stent placement by Dr. Chestine Spore.  This was done for tissue loss on the left great toe.  At his most recent follow-up with [place], his left great toe wound had almost healed.  He had no rest pain or new wounds of his lower extremities.  He returns today for follow-up.  Over the last month he developed a wound on his right heel, which has been under the care of his podiatrist.  Both him and his family states that the wound was fairly deep when it began, but it has made great progress in healing.  It is now much more shallow than it used to be.  He denies any erythema or signs of infection.  His left great toe ulceration is very small now.  He denies any rest pain.  He still has claudication at long distances, however he mostly only ambulates around his house.  Current Outpatient Medications  Medication Sig Dispense Refill   albuterol (VENTOLIN HFA) 108 (90 Base) MCG/ACT inhaler Inhale 1-2 puffs into the lungs every 6 (six) hours as needed for wheezing or shortness of breath.     Alpha-Lipoic Acid 600 MG TABS Take 600 mg by mouth daily.     aspirin EC 81 MG tablet Take 81 mg by mouth daily.     atorvastatin (LIPITOR) 80 MG tablet Take 40 mg by mouth every evening.     carvedilol (COREG) 3.125 MG tablet Take 1 tablet (3.125 mg total) by mouth 2 (two) times daily. 180 tablet 3   cholecalciferol (VITAMIN D) 25 MCG (1000 UNIT) tablet Take 2,000 Units by mouth every evening.     clopidogrel (PLAVIX) 75 MG tablet Take 1 tablet (75 mg total) by mouth daily at 6 (six) AM. 30 tablet 2   dicloxacillin (DYNAPEN) 250 MG capsule Take 500 mg by mouth 3 (three) times daily.     fluticasone  (FLONASE) 50 MCG/ACT nasal spray Place 2 sprays into both nostrils daily as needed for allergies or rhinitis.     furosemide (LASIX) 20 MG tablet Take 0.5 tablets (10 mg total) by mouth daily. May take additional dose as needed for worsening swelling (Patient taking differently: Take 20 mg by mouth daily. May take additional dose as needed for worsening swelling) 60 tablet 3   glipiZIDE (GLUCOTROL) 5 MG tablet Take 5 mg by mouth 2 (two) times daily before a meal.     lidocaine (LIDODERM) 5 % Place onto the skin.     magnesium oxide (MAG-OX) 400 (241.3 Mg) MG tablet Take 1 tablet (400 mg total) by mouth daily. 30 tablet 6   metFORMIN (GLUCOPHAGE) 1000 MG tablet Take 1 tablet (1,000 mg total) by mouth 2 (two) times daily with a meal. Resume on 09/06/2016     naproxen (NAPROSYN) 500 MG tablet Take 500 mg by mouth as needed for moderate pain or mild pain.     nitroGLYCERIN (NITROSTAT) 0.4 MG SL tablet Place 1 tablet (0.4 mg total) under the tongue every 5 (five) minutes x 3 doses as needed for chest pain. 25 tablet 3   pantoprazole (PROTONIX) 40 MG tablet Take 1 tablet (40 mg total)  by mouth daily. 90 tablet 2   sotalol (BETAPACE) 80 MG tablet Take 1 tablet (80 mg total) by mouth 2 (two) times daily. 60 tablet 5   Tiotropium Bromide Monohydrate (SPIRIVA RESPIMAT) 2.5 MCG/ACT AERS Inhale 2 puffs into the lungs at bedtime.     valsartan (DIOVAN) 40 MG tablet Take 0.5 tablets (20 mg total) by mouth in the morning. 45 tablet 3   No current facility-administered medications for this visit.    REVIEW OF SYSTEMS (negative unless checked):   Cardiac:  []  Chest pain or chest pressure? []  Shortness of breath upon activity? []  Shortness of breath when lying flat? []  Irregular heart rhythm?  Vascular:  []  Pain in calf, thigh, or hip brought on by walking? []  Pain in feet at night that wakes you up from your sleep? []  Blood clot in your veins? []  Leg swelling?  Pulmonary:  []  Oxygen at home? []   Productive cough? []  Wheezing?  Neurologic:  []  Sudden weakness in arms or legs? []  Sudden numbness in arms or legs? []  Sudden onset of difficult speaking or slurred speech? []  Temporary loss of vision in one eye? []  Problems with dizziness?  Gastrointestinal:  []  Blood in stool? []  Vomited blood?  Genitourinary:  []  Burning when urinating? []  Blood in urine?  Psychiatric:  []  Major depression  Hematologic:  []  Bleeding problems? []  Problems with blood clotting?  Dermatologic:  [x]  Rashes or ulcers?  Constitutional:  []  Fever or chills?  Ear/Nose/Throat:  []  Change in hearing? []  Nose bleeds? []  Sore throat?  Musculoskeletal:  []  Back pain? []  Joint pain? []  Muscle pain?   Physical Examination   Vitals:   12/14/22 1507  BP: 125/73  Pulse: 62  Resp: 20  Temp: 97.9 F (36.6 C)  SpO2: 98%  Weight: 138 lb (62.6 kg)  Height: 5\' 6"  (1.676 m)   Body mass index is 22.27 kg/m.  General:  WDWN in NAD; vital signs documented above Gait: Not observed HENT: WNL, normocephalic Pulmonary: normal non-labored breathing Cardiac: regular Abdomen: soft, NT, no masses Skin: without rashes Vascular Exam/Pulses: Biphasic DP doppler signals bilaterally Extremities: small ulceration to right heel without signs of infection or necrosis. Nearly healed left GT ulcer Musculoskeletal: no muscle wasting or atrophy  Neurologic: A&O X 3;  No focal weakness or paresthesias are detected Psychiatric:  The pt has Normal affect.  Non-Invasive Vascular imaging   ABI (12/14/2022) R:  ABI: 0.64 (0.72),  PT: mono DP: bi TBI:  0.46 L:  ABI: 0.98 (1.12),  PT: mono DP: bi TBI: 0.48  Bilateral Lower Extremity Arterial Duplex (12/14/2022) Right: Patent right limb of aortobifemoral bypass graft.  Patent right lower extremity arterial duplex with three-vessel runoff to the foot.  Left: Patent left femorofemoral bifemoral bypass graft.  Patent left SFA stent without signs of  stenosis.   Medical Decision Making   [name] [name] is a 77 y.o. male who presents for surveillance of PAD  Based on the patient's vascular studies, his ABIs are essentially unchanged in bilateral lower extremities.  His right ABI 0.64 and left ABI 0.98.  He has monophasic flow in bilateral PT arteries and biphasic flow in bilateral DP arteries Arterial duplex of bilateral lower extremities demonstrates patent limbs of aortobifemoral bypass graft.  There is a patent stent in the left SFA without signs of stenosis He denies any rest pain.  He reports continued improved appearance of his left great toe ulceration.  He has developed a new wound on  his right heel over the last month, but this has greatly improved in appearance and is currently under the care of a podiatrist.  This wound is much smaller and more shallow than it used to be.  He denies any signs of infection. Given that his ABIs are unchanged and the wound on his right heel is continuing to improve, we will defer angiography at this time.  I like the patient to follow-up in 1 month with repeat ABIs and bilateral lower extremity arterial duplex study.  I explained to the patient that he may require right lower extremity angiography if this heel wound worsens or does not improve in 1 month.   Loel Dubonnet PA-C [facility] Office: [phone]  Call MD: Myra Gianotti

## 2022-12-20 ENCOUNTER — Ambulatory Visit: Payer: Medicare Other | Attending: Internal Medicine

## 2022-12-20 ENCOUNTER — Telehealth: Payer: Self-pay

## 2022-12-20 DIAGNOSIS — Z95810 Presence of automatic (implantable) cardiac defibrillator: Secondary | ICD-10-CM | POA: Diagnosis not present

## 2022-12-20 DIAGNOSIS — I5022 Chronic systolic (congestive) heart failure: Secondary | ICD-10-CM

## 2022-12-20 NOTE — Telephone Encounter (Signed)
-----   Message from Leonie Man, MD sent at 12/17/2022  3:17 PM EDT ----- Yes -- that would be helpful  DH ----- Message ----- From: Theodoro Parma, RN Sent: 12/14/2022  10:38 AM EDT To: Leonie Man, MD  Hey Dr. Ellyn Hack,  I hadn't seen a reply so I just wanted to follow-up and see if you'd like me to schedule this patient with Structural Heart for a consult.   Thank you!  Valetta Fuller  ----- Message ----- From: Sherren Mocha, MD Sent: 11/21/2022   5:18 PM EDT To: Leonie Man, MD; Theodoro Parma, RN  MR looks severe due to inferior infarct and posterior leaflet restriction. Likely good anatomy for TEER with Clip or Pascal device. Happy to see if you would like.  ----- Message ----- From: Leonie Man, MD Sent: 11/21/2022   4:19 PM EST To: Sherren Mocha, MD  Ronalee Belts -imagers of keeping the MR-Clip program robust, but I have asked you to take a look at this gentlemen's MR.  He seems to doing pretty well from a symptomatic standpoint, but I was surprised to see this much MR-looks like tethering, not sure if Clip is an option.  [place]

## 2022-12-20 NOTE — Telephone Encounter (Signed)
Left message to call back  

## 2022-12-21 ENCOUNTER — Other Ambulatory Visit: Payer: Self-pay

## 2022-12-21 DIAGNOSIS — I779 Disorder of arteries and arterioles, unspecified: Secondary | ICD-10-CM

## 2022-12-21 DIAGNOSIS — I70229 Atherosclerosis of native arteries of extremities with rest pain, unspecified extremity: Secondary | ICD-10-CM

## 2022-12-21 NOTE — Telephone Encounter (Signed)
Scheduled the patient for MR consult with Dr. Burt Knack 01/24/23. The patient's wife (DPR) was grateful for assistance.

## 2022-12-21 NOTE — Progress Notes (Signed)
EPIC Encounter for ICM Monitoring  Patient Name: [name] [name] is a 77 y.o. male Date: 12/21/2022 Primary Care Physican: John Giovanni, MD Primary Cardiologist: Ellyn Hack Electrophysiologist: Santina Evans Pacing: 88%     08/31/2022 Weight: 127 lbs 10/13/2022 Weight: 130 lbs   AT/AF Burden 13%     Transmission results reviewed.    [place] thoracic impedance suggesting normal fluid levels.   Prescribed:  Furosemide 20 mg Take 0.5 tablets (10 mg total) by mouth daily. May take additional dose as needed for worsening swelling.   Aspirin 81 mg daily   Labs: 11/20/2021 Creatinine 0.86, BUN 8,   Potassium 5.2, Sodium 131, GFR 91 11/11/2021 Creatinine 0.88, BUN 10, Potassium 5.5, Sodium 128, GFR 91 10/07/2021 Creatinine 0.86, BUN 8,   Potassium 5.3, Sodium 131, GFR 91 A complete set of results can be found in Results Review.   Recommendations:  No changes.    Follow-up plan: ICM clinic phone appointment on 01/24/2023.   91 day device clinic remote transmission 01/14/2023.     EP/Cardiology Office Visits:  01/24/2023 with Dr Burt Knack.   Recall 05/22/2023 with Dr Lovena Le.   Copy of ICM check sent to Dr. Lovena Le.    3 month ICM trend: 12/20/2022.    Rosalene Billings, RN 12/21/2022 4:30 PM

## 2023-01-14 ENCOUNTER — Ambulatory Visit (INDEPENDENT_AMBULATORY_CARE_PROVIDER_SITE_OTHER): Payer: Medicare Other

## 2023-01-14 DIAGNOSIS — I472 Ventricular tachycardia, unspecified: Secondary | ICD-10-CM

## 2023-01-14 LAB — CUP PACEART REMOTE DEVICE CHECK
Battery Remaining Longevity: 20 mo
Battery Remaining Percentage: 23 %
Battery Voltage: 2.8 V
Brady Statistic AP VP Percent: 3.4 %
Brady Statistic AP VS Percent: 1 %
Brady Statistic AS VP Percent: 84 %
Brady Statistic AS VS Percent: 2.8 %
Brady Statistic RA Percent Paced: 1.5 %
Date Time Interrogation Session: 20240426020017
HighPow Impedance: 46 Ohm
HighPow Impedance: 46 Ohm
Implantable Lead Connection Status: 753985
Implantable Lead Connection Status: 753985
Implantable Lead Connection Status: 753985
Implantable Lead Implant Date: 20171215
Implantable Lead Implant Date: 20171215
Implantable Lead Implant Date: 20171215
Implantable Lead Location: 753858
Implantable Lead Location: 753859
Implantable Lead Location: 753860
Implantable Lead Model: 7122
Implantable Pulse Generator Implant Date: 20171215
Lead Channel Impedance Value: 330 Ohm
Lead Channel Impedance Value: 390 Ohm
Lead Channel Impedance Value: 410 Ohm
Lead Channel Pacing Threshold Amplitude: 0.625 V
Lead Channel Pacing Threshold Amplitude: 0.75 V
Lead Channel Pacing Threshold Amplitude: 0.875 V
Lead Channel Pacing Threshold Pulse Width: 0.5 ms
Lead Channel Pacing Threshold Pulse Width: 0.5 ms
Lead Channel Pacing Threshold Pulse Width: 0.5 ms
Lead Channel Sensing Intrinsic Amplitude: 1.2 mV
Lead Channel Sensing Intrinsic Amplitude: 11.7 mV
Lead Channel Setting Pacing Amplitude: 2 V
Lead Channel Setting Pacing Amplitude: 2 V
Lead Channel Setting Pacing Amplitude: 2 V
Lead Channel Setting Pacing Pulse Width: 0.5 ms
Lead Channel Setting Pacing Pulse Width: 0.5 ms
Lead Channel Setting Sensing Sensitivity: 0.5 mV
Pulse Gen Serial Number: 7368976

## 2023-01-17 ENCOUNTER — Other Ambulatory Visit: Payer: Self-pay | Admitting: *Deleted

## 2023-01-17 DIAGNOSIS — I779 Disorder of arteries and arterioles, unspecified: Secondary | ICD-10-CM

## 2023-01-17 DIAGNOSIS — I70229 Atherosclerosis of native arteries of extremities with rest pain, unspecified extremity: Secondary | ICD-10-CM

## 2023-01-18 ENCOUNTER — Ambulatory Visit (INDEPENDENT_AMBULATORY_CARE_PROVIDER_SITE_OTHER): Payer: Medicare Other | Admitting: Physician Assistant

## 2023-01-18 ENCOUNTER — Ambulatory Visit (HOSPITAL_COMMUNITY)
Admission: RE | Admit: 2023-01-18 | Discharge: 2023-01-18 | Disposition: A | Discharge status: Home / Self Care | Payer: Medicare Other | Source: Ambulatory Visit | Attending: Vascular Surgery | Admitting: Vascular Surgery

## 2023-01-18 ENCOUNTER — Ambulatory Visit (INDEPENDENT_AMBULATORY_CARE_PROVIDER_SITE_OTHER)
Admission: RE | Admit: 2023-01-18 | Discharge: 2023-01-18 | Disposition: A | Discharge status: Home / Self Care | Payer: Medicare Other | Source: Ambulatory Visit | Attending: Vascular Surgery | Admitting: Vascular Surgery

## 2023-01-18 VITALS — BP 134/74 | HR 66 | Temp 97.6°F | Resp 20 | Ht 66.0 in

## 2023-01-18 DIAGNOSIS — I779 Disorder of arteries and arterioles, unspecified: Secondary | ICD-10-CM | POA: Insufficient documentation

## 2023-01-18 DIAGNOSIS — I70229 Atherosclerosis of native arteries of extremities with rest pain, unspecified extremity: Secondary | ICD-10-CM

## 2023-01-18 DIAGNOSIS — I70221 Atherosclerosis of native arteries of extremities with rest pain, right leg: Secondary | ICD-10-CM | POA: Diagnosis not present

## 2023-01-18 LAB — VAS US ABI WITH/WO TBI
Left ABI: 0.89
Right ABI: 0.63

## 2023-01-18 NOTE — Progress Notes (Unsigned)
Office Note     CC:  follow up Requesting Provider:  Dellia Cloud, MD  HPI: [name] [name] is a 77 y.o. (29-Aug-1946) male who presents for follow up of PAD.   He has a remote history of aortobifemoral bypass done at the [place] in 2014.  Most recently on 05/13/2021 he required left common femoral and SFA endarterectomy, revision of aortobifem left limb, and left SFA stent placement by Dr. Chestine Spore.  This was done for tissue loss on the left great toe.   At his last visit 1 month ago his left great toe was essentially healed. He did developed a right heel wound. This has been under management by his Podiatrist. It was however heeling. He otherwise was without any rest pain or claudication. His duplex showed patent limbs of aortobifemoral bypass and patent left SFA stent. His ABIs were also stable.   He is here today with his wife and daughter to follow up on his wounds. Left great toe ulcer remains healed. Right heel wound is improving. He has follow up this week with Podiatrist at the [place]. He has neuropathy in his feet but this is unchanged. This is secondary to [employer] rot from his time in the [place] War. He also is troubled with athletes foot and very dry skin causing skin breakage that has then lead to his wounds.  His wife and daughter along with Podiatrist have been trying to combat the two issues. They also have some concern of weakness and unsteadiness on ambulation mostly in is right leg. He does not do much walking except around the house. He otherwise uses electric wheel chair.  He had been using a seated bike peddler, but he says it made his legs feel so good that he was using it for several hours a day and ended up developing a sacral wound from sitting so he has recently stopped. He is otherwise without any rest pain or claudication. He is taking his Plavix , Aspirin and statin.  Past Medical History:  Diagnosis Date   Acid reflux    AICD (automatic cardioverter/defibrillator)  present    a. 08/2016 St. Jude (serial Number [id number]) biventricular ICD.   Anemia    Chronic systolic CHF (congestive heart failure) (HCC)    a. 08/2016 Echo: EF 20%, diffuse HK, inf, post AK, mod-sev MR, sev dil LA, mod TR, PASP .   Complication of anesthesia    "was told I responded well to anesthesia"   COPD (chronic obstructive pulmonary disease) (HCC)    Coronary artery disease involving native coronary artery of native heart with angina pectoris (HCC)    a. 10/2012 MI after aortobifem bypass, found to have multivessel CAD -->CABG x3;  b. 08/2016 Cath: LM 60, LAD 56m, LCX 60ost/p, RCA 100p, VG->RPDA 50p, VG->OM1 ok, LIMA->LAD ok, EF 25%.   Essential hypertension 03/03/2012   History of blood transfusion 11/2012   "during his CABG"   History of gout X 1   History of stomach ulcers 1970s   Hyperlipidemia associated with type 2 diabetes mellitus (HCC) 09/16/2011   Ischemic cardiomyopathy    a. 08/2016 Echo: EF 20%, diffuse HK, inf, post AK;  b. 08/2016 St. Jude (serial Number [id number]) biventricular ICD.   Myocardial infarction [facility])    PAD (peripheral artery disease) (HCC)    s/p aortobifemoral bypass 10/2012   Peripheral neuropathy    PTSD (post-traumatic stress disorder)    Type II diabetes mellitus (HCC)    Ventricular tachycardia (  HCC)    a. 08/2016 St. Jude (serial Number [id number]) biventricular ICD-->oral amio added.   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 Is now on chronic suppressive antibiotics with dicloxacillin    Past Surgical History:  Procedure Laterality Date   ABDOMINAL AORTOGRAM W/LOWER EXTREMITY Left 05/07/2021   Procedure: ABDOMINAL AORTOGRAM W/LOWER EXTREMITY;  Surgeon: [name], MD;  Location: [facility] INVASIVE CV LAB;  Service: Cardiovascular;  Laterality: Left;   AORTO-FEMORAL BYPASS GRAFT Bilateral 10/2012   [facility]    CARDIAC CATHETERIZATION  04/03/2013   [facility] [place] (? for abnormal Nuclear ST) --> no PCI, by report, patent grafts.   CARDIAC CATHETERIZATION N/A 09/02/2016   Procedure: Left Heart Cath and Cors/Grafts Angiography;  Surgeon: Kathleene Hazel, MD;  Location: [facility] INVASIVE CV LAB:: Ost LM 60% -> mid LAD 99% subCTO, ost-prox LCx 60%; prox RCA 100% CTO w/ (small) SVG->RPDA prox 50%; SVG->OM1 ok, LIMA->mLAD ok, EF 25% - complicated by VT during access - Shock x 2, Lidocaine & Amiodarone   CARDIAC CATHETERIZATION  11/09/2012   [facility]: due to Sustained VT post-Ob Ao-BiFem Bypass. (no Angina)   CHOLECYSTECTOMY  03/05/2012   Procedure: LAPAROSCOPIC CHOLECYSTECTOMY WITH INTRAOPERATIVE CHOLANGIOGRAM;  Surgeon: Emelia Loron, MD;  Location: [facility] OR;  Service: General;  Laterality: N/A;   CORONARY ARTERY BYPASS GRAFT  11/2012   [facility]: CABG X 3 -> LIMA-mLAD, SVG-highOM1, SVG-RPDA (dRCA).   ENDARTERECTOMY FEMORAL Left 05/13/2021   Procedure: Left Femoral Endarterectomy WITH BOVINE PATCH PROFUNDOPLASTY, REVISION OF LEFT LIMB OF AORTO-BIFEMORAL GRAFT WITH DACRON GRAFT;  Surgeon: Cephus Shelling, MD;  Location: [facility] OR;  Service: Vascular;  Laterality: Left;   EP IMPLANTABLE DEVICE N/A 09/03/2016   Procedure: BI-VENTRICULAR IMPLANTABLE CARDIOVERTER DEFIBRILLATOR  (CRT-D);  Surgeon: Marinus Maw, MD;  Location: [facility] INVASIVE CV LAB;  Service: Cardiovascular;  Laterality: N/A;   INGUINAL HERNIA REPAIR Left 1990s   INSERTION OF ILIAC STENT Left 05/13/2021   Procedure: INSERTION OF SUPERFICIAL FEMORAL ARTERY STENT TIMES THREE AND ANGIOPLASTY;  Surgeon: Cephus Shelling, MD;  Location: [facility] OR;  Service: Vascular;  Laterality: Left;   INTRAOPERATIVE ARTERIOGRAM Left 05/13/2021   Procedure: INTRA OPERATIVE ARTERIOGRAM OF LEFT LOWER EXTREMITY;  Surgeon: Cephus Shelling, MD;  Location: [facility] OR;  Service: Vascular;  Laterality: Left;   NM MYOVIEW LTD  10/05/2022   EF ~28%.  Severely reduced function.   High risk due to decreased EF.  Large fixed severe defect in the mid to basal inferior and inferolateral wall with associated wall motion abnormality consistent with prior infarction.  No evidence of reversible defect/ischemia. =? Echo EF 40-45% with mid to apical anterior/inferolateral akinesis and inferior akinesis.   STERNOTOMY  09/2013   [facility] [place]): Sternal Wound Infection -> re-do sternotomy with metal place "sheild" placed. -Has chronic staph infection, on chronic suppressive medication   TRANSTHORACIC ECHOCARDIOGRAM  10/05/2022   EF 40 to 45%.  Basal to mid anterolateral and inferolateral akinesis with basal inferior akinesis.  GR 2 DD.  Severely elevated PAP with mild RV dilation.  RV P estimated 67 mmHg).  Severe LA dilation.  Mild to moderate RA dilation.  Mild to moderate TR.  Severe MR likely with restricted posterior leaflet and inferolateral wall motion.  Moderate AOV calcification with no stenosis.  Mild AI.   TRANSTHORACIC ECHOCARDIOGRAM  01/2021   EF 25 to 30%.  Severely depressed EF.  Global HK with worst basal to mid anterolateral and inferolateral as well as anterior basal inferior walls.  GR 1 DD.  Mildly reduced RV function.  Functionally bicuspid aortic valve with moderate calcification but no stenosis.  Overall slightly improved EF.   UMBILICAL HERNIA REPAIR  03/05/2012   Procedure: HERNIA REPAIR UMBILICAL ADULT;  Surgeon: Emelia Loron, MD;  Location[facility] OR;  Service: General;  Laterality: N/A;    Social History   Socioeconomic History   Marital status: Married    Spouse name: Not on file   Number of children: Not on file   Years of education: Not on file   Highest education level: Not on file  Occupational History   Not on file  Tobacco Use   Smoking status: Every Day    Types: Pipe    Passive exposure: Never   Smokeless tobacco: Former    Types: Snuff, Chew   Tobacco comments:    09/03/2016 "used smokeless tobacco in my teens; quit smoking cigarettes in the  early 1980s; smokes pipe only"  Vaping Use   Vaping Use: Never used  Substance and Sexual Activity   Alcohol use: No   Drug use: No   Sexual activity: Not on file  Other Topics Concern   Not on file  Social History Narrative   Routine activity around the house, but is limited more by musculoskeletal leg pain and weakness.      He enjoys smoking a pipe-long ago, he quit cigarettes   Social Determinants of Health   Financial Resource Strain: Not on file  Food Insecurity: Not on file  Transportation Needs: Not on file  Physical Activity: Not on file  Stress: Not on file  Social Connections: Not on file  Intimate Partner Violence: Not on file    Family History  Problem Relation Age of Onset   Cancer Brother        esophageal    Current Outpatient Medications  Medication Sig Dispense Refill   albuterol (VENTOLIN HFA) 108 (90 Base) MCG/ACT inhaler Inhale 1-2 puffs into the lungs every 6 (six) hours as needed for wheezing or shortness of breath.     Alpha-Lipoic Acid 600 MG TABS Take 600 mg by mouth daily.     aspirin EC 81 MG tablet Take 81 mg by mouth daily.     atorvastatin (LIPITOR) 80 MG tablet Take 40 mg by mouth every evening.     carvedilol (COREG) 3.125 MG tablet Take 1 tablet (3.125 mg total) by mouth 2 (two) times daily. 180 tablet 3   cholecalciferol (VITAMIN D) 25 MCG (1000 UNIT) tablet Take 2,000 Units by mouth every evening.     clopidogrel (PLAVIX) 75 MG tablet Take 1 tablet (75 mg total) by mouth daily at 6 (six) AM. 30 tablet 2   dicloxacillin (DYNAPEN) 250 MG capsule Take 500 mg by mouth 3 (three) times daily.     fluticasone (FLONASE) 50 MCG/ACT nasal spray Place 2 sprays into both nostrils daily as needed for allergies or rhinitis.     furosemide (LASIX) 20 MG tablet Take 0.5 tablets (10 mg total) by mouth daily. May take additional dose as needed for worsening swelling (Patient taking differently: Take 20 mg by mouth daily. May take additional dose as needed  for worsening swelling) 60 tablet 3   glipiZIDE (GLUCOTROL) 5 MG tablet Take 5 mg by mouth 2 (two) times daily before a meal.     lidocaine (LIDODERM) 5 % Place onto the skin.  magnesium oxide (MAG-OX) 400 (241.3 Mg) MG tablet Take 1 tablet (400 mg total) by mouth daily. 30 tablet 6   metFORMIN (GLUCOPHAGE) 1000 MG tablet Take 1 tablet (1,000 mg total) by mouth 2 (two) times daily with a meal. Resume on 09/06/2016     naproxen (NAPROSYN) 500 MG tablet Take 500 mg by mouth as needed for moderate pain or mild pain.     nitroGLYCERIN (NITROSTAT) 0.4 MG SL tablet Place 1 tablet (0.4 mg total) under the tongue every 5 (five) minutes x 3 doses as needed for chest pain. 25 tablet 3   pantoprazole (PROTONIX) 40 MG tablet Take 1 tablet (40 mg total) by mouth daily. 90 tablet 2   sotalol (BETAPACE) 80 MG tablet Take 1 tablet (80 mg total) by mouth 2 (two) times daily. 60 tablet 5   Tiotropium Bromide Monohydrate (SPIRIVA RESPIMAT) 2.5 MCG/ACT AERS Inhale 2 puffs into the lungs at bedtime.     valsartan (DIOVAN) 40 MG tablet Take 0.5 tablets (20 mg total) by mouth in the morning. 45 tablet 3   No current facility-administered medications for this visit.    Allergies  Allergen Reactions   Lisinopril     Other reaction(s): Cough     REVIEW OF SYSTEMS:  [X]  denotes positive finding, [ ]  denotes negative finding Cardiac  Comments:  Chest pain or chest pressure:    Shortness of breath upon exertion:    Short of breath when lying flat:    Irregular heart rhythm:        Vascular    Pain in calf, thigh, or hip brought on by ambulation:    Pain in feet at night that wakes you up from your sleep:     Blood clot in your veins:    Leg swelling:         Pulmonary    Oxygen at home:    Productive cough:     Wheezing:         Neurologic    Sudden weakness in arms or legs:     Sudden numbness in arms or legs:     Sudden onset of difficulty speaking or slurred speech:    Temporary loss of vision  in one eye:     Problems with dizziness:         Gastrointestinal    Blood in stool:     Vomited blood:         Genitourinary    Burning when urinating:     Blood in urine:        Psychiatric    Major depression:         Hematologic    Bleeding problems:    Problems with blood clotting too easily:        Skin    Rashes or ulcers:        Constitutional    Fever or chills:      PHYSICAL EXAMINATION:  Vitals:   01/18/23 1342  BP: 134/74  Pulse: 66  Resp: 20  Temp: 97.6 F (36.4 C)  TempSrc: Temporal  Height: 5\' 6"  (1.676 m)    General:  WDWN in NAD; vital signs documented above Gait: Not observed, in wheelchair HENT: WNL, normocephalic Pulmonary: normal non-labored breathing , without Rales, rhonchi,  wheezing Cardiac: regular HR Abdomen: soft, NT, no masses Skin: with very dry scaly skin of bilateral lower legs and feet Vascular Exam/Pulses: 2+ femoral pulses, no pulses palpable distally Extremities: without ischemic changes, without Gangrene ,  without cellulitis; very small ulceration on right lateral heel .5cm with some fibrinous tissue in wound bed. No signs of infection. No drainage. Left great toe ulceration healed. Some scaly skin overlying. Musculoskeletal: no muscle wasting or atrophy  Neurologic: A&O X 3 Psychiatric:  The pt has Normal affect.   Non-Invasive Vascular Imaging:    +-------+-----------+-----------+------------+------------+  ABI/TBIToday's ABIToday's TBIPrevious ABIPrevious TBI  +-------+-----------+-----------+------------+------------+  Right 0.63       0          0.64        0.46          +-------+-----------+-----------+------------+------------+  Left  0.89       0          0.98        0.48          +-------+-----------+-----------+------------+------------+  Monophasic waveforms bilaterally. Absent toe pressures bilaterally   +-----------+--------+-----+--------+----------+--------+  RIGHT     PSV  cm/sRatioStenosisWaveform  Comments  +-----------+--------+-----+--------+----------+--------+  CFA Distal 41                   triphasic           +-----------+--------+-----+--------+----------+--------+  DFA       95                   biphasic            +-----------+--------+-----+--------+----------+--------+  SFA Prox   148                  biphasic            +-----------+--------+-----+--------+----------+--------+  SFA Mid    55                   biphasic            +-----------+--------+-----+--------+----------+--------+  SFA Distal 45                   biphasic            +-----------+--------+-----+--------+----------+--------+  POP Prox   31                   biphasic            +-----------+--------+-----+--------+----------+--------+  POP Distal 39                   biphasic            +-----------+--------+-----+--------+----------+--------+  ATA Distal 21                   monophasic          +-----------+--------+-----+--------+----------+--------+  PTA Distal 18                   monophasic          +-----------+--------+-----+--------+----------+--------+  PERO Distal21                   monophasic          +-----------+--------+-----+--------+----------+--------+   ASSESSMENT/PLAN:: 77 y.o. male here for follow up for follow up of PAD. He has a remote history of aortobifemoral bypass done at the [place] in 2014.  Most recently on 05/13/2021 he required left common femoral and SFA endarterectomy, revision of aortobifem left limb, and left SFA stent placement by Dr. Chestine Spore.  This was done for tissue loss on the left great toe. The left great toe has since healed. He had subsequently developed small  right heel ulceration. This is healing well with local wound care. Under management of Podiatrist at the [place].  - ABIs today are overall stable. TBIs absent bilaterally which is decreased from 1 month ago. 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 He has rather low velocities in his tibial vessels but no areas of significant stenosis.  - No vascular intervention indicated at this time. Recommend local wound care. Would not advise any aggressive debridement of foot ulceration as he is high risk for poor wound healing - Keep feet protected - Mobilize as able - Okay to use seated foot peddler but advised him to only two 30 minutes or less in one sitting and maybe only 1-2 sessions per day - May also benefit from getting a donut to sit on and rotate position to avoid constant pressure on his sacral wound  - I recommend he follow up in 6-8 weeks with Left lower extremity arterial duplex and ABI   Graceann Congress, PA-C [facility] [phone]  Clinic MD:   Steve Rattler

## 2023-01-19 ENCOUNTER — Encounter: Payer: Self-pay | Admitting: Physician Assistant

## 2023-01-24 ENCOUNTER — Encounter: Payer: Self-pay | Admitting: Cardiovascular Disease

## 2023-01-24 ENCOUNTER — Ambulatory Visit: Payer: Medicare Other | Attending: Cardiovascular Disease | Admitting: Cardiovascular Disease

## 2023-01-24 ENCOUNTER — Ambulatory Visit (INDEPENDENT_AMBULATORY_CARE_PROVIDER_SITE_OTHER): Payer: Medicare Other

## 2023-01-24 VITALS — BP 132/70 | HR 56 | Ht 66.0 in | Wt 127.2 lb

## 2023-01-24 DIAGNOSIS — Z95810 Presence of automatic (implantable) cardiac defibrillator: Secondary | ICD-10-CM

## 2023-01-24 DIAGNOSIS — I5022 Chronic systolic (congestive) heart failure: Secondary | ICD-10-CM | POA: Diagnosis not present

## 2023-01-24 DIAGNOSIS — I340 Nonrheumatic mitral (valve) insufficiency: Secondary | ICD-10-CM | POA: Insufficient documentation

## 2023-01-24 MED ORDER — DICLOXACILLIN SODIUM 250 MG PO CAPS: 500.0000 mg | ORAL_CAPSULE | Freq: Three times a day (TID) | ORAL | 0 refills | Status: DC

## 2023-01-24 NOTE — Progress Notes (Signed)
Cardiology Office Note:    Date:  01/24/2023   ID:  TEREK DEBAR, DOB [birth year], MRN [id number]  PCP:  Dellia Cloud, MD   [facility] Providers Cardiologist:  Bryan Lemma, MD Electrophysiologist:  Lewayne Bunting, MD     Referring MD: Dellia Cloud, MD   Chief Complaint  Patient presents with   Mitral Regurgitation    History of Present Illness:    [name] [name] is a 77 y.o. male referred for evaluation of mitral regurgitation by Dr. Herbie Baltimore.  The patient is here with his wife and daughter today.  The patient has a lot of cardiovascular problems with a pretty complex history.  His biggest problem lately has been profound leg weakness.  He is able to ambulate short distances in the house but has problems with balance, foot ulcerations, and leg weakness.  He is in a wheelchair today.  He has severe ischemic mitral regurgitation and is referred to discuss potential treatment options.  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  His last heart catheterization was performed in 2017.  He required CRT-D for severe cardiomyopathy, LVEF less than 35%, and episodes of nonsustained VT.  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  On old echo studies, the patient was noted to have severe LV dysfunction with only mild mitral regurgitation.  However, the patient has been noted to have an increase in his heart murmur.  A recent echocardiogram from January of this year demonstrated marked tethering of the posterior leaflet of the mitral valve with severe eccentric mitral regurgitation associated with this.  The patient was having problems with heart failure and some of this was related to arrhythmia.  He is now treated with sotalol and his symptoms are much better.  He actually denies any chest pain,  chest pressure, or shortness of breath at present.  He has been followed closely by vascular surgery.  Recent ABIs show monophasic waveforms bilaterally with moderate reduction in arterial flow on the right with an ABI of about 0.6 and mild reduction on the left with an ABI of 0.89.  Past Medical History:  Diagnosis Date   Acid reflux    AICD (automatic cardioverter/defibrillator) present    a. 08/2016 St. Jude (serial Number [id number]) biventricular ICD.   Anemia    Chronic systolic CHF (congestive heart failure) (HCC)    a. 08/2016 Echo: EF 20%, diffuse HK, inf, post AK, mod-sev MR, sev dil LA, mod TR, PASP .   Complication of anesthesia    "was told I responded well to anesthesia"   COPD (chronic obstructive pulmonary disease) (HCC)    Coronary artery disease involving native coronary artery of native heart with angina pectoris (HCC)    a. 10/2012 MI after aortobifem bypass, found to have multivessel CAD -->CABG x3;  b. 08/2016 Cath: LM 60, LAD 47m, LCX 60ost/p, RCA 100p, VG->RPDA 50p, VG->OM1 ok, LIMA->LAD ok, EF 25%.   Essential hypertension 03/03/2012   History of blood transfusion 11/2012   "during his CABG"   History of gout X 1   History of stomach ulcers 1970s   Hyperlipidemia associated with type 2 diabetes mellitus (HCC) 09/16/2011   Ischemic cardiomyopathy    a. 08/2016 Echo: EF 20%, diffuse HK, inf, post AK;  b. 08/2016 St. Jude (serial Number [id number]) biventricular ICD.   Myocardial infarction (HCC)  PAD (peripheral artery disease) (HCC)    s/p aortobifemoral bypass 10/2012   Peripheral neuropathy    PTSD (post-traumatic stress disorder)    Type II diabetes mellitus (HCC)    Ventricular tachycardia (HCC)    a. 08/2016 St. Jude (serial Number [id number]) biventricular ICD-->oral amio added.   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 Is now on  chronic suppressive antibiotics with dicloxacillin    Past Surgical History:  Procedure Laterality Date   ABDOMINAL AORTOGRAM W/LOWER EXTREMITY Left 05/07/2021   Procedure: ABDOMINAL AORTOGRAM W/LOWER EXTREMITY;  Surgeon: [name], MD;  Location: [facility] INVASIVE CV LAB;  Service: Cardiovascular;  Laterality: Left;   AORTO-FEMORAL BYPASS GRAFT Bilateral 10/2012   [facility]   CARDIAC CATHETERIZATION  04/03/2013   [place] (? for abnormal Nuclear ST) --> no PCI, by report, patent grafts.   CARDIAC CATHETERIZATION N/A 09/02/2016   Procedure: Left Heart Cath and Cors/Grafts Angiography;  Surgeon: Kathleene Hazel, MD;  Location: [facility] INVASIVE CV LAB:: Ost LM 60% -> mid LAD 99% subCTO, ost-prox LCx 60%; prox RCA 100% CTO w/ (small) SVG->RPDA prox 50%; SVG->OM1 ok, LIMA->mLAD ok, EF 25% - complicated by VT during access - Shock x 2, Lidocaine & Amiodarone   CARDIAC CATHETERIZATION  11/09/2012   [facility]: due to Sustained VT post-Ob Ao-BiFem Bypass. (no Angina)   CHOLECYSTECTOMY  03/05/2012   Procedure: LAPAROSCOPIC CHOLECYSTECTOMY WITH INTRAOPERATIVE CHOLANGIOGRAM;  Surgeon: Emelia Loron, MD;  Location: [facility] OR;  Service: General;  Laterality: N/A;   CORONARY ARTERY BYPASS GRAFT  11/2012   [facility]: CABG X 3 -> LIMA-mLAD, SVG-highOM1, SVG-RPDA (dRCA).   ENDARTERECTOMY FEMORAL Left 05/13/2021   Procedure: Left Femoral Endarterectomy WITH BOVINE PATCH PROFUNDOPLASTY, REVISION OF LEFT LIMB OF AORTO-BIFEMORAL GRAFT WITH DACRON GRAFT;  Surgeon: Cephus Shelling, MD;  Location: [facility] OR;  Service: Vascular;  Laterality: Left;   EP IMPLANTABLE DEVICE N/A 09/03/2016   Procedure: BI-VENTRICULAR IMPLANTABLE CARDIOVERTER DEFIBRILLATOR  (CRT-D);  Surgeon: Marinus Maw, MD;  Location[facility] INVASIVE CV LAB;  Service: Cardiovascular;  Laterality: N/A;   INGUINAL HERNIA REPAIR Left 1990s   INSERTION OF ILIAC STENT Left 05/13/2021   Procedure: INSERTION OF SUPERFICIAL FEMORAL ARTERY STENT TIMES THREE  AND ANGIOPLASTY;  Surgeon: Cephus Shelling, MD;  Location: [facility] OR;  Service: Vascular;  Laterality: Left;   INTRAOPERATIVE ARTERIOGRAM Left 05/13/2021   Procedure: INTRA OPERATIVE ARTERIOGRAM OF LEFT LOWER EXTREMITY;  Surgeon: Cephus Shelling, MD;  Location: [facility] OR;  Service: Vascular;  Laterality: Left;   NM MYOVIEW LTD  10/05/2022   EF ~28%.  Severely reduced function.  High risk due to decreased EF.  Large fixed severe defect in the mid to basal inferior and inferolateral wall with associated wall motion abnormality consistent with prior infarction.  No evidence of reversible defect/ischemia. =? Echo EF 40-45% with mid to apical anterior/inferolateral akinesis and inferior akinesis.   STERNOTOMY  09/2013   [facility] [place]): Sternal Wound Infection -> re-do sternotomy with metal place "sheild" placed. -Has chronic staph infection, on chronic suppressive medication   TRANSTHORACIC ECHOCARDIOGRAM  10/05/2022   EF 40 to 45%.  Basal to mid anterolateral and inferolateral akinesis with basal inferior akinesis.  GR 2 DD.  Severely elevated PAP with mild RV dilation.  RV P estimated 67 mmHg).  Severe LA dilation.  Mild to moderate RA dilation.  Mild to moderate TR.  Severe MR  likely with restricted posterior leaflet and inferolateral wall motion.  Moderate AOV calcification with no stenosis.  Mild AI.   TRANSTHORACIC ECHOCARDIOGRAM  01/2021   EF 25 to 30%.  Severely depressed EF.  Global HK with worst basal to mid anterolateral and inferolateral as well as anterior basal inferior walls.  GR 1 DD.  Mildly reduced RV function.  Functionally bicuspid aortic valve with moderate calcification but no stenosis.  Overall slightly improved EF.   UMBILICAL HERNIA REPAIR  03/05/2012   Procedure: HERNIA REPAIR UMBILICAL ADULT;  Surgeon: Emelia Loron, MD;  Location: [facility] OR;  Service: General;  Laterality: N/A;    Current Medications: Current Meds  Medication Sig   albuterol (VENTOLIN HFA) 108 (90 Base)  MCG/ACT inhaler Inhale 1-2 puffs into the lungs every 6 (six) hours as needed for wheezing or shortness of breath.   Alpha-Lipoic Acid 600 MG TABS Take 600 mg by mouth daily.   aspirin EC 81 MG tablet Take 81 mg by mouth daily.   atorvastatin (LIPITOR) 80 MG tablet Take 40 mg by mouth every evening.   carvedilol (COREG) 3.125 MG tablet Take 1 tablet (3.125 mg total) by mouth 2 (two) times daily.   cholecalciferol (VITAMIN D) 25 MCG (1000 UNIT) tablet Take 2,000 Units by mouth every evening.   clopidogrel (PLAVIX) 75 MG tablet Take 1 tablet (75 mg total) by mouth daily at 6 (six) AM.   dicloxacillin (DYNAPEN) 250 MG capsule Take 500 mg by mouth 3 (three) times daily.   fluticasone (FLONASE) 50 MCG/ACT nasal spray Place 2 sprays into both nostrils daily as needed for allergies or rhinitis.   furosemide (LASIX) 20 MG tablet Take 20 mg by mouth.   glipiZIDE (GLUCOTROL) 5 MG tablet Take 5 mg by mouth 2 (two) times daily before a meal.   lidocaine (LIDODERM) 5 % Place onto the skin.   magnesium oxide (MAG-OX) 400 (241.3 Mg) MG tablet Take 1 tablet (400 mg total) by mouth daily.   metFORMIN (GLUCOPHAGE) 1000 MG tablet Take 1 tablet (1,000 mg total) by mouth 2 (two) times daily with a meal. Resume on 09/06/2016   naproxen (NAPROSYN) 500 MG tablet Take 500 mg by mouth as needed for moderate pain or mild pain.   nitroGLYCERIN (NITROSTAT) 0.4 MG SL tablet Place 1 tablet (0.4 mg total) under the tongue every 5 (five) minutes x 3 doses as needed for chest pain.   pantoprazole (PROTONIX) 40 MG tablet Take 1 tablet (40 mg total) by mouth daily.   sotalol (BETAPACE) 80 MG tablet Take 1 tablet (80 mg total) by mouth 2 (two) times daily.   Tiotropium Bromide Monohydrate (SPIRIVA RESPIMAT) 2.5 MCG/ACT AERS Inhale 2 puffs into the lungs at bedtime.   valsartan (DIOVAN) 40 MG tablet Take 0.5 tablets (20 mg total) by mouth in the morning.     Allergies:   Lisinopril   Social History   Socioeconomic History    Marital status: Married    Spouse name: Not on file   Number of children: Not on file   Years of education: Not on file   Highest education level: Not on file  Occupational History   Not on file  Tobacco Use   Smoking status: Every Day    Types: Pipe    Passive exposure: Never   Smokeless tobacco: Former    Types: Snuff, Chew   Tobacco comments:    09/03/2016 "used smokeless tobacco in my teens; quit smoking cigarettes in the early 1980s; smokes pipe  only"  Vaping Use   Vaping Use: Never used  Substance and Sexual Activity   Alcohol use: No   Drug use: No   Sexual activity: Not on file  Other Topics Concern   Not on file  Social History Narrative   Routine activity around the house, but is limited more by musculoskeletal leg pain and weakness.      He enjoys smoking a pipe-long ago, he quit cigarettes   Social Determinants of Health   Financial Resource Strain: Not on file  Food Insecurity: Not on file  Transportation Needs: Not on file  Physical Activity: Not on file  Stress: Not on file  Social Connections: Not on file     Family History: The patient's family history includes Cancer in his brother.  ROS:   Please see the history of present illness.    Positive for weakness, leg weakness and instability, poor appetite.  All other systems reviewed and are negative.  EKGs/Labs/Other Studies Reviewed:    The following studies were reviewed today: Cardiac Studies & Procedures   CARDIAC CATHETERIZATION  CARDIAC CATHETERIZATION 09/02/2016  Narrative  There is severe left ventricular systolic dysfunction.  LV end diastolic pressure is severely elevated.  The left ventricular ejection fraction is less than 25% by visual estimate.  There is no mitral valve regurgitation.  Prox RCA lesion, 100 %stenosed.  SVG graft was visualized by angiography.  Origin to Prox Graft lesion, 50 %stenosed.  Ost Cx to Prox Cx lesion, 60 %stenosed.  Ost LM to LM lesion, 60  %stenosed.  SVG graft was visualized by angiography.  LIMA graft was visualized by angiography.  Mid LAD lesion, 99 %stenosed.  1. Severe triple vessel CAD s/p 3V CABG 2. Moderate distal left main stenosis 3. Subtotal occlusion of the mid LAD. Patent LIMA graft to mid LAD 4. Patent Circumflex system. Patent vein graft to the high obtuse marginal. This fills the entire lateral wall Circumflex system and some of the LAD. Of note, there is also antegrade flow into the Circumflex from injection of the left main. 5. The RCA is occluded proximally. The vein graft to the distal RCA is patent. The vein graft is small in caliber with no flow limiting stenoses. 6. Severe LV systolic dysfunction, LVEF=10-15%. 7. Wide complex tachycardia. This began after access in the left radial artery. As discussed in the narrative, we attempted DCCV x 2, bolus of amiodarone x 2, adenosine. This eventually converted to sinus with IV Lidocaine.  Recommendations: Medical management of CAD. EP to see to discuss wide complex tachycardia.  Findings Coronary Findings Diagnostic  Dominance: Right  Left Main  Left Anterior Descending  First Diagonal Branch Vessel is large in size.  Second Diagonal Branch Vessel is small in size.  Third Diagonal Branch Vessel is small in size.  Left Circumflex  First Obtuse Marginal Branch Vessel is large in size.  Second Obtuse Marginal Branch Vessel is moderate in size.  Third Obtuse Marginal Branch Vessel is moderate in size.  Right Coronary Artery The lesion is chronically occluded.  Saphenous Graft To RPDA SVG graft was visualized by angiography. The entire graft is small. There is a proximal stenosis followed by dilation of the vein graft (likely at a valve). The remainder of the graft is almost the same size as the proximal body.  Saphenous Graft To 1st Mrg SVG graft was visualized by angiography.  LIMA LIMA Graft To Mid LAD LIMA graft was visualized by  angiography.  Intervention  No  interventions have been documented.   STRESS TESTS  MYOCARDIAL PERFUSION IMAGING 10/05/2022  Narrative   Findings are consistent with infarction. The study is high risk due to severely depressed ejection fraction.   No ST deviation was noted.   LV perfusion is abnormal. Defect 1: There is a large defect with severe reduction in uptake present in the mid to basal inferior and inferolateral location(s) that is fixed. There is abnormal wall motion in the defect area. Consistent with infarction.   Left ventricular function is abnormal. Global function is severely reduced. Nuclear stress EF: 28 %. The left ventricular ejection fraction is severely decreased (<30%). End diastolic cavity size is severely enlarged. End systolic cavity size is severely enlarged.   Prior study not available for comparison.   ECHOCARDIOGRAM  ECHOCARDIOGRAM COMPLETE 10/05/2022  Narrative ECHOCARDIOGRAM REPORT    Patient Name:   ANTOAN LAGUNES Date of Exam: 10/05/2022 Medical Rec #:  [id number]       Height:       66.0 in Accession #:    [id number]      Weight:       135.0 lb Date of Birth:  1946/04/16        BSA:          1.692 m Patient Age:    76 years        BP:           128/64 mmHg Patient Gender: M               HR:           60 bpm. Exam Location:  [address]  Procedure: 2D Echo, Cardiac Doppler and Color Doppler  Indications:    R01.1 Murmur I42.9 Cardiomyopathy  History:        Patient has prior history of Echocardiogram examinations, most recent 02/05/2021. Cardiomyopathy, Previous Myocardial Infarction and CAD, Defibrillator; Risk Factors:Diabetes and Dyslipidemia. Chronic systolic heart failure. COPD.  Sonographer:    Daphine Deutscher RDCS Referring Phys: [id number] DAVID W HARDING  IMPRESSIONS   1. Left ventricular ejection fraction, by estimation, is 40 to 45%. The left ventricle has mildly decreased function. The left ventricle demonstrates regional  wall motion abnormalities with basal to mid anterolateral and inferolateral akinesis and basal inferior akinesis. The left ventricular internal cavity size was mildly dilated. There is mild concentric left ventricular hypertrophy. Left ventricular diastolic parameters are consistent with Grade II diastolic dysfunction (pseudonormalization). 2. Right ventricular systolic function is moderately reduced. The right ventricular size is mildly enlarged. There is severely elevated pulmonary artery systolic pressure. The estimated right ventricular systolic pressure is 66.7 mmHg. 3. Left atrial size was severely dilated. 4. Right atrial size was mild to moderately dilated. 5. The mitral valve is abnormal. Severe mitral valve regurgitation. 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 No evidence of mitral stenosis. Moderate mitral annular calcification. 6. Tricuspid valve regurgitation is mild to moderate. 7. The aortic valve is tricuspid. There is moderate calcification of the aortic valve. Aortic valve regurgitation is mild. No aortic stenosis is present. 8. The inferior vena cava is normal in size with <50% respiratory variability, suggesting right atrial pressure of 8 mmHg.  FINDINGS Left Ventricle: Left ventricular ejection fraction, by estimation, is 40 to 45%. The left ventricle has mildly decreased function. The left ventricle demonstrates regional wall motion abnormalities. The left ventricular internal cavity size was mildly dilated. There is mild concentric left ventricular hypertrophy. Left  ventricular diastolic parameters are consistent with Grade II diastolic dysfunction (pseudonormalization).  Right Ventricle: The right ventricular size is mildly enlarged. No increase in right ventricular wall thickness. Right ventricular systolic function is moderately reduced. There is severely elevated pulmonary artery  systolic pressure. The tricuspid regurgitant velocity is 3.83 m/s, and with an assumed right atrial pressure of 8 mmHg, the estimated right ventricular systolic pressure is 66.7 mmHg.  Left Atrium: Left atrial size was severely dilated.  Right Atrium: Right atrial size was mild to moderately dilated.  Pericardium: There is no evidence of pericardial effusion.  Mitral Valve: The mitral valve is abnormal. There is moderate calcification of the mitral valve leaflet(s). Moderate mitral annular calcification. Severe mitral valve regurgitation. No evidence of mitral valve stenosis.  Tricuspid Valve: The tricuspid valve is normal in structure. Tricuspid valve regurgitation is mild to moderate.  Aortic Valve: The aortic valve is tricuspid. There is moderate calcification of the aortic valve. Aortic valve regurgitation is mild. Aortic regurgitation PHT measures 554 msec. No aortic stenosis is present. Aortic valve mean gradient measures 7.4 mmHg. Aortic valve peak gradient measures 13.8 mmHg. Aortic valve area, by VTI measures 0.91 cm.  Pulmonic Valve: The pulmonic valve was normal in structure. Pulmonic valve regurgitation is not visualized.  Aorta: The aortic root is normal in size and structure.  Venous: The inferior vena cava is normal in size with less than 50% respiratory variability, suggesting right atrial pressure of 8 mmHg.  IAS/Shunts: No atrial level shunt detected by color flow Doppler.  Additional Comments: A device lead is visualized in the right ventricle.   LEFT VENTRICLE PLAX 2D LVIDd:         6.30 cm   Diastology LVIDs:         4.80 cm   LV e' medial:    4.51 cm/s LV PW:         0.90 cm   LV E/e' medial:  23.9 LV IVS:        1.00 cm   LV e' lateral:   4.89 cm/s LVOT diam:     2.00 cm   LV E/e' lateral: 22.1 LV SV:         33 LV SV Index:   19 LVOT Area:     3.14 cm   RIGHT VENTRICLE            IVC RV Basal diam:  4.50 cm    IVC diam: 1.70 cm RV S prime:     8.56  cm/s TAPSE (M-mode): 2.0 cm  LEFT ATRIUM              Index        RIGHT ATRIUM           Index LA diam:        5.50 cm  3.25 cm/m   RA Area:     20.50 cm LA Vol (A2C):   135.0 ml 79.78 ml/m  RA Volume:   64.40 ml  38.06 ml/m LA Vol (A4C):   114.0 ml 67.37 ml/m LA Biplane Vol: 125.0 ml 73.87 ml/m AORTIC VALVE AV Area (Vmax):    0.90 cm AV Area (Vmean):   0.86 cm AV Area (VTI):     0.91 cm AV Vmax:           185.80 cm/s AV Vmean:          122.200 cm/s AV VTI:            0.357 m  AV Peak Grad:      13.8 mmHg AV Mean Grad:      7.4 mmHg LVOT Vmax:         52.95 cm/s LVOT Vmean:        33.550 cm/s LVOT VTI:          0.104 m LVOT/AV VTI ratio: 0.29 AI PHT:            554 msec  AORTA Ao Root diam: 3.20 cm  MITRAL VALVE                TRICUSPID VALVE MV Area (PHT): 3.27 cm     TR Peak grad:   58.7 mmHg MV Decel Time: 232 msec     TR Vmax:        383.00 cm/s MR Peak grad: 102.6 mmHg MR Mean grad: 67.5 mmHg     SHUNTS MR Vmax:      506.50 cm/s   Systemic VTI:  0.10 m MR Vmean:     390.0 cm/s    Systemic Diam: 2.00 cm MV E velocity: 108.00 cm/s MV A velocity: 55.00 cm/s MV E/A ratio:  1.96  Dalton McleanMD Electronically signed by Wilfred Lacy Signature Date/Time: 10/05/2022/5:41:30 PM    Final              EKG:  EKG is ordered today.  The ekg ordered today demonstrates a sensed V paced rhythm  Recent Labs: 04/30/2022: Magnesium 2.1  Recent Lipid Panel    Component Value Date/Time   CHOL 115 10/07/2021 1408   TRIG 91 10/07/2021 1408   HDL 35 (L) 10/07/2021 1408   CHOLHDL 3.3 10/07/2021 1408   CHOLHDL 5.0 05/14/2021 0335   VLDL 22 05/14/2021 0335   LDLCALC 62 10/07/2021 1408     Risk Assessment/Calculations:                Physical Exam:    VS:  BP 132/70   Pulse (!) 56   Ht 5\' 6"  (1.676 m)   Wt 127 lb 3.2 oz (57.7 kg)   SpO2 99%   BMI 20.53 kg/m     Wt Readings from Last 3 Encounters:  01/24/23 127 lb 3.2 oz (57.7 kg)  12/14/22 138  lb (62.6 kg)  11/15/22 138 lb (62.6 kg)     GEN: Thin, chronically ill-appearing male in no acute distress, in wheelchair HEENT: Normal NECK: No JVD; No carotid bruits LYMPHATICS: No lymphadenopathy CARDIAC: RRR, 3/6 holosystolic murmur at the apex RESPIRATORY:  Clear to auscultation without rales, wheezing or rhonchi  ABDOMEN: Soft, non-tender, non-distended MUSCULOSKELETAL:  No edema; No deformity  SKIN: Warm and dry NEUROLOGIC:  Alert and oriented x 3 PSYCHIATRIC:  Normal affect   ASSESSMENT:    1. Nonrheumatic mitral valve regurgitation    PLAN:    In order of problems listed above:  The patient has severe, nonrheumatic mitral regurgitation associated with ischemic heart disease and a dense inferior wall motion abnormality with history of old inferior MI.  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  I reviewed the natural history of secondary mitral regurgitation with the patient and his family members today.  We discussed this in light of his significant comorbid conditions outlined above.  I discussed the transcatheter edge-to-edge repair technique and explained that this can be done via percutaneous transfemoral approach under general anesthesia with TEE guidance.  We reviewed risks, indications, and alternatives.  The patient  understands that his evaluation would have to include a diagnostic heart catheterization, transesophageal echo to further understand the functional anatomy of the mitral valve and suitability for TEER, and advanced heart failure consultation.  Once the studies/assessment are completed, transcatheter edge-to-edge repair of the mitral valve would be a reasonable treatment option for him.  At this point, the patient is more concerned about his legs and he really does not want to move forward with the necessary workup for transcatheter mitral valve repair at this time.  He  is willing to come back for follow-up in 6 months with an echocardiogram.  He understands the potential symptoms related to severe mitral regurgitation that might occur and he will keep an eye out for progressive fatigue, weakness, peripheral swelling, or shortness of breath.           Medication Adjustments/Labs and Tests Ordered: Current medicines are reviewed at length with the patient today.  Concerns regarding medicines are outlined above.  Orders Placed This Encounter  Procedures   ECHOCARDIOGRAM COMPLETE   No orders of the defined types were placed in this encounter.   Patient Instructions  Medication Instructions:  Your physician recommends that you continue on your current medications as directed. Please refer to the Current Medication list given to you today.  *If you need a refill on your cardiac medications before your next appointment, please call your pharmacy*   Lab Work: NONE If you have labs (blood work) drawn today and your tests are completely normal, you will receive your results only by: MyChart Message (if you have MyChart) OR A paper copy in the mail If you have any lab test that is abnormal or we need to change your treatment, we will call you to review the results.   Testing/Procedures: ECHO (in 6 months, same day as appt) Your physician has requested that you have an echocardiogram. Echocardiography is a painless test that uses sound waves to create images of your heart. It provides your doctor with information about the size and shape of your heart and how well your heart's chambers and valves are working. This procedure takes approximately one hour. There are no restrictions for this procedure. Please do NOT wear cologne, perfume, aftershave, or lotions (deodorant is allowed). Please arrive 15 minutes prior to your appointment time.  Follow-Up: At [facility], you and your health needs are our priority.  As part of our continuing mission to  provide you with exceptional heart care, we have created designated Provider Care Teams.  These Care Teams include your primary Cardiologist (physician) and Advanced Practice Providers (APPs -  Physician Assistants and Nurse Practitioners) who all work together to provide you with the care you need, when you need it.  Your next appointment:   6 month(s)  Provider:   Tonny Bollman, MD       Signed, Tonny Bollman, MD  01/24/2023 5:07 PM    [facility]

## 2023-01-24 NOTE — Patient Instructions (Signed)
Medication Instructions:  Your physician recommends that you continue on your current medications as directed. Please refer to the Current Medication list given to you today.  *If you need a refill on your cardiac medications before your next appointment, please call your pharmacy*   Lab Work: NONE If you have labs (blood work) drawn today and your tests are completely normal, you will receive your results only by: MyChart Message (if you have MyChart) OR A paper copy in the mail If you have any lab test that is abnormal or we need to change your treatment, we will call you to review the results.   Testing/Procedures: ECHO (in 6 months, same day as appt) Your physician has requested that you have an echocardiogram. Echocardiography is a painless test that uses sound waves to create images of your heart. It provides your doctor with information about the size and shape of your heart and how well your heart's chambers and valves are working. This procedure takes approximately one hour. There are no restrictions for this procedure. Please do NOT wear cologne, perfume, aftershave, or lotions (deodorant is allowed). Please arrive 15 minutes prior to your appointment time.  Follow-Up: At  [facility], you and your health needs are our priority.  As part of our continuing mission to provide you with exceptional heart care, we have created designated Provider Care Teams.  These Care Teams include your primary Cardiologist (physician) and Advanced Practice Providers (APPs -  Physician Assistants and Nurse Practitioners) who all work together to provide you with the care you need, when you need it.   Your next appointment:   6 month(s)  Provider:   Michael Cooper, MD   

## 2023-01-25 ENCOUNTER — Telehealth: Payer: Self-pay | Admitting: Cardiovascular Disease

## 2023-01-25 ENCOUNTER — Other Ambulatory Visit: Payer: Self-pay

## 2023-01-25 MED ORDER — DICLOXACILLIN SODIUM 250 MG PO CAPS: 500.0000 mg | ORAL_CAPSULE | Freq: Three times a day (TID) | ORAL | 0 refills | Status: DC

## 2023-01-25 NOTE — Telephone Encounter (Signed)
30 day supply of RX now sent to pharmacy

## 2023-01-25 NOTE — Telephone Encounter (Signed)
*  STAT* If patient is at the pharmacy, call can be transferred to refill team.   1. Which medications need to be refilled? (please list name of each medication and dose if known) dicloxacillin (DYNAPEN) 250 MG capsule  2. Which pharmacy/location (including street and city if local pharmacy) is medication to be sent to?[facility] - [place], [place] - [address]  3. Do they need a 30 day or 90 day supply? 30 Day Supply

## 2023-01-28 ENCOUNTER — Other Ambulatory Visit: Payer: Self-pay

## 2023-01-28 DIAGNOSIS — I70229 Atherosclerosis of native arteries of extremities with rest pain, unspecified extremity: Secondary | ICD-10-CM

## 2023-01-28 DIAGNOSIS — I779 Disorder of arteries and arterioles, unspecified: Secondary | ICD-10-CM

## 2023-01-28 NOTE — Progress Notes (Signed)
EPIC Encounter for ICM Monitoring  Patient Name: [name] [name] is a 77 y.o. male Date: 01/28/2023 Primary Care Physican: Dellia Cloud, MD Primary Cardiologist: Herbie Baltimore Electrophysiologist: Rae Roam Pacing: 88%     08/31/2022 Weight: 127 lbs 10/13/2022 Weight: 130 lbs 01/28/2023 Weight: 125 lbs   AT/AF Burden 9.7%     Spoke with patient and heart failure questions reviewed.  Transmission results reviewed.  Pt reports weight gain, SOB and swelling of feet/legs during April and took PRN Lasix which relieved symptoms.    Corvue thoracic impedance suggesting normal fluid levels with the exception of possible fluid accumulation from 4/6-4/14 and 4/17-4/24.   Prescribed:  Furosemide 20 mg Take 0.5 tablets (10 mg total) by mouth daily. May take additional dose as needed for worsening swelling.   Aspirin 81 mg daily   Labs: 11/20/2021 Creatinine 0.86, BUN 8,   Potassium 5.2, Sodium 131, GFR 91 11/11/2021 Creatinine 0.88, BUN 10, Potassium 5.5, Sodium 128, GFR 91 10/07/2021 Creatinine 0.86, BUN 8,   Potassium 5.3, Sodium 131, GFR 91 A complete set of results can be found in Results Review.   Recommendations:  No changes and encouraged to call if experiencing any fluid symptoms.   Follow-up plan: ICM clinic phone appointment on 03/07/2023.   91 day device clinic remote transmission 04/15/2023.     EP/Cardiology Office Visits:  08/01/2023 with Dr Excell Seltzer.   Recall 05/22/2023 with Dr Ladona Ridgel.   Copy of ICM check sent to Dr. Ladona Ridgel.    3 month ICM trend: 01/24/2023.    12-14 Month ICM trend:     Karie Soda, RN 01/28/2023 4:33 PM

## 2023-01-28 NOTE — Addendum Note (Signed)
Addended by: Anselm Pancoast R on: 01/28/2023 04:52 PM   Modules accepted: Orders

## 2023-02-03 ENCOUNTER — Other Ambulatory Visit: Payer: Self-pay | Admitting: Internal Medicine

## 2023-02-03 DIAGNOSIS — I480 Paroxysmal atrial fibrillation: Secondary | ICD-10-CM

## 2023-02-03 DIAGNOSIS — Z95810 Presence of automatic (implantable) cardiac defibrillator: Secondary | ICD-10-CM

## 2023-02-04 ENCOUNTER — Telehealth: Payer: Self-pay

## 2023-02-04 NOTE — Telephone Encounter (Signed)
Following alert received from [employer] received for exceed AF in progress from 5/16, controlled rates. Burden 11%, DAPT per EPIC - no OAC.  Wife states patient is asleep but she will have patient call when he wakes up.

## 2023-02-04 NOTE — Telephone Encounter (Signed)
Pt returning nurse call

## 2023-02-04 NOTE — Telephone Encounter (Signed)
Patient reports of feeling fatigued and shortness of breath at times. Patient reports each time we add another medication he feels worse. Reports compliance with Sotalol. Advised patient I will forward to Dr. Ladona Ridgel for recommendation.

## 2023-02-06 NOTE — Telephone Encounter (Signed)
Continue sotalol

## 2023-02-07 NOTE — Telephone Encounter (Signed)
Spoke to patients wife, advised Dr. Ladona Ridgel advises patient not to change anything and continue taking Sotalol. Voiced understanding and states she will let patient know. Appreciative of call.

## 2023-02-08 NOTE — Progress Notes (Signed)
Remote ICD transmission.   

## 2023-02-15 ENCOUNTER — Telehealth: Payer: Self-pay

## 2023-02-15 NOTE — Telephone Encounter (Signed)
Outreach made to Pt.  Spoke with Pt's wife.  Per wife Pt has been having trouble with his leg, having difficulty walking.  Also wife states he has had "trouble with his breathing".  Advised of appt this Thursday with Dr. Ladona Ridgel.    Wife in agreement.  Thanked nurse for call.

## 2023-02-15 NOTE — Telephone Encounter (Signed)
Alert received from [employer]:  Device alert for VT/VF episodes with successful ATP therapy 5/22 @ 21:13 EGM shows likely AF with RVR, duration 18sec HR 164 falling into the VT-1 zone, ATP delivered x1 slowing rhythm.  8 additional SVT episodes showing AF with RVR, 14-36sec in duration 5/24 @ 23:53 EGM showing AF with likely RVR, ATPx4, followed by ATPx4, slowing arrhythmia with HV therapy aborted, duration 10sec HR 179.  2 NSVT without EGM's prior and 1 SVT 16sec in duration HR 181 - AF with RVR 5/25 @ 20:24 AF with RVR, ATP x1 slowing rate, duration 34sec, HR 169, 1 SVT following 34sec in duration , HR 175 - AF with RVR.  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  Burden 14%, Sotalol, DAPT - no OAC  Agree with report as above.  Will schedule Pt to see Dr. Ladona Ridgel to discuss tx options Feb 17, 2023 at 3:00 pm.

## 2023-02-17 ENCOUNTER — Emergency Department (HOSPITAL_COMMUNITY): Payer: Medicare Other

## 2023-02-17 ENCOUNTER — Other Ambulatory Visit: Payer: Self-pay

## 2023-02-17 ENCOUNTER — Ambulatory Visit: Payer: Medicare Other | Admitting: Internal Medicine

## 2023-02-17 ENCOUNTER — Encounter (HOSPITAL_COMMUNITY): Payer: Self-pay | Admitting: Pharmacy Technician

## 2023-02-17 ENCOUNTER — Observation Stay (HOSPITAL_COMMUNITY)
Admission: EM | Admit: 2023-02-17 | Discharge: 2023-02-18 | Disposition: A | Discharge status: Home / Self Care | Payer: Medicare Other | Attending: Cardiology | Admitting: Cardiology

## 2023-02-17 DIAGNOSIS — Z7901 Long term (current) use of anticoagulants: Secondary | ICD-10-CM | POA: Diagnosis not present

## 2023-02-17 DIAGNOSIS — I25119 Atherosclerotic heart disease of native coronary artery with unspecified angina pectoris: Secondary | ICD-10-CM | POA: Insufficient documentation

## 2023-02-17 DIAGNOSIS — Z79899 Other long term (current) drug therapy: Secondary | ICD-10-CM | POA: Diagnosis not present

## 2023-02-17 DIAGNOSIS — Z7902 Long term (current) use of antithrombotics/antiplatelets: Secondary | ICD-10-CM | POA: Insufficient documentation

## 2023-02-17 DIAGNOSIS — Z95810 Presence of automatic (implantable) cardiac defibrillator: Secondary | ICD-10-CM | POA: Insufficient documentation

## 2023-02-17 DIAGNOSIS — I480 Paroxysmal atrial fibrillation: Secondary | ICD-10-CM | POA: Diagnosis not present

## 2023-02-17 DIAGNOSIS — Z95828 Presence of other vascular implants and grafts: Secondary | ICD-10-CM | POA: Diagnosis not present

## 2023-02-17 DIAGNOSIS — I493 Ventricular premature depolarization: Secondary | ICD-10-CM | POA: Diagnosis not present

## 2023-02-17 DIAGNOSIS — R000 Tachycardia, unspecified: Secondary | ICD-10-CM | POA: Diagnosis not present

## 2023-02-17 DIAGNOSIS — E871 Hypo-osmolality and hyponatremia: Secondary | ICD-10-CM | POA: Insufficient documentation

## 2023-02-17 DIAGNOSIS — I4891 Unspecified atrial fibrillation: Secondary | ICD-10-CM | POA: Diagnosis present

## 2023-02-17 DIAGNOSIS — Z7982 Long term (current) use of aspirin: Secondary | ICD-10-CM | POA: Diagnosis not present

## 2023-02-17 DIAGNOSIS — I5022 Chronic systolic (congestive) heart failure: Secondary | ICD-10-CM | POA: Diagnosis not present

## 2023-02-17 DIAGNOSIS — I255 Ischemic cardiomyopathy: Secondary | ICD-10-CM | POA: Insufficient documentation

## 2023-02-17 DIAGNOSIS — Z87891 Personal history of nicotine dependence: Secondary | ICD-10-CM | POA: Insufficient documentation

## 2023-02-17 DIAGNOSIS — J449 Chronic obstructive pulmonary disease, unspecified: Secondary | ICD-10-CM | POA: Diagnosis not present

## 2023-02-17 DIAGNOSIS — E876 Hypokalemia: Secondary | ICD-10-CM | POA: Diagnosis not present

## 2023-02-17 DIAGNOSIS — Z951 Presence of aortocoronary bypass graft: Secondary | ICD-10-CM | POA: Insufficient documentation

## 2023-02-17 DIAGNOSIS — R531 Weakness: Secondary | ICD-10-CM | POA: Diagnosis not present

## 2023-02-17 DIAGNOSIS — R0789 Other chest pain: Secondary | ICD-10-CM | POA: Diagnosis present

## 2023-02-17 DIAGNOSIS — E1140 Type 2 diabetes mellitus with diabetic neuropathy, unspecified: Secondary | ICD-10-CM | POA: Insufficient documentation

## 2023-02-17 DIAGNOSIS — I491 Atrial premature depolarization: Secondary | ICD-10-CM | POA: Diagnosis not present

## 2023-02-17 DIAGNOSIS — I499 Cardiac arrhythmia, unspecified: Secondary | ICD-10-CM | POA: Diagnosis not present

## 2023-02-17 DIAGNOSIS — Z4502 Encounter for adjustment and management of automatic implantable cardiac defibrillator: Principal | ICD-10-CM

## 2023-02-17 LAB — CUP PACEART REMOTE DEVICE CHECK
Battery Remaining Longevity: 20 mo
Battery Remaining Percentage: 23 %
Battery Voltage: 2.8 V
Brady Statistic AP VP Percent: 3.3 %
Brady Statistic AP VS Percent: 1 %
Brady Statistic AS VP Percent: 85 %
Brady Statistic AS VS Percent: 2.6 %
Brady Statistic RA Percent Paced: 1.5 %
Date Time Interrogation Session: 20240530020029
HighPow Impedance: 54 Ohm
HighPow Impedance: 54 Ohm
Implantable Lead Connection Status: 753985
Implantable Lead Connection Status: 753985
Implantable Lead Connection Status: 753985
Implantable Lead Implant Date: 20171215
Implantable Lead Implant Date: 20171215
Implantable Lead Implant Date: 20171215
Implantable Lead Location: 753858
Implantable Lead Location: 753859
Implantable Lead Location: 753860
Implantable Lead Model: 7122
Implantable Pulse Generator Implant Date: 20171215
Lead Channel Impedance Value: 340 Ohm
Lead Channel Impedance Value: 450 Ohm
Lead Channel Impedance Value: 450 Ohm
Lead Channel Pacing Threshold Amplitude: 0.75 V
Lead Channel Pacing Threshold Amplitude: 1 V
Lead Channel Pacing Threshold Amplitude: 2.125 V
Lead Channel Pacing Threshold Pulse Width: 0.5 ms
Lead Channel Pacing Threshold Pulse Width: 0.5 ms
Lead Channel Pacing Threshold Pulse Width: 0.5 ms
Lead Channel Sensing Intrinsic Amplitude: 0.9 mV
Lead Channel Sensing Intrinsic Amplitude: 11.7 mV
Lead Channel Setting Pacing Amplitude: 2 V
Lead Channel Setting Pacing Amplitude: 2 V
Lead Channel Setting Pacing Amplitude: 3.125
Lead Channel Setting Pacing Pulse Width: 0.5 ms
Lead Channel Setting Pacing Pulse Width: 0.5 ms
Lead Channel Setting Sensing Sensitivity: 0.5 mV
Pulse Gen Serial Number: 7368976

## 2023-02-17 LAB — GLUCOSE, CAPILLARY
Glucose-Capillary: 179 mg/dL — ABNORMAL HIGH (ref 70–99)
Glucose-Capillary: 248 mg/dL — ABNORMAL HIGH (ref 70–99)

## 2023-02-17 LAB — BASIC METABOLIC PANEL
Anion gap: 11 (ref 5–15)
BUN: 19 mg/dL (ref 8–23)
CO2: 26 mmol/L (ref 22–32)
Calcium: 8.7 mg/dL — ABNORMAL LOW (ref 8.9–10.3)
Chloride: 89 mmol/L — ABNORMAL LOW (ref 98–111)
Creatinine, Ser: 0.88 mg/dL (ref 0.61–1.24)
GFR, Estimated: >60 mL/min (ref >60)
Glucose, Bld: 208 mg/dL — ABNORMAL HIGH (ref 70–99)
Potassium: 3.4 mmol/L — ABNORMAL LOW (ref 3.5–5.1)
Sodium: 126 mmol/L — ABNORMAL LOW (ref 135–145)

## 2023-02-17 LAB — CBC
HCT: 39.1 % (ref 39.0–52.0)
Hemoglobin: 13 g/dL (ref 13.0–17.0)
MCH: 28.1 pg (ref 26.0–34.0)
MCHC: 33.2 g/dL (ref 30.0–36.0)
MCV: 84.6 fL (ref 80.0–100.0)
Platelets: 234 10*3/uL (ref 150–400)
RBC: 4.62 MIL/uL (ref 4.22–5.81)
RDW: 15 % (ref 11.5–15.5)
WBC: 8.7 10*3/uL (ref 4.0–10.5)
nRBC: 0 % (ref 0.0–0.2)

## 2023-02-17 MED ORDER — PANTOPRAZOLE SODIUM 40 MG PO TBEC
40.0000 mg | DELAYED_RELEASE_TABLET | Freq: Every day | ORAL | Status: DC
  Administered 2023-02-18: 40 mg via ORAL
  Filled 2023-02-17: qty 1

## 2023-02-17 MED ORDER — INSULIN ASPART 100 UNIT/ML IJ SOLN
0.0000 [IU] | Freq: Three times a day (TID) | INTRAMUSCULAR | Status: DC
  Administered 2023-02-18: 3 [IU] via SUBCUTANEOUS
  Administered 2023-02-18: 2 [IU] via SUBCUTANEOUS

## 2023-02-17 MED ORDER — ASPIRIN 81 MG PO CHEW
81.0000 mg | CHEWABLE_TABLET | Freq: Every day | ORAL | Status: DC
  Administered 2023-02-17 – 2023-02-18 (×2): 81 mg via ORAL
  Filled 2023-02-17 (×2): qty 1

## 2023-02-17 MED ORDER — MUPIROCIN CALCIUM 2 % EX CREA
TOPICAL_CREAM | Freq: Two times a day (BID) | CUTANEOUS | Status: DC
  Filled 2023-02-17: qty 15

## 2023-02-17 MED ORDER — CARVEDILOL 3.125 MG PO TABS
3.1250 mg | ORAL_TABLET | Freq: Two times a day (BID) | ORAL | Status: DC
  Administered 2023-02-17 – 2023-02-18 (×2): 3.125 mg via ORAL
  Filled 2023-02-17 (×2): qty 1

## 2023-02-17 MED ORDER — ACETAMINOPHEN 325 MG PO TABS
650.0000 mg | ORAL_TABLET | ORAL | Status: DC | PRN
  Administered 2023-02-18: 650 mg via ORAL
  Filled 2023-02-17: qty 2

## 2023-02-17 MED ORDER — APIXABAN 5 MG PO TABS
5.0000 mg | ORAL_TABLET | Freq: Two times a day (BID) | ORAL | Status: DC
  Administered 2023-02-17 – 2023-02-18 (×2): 5 mg via ORAL
  Filled 2023-02-17 (×2): qty 1

## 2023-02-17 MED ORDER — MEXILETINE HCL 150 MG PO CAPS
150.0000 mg | ORAL_CAPSULE | Freq: Two times a day (BID) | ORAL | Status: DC
  Administered 2023-02-17 – 2023-02-18 (×2): 150 mg via ORAL
  Filled 2023-02-17 (×3): qty 1

## 2023-02-17 MED ORDER — POTASSIUM CHLORIDE CRYS ER 20 MEQ PO TBCR
40.0000 meq | EXTENDED_RELEASE_TABLET | Freq: Once | ORAL | Status: AC
  Administered 2023-02-17: 40 meq via ORAL
  Filled 2023-02-17: qty 2

## 2023-02-17 MED ORDER — FUROSEMIDE 20 MG PO TABS
20.0000 mg | ORAL_TABLET | Freq: Every day | ORAL | Status: DC
  Administered 2023-02-18: 20 mg via ORAL
  Filled 2023-02-17: qty 1

## 2023-02-17 MED ORDER — ATORVASTATIN CALCIUM 40 MG PO TABS
40.0000 mg | ORAL_TABLET | Freq: Every evening | ORAL | Status: DC
  Administered 2023-02-17: 40 mg via ORAL
  Filled 2023-02-17: qty 1

## 2023-02-17 MED ORDER — FLUTICASONE PROPIONATE 50 MCG/ACT NA SUSP: 2.0000 | Freq: Every day | NASAL | Status: DC | PRN

## 2023-02-17 MED ORDER — NICOTINE 14 MG/24HR TD PT24
14.0000 mg | MEDICATED_PATCH | Freq: Every day | TRANSDERMAL | Status: DC
  Administered 2023-02-17 – 2023-02-18 (×2): 14 mg via TRANSDERMAL
  Filled 2023-02-17 (×2): qty 1

## 2023-02-17 MED ORDER — MAGNESIUM OXIDE -MG SUPPLEMENT 400 (240 MG) MG PO TABS
400.0000 mg | ORAL_TABLET | Freq: Every day | ORAL | Status: DC
  Administered 2023-02-18: 400 mg via ORAL
  Filled 2023-02-17: qty 1

## 2023-02-17 MED ORDER — SODIUM CHLORIDE 0.9 % IV BOLUS
1000.0000 mL | Freq: Once | INTRAVENOUS | Status: AC
  Administered 2023-02-17: 1000 mL via INTRAVENOUS

## 2023-02-17 MED ORDER — UMECLIDINIUM BROMIDE 62.5 MCG/ACT IN AEPB
2.0000 | INHALATION_SPRAY | Freq: Every day | RESPIRATORY_TRACT | Status: DC
  Filled 2023-02-17: qty 7

## 2023-02-17 MED ORDER — ALBUTEROL SULFATE (2.5 MG/3ML) 0.083% IN NEBU
3.0000 mL | INHALATION_SOLUTION | Freq: Four times a day (QID) | RESPIRATORY_TRACT | Status: DC | PRN
  Administered 2023-02-17 – 2023-02-18 (×2): 3 mL via RESPIRATORY_TRACT
  Filled 2023-02-17 (×2): qty 3

## 2023-02-17 MED ORDER — DICLOXACILLIN SODIUM 250 MG PO CAPS
500.0000 mg | ORAL_CAPSULE | Freq: Three times a day (TID) | ORAL | Status: DC
  Administered 2023-02-17 – 2023-02-18 (×2): 500 mg via ORAL
  Filled 2023-02-17: qty 2
  Filled 2023-02-17 (×2): qty 1
  Filled 2023-02-17 (×3): qty 2

## 2023-02-17 MED ORDER — NITROGLYCERIN 0.4 MG SL SUBL: 0.4000 mg | SUBLINGUAL_TABLET | SUBLINGUAL | Status: DC | PRN

## 2023-02-17 NOTE — ED Provider Notes (Addendum)
[place] EMERGENCY DEPARTMENT AT [facility] Provider Note   CSN: [id number] Arrival date & time: 02/17/23  1412     History  Chief Complaint  Patient presents with   Defib firing    [name] [name] is a 77 y.o. male.  Patient with ICD, placed in 2017, and indicates today it shocked him 2x, which would be the first time the device fired. Indicates first time he was on the commode, and then shortly thereafter after walking into bedroom. Was feeling generally weak at the time. No lightheadedness or syncope. No current or recent chest pain or discomfort. No sob. No new extremity pain or swelling. Denies recent change in meds and indicates is compliant w meds. No fever or chills.   The history is provided by the patient, medical records and the EMS personnel.       Home Medications Prior to Admission medications   Medication Sig Start Date End Date Taking? Authorizing Provider  albuterol (VENTOLIN HFA) 108 (90 Base) MCG/ACT inhaler Inhale 1-2 puffs into the lungs every 6 (six) hours as needed for wheezing or shortness of breath.    [provider]  Alpha-Lipoic Acid 600 MG TABS Take 600 mg by mouth daily.    [provider]  aspirin EC 81 MG tablet Take 81 mg by mouth daily.    [provider]  atorvastatin (LIPITOR) 80 MG tablet Take 40 mg by mouth every evening. 04/14/20   [provider]  carvedilol (COREG) 3.125 MG tablet Take 1 tablet (3.125 mg total) by mouth 2 (two) times daily. 08/06/22   Marinus Maw, MD  cholecalciferol (VITAMIN D) 25 MCG (1000 UNIT) tablet Take 2,000 Units by mouth every evening.    [provider]  clopidogrel (PLAVIX) 75 MG tablet Take 1 tablet (75 mg total) by mouth daily at 6 (six) AM. 07/14/21   Setzer, Lynnell Jude, PA-C  dicloxacillin (DYNAPEN) 250 MG capsule Take 2 capsules (500 mg total) by mouth 3 (three) times daily. 01/25/23   Tonny Bollman, MD  fluticasone [facility]) 50 MCG/ACT nasal spray  Place 2 sprays into both nostrils daily as needed for allergies or rhinitis.    [provider]  furosemide (LASIX) 20 MG tablet Take 20 mg by mouth.    [provider]  glipiZIDE (GLUCOTROL) 5 MG tablet Take 5 mg by mouth 2 (two) times daily before a meal.    [provider]  lidocaine (LIDODERM) 5 % Place onto the skin. 10/14/22   [provider]  magnesium oxide (MAG-OX) 400 (241.3 Mg) MG tablet Take 1 tablet (400 mg total) by mouth daily. 09/04/16   Creig Hines, NP  metFORMIN (GLUCOPHAGE) 1000 MG tablet Take 1 tablet (1,000 mg total) by mouth 2 (two) times daily with a meal. Resume on 09/06/2016 09/04/16   Creig Hines, NP  naproxen (NAPROSYN) 500 MG tablet Take 500 mg by mouth as needed for moderate pain or mild pain.    [provider]  nitroGLYCERIN (NITROSTAT) 0.4 MG SL tablet Place 1 tablet (0.4 mg total) under the tongue every 5 (five) minutes x 3 doses as needed for chest pain. 09/04/16   Creig Hines, NP  pantoprazole (PROTONIX) 40 MG tablet Take 1 tablet (40 mg total) by mouth daily. 05/22/21   Marykay Lex, MD  sotalol (BETAPACE) 80 MG tablet Take 1 tablet by mouth twice daily 02/03/23   Marinus Maw, MD  Tiotropium Bromide Monohydrate (SPIRIVA RESPIMAT) 2.5  MCG/ACT AERS Inhale 2 puffs into the lungs at bedtime.    [provider]  valsartan (DIOVAN) 40 MG tablet Take 0.5 tablets (20 mg total) by mouth in the morning. 05/04/22   Marinus Maw, MD      Allergies    Lisinopril    Review of Systems   Review of Systems  Constitutional:  Negative for chills and fever.  HENT:  Negative for sore throat.   Eyes:  Negative for redness.  Respiratory:  Negative for shortness of breath.   Cardiovascular:  Negative for chest pain, palpitations and leg swelling.  Gastrointestinal:  Negative for abdominal pain, diarrhea and vomiting.  Genitourinary:  Negative for dysuria and flank pain.   Musculoskeletal:  Negative for back pain and neck pain.  Skin:  Negative for rash.  Neurological:  Negative for headaches.  Hematological:  Does not bruise/bleed easily.  Psychiatric/Behavioral:  Negative for confusion.     Physical Exam Updated Vital Signs BP (!) 118/97   Pulse (!) 44   Temp 98.7 F (37.1 C) (Oral)   Resp 16   SpO2 96%  Physical Exam Vitals and nursing note reviewed.  Constitutional:      Appearance: Normal appearance. He is well-developed.  HENT:     Head: Atraumatic.     Nose: Nose normal.     Mouth/Throat:     Mouth: Mucous membranes are moist.     Pharynx: Oropharynx is clear.  Eyes:     General: No scleral icterus.    Conjunctiva/sclera: Conjunctivae normal.     Pupils: Pupils are equal, round, and reactive to light.  Neck:     Vascular: No carotid bruit.     Trachea: No tracheal deviation.  Cardiovascular:     Rate and Rhythm: Normal rate and regular rhythm.     Pulses: Normal pulses.     Heart sounds: Murmur heard.     No friction rub. No gallop.  Pulmonary:     Effort: Pulmonary effort is normal. No accessory muscle usage or respiratory distress.     Breath sounds: Normal breath sounds.  Chest:     Chest wall: No tenderness.  Abdominal:     General: Bowel sounds are normal. There is no distension.     Palpations: Abdomen is soft.     Tenderness: There is no abdominal tenderness.  Musculoskeletal:        General: No swelling or tenderness.     Cervical back: Normal range of motion and neck supple. No rigidity.  Skin:    General: Skin is warm and dry.     Findings: No rash.  Neurological:     Mental Status: He is alert.     Comments: Alert, speech clear. Motor/sens intact bil.  Psychiatric:        Mood and Affect: Mood normal.     ED Results / Procedures / Treatments   Labs (all labs ordered are listed, but only abnormal results are displayed) Results for orders placed or performed during the hospital encounter of 02/17/23  CBC   Result Value Ref Range   WBC 8.7 4.0 - 10.5 K/uL   RBC 4.62 4.22 - 5.81 MIL/uL   Hemoglobin 13.0 13.0 - 17.0 g/dL   HCT 16.1 09.6 - 04.5 %   MCV 84.6 80.0 - 100.0 fL   MCH 28.1 26.0 - 34.0 pg   MCHC 33.2 30.0 - 36.0 g/dL   RDW 40.9 81.1 - 91.4 %   Platelets 234 150 -  400 K/uL   nRBC 0.0 0.0 - 0.2 %  Basic metabolic panel  Result Value Ref Range   Sodium 126 (L) 135 - 145 mmol/L   Potassium 3.4 (L) 3.5 - 5.1 mmol/L   Chloride 89 (L) 98 - 111 mmol/L   CO2 26 22 - 32 mmol/L   Glucose, Bld 208 (H) 70 - 99 mg/dL   BUN 19 8 - 23 mg/dL   Creatinine, Ser 1.61 0.61 - 1.24 mg/dL   Calcium 8.7 (L) 8.9 - 10.3 mg/dL   GFR, Estimated >09 >60 mL/min   Anion gap 11 5 - 15       EKG EKG Interpretation  Date/Time:  Thursday Feb 17 2023 14:28:07 EDT Ventricular Rate:  88 PR Interval:  169 QRS Duration: 181 QT Interval:  469 QTC Calculation: 568 R Axis:   -82 Text Interpretation: Electronic ventricular pacemaker Multiform ventricular premature complexes Confirmed by Cathren Laine ([id number]) on 02/17/2023 2:40:52 PM  Radiology No results found.  Procedures Procedures    Medications Ordered in ED Medications  sodium chloride 0.9 % bolus 1,000 mL (has no administration in time range)  potassium chloride SA (KLOR-CON M) CR tablet 40 mEq (has no administration in time range)    ED Course/ Medical Decision Making/ A&P                             Medical Decision Making Problems Addressed: Atrial fibrillation with RVR (HCC): acute illness or injury with systemic symptoms that poses a threat to life or bodily functions Defibrillator discharge: acute illness or injury with systemic symptoms that poses a threat to life or bodily functions Multifocal PVCs: acute illness or injury with systemic symptoms that poses a threat to life or bodily functions  Amount and/or Complexity of Data Reviewed Independent Historian: EMS    Details: hx External Data Reviewed: notes. Labs: ordered.  Decision-making details documented in ED Course. Radiology: ordered and independent interpretation performed. Decision-making details documented in ED Course. ECG/medicine tests: ordered and independent interpretation performed. Decision-making details documented in ED Course. Discussion of management or test interpretation with external provider(s): Cardiology, ICD service.   Risk Prescription drug management. Decision regarding hospitalization.   Iv ns. Continuous pulse ox and cardiac monitoring. Labs ordered/sent. Imaging ordered.   Differential diagnosis includes defib firing, defib malfxn, dysrhythmia, etc. Dispo decision including potential need for admission considered - will get labs and imaging and reassess.   Reviewed nursing notes and prior charts for additional history. External reports reviewed. Additional history from: EMS.   Cardiac monitor: sinus rhythm, rate 66.  Labs reviewed/interpreted by me - wbc and hgb normal. Chem w corrected na 128, k low 3.4. ns bolus. Kcl po.   Xrays reviewed/interpreted by me - pnd.  Cardiology consulted - discussed pt with Trish - they will see in ED.   RN to have device interrogated - indicates was told from tech/interrogator that showed afib/rvr, hr 160s, and that device appeared to be working normally.  Cardiology eval and chem pending  - signed out to f/u with those pending evals and dispo appropriately.            Final Clinical Impression(s) / ED Diagnoses Final diagnoses:  Defibrillator discharge  Atrial fibrillation with RVR (HCC)  Multifocal PVCs  Hyponatremia  Hypokalemia    Rx / DC Orders ED Discharge Orders     None  Cathren Laine, MD 02/17/23 [phone]

## 2023-02-17 NOTE — Discharge Instructions (Addendum)

## 2023-02-17 NOTE — ED Triage Notes (Signed)
Pt bib ems from home with ongoing weakness. Approx 1300 today defib fired X2. Pt with recent medication changes. Pt alert/oriented. VSS with ems. Pt has not had home medications today.18g LAC.  CBG 151 122/80  18 RR 96% RA HR 64 irregular

## 2023-02-17 NOTE — H&P (Addendum)
H&P   Patient ID: [name] [name] MRN: [id number]; DOB: 1945/12/04  Admit date: 02/17/2023 Date of Consult: 02/17/2023  PCP:  Dellia Cloud, MD   [facility] Providers Cardiologist:  Bryan Lemma, MD  Electrophysiologist:  Lewayne Bunting, MD  {    Patient Profile:   [name] [name] is a 77 y.o. male with a hx of CAD (CABG 2014), PVD (Aorto-bifem bypass 2014, endarterectomy with profundoplasty of his left common femoral artery and proximal left SFA by Dr. Chestine Spore on 05/13/2021 along with left SFA stenting), HTN, HLD, DM, AFib, ICM, ICD, chronic CHF (systolic), COPD, VT  who is being seen 02/17/2023 for the evaluation of ICD shocks at the request of Dr. Denton Lank.  Device information Abbott CRT-D implanted 09/03/2016   AAD hx Amiodarone stopped in 2020 with SOB and abnormal PFTs/ILD Sotalol started Nov 2023 with increasing AF and PVC burden  History of Present Illness:   [name] [name] saw Dr. Excell Seltzer 01/24/23, for evaluation of his MR, with progressive loss of functional capacity, increased DOE and increase murmur, and worsening ischemic MR, planned to pursue further w/u towards TEER  02/04/23, device clinic alerted to increased AFib burden associated with increased fatigue, SOB by notes, no changes were made, reported medication compliance without missed sotalol  02/15/23, device clinic alert for events in VT/VF zones w/ATPs, suspected to be AFib w/RVR, planned to see Dr. Ladona Ridgel in the office today He started to get shocks though and diverted to the ER  Device remote from ER Battery est 1.6 years Lead data looks stable (no a threshold in AF) Reported 9 episodes in VT1 zone, 9 ATPs delivered, 2 HV txs 15%AF burden Episode EGMs are reviewed AFib with RVR, all are inappropriate Presenting is SR/BP  LABS NA 126 K+ 3.4 BUN/Creat 19/0.88 WBC 8.7H/H 13/39 Plts 234  The patient and his daughter bedside, report that at baseline he has weak legs, following with his [place]  podiatrist and [facility] for his known severe PVD, and foot wounds. He has had a couple falls this week, not faints, where his legs get weak and he falls.  No reports of injury. No CP He has been having progressive SOB, seems for some time, though acutely worse in the last week or so. He tends to get nicks/scrapes but no known bleeding.heme history. He uses his albuterol a couple times a day   Past Medical History:  Diagnosis Date   Acid reflux    AICD (automatic cardioverter/defibrillator) present    a. 08/2016 St. Jude (serial Number [id number]) biventricular ICD.   Anemia    Chronic systolic CHF (congestive heart failure) (HCC)    a. 08/2016 Echo: EF 20%, diffuse HK, inf, post AK, mod-sev MR, sev dil LA, mod TR, PASP .   Complication of anesthesia    "was told I responded well to anesthesia"   COPD (chronic obstructive pulmonary disease) (HCC)    Coronary artery disease involving native coronary artery of native heart with angina pectoris (HCC)    a. 10/2012 MI after aortobifem bypass, found to have multivessel CAD -->CABG x3;  b. 08/2016 Cath: LM 60, LAD 47m, LCX 60ost/p, RCA 100p, VG->RPDA 50p, VG->OM1 ok, LIMA->LAD ok, EF 25%.   Essential hypertension 03/03/2012   History of blood transfusion 11/2012   "during his CABG"   History of gout X 1   History of stomach ulcers 1970s   Hyperlipidemia associated with type 2 diabetes mellitus (HCC) 09/16/2011   Ischemic cardiomyopathy  a. 08/2016 Echo: EF 20%, diffuse HK, inf, post AK;  b. 08/2016 St. Jude (serial Number [id number]) biventricular ICD.   Myocardial infarction [facility])    PAD (peripheral artery disease) (HCC)    s/p aortobifemoral bypass 10/2012   Peripheral neuropathy    PTSD (post-traumatic stress disorder)    Type II diabetes mellitus (HCC)    Ventricular tachycardia (HCC)    a. 08/2016 St. Jude (serial Number [id number]) biventricular ICD-->oral amio added.   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 Is now on chronic suppressive antibiotics with dicloxacillin    Past Surgical History:  Procedure Laterality Date   ABDOMINAL AORTOGRAM W/LOWER EXTREMITY Left 05/07/2021   Procedure: ABDOMINAL AORTOGRAM W/LOWER EXTREMITY;  Surgeon: [name], MD;  Location: [facility] INVASIVE CV LAB;  Service: Cardiovascular;  Laterality: Left;   AORTO-FEMORAL BYPASS GRAFT Bilateral 10/2012   [facility]   CARDIAC CATHETERIZATION  04/03/2013   [place] (? for abnormal Nuclear ST) --> no PCI, by report, patent grafts.   CARDIAC CATHETERIZATION N/A 09/02/2016   Procedure: Left Heart Cath and Cors/Grafts Angiography;  Surgeon: Kathleene Hazel, MD;  Location: [facility] INVASIVE CV LAB:: Ost LM 60% -> mid LAD 99% subCTO, ost-prox LCx 60%; prox RCA 100% CTO w/ (small) SVG->RPDA prox 50%; SVG->OM1 ok, LIMA->mLAD ok, EF 25% - complicated by VT during access - Shock x 2, Lidocaine & Amiodarone   CARDIAC CATHETERIZATION  11/09/2012   [facility]: due to Sustained VT post-Ob Ao-BiFem Bypass. (no Angina)   CHOLECYSTECTOMY  03/05/2012   Procedure: LAPAROSCOPIC CHOLECYSTECTOMY WITH INTRAOPERATIVE CHOLANGIOGRAM;  Surgeon: Emelia Loron, MD;  Location: [facility] OR;  Service: General;  Laterality: N/A;   CORONARY ARTERY BYPASS GRAFT  11/2012   [facility]: CABG X 3 -> LIMA-mLAD, SVG-highOM1, SVG-RPDA (dRCA).   ENDARTERECTOMY FEMORAL Left 05/13/2021   Procedure: Left Femoral Endarterectomy WITH BOVINE PATCH PROFUNDOPLASTY, REVISION OF LEFT LIMB OF AORTO-BIFEMORAL GRAFT WITH DACRON GRAFT;  Surgeon: Cephus Shelling, MD;  Location: [facility] OR;  Service: Vascular;  Laterality: Left;   EP IMPLANTABLE DEVICE N/A 09/03/2016   Procedure: BI-VENTRICULAR IMPLANTABLE CARDIOVERTER DEFIBRILLATOR  (CRT-D);  Surgeon: Marinus Maw, MD;  Location: [facility] INVASIVE CV LAB;  Service: Cardiovascular;  Laterality: N/A;   INGUINAL HERNIA REPAIR Left  1990s   INSERTION OF ILIAC STENT Left 05/13/2021   Procedure: INSERTION OF SUPERFICIAL FEMORAL ARTERY STENT TIMES THREE AND ANGIOPLASTY;  Surgeon: Cephus Shelling, MD;  Location: [facility] OR;  Service: Vascular;  Laterality: Left;   INTRAOPERATIVE ARTERIOGRAM Left 05/13/2021   Procedure: INTRA OPERATIVE ARTERIOGRAM OF LEFT LOWER EXTREMITY;  Surgeon: Cephus Shelling, MD;  Location: [facility] OR;  Service: Vascular;  Laterality: Left;   NM MYOVIEW LTD  10/05/2022   EF ~28%.  Severely reduced function.  High risk due to decreased EF.  Large fixed severe defect in the mid to basal inferior and inferolateral wall with associated wall motion abnormality consistent with prior infarction.  No evidence of reversible defect/ischemia. =? Echo EF 40-45% with mid to apical anterior/inferolateral akinesis and inferior akinesis.   STERNOTOMY  09/2013   [facility] [place]): Sternal Wound Infection -> re-do sternotomy with metal place "sheild" placed. -Has chronic staph infection, on chronic suppressive medication   TRANSTHORACIC ECHOCARDIOGRAM  10/05/2022   EF 40 to 45%.  Basal to mid anterolateral and inferolateral akinesis with basal inferior akinesis.  GR 2 DD.  Severely elevated PAP  with mild RV dilation.  RV P estimated 67 mmHg).  Severe LA dilation.  Mild to moderate RA dilation.  Mild to moderate TR.  Severe MR likely with restricted posterior leaflet and inferolateral wall motion.  Moderate AOV calcification with no stenosis.  Mild AI.   TRANSTHORACIC ECHOCARDIOGRAM  01/2021   EF 25 to 30%.  Severely depressed EF.  Global HK with worst basal to mid anterolateral and inferolateral as well as anterior basal inferior walls.  GR 1 DD.  Mildly reduced RV function.  Functionally bicuspid aortic valve with moderate calcification but no stenosis.  Overall slightly improved EF.   UMBILICAL HERNIA REPAIR  03/05/2012   Procedure: HERNIA REPAIR UMBILICAL ADULT;  Surgeon: Emelia Loron, MD;  Location: [facility] OR;  Service: General;   Laterality: N/A;     Home Medications:  Prior to Admission medications   Medication Sig Start Date End Date Taking? Authorizing Provider  albuterol (VENTOLIN HFA) 108 (90 Base) MCG/ACT inhaler Inhale 1-2 puffs into the lungs every 6 (six) hours as needed for wheezing or shortness of breath.    [provider]  Alpha-Lipoic Acid 600 MG TABS Take 600 mg by mouth daily.    [provider]  aspirin EC 81 MG tablet Take 81 mg by mouth daily.    [provider]  atorvastatin (LIPITOR) 80 MG tablet Take 40 mg by mouth every evening. 04/14/20   [provider]  carvedilol (COREG) 3.125 MG tablet Take 1 tablet (3.125 mg total) by mouth 2 (two) times daily. 08/06/22   Marinus Maw, MD  cholecalciferol (VITAMIN D) 25 MCG (1000 UNIT) tablet Take 2,000 Units by mouth every evening.    [provider]  clopidogrel (PLAVIX) 75 MG tablet Take 1 tablet (75 mg total) by mouth daily at 6 (six) AM. 07/14/21   Setzer, Lynnell Jude, PA-C  dicloxacillin (DYNAPEN) 250 MG capsule Take 2 capsules (500 mg total) by mouth 3 (three) times daily. 01/25/23   Tonny Bollman, MD  fluticasone [facility]) 50 MCG/ACT nasal spray Place 2 sprays into both nostrils daily as needed for allergies or rhinitis.    [provider]  furosemide (LASIX) 20 MG tablet Take 20 mg by mouth.    [provider]  glipiZIDE (GLUCOTROL) 5 MG tablet Take 5 mg by mouth 2 (two) times daily before a meal.    [provider]  lidocaine (LIDODERM) 5 % Place onto the skin. 10/14/22   [provider]  magnesium oxide (MAG-OX) 400 (241.3 Mg) MG tablet Take 1 tablet (400 mg total) by mouth daily. 09/04/16   Creig Hines, NP  metFORMIN (GLUCOPHAGE) 1000 MG tablet Take 1 tablet (1,000 mg total) by mouth 2 (two) times daily with a meal. Resume on 09/06/2016 09/04/16   Creig Hines, NP  naproxen (NAPROSYN) 500 MG tablet Take 500 mg by mouth as needed for moderate  pain or mild pain.    [provider]  nitroGLYCERIN (NITROSTAT) 0.4 MG SL tablet Place 1 tablet (0.4 mg total) under the tongue every 5 (five) minutes x 3 doses as needed for chest pain. 09/04/16   Creig Hines, NP  pantoprazole (PROTONIX) 40 MG tablet Take 1 tablet (40 mg total) by mouth daily. 05/22/21   Marykay Lex, MD  sotalol (BETAPACE) 80 MG tablet Take 1 tablet by mouth twice daily 02/03/23   Marinus Maw, MD  Tiotropium Bromide Monohydrate (SPIRIVA RESPIMAT) 2.5 MCG/ACT AERS Inhale 2 puffs into the lungs at  bedtime.    [provider]  valsartan (DIOVAN) 40 MG tablet Take 0.5 tablets (20 mg total) by mouth in the morning. 05/04/22   Marinus Maw, MD    Inpatient Medications: Scheduled Meds:  Continuous Infusions:  PRN Meds:   Allergies:    Allergies  Allergen Reactions   Lisinopril     Other reaction(s): Cough    Social History:   Social History   Socioeconomic History   Marital status: Married    Spouse name: Not on file   Number of children: Not on file   Years of education: Not on file   Highest education level: Not on file  Occupational History   Not on file  Tobacco Use   Smoking status: Every Day    Types: Pipe    Passive exposure: Never   Smokeless tobacco: Former    Types: Snuff, Chew   Tobacco comments:    09/03/2016 "used smokeless tobacco in my teens; quit smoking cigarettes in the early 1980s; smokes pipe only"  Vaping Use   Vaping Use: Never used  Substance and Sexual Activity   Alcohol use: No   Drug use: No   Sexual activity: Not on file  Other Topics Concern   Not on file  Social History Narrative   Routine activity around the house, but is limited more by musculoskeletal leg pain and weakness.      He enjoys smoking a pipe-long ago, he quit cigarettes   Social Determinants of Health   Financial Resource Strain: Not on file  Food Insecurity: Not on file  Transportation Needs: Not on file   Physical Activity: Not on file  Stress: Not on file  Social Connections: Not on file  Intimate Partner Violence: Not on file    Family History:   Family History  Problem Relation Age of Onset   Cancer Brother        esophageal     ROS:  Please see the history of present illness.  All other ROS reviewed and negative.     Physical Exam/Data:   Vitals:   02/17/23 1420  BP: (!) 136/118  Pulse: 64  Resp: 16  Temp: 98.7 F (37.1 C)  TempSrc: Oral  SpO2: 96%   No intake or output data in the 24 hours ending 02/17/23 1520    01/24/2023    3:53 PM 12/14/2022    3:07 PM 11/15/2022    4:10 PM  Last 3 Weights  Weight (lbs) 127 lb 3.2 oz 138 lb 138 lb  Weight (kg) 57.698 kg 62.596 kg 62.596 kg     There is no height or weight on file to calculate BMI.  General:  Well nourished, well developed, in no acute distress, appears chronically ill and older then his age HEENT: normal Neck: no JVD Vascular: No carotid bruits Cardiac:  RRR; 2+/6 SM  Lungs:  exp wheezes b/l  Abd: soft, non tender Ext: no edema LLE, RLE has 1-2+ edema, top just above the ankle Musculoskeletal:  No deformities, BUE and BLE strength normal and equal Skin: warm and dry  Neuro:  CNs 2-12 intact, no focal abnormalities noted Psych:  Normal affect   EKG:  The EKG was personally reviewed and demonstrates:    SR/V paced, 88bpm, PVCs, QTc is OK, accounting for QRS  OLD 01/24/23 SR/ V paced 56   Telemetry:  Telemetry was personally reviewed and demonstrates:  SR, V pacing frequent PVCs, intermittently with NSVTs 3-5beats are poly (2)  morphic  Relevant CV Studies:  10/05/22: Stress myoview Findings are consistent with infarction. The study is high risk due to severely depressed ejection fraction.   No ST deviation was noted.   LV perfusion is abnormal. Defect 1: There is a large defect with severe reduction in uptake present in the mid to basal inferior and inferolateral location(s) that is fixed.  There is abnormal wall motion in the defect area. Consistent with infarction.   Left ventricular function is abnormal. Global function is severely reduced. Nuclear stress EF: 28 %. The left ventricular ejection fraction is severely decreased (<30%). End diastolic cavity size is severely enlarged. End systolic cavity size is severely enlarged.   Prior study not available for comparison.  10/05/22: TTE . Left ventricular ejection fraction, by estimation, is 40 to 45%. The  left ventricle has mildly decreased function. The left ventricle  demonstrates regional wall motion abnormalities with basal to mid  anterolateral and inferolateral akinesis and basal   inferior akinesis. The left ventricular internal cavity size was mildly  dilated. There is mild concentric left ventricular hypertrophy. Left  ventricular diastolic parameters are consistent with Grade II diastolic  dysfunction (pseudonormalization).   2. Right ventricular systolic function is moderately reduced. The right  ventricular size is mildly enlarged. There is severely elevated pulmonary  artery systolic pressure. The estimated right ventricular systolic  pressure is 66.7 mmHg.   3. Left atrial size was severely dilated.   4. Right atrial size was mild to moderately dilated.   5. The mitral valve is abnormal. Severe mitral valve regurgitation. This  is likely infarct-related mitral regurgitation with restricted posterior  leaflet and inferolateral wall motion abnormalities. No evidence of mitral  stenosis. Moderate mitral  annular calcification.   6. Tricuspid valve regurgitation is mild to moderate.   7. The aortic valve is tricuspid. There is moderate calcification of the  aortic valve. Aortic valve regurgitation is mild. No aortic stenosis is  present.   8. The inferior vena cava is normal in size with <50% respiratory  variability, suggesting right atrial pressure of 8 mmHg.     02/05/2021: TTE 1. Left ventricular ejection  fraction, by estimation, 25-30%. The left  ventricle has severely decreased function. There is diffuse global  hypokinesis which appears worse in the basal-to-mid anterolateral,  basal-to-mid inferolateral, basal anterior and  basal inferior LV walls. The left ventricular internal cavity size was  mildly dilated. There is mild asymmetric left ventricular hypertrophy of  the basal-septal segment. Left ventricular diastolic parameters are  consistent with Grade I diastolic  dysfunction (impaired relaxation).   2. Right ventricular systolic function is mildly reduced. The right  ventricular size is normal.   3. The mitral valve is normal in structure. Mild mitral valve  regurgitation.   4. 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 There is moderate calcification of the  aortic valve. There is severe thickening of the aortic valve. Aortic valve  regurgitation is mild. Mild to moderate aortic valve  sclerosis/calcification is present, without any  evidence of aortic stenosis.   5. The inferior vena cava is normal in size with greater than 50%  respiratory variability, suggesting right atrial pressure of 3 mmHg.   Comparison(s): Compared to prior echo in 2018, the LVEF appears slightly  improved to 25-30% (previously 20-25%).      09/02/2016: LHC 1. Severe triple vessel CAD s/p 3V CABG 2. Moderate distal left main stenosis 3. Subtotal occlusion of the mid LAD. Patent LIMA graft to mid LAD  4. Patent Circumflex system. Patent vein graft to the high obtuse marginal. This fills the entire lateral wall Circumflex system and some of the LAD. Of note, there is also antegrade flow into the Circumflex from injection of the left main.  5. The RCA is occluded proximally. The vein graft to the distal RCA is patent. The vein graft is small in caliber with no flow limiting stenoses.  6. Severe LV systolic dysfunction, LVEF=10-15%.  7. Wide complex tachycardia. This began after access in the left radial artery. As  discussed in the narrative, we attempted DCCV x 2, bolus of amiodarone x 2, adenosine. This eventually converted to sinus with IV Lidocaine.    Recommendations: Medical management of CAD. EP to see to discuss wide complex tachycardia.     Laboratory Data:  High Sensitivity Troponin:  No results for input(s): "TROPONINIHS" in the last 720 hours.   ChemistryNo results for input(s): "NA", "K", "CL", "CO2", "GLUCOSE", "BUN", "CREATININE", "CALCIUM", "MG", "GFRNONAA", "GFRAA", "ANIONGAP" in the last 168 hours.  No results for input(s): "PROT", "ALBUMIN", "AST", "ALT", "ALKPHOS", "BILITOT" in the last 168 hours. Lipids No results for input(s): "CHOL", "TRIG", "HDL", "LABVLDL", "LDLCALC", "CHOLHDL" in the last 168 hours.  Hematology Recent Labs  Lab 02/17/23 1430  WBC 8.7  RBC 4.62  HGB 13.0  HCT 39.1  MCV 84.6  MCH 28.1  MCHC 33.2  RDW 15.0  PLT 234   Thyroid No results for input(s): "TSH", "FREET4" in the last 168 hours.  BNPNo results for input(s): "BNP", "PROBNP" in the last 168 hours.  DDimer No results for input(s): "DDIMER" in the last 168 hours.   Radiology/Studies:  No results found.   Assessment and Plan:   ICD therapies Paroxysmal Afib CHA2DS2Vasc is 6, not on a/c this has been discussed in his notes, maintained on DAPT  Clemmie Buelna need to consider [facility], his daughter with worries of his leg weakness, falls  3. PVCs frequent 4. NSVTs  D/w Dr. Ladona Ridgel No good AAD for his AF, pt correlates clear increase in his SOB with the addition of sotalol Kahlin Mark need OAC, start Eliquis, stop Plavix  Admit Stop sotalol > mexiletine 150mg  BID for his PVCs/NSVTs   4. CAD No CP No ischemic on recent stress myoview Continue home meds, Denzell Colasanti stop plavix and continue ASA w/Eliquis   5.   ICM 6    Chronic CHF LVEF 28% by myoview and 40-45% with mild RVEF reduction  He has some RLE edema CXR  CorVue do not suggest volume (though has been taking some extra lasix) Home meds   7.  VHD Severe MR Structural heart team w/u is in progress out patient   8. COPD estimated right ventricular systolic pressure is 66.7 mmHg. Home pulm drugs  Pt requested nicotine patch, ordered   9. DM SSI    Risk Assessment/Risk Scores:  {  For questions or updates, please contact [facility] Please consult [email or web address] for contact info under    Signed, Sheilah Pigeon, PA-C  02/17/2023 3:20 PM  I have seen and examined this patient with Francis Dowse.  Agree with above, note added to reflect my findings.  To the emergency room after shocks from his defibrillator.  He has a complex vascular and cardiac history as above.  He has a history of atrial fibrillation and has been in atrial fibrillation over the last few days.  He had been feeling poorly during this time.  He had 9 treated episodes, 2 of which were  treated with ICD shocks.  He did convert to sinus rhythm.  He is on sotalol for both atrial fibrillation and PVCs.  GEN: Well nourished, well developed, in no acute distress  HEENT: normal  Neck: no JVD, carotid bruits, or masses Cardiac: RRR; no murmurs, rubs, or gallops,no edema  Respiratory:  clear to auscultation bilaterally, normal work of breathing GI: soft, nontender, nondistended, + BS MS: no deformity or atrophy  Skin: warm and dry, device site well healed Neuro:  Strength and sensation are intact Psych: euthymic mood, full affect   ICD shock: Shock appeared inappropriate due to atrial fibrillation.  In sinus rhythm today.  No ICD changes at this time. Paroxysmal atrial fibrillation: CHA2DS2-VASc of 6.  Tajai Suder start him on Eliquis today.  Irlene Crudup stop his sotalol as he is in sinus rhythm.  He is if he has further episodes of atrial fibrillation, Kadeisha Betsch need an alternative rhythm control strategy to sotalol. PVCs: Was previously on sotalol.  Was more short of breath on this medication.  Rudean Icenhour start mexiletine 150 mg twice daily. Coronary artery disease: No  ischemic symptoms. Chronic systolic heart failure: Continue medical therapy.  Post biventricular ICD.  Hana Trippett M. Authur Cubit MD 02/17/2023 7:10 PM

## 2023-02-17 NOTE — ED Provider Notes (Signed)
3:46 PM Care assumed from Dr. Denton Lank.  At time of transfer of care, patient is waiting on cardiology evaluation and recommendations for management of the defibrillator firing multiple times today reportedly.  Anticipate follow-up on the recommendations.  Cardiology saw patient and will admit.     Mando Blatz, Canary Brim, MD 02/17/23 [phone]

## 2023-02-18 ENCOUNTER — Other Ambulatory Visit (HOSPITAL_COMMUNITY): Payer: Self-pay

## 2023-02-18 DIAGNOSIS — I4819 Other persistent atrial fibrillation: Secondary | ICD-10-CM | POA: Diagnosis not present

## 2023-02-18 LAB — BASIC METABOLIC PANEL
Anion gap: 11 (ref 5–15)
BUN: 16 mg/dL (ref 8–23)
CO2: 24 mmol/L (ref 22–32)
Calcium: 8.6 mg/dL — ABNORMAL LOW (ref 8.9–10.3)
Chloride: 94 mmol/L — ABNORMAL LOW (ref 98–111)
Creatinine, Ser: 0.92 mg/dL (ref 0.61–1.24)
GFR, Estimated: >60 mL/min (ref >60)
Glucose, Bld: 245 mg/dL — ABNORMAL HIGH (ref 70–99)
Potassium: 3.6 mmol/L (ref 3.5–5.1)
Sodium: 129 mmol/L — ABNORMAL LOW (ref 135–145)

## 2023-02-18 LAB — HEMOGLOBIN A1C
Hgb A1c MFr Bld: 8 % — ABNORMAL HIGH (ref 4.8–5.6)
Mean Plasma Glucose: 183 mg/dL

## 2023-02-18 LAB — GLUCOSE, CAPILLARY
Glucose-Capillary: 180 mg/dL — ABNORMAL HIGH (ref 70–99)
Glucose-Capillary: 223 mg/dL — ABNORMAL HIGH (ref 70–99)

## 2023-02-18 MED ORDER — MEXILETINE HCL 150 MG PO CAPS
150.0000 mg | ORAL_CAPSULE | Freq: Two times a day (BID) | ORAL | 5 refills | Status: DC
  Filled 2023-02-18: qty 60, 30d supply, fill #0

## 2023-02-18 MED ORDER — GLIPIZIDE 5 MG PO TABS
5.0000 mg | ORAL_TABLET | Freq: Two times a day (BID) | ORAL | Status: DC
  Filled 2023-02-18: qty 1

## 2023-02-18 MED ORDER — DIGOXIN 125 MCG PO TABS: 0.1250 mg | ORAL_TABLET | Freq: Every day | ORAL | 5 refills | Status: DC

## 2023-02-18 MED ORDER — DIGOXIN 125 MCG PO TABS: 0.1250 mg | ORAL_TABLET | Freq: Every day | ORAL | Status: DC

## 2023-02-18 MED ORDER — METFORMIN HCL 500 MG PO TABS: 1000.0000 mg | ORAL_TABLET | Freq: Two times a day (BID) | ORAL | Status: DC

## 2023-02-18 MED ORDER — DIGOXIN 125 MCG PO TABS
0.1250 mg | ORAL_TABLET | Freq: Once | ORAL | Status: AC
  Administered 2023-02-18: 0.125 mg via ORAL
  Filled 2023-02-18: qty 1

## 2023-02-18 MED ORDER — APIXABAN 5 MG PO TABS
5.0000 mg | ORAL_TABLET | Freq: Two times a day (BID) | ORAL | 5 refills | Status: DC
  Filled 2023-02-18: qty 60, 30d supply, fill #0

## 2023-02-18 MED ORDER — LIDOCAINE 5 % EX PTCH: 1.0000 | MEDICATED_PATCH | Freq: Every day | CUTANEOUS | Status: DC | PRN

## 2023-02-18 MED ORDER — DIGOXIN 125 MCG PO TABS
0.1250 mg | ORAL_TABLET | Freq: Every day | ORAL | 5 refills | Status: DC
  Filled 2023-02-18: qty 20, 20d supply, fill #0

## 2023-02-18 MED ORDER — MEDIHONEY WOUND/BURN DRESSING EX PSTE
1.0000 | PASTE | Freq: Every day | CUTANEOUS | Status: DC
  Filled 2023-02-18: qty 44

## 2023-02-18 MED ORDER — APIXABAN 5 MG PO TABS: 5.0000 mg | ORAL_TABLET | Freq: Two times a day (BID) | ORAL | 5 refills | Status: DC

## 2023-02-18 MED ORDER — MEXILETINE HCL 150 MG PO CAPS: 150.0000 mg | ORAL_CAPSULE | Freq: Two times a day (BID) | ORAL | 5 refills | Status: DC

## 2023-02-18 MED ORDER — DIGOXIN 125 MCG PO TABS: 0.1250 mg | ORAL_TABLET | ORAL | Status: DC

## 2023-02-18 NOTE — Consult Note (Signed)
WOC Nurse Consult Note: Reason for Consult:stage 2 PI to coccygeal area. Wound type:Pressure Pressure Injury POA: Yes Measurement:Per Nursing Flow Sheet on 02/17/23:  8.5cm x 2c x 0.1cm  Wound ZOX:WRUE, moist Drainage (amount, consistency, odor) None Periwound:intact Dressing procedure/placement/frequency: I have provided Nursing with guidance for the care of this partial thickness wound while in house At home, patient's wife is using Bactroban topped with white petrolatum gauze and topped with Telfa non adherent gauze. In house, daily cleansing with NS and covering with a 3mm layer of leptospermum Manuka honey (MediHoney) topped with dry gauze and secured with a silicone foam dressing for the sacrum will replace the home regimen. Turning and repositioning is in place, time in the supine position is to be minimized. Heels are to be floated.  WOC nursing team will not follow, but will remain available to this patient, the nursing and medical teams.  Please re-consult if needed.  Thank you for inviting [place] to participate in this patient's Plan of Care.  Ladona Mow, MSN, RN, CNS, GNP, Leda Min, [employer], [employer] phone:  [phone]

## 2023-02-18 NOTE — Care Management Obs Status (Signed)
MEDICARE OBSERVATION STATUS NOTIFICATION   Patient Details  Name: [name] [name] MRN: [id number] Date of Birth: 27-Sep-1945   Medicare Observation Status Notification Given:  Yes    Gala Lewandowsky, RN 02/18/2023, 12:13 PM

## 2023-02-18 NOTE — Care Management CC44 (Signed)
Condition Code 44 Documentation Completed  Patient Details  Name: [name] [name] MRN: [id number] Date of Birth: 1946/04/22   Condition Code 44 given:  Yes Patient signature on Condition Code 44 notice:  Yes Documentation of 2 MD's agreement:  Yes Code 44 added to claim:  Yes    Gala Lewandowsky, RN 02/18/2023, 12:14 PM

## 2023-02-18 NOTE — Discharge Summary (Addendum)
DISCHARGE SUMMARY    Patient ID: [name] [name],  MRN: [id number], DOB/AGE: May 12, 1946 77 y.o.  Admit date: 02/17/2023 Discharge date: 02/18/2023  Primary Care Physician: [facility]  Primary Cardiologist: Dr. Herbie Baltimore Electrophysiologist: Dr. Ladona Ridgel  Primary Discharge Diagnosis:  ICD therapies Inappropriate for Afib Paroxysmal Afib CHA2DS2Vasc is 6 > Eliquis 3.  PVCs, NSVT  Secondary Discharge Diagnosis:  CAD PVD Known b/l feet ulcers, follows with [facility] and podiatry DM HTN ICM Chronic CHF (systolic) Currently compensated COPD Sacral wound Has wound care at home [facility]   Allergies  Allergen Reactions   Lisinopril     Other reaction(s): Cough     Procedures This Admission:  none  Brief HPI: [name] [name] is a 77 y.o. male 02/04/23, device clinic alerted to increased AFib burden associated with increased fatigue, SOB by notes, no changes were made, reported medication compliance without missed sotalol 02/15/23, device clinic alert for events in VT/VF zones w/ATPs, suspected to be AFib w/RVR, planned to see Dr. Ladona Ridgel in the office today He started to get shocks though and diverted to the ER.   Hospital Course:  The patient was found to have ICD therapies inappropriate for rapid Afib HC therapies, converting him to SR. He was mildly hypokalemic (resolved).  He had no dizziness, syncope. He was unaware of the AFib from a symptom perspective, denied any CP, palpitations.  He did mention more SOB of late he noted started with sotalol, and then perhaps more acutely the last couple weeks. Suspect perhaps 2/2 Afib without evidence of volume OL, though had taken some extra lasix at home  He has struggled as well with progressive LE weakness, with known severe PVD, and podiatry for this/his wounds. Has had a couple falls, denied injury, hitting his head.  Also a sacral ulcer, he mentions spending hours seated at home at a time.  Advised to avoid that.  He sees  his wound care MD/clinic on Monday Cautioned on care with ambulation, importance of avoiding falls, he reports that he when out of the house has a scooter, urged caution, and use of mobility aids/help  Sotalol was stopped with on going PVCs, and recurrent Afib with his report of increased SOB with it.  Started on mexiletine noting significant PVC burden with some short NSVTs as well.  Will start Digoxin 0.125mg  daily Monday - Friday only, none on Saturday/Sunday The patient feels well, denies any CP or unusual SOB, with  He was examined by Dr. Ladona Ridgel and considered stable for discharge to home.    Physical Exam: Vitals:   02/17/23 1740 02/18/23 0026 02/18/23 0410 02/18/23 0751  BP: 120/81 121/73 127/84 (!) 136/91  Pulse: 86 91 82 85  Resp: 16 20 20 20   Temp: (!) 97.4 F (36.3 C) 97.6 F (36.4 C) 98.3 F (36.8 C)   TempSrc: Oral Oral Oral   SpO2: 98% 96% 97% 97%    GEN- The patient is well appearing, alert and oriented x 3 today.   HEENT: normocephalic, atraumatic; sclera clear, conjunctiva pink; hearing intact; oropharynx clear; neck supple, no JVP Lungs-  mild exp wheezes b/l, CTA b/l, normal work of breathing otherwise, no rales, rhonchi Heart- RRR, occ extrasystole, 2-3/6SM, no rubs or gallops, PMI not laterally displaced GI- soft, non-tender, non-distended Extremities- no clubbing, cyanosis, or edema MS- no significant deformity or atrophy Skin- warm and dry, no rash or lesion Known (PTA) sacral and b/l foot ulcers Psych- euthymic mood, full affect Neuro-  no gross deficits   Labs:   Lab Results  Component Value Date   WBC 8.7 02/17/2023   HGB 13.0 02/17/2023   HCT 39.1 02/17/2023   MCV 84.6 02/17/2023   PLT 234 02/17/2023    Recent Labs  Lab 02/18/23 0126  NA 129*  K 3.6  CL 94*  CO2 24  BUN 16  CREATININE 0.92  CALCIUM 8.6*  GLUCOSE 245*    Discharge Medications:  Allergies as of 02/18/2023       Reactions   Lisinopril    Other reaction(s): Cough         Medication List     STOP taking these medications    clopidogrel 75 MG tablet Commonly known as: PLAVIX   SOTALOL AF 80 MG Tabs       TAKE these medications    albuterol 108 (90 Base) MCG/ACT inhaler Commonly known as: VENTOLIN HFA Inhale 1-2 puffs into the lungs every 6 (six) hours as needed for wheezing or shortness of breath.   Alpha-Lipoic Acid 600 MG Tabs Take 600 mg by mouth daily.   apixaban 5 MG Tabs tablet Commonly known as: ELIQUIS Take 1 tablet (5 mg total) by mouth 2 (two) times daily.   aspirin EC 81 MG tablet Take 81 mg by mouth daily.   atorvastatin 80 MG tablet Commonly known as: LIPITOR Take 40 mg by mouth every evening.   bacitracin ointment Apply 1 Application topically 2 (two) times daily.   carvedilol 3.125 MG tablet Commonly known as: COREG Take 1 tablet (3.125 mg total) by mouth 2 (two) times daily.   cholecalciferol 25 MCG (1000 UNIT) tablet Commonly known as: VITAMIN D3 Take 2,000 Units by mouth every evening.   dicloxacillin 250 MG capsule Commonly known as: DYNAPEN Take 2 capsules (500 mg total) by mouth 3 (three) times daily.   digoxin 0.125 MG tablet Commonly known as: LANOXIN Take 1 tablet (0.125 mg total) by mouth daily. Monday through Friday only, None on Saturday and Sunday   fexofenadine 180 MG tablet Commonly known as: ALLEGRA Take 180 mg by mouth daily.   fluticasone 50 MCG/ACT nasal spray Commonly known as: FLONASE Place 2 sprays into both nostrils daily as needed for allergies or rhinitis.   furosemide 20 MG tablet Commonly known as: LASIX Take 20 mg by mouth.   glipiZIDE 5 MG tablet Commonly known as: GLUCOTROL Take 5 mg by mouth 2 (two) times daily before a meal.   lidocaine 5 % Commonly known as: LIDODERM Place 1 patch onto the skin daily as needed (For pain).   magnesium oxide 400 (241.3 Mg) MG tablet Commonly known as: MAG-OX Take 1 tablet (400 mg total) by mouth daily.   metFORMIN 1000 MG  tablet Commonly known as: GLUCOPHAGE Take 1 tablet (1,000 mg total) by mouth 2 (two) times daily with a meal. Resume on 09/06/2016   mexiletine 150 MG capsule Commonly known as: MEXITIL Take 1 capsule (150 mg total) by mouth every 12 (twelve) hours.   naproxen 500 MG tablet Commonly known as: NAPROSYN Take 500 mg by mouth as needed for moderate pain or mild pain.   nitroGLYCERIN 0.4 MG SL tablet Commonly known as: NITROSTAT Place 1 tablet (0.4 mg total) under the tongue every 5 (five) minutes x 3 doses as needed for chest pain.   pantoprazole 40 MG tablet Commonly known as: PROTONIX Take 1 tablet (40 mg total) by mouth daily.   Spiriva Respimat 2.5 MCG/ACT Aers Generic drug: Tiotropium Bromide  Monohydrate Inhale 2 puffs into the lungs at bedtime.   valsartan 40 MG tablet Commonly known as: DIOVAN Take 0.5 tablets (20 mg total) by mouth in the morning.               Discharge Care Instructions  (From admission, onward)           Start     Ordered   02/18/23 0000  Discharge wound care:       Comments: Continue as per your wound care clinic instrutions   02/18/23 1137            Disposition: Home Discharge Instructions     Diet - low sodium heart healthy   Complete by: As directed    Discharge wound care:   Complete by: As directed    Continue as per your wound care clinic instrutions   Increase activity slowly   Complete by: As directed         Duration of Discharge Encounter: Greater than 30 minutes including physician time.  Norma Fredrickson, PA-C 02/18/2023 11:37 AM  EP Attending  Patient seen and examined. Agree with the findings as above. His ectopy is improved. Due to the PAF, I have recommended he use digoxin 5 times a week. He will continue low dose mexitil. Usual followup.  Sharlot Gowda Alera Quevedo,MD

## 2023-02-18 NOTE — Progress Notes (Addendum)
Discharge instructions reviewed with pt and his wife.  Copy of instructions given to pt. [facility] are filling his new scripts and meds will be delivered to pt's room or will be picked up at the [facility] pharmacy on his way to main entrance.  [facility] has spoke with pt/wife and is working on this per wife and pt's primary nurse, Maralyn Sago.   Annice Needy, RN SWOT   1:13--printed scripts for pt to take to the [place] were obtained from pharmacy and given to pt/wife. Daughter here also, pt ready for discharge.  Pt to be d/c'd via wheelchair with belongings, to be escorted by hospital volunteer.

## 2023-03-01 ENCOUNTER — Ambulatory Visit (INDEPENDENT_AMBULATORY_CARE_PROVIDER_SITE_OTHER): Payer: Medicare Other | Admitting: Physician Assistant

## 2023-03-01 ENCOUNTER — Ambulatory Visit (INDEPENDENT_AMBULATORY_CARE_PROVIDER_SITE_OTHER)
Admission: RE | Admit: 2023-03-01 | Discharge: 2023-03-01 | Disposition: A | Discharge status: Home / Self Care | Payer: Medicare Other | Source: Ambulatory Visit | Attending: Vascular Surgery | Admitting: Vascular Surgery

## 2023-03-01 ENCOUNTER — Ambulatory Visit (HOSPITAL_COMMUNITY)
Admission: RE | Admit: 2023-03-01 | Discharge: 2023-03-01 | Disposition: A | Discharge status: Home / Self Care | Payer: Medicare Other | Source: Ambulatory Visit | Attending: Vascular Surgery | Admitting: Vascular Surgery

## 2023-03-01 VITALS — BP 138/73 | HR 63 | Temp 98.0°F | Resp 18 | Ht 66.0 in | Wt 129.0 lb

## 2023-03-01 DIAGNOSIS — I70229 Atherosclerosis of native arteries of extremities with rest pain, unspecified extremity: Secondary | ICD-10-CM | POA: Insufficient documentation

## 2023-03-01 DIAGNOSIS — M7989 Other specified soft tissue disorders: Secondary | ICD-10-CM

## 2023-03-01 DIAGNOSIS — I83029 Varicose veins of left lower extremity with ulcer of unspecified site: Secondary | ICD-10-CM | POA: Diagnosis not present

## 2023-03-01 DIAGNOSIS — I779 Disorder of arteries and arterioles, unspecified: Secondary | ICD-10-CM | POA: Insufficient documentation

## 2023-03-01 DIAGNOSIS — I739 Peripheral vascular disease, unspecified: Secondary | ICD-10-CM

## 2023-03-01 DIAGNOSIS — I83019 Varicose veins of right lower extremity with ulcer of unspecified site: Secondary | ICD-10-CM | POA: Diagnosis not present

## 2023-03-01 DIAGNOSIS — I70222 Atherosclerosis of native arteries of extremities with rest pain, left leg: Secondary | ICD-10-CM | POA: Diagnosis not present

## 2023-03-01 DIAGNOSIS — L97929 Non-pressure chronic ulcer of unspecified part of left lower leg with unspecified severity: Secondary | ICD-10-CM | POA: Diagnosis not present

## 2023-03-01 DIAGNOSIS — I872 Venous insufficiency (chronic) (peripheral): Secondary | ICD-10-CM | POA: Diagnosis not present

## 2023-03-01 DIAGNOSIS — L97919 Non-pressure chronic ulcer of unspecified part of right lower leg with unspecified severity: Secondary | ICD-10-CM

## 2023-03-01 LAB — VAS US ABI WITH/WO TBI
Left ABI: 0.9
Right ABI: 0.62

## 2023-03-01 NOTE — Progress Notes (Unsigned)
Office Note   History of Present Illness   [name] [name] is a 77 y.o. (17-Jan-1946) male who presents for surveillance of PAD.  He has a remote history of aortobifemoral bypass done at the [place] in 2014.  He also underwent left common femoral and SFA endarterectomy, revision of aortobifem left limb, and left SFA stenting by Dr. Chestine Spore on 05/13/2021.  This was done for left great toe tissue loss.  He returns today for studies and a wound check.  At his last visit with our office his left great toe wound was healed.  He did recently develop a right lateral heel wound, which has been under the management of his podiatrist.  It has been healing well.  Current Outpatient Medications  Medication Sig Dispense Refill   albuterol (VENTOLIN HFA) 108 (90 Base) MCG/ACT inhaler Inhale 1-2 puffs into the lungs every 6 (six) hours as needed for wheezing or shortness of breath.     Alpha-Lipoic Acid 600 MG TABS Take 600 mg by mouth daily.     apixaban (ELIQUIS) 5 MG TABS tablet Take 1 tablet (5 mg total) by mouth 2 (two) times daily. 60 tablet 5   aspirin EC 81 MG tablet Take 81 mg by mouth daily.     atorvastatin (LIPITOR) 80 MG tablet Take 40 mg by mouth every evening.     bacitracin ointment Apply 1 Application topically 2 (two) times daily.     carvedilol (COREG) 3.125 MG tablet Take 1 tablet (3.125 mg total) by mouth 2 (two) times daily. 180 tablet 3   cholecalciferol (VITAMIN D) 25 MCG (1000 UNIT) tablet Take 2,000 Units by mouth every evening.     dicloxacillin (DYNAPEN) 250 MG capsule Take 2 capsules (500 mg total) by mouth 3 (three) times daily. 180 capsule 0   digoxin (LANOXIN) 0.125 MG tablet Take 1 tablet (0.125 mg total) by mouth daily. Monday through Friday only, None on Saturday and Sunday 20 tablet 5   fexofenadine (ALLEGRA) 180 MG tablet Take 180 mg by mouth daily.     fluticasone (FLONASE) 50 MCG/ACT nasal spray Place 2 sprays into both nostrils daily as needed for allergies or rhinitis.      furosemide (LASIX) 20 MG tablet Take 20 mg by mouth.     glipiZIDE (GLUCOTROL) 5 MG tablet Take 5 mg by mouth 2 (two) times daily before a meal.     lidocaine (LIDODERM) 5 % Place 1 patch onto the skin daily as needed (For pain).     magnesium oxide (MAG-OX) 400 (241.3 Mg) MG tablet Take 1 tablet (400 mg total) by mouth daily. 30 tablet 6   metFORMIN (GLUCOPHAGE) 1000 MG tablet Take 1 tablet (1,000 mg total) by mouth 2 (two) times daily with a meal. Resume on 09/06/2016     mexiletine (MEXITIL) 150 MG capsule Take 1 capsule (150 mg total) by mouth every 12 (twelve) hours. 60 capsule 5   nitroGLYCERIN (NITROSTAT) 0.4 MG SL tablet Place 1 tablet (0.4 mg total) under the tongue every 5 (five) minutes x 3 doses as needed for chest pain. 25 tablet 3   pantoprazole (PROTONIX) 40 MG tablet Take 1 tablet (40 mg total) by mouth daily. 90 tablet 2   Tiotropium Bromide Monohydrate (SPIRIVA RESPIMAT) 2.5 MCG/ACT AERS Inhale 2 puffs into the lungs at bedtime.     valsartan (DIOVAN) 40 MG tablet Take 0.5 tablets (20 mg total) by mouth in the morning. 45 tablet 3   No current facility-administered  medications for this visit.    ***REVIEW OF SYSTEMS (negative unless checked):   Cardiac:  []  Chest pain or chest pressure? []  Shortness of breath upon activity? []  Shortness of breath when lying flat? []  Irregular heart rhythm?  Vascular:  []  Pain in calf, thigh, or hip brought on by walking? []  Pain in feet at night that wakes you up from your sleep? []  Blood clot in your veins? []  Leg swelling?  Pulmonary:  []  Oxygen at home? []  Productive cough? []  Wheezing?  Neurologic:  []  Sudden weakness in arms or legs? []  Sudden numbness in arms or legs? []  Sudden onset of difficult speaking or slurred speech? []  Temporary loss of vision in one eye? []  Problems with dizziness?  Gastrointestinal:  []  Blood in stool? []  Vomited blood?  Genitourinary:  []  Burning when urinating? []  Blood in  urine?  Psychiatric:  []  Major depression  Hematologic:  []  Bleeding problems? []  Problems with blood clotting?  Dermatologic:  []  Rashes or ulcers?  Constitutional:  []  Fever or chills?  Ear/Nose/Throat:  []  Change in hearing? []  Nose bleeds? []  Sore throat?  Musculoskeletal:  []  Back pain? []  Joint pain? []  Muscle pain?   Physical Examination  *** Vitals:   03/01/23 1446  BP: 138/73  Pulse: 63  Resp: 18  Temp: 98 F (36.7 C)  TempSrc: Temporal  SpO2: 96%  Weight: 129 lb (58.5 kg)  Height: 5\' 6"  (1.676 m)   ***Body mass index is 20.82 kg/m.  General:  WDWN in NAD; vital signs documented above Gait: Not observed HENT: WNL, normocephalic Pulmonary: normal non-labored breathing , without rales, rhonchi,  wheezing Cardiac: {Desc; regular/irreg:14544} HR, without murmurs {With/Without:20273} carotid bruit*** Abdomen: soft, NT, no masses Skin: {With/Without:20273} rashes Vascular Exam/Pulses:  Extremities: Right lateral heel wound is healed.         Musculoskeletal: no muscle wasting or atrophy  Neurologic: A&O X 3;  No focal weakness or paresthesias are detected Psychiatric:  The pt has {Desc; normal/abnormal:11317::"Normal"} affect.  Non-Invasive Vascular imaging   ABI (03/01/2023) R:  ABI: 0.62 (0.63),  PT: mono DP: tri TBI:  0.36 L:  ABI: 0.9 (0.89),  PT: tri DP: tri TBI: 0.55   LLE Arterial Duplex (03/01/2023) Patent left SFA stent without stenosis.   Medical Decision Making   SHISHIR [name] is a 77 y.o. male who presents for surveillance of PAD  Based on the patient's vascular studies, their ABIs are essentially unchanged since last visit. *** Arterial duplex *** The patient denies any claudication, rest pain, or tissue loss. 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  He has also been developing more bilateral ankle and foot  swelling.  He has a few, small venous ulcers around his ankles. I have recommended keeping these clean and dry.  I think he can wear 15-20 knee high compression stockings and elevate his legs to help these venous ulcers heal. He can follow up with our office in 3 months with LLE arterial duplex and ABIs   Loel Dubonnet PA-C [facility] Office: [phone]  Clinic MD: ***

## 2023-03-02 NOTE — Progress Notes (Signed)
  Electrophysiology Office Note:   Date:  03/03/2023  ID:  [name] [name], DOB 11/04/1945, MRN [id number]  Primary Cardiologist: Bryan Lemma, MD Electrophysiologist: Lewayne Bunting, MD      History of Present Illness:   [name] [name] is a 77 y.o. male with h/o CAD (CABG 2014), PVD (Aorto-bifem bypass 2014, endarterectomy with profundoplasty of his left common femoral artery and proximal left SFA by Dr. Chestine Spore on 05/13/2021 along with left SFA stenting), HTN, HLD, DM, AFib, ICM, ICD, chronic CHF (systolic), COPD, and VT seen today for post hospital follow up.    Admitted 5/30 - 5/31 for inappropriate ICD shocks in the setting of AF RVR. Had planned office visit after device alert for VT/VF zones with ATPs and was felt to be AF RVR, but then went to ED when shocks started.  Sotalol stopped with recurrent AF and mexitil started with a significant PVC burden.  Since discharge from hospital the patient reports doing well. He has had a little confusion per family, but having a lot trouble sleeping. Not very active. He denies chest pain, palpitations, dyspnea, PND, orthopnea, nausea, vomiting, dizziness, syncope, edema, weight gain, or early satiety.  He has chronic orthopnea and fatigue. Wants to get back in with Dr. Excell Seltzer to discuss the possibility of valve surgery.   Review of systems complete and found to be negative unless listed in HPI.   Device information Abbott CRT-D implanted 09/03/2016   AAD hx Amiodarone stopped in 2020 with SOB and abnormal PFTs/ILD Sotalol started Nov 2023 with increasing AF and PVC burden   Studies Reviewed:    ICD Interrogation-  reviewed in detail today,  See PACEART report.  EKG is not ordered today   Physical Exam:   VS:  BP 102/60   Pulse 62   Ht 5\' 6"  (1.676 m)   Wt 123 lb (55.8 kg)   SpO2 98%   BMI 19.85 kg/m    Wt Readings from Last 3 Encounters:  03/03/23 123 lb (55.8 kg)  03/01/23 129 lb (58.5 kg)  01/24/23 127 lb 3.2 oz (57.7 kg)      GEN: Well nourished, well developed in no acute distress NECK: No JVD; No carotid bruits CARDIAC: Regular rate and rhythm, no murmurs, rubs, gallops RESPIRATORY:  Clear to auscultation without rales, wheezing or rhonchi  ABDOMEN: Soft, non-tender, non-distended EXTREMITIES:  No edema; No deformity   ASSESSMENT AND PLAN:    Chronic systolic dysfunction s/p Abbott CRT-D  euvolemic today Stable on an appropriate medical regimen Normal ICD function See Pace Art report No changes today  Paroxysmal AF No good option having failed sotalol Previously failed amio with SOB , abnormal PFTs, and ILD  PVCs NSVT Stable, no further.   CAD No s/s ischemia  Severe MR Pending Structural work up completion as outpatient.   Disposition:   Follow up with Dr. Ladona Ridgel in 3 months  Signed, Graciella Freer, PA-C

## 2023-03-03 ENCOUNTER — Encounter: Payer: Self-pay | Admitting: Student

## 2023-03-03 ENCOUNTER — Ambulatory Visit: Payer: Non-veteran care | Attending: Student | Admitting: Student

## 2023-03-03 VITALS — BP 102/60 | HR 62 | Ht 66.0 in | Wt 123.0 lb

## 2023-03-03 DIAGNOSIS — I25119 Atherosclerotic heart disease of native coronary artery with unspecified angina pectoris: Secondary | ICD-10-CM

## 2023-03-03 DIAGNOSIS — I5022 Chronic systolic (congestive) heart failure: Secondary | ICD-10-CM | POA: Diagnosis not present

## 2023-03-03 DIAGNOSIS — I472 Ventricular tachycardia, unspecified: Secondary | ICD-10-CM

## 2023-03-03 DIAGNOSIS — Z95810 Presence of automatic (implantable) cardiac defibrillator: Secondary | ICD-10-CM

## 2023-03-03 LAB — CUP PACEART INCLINIC DEVICE CHECK
Battery Remaining Longevity: 19 mo
Brady Statistic RA Percent Paced: 1.4 %
Brady Statistic RV Percent Paced: 85 %
Date Time Interrogation Session: 20240613133929
HighPow Impedance: 56.25 Ohm
Implantable Lead Connection Status: 753985
Implantable Lead Connection Status: 753985
Implantable Lead Connection Status: 753985
Implantable Lead Implant Date: 20171215
Implantable Lead Implant Date: 20171215
Implantable Lead Implant Date: 20171215
Implantable Lead Location: 753858
Implantable Lead Location: 753859
Implantable Lead Location: 753860
Implantable Lead Model: 7122
Implantable Pulse Generator Implant Date: 20171215
Lead Channel Impedance Value: 350 Ohm
Lead Channel Impedance Value: 450 Ohm
Lead Channel Impedance Value: 525 Ohm
Lead Channel Pacing Threshold Amplitude: 0.5 V
Lead Channel Pacing Threshold Amplitude: 0.5 V
Lead Channel Pacing Threshold Amplitude: 0.625 V
Lead Channel Pacing Threshold Amplitude: 1 V
Lead Channel Pacing Threshold Pulse Width: 0.5 ms
Lead Channel Pacing Threshold Pulse Width: 0.5 ms
Lead Channel Pacing Threshold Pulse Width: 0.5 ms
Lead Channel Pacing Threshold Pulse Width: 0.5 ms
Lead Channel Sensing Intrinsic Amplitude: 11.7 mV
Lead Channel Sensing Intrinsic Amplitude: 3.4 mV
Lead Channel Setting Pacing Amplitude: 2 V
Lead Channel Setting Pacing Amplitude: 2 V
Lead Channel Setting Pacing Amplitude: 2 V
Lead Channel Setting Pacing Pulse Width: 0.5 ms
Lead Channel Setting Pacing Pulse Width: 0.5 ms
Lead Channel Setting Sensing Sensitivity: 0.5 mV
Pulse Gen Serial Number: 7368976

## 2023-03-03 NOTE — Patient Instructions (Addendum)
Medication Instructions:  Your physician recommends that you continue on your current medications as directed. Please refer to the Current Medication list given to you today.  *If you need a refill on your cardiac medications before your next appointment, please call your pharmacy*  Lab Work: BMET, CBC, Digoxin level-Today If you have labs (blood work) drawn today and your tests are completely normal, you will receive your results only by: MyChart Message (if you have MyChart) OR A paper copy in the mail If you have any lab test that is abnormal or we need to change your treatment, we will call you to review the results.  Follow-Up: At [facility], you and your health needs are our priority.  As part of our continuing mission to provide you with exceptional heart care, we have created designated Provider Care Teams.  These Care Teams include your primary Cardiologist (physician) and Advanced Practice Providers (APPs -  Physician Assistants and Nurse Practitioners) who all work together to provide you with the care you need, when you need it.  Your next appointment:   3 month(s)  Provider:   Lewayne Bunting, MD  Move follow up with Dr Excell Seltzer to a sooner date

## 2023-03-04 ENCOUNTER — Telehealth: Payer: Self-pay

## 2023-03-04 DIAGNOSIS — E875 Hyperkalemia: Secondary | ICD-10-CM

## 2023-03-04 LAB — BASIC METABOLIC PANEL
BUN/Creatinine Ratio: 14 (ref 10–24)
BUN: 10 mg/dL (ref 8–27)
CO2: 26 mmol/L (ref 20–29)
Calcium: 9.5 mg/dL (ref 8.6–10.2)
Chloride: 88 mmol/L — ABNORMAL LOW (ref 96–106)
Creatinine, Ser: 0.72 mg/dL — ABNORMAL LOW (ref 0.76–1.27)
Glucose: 175 mg/dL — ABNORMAL HIGH (ref 70–99)
Potassium: 5.6 mmol/L — ABNORMAL HIGH (ref 3.5–5.2)
Sodium: 128 mmol/L — ABNORMAL LOW (ref 134–144)
eGFR: 95 mL/min/{1.73_m2} (ref >59)

## 2023-03-04 LAB — CBC
Hematocrit: 42.1 % (ref 37.5–51.0)
Hemoglobin: 13.9 g/dL (ref 13.0–17.7)
MCH: 27.9 pg (ref 26.6–33.0)
MCHC: 33 g/dL (ref 31.5–35.7)
MCV: 84 fL (ref 79–97)
Platelets: 296 10*3/uL (ref 150–450)
RBC: 4.99 x10E6/uL (ref 4.14–5.80)
RDW: 13.6 % (ref 11.6–15.4)
WBC: 6.3 10*3/uL (ref 3.4–10.8)

## 2023-03-04 LAB — DIGOXIN LEVEL: Digoxin, Serum: 1.2 ng/mL — ABNORMAL HIGH (ref 0.5–0.9)

## 2023-03-04 NOTE — Telephone Encounter (Signed)
Graciella Freer, PA-C 03/04/2023  8:12 AM EDT Back to Top    As we discussed in office yesterday, digoxin level elevated, but in the setting of having taken it that morning.   Needs Dig level early next week in the morning, PRIOR to him taking a dose of digoxin.   K is also elevated.   Please hold Valsartan x 2 doses and recheck BMET at same time.   Please make sure he is not on any potassium supplementation (none listed), and he should avoid foods high in potassium.   Called patient and spoke with wife on DPR at length. Confirmed that patient does not take any potassium supplements, but after much discussion, almost everything he eats is a high-potassium food. Provided education on what to eat and what to cut back on. She verbalized understanding on holding the Valsartan and will come for labs on Tuesday 6/18 prior to 11:30 when he would usually take his Digoxin. Orders placed for BMET and digoxin level.

## 2023-03-07 ENCOUNTER — Ambulatory Visit: Payer: Medicare Other | Attending: Internal Medicine

## 2023-03-07 DIAGNOSIS — I5022 Chronic systolic (congestive) heart failure: Secondary | ICD-10-CM

## 2023-03-07 DIAGNOSIS — Z95810 Presence of automatic (implantable) cardiac defibrillator: Secondary | ICD-10-CM

## 2023-03-08 ENCOUNTER — Ambulatory Visit: Payer: Medicare Other | Attending: Cardiovascular Disease

## 2023-03-08 DIAGNOSIS — E875 Hyperkalemia: Secondary | ICD-10-CM

## 2023-03-08 LAB — BASIC METABOLIC PANEL
Sodium: 130 mmol/L — ABNORMAL LOW (ref 134–144)
eGFR: 94 mL/min/{1.73_m2} (ref >59)

## 2023-03-09 LAB — BASIC METABOLIC PANEL
BUN/Creatinine Ratio: 12 (ref 10–24)
BUN: 9 mg/dL (ref 8–27)
CO2: 27 mmol/L (ref 20–29)
Calcium: 9.6 mg/dL (ref 8.6–10.2)
Chloride: 91 mmol/L — ABNORMAL LOW (ref 96–106)
Creatinine, Ser: 0.75 mg/dL — ABNORMAL LOW (ref 0.76–1.27)
Glucose: 159 mg/dL — ABNORMAL HIGH (ref 70–99)
Potassium: 4.7 mmol/L (ref 3.5–5.2)

## 2023-03-09 LAB — DIGOXIN LEVEL: Digoxin, Serum: <0.4 ng/mL — ABNORMAL LOW (ref 0.5–0.9)

## 2023-03-09 NOTE — Progress Notes (Signed)
EPIC Encounter for ICM Monitoring  Patient Name: [name] [name] is a 77 y.o. male Date: 03/09/2023 Primary Care Physican: [facility] Primary Cardiologist: Herbie Baltimore Electrophysiologist: Rae Roam Pacing: 94%     08/31/2022 Weight: 127 lbs 10/13/2022 Weight: 130 lbs 01/28/2023 Weight: 125 lbs   AT/AF Burden 0%     Spoke with wife and heart failure questions reviewed.  Transmission results reviewed.  Pt's feet were swollen during decreased impedance and was taking double Lasix which resulted in possible dryness.   He has returned to taking Lasix 1 a day.  He is wearing compression stockings for swelling.     Corvue thoracic impedance suggesting normal fluid levels with the exception of possible fluid accumulation from 5/11-5/21, 5/23-5/27 and 5/31-6/2 and possible dryness 6/4-6/14.   Prescribed:  Furosemide 20 mg Take 1 tablet(s) (20 mg total) by mouth daily.    Aspirin 81 mg daily   Labs: 03/08/2023 Creatinine 0.75, BUN 9,   Potassium 4.7, Sodium 130, GFR 94  03/03/2023 Creatinine 0.72, BUN 10, Potassium 5.6, Sodium 128, GFR 95  02/18/2023 Creatinine 0.92, BUN 16, Potassium 3.6, Sodium 129  11/20/2021 Creatinine 0.86, BUN 8,   Potassium 5.2, Sodium 131, GFR 91 11/11/2021 Creatinine 0.88, BUN 10, Potassium 5.5, Sodium 128, GFR 91 10/07/2021 Creatinine 0.86, BUN 8,   Potassium 5.3, Sodium 131, GFR 91 A complete set of results can be found in Results Review.   Recommendations:   Advised to call office if any changes in condition.     Follow-up plan: ICM clinic phone appointment on 04/11/2023.   91 day device clinic remote transmission 04/15/2023.     EP/Cardiology Office Visits:  03/10/2023 with Dr Excell Seltzer.   Recall 05/22/2023 with Dr Ladona Ridgel.   Copy of ICM check sent to Dr. Ladona Ridgel.    3 month ICM trend: 03/07/2023.    12-14 Month ICM trend:     Karie Soda, RN 03/09/2023 1:19 PM

## 2023-03-10 ENCOUNTER — Encounter: Payer: Self-pay | Admitting: Cardiovascular Disease

## 2023-03-10 ENCOUNTER — Ambulatory Visit: Payer: Medicare Other | Attending: Cardiovascular Disease | Admitting: Cardiovascular Disease

## 2023-03-10 VITALS — BP 124/72 | HR 68 | Ht 66.0 in | Wt 126.0 lb

## 2023-03-10 DIAGNOSIS — I5022 Chronic systolic (congestive) heart failure: Secondary | ICD-10-CM | POA: Insufficient documentation

## 2023-03-10 DIAGNOSIS — I340 Nonrheumatic mitral (valve) insufficiency: Secondary | ICD-10-CM | POA: Insufficient documentation

## 2023-03-10 LAB — CBC
Hematocrit: 38.2 % (ref 37.5–51.0)
Hemoglobin: 13 g/dL (ref 13.0–17.7)
MCH: 28.3 pg (ref 26.6–33.0)
MCHC: 34 g/dL (ref 31.5–35.7)
MCV: 83 fL (ref 79–97)
Platelets: 298 10*3/uL (ref 150–450)
RBC: 4.6 x10E6/uL (ref 4.14–5.80)
RDW: 15.4 % (ref 11.6–15.4)
WBC: 7.4 10*3/uL (ref 3.4–10.8)

## 2023-03-10 NOTE — H&P (View-Only) (Signed)
Cardiology Office Note:    Date:  03/10/2023   ID:  [name] [name], DOB 09/19/1946, MRN [id number]  PCP:  [facility]   [facility] Providers Cardiologist:  David Harding, MD Electrophysiologist:  Gregg Taylor, MD     Referring MD: [facility]   Chief Complaint  Patient presents with   Shortness of Breath    History of Present Illness:    [name] [name] is a 77 y.o. male returning today for follow-up of severe mitral regurgitation.  I just saw him Jan 24, 2023 and initial consultation for management of severe mitral regurgitation.  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  At the time of his initial consult, he was noted to have multiple comorbidities.  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  At the time, he was not inclined to proceed with further evaluation and treatment and I had him scheduled for 6-month follow-up.  Since his visit last month, he had ICD discharges requiring hospitalization.  In reviewing the notes, it appears he was having atrial fibrillation causing ICD discharges.  Sotalol was discontinued and he was started on digoxin.  He is only taking this Monday through Friday.  He called back to seek further treatment for mitral regurgitation as he thought more about it and would now like to proceed with evaluation for transcatheter edge-to-edge repair of the mitral valve.  The patient is doing somewhat better but continues to be quite limited.  He can only ambulate very short distances and he is mostly in a motorized scooter.  He is short of breath with physical activity.  He is had no recent chest pain or pressure.  He does admit to orthopnea.  He has mild leg edema.  No other complaints reported.  Past Medical History:  Diagnosis Date   Acid reflux    AICD (automatic cardioverter/defibrillator) present    a.  08/2016 St. Jude (serial Number [id number]) biventricular ICD.   Anemia    Chronic systolic CHF (congestive heart failure) (HCC)    a. 08/2016 Echo: EF 20%, diffuse HK, inf, post AK, mod-sev MR, sev dil LA, mod TR, PASP 59mmHg.   Complication of anesthesia    "was told I responded well to anesthesia"   COPD (chronic obstructive pulmonary disease) (HCC)    Coronary artery disease involving native coronary artery of native heart with angina pectoris (HCC)    a. 10/2012 MI after aortobifem bypass, found to have multivessel CAD -->CABG x3;  b. 08/2016 Cath: LM 60, LAD 99m, LCX 60ost/p, RCA 100p, VG->RPDA 50p, VG->OM1 ok, LIMA->LAD ok, EF 25%.   Essential hypertension 03/03/2012   History of blood transfusion 11/2012   "during his CABG"   History of gout X 1   History of stomach ulcers 1970s   Hyperlipidemia associated with type 2 diabetes mellitus (HCC) 09/16/2011   Ischemic cardiomyopathy    a. 08/2016 Echo: EF 20%, diffuse HK, inf, post AK;  b. 08/2016 St. Jude (serial Number [id number]) biventricular ICD.   Myocardial infarction (HCC)    PAD (peripheral artery disease) (HCC)    s/p aortobifemoral bypass 10/2012   Peripheral neuropathy    PTSD (post-traumatic stress disorder)    Type II diabetes mellitus (HCC)    Ventricular tachycardia (HCC)    a. 08/2016 St. Jude (serial Number [id number]) biventricular ICD-->oral amio added.   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 Is now on chronic suppressive antibiotics with dicloxacillin    Past Surgical History:  Procedure Laterality Date   ABDOMINAL AORTOGRAM W/LOWER EXTREMITY Left 05/07/2021   Procedure: ABDOMINAL AORTOGRAM W/LOWER EXTREMITY;  Surgeon: Hawken, Thomas N, MD;  Location: [facility];  Service: Cardiovascular;  Laterality: Left;   AORTO-FEMORAL BYPASS GRAFT Bilateral 10/2012   [facility]   CARDIAC  CATHETERIZATION  04/03/2013   [facility] (? for abnormal Nuclear ST) --> no PCI, by report, patent grafts.   CARDIAC CATHETERIZATION N/A 09/02/2016   Procedure: Left Heart Cath and Cors/Grafts Angiography;  Surgeon: Christopher D McAlhany, MD;  Location: [facility]:: Ost LM 60% -> mid LAD 99% subCTO, ost-prox LCx 60%; prox RCA 100% CTO w/ (small) SVG->RPDA prox 50%; SVG->OM1 ok, LIMA->mLAD ok, EF 25% - complicated by VT during access - Shock x 2, Lidocaine & Amiodarone   CARDIAC CATHETERIZATION  11/09/2012   [facility]: due to Sustained VT post-Ob Ao-BiFem Bypass. (no Angina)   CHOLECYSTECTOMY  03/05/2012   Procedure: LAPAROSCOPIC CHOLECYSTECTOMY WITH INTRAOPERATIVE CHOLANGIOGRAM;  Surgeon: Matthew Wakefield, MD;  Location: [facility];  Service: General;  Laterality: N/A;   CORONARY ARTERY BYPASS GRAFT  11/2012   [facility]: CABG X 3 -> LIMA-mLAD, SVG-highOM1, SVG-RPDA (dRCA).   ENDARTERECTOMY FEMORAL Left 05/13/2021   Procedure: Left Femoral Endarterectomy WITH BOVINE PATCH PROFUNDOPLASTY, REVISION OF LEFT LIMB OF AORTO-BIFEMORAL GRAFT WITH DACRON GRAFT;  Surgeon: Clark, Christopher J, MD;  Location: [facility];  Service: Vascular;  Laterality: Left;   EP IMPLANTABLE DEVICE N/A 09/03/2016   Procedure: BI-VENTRICULAR IMPLANTABLE CARDIOVERTER DEFIBRILLATOR  (CRT-D);  Surgeon: Gregg W Taylor, MD;  [facility];  Service: Cardiovascular;  Laterality: N/A;   INGUINAL HERNIA REPAIR Left 1990s   INSERTION OF ILIAC STENT Left 05/13/2021   Procedure: INSERTION OF SUPERFICIAL FEMORAL ARTERY STENT TIMES THREE AND ANGIOPLASTY;  Surgeon: Clark, Christopher J, MD;  Location: [facility];  Service: Vascular;  Laterality: Left;   INTRAOPERATIVE ARTERIOGRAM Left 05/13/2021   Procedure: INTRA OPERATIVE ARTERIOGRAM OF LEFT LOWER EXTREMITY;  Surgeon: Clark, Christopher J, MD;  Location: [facility];  Service: Vascular;  Laterality: Left;   NM MYOVIEW LTD  10/05/2022   EF ~28%.  Severely reduced function.  High risk  due to decreased EF.  Large fixed severe defect in the mid to basal inferior and inferolateral wall with associated wall motion abnormality consistent with prior infarction.  No evidence of reversible defect/ischemia. =? Echo EF 40-45% with mid to apical anterior/inferolateral akinesis and inferior akinesis.   STERNOTOMY  09/2013   ([facility]): Sternal Wound Infection -> re-do sternotomy with metal place "sheild" placed. -Has chronic staph infection, on chronic suppressive medication   TRANSTHORACIC ECHOCARDIOGRAM  10/05/2022   EF 40 to 45%.  Basal to mid anterolateral and inferolateral akinesis with basal inferior akinesis.  GR 2 DD.  Severely elevated PAP with mild RV dilation.  RV P estimated 67 mmHg).  Severe LA dilation.  Mild to moderate RA dilation.  Mild to moderate TR.  Severe MR likely with restricted posterior leaflet and inferolateral wall motion.  Moderate AOV calcification with no stenosis.  Mild AI.   TRANSTHORACIC ECHOCARDIOGRAM  01/2021   EF 25 to 30%.  Severely depressed EF.  Global HK with worst basal to mid anterolateral and inferolateral as well as anterior basal inferior walls.  GR 1 DD.  Mildly reduced RV   function.  Functionally bicuspid aortic valve with moderate calcification but no stenosis.  Overall slightly improved EF.   UMBILICAL HERNIA REPAIR  03/05/2012   Procedure: HERNIA REPAIR UMBILICAL ADULT;  Surgeon: Matthew Wakefield, MD;  Location: [facility];  Service: General;  Laterality: N/A;    Current Medications: Current Meds  Medication Sig   albuterol (VENTOLIN HFA) 108 (90 Base) MCG/ACT inhaler Inhale 1-2 puffs into the lungs every 6 (six) hours as needed for wheezing or shortness of breath.   Alpha-Lipoic Acid 600 MG TABS Take 600 mg by mouth daily.   apixaban (ELIQUIS) 5 MG TABS tablet Take 1 tablet (5 mg total) by mouth 2 (two) times daily.   aspirin EC 81 MG tablet Take 81 mg by mouth daily.   atorvastatin (LIPITOR) 80 MG tablet Take 40 mg by mouth every evening.    bacitracin ointment Apply 1 Application topically 2 (two) times daily.   carvedilol (COREG) 3.125 MG tablet Take 1 tablet (3.125 mg total) by mouth 2 (two) times daily.   cholecalciferol (VITAMIN D) 25 MCG (1000 UNIT) tablet Take 2,000 Units by mouth every evening.   dicloxacillin (DYNAPEN) 250 MG capsule Take 2 capsules (500 mg total) by mouth 3 (three) times daily.   digoxin (LANOXIN) 0.125 MG tablet Take 1 tablet (0.125 mg total) by mouth daily. Monday through Friday only, None on Saturday and Sunday   fexofenadine (ALLEGRA) 180 MG tablet Take 180 mg by mouth daily.   fluticasone (FLONASE) 50 MCG/ACT nasal spray Place 2 sprays into both nostrils daily as needed for allergies or rhinitis.   furosemide (LASIX) 20 MG tablet Take 20 mg by mouth.   glipiZIDE (GLUCOTROL) 5 MG tablet Take 5 mg by mouth 2 (two) times daily before a meal.   lidocaine (LIDODERM) 5 % Place 1 patch onto the skin daily as needed (For pain).   magnesium oxide (MAG-OX) 400 (241.3 Mg) MG tablet Take 1 tablet (400 mg total) by mouth daily.   metFORMIN (GLUCOPHAGE) 1000 MG tablet Take 1 tablet (1,000 mg total) by mouth 2 (two) times daily with a meal. Resume on 09/06/2016   mexiletine (MEXITIL) 150 MG capsule Take 1 capsule (150 mg total) by mouth every 12 (twelve) hours.   nitroGLYCERIN (NITROSTAT) 0.4 MG SL tablet Place 1 tablet (0.4 mg total) under the tongue every 5 (five) minutes x 3 doses as needed for chest pain.   Nutritional Supplements (ENSURE PLUS PO) Take by mouth.   pantoprazole (PROTONIX) 40 MG tablet Take 1 tablet (40 mg total) by mouth daily.   Tiotropium Bromide Monohydrate (SPIRIVA RESPIMAT) 2.5 MCG/ACT AERS Inhale 2 puffs into the lungs at bedtime.     Allergies:   Lisinopril   Social History   Socioeconomic History   Marital status: Married    Spouse name: Not on file   Number of children: Not on file   Years of education: Not on file   Highest education level: Not on file  Occupational History    Not on file  Tobacco Use   Smoking status: Every Day    Types: Pipe    Passive exposure: Never   Smokeless tobacco: Former    Types: Snuff, Chew   Tobacco comments:    09/03/2016 "used smokeless tobacco in my teens; quit smoking cigarettes in the early 1980s; smokes pipe only"  Vaping Use   Vaping Use: Never used  Substance and Sexual Activity   Alcohol use: No   Drug use: No   Sexual activity:   Not on file  Other Topics Concern   Not on file  Social History Narrative   Routine activity around the house, but is limited more by musculoskeletal leg pain and weakness.      He enjoys smoking a pipe-long ago, he quit cigarettes   Social Determinants of Health   Financial Resource Strain: Not on file  Food Insecurity: No Food Insecurity (02/18/2023)   Hunger Vital Sign    Worried About Running Out of Food in the Last Year: Never true    Ran Out of Food in the Last Year: Never true  Transportation Needs: No Transportation Needs (02/18/2023)   PRAPARE - Transportation    Lack of Transportation (Medical): No    Lack of Transportation (Non-Medical): No  Physical Activity: Not on file  Stress: Not on file  Social Connections: Not on file     Family History: The patient's family history includes Cancer in his brother.  ROS:   Please see the history of present illness.    All other systems reviewed and are negative.  EKGs/Labs/Other Studies Reviewed:    The following studies were reviewed today: Cardiac Studies & Procedures   CARDIAC CATHETERIZATION  CARDIAC CATHETERIZATION 09/02/2016  Narrative  There is severe left ventricular systolic dysfunction.  LV end diastolic pressure is severely elevated.  The left ventricular ejection fraction is less than 25% by visual estimate.  There is no mitral valve regurgitation.  Prox RCA lesion, 100 %stenosed.  SVG graft was visualized by angiography.  Origin to Prox Graft lesion, 50 %stenosed.  Ost Cx to Prox Cx lesion, 60  %stenosed.  Ost LM to LM lesion, 60 %stenosed.  SVG graft was visualized by angiography.  LIMA graft was visualized by angiography.  Mid LAD lesion, 99 %stenosed.  1. Severe triple vessel CAD s/p 3V CABG 2. Moderate distal left main stenosis 3. Subtotal occlusion of the mid LAD. Patent LIMA graft to mid LAD 4. Patent Circumflex system. Patent vein graft to the high obtuse marginal. This fills the entire lateral wall Circumflex system and some of the LAD. Of note, there is also antegrade flow into the Circumflex from injection of the left main. 5. The RCA is occluded proximally. The vein graft to the distal RCA is patent. The vein graft is small in caliber with no flow limiting stenoses. 6. Severe LV systolic dysfunction, LVEF=10-15%. 7. Wide complex tachycardia. This began after access in the left radial artery. As discussed in the narrative, we attempted DCCV x 2, bolus of amiodarone x 2, adenosine. This eventually converted to sinus with IV Lidocaine.  Recommendations: Medical management of CAD. EP to see to discuss wide complex tachycardia.  Findings Coronary Findings Diagnostic  Dominance: Right  Left Main  Left Anterior Descending  First Diagonal Branch Vessel is large in size.  Second Diagonal Branch Vessel is small in size.  Third Diagonal Branch Vessel is small in size.  Left Circumflex  First Obtuse Marginal Branch Vessel is large in size.  Second Obtuse Marginal Branch Vessel is moderate in size.  Third Obtuse Marginal Branch Vessel is moderate in size.  Right Coronary Artery The lesion is chronically occluded.  Saphenous Graft To RPDA SVG graft was visualized by angiography. The entire graft is small. There is a proximal stenosis followed by dilation of the vein graft (likely at a valve). The remainder of the graft is almost the same size as the proximal body.  Saphenous Graft To 1st Mrg SVG graft was visualized by angiography.    LIMA LIMA Graft To  Mid LAD LIMA graft was visualized by angiography.  Intervention  No interventions have been documented.   STRESS TESTS  MYOCARDIAL PERFUSION IMAGING 10/05/2022  Narrative   Findings are consistent with infarction. The study is high risk due to severely depressed ejection fraction.   No ST deviation was noted.   LV perfusion is abnormal. Defect 1: There is a large defect with severe reduction in uptake present in the mid to basal inferior and inferolateral location(s) that is fixed. There is abnormal wall motion in the defect area. Consistent with infarction.   Left ventricular function is abnormal. Global function is severely reduced. Nuclear stress EF: 28 %. The left ventricular ejection fraction is severely decreased (<30%). End diastolic cavity size is severely enlarged. End systolic cavity size is severely enlarged.   Prior study not available for comparison.   ECHOCARDIOGRAM  ECHOCARDIOGRAM COMPLETE 10/05/2022  Narrative ECHOCARDIOGRAM REPORT    Patient Name:   [name] [name] Date of Exam: 10/05/2022 Medical Rec #:  [id number]       Height:       66.0 in Accession #:    [id number]      Weight:       135.0 lb Date of Birth:  03/25/1946        BSA:          1.692 m Patient Age:    76 years        BP:           128/64 mmHg Patient Gender: M               HR:           60 bpm. Exam Location:  [address]  Procedure: 2D Echo, Cardiac Doppler and Color Doppler  Indications:    R01.1 Murmur I42.9 Cardiomyopathy  History:        Patient has prior history of Echocardiogram examinations, most recent 02/05/2021. Cardiomyopathy, Previous Myocardial Infarction and CAD, Defibrillator; Risk Factors:Diabetes and Dyslipidemia. Chronic systolic heart failure. COPD.  Sonographer:    NaTashia Rodgers-Jones RDCS Referring Phys: [id number] DAVID W HARDING  IMPRESSIONS   1. Left ventricular ejection fraction, by estimation, is 40 to 45%. The left ventricle has mildly decreased function. The  left ventricle demonstrates regional wall motion abnormalities with basal to mid anterolateral and inferolateral akinesis and basal inferior akinesis. The left ventricular internal cavity size was mildly dilated. There is mild concentric left ventricular hypertrophy. Left ventricular diastolic parameters are consistent with Grade II diastolic dysfunction (pseudonormalization). 2. Right ventricular systolic function is moderately reduced. The right ventricular size is mildly enlarged. There is severely elevated pulmonary artery systolic pressure. The estimated right ventricular systolic pressure is 66.7 mmHg. 3. Left atrial size was severely dilated. 4. Right atrial size was mild to moderately dilated. 5. The mitral valve is abnormal. Severe mitral valve regurgitation. 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 No evidence of mitral stenosis. Moderate mitral annular calcification. 6. Tricuspid valve regurgitation is mild to moderate. 7. The aortic valve is tricuspid. There is moderate calcification of the aortic valve. Aortic valve regurgitation is mild. No aortic stenosis is present. 8. The inferior vena cava is normal in size with <50% respiratory variability, suggesting right atrial pressure of 8 mmHg.  FINDINGS Left Ventricle: Left ventricular ejection fraction, by estimation, is 40 to 45%. The left ventricle has mildly decreased function. The left ventricle demonstrates regional wall motion abnormalities.   The left ventricular internal cavity size was mildly dilated. There is mild concentric left ventricular hypertrophy. Left ventricular diastolic parameters are consistent with Grade II diastolic dysfunction (pseudonormalization).  Right Ventricle: The right ventricular size is mildly enlarged. No increase in right ventricular wall thickness. Right ventricular systolic function is moderately reduced. There is  severely elevated pulmonary artery systolic pressure. The tricuspid regurgitant velocity is 3.83 m/s, and with an assumed right atrial pressure of 8 mmHg, the estimated right ventricular systolic pressure is 66.7 mmHg.  Left Atrium: Left atrial size was severely dilated.  Right Atrium: Right atrial size was mild to moderately dilated.  Pericardium: There is no evidence of pericardial effusion.  Mitral Valve: The mitral valve is abnormal. There is moderate calcification of the mitral valve leaflet(s). Moderate mitral annular calcification. Severe mitral valve regurgitation. No evidence of mitral valve stenosis.  Tricuspid Valve: The tricuspid valve is normal in structure. Tricuspid valve regurgitation is mild to moderate.  Aortic Valve: The aortic valve is tricuspid. There is moderate calcification of the aortic valve. Aortic valve regurgitation is mild. Aortic regurgitation PHT measures 554 msec. No aortic stenosis is present. Aortic valve mean gradient measures 7.4 mmHg. Aortic valve peak gradient measures 13.8 mmHg. Aortic valve area, by VTI measures 0.91 cm.  Pulmonic Valve: The pulmonic valve was normal in structure. Pulmonic valve regurgitation is not visualized.  Aorta: The aortic root is normal in size and structure.  Venous: The inferior vena cava is normal in size with less than 50% respiratory variability, suggesting right atrial pressure of 8 mmHg.  IAS/Shunts: No atrial level shunt detected by color flow Doppler.  Additional Comments: A device lead is visualized in the right ventricle.   LEFT VENTRICLE PLAX 2D LVIDd:         6.30 cm   Diastology LVIDs:         4.80 cm   LV e' medial:    4.51 cm/s LV PW:         0.90 cm   LV E/e' medial:  23.9 LV IVS:        1.00 cm   LV e' lateral:   4.89 cm/s LVOT diam:     2.00 cm   LV E/e' lateral: 22.1 LV SV:         33 LV SV Index:   19 LVOT Area:     3.14 cm   RIGHT VENTRICLE            IVC RV Basal diam:  4.50 cm    IVC  diam: 1.70 cm RV S prime:     8.56 cm/s TAPSE (M-mode): 2.0 cm  LEFT ATRIUM              Index        RIGHT ATRIUM           Index LA diam:        5.50 cm  3.25 cm/m   RA Area:     20.50 cm LA Vol (A2C):   135.0 ml 79.78 ml/m  RA Volume:   64.40 ml  38.06 ml/m LA Vol (A4C):   114.0 ml 67.37 ml/m LA Biplane Vol: 125.0 ml 73.87 ml/m AORTIC VALVE AV Area (Vmax):    0.90 cm AV Area (Vmean):   0.86 cm AV Area (VTI):     0.91 cm AV Vmax:           185.80 cm/s AV Vmean:            122.200 cm/s AV VTI:            0.357 m AV Peak Grad:      13.8 mmHg AV Mean Grad:      7.4 mmHg LVOT Vmax:         52.95 cm/s LVOT Vmean:        33.550 cm/s LVOT VTI:          0.104 m LVOT/AV VTI ratio: 0.29 AI PHT:            554 msec  AORTA Ao Root diam: 3.20 cm  MITRAL VALVE                TRICUSPID VALVE MV Area (PHT): 3.27 cm     TR Peak grad:   58.7 mmHg MV Decel Time: 232 msec     TR Vmax:        383.00 cm/s MR Peak grad: 102.6 mmHg MR Mean grad: 67.5 mmHg     SHUNTS MR Vmax:      506.50 cm/s   Systemic VTI:  0.10 m MR Vmean:     390.0 cm/s    Systemic Diam: 2.00 cm MV E velocity: 108.00 cm/s MV A velocity: 55.00 cm/s MV E/A ratio:  1.96  Dalton McleanMD Electronically signed by Dalton McleanMD Signature Date/Time: 10/05/2022/5:41:30 PM    Final                  Recent Labs: 04/30/2022: Magnesium 2.1 03/08/2023: BUN 9; Creatinine, Ser 0.75; Potassium 4.7; Sodium 130 03/10/2023: Hemoglobin 13.0; Platelets 298  Recent Lipid Panel    Component Value Date/Time   CHOL 115 10/07/2021 1408   TRIG 91 10/07/2021 1408   HDL 35 (L) 10/07/2021 1408   CHOLHDL 3.3 10/07/2021 1408   CHOLHDL 5.0 05/14/2021 0335   VLDL 22 05/14/2021 0335   LDLCALC 62 10/07/2021 1408     Risk Assessment/Calculations:    CHA2DS2-VASc Score =     This indicates a  % annual risk of stroke. The patient's score is based upon:                 Physical Exam:    VS:  BP 124/72   Pulse 68   Ht 5'  6" (1.676 m)   Wt 126 lb (57.2 kg) Comment: PT REPORTED  SpO2 98%   BMI 20.34 kg/m     Wt Readings from Last 3 Encounters:  03/10/23 126 lb (57.2 kg)  03/03/23 123 lb (55.8 kg)  03/01/23 129 lb (58.5 kg)     GEN: Elderly male, in a motorized scooter, in no acute distress HEENT: Normal NECK: No JVD; No carotid bruits LYMPHATICS: No lymphadenopathy CARDIAC: RRR, 3/6 holosystolic murmur at the apex RESPIRATORY:  Clear to auscultation without rales, wheezing or rhonchi  ABDOMEN: Soft, non-tender, non-distended MUSCULOSKELETAL: 1+ bilateral pretibial edema; No deformity  SKIN: Warm and dry NEUROLOGIC:  Alert and oriented x 3 PSYCHIATRIC:  Normal affect   ASSESSMENT:    1. Chronic HFrEF (heart failure with reduced ejection fraction) (HCC)   2. Nonrheumatic mitral valve regurgitation    PLAN:    In order of problems listed above:  The patient has severe ischemic mitral regurgitation.  I reviewed his most recent transthoracic echo which is notable for an LVEF of 40 to 45% with regional wall motion abnormalities involving the inferolateral walls.  There is severe mitral regurgitation with apparent restriction of the posterior mitral valve leaflet.  There is also severe pulmonary   hypertension and mild RV dysfunction noted.  The patient has New York Heart Association functional class III symptoms of chronic systolic heart failure complicated by mitral regurgitation.  We discussed the natural history of ischemic mitral regurgitation today.  We discussed potential treatment options including continued medical management versus transcatheter edge-to-edge repair of the mitral valve.  He understands that further evaluation is necessary if he would like to pursue transcatheter edge-to-edge repair.  He will need to undergo a transesophageal echo as well as a right and left heart catheterization.  The patient would like to pursue this.  We will schedule these procedures to be done on the same day if  possible. I have reviewed the risks, indications, and alternatives to cardiac catheterization, possible angioplasty, and stenting with the patient. Risks include but are not limited to bleeding, infection, vascular injury, stroke, myocardial infection, arrhythmia, kidney injury, radiation-related injury in the case of prolonged fluoroscopy use, emergency cardiac surgery, and death. The patient understands the risks of serious complication is 1-2 in 1000 with diagnostic cardiac cath and 1-2% or less with angioplasty/stenting.  The patient will also be referred for formal heart failure consultation as part of a multidisciplinary approach to his care.  If he has suitable anatomy for transcatheter edge-to-edge repair of the mitral valve and his medical therapy can be optimized, it would be appropriate to consider him for the procedure.  Apixaban will need to be held for his heart catheterization per protocol.  His left radial pulse is palpable and hopefully his catheterization can be done via left radial access.      Informed Consent   Shared Decision Making/Informed Consent The risks [esophageal damage, perforation (1:10,000 risk), bleeding, pharyngeal hematoma as well as other potential complications associated with conscious sedation including aspiration, arrhythmia, respiratory failure and death], benefits (treatment guidance and diagnostic support) and alternatives of a transesophageal echocardiogram were discussed in detail with Mr. Chapa and he is willing to proceed.        Medication Adjustments/Labs and Tests Ordered: Current medicines are reviewed at length with the patient today.  Concerns regarding medicines are outlined above.  Orders Placed This Encounter  Procedures   CBC   AMB referral to CHF clinic   No orders of the defined types were placed in this encounter.   Patient Instructions  Medication Instructions:  Your physician has recommended you make the following change in your  medication:   Continue to hold Valsartan   *If you need a refill on your cardiac medications before your next appointment, please call your pharmacy*   Lab Work: CBC If you have labs (blood work) drawn today and your tests are completely normal, you will receive your results only by: MyChart Message (if you have MyChart) OR A paper copy in the mail If you have any lab test that is abnormal or we need to change your treatment, we will call you to review the results.   Testing/Procedures: Your physician has requested that you have a TEE. During a TEE, sound waves are used to create images of your heart. It provides your doctor with information about the size and shape of your heart and how well your heart's chambers and valves are working. In this test, a transducer is attached to the end of a flexible tube that's guided down your throat and into your esophagus (the tube leading from you mouth to your stomach) to get a more detailed image of your heart. You are not awake for the procedure. Please   see the instruction sheet given to you today. For further information please visit [email or web address].    Your physician has requested that you have a cardiac catheterization. Cardiac catheterization is used to diagnose and/or treat various heart conditions. Doctors may recommend this procedure for a number of different reasons. The most common reason is to evaluate chest pain. Chest pain can be a symptom of coronary artery disease (CAD), and cardiac catheterization can show whether plaque is narrowing or blocking your heart's arteries. This procedure is also used to evaluate the valves, as well as measure the blood flow and oxygen levels in different parts of your heart. For further information please visit [email or web address]. Please follow instruction sheet, as given.    Follow-Up: At [facility], you and your health needs are our priority.  As part of our continuing mission to provide you  with exceptional heart care, we have created designated Provider Care Teams.  These Care Teams include your primary Cardiologist (physician) and Advanced Practice Providers (APPs -  Physician Assistants and Nurse Practitioners) who all work together to provide you with the care you need, when you need it.    Your next appointment:   As directed following procedure on July 5  Provider:   Dr Ebony Yorio  Other Instructions    Dear Walker C Lasecki  You are scheduled for a TEE (Transesophageal Echocardiogram) on Friday, July 5 with Dr. Chandrasekhra.  Please arrive at the North Tower (Main Entrance A) at [facility]: [address] [place], [place] [address] at 6:30 AM (This time is 1.5 hour(s) before your procedure to ensure your preparation). Free valet parking service is available. You will check in at ADMITTING. The support person will be asked to wait in the waiting room.  It is OK to have someone drop you off and come back when you are ready to be discharged.     DIET:  Nothing to eat or drink after midnight except a sip of water with medications (see medication instructions below)  MEDICATION INSTRUCTIONS: !!IF ANY NEW MEDICATIONS ARE STARTED AFTER TODAY, PLEASE NOTIFY YOUR PROVIDER AS SOON AS POSSIBLE!!  FYI: Medications such as Semaglutide (Ozempic, Wegovy), Tirzepatide (Mounjaro, Zepbound), Dulaglutide (Trulicity), etc ("GLP1 agonists") AND Canagliflozin (Invokana), Dapagliflozin (Farxiga), Empagliflozin (Jardiance), Ertugliflozin (Steglatro), Bexagliflozin (Brenzavvy) or any combination with one of these drugs such as Invokamet (Canagliflozin/Metformin), Synjardy (Empagliflozin/Metformin), etc ("SGLT2 inhibitors") must be held around the time of a procedure. This is not a comprehensive list of all of these drugs. Please review all of your medications and talk to your provider if you take any one of these. If you are not sure, ask your provider.        :1}Continue taking your  anticoagulant (blood thinner): Apixaban (Eliquis).  You will need to continue this after your procedure until you are told by your provider that it is safe to stop.    LABS: today  FYI:  For your safety, and to allow us to monitor your vital signs accurately during the surgery/procedure we request: If you have artificial nails, gel coating, SNS etc, please have those removed prior to your surgery/procedure. Not having the nail coverings /polish removed may result in cancellation or delay of your surgery/procedure.  You must have a responsible person to drive you home and stay in the waiting area during your procedure. Failure to do so could result in cancellation.  Bring your insurance cards.  *Special Note: Every effort is made to have your procedure done on time.   Occasionally there are emergencies that occur at the hospital that may cause delays. Please be patient if a delay does occur.     [facility] A DEPT OF [facility] [address], [id number] [place] [address] Dept: [phone] Loc: [phone]  Teresa C Emory  03/10/2023  You are scheduled for a Cardiac Catheterization on Friday, July 5 with Dr. David Harding.  1. Please arrive at the North Tower (Main Entrance A) at [facility]: [address] [place], [place] [address] at 6:30 AM   Special note: Every effort is made to have your procedure done on time. Please understand that emergencies sometimes delay scheduled procedures.  2. Diet: Do not eat solid foods after midnight.  The patient may have clear liquids until 5am upon the day of the procedure.  3. Labs: TODAY  4. Medication instructions in preparation for your procedure:   Contrast Allergy: No   Stop taking Eliquis (Apixiban) on Wednesday, July 3.  Stop taking, Lasix (Furosemide)  Friday, July 5,    Do not take Diabetes Med Glucophage (Metformin) on the  day of the procedure and HOLD 48 HOURS AFTER THE PROCEDURE.  Hold Glipizide the day of procedure   On the morning of your procedure, take your Aspirin 81 mg and any morning medicines NOT listed above.  You may use sips of water.  5. Plan to go home the same day, you will only stay overnight if medically necessary. 6. Bring a current list of your medications and current insurance cards. 7. You MUST have a responsible person to drive you home. 8. Someone MUST be with you the first 24 hours after you arrive home or your discharge will be delayed. 9. Please wear clothes that are easy to get on and off and wear slip-on shoes.  Thank you for allowing us to care for you!   -- [facility]       Signed, Kimimila Tauzin, MD  03/10/2023 4:47 PM    [facility] 

## 2023-03-10 NOTE — Progress Notes (Addendum)
Cardiology Office Note:    Date:  03/10/2023   ID:  [name] [name], DOB April 03, 1946, MRN [id number]  PCP:  Clinic, [facility] Providers Cardiologist:  Bryan Lemma, MD Electrophysiologist:  Lewayne Bunting, MD     Referring MD: [facility], [facility]   Chief Complaint  Patient presents with   Shortness of Breath   History of Present Illness:    [name] [name] is a 77 y.o. male returning today for follow-up of severe mitral regurgitation.  I just saw him Jan 24, 2023 and initial consultation for management of severe mitral regurgitation.  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  At the time of his initial consult, he was noted to have multiple comorbidities.  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  At the time, he was not inclined to proceed with further evaluation and treatment and I had him scheduled for 56-month follow-up.  Since his visit last month, he had ICD discharges requiring hospitalization.  In reviewing the notes, it appears he was having atrial fibrillation causing ICD discharges.  Sotalol was discontinued and he was started on digoxin.  He is only taking this Monday through Friday.  He called back to seek further treatment for mitral regurgitation as he thought more about it and would now like to proceed with evaluation for transcatheter edge-to-edge repair of the mitral valve.  The patient is doing somewhat better but continues to be quite limited.  He can only ambulate very short distances and he is mostly in a motorized scooter.  He is short of breath with physical activity.  He is had no recent chest pain or pressure.  He does admit to orthopnea.  He has mild leg edema.  No other complaints reported.  Past Medical History:  Diagnosis Date   Acid reflux    AICD (automatic cardioverter/defibrillator) present    a.  08/2016 St. Jude (serial Number [id number]) biventricular ICD.   Anemia    Chronic systolic CHF (congestive heart failure) (HCC)    a. 08/2016 Echo: EF 20%, diffuse HK, inf, post AK, mod-sev MR, sev dil LA, mod TR, PASP .   Complication of anesthesia    "was told I responded well to anesthesia"   COPD (chronic obstructive pulmonary disease) (HCC)    Coronary artery disease involving native coronary artery of native heart with angina pectoris (HCC)    a. 10/2012 MI after aortobifem bypass, found to have multivessel CAD -->CABG x3;  b. 08/2016 Cath: LM 60, LAD 56m, LCX 60ost/p, RCA 100p, VG->RPDA 50p, VG->OM1 ok, LIMA->LAD ok, EF 25%.   Essential hypertension 03/03/2012   History of blood transfusion 11/2012   "during his CABG"   History of gout X 1   History of stomach ulcers 1970s   Hyperlipidemia associated with type 2 diabetes mellitus (HCC) 09/16/2011   Ischemic cardiomyopathy    a. 08/2016 Echo: EF 20%, diffuse HK, inf, post AK;  b. 08/2016 St. Jude (serial Number [id number]) biventricular ICD.   Myocardial infarction [facility])    PAD (peripheral artery disease) (HCC)    s/p aortobifemoral bypass 10/2012   Peripheral neuropathy    PTSD (post-traumatic stress disorder)    Type II diabetes mellitus (HCC)    Ventricular tachycardia (HCC)    a. 08/2016 St. Jude (serial Number [id number]) biventricular ICD-->oral amio added.   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 Is now on chronic suppressive antibiotics with dicloxacillin    Past Surgical History:  Procedure Laterality Date   ABDOMINAL AORTOGRAM W/LOWER EXTREMITY Left 05/07/2021   Procedure: ABDOMINAL AORTOGRAM W/LOWER EXTREMITY;  Surgeon: [name], MD;  Location: [facility] INVASIVE CV LAB;  Service: Cardiovascular;  Laterality: Left;   AORTO-FEMORAL BYPASS GRAFT Bilateral 10/2012   [facility]   CARDIAC  CATHETERIZATION  04/03/2013   [place] (? for abnormal Nuclear ST) --> no PCI, by report, patent grafts.   CARDIAC CATHETERIZATION N/A 09/02/2016   Procedure: Left Heart Cath and Cors/Grafts Angiography;  Surgeon: Kathleene Hazel, MD;  Location: [facility] INVASIVE CV LAB:: Ost LM 60% -> mid LAD 99% subCTO, ost-prox LCx 60%; prox RCA 100% CTO w/ (small) SVG->RPDA prox 50%; SVG->OM1 ok, LIMA->mLAD ok, EF 25% - complicated by VT during access - Shock x 2, Lidocaine & Amiodarone   CARDIAC CATHETERIZATION  11/09/2012   [facility]: due to Sustained VT post-Ob Ao-BiFem Bypass. (no Angina)   CHOLECYSTECTOMY  03/05/2012   Procedure: LAPAROSCOPIC CHOLECYSTECTOMY WITH INTRAOPERATIVE CHOLANGIOGRAM;  Surgeon: Emelia Loron, MD;  Location: [facility] OR;  Service: General;  Laterality: N/A;   CORONARY ARTERY BYPASS GRAFT  11/2012   [place] [facility]: CABG X 3 -> LIMA-mLAD, SVG-highOM1, SVG-RPDA (dRCA).   ENDARTERECTOMY FEMORAL Left 05/13/2021   Procedure: Left Femoral Endarterectomy WITH BOVINE PATCH PROFUNDOPLASTY, REVISION OF LEFT LIMB OF AORTO-BIFEMORAL GRAFT WITH DACRON GRAFT;  Surgeon: Cephus Shelling, MD;  Location: [facility] OR;  Service: Vascular;  Laterality: Left;   EP IMPLANTABLE DEVICE N/A 09/03/2016   Procedure: BI-VENTRICULAR IMPLANTABLE CARDIOVERTER DEFIBRILLATOR  (CRT-D);  Surgeon: Marinus Maw, MD;  Location: [facility] INVASIVE CV LAB;  Service: Cardiovascular;  Laterality: N/A;   INGUINAL HERNIA REPAIR Left 1990s   INSERTION OF ILIAC STENT Left 05/13/2021   Procedure: INSERTION OF SUPERFICIAL FEMORAL ARTERY STENT TIMES THREE AND ANGIOPLASTY;  Surgeon: Cephus Shelling, MD;  Location: [facility] OR;  Service: Vascular;  Laterality: Left;   INTRAOPERATIVE ARTERIOGRAM Left 05/13/2021   Procedure: INTRA OPERATIVE ARTERIOGRAM OF LEFT LOWER EXTREMITY;  Surgeon: Cephus Shelling, MD;  Location: [facility] OR;  Service: Vascular;  Laterality: Left;   NM MYOVIEW LTD  10/05/2022   EF ~28%.  Severely reduced function.  High risk  due to decreased EF.  Large fixed severe defect in the mid to basal inferior and inferolateral wall with associated wall motion abnormality consistent with prior infarction.  No evidence of reversible defect/ischemia. =? Echo EF 40-45% with mid to apical anterior/inferolateral akinesis and inferior akinesis.   STERNOTOMY  09/2013   [facility] [place]): Sternal Wound Infection -> re-do sternotomy with metal place "sheild" placed. -Has chronic staph infection, on chronic suppressive medication   TRANSTHORACIC ECHOCARDIOGRAM  10/05/2022   EF 40 to 45%.  Basal to mid anterolateral and inferolateral akinesis with basal inferior akinesis.  GR 2 DD.  Severely elevated PAP with mild RV dilation.  RV P estimated 67 mmHg).  Severe LA dilation.  Mild to moderate RA dilation.  Mild to moderate TR.  Severe MR likely with restricted posterior leaflet and inferolateral wall motion.  Moderate AOV calcification with no stenosis.  Mild AI.   TRANSTHORACIC ECHOCARDIOGRAM  01/2021   EF 25 to 30%.  Severely depressed EF.  Global HK with worst basal to mid anterolateral and inferolateral as well as anterior basal inferior walls.  GR 1 DD.  Mildly reduced RV function.  Functionally bicuspid aortic valve with moderate calcification but no stenosis.  Overall slightly improved EF.   UMBILICAL HERNIA REPAIR  03/05/2012   Procedure: HERNIA REPAIR UMBILICAL ADULT;  Surgeon: Emelia Loron, MD;  Location: [facility] OR;  Service: General;  Laterality: N/A;    Current Medications: Current Meds  Medication Sig   albuterol (VENTOLIN HFA) 108 (90 Base) MCG/ACT inhaler Inhale 1-2 puffs into the lungs every 6 (six) hours as needed for wheezing or shortness of breath.   Alpha-Lipoic Acid 600 MG TABS Take 600 mg by mouth daily.   apixaban (ELIQUIS) 5 MG TABS tablet Take 1 tablet (5 mg total) by mouth 2 (two) times daily.   aspirin EC 81 MG tablet Take 81 mg by mouth daily.   atorvastatin (LIPITOR) 80 MG tablet Take 40 mg by mouth every evening.    bacitracin ointment Apply 1 Application topically 2 (two) times daily.   carvedilol (COREG) 3.125 MG tablet Take 1 tablet (3.125 mg total) by mouth 2 (two) times daily.   cholecalciferol (VITAMIN D) 25 MCG (1000 UNIT) tablet Take 2,000 Units by mouth every evening.   dicloxacillin (DYNAPEN) 250 MG capsule Take 2 capsules (500 mg total) by mouth 3 (three) times daily.   digoxin (LANOXIN) 0.125 MG tablet Take 1 tablet (0.125 mg total) by mouth daily. Monday through Friday only, None on Saturday and Sunday   fexofenadine (ALLEGRA) 180 MG tablet Take 180 mg by mouth daily.   fluticasone (FLONASE) 50 MCG/ACT nasal spray Place 2 sprays into both nostrils daily as needed for allergies or rhinitis.   furosemide (LASIX) 20 MG tablet Take 20 mg by mouth.   glipiZIDE (GLUCOTROL) 5 MG tablet Take 5 mg by mouth 2 (two) times daily before a meal.   lidocaine (LIDODERM) 5 % Place 1 patch onto the skin daily as needed (For pain).   magnesium oxide (MAG-OX) 400 (241.3 Mg) MG tablet Take 1 tablet (400 mg total) by mouth daily.   metFORMIN (GLUCOPHAGE) 1000 MG tablet Take 1 tablet (1,000 mg total) by mouth 2 (two) times daily with a meal. Resume on 09/06/2016   mexiletine (MEXITIL) 150 MG capsule Take 1 capsule (150 mg total) by mouth every 12 (twelve) hours.   nitroGLYCERIN (NITROSTAT) 0.4 MG SL tablet Place 1 tablet (0.4 mg total) under the tongue every 5 (five) minutes x 3 doses as needed for chest pain.   Nutritional Supplements (ENSURE PLUS PO) Take by mouth.   pantoprazole (PROTONIX) 40 MG tablet Take 1 tablet (40 mg total) by mouth daily.   Tiotropium Bromide Monohydrate (SPIRIVA RESPIMAT) 2.5 MCG/ACT AERS Inhale 2 puffs into the lungs at bedtime.     Allergies:   Lisinopril   Social History   Socioeconomic History   Marital status: Married    Spouse name: Not on file   Number of children: Not on file   Years of education: Not on file   Highest education level: Not on file  Occupational History    Not on file  Tobacco Use   Smoking status: Every Day    Types: Pipe    Passive exposure: Never   Smokeless tobacco: Former    Types: Snuff, Chew   Tobacco comments:    09/03/2016 "used smokeless tobacco in my teens; quit smoking cigarettes in the early 1980s; smokes pipe only"  Vaping Use   Vaping Use: Never used  Substance and Sexual Activity   Alcohol use: No   Drug use: No   Sexual activity: Not on  file  Other Topics Concern   Not on file  Social History Narrative   Routine activity around the house, but is limited more by musculoskeletal leg pain and weakness.      He enjoys smoking a pipe-long ago, he quit cigarettes   Social Determinants of Health   Financial Resource Strain: Not on file  Food Insecurity: No Food Insecurity (02/18/2023)   Hunger Vital Sign    Worried About Running Out of Food in the Last Year: Never true    Ran Out of Food in the Last Year: Never true  Transportation Needs: No Transportation Needs (02/18/2023)   PRAPARE - Administrator, Civil Service (Medical): No    Lack of Transportation (Non-Medical): No  Physical Activity: Not on file  Stress: Not on file  Social Connections: Not on file     Family History: The patient's family history includes Cancer in his brother.  ROS:   Please see the history of present illness.    All other systems reviewed and are negative.  EKGs/Labs/Other Studies Reviewed:    The following studies were reviewed today: Cardiac Studies & Procedures   CARDIAC CATHETERIZATION  CARDIAC CATHETERIZATION 09/02/2016  Narrative  There is severe left ventricular systolic dysfunction.  LV end diastolic pressure is severely elevated.  The left ventricular ejection fraction is less than 25% by visual estimate.  There is no mitral valve regurgitation.  Prox RCA lesion, 100 %stenosed.  SVG graft was visualized by angiography.  Origin to Prox Graft lesion, 50 %stenosed.  Ost Cx to Prox Cx lesion, 60  %stenosed.  Ost LM to LM lesion, 60 %stenosed.  SVG graft was visualized by angiography.  LIMA graft was visualized by angiography.  Mid LAD lesion, 99 %stenosed.  1. Severe triple vessel CAD s/p 3V CABG 2. Moderate distal left main stenosis 3. Subtotal occlusion of the mid LAD. Patent LIMA graft to mid LAD 4. Patent Circumflex system. Patent vein graft to the high obtuse marginal. This fills the entire lateral wall Circumflex system and some of the LAD. Of note, there is also antegrade flow into the Circumflex from injection of the left main. 5. The RCA is occluded proximally. The vein graft to the distal RCA is patent. The vein graft is small in caliber with no flow limiting stenoses. 6. Severe LV systolic dysfunction, LVEF=10-15%. 7. Wide complex tachycardia. This began after access in the left radial artery. As discussed in the narrative, we attempted DCCV x 2, bolus of amiodarone x 2, adenosine. This eventually converted to sinus with IV Lidocaine.  Recommendations: Medical management of CAD. EP to see to discuss wide complex tachycardia.  Findings Coronary Findings Diagnostic  Dominance: Right  Left Main  Left Anterior Descending  First Diagonal Branch Vessel is large in size.  Second Diagonal Branch Vessel is small in size.  Third Diagonal Branch Vessel is small in size.  Left Circumflex  First Obtuse Marginal Branch Vessel is large in size.  Second Obtuse Marginal Branch Vessel is moderate in size.  Third Obtuse Marginal Branch Vessel is moderate in size.  Right Coronary Artery The lesion is chronically occluded.  Saphenous Graft To RPDA SVG graft was visualized by angiography. The entire graft is small. There is a proximal stenosis followed by dilation of the vein graft (likely at a valve). The remainder of the graft is almost the same size as the proximal body.  Saphenous Graft To 1st Mrg SVG graft was visualized by angiography.  LIMA LIMA  Graft To  Mid LAD LIMA graft was visualized by angiography.  Intervention  No interventions have been documented.   STRESS TESTS  MYOCARDIAL PERFUSION IMAGING 10/05/2022  Narrative   Findings are consistent with infarction. The study is high risk due to severely depressed ejection fraction.   No ST deviation was noted.   LV perfusion is abnormal. Defect 1: There is a large defect with severe reduction in uptake present in the mid to basal inferior and inferolateral location(s) that is fixed. There is abnormal wall motion in the defect area. Consistent with infarction.   Left ventricular function is abnormal. Global function is severely reduced. Nuclear stress EF: 28 %. The left ventricular ejection fraction is severely decreased (<30%). End diastolic cavity size is severely enlarged. End systolic cavity size is severely enlarged.   Prior study not available for comparison.   ECHOCARDIOGRAM  ECHOCARDIOGRAM COMPLETE 10/05/2022  Narrative ECHOCARDIOGRAM REPORT    Patient Name:   [name] [name] Date of Exam: 10/05/2022 Medical Rec #:  [id number]       Height:       66.0 in Accession #:    [id number]      Weight:       135.0 lb Date of Birth:  02-29-1946        BSA:          1.692 m Patient Age:    76 years        BP:           128/64 mmHg Patient Gender: M               HR:           60 bpm. Exam Location:  [address]  Procedure: 2D Echo, Cardiac Doppler and Color Doppler  Indications:    R01.1 Murmur I42.9 Cardiomyopathy  History:        Patient has prior history of Echocardiogram examinations, most recent 02/05/2021. Cardiomyopathy, Previous Myocardial Infarction and CAD, Defibrillator; Risk Factors:Diabetes and Dyslipidemia. Chronic systolic heart failure. COPD.  Sonographer:    Daphine Deutscher RDCS Referring Phys: 68 DAVID W HARDING  IMPRESSIONS   1. Left ventricular ejection fraction, by estimation, is 40 to 45%. The left ventricle has mildly decreased function. The  left ventricle demonstrates regional wall motion abnormalities with basal to mid anterolateral and inferolateral akinesis and basal inferior akinesis. The left ventricular internal cavity size was mildly dilated. There is mild concentric left ventricular hypertrophy. Left ventricular diastolic parameters are consistent with Grade II diastolic dysfunction (pseudonormalization). 2. Right ventricular systolic function is moderately reduced. The right ventricular size is mildly enlarged. There is severely elevated pulmonary artery systolic pressure. The estimated right ventricular systolic pressure is 66.7 mmHg. 3. Left atrial size was severely dilated. 4. Right atrial size was mild to moderately dilated. 5. The mitral valve is abnormal. Severe mitral valve regurgitation. 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 No evidence of mitral stenosis. Moderate mitral annular calcification. 6. Tricuspid valve regurgitation is mild to moderate. 7. The aortic valve is tricuspid. There is moderate calcification of the aortic valve. Aortic valve regurgitation is mild. No aortic stenosis is present. 8. The inferior vena cava is normal in size with <50% respiratory variability, suggesting right atrial pressure of 8 mmHg.  FINDINGS Left Ventricle: Left ventricular ejection fraction, by estimation, is 40 to 45%. The left ventricle has mildly decreased function. The left ventricle demonstrates regional wall motion abnormalities. The left  ventricular internal cavity size was mildly dilated. There is mild concentric left ventricular hypertrophy. Left ventricular diastolic parameters are consistent with Grade II diastolic dysfunction (pseudonormalization).  Right Ventricle: The right ventricular size is mildly enlarged. No increase in right ventricular wall thickness. Right ventricular systolic function is moderately reduced. There is  severely elevated pulmonary artery systolic pressure. The tricuspid regurgitant velocity is 3.83 m/s, and with an assumed right atrial pressure of 8 mmHg, the estimated right ventricular systolic pressure is 66.7 mmHg.  Left Atrium: Left atrial size was severely dilated.  Right Atrium: Right atrial size was mild to moderately dilated.  Pericardium: There is no evidence of pericardial effusion.  Mitral Valve: The mitral valve is abnormal. There is moderate calcification of the mitral valve leaflet(s). Moderate mitral annular calcification. Severe mitral valve regurgitation. No evidence of mitral valve stenosis.  Tricuspid Valve: The tricuspid valve is normal in structure. Tricuspid valve regurgitation is mild to moderate.  Aortic Valve: The aortic valve is tricuspid. There is moderate calcification of the aortic valve. Aortic valve regurgitation is mild. Aortic regurgitation PHT measures 554 msec. No aortic stenosis is present. Aortic valve mean gradient measures 7.4 mmHg. Aortic valve peak gradient measures 13.8 mmHg. Aortic valve area, by VTI measures 0.91 cm.  Pulmonic Valve: The pulmonic valve was normal in structure. Pulmonic valve regurgitation is not visualized.  Aorta: The aortic root is normal in size and structure.  Venous: The inferior vena cava is normal in size with less than 50% respiratory variability, suggesting right atrial pressure of 8 mmHg.  IAS/Shunts: No atrial level shunt detected by color flow Doppler.  Additional Comments: A device lead is visualized in the right ventricle.   LEFT VENTRICLE PLAX 2D LVIDd:         6.30 cm   Diastology LVIDs:         4.80 cm   LV e' medial:    4.51 cm/s LV PW:         0.90 cm   LV E/e' medial:  23.9 LV IVS:        1.00 cm   LV e' lateral:   4.89 cm/s LVOT diam:     2.00 cm   LV E/e' lateral: 22.1 LV SV:         33 LV SV Index:   19 LVOT Area:     3.14 cm   RIGHT VENTRICLE            IVC RV Basal diam:  4.50 cm    IVC  diam: 1.70 cm RV S prime:     8.56 cm/s TAPSE (M-mode): 2.0 cm  LEFT ATRIUM              Index        RIGHT ATRIUM           Index LA diam:        5.50 cm  3.25 cm/m   RA Area:     20.50 cm LA Vol (A2C):   135.0 ml 79.78 ml/m  RA Volume:   64.40 ml  38.06 ml/m LA Vol (A4C):   114.0 ml 67.37 ml/m LA Biplane Vol: 125.0 ml 73.87 ml/m AORTIC VALVE AV Area (Vmax):    0.90 cm AV Area (Vmean):   0.86 cm AV Area (VTI):     0.91 cm AV Vmax:           185.80 cm/s AV Vmean:          122.200 cm/s  AV VTI:            0.357 m AV Peak Grad:      13.8 mmHg AV Mean Grad:      7.4 mmHg LVOT Vmax:         52.95 cm/s LVOT Vmean:        33.550 cm/s LVOT VTI:          0.104 m LVOT/AV VTI ratio: 0.29 AI PHT:            554 msec  AORTA Ao Root diam: 3.20 cm  MITRAL VALVE                TRICUSPID VALVE MV Area (PHT): 3.27 cm     TR Peak grad:   58.7 mmHg MV Decel Time: 232 msec     TR Vmax:        383.00 cm/s MR Peak grad: 102.6 mmHg MR Mean grad: 67.5 mmHg     SHUNTS MR Vmax:      506.50 cm/s   Systemic VTI:  0.10 m MR Vmean:     390.0 cm/s    Systemic Diam: 2.00 cm MV E velocity: 108.00 cm/s MV A velocity: 55.00 cm/s MV E/A ratio:  1.96  [name] McleanMD Electronically signed by Wilfred Lacy Signature Date/Time: 10/05/2022/5:41:30 PM    Final                  Recent Labs: 04/30/2022: Magnesium 2.1 03/08/2023: BUN 9; Creatinine, Ser 0.75; Potassium 4.7; Sodium 130 03/10/2023: Hemoglobin 13.0; Platelets 298  Recent Lipid Panel    Component Value Date/Time   CHOL 115 10/07/2021 1408   TRIG 91 10/07/2021 1408   HDL 35 (L) 10/07/2021 1408   CHOLHDL 3.3 10/07/2021 1408   CHOLHDL 5.0 05/14/2021 0335   VLDL 22 05/14/2021 0335   LDLCALC 62 10/07/2021 1408   Risk Assessment/Calculations:    CHA2DS2-VASc Score =     This indicates a  % annual risk of stroke. The patient's score is based upon:      STS Surgical Risk calculation:  Procedure Type: Isolated  MVR PERIOPERATIVE OUTCOME ESTIMATE % Operative Mortality 6.85% Morbidity & Mortality 18.7% Stroke 1.73% Renal Failure 2.73% Reoperation 4.44% Prolonged Ventilation 12.2% Deep Sternal Wound Infection 0.047% Long Hospital Stay (>14 days) 13% Short Hospital Stay (<6 days)* 17%  Procedure Type: Isolated MVr PERIOPERATIVE OUTCOME ESTIMATE % Operative Mortality 2.58% Morbidity & Mortality 12% Stroke 1.17% Renal Failure 1.26% Reoperation 3.03% Prolonged Ventilation 7.11% Deep Sternal Wound Infection 0.001% Long Hospital Stay (>14 days) 11.3% Short Hospital Stay (<6 days)* 24.4%  Physical Exam:    VS:  BP 124/72   Pulse 68   Ht 5\' 6"  (1.676 m)   Wt 126 lb (57.2 kg) Comment: PT REPORTED  SpO2 98%   BMI 20.34 kg/m     Wt Readings from Last 3 Encounters:  03/10/23 126 lb (57.2 kg)  03/03/23 123 lb (55.8 kg)  03/01/23 129 lb (58.5 kg)     GEN: Elderly male, in a motorized scooter, in no acute distress HEENT: Normal NECK: No JVD; No carotid bruits LYMPHATICS: No lymphadenopathy CARDIAC: RRR, 3/6 holosystolic murmur at the apex RESPIRATORY:  Clear to auscultation without rales, wheezing or rhonchi  ABDOMEN: Soft, non-tender, non-distended MUSCULOSKELETAL: 1+ bilateral pretibial edema; No deformity  SKIN: Warm and dry NEUROLOGIC:  Alert and oriented x 3 PSYCHIATRIC:  Normal affect   ASSESSMENT:    1. Chronic HFrEF (heart failure with reduced ejection fraction) (HCC)  2. Nonrheumatic mitral valve regurgitation    PLAN:    In order of problems listed above:  The patient has severe ischemic mitral regurgitation.  I reviewed his most recent transthoracic echo which is notable for an LVEF of 40 to 45% with regional wall motion abnormalities involving the inferolateral walls.  There is severe mitral regurgitation with apparent restriction of the posterior mitral valve leaflet.  There is also severe pulmonary hypertension and mild RV dysfunction noted.  The patient has [place] Heart Association functional class III symptoms of chronic systolic heart failure complicated by mitral regurgitation.  We discussed the natural history of ischemic mitral regurgitation today.  We discussed potential treatment options including continued medical management versus transcatheter edge-to-edge repair of the mitral valve.  He understands that further evaluation is necessary if he would like to pursue transcatheter edge-to-edge repair.  He will need to undergo a transesophageal echo as well as a right and left heart catheterization.  The patient would like to pursue this.  We will schedule these procedures to be done on the same day if possible. I have reviewed the risks, indications, and alternatives to cardiac catheterization, possible angioplasty, and stenting with the patient. Risks include but are not limited to bleeding, infection, vascular injury, stroke, myocardial infection, arrhythmia, kidney injury, radiation-related injury in the case of prolonged fluoroscopy use, emergency cardiac surgery, and death. The patient understands the risks of serious complication is 1-2 in 1000 with diagnostic cardiac cath and 1-2% or less with angioplasty/stenting.  The patient will also be referred for formal heart failure consultation as part of a multidisciplinary approach to his care.  If he has suitable anatomy for transcatheter edge-to-edge repair of the mitral valve and his medical therapy can be optimized, it would be appropriate to consider him for the procedure.  Apixaban will need to be held for his heart catheterization per protocol.  His left radial pulse is palpable and hopefully his catheterization can be done via left radial access.      Informed Consent   Shared Decision Making/Informed Consent The risks [esophageal damage, perforation (1:10,000 risk), bleeding, pharyngeal hematoma as well as other potential complications associated with conscious sedation including aspiration,  arrhythmia, respiratory failure and death], benefits (treatment guidance and diagnostic support) and alternatives of a transesophageal echocardiogram were discussed in detail with Mr. Mclear and he is willing to proceed.        Medication Adjustments/Labs and Tests Ordered: Current medicines are reviewed at length with the patient today.  Concerns regarding medicines are outlined above.  Orders Placed This Encounter  Procedures   CBC   AMB referral to CHF clinic   No orders of the defined types were placed in this encounter.   Patient Instructions  Medication Instructions:  Your physician has recommended you make the following change in your medication:   Continue to hold Valsartan   *If you need a refill on your cardiac medications before your next appointment, please call your pharmacy*   Lab Work: CBC If you have labs (blood work) drawn today and your tests are completely normal, you will receive your results only by: MyChart Message (if you have MyChart) OR A paper copy in the mail If you have any lab test that is abnormal or we need to change your treatment, we will call you to review the results.   Testing/Procedures: Your physician has requested that you have a TEE. During a TEE, sound waves are used to create images of your  heart. It provides your doctor with information about the size and shape of your heart and how well your heart's chambers and valves are working. In this test, a transducer is attached to the end of a flexible tube that's guided down your throat and into your esophagus (the tube leading from you mouth to your stomach) to get a more detailed image of your heart. You are not awake for the procedure. Please see the instruction sheet given to you today. For further information please visit [email or web address]    Your physician has requested that you have a cardiac catheterization. Cardiac catheterization is used to diagnose and/or treat various heart  conditions. Doctors may recommend this procedure for a number of different reasons. The most common reason is to evaluate chest pain. Chest pain can be a symptom of coronary artery disease (CAD), and cardiac catheterization can show whether plaque is narrowing or blocking your heart's arteries. This procedure is also used to evaluate the valves, as well as measure the blood flow and oxygen levels in different parts of your heart. For further information please visit [email or web address] Please follow instruction sheet, as given.    Follow-Up: At [facility], you and your health needs are our priority.  As part of our continuing mission to provide you with exceptional heart care, we have created designated Provider Care Teams.  These Care Teams include your primary Cardiologist (physician) and Advanced Practice Providers (APPs -  Physician Assistants and Nurse Practitioners) who all work together to provide you with the care you need, when you need it.    Your next appointment:   As directed following procedure on July 5  Provider:   Dr Excell Seltzer  Other Instructions    Dear [name] [name]  You are scheduled for a TEE (Transesophageal Echocardiogram) on Friday, July 5 with Dr. Donell Sievert.  Please arrive at the [facility] (Main Entrance A) at [facility]: [address] [place], [place] [address] at 6:30 AM (This time is 1.5 hour(s) before your procedure to ensure your preparation). Free valet parking service is available. You will check in at ADMITTING. The support person will be asked to wait in the waiting room.  It is OK to have someone drop you off and come back when you are ready to be discharged.     DIET:  Nothing to eat or drink after midnight except a sip of water with medications (see medication instructions below)  MEDICATION INSTRUCTIONS: !!IF ANY NEW MEDICATIONS ARE STARTED AFTER TODAY, PLEASE NOTIFY YOUR PROVIDER AS SOON AS POSSIBLE!!  FYI: Medications such as  Semaglutide (Ozempic, [place]), Tirzepatide (Mounjaro, Zepbound), Dulaglutide (Trulicity), etc ("GLP1 agonists") AND Canagliflozin (Invokana), Dapagliflozin (Farxiga), Empagliflozin (Jardiance), Ertugliflozin (Steglatro), Bexagliflozin [employer]) or any combination with one of these drugs such as Invokamet (Canagliflozin/Metformin), Synjardy (Empagliflozin/Metformin), etc ("SGLT2 inhibitors") must be held around the time of a procedure. This is not a comprehensive list of all of these drugs. Please review all of your medications and talk to your provider if you take any one of these. If you are not sure, ask your provider.        :1}Continue taking your anticoagulant (blood thinner): Apixaban (Eliquis).  You will need to continue this after your procedure until you are told by your provider that it is safe to stop.    LABS: today  FYI:  For your safety, and to allow [place] to monitor your vital signs accurately during the surgery/procedure we request: If you have artificial nails, gel  coating, SNS etc, please have those removed prior to your surgery/procedure. Not having the nail coverings /polish removed may result in cancellation or delay of your surgery/procedure.  You must have a responsible person to drive you home and stay in the waiting area during your procedure. Failure to do so could result in cancellation.  Bring your insurance cards.  *Special Note: Every effort is made to have your procedure done on time. Occasionally there are emergencies that occur at the hospital that may cause delays. Please be patient if a delay does occur.     [facility] A DEPT OF [place]. [facility] AT [facility] [address] [place], [place] [address] [id number] [facility] [place] [address] Dept: [phone] Loc: [phone]  [name] [name]  03/10/2023  You are scheduled for a Cardiac Catheterization on Friday, July 5 with Dr. Bryan Lemma.  1. Please arrive at the  [facility] (Main Entrance A) at [facility]: [address] [place], [place] [address] at 6:30 AM   Special note: Every effort is made to have your procedure done on time. Please understand that emergencies sometimes delay scheduled procedures.  2. Diet: Do not eat solid foods after midnight.  The patient may have clear liquids until 5am upon the day of the procedure.  3. Labs: TODAY  4. Medication instructions in preparation for your procedure:   Contrast Allergy: No   Stop taking Eliquis (Apixiban) on Wednesday, July 3.  Stop taking, Lasix (Furosemide)  Friday, July 5,    Do not take Diabetes Med Glucophage (Metformin) on the day of the procedure and HOLD 48 HOURS AFTER THE PROCEDURE.  Hold Glipizide the day of procedure   On the morning of your procedure, take your Aspirin 81 mg and any morning medicines NOT listed above.  You may use sips of water.  5. Plan to go home the same day, you will only stay overnight if medically necessary. 6. Bring a current list of your medications and current insurance cards. 7. You MUST have a responsible person to drive you home. 8. Someone MUST be with you the first 24 hours after you arrive home or your discharge will be delayed. 9. Please wear clothes that are easy to get on and off and wear slip-on shoes.  Thank you for allowing [place] to care for you!   -- [facility] Invasive Cardiovascular services       Signed, Tonny Bollman, MD  03/10/2023 4:47 PM    [facility]

## 2023-03-10 NOTE — Patient Instructions (Addendum)
Medication Instructions:  Your physician has recommended you make the following change in your medication:   Continue to hold Valsartan   *If you need a refill on your cardiac medications before your next appointment, please call your pharmacy*   Lab Work: CBC If you have labs (blood work) drawn today and your tests are completely normal, you will receive your results only by: MyChart Message (if you have MyChart) OR A paper copy in the mail If you have any lab test that is abnormal or we need to change your treatment, we will call you to review the results.   Testing/Procedures: Your physician has requested that you have a TEE. During a TEE, sound waves are used to create images of your heart. It provides your doctor with information about the size and shape of your heart and how well your heart's chambers and valves are working. In this test, a transducer is attached to the end of a flexible tube that's guided down your throat and into your esophagus (the tube leading from you mouth to your stomach) to get a more detailed image of your heart. You are not awake for the procedure. Please see the instruction sheet given to you today. For further information please visit [email or web address]    Your physician has requested that you have a cardiac catheterization. Cardiac catheterization is used to diagnose and/or treat various heart conditions. Doctors may recommend this procedure for a number of different reasons. The most common reason is to evaluate chest pain. Chest pain can be a symptom of coronary artery disease (CAD), and cardiac catheterization can show whether plaque is narrowing or blocking your heart's arteries. This procedure is also used to evaluate the valves, as well as measure the blood flow and oxygen levels in different parts of your heart. For further information please visit [email or web address] Please follow instruction sheet, as given.    Follow-Up: At [facility], you and your health needs are our priority.  As part of our continuing mission to provide you with exceptional heart care, we have created designated Provider Care Teams.  These Care Teams include your primary Cardiologist (physician) and Advanced Practice Providers (APPs -  Physician Assistants and Nurse Practitioners) who all work together to provide you with the care you need, when you need it.    Your next appointment:   As directed following procedure on July 5  Provider:   Dr Excell Seltzer  Other Instructions    Dear [name] [name]  You are scheduled for a TEE (Transesophageal Echocardiogram) on Friday, July 5 with Dr. Donell Sievert.  Please arrive at the [facility] (Main Entrance A) at [facility]: [address] [place], [place] [address] at 6:30 AM (This time is 1.5 hour(s) before your procedure to ensure your preparation). Free valet parking service is available. You will check in at ADMITTING. The support person will be asked to wait in the waiting room.  It is OK to have someone drop you off and come back when you are ready to be discharged.     DIET:  Nothing to eat or drink after midnight except a sip of water with medications (see medication instructions below)  MEDICATION INSTRUCTIONS: !!IF ANY NEW MEDICATIONS ARE STARTED AFTER TODAY, PLEASE NOTIFY YOUR PROVIDER AS SOON AS POSSIBLE!!  FYI: Medications such as Semaglutide [employer], [place]), Tirzepatide (Mounjaro, Zepbound), Dulaglutide (Trulicity), etc ("GLP1 agonists") AND Canagliflozin (Invokana), Dapagliflozin (Farxiga), Empagliflozin (Jardiance), Ertugliflozin (Steglatro), Bexagliflozin [employer]) or any combination with one of these  drugs such as Invokamet (Canagliflozin/Metformin), Synjardy (Empagliflozin/Metformin), etc ("SGLT2 inhibitors") must be held around the time of a procedure. This is not a comprehensive list of all of these drugs. Please review all of your medications and talk to your provider if you  take any one of these. If you are not sure, ask your provider.        :1}Continue taking your anticoagulant (blood thinner): Apixaban (Eliquis).  You will need to continue this after your procedure until you are told by your provider that it is safe to stop.    LABS: today  FYI:  For your safety, and to allow [place] to monitor your vital signs accurately during the surgery/procedure we request: If you have artificial nails, gel coating, SNS etc, please have those removed prior to your surgery/procedure. Not having the nail coverings /polish removed may result in cancellation or delay of your surgery/procedure.  You must have a responsible person to drive you home and stay in the waiting area during your procedure. Failure to do so could result in cancellation.  Bring your insurance cards.  *Special Note: Every effort is made to have your procedure done on time. Occasionally there are emergencies that occur at the hospital that may cause delays. Please be patient if a delay does occur.     [facility] - [name] A DEPT OF [place]. [facility] AT [facility] [address] [place], [place] [id number] [facility] [place] [address] Dept: [phone] Loc: [phone]  [name] [name]  03/10/2023  You are scheduled for a Cardiac Catheterization on Friday, July 5 with Dr. Bryan Lemma.  1. Please arrive at the [facility] (Main Entrance A) at [facility]: [address] [place], [place] [address] at 6:30 AM   Special note: Every effort is made to have your procedure done on time. Please understand that emergencies sometimes delay scheduled procedures.  2. Diet: Do not eat solid foods after midnight.  The patient may have clear liquids until 5am upon the day of the procedure.  3. Labs: TODAY  4. Medication instructions in preparation for your procedure:   Contrast Allergy: No   Stop taking Eliquis (Apixiban) on Wednesday, July 3.  Stop taking,  Lasix (Furosemide)  Friday, July 5,    Do not take Diabetes Med Glucophage (Metformin) on the day of the procedure and HOLD 48 HOURS AFTER THE PROCEDURE.  Hold Glipizide the day of procedure   On the morning of your procedure, take your Aspirin 81 mg and any morning medicines NOT listed above.  You may use sips of water.  5. Plan to go home the same day, you will only stay overnight if medically necessary. 6. Bring a current list of your medications and current insurance cards. 7. You MUST have a responsible person to drive you home. 8. Someone MUST be with you the first 24 hours after you arrive home or your discharge will be delayed. 9. Please wear clothes that are easy to get on and off and wear slip-on shoes.  Thank you for allowing [place] to care for you!   -- [place] In[facility] Cardiovascular services

## 2023-03-14 ENCOUNTER — Other Ambulatory Visit: Payer: Self-pay

## 2023-03-14 DIAGNOSIS — I739 Peripheral vascular disease, unspecified: Secondary | ICD-10-CM

## 2023-03-22 ENCOUNTER — Telehealth: Payer: Self-pay | Admitting: *Deleted

## 2023-03-22 NOTE — Telephone Encounter (Addendum)
Cardiac Catheterization scheduled at [facility] for: Friday March 25, 2023 9 AM/TEE 8 AM Arrival time [facility] Main Entrance A at: 7 AM  Nothing to eat or drink after midnight prior to procedures, may have sips of water to take medications.  Medication instructions: -Hold:  Eliquis-none 03/23/23 until post procedure  Metformin-day of procedure and 48 hour post procedure   Glipizide-AM of procedure  Lasix-AM of procedure -Other usual morning medications can be taken with sips of water including aspirin 81 mg.  Confirmed patient has responsible adult to drive home post procedure and be with patient first 24 hours after arriving home.  Plan to go home the same day, you will only stay overnight if medically necessary.  Reviewed procedure instructions with patient's wife (DPR), [name], and daughter (DPR), [name].

## 2023-03-23 NOTE — Pre-Procedure Instructions (Addendum)
Spoke to wife, [name], regarding instructions for TEE on Friday:  Instructed to arrive at 0645, NPO after midnight on Thursday  Confirmed patient has a ride home and responsible person to stay with patient for 24 hours after the procedure

## 2023-03-24 ENCOUNTER — Encounter (HOSPITAL_COMMUNITY): Payer: Self-pay | Admitting: Internal Medicine

## 2023-03-24 NOTE — Anesthesia Preprocedure Evaluation (Addendum)
Anesthesia Evaluation  Patient identified by MRN, date of birth, ID band Patient awake    Reviewed: Allergy & Precautions, NPO status , Patient's Chart, lab work & pertinent test results, reviewed documented beta blocker date and time   History of Anesthesia Complications Negative for: history of anesthetic complications  Airway Mallampati: III  TM Distance: >3 FB Neck ROM: Full    Dental  (+) Dental Advisory Given, Edentulous Upper   Pulmonary COPD, Current Smoker and Patient abstained from smoking.   Pulmonary exam normal        Cardiovascular hypertension, Pt. on medications and Pt. on home beta blockers pulmonary hypertension+ CAD, + Past MI, + Peripheral Vascular Disease and +CHF  + dysrhythmias Ventricular Tachycardia + Cardiac Defibrillator + Valvular Problems/Murmurs MR  Rhythm:Regular Rate:Normal + Systolic murmurs  '24 TTE - EF 40 to 45%. Basal to mid anterolateral and inferolateral akinesis and basal inferior akinesis. Mild concentric left ventricular hypertrophy. Left ventricular diastolic parameters are consistent with Grade II diastolic dysfunction (pseudonormalization). Right ventricular systolic function is moderately reduced. The right ventricular size is mildly enlarged. There is severely elevated pulmonary artery systolic pressure. The estimated right ventricular systolic pressure is 66.7 mmHg. Left atrial size was severely dilated. Right atrial size was mild to moderately dilated. Severe mitral valve regurgitation. 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 Tricuspid valve regurgitation is mild to moderate. Aortic valve regurgitation is mild.     Neuro/Psych  PSYCHIATRIC DISORDERS Anxiety     negative neurological ROS     GI/Hepatic Neg liver ROS,GERD  Medicated and Controlled,,  Endo/Other  diabetes, Type 2, Oral Hypoglycemic  Agents    Renal/GU negative Renal ROS     Musculoskeletal negative musculoskeletal ROS (+)    Abdominal   Peds  Hematology  On eliquis    Anesthesia Other Findings   Reproductive/Obstetrics                             Anesthesia Physical Anesthesia Plan  ASA: 4  Anesthesia Plan: MAC   Post-op Pain Management: Minimal or no pain anticipated   Induction:   PONV Risk Score and Plan: 0 and Propofol infusion and Treatment may vary due to age or medical condition  Airway Management Planned: Nasal Cannula and Natural Airway  Additional Equipment: None  Intra-op Plan:   Post-operative Plan:   Informed Consent: I have reviewed the patients History and Physical, chart, labs and discussed the procedure including the risks, benefits and alternatives for the proposed anesthesia with the patient or authorized representative who has indicated his/her understanding and acceptance.       Plan Discussed with: CRNA and Anesthesiologist  Anesthesia Plan Comments:        Anesthesia Quick Evaluation

## 2023-03-25 ENCOUNTER — Telehealth: Payer: Self-pay

## 2023-03-25 ENCOUNTER — Ambulatory Visit (HOSPITAL_BASED_OUTPATIENT_CLINIC_OR_DEPARTMENT_OTHER): Payer: Non-veteran care | Admitting: Anesthesiology

## 2023-03-25 ENCOUNTER — Encounter (HOSPITAL_COMMUNITY): Payer: Self-pay | Admitting: Internal Medicine

## 2023-03-25 ENCOUNTER — Ambulatory Visit (HOSPITAL_COMMUNITY)
Admission: RE | Disposition: A | Discharge status: Home / Self Care | Payer: Self-pay | Source: Home / Self Care | Attending: Internal Medicine

## 2023-03-25 ENCOUNTER — Ambulatory Visit (HOSPITAL_COMMUNITY): Payer: Non-veteran care | Admitting: Anesthesiology

## 2023-03-25 ENCOUNTER — Ambulatory Visit (HOSPITAL_COMMUNITY)
Admission: RE | Admit: 2023-03-25 | Discharge: 2023-03-25 | Disposition: A | Discharge status: Home / Self Care | Payer: No Typology Code available for payment source | Attending: Internal Medicine | Admitting: Internal Medicine

## 2023-03-25 ENCOUNTER — Encounter (HOSPITAL_COMMUNITY)
Admission: RE | Disposition: A | Discharge status: Home / Self Care | Payer: Self-pay | Source: Home / Self Care | Attending: Internal Medicine

## 2023-03-25 ENCOUNTER — Other Ambulatory Visit: Payer: Self-pay

## 2023-03-25 ENCOUNTER — Ambulatory Visit (HOSPITAL_BASED_OUTPATIENT_CLINIC_OR_DEPARTMENT_OTHER)
Admission: RE | Admit: 2023-03-25 | Discharge: 2023-03-25 | Disposition: A | Discharge status: Home / Self Care | Payer: No Typology Code available for payment source | Source: Ambulatory Visit | Attending: Internal Medicine | Admitting: Internal Medicine

## 2023-03-25 DIAGNOSIS — I5022 Chronic systolic (congestive) heart failure: Secondary | ICD-10-CM

## 2023-03-25 DIAGNOSIS — Z95810 Presence of automatic (implantable) cardiac defibrillator: Secondary | ICD-10-CM | POA: Diagnosis not present

## 2023-03-25 DIAGNOSIS — I2510 Atherosclerotic heart disease of native coronary artery without angina pectoris: Secondary | ICD-10-CM | POA: Insufficient documentation

## 2023-03-25 DIAGNOSIS — F17290 Nicotine dependence, other tobacco product, uncomplicated: Secondary | ICD-10-CM | POA: Insufficient documentation

## 2023-03-25 DIAGNOSIS — I5042 Chronic combined systolic (congestive) and diastolic (congestive) heart failure: Secondary | ICD-10-CM | POA: Insufficient documentation

## 2023-03-25 DIAGNOSIS — E1169 Type 2 diabetes mellitus with other specified complication: Secondary | ICD-10-CM

## 2023-03-25 DIAGNOSIS — I272 Pulmonary hypertension, unspecified: Secondary | ICD-10-CM | POA: Insufficient documentation

## 2023-03-25 DIAGNOSIS — I340 Nonrheumatic mitral (valve) insufficiency: Secondary | ICD-10-CM

## 2023-03-25 DIAGNOSIS — I110 Hypertensive heart disease with heart failure: Secondary | ICD-10-CM | POA: Diagnosis not present

## 2023-03-25 DIAGNOSIS — Z951 Presence of aortocoronary bypass graft: Secondary | ICD-10-CM | POA: Insufficient documentation

## 2023-03-25 DIAGNOSIS — I2582 Chronic total occlusion of coronary artery: Secondary | ICD-10-CM | POA: Insufficient documentation

## 2023-03-25 DIAGNOSIS — I083 Combined rheumatic disorders of mitral, aortic and tricuspid valves: Secondary | ICD-10-CM | POA: Insufficient documentation

## 2023-03-25 DIAGNOSIS — Z7901 Long term (current) use of anticoagulants: Secondary | ICD-10-CM | POA: Insufficient documentation

## 2023-03-25 DIAGNOSIS — I255 Ischemic cardiomyopathy: Secondary | ICD-10-CM | POA: Diagnosis not present

## 2023-03-25 DIAGNOSIS — J431 Panlobular emphysema: Secondary | ICD-10-CM | POA: Insufficient documentation

## 2023-03-25 HISTORY — PX: TEE WITHOUT CARDIOVERSION: SHX5443

## 2023-03-25 HISTORY — PX: RIGHT/LEFT HEART CATH AND CORONARY/GRAFT ANGIOGRAPHY: CATH118267

## 2023-03-25 LAB — POCT I-STAT 7, (LYTES, BLD GAS, ICA,H+H)
Acid-Base Excess: 0 mmol/L (ref 0.0–2.0)
Bicarbonate: 24.3 mmol/L (ref 20.0–28.0)
Calcium, Ion: 1.14 mmol/L — ABNORMAL LOW (ref 1.15–1.40)
HCT: 38 % — ABNORMAL LOW (ref 39.0–52.0)
Hemoglobin: 12.9 g/dL — ABNORMAL LOW (ref 13.0–17.0)
O2 Saturation: 94 %
Potassium: 4.1 mmol/L (ref 3.5–5.1)
Sodium: 134 mmol/L — ABNORMAL LOW (ref 135–145)
TCO2: 25 mmol/L (ref 22–32)
pCO2 arterial: 39.2 mmHg (ref 32–48)
pH, Arterial: 7.4 (ref 7.35–7.45)
pO2, Arterial: 69 mmHg — ABNORMAL LOW (ref 83–108)

## 2023-03-25 LAB — POCT I-STAT EG7
Acid-Base Excess: 0 mmol/L (ref 0.0–2.0)
Acid-Base Excess: 1 mmol/L (ref 0.0–2.0)
Bicarbonate: 25.7 mmol/L (ref 20.0–28.0)
Bicarbonate: 26 mmol/L (ref 20.0–28.0)
Calcium, Ion: 1.09 mmol/L — ABNORMAL LOW (ref 1.15–1.40)
Calcium, Ion: 1.17 mmol/L (ref 1.15–1.40)
HCT: 38 % — ABNORMAL LOW (ref 39.0–52.0)
HCT: 39 % (ref 39.0–52.0)
Hemoglobin: 12.9 g/dL — ABNORMAL LOW (ref 13.0–17.0)
Hemoglobin: 13.3 g/dL (ref 13.0–17.0)
O2 Saturation: 42 %
O2 Saturation: 45 %
Potassium: 4 mmol/L (ref 3.5–5.1)
Potassium: 4.2 mmol/L (ref 3.5–5.1)
Sodium: 135 mmol/L (ref 135–145)
Sodium: 137 mmol/L (ref 135–145)
TCO2: 27 mmol/L (ref 22–32)
TCO2: 27 mmol/L (ref 22–32)
pCO2, Ven: 43.1 mmHg — ABNORMAL LOW (ref 44–60)
pCO2, Ven: 43.2 mmHg — ABNORMAL LOW (ref 44–60)
pH, Ven: 7.385 (ref 7.25–7.43)
pH, Ven: 7.388 (ref 7.25–7.43)
pO2, Ven: 24 mmHg — CL (ref 32–45)
pO2, Ven: 25 mmHg — CL (ref 32–45)

## 2023-03-25 LAB — ECHO TEE
MV M vel: 4.56 m/s
MV Peak grad: 83.2 mmHg
Radius: 0.7 cm

## 2023-03-25 LAB — GLUCOSE, CAPILLARY: Glucose-Capillary: 195 mg/dL — ABNORMAL HIGH (ref 70–99)

## 2023-03-25 SURGERY — RIGHT/LEFT HEART CATH AND CORONARY/GRAFT ANGIOGRAPHY
Anesthesia: LOCAL

## 2023-03-25 SURGERY — ECHOCARDIOGRAM, TRANSESOPHAGEAL
Anesthesia: Monitor Anesthesia Care

## 2023-03-25 MED ORDER — ONDANSETRON HCL 4 MG/2ML IJ SOLN: 4.0000 mg | Freq: Four times a day (QID) | INTRAMUSCULAR | Status: DC | PRN

## 2023-03-25 MED ORDER — SODIUM CHLORIDE 0.9 % IV SOLN: INTRAVENOUS | Status: DC

## 2023-03-25 MED ORDER — FUROSEMIDE 10 MG/ML IJ SOLN
INTRAMUSCULAR | Status: AC
  Filled 2023-03-25: qty 4

## 2023-03-25 MED ORDER — LIDOCAINE HCL (PF) 1 % IJ SOLN
INTRAMUSCULAR | Status: AC
  Filled 2023-03-25: qty 30

## 2023-03-25 MED ORDER — ASPIRIN 81 MG PO CHEW: 81.0000 mg | CHEWABLE_TABLET | ORAL | Status: DC

## 2023-03-25 MED ORDER — IOHEXOL 350 MG/ML SOLN
INTRAVENOUS | Status: DC | PRN
  Administered 2023-03-25: 80 mL

## 2023-03-25 MED ORDER — LIDOCAINE HCL (PF) 1 % IJ SOLN
INTRAMUSCULAR | Status: DC | PRN
  Administered 2023-03-25 (×3): 2 mL via INTRADERMAL

## 2023-03-25 MED ORDER — SODIUM CHLORIDE 0.9% FLUSH: 3.0000 mL | INTRAVENOUS | Status: DC | PRN

## 2023-03-25 MED ORDER — SODIUM CHLORIDE 0.9 % IV SOLN: 250.0000 mL | INTRAVENOUS | Status: DC | PRN

## 2023-03-25 MED ORDER — ACETAMINOPHEN 325 MG PO TABS: 650.0000 mg | ORAL_TABLET | ORAL | Status: DC | PRN

## 2023-03-25 MED ORDER — LIDOCAINE 2% (20 MG/ML) 5 ML SYRINGE
INTRAMUSCULAR | Status: DC | PRN
  Administered 2023-03-25: 60 mg via INTRAVENOUS

## 2023-03-25 MED ORDER — SODIUM CHLORIDE 0.9% FLUSH: 3.0000 mL | Freq: Two times a day (BID) | INTRAVENOUS | Status: DC

## 2023-03-25 MED ORDER — FENTANYL CITRATE (PF) 100 MCG/2ML IJ SOLN
INTRAMUSCULAR | Status: AC
  Filled 2023-03-25: qty 2

## 2023-03-25 MED ORDER — HEPARIN (PORCINE) IN NACL 1000-0.9 UT/500ML-% IV SOLN
INTRAVENOUS | Status: DC | PRN
  Administered 2023-03-25 (×2): 500 mL

## 2023-03-25 MED ORDER — HEPARIN SODIUM (PORCINE) 1000 UNIT/ML IJ SOLN
INTRAMUSCULAR | Status: AC
  Filled 2023-03-25: qty 10

## 2023-03-25 MED ORDER — MIDAZOLAM HCL 2 MG/2ML IJ SOLN
INTRAMUSCULAR | Status: AC
  Filled 2023-03-25: qty 2

## 2023-03-25 MED ORDER — PHENYLEPHRINE 80 MCG/ML (10ML) SYRINGE FOR IV PUSH (FOR BLOOD PRESSURE SUPPORT)
PREFILLED_SYRINGE | INTRAVENOUS | Status: DC | PRN
  Administered 2023-03-25: 80 ug via INTRAVENOUS

## 2023-03-25 MED ORDER — PROPOFOL 500 MG/50ML IV EMUL
INTRAVENOUS | Status: DC | PRN
  Administered 2023-03-25: 100 ug/kg/min via INTRAVENOUS

## 2023-03-25 MED ORDER — HEPARIN SODIUM (PORCINE) 1000 UNIT/ML IJ SOLN
INTRAMUSCULAR | Status: DC | PRN
  Administered 2023-03-25: 3000 [IU] via INTRAVENOUS

## 2023-03-25 MED ORDER — FUROSEMIDE 10 MG/ML IJ SOLN
INTRAMUSCULAR | Status: DC | PRN
  Administered 2023-03-25: 20 mg via INTRAVENOUS

## 2023-03-25 MED ORDER — VERAPAMIL HCL 2.5 MG/ML IV SOLN
INTRAVENOUS | Status: AC
  Filled 2023-03-25: qty 2

## 2023-03-25 MED ORDER — LABETALOL HCL 5 MG/ML IV SOLN: 10.0000 mg | INTRAVENOUS | Status: DC | PRN

## 2023-03-25 MED ORDER — PROPOFOL 10 MG/ML IV BOLUS
INTRAVENOUS | Status: DC | PRN
  Administered 2023-03-25 (×2): 20 mg via INTRAVENOUS

## 2023-03-25 MED ORDER — HYDRALAZINE HCL 20 MG/ML IJ SOLN: 10.0000 mg | INTRAMUSCULAR | Status: DC | PRN

## 2023-03-25 SURGICAL SUPPLY — 12 items
CATH BALLN WEDGE 5F 110CM (CATHETERS) IMPLANT
CATH INFINITI 5FR MULTPACK ANG (CATHETERS) IMPLANT
DEVICE RAD COMP TR BAND LRG (VASCULAR PRODUCTS) IMPLANT
GLIDESHEATH SLEND SS 6F .021 (SHEATH) IMPLANT
GUIDEWIRE .025 260CM (WIRE) IMPLANT
GUIDEWIRE INQWIRE 1.5J.035X260 (WIRE) IMPLANT
INQWIRE 1.5J .035X260CM (WIRE) ×1
KIT HEART LEFT (KITS) ×2 IMPLANT
PACK CARDIAC CATHETERIZATION (CUSTOM PROCEDURE TRAY) ×2 IMPLANT
SHEATH GLIDE SLENDER 4/5FR (SHEATH) IMPLANT
TRANSDUCER W/STOPCOCK (MISCELLANEOUS) ×2 IMPLANT
TUBING CIL FLEX 10 FLL-RA (TUBING) ×2 IMPLANT

## 2023-03-25 NOTE — CV Procedure (Signed)
BRIEF R&LHC NOTE  R Brachial IV -> 5 Fr sheat - 5Fr Swan -> pressures measured & PA Sat obtained -- removed (initially appempted L Brachial IV - 5 Fr sheat - but 2/2 ICD, unable to advance Swan)  LHC-Cors & grafts: L Radial -> JR4 (SVG-RPDA, SVG-OM1, LIMA-LAD & LVPressures/AoV pullback); JL 4 - LCA  Both Venous removed with manual pressure held Left radial sheath removed with TR band placement: 14 mL air, 11:50 AM, unable to obtain Barbeau   FINDINGS Stable, multivessel disease with no major change. Native coronary and graft anatomy from previous catheterization: Known occlusion of the native RCA, patent SVG-PDA with ostial and proximal ~50% stenosis proximal to a valve.\ Distal LEFT MAIN 70% stenosis involving ostial LCx: Competitive flow in the native LCx with widely patent SVG-proxLCx with 2 major Mrg branches. ~50% proximal LAD at major bifurcating 1st Diag branch, this is followed by relatively normal vessel with 3 septal perforators and a small all 2nd Diag before since subtotal lesion with mild competitive flow from patent LIMA-LAD.  RIGHT & LEFT HEART CATH: RAP mean 10 mmHg with V wave of 15 mmHg; RV P-EDP 60/2-12 mmHg  PAP-mean 63/35-46 mmHg; PCWP mean 27 mm with a V wave of 35 mmHg LV P-EDP 127/9-19 mmHg, AO P-MAP 122/71-91 mmHg Ao sat 94%, PA sat 44% => CO-CI (Fick) 2.45-1.5 (significantly reduced)    RECOMMENDATIONS: Given IV Lasix 40 mg in the Cath Lab since he has not taken his home meds and been giving IV fluids.  Pressures are somewhat elevated. Continue home medication regimen and rearrange follow-up with Dr. Excell Seltzer in the structural heart clinic for TEER evaluation.    Bryan Lemma, MD

## 2023-03-25 NOTE — Anesthesia Procedure Notes (Signed)
Procedure Name: MAC Date/Time: 03/25/2023 8:20 AM  Performed by: Colon Flattery, CRNAPre-anesthesia Checklist: Patient identified, Emergency Drugs available, Suction available and Patient being monitored Patient Re-evaluated:Patient Re-evaluated prior to induction Oxygen Delivery Method: Nasal cannula Preoxygenation: Pre-oxygenation with 100% oxygen Induction Type: IV induction Placement Confirmation: positive ETCO2 Dental Injury: Teeth and Oropharynx as per pre-operative assessment

## 2023-03-25 NOTE — Anesthesia Postprocedure Evaluation (Signed)
Anesthesia Post Note  Patient: [name] [name]  Procedure(s) Performed: TRANSESOPHAGEAL ECHOCARDIOGRAM     Patient location during evaluation: PACU Anesthesia Type: MAC Level of consciousness: awake and alert Pain management: pain level controlled Vital Signs Assessment: post-procedure vital signs reviewed and stable Respiratory status: spontaneous breathing, nonlabored ventilation and respiratory function stable Cardiovascular status: stable and blood pressure returned to baseline Anesthetic complications: no   No notable events documented.  Last Vitals:  Vitals:   03/25/23 1154 03/25/23 1159  BP: (!) 163/69   Pulse: 87 (!) 0  Resp: 17 (!) 21  Temp:    SpO2:      Last Pain:  Vitals:   03/25/23 1213  TempSrc:   PainSc: 0-No pain                 Beryle Lathe

## 2023-03-25 NOTE — Interval H&P Note (Signed)
History and Physical Interval Note:  03/25/2023 10:37 AM  [name] [name]  has presented today for surgery, with the diagnosis of MR.  The various methods of treatment have been discussed with the patient and family. After consideration of risks, benefits and other options for treatment, the patient has consented to  Procedure(s): RIGHT/LEFT HEART CATH AND CORONARY/GRAFT ANGIOGRAPHY (N/A) as a surgical intervention.  The patient's history has been reviewed, patient examined, no change in status, stable for surgery.  I have reviewed the patient's chart and labs.  Questions were answered to the patient's satisfaction.     Bryan Lemma

## 2023-03-25 NOTE — CV Procedure (Signed)
    TRANSESOPHAGEAL ECHOCARDIOGRAM   NAME:  [name] [name]    MRN: [id number] DOB:  December 12, 1945    ADMIT DATE: 03/25/2023  INDICATIONS: Mitral regurgitation  PROCEDURE:   Informed consent was obtained prior to the procedure. The risks, benefits and alternatives for the procedure were discussed and the patient comprehended these risks.  Risks include, but are not limited to, cough, sore throat, vomiting, nausea, somnolence, esophageal and stomach trauma or perforation, bleeding, low blood pressure, aspiration, pneumonia, infection, trauma to the teeth and death.    Procedural time out performed. The oropharynx was anesthetized with topical 1% benzocaine.    Anesthesia was administered by Dr. Mal Amabile and team.  The patient's heart rate, blood pressure, and oxygen saturation are monitored continuously during the procedure. The period of conscious sedation is 20 minutes, of which I was present face-to-face 100% of this time.   The transesophageal probe was inserted in the esophagus and stomach without difficulty and multiple views were obtained.   COMPLICATIONS:    There were no immediate complications.  KEY FINDINGS:  Severe mitral regurgitation; ventricular functional related to posterior restriction with a small flail segment. Moderately decrease LV function.  Full report to follow. Further management per primary team.  Discussed with family.  Riley Lam, MD [place]  [facility]  8:52 AM

## 2023-03-25 NOTE — Interval H&P Note (Signed)
History and Physical Interval Note:  03/25/2023 7:47 AM  [name] [name]  has presented today for surgery, with the diagnosis of MITRAL REGURGITATION.  The various methods of treatment have been discussed with the patient and family. After consideration of risks, benefits and other options for treatment, the patient has consented to  Procedure(s): TRANSESOPHAGEAL ECHOCARDIOGRAM (N/A) as a surgical intervention.  The patient's history has been reviewed, patient examined, no change in status, stable for surgery.  I have reviewed the patient's chart and labs.  Questions were answered to the patient's satisfaction.     Taegen Delker A Xan Sparkman

## 2023-03-25 NOTE — Transfer of Care (Signed)
Immediate Anesthesia Transfer of Care Note  Patient: [name] [name]  Procedure(s) Performed: TRANSESOPHAGEAL ECHOCARDIOGRAM  Patient Location: PACU  Anesthesia Type:MAC  Level of Consciousness: drowsy and patient cooperative  Airway & Oxygen Therapy: Patient Spontanous Breathing and Patient connected to nasal cannula oxygen  Post-op Assessment: Report given to RN, Post -op Vital signs reviewed and stable, and Patient moving all extremities  Post vital signs: Reviewed and stable  Last Vitals:  Vitals Value Taken Time  BP    Temp    Pulse    Resp    SpO2      Last Pain:  Vitals:   03/25/23 0724  TempSrc: Temporal  PainSc: 0-No pain         Complications: No notable events documented.

## 2023-03-28 ENCOUNTER — Telehealth (HOSPITAL_COMMUNITY): Payer: Self-pay

## 2023-03-28 ENCOUNTER — Encounter (HOSPITAL_COMMUNITY): Payer: Self-pay | Admitting: Internal Medicine

## 2023-03-28 ENCOUNTER — Telehealth: Payer: Self-pay

## 2023-03-28 NOTE — Telephone Encounter (Signed)
Per [employer] review,  "For patient GP: This is Secondary MR and does not require a surgical consult  This valve looks suitable for a MitraClip implant. In the and SAXB view, the fossa looks reasonable for a transseptal puncture. The LA dimensions are large enough for steering and straddle of the Clip Delivery System. The MR jet is broad based and central-laterally located. The posterior leaflet measures about 1.3 cm in the 123 LVOT grasping view. Gradient is 3 mmHg at a HR of 86 bpm. MVA measures 5.87 cm2. Based on this information, I'd recommend placing an XTW or NTW at A2/P2, reassessing for gradient & residual MR."

## 2023-03-28 NOTE — Telephone Encounter (Signed)
Called and spoke with wife. Patient scheduled for Thursday 070/11 at 1.40 pm with Dr. Shirlee Latch, wife stated if daughter can't drive them she will call back to let us know and we can reschedule.

## 2023-03-29 ENCOUNTER — Telehealth: Payer: Self-pay

## 2023-03-29 MED FILL — Verapamil HCl IV Soln 2.5 MG/ML: INTRAVENOUS | Qty: 2 | Status: AC

## 2023-03-29 MED FILL — Midazolam HCl Inj 2 MG/2ML (Base Equivalent): INTRAMUSCULAR | Qty: 2 | Status: AC

## 2023-03-29 MED FILL — Fentanyl Citrate Preservative Free (PF) Inj 100 MCG/2ML: INTRAMUSCULAR | Qty: 2 | Status: AC

## 2023-03-29 NOTE — Telephone Encounter (Addendum)
Reviewed the patient's imaging in Valve Team meeting this morning.  [name] [name] anatomy is amenable to mTEER (MitraClip). MitraClip timing will depend on CHF evaluation/med optimization.  Will contact the patient after CHF consult for planning.

## 2023-03-31 ENCOUNTER — Encounter (HOSPITAL_COMMUNITY): Payer: Self-pay | Admitting: Cardiology

## 2023-03-31 ENCOUNTER — Other Ambulatory Visit (HOSPITAL_COMMUNITY): Payer: Self-pay

## 2023-03-31 ENCOUNTER — Ambulatory Visit (HOSPITAL_COMMUNITY)
Admission: RE | Admit: 2023-03-31 | Discharge: 2023-03-31 | Disposition: A | Discharge status: Home / Self Care | Payer: No Typology Code available for payment source | Source: Ambulatory Visit | Attending: Cardiology | Admitting: Cardiology

## 2023-03-31 VITALS — BP 120/78 | HR 89 | Wt 125.2 lb

## 2023-03-31 DIAGNOSIS — Z951 Presence of aortocoronary bypass graft: Secondary | ICD-10-CM | POA: Diagnosis not present

## 2023-03-31 DIAGNOSIS — Z79899 Other long term (current) drug therapy: Secondary | ICD-10-CM | POA: Insufficient documentation

## 2023-03-31 DIAGNOSIS — I2510 Atherosclerotic heart disease of native coronary artery without angina pectoris: Secondary | ICD-10-CM | POA: Insufficient documentation

## 2023-03-31 DIAGNOSIS — I340 Nonrheumatic mitral (valve) insufficiency: Secondary | ICD-10-CM | POA: Diagnosis not present

## 2023-03-31 DIAGNOSIS — I5022 Chronic systolic (congestive) heart failure: Secondary | ICD-10-CM | POA: Insufficient documentation

## 2023-03-31 DIAGNOSIS — Z95820 Peripheral vascular angioplasty status with implants and grafts: Secondary | ICD-10-CM | POA: Insufficient documentation

## 2023-03-31 DIAGNOSIS — I110 Hypertensive heart disease with heart failure: Secondary | ICD-10-CM | POA: Diagnosis not present

## 2023-03-31 DIAGNOSIS — I480 Paroxysmal atrial fibrillation: Secondary | ICD-10-CM | POA: Diagnosis not present

## 2023-03-31 DIAGNOSIS — Z7901 Long term (current) use of anticoagulants: Secondary | ICD-10-CM | POA: Insufficient documentation

## 2023-03-31 DIAGNOSIS — F17290 Nicotine dependence, other tobacco product, uncomplicated: Secondary | ICD-10-CM | POA: Diagnosis not present

## 2023-03-31 DIAGNOSIS — I255 Ischemic cardiomyopathy: Secondary | ICD-10-CM | POA: Diagnosis not present

## 2023-03-31 DIAGNOSIS — I739 Peripheral vascular disease, unspecified: Secondary | ICD-10-CM | POA: Diagnosis not present

## 2023-03-31 DIAGNOSIS — I493 Ventricular premature depolarization: Secondary | ICD-10-CM | POA: Diagnosis not present

## 2023-03-31 LAB — BASIC METABOLIC PANEL
Anion gap: 10 (ref 5–15)
BUN: 14 mg/dL (ref 8–23)
CO2: 27 mmol/L (ref 22–32)
Calcium: 9 mg/dL (ref 8.9–10.3)
Chloride: 92 mmol/L — ABNORMAL LOW (ref 98–111)
Creatinine, Ser: 0.87 mg/dL (ref 0.61–1.24)
GFR, Estimated: >60 mL/min (ref >60)
Glucose, Bld: 255 mg/dL — ABNORMAL HIGH (ref 70–99)
Potassium: 4.3 mmol/L (ref 3.5–5.1)
Sodium: 129 mmol/L — ABNORMAL LOW (ref 135–145)

## 2023-03-31 LAB — BRAIN NATRIURETIC PEPTIDE: B Natriuretic Peptide: 1713.6 pg/mL — ABNORMAL HIGH (ref 0.0–100.0)

## 2023-03-31 MED ORDER — SPIRONOLACTONE 25 MG PO TABS: 12.5000 mg | ORAL_TABLET | Freq: Every day | ORAL | 3 refills | Status: DC

## 2023-03-31 MED ORDER — FUROSEMIDE 20 MG PO TABS: 40.0000 mg | ORAL_TABLET | Freq: Every day | ORAL | 3 refills | Status: DC

## 2023-03-31 MED ORDER — DAPAGLIFLOZIN PROPANEDIOL 10 MG PO TABS: 10.0000 mg | ORAL_TABLET | Freq: Every day | ORAL | 11 refills | Status: DC

## 2023-03-31 NOTE — Patient Instructions (Signed)
INCREASE Lasix 40 mg daily.  START Spironolactone 12.5 mg ( 1/2 Tab ) daily.  START Farxiga 10 mg daily.  Labs done today, your results will be available in MyChart, we will contact you for abnormal readings.  Repeat blood work in 10 days.  Your physician recommends that you schedule a follow-up appointment in: 5 weeks.  If you have any questions or concerns before your next appointment please send [place] a message through [place] or call our office at [phone].    TO LEAVE A MESSAGE FOR THE NURSE SELECT OPTION 2, PLEASE LEAVE A MESSAGE INCLUDING: YOUR NAME DATE OF BIRTH CALL BACK NUMBER REASON FOR CALL**this is important as we prioritize the call backs  YOU WILL RECEIVE A CALL BACK THE SAME DAY AS LONG AS YOU CALL BEFORE 4:00 PM  At the [facility], you and your health needs are our priority. As part of our continuing mission to provide you with exceptional heart care, we have created designated Provider Care Teams. These Care Teams include your primary Cardiologist (physician) and Advanced Practice Providers (APPs- Physician Assistants and Nurse Practitioners) who all work together to provide you with the care you need, when you need it.   You may see any of the following providers on your designated Care Team at your next follow up: Dr Arvilla Meres Dr Marca Ancona Dr. Marcos Eke, NP Robbie Lis, [place] [facility] [place], [place] Brynda Peon, NP Karle Plumber, PharmD   Please be sure to bring in all your medications bottles to every appointment.    Thank you for choosing  [facility]

## 2023-04-01 NOTE — Progress Notes (Signed)
PCP: [facility] Cardiology: Dr. Herbie [place] HF Cardiology: Dr. Shirlee Latch  77 y.o. with history of CAD s/p CABG, ischemic cardiomyopathy, PAD s/p aortobifemoral bypass, PAF, and severe mitral regurgitation was referred by Dr. Excell Seltzer for CHF evaluation prior to mTEER.  Patient had MI in 2/14 with cath showing severe 3VD.  CABG in 2/14 with LIMA-LAD, SVG-PDA, SVG-OM1. Subsequently developed ischemic cardiomyopathy.  He had St Jude CRT-D device placed.  Echo in 1/24 showed EF 40-45%, basal-mid AL and IL AK, basal inferior AK, mild LVH, moderate RV dysfunction with PASP 67 mmHG, severe LAE, severe infarct-related MR, mild-moderate TR.  He also has history of aortobifemoral bypass in 2014.  He had left SFA stent in 2022 for tissue loss in the left great toe. 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  Had breakthrough atrial fibrillation on sotalol with inappropriate ICD shock and is now off sotalol. He has been on mexiletine for frequent PVCs.   Patient's mitral regurgitation has been evaluated recently by Dr. Excell Seltzer for possible mTEER.  Cath in 7/24 showed elevated filling pressures and low cardiac output.  Bypass grafts were patent. TEE in 7/24 showed EF 30-35%, severe MR with restricted posterior leaflet and small flail segment in posterior leaflet, RV normal, mild AI.   Patient is frail-appearing, comes to the office in a wheelchair. He says he gets around the house with a walker and has a motorized scooter for the yard.  Legs are weak and will "give out" easily. He has had foot wounds in the past from PAD but these have healed.  He is wearing compression stockings today. Appetite is poor.  Weight has generally trended down.  He is mildly short of breath walking around the house with his walker.  He has chronic orthopnea and sleeps in a recliner.  Rare lightheadedness. No chest pain.  He was on valsartan in the past but this was stopped due to elevated K.    ECG (personally reviewed): NSR, BiV pacing with PVCs  Labs (6/24): hgb 13.9, digoxin level < 0.4, K 4.7, creatinine 0.75  St Jude device interrogation: recent volume overload but recently impedance trending higher, BiV pacing 91% of the time, < 1% atrial fibrillation.   PMH: 1. CAD: s/p CABG.  MI 2/14, severe 3VD on cath.  CABG with LIMA-LAD, SVG-PDA, SVG-OM1.   - LHC (7/24): occluded RCA, patent SVG-RCA, 75% dLM/ostial LCx, occluded mLAD, patent SVG-OM, patent LIMA-LAD.  2. H/o VT 3. Chronic systolic CHF: Ischemic cardiomyopathy. St Jude CRT-D device.  - Echo (12/17): EF 20% - Echo (1/24): EF 40-45%, basal-mid AL and IL AK, basal inferior AK, mild LVH, moderate RV dysfunction with PASP 67 mmHG, severe LAE, severe infarct-related MR, mild-moderate TR.  - RHC (7/24): mean RA 10, PA 63/35 mean 46, mean PCWP 27 with v waves to 35, CI 1.5 F.  - TEE (7/24): EF 30-35%, severe MR with restricted posterior leaflet and small flail segment in posterior leaflet, RV normal, mild AI.  4. PAD: S/p aortobifemoral bypass in 2014, later with left SFA stent in 2022. Followed by Dr. Chestine Spore.  5. Atrial fibrillation: Paroxysmal. Failed amiodarone due to lung toxicity.  Failed sotalol.  H/o inappropriate ICD shocks.  6. HTN 7. Gout  8. 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  Social History   Socioeconomic History   Marital status: Married    Spouse name: Not on file   Number of children: Not on file   Years of education: Not on  file   Highest education level: Not on file  Occupational History   Not on file  Tobacco Use   Smoking status: Every Day    Types: Pipe    Passive exposure: Never   Smokeless tobacco: Former    Types: Snuff, Chew   Tobacco comments:    09/03/2016 "used smokeless tobacco in my teens; quit smoking cigarettes in the early 1980s; smokes pipe only"  Vaping Use   Vaping status: Never Used  Substance and Sexual Activity   Alcohol use: No   Drug use: No   Sexual activity: Not  on file  Other Topics Concern   Not on file  Social History Narrative   Routine activity around the house, but is limited more by musculoskeletal leg pain and weakness.      He enjoys smoking a pipe-long ago, he quit cigarettes   Social Determinants of Health   Financial Resource Strain: Not on file  Food Insecurity: No Food Insecurity (02/18/2023)   Hunger Vital Sign    Worried About Running Out of Food in the Last Year: Never true    Ran Out of Food in the Last Year: Never true  Transportation Needs: No Transportation Needs (02/18/2023)   PRAPARE - Administrator, Civil Service (Medical): No    Lack of Transportation (Non-Medical): No  Physical Activity: Not on file  Stress: Not on file  Social Connections: Not on file  Intimate Partner Violence: Not At Risk (02/18/2023)   Humiliation, Afraid, Rape, and Kick questionnaire    Fear of Current or Ex-Partner: No    Emotionally Abused: No    Physically Abused: No    Sexually Abused: No   Family History  Problem Relation Age of Onset   Cancer Brother        esophageal   ROS: All systems reviewed and negative except as per HPI.   Current Outpatient Medications  Medication Sig Dispense Refill   acetaminophen (TYLENOL) 650 MG CR tablet Take 650 mg by mouth every 8 (eight) hours as needed for pain.     albuterol (VENTOLIN HFA) 108 (90 Base) MCG/ACT inhaler Inhale 1-2 puffs into the lungs every 6 (six) hours as needed (COPD).     Alpha-Lipoic Acid 600 MG TABS Take 600 mg by mouth daily.     apixaban (ELIQUIS) 5 MG TABS tablet Take 1 tablet (5 mg total) by mouth 2 (two) times daily. 60 tablet 5   aspirin EC 81 MG tablet Take 81 mg by mouth daily.     atorvastatin (LIPITOR) 80 MG tablet Take 40 mg by mouth every evening.     bacitracin ointment Apply 1 Application topically 2 (two) times daily as needed for wound care.     carvedilol (COREG) 3.125 MG tablet Take 1 tablet (3.125 mg total) by mouth 2 (two) times daily. 180  tablet 3   cholecalciferol (VITAMIN D) 25 MCG (1000 UNIT) tablet Take 2,000 Units by mouth every evening.     dapagliflozin propanediol (FARXIGA) 10 MG TABS tablet Take 1 tablet (10 mg total) by mouth daily before breakfast. 30 tablet 11   dicloxacillin (DYNAPEN) 250 MG capsule Take 2 capsules (500 mg total) by mouth 3 (three) times daily. (Patient taking differently: Take 500 mg by mouth 2 (two) times daily.) 180 capsule 0   digoxin (LANOXIN) 0.125 MG tablet Take 1 tablet (0.125 mg total) by mouth daily. Monday through Friday only, None on Saturday and Sunday (Patient taking differently: Take 0.125 mg  by mouth See admin instructions. Monday through Friday only in the morning. None on Saturday and Sunday) 20 tablet 5   diphenhydrAMINE (BENADRYL) 25 mg capsule Take 25 mg by mouth every 6 (six) hours as needed for sleep.     fexofenadine (ALLEGRA) 180 MG tablet Take 180 mg by mouth every evening.     fluticasone (FLONASE) 50 MCG/ACT nasal spray Place 2 sprays into both nostrils daily as needed for allergies or rhinitis.     glipiZIDE (GLUCOTROL) 5 MG tablet Take 5 mg by mouth 2 (two) times daily before a meal.     lidocaine (LIDODERM) 5 % Place 1 patch onto the skin daily as needed (For pain).     magnesium oxide (MAG-OX) 400 (241.3 Mg) MG tablet Take 1 tablet (400 mg total) by mouth daily. 30 tablet 6   metFORMIN (GLUCOPHAGE) 1000 MG tablet Take 1 tablet (1,000 mg total) by mouth 2 (two) times daily with a meal. Resume on 09/06/2016     mexiletine (MEXITIL) 150 MG capsule Take 1 capsule (150 mg total) by mouth every 12 (twelve) hours. 60 capsule 5   nitroGLYCERIN (NITROSTAT) 0.4 MG SL tablet Place 1 tablet (0.4 mg total) under the tongue every 5 (five) minutes x 3 doses as needed for chest pain. 25 tablet 3   Nutritional Supplements (ENSURE PLUS PO) Take 1 Can by mouth 2 (two) times daily.     OVER THE COUNTER MEDICATION Take 1 tablet by mouth See admin instructions. Relaxium every other night      pantoprazole (PROTONIX) 40 MG tablet Take 1 tablet (40 mg total) by mouth daily. 90 tablet 2   Tiotropium Bromide Monohydrate (SPIRIVA RESPIMAT) 2.5 MCG/ACT AERS Inhale 2 puffs into the lungs at bedtime.     Vitamins A & D (VITAMIN A & D) ointment Apply 1 Application topically as needed for dry skin (on bottoms).     furosemide (LASIX) 20 MG tablet Take 2 tablets (40 mg total) by mouth daily. 90 tablet 3   spironolactone (ALDACTONE) 25 MG tablet Take 0.5 tablets (12.5 mg total) by mouth daily. 45 tablet 3   No current facility-administered medications for this encounter.   BP 120/78 (BP Location: Right Arm, Patient Position: Sitting, Cuff Size: Normal)   Pulse 89   Wt 56.8 kg (125 lb 3.2 oz)   SpO2 96%   BMI 20.21 kg/m  General: NAD, frail Neck: JVP 9-10, no thyromegaly or thyroid nodule.  Lungs: Clear to auscultation bilaterally with normal respiratory effort. CV: Nondisplaced PMI.  Heart regular S1/S2, no S3/S4, 3/6 HSM apex.  Trace ankle edema.  No carotid bruit.  Unable to palpate pedal pulses.  Abdomen: Soft, nontender, no hepatosplenomegaly, no distention.  Skin: Intact without lesions or rashes.  Neurologic: Alert and oriented x 3.  Psych: Normal affect. Extremities: No clubbing or cyanosis.  HEENT: Normal.   Assessment/Plan: 1. CAD: s/p CABG in 2014. Cath in 7/24 showed patent grafts.  No chest pain.  - On ASA 81 - Continue atorvastatin.  2. Chronic systolic CHF: Ischemic cardiomyopathy.  St Jude CRT-D device.  Last study was a TEE in 7/24 showing EF 30-35%, severe MR with restricted posterior leaflet and small flail segment in posterior leaflet, RV normal, mild AI. Recent RHC showing elevated filling pressures and low cardiac output.  Patient is quite frail, NYHA class IIIb symptoms.  Limited to activity around the house.  I suspect he is limited by advanced heart failure/volume overload worsened by ischemic mitral regurgitation  as well as significant deconditioning.  -  Continue Coreg 3.125 mg bid.  - Continue digoxin, check level.  - Increase Lasix to 40 mg daily.  BMET/BNP today, BMET in 1 week.  - Start Farxiga 10 mg daily and spironolactone 12.5 mg daily.  Hopefully with increasing Lasix and adding Farxiga, he will be able to tolerate spironolactone without hyperkalemia.  Follow BMET closely.  - Mitral regurgitation is severe, hopefully mTEER will help him symptomatically.  3. Mitral regurgitation: TEE (7/24) with severe MR with restricted posterior leaflet and small flail segment in posterior leaflet.  Suspect primarily infarct-related MR.  I suspect MR is playing a significant role in his symptomatology.  - Agree with mTEER.  Not surgical candidate.  I will try to get him as optimized medically as possible before procedure.  4. Atrial fibrillation: NSR recently.  Has failed amiodarone (lung toxicity) as well as sotalol (break through).  Currently, not on an antiarrhythmic but in NSR.  - Continue apixaban 5 mg bid.  5. PVCs: Frequent.  Continue mexiletine.  6. PAD: S/p aortobifemoral bypass 2014 then SFA stent 2022.  Followed by Dr. Chestine Spore.  Had left foot ulcerations that have healed.  - On digoxin and Coreg to control rate if he enters AF.   Followup in 3 wks.   Marca Ancona 04/01/2023

## 2023-04-04 ENCOUNTER — Telehealth (HOSPITAL_COMMUNITY): Payer: Self-pay | Admitting: Cardiology

## 2023-04-04 MED ORDER — DAPAGLIFLOZIN PROPANEDIOL 10 MG PO TABS: 10.0000 mg | ORAL_TABLET | Freq: Every day | ORAL | 11 refills | Status: DC

## 2023-04-04 NOTE — Addendum Note (Signed)
Addended by: Theresia Bough on: 04/04/2023 03:24 PM   Modules accepted: Orders

## 2023-04-04 NOTE — Telephone Encounter (Signed)
Community care referral faxed to [facility] and OV faxed to pharmacy team @ [phone] -Dr Louis Meckel

## 2023-04-11 ENCOUNTER — Ambulatory Visit
Admission: RE | Admit: 2023-04-11 | Discharge: 2023-04-11 | Disposition: A | Discharge status: Home / Self Care | Payer: Medicare Other | Source: Ambulatory Visit | Attending: Cardiology | Admitting: Cardiology

## 2023-04-11 ENCOUNTER — Ambulatory Visit (INDEPENDENT_AMBULATORY_CARE_PROVIDER_SITE_OTHER): Payer: Medicare Other

## 2023-04-11 DIAGNOSIS — I5022 Chronic systolic (congestive) heart failure: Secondary | ICD-10-CM | POA: Insufficient documentation

## 2023-04-11 DIAGNOSIS — Z95810 Presence of automatic (implantable) cardiac defibrillator: Secondary | ICD-10-CM

## 2023-04-11 LAB — BASIC METABOLIC PANEL
Anion gap: 9 (ref 5–15)
BUN: 17 mg/dL (ref 8–23)
CO2: 26 mmol/L (ref 22–32)
Calcium: 9.1 mg/dL (ref 8.9–10.3)
Chloride: 94 mmol/L — ABNORMAL LOW (ref 98–111)
Creatinine, Ser: 0.89 mg/dL (ref 0.61–1.24)
GFR, Estimated: >60 mL/min (ref >60)
Glucose, Bld: 206 mg/dL — ABNORMAL HIGH (ref 70–99)
Potassium: 4.5 mmol/L (ref 3.5–5.1)
Sodium: 129 mmol/L — ABNORMAL LOW (ref 135–145)

## 2023-04-11 MED ORDER — FUROSEMIDE 20 MG PO TABS: ORAL_TABLET | ORAL | Status: DC

## 2023-04-11 NOTE — Progress Notes (Signed)
EPIC Encounter for ICM Monitoring  Patient Name: [name] [name] is a 77 y.o. male Date: 04/11/2023 Primary Care Physican: [facility] Primary Cardiologist: [facility] Electrophysiologist: Rae Roam Pacing: 92%     08/31/2022 Weight: 127 lbs 10/13/2022 Weight: 130 lbs 01/28/2023 Weight: 125 lbs 04/11/2023 Weight: 116 lbs   AT/AF Burden 0%     Spoke with daughter [name] [name] per DPR.  She stated patient's weight has dropped to 116 lbs and appetite has increased.  She reports he is feeling okay at this time and due to have labs drawn today for 7/24 procedure.       Corvue thoracic impedance suggesting possible dryness starting 7/12.   Prescribed:  Furosemide 20 mg Take 2 tablet(s) (40 mg total) by mouth daily. (Increased dosage started 7/11 at HF clinic OV)    Spironolactone 25 mg take 0.5 tablet (12.5 mg total) by mouth daily   Labs: 04/11/2023 BMET SCHEDULED at 1:45 PM TODAY 03/08/2023 Creatinine 0.75, BUN 9,   Potassium 4.7, Sodium 130, GFR 94  03/03/2023 Creatinine 0.72, BUN 10, Potassium 5.6, Sodium 128, GFR 95  02/18/2023 Creatinine 0.92, BUN 16, Potassium 3.6, Sodium 129  11/20/2021 Creatinine 0.86, BUN 8,   Potassium 5.2, Sodium 131, GFR 91 11/11/2021 Creatinine 0.88, BUN 10, Potassium 5.5, Sodium 128, GFR 91 10/07/2021 Creatinine 0.86, BUN 8,   Potassium 5.3, Sodium 131, GFR 91 A complete set of results can be found in Results Review.   Recommendations:  Daughter confirmed patient is taking Furosemide 40 mg daily since OV and patient usually takes dosage about 11:30 AM.  Advised to hold the lasix for a couple of hours to give Dr Shirlee Latch a chance to review.   Follow-up plan: ICM clinic phone appointment on 04/18/2023 to recheck fluid levels.   91 day device clinic remote transmission 07/15/2023.     EP/Cardiology Office Visits:  05/02/2023 with Dr Shirlee Latch.  06/03/2023 with Dr Ladona Ridgel.   Copy of ICM check sent to Dr. Ladona Ridgel.  Copy sent to Dr Shirlee Latch for review and  recommendations.    3 month ICM trend: 04/11/2023.    12-14 Month ICM trend:     Karie Soda, RN 04/11/2023 10:45 AM

## 2023-04-11 NOTE — Progress Notes (Signed)
Spoke with daughter and advised Dr Shirlee Latch recommended to decrease Furosemide to 2 tablets (40 mg total) by mouth every other day alternating with 1 tablet (20 mg total) ever other day.  She agreed with plan and will adjust patients Lasix dosage starting tomorrow.  He has already taken 40 mg dose today.

## 2023-04-11 NOTE — Progress Notes (Signed)
Can decrease Lasix to 40 daily alternating with 20 daily.

## 2023-04-12 ENCOUNTER — Telehealth (HOSPITAL_COMMUNITY): Payer: Self-pay

## 2023-04-12 MED ORDER — EMPAGLIFLOZIN 25 MG PO TABS: ORAL_TABLET | ORAL | 3 refills | Status: DC

## 2023-04-12 NOTE — Telephone Encounter (Addendum)
Pt aware, agreeable, and verbalized understanding  Also stated [facility] will not cover Marcelline Deist, but will cover Jardiance. Per Dr. Shirlee Latch " Ok to change from [place] to [place]"  ----- Message from Marca Ancona sent at 04/11/2023  4:31 PM EDT ----- Cut back on po fluid intake with low sodium.

## 2023-04-13 ENCOUNTER — Ambulatory Visit: Payer: Medicare Other

## 2023-04-15 ENCOUNTER — Ambulatory Visit (INDEPENDENT_AMBULATORY_CARE_PROVIDER_SITE_OTHER): Payer: Medicare Other

## 2023-04-15 DIAGNOSIS — I472 Ventricular tachycardia, unspecified: Secondary | ICD-10-CM

## 2023-04-18 ENCOUNTER — Ambulatory Visit: Payer: No Typology Code available for payment source | Attending: Internal Medicine

## 2023-04-18 DIAGNOSIS — I5022 Chronic systolic (congestive) heart failure: Secondary | ICD-10-CM

## 2023-04-18 DIAGNOSIS — Z95810 Presence of automatic (implantable) cardiac defibrillator: Secondary | ICD-10-CM

## 2023-04-19 NOTE — Progress Notes (Signed)
EPIC Encounter for ICM Monitoring  Patient Name: [name] [name] is a 77 y.o. male Date: 04/19/2023 Primary Care Physican: [facility] Primary Cardiologist: [facility] Electrophysiologist: Rae Roam Pacing: 92%     08/31/2022 Weight: 127 lbs 10/13/2022 Weight: 130 lbs 01/28/2023 Weight: 125 lbs 04/11/2023 Weight: 116 lbs   AT/AF Burden 0%     Spoke with daughter [name] per DPR.  Pt feeling okay but does describe he has mood swings that range from depressed, to elated, to angry.  She asked if the new meds Farxiga or Spironolactone can cause the mood swings.   Advised to contact Dr Alford Highland office regarding possible side effects.    Corvue thoracic impedance suggesting fluid levels returned to normal after Furosemide dosage decreased to every other day.   Prescribed:  Furosemide 20 mg Take 2 tablet(s) (40 mg total) by mouth every other day alternating with 1 tablet every other day.   Spironolactone 25 mg take 0.5 tablet (12.5 mg total) by mouth daily   Labs: 04/11/2023 Creatinine 0.89, BUN 17, Potassium 4.5, Sodium 129, GFR >60 03/08/2023 Creatinine 0.75, BUN 9,   Potassium 4.7, Sodium 130, GFR 94  03/03/2023 Creatinine 0.72, BUN 10, Potassium 5.6, Sodium 128, GFR 95  02/18/2023 Creatinine 0.92, BUN 16, Potassium 3.6, Sodium 129  11/20/2021 Creatinine 0.86, BUN 8,   Potassium 5.2, Sodium 131, GFR 91 11/11/2021 Creatinine 0.88, BUN 10, Potassium 5.5, Sodium 128, GFR 91 10/07/2021 Creatinine 0.86, BUN 8,   Potassium 5.3, Sodium 131, GFR 91 A complete set of results can be found in Results Review.   Recommendations:  No changes and encouraged to call if experiencing any fluid symptoms.   Follow-up plan: ICM clinic phone appointment on 05/16/2023   91 day device clinic remote transmission 07/15/2023.     EP/Cardiology Office Visits:  05/02/2023 with Dr Shirlee Latch.  06/03/2023 with Dr Ladona Ridgel.   Copy of ICM check sent to Dr. Ladona Ridgel.  3 month ICM trend: 04/18/2023.    12-14  Month ICM trend:     Karie Soda, RN 04/19/2023 1:02 PM

## 2023-04-20 ENCOUNTER — Telehealth (HOSPITAL_COMMUNITY): Payer: Self-pay

## 2023-04-20 NOTE — Telephone Encounter (Signed)
Received call from patients daughter who states that ever since starting spironolactone, and farxiga patient has had some personality changes. Reports patient has went from rage, anger, depresses, to overly happy. Patients daughter concerned that this could be related. Would like to check with Dr. Shirlee Latch regarding this. Will forward message over.

## 2023-04-20 NOTE — Telephone Encounter (Signed)
I agree with Dr. Shirlee Latch. It would be very unusual for either Farxiga or spironolactone to be causing personality changes or mood lability. However, since these symptoms correlated with starting these two medications, I think it would be reasonable to try holding the spironolactone until follow up with Dr. Shirlee Latch on 05/02/23. Would recommend continuing the Farxiga for now and can stop at 05/02/23 visit if mood instability does not improve. Hopefully this will give [place] a better understanding on if the medications are contributing.

## 2023-04-20 NOTE — Telephone Encounter (Signed)
That would be very unusual.  Will review with Lauren and see what she thinks.   Lauren, think any chance this could be related?

## 2023-04-21 NOTE — Telephone Encounter (Signed)
Attempted to call patients daughter- unable to reach. Left message to call back.

## 2023-04-22 NOTE — Progress Notes (Signed)
Remote ICD transmission.   

## 2023-04-25 NOTE — Telephone Encounter (Signed)
Spoke with daughter and gave her Lauren's recommendations. Daughter happy with advice and will hold spironolactone till follow up next week.

## 2023-05-02 ENCOUNTER — Ambulatory Visit (HOSPITAL_COMMUNITY)
Admission: RE | Admit: 2023-05-02 | Discharge: 2023-05-02 | Disposition: A | Discharge status: Home / Self Care | Payer: No Typology Code available for payment source | Source: Ambulatory Visit | Attending: Cardiology | Admitting: Cardiology

## 2023-05-02 VITALS — BP 110/72 | HR 68 | Wt 120.0 lb

## 2023-05-02 DIAGNOSIS — I5022 Chronic systolic (congestive) heart failure: Secondary | ICD-10-CM | POA: Diagnosis not present

## 2023-05-02 DIAGNOSIS — I493 Ventricular premature depolarization: Secondary | ICD-10-CM | POA: Insufficient documentation

## 2023-05-02 DIAGNOSIS — I480 Paroxysmal atrial fibrillation: Secondary | ICD-10-CM | POA: Insufficient documentation

## 2023-05-02 LAB — CBC
HCT: 45.1 % (ref 39.0–52.0)
Hemoglobin: 14.5 g/dL (ref 13.0–17.0)
MCH: 26.7 pg (ref 26.0–34.0)
MCHC: 32.2 g/dL (ref 30.0–36.0)
MCV: 82.9 fL (ref 80.0–100.0)
Platelets: 322 10*3/uL (ref 150–400)
RBC: 5.44 MIL/uL (ref 4.22–5.81)
RDW: 15.7 % — ABNORMAL HIGH (ref 11.5–15.5)
WBC: 8 10*3/uL (ref 4.0–10.5)
nRBC: 0 % (ref 0.0–0.2)

## 2023-05-02 LAB — BASIC METABOLIC PANEL
Anion gap: 12 (ref 5–15)
BUN: 14 mg/dL (ref 8–23)
CO2: 27 mmol/L (ref 22–32)
Calcium: 9.4 mg/dL (ref 8.9–10.3)
Chloride: 93 mmol/L — ABNORMAL LOW (ref 98–111)
Creatinine, Ser: 0.89 mg/dL (ref 0.61–1.24)
GFR, Estimated: >60 mL/min (ref >60)
Glucose, Bld: 156 mg/dL — ABNORMAL HIGH (ref 70–99)
Potassium: 4.5 mmol/L (ref 3.5–5.1)
Sodium: 132 mmol/L — ABNORMAL LOW (ref 135–145)

## 2023-05-02 LAB — DIGOXIN LEVEL: Digoxin Level: 0.6 ng/mL — ABNORMAL LOW (ref 0.8–2.0)

## 2023-05-02 LAB — BRAIN NATRIURETIC PEPTIDE: B Natriuretic Peptide: 616.1 pg/mL — ABNORMAL HIGH (ref 0.0–100.0)

## 2023-05-02 MED ORDER — SPIRONOLACTONE 25 MG PO TABS: 12.5000 mg | ORAL_TABLET | Freq: Every day | ORAL | Status: DC

## 2023-05-02 MED ORDER — FUROSEMIDE 20 MG PO TABS: 20.0000 mg | ORAL_TABLET | Freq: Every day | ORAL | Status: DC

## 2023-05-02 NOTE — Patient Instructions (Addendum)
Medication Changes:  Decrease Furosemide to 20 mg daily  Restart Spironolactone 12.5 mg (1/2 tab) Daily  Lab Work:  Labs done today, your results will be available in MyChart, we will contact you for abnormal readings.  Your physician recommends that you return for lab work in: 1-2 weeks  Testing/Procedures:  none  Referrals:  none  Special Instructions // Education:  Do the following things EVERYDAY: Weigh yourself in the morning before breakfast. Write it down and keep it in a log. Take your medicines as prescribed Eat low salt foods--Limit salt (sodium) to 2000 mg per day.  Stay as active as you can everyday Limit all fluids for the day to less than 2 liters   Follow-Up in: 6 weeks  Keep follow-up with Dr Excell Seltzer on 07/29/23 as scheduled   At the [facility], you and your health needs are our priority. We have a designated team specialized in the treatment of Heart Failure. This Care Team includes your primary Heart Failure Specialized Cardiologist (physician), Advanced Practice Providers (APPs- Physician Assistants and Nurse Practitioners), and Pharmacist who all work together to provide you with the care you need, when you need it.   You may see any of the following providers on your designated Care Team at your next follow up:  Dr. Arvilla Meres Dr. Marca Ancona Dr. Marcos Eke, NP Robbie Lis, [place] [facility] [place], [place] Brynda Peon, NP Karle Plumber, PharmD   Please be sure to bring in all your medications bottles to every appointment.   Need to Contact us:  If you have any questions or concerns before your next appointment please send [place] a message through [place] or call our office at [phone].    TO LEAVE A MESSAGE FOR THE NURSE SELECT OPTION 2, PLEASE LEAVE A MESSAGE INCLUDING: YOUR NAME DATE OF BIRTH CALL BACK NUMBER REASON FOR CALL**this is important as we prioritize the call backs  YOU  WILL RECEIVE A CALL BACK THE SAME DAY AS LONG AS YOU CALL BEFORE 4:00 PM

## 2023-05-03 NOTE — Progress Notes (Signed)
PCP: [facility] Cardiology: Dr. Herbie [place] HF Cardiology: Dr. Shirlee Latch  77 y.o. with history of CAD s/p CABG, ischemic cardiomyopathy, PAD s/p aortobifemoral bypass, PAF, and severe mitral regurgitation was referred by Dr. Excell Seltzer for CHF evaluation prior to mTEER.  Patient had MI in 2/14 with cath showing severe 3VD.  CABG in 2/14 with LIMA-LAD, SVG-PDA, SVG-OM1. Subsequently developed ischemic cardiomyopathy.  He had St Jude CRT-D device placed.  Echo in 1/24 showed EF 40-45%, basal-mid AL and IL AK, basal inferior AK, mild LVH, moderate RV dysfunction with PASP 67 mmHG, severe LAE, severe infarct-related MR, mild-moderate TR.  He also has history of aortobifemoral bypass in 2014.  He had left SFA stent in 2022 for tissue loss in the left great toe. 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  Had breakthrough atrial fibrillation on sotalol with inappropriate ICD shock and is now off sotalol. He has been on mexiletine for frequent PVCs.   Patient's mitral regurgitation has been evaluated recently by Dr. Excell Seltzer for possible mTEER.  Cath in 7/24 showed elevated filling pressures and low cardiac output.  Bypass grafts were patent. TEE in 7/24 showed EF 30-35%, severe MR with restricted posterior leaflet and small flail segment in posterior leaflet, RV normal, mild AI.   Patient was started on spironolactone, developed mood changes (episodes of "rage") around that time so family had him stop it.  Mood is back to normal.   Patient returns for followup of CHF. Patient is frail-appearing, comes to the office in a wheelchair. He says he gets around the house with a walker and has a motorized scooter for the yard.  Legs continue to be weak, gives out easily.  Per family, he is sleeping a lot during the day because he is urinating a lot at night though taking Lasix in the morning.  Appetite is better, drinking Ensure.  Weight is down 5 lbs.  No lightheadedness,  no palpitations.  He is short of breath walking longer distances with his walker but more limited by fatigue.  No chest pain.   ECG (personally reviewed): NSR, BiV paced with PVCs  Labs (6/24): hgb 13.9, digoxin level < 0.4, K 4.7, creatinine 0.75 Labs (7/24): K 4.5, creatinine 0.89  St Jude device interrogation: stable thoracic impedance, BiV pacing 91% of the time, no AF/VT   PMH: 1. CAD: s/p CABG.  MI 2/14, severe 3VD on cath.  CABG with LIMA-LAD, SVG-PDA, SVG-OM1.   - LHC (7/24): occluded RCA, patent SVG-RCA, 75% dLM/ostial LCx, occluded mLAD, patent SVG-OM, patent LIMA-LAD.  2. H/o VT 3. Chronic systolic CHF: Ischemic cardiomyopathy. St Jude CRT-D device.  - Echo (12/17): EF 20% - Echo (1/24): EF 40-45%, basal-mid AL and IL AK, basal inferior AK, mild LVH, moderate RV dysfunction with PASP 67 mmHG, severe LAE, severe infarct-related MR, mild-moderate TR.  - RHC (7/24): mean RA 10, PA 63/35 mean 46, mean PCWP 27 with v waves to 35, CI 1.5 F.  - TEE (7/24): EF 30-35%, severe MR with restricted posterior leaflet and small flail segment in posterior leaflet, RV normal, mild AI.  4. PAD: S/p aortobifemoral bypass in 2014, later with left SFA stent in 2022. Followed by Dr. Chestine Spore.  5. Atrial fibrillation: Paroxysmal. Failed amiodarone due to lung toxicity.  Failed sotalol.  H/o inappropriate ICD shocks.  6. HTN 7. Gout  8. 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  Social History   Socioeconomic History   Marital status: Married  Spouse name: Not on file   Number of children: Not on file   Years of education: Not on file   Highest education level: Not on file  Occupational History   Not on file  Tobacco Use   Smoking status: Every Day    Types: Pipe    Passive exposure: Never   Smokeless tobacco: Former    Types: Snuff, Chew   Tobacco comments:    09/03/2016 "used smokeless tobacco in my teens; quit smoking cigarettes in the early 1980s; smokes pipe only"  Vaping Use   Vaping  status: Never Used  Substance and Sexual Activity   Alcohol use: No   Drug use: No   Sexual activity: Not on file  Other Topics Concern   Not on file  Social History Narrative   Routine activity around the house, but is limited more by musculoskeletal leg pain and weakness.      He enjoys smoking a pipe-long ago, he quit cigarettes   Social Determinants of Health   Financial Resource Strain: Not on file  Food Insecurity: No Food Insecurity (02/18/2023)   Hunger Vital Sign    Worried About Running Out of Food in the Last Year: Never true    Ran Out of Food in the Last Year: Never true  Transportation Needs: No Transportation Needs (02/18/2023)   PRAPARE - Administrator, Civil Service (Medical): No    Lack of Transportation (Non-Medical): No  Physical Activity: Not on file  Stress: Not on file  Social Connections: Not on file  Intimate Partner Violence: Not At Risk (02/18/2023)   Humiliation, Afraid, Rape, and Kick questionnaire    Fear of Current or Ex-Partner: No    Emotionally Abused: No    Physically Abused: No    Sexually Abused: No   Family History  Problem Relation Age of Onset   Cancer Brother        esophageal   ROS: All systems reviewed and negative except as per HPI.   Current Outpatient Medications  Medication Sig Dispense Refill   acetaminophen (TYLENOL) 650 MG CR tablet Take 650 mg by mouth every 8 (eight) hours as needed for pain.     albuterol (VENTOLIN HFA) 108 (90 Base) MCG/ACT inhaler Inhale 1-2 puffs into the lungs every 6 (six) hours as needed (COPD).     Alpha-Lipoic Acid 600 MG TABS Take 600 mg by mouth daily.     apixaban (ELIQUIS) 5 MG TABS tablet Take 1 tablet (5 mg total) by mouth 2 (two) times daily. 60 tablet 5   aspirin EC 81 MG tablet Take 81 mg by mouth daily.     atorvastatin (LIPITOR) 80 MG tablet Take 40 mg by mouth every evening.     bacitracin ointment Apply 1 Application topically 2 (two) times daily as needed for wound  care.     carvedilol (COREG) 3.125 MG tablet Take 1 tablet (3.125 mg total) by mouth 2 (two) times daily. 180 tablet 3   cholecalciferol (VITAMIN D) 25 MCG (1000 UNIT) tablet Take 2,000 Units by mouth every evening.     dicloxacillin (DYNAPEN) 250 MG capsule Take 2 capsules (500 mg total) by mouth 3 (three) times daily. (Patient taking differently: Take 500 mg by mouth 2 (two) times daily.) 180 capsule 0   digoxin (LANOXIN) 0.125 MG tablet Take 1 tablet (0.125 mg total) by mouth daily. Monday through Friday only, None on Saturday and Sunday (Patient taking differently: Take 0.125 mg by mouth See  admin instructions. Monday through Friday only in the morning. None on Saturday and Sunday) 20 tablet 5   diphenhydrAMINE (BENADRYL) 25 mg capsule Take 25 mg by mouth every 6 (six) hours as needed for sleep.     empagliflozin (JARDIANCE) 25 MG TABS tablet Take .5mg  Half tablet daily 45 tablet 3   fexofenadine (ALLEGRA) 180 MG tablet Take 180 mg by mouth every evening.     fluticasone (FLONASE) 50 MCG/ACT nasal spray Place 2 sprays into both nostrils daily as needed for allergies or rhinitis.     glipiZIDE (GLUCOTROL) 5 MG tablet Take 5 mg by mouth 2 (two) times daily before a meal.     lidocaine (LIDODERM) 5 % Place 1 patch onto the skin daily as needed (For pain).     magnesium oxide (MAG-OX) 400 (241.3 Mg) MG tablet Take 1 tablet (400 mg total) by mouth daily. 30 tablet 6   metFORMIN (GLUCOPHAGE) 1000 MG tablet Take 1 tablet (1,000 mg total) by mouth 2 (two) times daily with a meal. Resume on 09/06/2016     mexiletine (MEXITIL) 150 MG capsule Take 1 capsule (150 mg total) by mouth every 12 (twelve) hours. 60 capsule 5   nitroGLYCERIN (NITROSTAT) 0.4 MG SL tablet Place 1 tablet (0.4 mg total) under the tongue every 5 (five) minutes x 3 doses as needed for chest pain. 25 tablet 3   Nutritional Supplements (ENSURE PLUS PO) Take 1 Can by mouth 2 (two) times daily.     OVER THE COUNTER MEDICATION Take 1 tablet  by mouth See admin instructions. Relaxium every other night     pantoprazole (PROTONIX) 40 MG tablet Take 1 tablet (40 mg total) by mouth daily. 90 tablet 2   Tiotropium Bromide Monohydrate (SPIRIVA RESPIMAT) 2.5 MCG/ACT AERS Inhale 2 puffs into the lungs at bedtime.     Vitamins A & D (VITAMIN A & D) ointment Apply 1 Application topically as needed for dry skin (on bottoms).     furosemide (LASIX) 20 MG tablet Take 1 tablet (20 mg total) by mouth daily.     spironolactone (ALDACTONE) 25 MG tablet Take 0.5 tablets (12.5 mg total) by mouth daily.     No current facility-administered medications for this encounter.   BP 110/72   Pulse 68   Wt 54.4 kg (120 lb)   SpO2 99%   BMI 19.37 kg/m  General: NAD, frail Neck: No JVD, no thyromegaly or thyroid nodule.  Lungs: Clear to auscultation bilaterally with normal respiratory effort. CV: Nondisplaced PMI.  Heart regular S1/S2, no S3/S4, no murmur.  No peripheral edema.  No carotid bruit.  Normal pedal pulses.  Abdomen: Soft, nontender, no hepatosplenomegaly, no distention.  Skin: Intact without lesions or rashes.  Neurologic: Alert and oriented x 3.  Psych: Normal affect. Extremities: No clubbing or cyanosis.  HEENT: Normal.   Assessment/Plan: 1. CAD: s/p CABG in 2014. Cath in 7/24 showed patent grafts.  No chest pain.  - On ASA 81 - Continue atorvastatin.  2. Chronic systolic CHF: Ischemic cardiomyopathy.  St Jude CRT-D device.  Last study was a TEE in 7/24 showing EF 30-35%, severe MR with restricted posterior leaflet and small flail segment in posterior leaflet, RV normal, mild AI. Recent RHC showing elevated filling pressures and low cardiac output.  Patient is quite frail, NYHA class III symptoms.  Limited to activity around the house.  I suspect he is limited by advanced heart failure/volume overload worsened by ischemic mitral regurgitation as well as significant  deconditioning. On exam and by [facility] today, he is not volume overloaded.   - Continue Coreg 3.125 mg bid.  - Continue digoxin, check level.  - Cut Lasix back to 20 mg daily, patient feels like he is urinating excessively and is having trouble sleeping at night.  - Continue Farxiga 10 mg daily.  - I will try to restart him on spironolactone 12.5 mg daily, I am not sure that spironolactone actually caused his mood problems.  However, if episodes of "rage" re-occur after restarting spironolactone, will stop it. BMET/BNP today, BMET 10 days.  - Mitral regurgitation is severe, hopefully mTEER will help him symptomatically.  3. Mitral regurgitation: TEE (7/24) with severe MR with restricted posterior leaflet and small flail segment in posterior leaflet.  Suspect primarily infarct-related MR.  I suspect MR is playing a significant role in his symptomatology.  - Not surgical candidate.  Volume status is better.  Though he is frail, would recommend proceeding with mTEER.  4. Atrial fibrillation: Paroxysmal. Has failed amiodarone (lung toxicity) as well as sotalol (break through). NSR today.  - Continue apixaban 5 mg bid.  5. PVCs: Frequent.  Continue mexiletine.  6. PAD: S/p aortobifemoral bypass 2014 then SFA stent 2022.  Followed by Dr. Chestine Spore.  Had left foot ulcerations that have healed.   Followup in 6 wks with APP.   Marca Ancona 05/03/2023

## 2023-05-12 ENCOUNTER — Ambulatory Visit (HOSPITAL_COMMUNITY)
Admission: RE | Admit: 2023-05-12 | Discharge: 2023-05-12 | Disposition: A | Discharge status: Home / Self Care | Payer: Medicare Other | Source: Ambulatory Visit | Attending: Internal Medicine | Admitting: Internal Medicine

## 2023-05-12 DIAGNOSIS — I5022 Chronic systolic (congestive) heart failure: Secondary | ICD-10-CM | POA: Diagnosis not present

## 2023-05-12 LAB — BASIC METABOLIC PANEL
Anion gap: 10 (ref 5–15)
BUN: 13 mg/dL (ref 8–23)
CO2: 28 mmol/L (ref 22–32)
Calcium: 9.7 mg/dL (ref 8.9–10.3)
Chloride: 96 mmol/L — ABNORMAL LOW (ref 98–111)
Creatinine, Ser: 0.97 mg/dL (ref 0.61–1.24)
GFR, Estimated: >60 mL/min (ref >60)
Glucose, Bld: 193 mg/dL — ABNORMAL HIGH (ref 70–99)
Potassium: 4.8 mmol/L (ref 3.5–5.1)
Sodium: 134 mmol/L — ABNORMAL LOW (ref 135–145)

## 2023-05-16 ENCOUNTER — Ambulatory Visit: Payer: Medicare Other | Attending: Internal Medicine

## 2023-05-16 DIAGNOSIS — Z95810 Presence of automatic (implantable) cardiac defibrillator: Secondary | ICD-10-CM | POA: Diagnosis not present

## 2023-05-16 DIAGNOSIS — I5022 Chronic systolic (congestive) heart failure: Secondary | ICD-10-CM

## 2023-05-17 NOTE — Progress Notes (Signed)
EPIC Encounter for ICM Monitoring  Patient Name: [name] [name] is a 77 y.o. male Date: 05/17/2023 Primary Care Physican: [facility] Primary Cardiologist: [facility] Electrophysiologist: Rae Roam Pacing: 90%     08/31/2022 Weight: 127 lbs 10/13/2022 Weight: 130 lbs 01/28/2023 Weight: 125 lbs 04/11/2023 Weight: 116 lbs   AT/AF Burden <1%     Spoke with daughter [name] per DPR.  Pt feeling better since medicine change from farxiga to Sultana.  He is not having the mood swings he had before.  Weight was up 1 lb over the weekend but lost 2 pounds yesterday.     [facility] thoracic impedance suggesting fluid levels close to normal baseline.   Prescribed:  Furosemide 20 mg Take 1 tablet(s) (20 mg total) by mouth daily.   Spironolactone 25 mg take 0.5 tablet (12.5 mg total) by mouth daily   Labs: 05/12/2023 Creatinine 0.97, BUN 13, Potassium 4.8, Sodium 134, GFR > 05/02/2023 Creatinine 0.89, BUN 14, Potassium 4.5, Sodium 132, GFR >60 04/11/2023 Creatinine 0.89, BUN 17, Potassium 4.5, Sodium 129, GFR >60 03/08/2023 Creatinine 0.75, BUN 9,   Potassium 4.7, Sodium 130, GFR 94  03/03/2023 Creatinine 0.72, BUN 10, Potassium 5.6, Sodium 128, GFR 95  02/18/2023 Creatinine 0.92, BUN 16, Potassium 3.6, Sodium 129  11/20/2021 Creatinine 0.86, BUN 8,   Potassium 5.2, Sodium 131, GFR 91 11/11/2021 Creatinine 0.88, BUN 10, Potassium 5.5, Sodium 128, GFR 91 10/07/2021 Creatinine 0.86, BUN 8,   Potassium 5.3, Sodium 131, GFR 91 A complete set of results can be found in Results Review.   Recommendations:  No changes and encouraged to call if experiencing any fluid symptoms.   Follow-up plan: ICM clinic phone appointment on 06/27/2023   91 day device clinic remote transmission 07/15/2023.     EP/Cardiology Office Visits:  06/13/2023 with HF Clinic.  06/03/2023 with Dr Ladona Ridgel.   Copy of ICM check sent to Dr. Ladona Ridgel..   3 month ICM trend: 05/16/2023.    12-14 Month ICM trend:      Karie Soda, RN 05/17/2023 3:51 PM

## 2023-06-03 ENCOUNTER — Encounter: Payer: Self-pay | Admitting: Internal Medicine

## 2023-06-03 ENCOUNTER — Ambulatory Visit: Payer: No Typology Code available for payment source | Attending: Internal Medicine | Admitting: Internal Medicine

## 2023-06-03 VITALS — BP 122/68 | HR 79 | Ht 67.0 in | Wt 122.0 lb

## 2023-06-03 DIAGNOSIS — I4819 Other persistent atrial fibrillation: Secondary | ICD-10-CM | POA: Diagnosis not present

## 2023-06-03 LAB — CUP PACEART INCLINIC DEVICE CHECK
Battery Remaining Longevity: 18 mo
Brady Statistic RA Percent Paced: 0 %
Brady Statistic RV Percent Paced: 89 %
Date Time Interrogation Session: 20240913195114
HighPow Impedance: 58.5 Ohm
Implantable Lead Connection Status: 753985
Implantable Lead Connection Status: 753985
Implantable Lead Connection Status: 753985
Implantable Lead Implant Date: 20171215
Implantable Lead Implant Date: 20171215
Implantable Lead Implant Date: 20171215
Implantable Lead Location: 753858
Implantable Lead Location: 753859
Implantable Lead Location: 753860
Implantable Lead Model: 7122
Implantable Pulse Generator Implant Date: 20171215
Lead Channel Impedance Value: 350 Ohm
Lead Channel Impedance Value: 425 Ohm
Lead Channel Impedance Value: 475 Ohm
Lead Channel Pacing Threshold Amplitude: 0.75 V
Lead Channel Pacing Threshold Amplitude: 0.75 V
Lead Channel Pacing Threshold Amplitude: 0.75 V
Lead Channel Pacing Threshold Amplitude: 0.75 V
Lead Channel Pacing Threshold Amplitude: 1 V
Lead Channel Pacing Threshold Amplitude: 1 V
Lead Channel Pacing Threshold Pulse Width: 0.5 ms
Lead Channel Pacing Threshold Pulse Width: 0.5 ms
Lead Channel Pacing Threshold Pulse Width: 0.5 ms
Lead Channel Pacing Threshold Pulse Width: 0.5 ms
Lead Channel Pacing Threshold Pulse Width: 0.5 ms
Lead Channel Pacing Threshold Pulse Width: 0.5 ms
Lead Channel Sensing Intrinsic Amplitude: 11.7 mV
Lead Channel Sensing Intrinsic Amplitude: 3.5 mV
Lead Channel Setting Pacing Amplitude: 2 V
Lead Channel Setting Pacing Amplitude: 2 V
Lead Channel Setting Pacing Amplitude: 2 V
Lead Channel Setting Pacing Pulse Width: 0.5 ms
Lead Channel Setting Pacing Pulse Width: 0.5 ms
Lead Channel Setting Sensing Sensitivity: 0.5 mV
Pulse Gen Serial Number: 7368976

## 2023-06-03 NOTE — Progress Notes (Signed)
HPI [name] returns today for followup. He is a 77 yo man with a h/o PAF, chronic systolic heart failure, s/p ICD insertion, PAD, and COPD. He has become more sedentary. He had developed worsening dyspnea and PFT's and his amiodarone was stopped a couple of years ago. He has developed worsening PAF and PVC's.  He gets tired if he works too long. He has not had any recent ICD therapies. He saw Dr. Shirlee [name] who thought his combo of MR and advanced CHF were behind his class 3 symptoms.  Allergies  Allergen Reactions   Lisinopril     Other reaction(s): Cough     Current Outpatient Medications  Medication Sig Dispense Refill   acetaminophen (TYLENOL) 650 MG CR tablet Take 650 mg by mouth every 8 (eight) hours as needed for pain.     albuterol (VENTOLIN HFA) 108 (90 Base) MCG/ACT inhaler Inhale 1-2 puffs into the lungs every 6 (six) hours as needed (COPD).     Alpha-Lipoic Acid 600 MG TABS Take 600 mg by mouth daily.     apixaban (ELIQUIS) 5 MG TABS tablet Take 1 tablet (5 mg total) by mouth 2 (two) times daily. 60 tablet 5   aspirin EC 81 MG tablet Take 81 mg by mouth daily.     atorvastatin (LIPITOR) 80 MG tablet Take 40 mg by mouth every evening.     bacitracin ointment Apply 1 Application topically 2 (two) times daily as needed for wound care.     carvedilol (COREG) 3.125 MG tablet Take 1 tablet (3.125 mg total) by mouth 2 (two) times daily. 180 tablet 3   cholecalciferol (VITAMIN D) 25 MCG (1000 UNIT) tablet Take 2,000 Units by mouth every evening.     dicloxacillin (DYNAPEN) 250 MG capsule Take 2 capsules (500 mg total) by mouth 3 (three) times daily. (Patient taking differently: Take 500 mg by mouth 2 (two) times daily.) 180 capsule 0   digoxin (LANOXIN) 0.125 MG tablet Take 1 tablet (0.125 mg total) by mouth daily. Monday through Friday only, None on Saturday and Sunday (Patient taking differently: Take 0.125 mg by mouth See admin instructions. Monday through Friday only in the  morning. None on Saturday and Sunday) 20 tablet 5   diphenhydrAMINE (BENADRYL) 25 mg capsule Take 25 mg by mouth every 6 (six) hours as needed for sleep.     empagliflozin (JARDIANCE) 25 MG TABS tablet Take .5mg  Half tablet daily 45 tablet 3   fexofenadine (ALLEGRA) 180 MG tablet Take 180 mg by mouth every evening.     fluticasone (FLONASE) 50 MCG/ACT nasal spray Place 2 sprays into both nostrils daily as needed for allergies or rhinitis.     furosemide (LASIX) 20 MG tablet Take 1 tablet (20 mg total) by mouth daily.     glipiZIDE (GLUCOTROL) 5 MG tablet Take 5 mg by mouth 2 (two) times daily before a meal.     lidocaine (LIDODERM) 5 % Place 1 patch onto the skin daily as needed (For pain).     magnesium oxide (MAG-OX) 400 (241.3 Mg) MG tablet Take 1 tablet (400 mg total) by mouth daily. 30 tablet 6   metFORMIN (GLUCOPHAGE) 1000 MG tablet Take 1 tablet (1,000 mg total) by mouth 2 (two) times daily with a meal. Resume on 09/06/2016     mexiletine (MEXITIL) 150 MG capsule Take 1 capsule (150 mg total) by mouth every 12 (twelve) hours. 60 capsule 5   nitroGLYCERIN (NITROSTAT) 0.4 MG SL tablet  Place 1 tablet (0.4 mg total) under the tongue every 5 (five) minutes x 3 doses as needed for chest pain. 25 tablet 3   Nutritional Supplements (ENSURE PLUS PO) Take 1 Can by mouth 2 (two) times daily.     OVER THE COUNTER MEDICATION Take 1 tablet by mouth See admin instructions. Relaxium every other night     pantoprazole (PROTONIX) 40 MG tablet Take 1 tablet (40 mg total) by mouth daily. 90 tablet 2   spironolactone (ALDACTONE) 25 MG tablet Take 0.5 tablets (12.5 mg total) by mouth daily.     Tiotropium Bromide Monohydrate (SPIRIVA RESPIMAT) 2.5 MCG/ACT AERS Inhale 2 puffs into the lungs at bedtime.     Vitamins A & D (VITAMIN A & D) ointment Apply 1 Application topically as needed for dry skin (on bottoms).     No current facility-administered medications for this visit.     Past Medical History:   Diagnosis Date   Acid reflux    AICD (automatic cardioverter/defibrillator) present    a. 08/2016 St. Jude (serial Number [id number]) biventricular ICD.   Anemia    Chronic systolic CHF (congestive heart failure) (HCC)    a. 08/2016 Echo: EF 20%, diffuse HK, inf, post AK, mod-sev MR, sev dil LA, mod TR, PASP .   COPD (chronic obstructive pulmonary disease) (HCC)    Coronary artery disease involving native coronary artery of native heart with angina pectoris (HCC)    a. 10/2012 MI after aortobifem bypass, found to have multivessel CAD -->CABG x3;  b. 08/2016 Cath: LM 60, LAD 73m, LCX 60ost/p, RCA 100p, VG->RPDA 50p, VG->OM1 ok, LIMA->LAD ok, EF 25%.   Essential hypertension 03/03/2012   History of blood transfusion 11/2012   "during his CABG"   History of gout X 1   History of stomach ulcers 1970s   Hyperlipidemia associated with type 2 diabetes mellitus (HCC) 09/16/2011   Ischemic cardiomyopathy    a. 08/2016 Echo: EF 20%, diffuse HK, inf, post AK;  b. 08/2016 St. Jude (serial Number [id number]) biventricular ICD.   Myocardial infarction [facility])    PAD (peripheral artery disease) (HCC)    s/p aortobifemoral bypass 10/2012   Peripheral neuropathy    PTSD (post-traumatic stress disorder)    Type II diabetes mellitus (HCC)    Ventricular tachycardia (HCC)    a. 08/2016 St. Jude (serial Number [id number]) biventricular ICD-->oral amio added.   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 Is now on chronic suppressive antibiotics with dicloxacillin    ROS:   All systems reviewed and negative except as noted in the HPI.   Past Surgical History:  Procedure Laterality Date   ABDOMINAL AORTOGRAM W/LOWER EXTREMITY Left 05/07/2021   Procedure: ABDOMINAL AORTOGRAM W/LOWER EXTREMITY;  Surgeon: [name], MD;  Location: [facility] INVASIVE CV LAB;  Service: Cardiovascular;   Laterality: Left;   AORTO-FEMORAL BYPASS GRAFT Bilateral 10/2012   [facility]   CARDIAC CATHETERIZATION  04/03/2013   [place] (? for abnormal Nuclear ST) --> no PCI, by report, patent grafts.   CARDIAC CATHETERIZATION N/A 09/02/2016   Procedure: Left Heart Cath and Cors/Grafts Angiography;  Surgeon: Kathleene Hazel, MD;  Location: [facility] INVASIVE CV LAB:: Ost LM 60% -> mid LAD 99% subCTO, ost-prox LCx 60%; prox RCA 100% CTO w/ (small) SVG->RPDA prox 50%; SVG->OM1 ok, LIMA->mLAD ok, EF 25% - complicated by VT during access - Shock x 2,  Lidocaine & Amiodarone   CARDIAC CATHETERIZATION  11/09/2012   [facility]: due to Sustained VT post-Ob Ao-BiFem Bypass. (no Angina)   CHOLECYSTECTOMY  03/05/2012   Procedure: LAPAROSCOPIC CHOLECYSTECTOMY WITH INTRAOPERATIVE CHOLANGIOGRAM;  Surgeon: Emelia Loron, MD;  Location: [facility] OR;  Service: General;  Laterality: N/A;   CORONARY ARTERY BYPASS GRAFT  11/2012   [facility]: CABG X 3 -> LIMA-mLAD, SVG-highOM1, SVG-RPDA (dRCA).   ENDARTERECTOMY FEMORAL Left 05/13/2021   Procedure: Left Femoral Endarterectomy WITH BOVINE PATCH PROFUNDOPLASTY, REVISION OF LEFT LIMB OF AORTO-BIFEMORAL GRAFT WITH DACRON GRAFT;  Surgeon: Cephus Shelling, MD;  Location: [facility] OR;  Service: Vascular;  Laterality: Left;   EP IMPLANTABLE DEVICE N/A 09/03/2016   Procedure: BI-VENTRICULAR IMPLANTABLE CARDIOVERTER DEFIBRILLATOR  (CRT-D);  Surgeon: Marinus Maw, MD;  Location: [facility] INVASIVE CV LAB;  Service: Cardiovascular;  Laterality: N/A;   INGUINAL HERNIA REPAIR Left 1990s   INSERTION OF ILIAC STENT Left 05/13/2021   Procedure: INSERTION OF SUPERFICIAL FEMORAL ARTERY STENT TIMES THREE AND ANGIOPLASTY;  Surgeon: Cephus Shelling, MD;  Location: [facility] OR;  Service: Vascular;  Laterality: Left;   INTRAOPERATIVE ARTERIOGRAM Left 05/13/2021   Procedure: INTRA OPERATIVE ARTERIOGRAM OF LEFT LOWER EXTREMITY;  Surgeon: Cephus Shelling, MD;  Location: [facility] OR;  Service: Vascular;   Laterality: Left;   NM MYOVIEW LTD  10/05/2022   EF ~28%.  Severely reduced function.  High risk due to decreased EF.  Large fixed severe defect in the mid to basal inferior and inferolateral wall with associated wall motion abnormality consistent with prior infarction.  No evidence of reversible defect/ischemia. =? Echo EF 40-45% with mid to apical anterior/inferolateral akinesis and inferior akinesis.   RIGHT/LEFT HEART CATH AND CORONARY/GRAFT ANGIOGRAPHY N/A 03/25/2023   Procedure: RIGHT/LEFT HEART CATH AND CORONARY/GRAFT ANGIOGRAPHY;  Surgeon: Marykay Lex, MD;  Location: [facility] INVASIVE CV LAB;  Service: Cardiovascular;  Laterality: N/A;   STERNOTOMY  09/2013   [facility]): Sternal Wound Infection -> re-do sternotomy with metal place "sheild" placed. -Has chronic staph infection, on chronic suppressive medication   TEE WITHOUT CARDIOVERSION N/A 03/25/2023   Procedure: TRANSESOPHAGEAL ECHOCARDIOGRAM;  Surgeon: Christell Constant, MD;  Location: [facility] INVAS[facility] CV LAB;  Service: Cardiovascular;  Laterality: N/A;   TRANSTHORACIC ECHOCARDIOGRAM  10/05/2022   EF 40 to 45%.  Basal to mid anterolateral and inferolateral akinesis with basal inferior akinesis.  GR 2 DD.  Severely elevated PAP with mild RV dilation.  RV P estimated 67 mmHg).  Severe LA dilation.  Mild to moderate RA dilation.  Mild to moderate TR.  Severe MR likely with restricted posterior leaflet and inferolateral wall motion.  Moderate AOV calcification with no stenosis.  Mild AI.   TRANSTHORACIC ECHOCARDIOGRAM  01/2021   EF 25 to 30%.  Severely depressed EF.  Global HK with worst basal to mid anterolateral and inferolateral as well as anterior basal inferior walls.  GR 1 DD.  Mildly reduced RV function.  Functionally bicuspid aortic valve with moderate calcification but no stenosis.  Overall slightly improved EF.   UMBILICAL HERNIA REPAIR  03/05/2012   Procedure: HERNIA REPAIR UMBILICAL ADULT;  Surgeon: Emelia Loron, MD;  Location: [facility]  OR;  Service: General;  Laterality: N/A;     Family History  Problem Relation Age of Onset   Cancer Brother        esophageal     Social History   Socioeconomic History   Marital status: Married    Spouse name: Not on file   Number  of children: Not on file   Years of education: Not on file   Highest education level: Not on file  Occupational History   Not on file  Tobacco Use   Smoking status: Every Day    Types: Pipe    Passive exposure: Never   Smokeless tobacco: Former    Types: Snuff, Chew   Tobacco comments:    09/03/2016 "used smokeless tobacco in my teens; quit smoking cigarettes in the early 1980s; smokes pipe only"  Vaping Use   Vaping status: Never Used  Substance and Sexual Activity   Alcohol use: No   Drug use: No   Sexual activity: Not on file  Other Topics Concern   Not on file  Social History Narrative   Routine activity around the house, but is limited more by musculoskeletal leg pain and weakness.      He enjoys smoking a pipe-long ago, he quit cigarettes   Social Determinants of Health   Financial Resource Strain: Not on file  Food Insecurity: No Food Insecurity (02/18/2023)   Hunger Vital Sign    Worried About Running Out of Food in the Last Year: Never true    Ran Out of Food in the Last Year: Never true  Transportation Needs: No Transportation Needs (02/18/2023)   PRAPARE - Administrator, Civil Service (Medical): No    Lack of Transportation (Non-Medical): No  Physical Activity: Not on file  Stress: Not on file  Social Connections: Not on file  Intimate Partner Violence: Not At Risk (02/18/2023)   Humiliation, Afraid, Rape, and Kick questionnaire    Fear of Current or Ex-Partner: No    Emotionally Abused: No    Physically Abused: No    Sexually Abused: No     BP 122/68   Pulse 79   Ht 5\' 7"  (1.702 m)   Wt 122 lb (55.3 kg)   SpO2 98%   BMI 19.11 kg/m   Physical Exam:  Well appearing NAD HEENT: Unremarkable Neck:   No JVD, no thyromegally Lymphatics:  No adenopathy Back:  No CVA tenderness Lungs:  Clear with no wheezes HEART:  Regular rate rhythm, no murmurs, no rubs, no clicks Abd:  soft, positive bowel sounds, no organomegally, no rebound, no guarding Ext:  2 plus pulses, no edema, no cyanosis, no clubbing Skin:  No rashes no nodules Neuro:  CN II through XII intact, motor grossly intact  DEVICE  Normal device function.  See PaceArt for details. ERI at 1.5 years  Assess/Plan:  Persistent atrial fib - He remains in NSR. Continue current meds. PVC's - he does not have palpitations. He has about 11%.  ICD - his St. Jude biv ICD is working normally. Chronic systolic heart failure - his symptoms are class 3. He will continue his current meds. MR - he is pending followup with Dr. Excell Seltzer to discuss clipping of the mitral valve.   [name] Gowda Chazz Philson,MD

## 2023-06-03 NOTE — Patient Instructions (Signed)

## 2023-06-10 NOTE — Progress Notes (Signed)
PCP: [facility] Cardiology: Dr. Herbie Baltimore HF Cardiology: Dr. Shirlee Latch  77 y.o. with history of CAD s/p CABG, ischemic cardiomyopathy, PAD s/p aortobifemoral bypass, PAF, and severe mitral regurgitation was referred by Dr. Excell Seltzer for CHF evaluation prior to mTEER.  Patient had MI in 2/14 with cath showing severe 3VD.  CABG in 2/14 with LIMA-LAD, SVG-PDA, SVG-OM1. Subsequently developed ischemic cardiomyopathy.  He had St Jude CRT-D device placed.  Echo in 1/24 showed EF 40-45%, basal-mid AL and IL AK, basal inferior AK, mild LVH, moderate RV dysfunction with PASP 67 mmHG, severe LAE, severe infarct-related MR, mild-moderate TR.  He also has history of aortobifemoral bypass in 2014.  He had left SFA stent in 2022 for tissue loss in the left great toe. 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  Had breakthrough atrial fibrillation on sotalol with inappropriate ICD shock and is now off sotalol. He has been on mexiletine for frequent PVCs.   Patient's mitral regurgitation has been evaluated recently by Dr. Excell Seltzer for possible mTEER.  Cath in 7/24 showed elevated filling pressures and low cardiac output.  Bypass grafts were patent. TEE in 7/24 showed EF 30-35%, severe MR with restricted posterior leaflet and small flail segment in posterior leaflet, RV normal, mild AI.   Patient was started on spironolactone, developed mood changes (episodes of "rage") around that time so family had him stop it.  Mood is back to normal.   Patient returns for followup of CHF. Patient is frail-appearing, comes to the office in a wheelchair. He says he gets around the house with a walker and has a motorized scooter for the yard.  Legs continue to be weak, gives out easily.  Per family, he is sleeping a lot during the day because he is urinating a lot at night though taking Lasix in the morning.  Appetite is better, drinking Ensure.  Weight is down 5 lbs.  No lightheadedness,  no palpitations.  He is short of breath walking longer distances with his walker but more limited by fatigue.  No chest pain.   ECG (personally reviewed): NSR, BiV paced with PVCs  Labs (6/24): hgb 13.9, digoxin level < 0.4, K 4.7, creatinine 0.75 Labs (7/24): K 4.5, creatinine 0.89  St Jude device interrogation: stable thoracic impedance, BiV pacing 91% of the time, no AF/VT   PMH: 1. CAD: s/p CABG.  MI 2/14, severe 3VD on cath.  CABG with LIMA-LAD, SVG-PDA, SVG-OM1.   - LHC (7/24): occluded RCA, patent SVG-RCA, 75% dLM/ostial LCx, occluded mLAD, patent SVG-OM, patent LIMA-LAD.  2. H/o VT 3. Chronic systolic CHF: Ischemic cardiomyopathy. St Jude CRT-D device.  - Echo (12/17): EF 20% - Echo (1/24): EF 40-45%, basal-mid AL and IL AK, basal inferior AK, mild LVH, moderate RV dysfunction with PASP 67 mmHG, severe LAE, severe infarct-related MR, mild-moderate TR.  - RHC (7/24): mean RA 10, PA 63/35 mean 46, mean PCWP 27 with v waves to 35, CI 1.5 F.  - TEE (7/24): EF 30-35%, severe MR with restricted posterior leaflet and small flail segment in posterior leaflet, RV normal, mild AI.  4. PAD: S/p aortobifemoral bypass in 2014, later with left SFA stent in 2022. Followed by Dr. Chestine Spore.  5. Atrial fibrillation: Paroxysmal. Failed amiodarone due to lung toxicity.  Failed sotalol.  H/o inappropriate ICD shocks.  6. HTN 7. Gout  8. 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  Social History   Socioeconomic History   Marital status: Married  Spouse name: Not on file   Number of children: Not on file   Years of education: Not on file   Highest education level: Not on file  Occupational History   Not on file  Tobacco Use   Smoking status: Every Day    Types: Pipe    Passive exposure: Never   Smokeless tobacco: Former    Types: Snuff, Chew   Tobacco comments:    09/03/2016 "used smokeless tobacco in my teens; quit smoking cigarettes in the early 1980s; smokes pipe only"  Vaping Use   Vaping  status: Never Used  Substance and Sexual Activity   Alcohol use: No   Drug use: No   Sexual activity: Not on file  Other Topics Concern   Not on file  Social History Narrative   Routine activity around the house, but is limited more by musculoskeletal leg pain and weakness.      He enjoys smoking a pipe-long ago, he quit cigarettes   Social Determinants of Health   Financial Resource Strain: Not on file  Food Insecurity: No Food Insecurity (02/18/2023)   Hunger Vital Sign    Worried About Running Out of Food in the Last Year: Never true    Ran Out of Food in the Last Year: Never true  Transportation Needs: No Transportation Needs (02/18/2023)   PRAPARE - Administrator, Civil Service (Medical): No    Lack of Transportation (Non-Medical): No  Physical Activity: Not on file  Stress: Not on file  Social Connections: Not on file  Intimate Partner Violence: Not At Risk (02/18/2023)   Humiliation, Afraid, Rape, and Kick questionnaire    Fear of Current or Ex-Partner: No    Emotionally Abused: No    Physically Abused: No    Sexually Abused: No   Family History  Problem Relation Age of Onset   Cancer Brother        esophageal   ROS: All systems reviewed and negative except as per HPI.   Current Outpatient Medications  Medication Sig Dispense Refill   acetaminophen (TYLENOL) 650 MG CR tablet Take 650 mg by mouth every 8 (eight) hours as needed for pain.     albuterol (VENTOLIN HFA) 108 (90 Base) MCG/ACT inhaler Inhale 1-2 puffs into the lungs every 6 (six) hours as needed (COPD).     Alpha-Lipoic Acid 600 MG TABS Take 600 mg by mouth daily.     apixaban (ELIQUIS) 5 MG TABS tablet Take 1 tablet (5 mg total) by mouth 2 (two) times daily. 60 tablet 5   aspirin EC 81 MG tablet Take 81 mg by mouth daily.     atorvastatin (LIPITOR) 80 MG tablet Take 40 mg by mouth every evening.     bacitracin ointment Apply 1 Application topically 2 (two) times daily as needed for wound  care.     carvedilol (COREG) 3.125 MG tablet Take 1 tablet (3.125 mg total) by mouth 2 (two) times daily. 180 tablet 3   cholecalciferol (VITAMIN D) 25 MCG (1000 UNIT) tablet Take 2,000 Units by mouth every evening.     dicloxacillin (DYNAPEN) 250 MG capsule Take 2 capsules (500 mg total) by mouth 3 (three) times daily. (Patient taking differently: Take 500 mg by mouth 2 (two) times daily.) 180 capsule 0   digoxin (LANOXIN) 0.125 MG tablet Take 1 tablet (0.125 mg total) by mouth daily. Monday through Friday only, None on Saturday and Sunday (Patient taking differently: Take 0.125 mg by mouth See  admin instructions. Monday through Friday only in the morning. None on Saturday and Sunday) 20 tablet 5   diphenhydrAMINE (BENADRYL) 25 mg capsule Take 25 mg by mouth every 6 (six) hours as needed for sleep.     empagliflozin (JARDIANCE) 25 MG TABS tablet Take .5mg  Half tablet daily 45 tablet 3   fexofenadine (ALLEGRA) 180 MG tablet Take 180 mg by mouth every evening.     fluticasone (FLONASE) 50 MCG/ACT nasal spray Place 2 sprays into both nostrils daily as needed for allergies or rhinitis.     furosemide (LASIX) 20 MG tablet Take 1 tablet (20 mg total) by mouth daily.     glipiZIDE (GLUCOTROL) 5 MG tablet Take 5 mg by mouth 2 (two) times daily before a meal.     lidocaine (LIDODERM) 5 % Place 1 patch onto the skin daily as needed (For pain).     magnesium oxide (MAG-OX) 400 (241.3 Mg) MG tablet Take 1 tablet (400 mg total) by mouth daily. 30 tablet 6   metFORMIN (GLUCOPHAGE) 1000 MG tablet Take 1 tablet (1,000 mg total) by mouth 2 (two) times daily with a meal. Resume on 09/06/2016     mexiletine (MEXITIL) 150 MG capsule Take 1 capsule (150 mg total) by mouth every 12 (twelve) hours. 60 capsule 5   nitroGLYCERIN (NITROSTAT) 0.4 MG SL tablet Place 1 tablet (0.4 mg total) under the tongue every 5 (five) minutes x 3 doses as needed for chest pain. 25 tablet 3   Nutritional Supplements (ENSURE PLUS PO) Take 1  Can by mouth 2 (two) times daily.     OVER THE COUNTER MEDICATION Take 1 tablet by mouth See admin instructions. Relaxium every other night     pantoprazole (PROTONIX) 40 MG tablet Take 1 tablet (40 mg total) by mouth daily. 90 tablet 2   spironolactone (ALDACTONE) 25 MG tablet Take 0.5 tablets (12.5 mg total) by mouth daily.     Tiotropium Bromide Monohydrate (SPIRIVA RESPIMAT) 2.5 MCG/ACT AERS Inhale 2 puffs into the lungs at bedtime.     Vitamins A & D (VITAMIN A & D) ointment Apply 1 Application topically as needed for dry skin (on bottoms).     No current facility-administered medications for this visit.   There were no vitals taken for this visit. General: NAD, frail Neck: No JVD, no thyromegaly or thyroid nodule.  Lungs: Clear to auscultation bilaterally with normal respiratory effort. CV: Nondisplaced PMI.  Heart regular S1/S2, no S3/S4, no murmur.  No peripheral edema.  No carotid bruit.  Normal pedal pulses.  Abdomen: Soft, nontender, no hepatosplenomegaly, no distention.  Skin: Intact without lesions or rashes.  Neurologic: Alert and oriented x 3.  Psych: Normal affect. Extremities: No clubbing or cyanosis.  HEENT: Normal.   Assessment/Plan: 1. CAD: s/p CABG in 2014. Cath in 7/24 showed patent grafts.  No chest pain.  - On ASA 81 - Continue atorvastatin.  2. Chronic systolic CHF: Ischemic cardiomyopathy.  St Jude CRT-D device.  Last study was a TEE in 7/24 showing EF 30-35%, severe MR with restricted posterior leaflet and small flail segment in posterior leaflet, RV normal, mild AI. Recent RHC showing elevated filling pressures and low cardiac output.  Patient is quite frail, NYHA class III symptoms.  Limited to activity around the house.  I suspect he is limited by advanced heart failure/volume overload worsened by ischemic mitral regurgitation as well as significant deconditioning. On exam and by [place] today, he is not volume overloaded.  - Continue  Coreg 3.125 mg bid.  -  Continue digoxin, check level.  - Cut Lasix back to 20 mg daily, patient feels like he is urinating excessively and is having trouble sleeping at night.  - Continue Farxiga 10 mg daily.  - I will try to restart him on spironolactone 12.5 mg daily, I am not sure that spironolactone actually caused his mood problems.  However, if episodes of "rage" re-occur after restarting spironolactone, will stop it. BMET/BNP today, BMET 10 days.  - Mitral regurgitation is severe, hopefully mTEER will help him symptomatically.  3. Mitral regurgitation: TEE (7/24) with severe MR with restricted posterior leaflet and small flail segment in posterior leaflet.  Suspect primarily infarct-related MR.  I suspect MR is playing a significant role in his symptomatology.  - Not surgical candidate.  Volume status is better.  Though he is frail, would recommend proceeding with mTEER.  4. Atrial fibrillation: Paroxysmal. Has failed amiodarone (lung toxicity) as well as sotalol (break through). NSR today.  - Continue apixaban 5 mg bid.  5. PVCs: Frequent.  Continue mexiletine.  6. PAD: S/p aortobifemoral bypass 2014 then SFA stent 2022.  Followed by Dr. Chestine Spore.  Had left foot ulcerations that have healed.   Followup in 6 wks with APP.   [name] [name] [facility] 06/10/2023

## 2023-06-13 ENCOUNTER — Encounter (HOSPITAL_COMMUNITY): Payer: Self-pay

## 2023-06-13 ENCOUNTER — Ambulatory Visit (HOSPITAL_COMMUNITY)
Admission: RE | Admit: 2023-06-13 | Discharge: 2023-06-13 | Disposition: A | Discharge status: Home / Self Care | Payer: No Typology Code available for payment source | Source: Ambulatory Visit | Attending: Family Medicine | Admitting: Family Medicine

## 2023-06-13 VITALS — BP 144/84 | HR 79 | Wt 121.0 lb

## 2023-06-13 DIAGNOSIS — I25119 Atherosclerotic heart disease of native coronary artery with unspecified angina pectoris: Secondary | ICD-10-CM

## 2023-06-13 DIAGNOSIS — L97529 Non-pressure chronic ulcer of other part of left foot with unspecified severity: Secondary | ICD-10-CM | POA: Insufficient documentation

## 2023-06-13 DIAGNOSIS — F17200 Nicotine dependence, unspecified, uncomplicated: Secondary | ICD-10-CM | POA: Diagnosis not present

## 2023-06-13 DIAGNOSIS — I255 Ischemic cardiomyopathy: Secondary | ICD-10-CM | POA: Insufficient documentation

## 2023-06-13 DIAGNOSIS — E119 Type 2 diabetes mellitus without complications: Secondary | ICD-10-CM | POA: Diagnosis not present

## 2023-06-13 DIAGNOSIS — I5022 Chronic systolic (congestive) heart failure: Secondary | ICD-10-CM | POA: Diagnosis not present

## 2023-06-13 DIAGNOSIS — E785 Hyperlipidemia, unspecified: Secondary | ICD-10-CM | POA: Diagnosis not present

## 2023-06-13 DIAGNOSIS — Z7984 Long term (current) use of oral hypoglycemic drugs: Secondary | ICD-10-CM | POA: Insufficient documentation

## 2023-06-13 DIAGNOSIS — Z7982 Long term (current) use of aspirin: Secondary | ICD-10-CM | POA: Diagnosis not present

## 2023-06-13 DIAGNOSIS — I739 Peripheral vascular disease, unspecified: Secondary | ICD-10-CM

## 2023-06-13 DIAGNOSIS — I493 Ventricular premature depolarization: Secondary | ICD-10-CM

## 2023-06-13 DIAGNOSIS — I2510 Atherosclerotic heart disease of native coronary artery without angina pectoris: Secondary | ICD-10-CM | POA: Diagnosis present

## 2023-06-13 DIAGNOSIS — I4819 Other persistent atrial fibrillation: Secondary | ICD-10-CM | POA: Diagnosis not present

## 2023-06-13 DIAGNOSIS — I480 Paroxysmal atrial fibrillation: Secondary | ICD-10-CM | POA: Insufficient documentation

## 2023-06-13 DIAGNOSIS — Z951 Presence of aortocoronary bypass graft: Secondary | ICD-10-CM | POA: Diagnosis not present

## 2023-06-13 DIAGNOSIS — R54 Age-related physical debility: Secondary | ICD-10-CM | POA: Insufficient documentation

## 2023-06-13 DIAGNOSIS — I110 Hypertensive heart disease with heart failure: Secondary | ICD-10-CM | POA: Diagnosis not present

## 2023-06-13 DIAGNOSIS — E11621 Type 2 diabetes mellitus with foot ulcer: Secondary | ICD-10-CM | POA: Insufficient documentation

## 2023-06-13 DIAGNOSIS — I252 Old myocardial infarction: Secondary | ICD-10-CM | POA: Diagnosis not present

## 2023-06-13 DIAGNOSIS — Z7901 Long term (current) use of anticoagulants: Secondary | ICD-10-CM | POA: Insufficient documentation

## 2023-06-13 DIAGNOSIS — E1151 Type 2 diabetes mellitus with diabetic peripheral angiopathy without gangrene: Secondary | ICD-10-CM | POA: Diagnosis not present

## 2023-06-13 DIAGNOSIS — I340 Nonrheumatic mitral (valve) insufficiency: Secondary | ICD-10-CM | POA: Diagnosis not present

## 2023-06-13 DIAGNOSIS — Z79899 Other long term (current) drug therapy: Secondary | ICD-10-CM | POA: Diagnosis not present

## 2023-06-13 LAB — HEMOGLOBIN A1C
Hgb A1c MFr Bld: 9.6 % — ABNORMAL HIGH (ref 4.8–5.6)
Mean Plasma Glucose: 228.82 mg/dL

## 2023-06-13 LAB — BRAIN NATRIURETIC PEPTIDE: B Natriuretic Peptide: 561 pg/mL — ABNORMAL HIGH (ref 0.0–100.0)

## 2023-06-13 LAB — BASIC METABOLIC PANEL
Anion gap: 8 (ref 5–15)
BUN: 15 mg/dL (ref 8–23)
CO2: 28 mmol/L (ref 22–32)
Calcium: 9 mg/dL (ref 8.9–10.3)
Chloride: 96 mmol/L — ABNORMAL LOW (ref 98–111)
Creatinine, Ser: 0.87 mg/dL (ref 0.61–1.24)
GFR, Estimated: >60 mL/min (ref >60)
Glucose, Bld: 150 mg/dL — ABNORMAL HIGH (ref 70–99)
Potassium: 4.4 mmol/L (ref 3.5–5.1)
Sodium: 132 mmol/L — ABNORMAL LOW (ref 135–145)

## 2023-06-13 MED ORDER — DIGOXIN 125 MCG PO TABS: 0.1250 mg | ORAL_TABLET | Freq: Every day | ORAL | 3 refills | Status: DC

## 2023-06-13 NOTE — Patient Instructions (Signed)
Medication Changes:  No Changes In Medications at this time.   Lab Work:  Labs done today, your results will be available in MyChart, we will contact you for abnormal readings.  Follow-Up in: 3 months with Dr. Shirlee Latch as scheduled   At the [facility], you and your health needs are our priority. We have a designated team specialized in the treatment of Heart Failure. This Care Team includes your primary Heart Failure Specialized Cardiologist (physician), Advanced Practice Providers (APPs- Physician Assistants and Nurse Practitioners), and Pharmacist who all work together to provide you with the care you need, when you need it.   You may see any of the following providers on your designated Care Team at your next follow up:  Dr. Arvilla Meres Dr. Marca Ancona Dr. Marcos Eke, NP Robbie Lis, [place] [facility] [place], [place] Brynda Peon, NP Karle Plumber, PharmD   Please be sure to bring in all your medications bottles to every appointment.   Need to Contact us:  If you have any questions or concerns before your next appointment please send [place] a message through [place] or call our office at [phone].    TO LEAVE A MESSAGE FOR THE NURSE SELECT OPTION 2, PLEASE LEAVE A MESSAGE INCLUDING: YOUR NAME DATE OF BIRTH CALL BACK NUMBER REASON FOR CALL**this is important as we prioritize the call backs  YOU WILL RECEIVE A CALL BACK THE SAME DAY AS LONG AS YOU CALL BEFORE 4:00 PM

## 2023-06-14 ENCOUNTER — Encounter (HOSPITAL_COMMUNITY): Payer: No Typology Code available for payment source

## 2023-06-14 ENCOUNTER — Ambulatory Visit (INDEPENDENT_AMBULATORY_CARE_PROVIDER_SITE_OTHER): Payer: Medicare Other | Admitting: Physician Assistant

## 2023-06-14 ENCOUNTER — Ambulatory Visit (INDEPENDENT_AMBULATORY_CARE_PROVIDER_SITE_OTHER)
Admission: RE | Admit: 2023-06-14 | Discharge: 2023-06-14 | Disposition: A | Discharge status: Home / Self Care | Payer: No Typology Code available for payment source | Source: Ambulatory Visit | Attending: Family Medicine | Admitting: Family Medicine

## 2023-06-14 VITALS — BP 120/78 | HR 74 | Temp 97.6°F | Ht 66.0 in | Wt 121.4 lb

## 2023-06-14 DIAGNOSIS — L97929 Non-pressure chronic ulcer of unspecified part of left lower leg with unspecified severity: Secondary | ICD-10-CM

## 2023-06-14 DIAGNOSIS — I110 Hypertensive heart disease with heart failure: Secondary | ICD-10-CM | POA: Diagnosis not present

## 2023-06-14 DIAGNOSIS — L97919 Non-pressure chronic ulcer of unspecified part of right lower leg with unspecified severity: Secondary | ICD-10-CM | POA: Diagnosis not present

## 2023-06-14 DIAGNOSIS — I739 Peripheral vascular disease, unspecified: Secondary | ICD-10-CM

## 2023-06-14 DIAGNOSIS — I83029 Varicose veins of left lower extremity with ulcer of unspecified site: Secondary | ICD-10-CM | POA: Diagnosis not present

## 2023-06-14 DIAGNOSIS — I83019 Varicose veins of right lower extremity with ulcer of unspecified site: Secondary | ICD-10-CM | POA: Diagnosis not present

## 2023-06-14 LAB — VAS US ABI WITH/WO TBI
Left ABI: 0.93
Right ABI: 0.74

## 2023-06-14 NOTE — Progress Notes (Unsigned)
[facility] [place] HISTORY AND PHYSICAL   History of Present Illness:  Patient is a 77 y.o. year old male who presents for evaluation of claudication.  The patient currently describes a cramping sensation in the {Desc; right/left/both:390000001} lower extremity. There {Is/is not:9024} rest pain.  There is no history of ulcerations on the feet.  Atherosclerotic risk factors and other medical problems include ***  Past Medical History:  Diagnosis Date   Acid reflux    AICD (automatic cardioverter/defibrillator) present    a. 08/2016 St. Jude (serial Number [id number]) biventricular ICD.   Anemia    Chronic systolic CHF (congestive heart failure) (HCC)    a. 08/2016 Echo: EF 20%, diffuse HK, inf, post AK, mod-sev MR, sev dil LA, mod TR, PASP .   COPD (chronic obstructive pulmonary disease) (HCC)    Coronary artery disease involving native coronary artery of native heart with angina pectoris (HCC)    a. 10/2012 MI after aortobifem bypass, found to have multivessel CAD -->CABG x3;  b. 08/2016 Cath: LM 60, LAD 41m, LCX 60ost/p, RCA 100p, VG->RPDA 50p, VG->OM1 ok, LIMA->LAD ok, EF 25%.   Essential hypertension 03/03/2012   History of blood transfusion 11/2012   "during his CABG"   History of gout X 1   History of stomach ulcers 1970s   Hyperlipidemia associated with type 2 diabetes mellitus (HCC) 09/16/2011   Ischemic cardiomyopathy    a. 08/2016 Echo: EF 20%, diffuse HK, inf, post AK;  b. 08/2016 St. Jude (serial Number [id number]) biventricular ICD.   Myocardial infarction [facility])    PAD (peripheral artery disease) (HCC)    s/p aortobifemoral bypass 10/2012   Peripheral neuropathy    PTSD (post-traumatic stress disorder)    Type II diabetes mellitus (HCC)    Ventricular tachycardia (HCC)    a. 08/2016 St. Jude (serial Number [id number]) biventricular ICD-->oral amio added.   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 Is now on chronic suppressive antibiotics with dicloxacillin    Past Surgical History:  Procedure Laterality Date   ABDOMINAL AORTOGRAM W/LOWER EXTREMITY Left 05/07/2021   Procedure: ABDOMINAL AORTOGRAM W/LOWER EXTREMITY;  Surgeon: [name], MD;  Location: [facility] INVASIVE CV LAB;  Service: Cardiovascular;  Laterality: Left;   AORTO-FEMORAL BYPASS GRAFT Bilateral 10/2012   [facility]   CARDIAC CATHETERIZATION  04/03/2013   [place] (? for abnormal Nuclear ST) --> no PCI, by report, patent grafts.   CARDIAC CATHETERIZATION N/A 09/02/2016   Procedure: Left Heart Cath and Cors/Grafts Angiography;  Surgeon: Kathleene Hazel, MD;  Location: [facility] INVASIVE CV LAB:: Ost LM 60% -> mid LAD 99% subCTO, ost-prox LCx 60%; prox RCA 100% CTO w/ (small) SVG->RPDA prox 50%; SVG->OM1 ok, LIMA->mLAD ok, EF 25% - complicated by VT during access - Shock x 2, Lidocaine & Amiodarone   CARDIAC CATHETERIZATION  11/09/2012   [facility]: due to Sustained VT post-Ob Ao-BiFem Bypass. (no Angina)   CHOLECYSTECTOMY  03/05/2012   Procedure: LAPAROSCOPIC CHOLECYSTECTOMY WITH INTRAOPERATIVE CHOLANGIOGRAM;  Surgeon: Emelia Loron, MD;  Location: [facility] OR;  Service: General;  Laterality: N/A;   CORONARY ARTERY BYPASS GRAFT  11/2012   [facility]: CABG X 3 -> LIMA-mLAD, SVG-highOM1, SVG-RPDA (dRCA).   ENDARTERECTOMY FEMORAL Left 05/13/2021   Procedure: Left Femoral Endarterectomy WITH BOVINE PATCH PROFUNDOPLASTY, REVISION OF LEFT LIMB OF AORTO-BIFEMORAL GRAFT WITH DACRON GRAFT;  Surgeon: Cephus Shelling, MD;  Location: [facility] OR;  Service: Vascular;  Laterality: Left;   EP IMPLANTABLE DEVICE N/A 09/03/2016   Procedure: BI-VENTRICULAR IMPLANTABLE CARDIOVERTER DEFIBRILLATOR  (CRT-D);  Surgeon: Marinus Maw, MD;  Location: [facility] INVASIVE CV LAB;  Service: Cardiovascular;  Laterality: N/A;   INGUINAL HERNIA REPAIR Left 1990s   INSERTION OF ILIAC STENT Left  05/13/2021   Procedure: INSERTION OF SUPERFICIAL FEMORAL ARTERY STENT TIMES THREE AND ANGIOPLASTY;  Surgeon: Cephus Shelling, MD;  Location: [facility] OR;  Service: Vascular;  Laterality: Left;   INTRAOPERATIVE ARTERIOGRAM Left 05/13/2021   Procedure: INTRA OPERATIVE ARTERIOGRAM OF LEFT LOWER EXTREMITY;  Surgeon: Cephus Shelling, MD;  Location: [facility] OR;  Service: Vascular;  Laterality: Left;   NM MYOVIEW LTD  10/05/2022   EF ~28%.  Severely reduced function.  High risk due to decreased EF.  Large fixed severe defect in the mid to basal inferior and inferolateral wall with associated wall motion abnormality consistent with prior infarction.  No evidence of reversible defect/ischemia. =? Echo EF 40-45% with mid to apical anterior/inferolateral akinesis and inferior akinesis.   RIGHT/LEFT HEART CATH AND CORONARY/GRAFT ANGIOGRAPHY N/A 03/25/2023   Procedure: RIGHT/LEFT HEART CATH AND CORONARY/GRAFT ANGIOGRAPHY;  Surgeon: Marykay Lex, MD;  Location: [facility] INVASIVE CV LAB;  Service: Cardiovascular;  Laterality: N/A;   STERNOTOMY  09/2013   [facility]): Sternal Wound Infection -> re-do sternotomy with metal place "sheild" placed. -Has chronic staph infection, on chronic suppressive medication   TEE WITHOUT CARDIOVERSION N/A 03/25/2023   Procedure: TRANSESOPHAGEAL ECHOCARDIOGRAM;  Surgeon: Christell Constant, MD;  [facility] INVAS[facility] LAB;  Service: Cardiovascular;  Laterality: N/A;   TRANSTHORACIC ECHOCARDIOGRAM  10/05/2022   EF 40 to 45%.  Basal to mid anterolateral and inferolateral akinesis with basal inferior akinesis.  GR 2 DD.  Severely elevated PAP with mild RV dilation.  RV P estimated 67 mmHg).  Severe LA dilation.  Mild to moderate RA dilation.  Mild to moderate TR.  Severe MR likely with restricted posterior leaflet and inferolateral wall motion.  Moderate AOV calcification with no stenosis.  Mild AI.   TRANSTHORACIC ECHOCARDIOGRAM  01/2021   EF 25 to 30%.  Severely depressed EF.  Global  HK with worst basal to mid anterolateral and inferolateral as well as anterior basal inferior walls.  GR 1 DD.  Mildly reduced RV function.  Functionally bicuspid aortic valve with moderate calcification but no stenosis.  Overall slightly improved EF.   UMBILICAL HERNIA REPAIR  03/05/2012   Procedure: HERNIA REPAIR UMBILICAL ADULT;  Surgeon: Emelia Loron, MD;  Location: [facility] OR;  Service: General;  Laterality: N/A;    ROS:   General:  No weight loss, Fever, chills  HEENT: No recent headaches, no nasal bleeding, no visual changes, no sore throat  Neurologic: No dizziness, blackouts, seizures. No recent symptoms of stroke or mini- stroke. No recent episodes of slurred speech, or temporary blindness.  Cardiac: No recent episodes of chest pain/pressure, no shortness of breath at rest.  No shortness of breath with exertion.  Denies history of atrial fibrillation or irregular heartbeat  Vascular: No history of rest pain in feet.  No history of claudication.  No history of non-healing ulcer, No history of DVT   Pulmonary: No home oxygen, no productive cough, no hemoptysis,  No asthma or wheezing  Musculoskeletal:  [ ]  Arthritis, [ ]  Low back pain,  [ ]  Joint pain  Hematologic:No history of hypercoagulable state.  No history of easy bleeding.  No history of anemia  Gastrointestinal: No hematochezia or melena,  No gastroesophageal reflux, no trouble swallowing  Urinary: [ ]  chronic Kidney disease, [ ]  on HD - [ ]  MWF or [ ]  TTHS, [ ]  Burning with urination, [ ]  Frequent urination, [ ]  Difficulty urinating;   Skin: No rashes  Psychological: No history of anxiety,  No history of depression  Social History Social History   Tobacco Use   Smoking status: Every Day    Types: Pipe    Passive exposure: Never   Smokeless tobacco: Former    Types: Snuff, Chew   Tobacco comments:    09/03/2016 "used smokeless tobacco in my teens; quit smoking cigarettes in the early 1980s; smokes pipe only"   Vaping Use   Vaping status: Never Used  Substance Use Topics   Alcohol use: No   Drug use: No    Family History Family History  Problem Relation Age of Onset   Cancer Brother        esophageal    Allergies  Allergies  Allergen Reactions   Lisinopril     Other reaction(s): Cough     Current Outpatient Medications  Medication Sig Dispense Refill   acetaminophen (TYLENOL) 650 MG CR tablet Take 650 mg by mouth every 8 (eight) hours as needed for pain.     albuterol (VENTOLIN HFA) 108 (90 Base) MCG/ACT inhaler Inhale 1-2 puffs into the lungs every 6 (six) hours as needed (COPD).     Alpha-Lipoic Acid 600 MG TABS Take 600 mg by mouth daily.     apixaban (ELIQUIS) 5 MG TABS tablet Take 1 tablet (5 mg total) by mouth 2 (two) times daily. 60 tablet 5   aspirin EC 81 MG tablet Take 81 mg by mouth daily.     atorvastatin (LIPITOR) 80 MG tablet Take 40 mg by mouth every evening.     bacitracin ointment Apply 1 Application topically 2 (two) times daily as needed for wound care.     carvedilol (COREG) 3.125 MG tablet Take 1 tablet (3.125 mg total) by mouth 2 (two) times daily. 180 tablet 3   cholecalciferol (VITAMIN D) 25 MCG (1000 UNIT) tablet Take 2,000 Units by mouth every evening.     dicloxacillin (DYNAPEN) 250 MG capsule Take 2 capsules (500 mg total) by mouth 3 (three) times daily. (Patient taking differently: Take 500 mg by mouth 2 (two) times daily.) 180 capsule 0   digoxin (LANOXIN) 0.125 MG tablet Take 1 tablet (0.125 mg total) by mouth daily. Monday through Friday only, None on Saturday and Sunday 60 tablet 3   diphenhydrAMINE (BENADRYL) 25 mg capsule Take 25 mg by mouth every 6 (six) hours as needed for sleep.     empagliflozin (JARDIANCE) 25 MG TABS tablet Take .5mg  Half tablet daily (Patient taking differently: Take 0.5mg  Half tablet daily) 45 tablet 3   fexofenadine (ALLEGRA) 180 MG tablet Take 180 mg by mouth every evening.     fluticasone (FLONASE) 50 MCG/ACT nasal spray  Place 2 sprays into both nostrils daily as needed for allergies or rhinitis.     furosemide (LASIX) 20 MG tablet Take 1 tablet (20 mg total) by mouth daily.     glipiZIDE (GLUCOTROL) 5 MG tablet Take 5 mg by mouth 2 (two) times daily before a meal.     lidocaine (LIDODERM) 5 % Place 1 patch onto the skin daily as needed (For pain).     magnesium oxide (MAG-OX) 400 (241.3 Mg) MG tablet Take 1 tablet (400 mg total)  by mouth daily. 30 tablet 6   metFORMIN (GLUCOPHAGE) 1000 MG tablet Take 1 tablet (1,000 mg total) by mouth 2 (two) times daily with a meal. Resume on 09/06/2016     mexiletine (MEXITIL) 150 MG capsule Take 1 capsule (150 mg total) by mouth every 12 (twelve) hours. 60 capsule 5   nitroGLYCERIN (NITROSTAT) 0.4 MG SL tablet Place 1 tablet (0.4 mg total) under the tongue every 5 (five) minutes x 3 doses as needed for chest pain. 25 tablet 3   Nutritional Supplements (ENSURE PLUS PO) Take 1 Can by mouth 2 (two) times daily.     OVER THE COUNTER MEDICATION Take 1 tablet by mouth See admin instructions. Relaxium every other night as needed     pantoprazole (PROTONIX) 40 MG tablet Take 1 tablet (40 mg total) by mouth daily. 90 tablet 2   spironolactone (ALDACTONE) 25 MG tablet Take 0.5 tablets (12.5 mg total) by mouth daily.     Tiotropium Bromide Monohydrate (SPIRIVA RESPIMAT) 2.5 MCG/ACT AERS Inhale 2 puffs into the lungs at bedtime.     UNABLE TO FIND Med Name: Azelastine 137 mcg/ spray 2 times in each nostril daily.     Vitamins A & D (VITAMIN A & D) ointment Apply 1 Application topically as needed for dry skin (on bottoms).     No current facility-administered medications for this visit.    Physical Examination  Vitals:   06/14/23 1445  BP: 120/78  Pulse: 74  Temp: 97.6 F (36.4 C)  TempSrc: Temporal  SpO2: 98%  Weight: 121 lb 6.4 oz (55.1 kg)  Height: 5\' 6"  (1.676 m)    Body mass index is 19.59 kg/m.  General:  Alert and oriented, no acute distress HEENT: Normal Neck: No  bruit or JVD Pulmonary: Clear to auscultation bilaterally Cardiac: Regular Rate and Rhythm without murmur Abdomen: Soft, non-tender, non-distended, no mass, no scars Skin: No rash Extremity Pulses:  2+ radial, brachial, femoral, dorsalis pedis, posterior tibial pulses bilaterally Musculoskeletal: No deformity or edema  Neurologic: Upper and lower extremity motor 5/5 and symmetric  DATA: ***   ASSESSMENT: ***   PLAN: ***   Mosetta Pigeon PA-C [facility] [place] Office: [phone] Pager: [phone]

## 2023-06-16 ENCOUNTER — Encounter: Payer: Self-pay | Admitting: Physician Assistant

## 2023-06-24 ENCOUNTER — Other Ambulatory Visit: Payer: Self-pay

## 2023-06-24 DIAGNOSIS — I739 Peripheral vascular disease, unspecified: Secondary | ICD-10-CM

## 2023-06-27 ENCOUNTER — Telehealth: Payer: Self-pay

## 2023-06-27 ENCOUNTER — Ambulatory Visit: Payer: Medicare Other | Attending: Internal Medicine

## 2023-06-27 DIAGNOSIS — Z95810 Presence of automatic (implantable) cardiac defibrillator: Secondary | ICD-10-CM

## 2023-06-27 DIAGNOSIS — I5022 Chronic systolic (congestive) heart failure: Secondary | ICD-10-CM | POA: Diagnosis not present

## 2023-06-27 MED ORDER — METOPROLOL SUCCINATE ER 25 MG PO TB24: 12.5000 mg | ORAL_TABLET | Freq: Two times a day (BID) | ORAL | 3 refills | Status: DC

## 2023-06-27 NOTE — Telephone Encounter (Signed)
Discussed with Dr. Ladona Ridgel.  Per Dr. Angela Adam increase coreg if BP allows, or change to Toprol XL 25 mg-1/2 tablet PO BID.  Spoke with Pt's daughter.  Per daughter the last time they increased carvedilol Pt's blood pressure bottomed out.  Advised to stop carvedilol.  Start Toprol 25 mg-Take 1/2 tablet by mouth twice a day.  Advised would continue to follow remotely.  May need to increase Toprol.  Daughter states Pt is having trouble controlling diabetes through [place].  Advised to request a referral for diabetes management.  Await further needs.

## 2023-06-27 NOTE — Telephone Encounter (Signed)
Alert received from [facility]:  Remote alert for exceeded AF burden, atrial sensitivity safety margin <2:1.  Total burden 13%, recent increase in AF burden is observed on trend, currently 100% since 10/5 @ 0359.  1 VT on 06/24/23 with ATP x 3.  Within the monitoring period, HF diagnostics have been abnormal. Routing to triage for increased AF burden and VT detection with therapies delivered.  Reviewed strip.  Appears to be Afib with RVR that was treated.   Will review with Dr. Ladona Ridgel.

## 2023-06-28 ENCOUNTER — Telehealth: Payer: Self-pay | Admitting: Internal Medicine

## 2023-06-28 NOTE — Telephone Encounter (Signed)
06/27/2023 Corvue thoracic impedance suggesting normal fluid levels since 10/2.

## 2023-06-28 NOTE — Telephone Encounter (Signed)
Thoracic impedance actually looks normal, so not indicative of volume overload.  However, with weight gain, he can increase Lasix to 40 mg daily x 2 days then back to 20 mg daily.

## 2023-06-28 NOTE — Telephone Encounter (Signed)
Pt c/o swelling: STAT is pt has developed SOB within 24 hours  How much weight have you gained and in what time span? 5 lb in day   If swelling, where is the swelling located? Not currently showing   Are you currently taking a fluid pill? Yes   Are you currently SOB? Yes, started a few days ago.   Do you have a log of your daily weights (if so, list)? 10/06 -   122 lb                       10/08  -  127 lb  Have you gained 3 pounds in a day or 5 pounds in a week? Yes   Have you traveled recently? No

## 2023-06-29 ENCOUNTER — Encounter (HOSPITAL_COMMUNITY): Payer: Self-pay | Admitting: Emergency Medicine

## 2023-06-29 ENCOUNTER — Encounter (HOSPITAL_COMMUNITY): Payer: Self-pay | Admitting: Cardiology

## 2023-06-29 ENCOUNTER — Inpatient Hospital Stay (HOSPITAL_COMMUNITY)
Admission: EM | Admit: 2023-06-29 | Discharge: 2023-07-02 | DRG: 291 | Disposition: A | Discharge status: Home Health | Payer: Medicare Other | Attending: Internal Medicine | Admitting: Internal Medicine

## 2023-06-29 DIAGNOSIS — Z95810 Presence of automatic (implantable) cardiac defibrillator: Secondary | ICD-10-CM | POA: Diagnosis not present

## 2023-06-29 DIAGNOSIS — I509 Heart failure, unspecified: Secondary | ICD-10-CM

## 2023-06-29 DIAGNOSIS — E875 Hyperkalemia: Secondary | ICD-10-CM | POA: Diagnosis present

## 2023-06-29 DIAGNOSIS — R570 Cardiogenic shock: Secondary | ICD-10-CM | POA: Diagnosis present

## 2023-06-29 DIAGNOSIS — I4892 Unspecified atrial flutter: Secondary | ICD-10-CM | POA: Diagnosis present

## 2023-06-29 DIAGNOSIS — E1142 Type 2 diabetes mellitus with diabetic polyneuropathy: Secondary | ICD-10-CM | POA: Diagnosis present

## 2023-06-29 DIAGNOSIS — Z7951 Long term (current) use of inhaled steroids: Secondary | ICD-10-CM

## 2023-06-29 DIAGNOSIS — D649 Anemia, unspecified: Secondary | ICD-10-CM | POA: Diagnosis present

## 2023-06-29 DIAGNOSIS — K219 Gastro-esophageal reflux disease without esophagitis: Secondary | ICD-10-CM | POA: Diagnosis present

## 2023-06-29 DIAGNOSIS — K761 Chronic passive congestion of liver: Secondary | ICD-10-CM | POA: Diagnosis present

## 2023-06-29 DIAGNOSIS — J449 Chronic obstructive pulmonary disease, unspecified: Secondary | ICD-10-CM | POA: Diagnosis present

## 2023-06-29 DIAGNOSIS — Z9049 Acquired absence of other specified parts of digestive tract: Secondary | ICD-10-CM

## 2023-06-29 DIAGNOSIS — Z800 Family history of malignant neoplasm of digestive organs: Secondary | ICD-10-CM

## 2023-06-29 DIAGNOSIS — I4891 Unspecified atrial fibrillation: Secondary | ICD-10-CM | POA: Diagnosis present

## 2023-06-29 DIAGNOSIS — Z7982 Long term (current) use of aspirin: Secondary | ICD-10-CM

## 2023-06-29 DIAGNOSIS — N179 Acute kidney failure, unspecified: Secondary | ICD-10-CM | POA: Diagnosis present

## 2023-06-29 DIAGNOSIS — E1151 Type 2 diabetes mellitus with diabetic peripheral angiopathy without gangrene: Secondary | ICD-10-CM | POA: Diagnosis present

## 2023-06-29 DIAGNOSIS — R4189 Other symptoms and signs involving cognitive functions and awareness: Secondary | ICD-10-CM | POA: Diagnosis present

## 2023-06-29 DIAGNOSIS — R0602 Shortness of breath: Secondary | ICD-10-CM | POA: Diagnosis not present

## 2023-06-29 DIAGNOSIS — E785 Hyperlipidemia, unspecified: Secondary | ICD-10-CM | POA: Diagnosis present

## 2023-06-29 DIAGNOSIS — G4700 Insomnia, unspecified: Secondary | ICD-10-CM | POA: Diagnosis present

## 2023-06-29 DIAGNOSIS — I340 Nonrheumatic mitral (valve) insufficiency: Secondary | ICD-10-CM | POA: Diagnosis not present

## 2023-06-29 DIAGNOSIS — I428 Other cardiomyopathies: Secondary | ICD-10-CM | POA: Diagnosis present

## 2023-06-29 DIAGNOSIS — R5381 Other malaise: Secondary | ICD-10-CM | POA: Diagnosis present

## 2023-06-29 DIAGNOSIS — Z1152 Encounter for screening for COVID-19: Secondary | ICD-10-CM

## 2023-06-29 DIAGNOSIS — E1169 Type 2 diabetes mellitus with other specified complication: Secondary | ICD-10-CM | POA: Diagnosis present

## 2023-06-29 DIAGNOSIS — E1165 Type 2 diabetes mellitus with hyperglycemia: Secondary | ICD-10-CM | POA: Diagnosis present

## 2023-06-29 DIAGNOSIS — Z950 Presence of cardiac pacemaker: Secondary | ICD-10-CM | POA: Diagnosis not present

## 2023-06-29 DIAGNOSIS — I5023 Acute on chronic systolic (congestive) heart failure: Secondary | ICD-10-CM | POA: Diagnosis present

## 2023-06-29 DIAGNOSIS — I493 Ventricular premature depolarization: Secondary | ICD-10-CM | POA: Diagnosis present

## 2023-06-29 DIAGNOSIS — Z7984 Long term (current) use of oral hypoglycemic drugs: Secondary | ICD-10-CM

## 2023-06-29 DIAGNOSIS — F17290 Nicotine dependence, other tobacco product, uncomplicated: Secondary | ICD-10-CM | POA: Diagnosis present

## 2023-06-29 DIAGNOSIS — I110 Hypertensive heart disease with heart failure: Secondary | ICD-10-CM | POA: Diagnosis not present

## 2023-06-29 DIAGNOSIS — Z95820 Peripheral vascular angioplasty status with implants and grafts: Secondary | ICD-10-CM

## 2023-06-29 DIAGNOSIS — Z792 Long term (current) use of antibiotics: Secondary | ICD-10-CM

## 2023-06-29 DIAGNOSIS — I2510 Atherosclerotic heart disease of native coronary artery without angina pectoris: Secondary | ICD-10-CM | POA: Diagnosis present

## 2023-06-29 DIAGNOSIS — R54 Age-related physical debility: Secondary | ICD-10-CM | POA: Diagnosis present

## 2023-06-29 DIAGNOSIS — I480 Paroxysmal atrial fibrillation: Secondary | ICD-10-CM | POA: Diagnosis present

## 2023-06-29 DIAGNOSIS — I4510 Unspecified right bundle-branch block: Secondary | ICD-10-CM | POA: Diagnosis present

## 2023-06-29 DIAGNOSIS — I252 Old myocardial infarction: Secondary | ICD-10-CM

## 2023-06-29 DIAGNOSIS — Z888 Allergy status to other drugs, medicaments and biological substances status: Secondary | ICD-10-CM

## 2023-06-29 DIAGNOSIS — I5021 Acute systolic (congestive) heart failure: Secondary | ICD-10-CM | POA: Diagnosis not present

## 2023-06-29 DIAGNOSIS — I472 Ventricular tachycardia, unspecified: Secondary | ICD-10-CM | POA: Diagnosis present

## 2023-06-29 DIAGNOSIS — R748 Abnormal levels of other serum enzymes: Secondary | ICD-10-CM | POA: Diagnosis not present

## 2023-06-29 DIAGNOSIS — Z7901 Long term (current) use of anticoagulants: Secondary | ICD-10-CM | POA: Diagnosis not present

## 2023-06-29 DIAGNOSIS — R0989 Other specified symptoms and signs involving the circulatory and respiratory systems: Secondary | ICD-10-CM | POA: Diagnosis not present

## 2023-06-29 DIAGNOSIS — Z951 Presence of aortocoronary bypass graft: Secondary | ICD-10-CM

## 2023-06-29 DIAGNOSIS — E871 Hypo-osmolality and hyponatremia: Secondary | ICD-10-CM | POA: Diagnosis not present

## 2023-06-29 DIAGNOSIS — Q2381 Bicuspid aortic valve: Secondary | ICD-10-CM

## 2023-06-29 DIAGNOSIS — I70203 Unspecified atherosclerosis of native arteries of extremities, bilateral legs: Secondary | ICD-10-CM | POA: Diagnosis present

## 2023-06-29 DIAGNOSIS — Z79899 Other long term (current) drug therapy: Secondary | ICD-10-CM

## 2023-06-29 DIAGNOSIS — I255 Ischemic cardiomyopathy: Secondary | ICD-10-CM | POA: Diagnosis present

## 2023-06-29 DIAGNOSIS — I5043 Acute on chronic combined systolic (congestive) and diastolic (congestive) heart failure: Secondary | ICD-10-CM | POA: Diagnosis not present

## 2023-06-29 DIAGNOSIS — Z8711 Personal history of peptic ulcer disease: Secondary | ICD-10-CM

## 2023-06-29 NOTE — Telephone Encounter (Signed)
Daughter aware.

## 2023-06-29 NOTE — Progress Notes (Signed)
EPIC Encounter for ICM Monitoring  Patient Name: [name] [name] is a 77 y.o. male Date: 06/29/2023 Primary Care Physican: [facility] Primary Cardiologist: [facility] Electrophysiologist: Rae Roam Pacing: 90%     08/31/2022 Weight: 127 lbs 10/13/2022 Weight: 130 lbs 01/28/2023 Weight: 125 lbs 04/11/2023 Weight: 116 lbs   AT/AF Burden 20% VT x 1 on 10/4 with ATP x 3     Daughter spoke with triage on 10/8 and reported patient has gained 5 lb in a day, weight increased from 122 lbs to 127 lbs.   Dr Shirlee Latch advised patient to increase Lasix to 40 mg x 2 days.       Corvue thoracic impedance suggesting normal fluid levels.  ATP and increased AT/AF addressed by device clinic triage nurse and Dr Ladona Ridgel.   Prescribed:  Furosemide 20 mg Take 1 tablet(s) (20 mg total) by mouth daily.   Spironolactone 25 mg take 0.5 tablet (12.5 mg total) by mouth daily   Labs: 06/13/2023 Creatinine 0.87, BUN 15, Potassium 4.4, Sodium 132, GFR >60 05/12/2023 Creatinine 0.97, BUN 13, Potassium 4.8, Sodium 134, GFR >60 05/02/2023 Creatinine 0.89, BUN 14, Potassium 4.5, Sodium 132, GFR >60 04/11/2023 Creatinine 0.89, BUN 17, Potassium 4.5, Sodium 129, GFR >60 03/08/2023 Creatinine 0.75, BUN 9,   Potassium 4.7, Sodium 130, GFR 94  03/03/2023 Creatinine 0.72, BUN 10, Potassium 5.6, Sodium 128, GFR 95  02/18/2023 Creatinine 0.92, BUN 16, Potassium 3.6, Sodium 129  11/20/2021 Creatinine 0.86, BUN 8,   Potassium 5.2, Sodium 131, GFR 91 11/11/2021 Creatinine 0.88, BUN 10, Potassium 5.5, Sodium 128, GFR 91 10/07/2021 Creatinine 0.86, BUN 8,   Potassium 5.3, Sodium 131, GFR 91 A complete set of results can be found in Results Review.   Recommendations:  Recommendations given by Dr Shirlee Latch.   Follow-up plan: ICM clinic phone appointment on 08/01/2023   91 day device clinic remote transmission 07/15/2023.     EP/Cardiology Office Visits:  07/29/2023 with Dr Excell Seltzer.  08/17/2023 with Dr Herbie Baltimore.   09/06/2023 with Dr Shirlee Latch.     Copy of ICM check sent to Dr. Ladona Ridgel.  3 month ICM trend: 06/27/2023.    12-14 Month ICM trend:     Karie Soda, RN 06/29/2023 4:05 PM

## 2023-06-29 NOTE — ED Notes (Signed)
Warm blankets given and pt wrapped up/

## 2023-06-29 NOTE — ED Triage Notes (Addendum)
Pt reports "blood sugar all out of wack". States having difficulty sleeping and lack of appetite. No complaints of pain or any SOB. Wife and dtr with him today.

## 2023-06-30 ENCOUNTER — Emergency Department (HOSPITAL_COMMUNITY): Payer: Medicare Other

## 2023-06-30 ENCOUNTER — Other Ambulatory Visit: Payer: Self-pay

## 2023-06-30 ENCOUNTER — Observation Stay (HOSPITAL_COMMUNITY): Payer: Medicare Other

## 2023-06-30 DIAGNOSIS — I5023 Acute on chronic systolic (congestive) heart failure: Secondary | ICD-10-CM

## 2023-06-30 DIAGNOSIS — Z9049 Acquired absence of other specified parts of digestive tract: Secondary | ICD-10-CM | POA: Diagnosis not present

## 2023-06-30 DIAGNOSIS — R748 Abnormal levels of other serum enzymes: Secondary | ICD-10-CM | POA: Diagnosis not present

## 2023-06-30 DIAGNOSIS — I509 Heart failure, unspecified: Secondary | ICD-10-CM

## 2023-06-30 DIAGNOSIS — I4891 Unspecified atrial fibrillation: Secondary | ICD-10-CM

## 2023-06-30 LAB — COMPREHENSIVE METABOLIC PANEL
ALT: 286 U/L — ABNORMAL HIGH (ref 0–44)
ALT: 515 U/L — ABNORMAL HIGH (ref 0–44)
AST: 250 U/L — ABNORMAL HIGH (ref 15–41)
AST: 439 U/L — ABNORMAL HIGH (ref 15–41)
Albumin: 3.7 g/dL (ref 3.5–5.0)
Albumin: 3.8 g/dL (ref 3.5–5.0)
Alkaline Phosphatase: 87 U/L (ref 38–126)
Alkaline Phosphatase: 91 U/L (ref 38–126)
Anion gap: 15 (ref 5–15)
Anion gap: 15 (ref 5–15)
BUN: 40 mg/dL — ABNORMAL HIGH (ref 8–23)
BUN: 42 mg/dL — ABNORMAL HIGH (ref 8–23)
CO2: 19 mmol/L — ABNORMAL LOW (ref 22–32)
CO2: 21 mmol/L — ABNORMAL LOW (ref 22–32)
Calcium: 9 mg/dL (ref 8.9–10.3)
Calcium: 9 mg/dL (ref 8.9–10.3)
Chloride: 90 mmol/L — ABNORMAL LOW (ref 98–111)
Chloride: 95 mmol/L — ABNORMAL LOW (ref 98–111)
Creatinine, Ser: 1.26 mg/dL — ABNORMAL HIGH (ref 0.61–1.24)
Creatinine, Ser: 1.29 mg/dL — ABNORMAL HIGH (ref 0.61–1.24)
GFR, Estimated: 58 mL/min — ABNORMAL LOW (ref >60)
GFR, Estimated: 59 mL/min — ABNORMAL LOW (ref >60)
Glucose, Bld: 183 mg/dL — ABNORMAL HIGH (ref 70–99)
Glucose, Bld: 240 mg/dL — ABNORMAL HIGH (ref 70–99)
Potassium: 5.3 mmol/L — ABNORMAL HIGH (ref 3.5–5.1)
Potassium: 5.5 mmol/L — ABNORMAL HIGH (ref 3.5–5.1)
Sodium: 126 mmol/L — ABNORMAL LOW (ref 135–145)
Sodium: 129 mmol/L — ABNORMAL LOW (ref 135–145)
Total Bilirubin: 0.9 mg/dL (ref 0.3–1.2)
Total Bilirubin: 0.9 mg/dL (ref 0.3–1.2)
Total Protein: 6.2 g/dL — ABNORMAL LOW (ref 6.5–8.1)
Total Protein: 6.7 g/dL (ref 6.5–8.1)

## 2023-06-30 LAB — URINALYSIS, ROUTINE W REFLEX MICROSCOPIC
Bacteria, UA: NONE SEEN
Bilirubin Urine: NEGATIVE
Glucose, UA: >=500 mg/dL — AB
Hgb urine dipstick: NEGATIVE
Ketones, ur: 5 mg/dL — AB
Leukocytes,Ua: NEGATIVE
Nitrite: NEGATIVE
Protein, ur: 30 mg/dL — AB
Specific Gravity, Urine: 1.021 (ref 1.005–1.030)
pH: 5 (ref 5.0–8.0)

## 2023-06-30 LAB — LACTIC ACID, PLASMA
Lactic Acid, Venous: 3.9 mmol/L (ref 0.5–1.9)
Lactic Acid, Venous: 4.6 mmol/L (ref 0.5–1.9)

## 2023-06-30 LAB — CBC
HCT: 38.8 % — ABNORMAL LOW (ref 39.0–52.0)
Hemoglobin: 12.6 g/dL — ABNORMAL LOW (ref 13.0–17.0)
MCH: 26.4 pg (ref 26.0–34.0)
MCHC: 32.5 g/dL (ref 30.0–36.0)
MCV: 81.3 fL (ref 80.0–100.0)
Platelets: 284 10*3/uL (ref 150–400)
RBC: 4.77 MIL/uL (ref 4.22–5.81)
RDW: 15.6 % — ABNORMAL HIGH (ref 11.5–15.5)
WBC: 9.6 10*3/uL (ref 4.0–10.5)
nRBC: 0.4 % — ABNORMAL HIGH (ref 0.0–0.2)

## 2023-06-30 LAB — RESP PANEL BY RT-PCR (RSV, FLU A&B, COVID)  RVPGX2
Influenza A by PCR: NEGATIVE
Influenza B by PCR: NEGATIVE
Resp Syncytial Virus by PCR: NEGATIVE
SARS Coronavirus 2 by RT PCR: NEGATIVE

## 2023-06-30 LAB — CBG MONITORING, ED
Glucose-Capillary: 210 mg/dL — ABNORMAL HIGH (ref 70–99)
Glucose-Capillary: 194 mg/dL — ABNORMAL HIGH (ref 70–99)
Glucose-Capillary: 201 mg/dL — ABNORMAL HIGH (ref 70–99)
Glucose-Capillary: 137 mg/dL — ABNORMAL HIGH (ref 70–99)

## 2023-06-30 LAB — BRAIN NATRIURETIC PEPTIDE: B Natriuretic Peptide: 2925.9 pg/mL — ABNORMAL HIGH (ref 0.0–100.0)

## 2023-06-30 LAB — PROTIME-INR
INR: 2 — ABNORMAL HIGH (ref 0.8–1.2)
Prothrombin Time: 22.5 s — ABNORMAL HIGH (ref 11.4–15.2)

## 2023-06-30 LAB — DIGOXIN LEVEL: Digoxin Level: 0.9 ng/mL (ref 0.8–2.0)

## 2023-06-30 LAB — GLUCOSE, CAPILLARY: Glucose-Capillary: 132 mg/dL — ABNORMAL HIGH (ref 70–99)

## 2023-06-30 LAB — TROPONIN I (HIGH SENSITIVITY)
Troponin I (High Sensitivity): 62 ng/L — ABNORMAL HIGH (ref <18)
Troponin I (High Sensitivity): 64 ng/L — ABNORMAL HIGH (ref <18)

## 2023-06-30 MED ORDER — HYDRALAZINE HCL 20 MG/ML IJ SOLN: 10.0000 mg | Freq: Four times a day (QID) | INTRAMUSCULAR | Status: DC | PRN

## 2023-06-30 MED ORDER — ATORVASTATIN CALCIUM 40 MG PO TABS
40.0000 mg | ORAL_TABLET | Freq: Every day | ORAL | Status: DC
  Administered 2023-06-30 – 2023-07-02 (×3): 40 mg via ORAL
  Filled 2023-06-30 (×3): qty 1

## 2023-06-30 MED ORDER — FUROSEMIDE 10 MG/ML IJ SOLN
80.0000 mg | Freq: Two times a day (BID) | INTRAMUSCULAR | Status: DC
  Administered 2023-06-30 – 2023-07-01 (×2): 80 mg via INTRAVENOUS
  Filled 2023-06-30 (×2): qty 8

## 2023-06-30 MED ORDER — NICOTINE 21 MG/24HR TD PT24
21.0000 mg | MEDICATED_PATCH | Freq: Every day | TRANSDERMAL | Status: DC
  Administered 2023-06-30 – 2023-07-02 (×3): 21 mg via TRANSDERMAL
  Filled 2023-06-30 (×3): qty 1

## 2023-06-30 MED ORDER — MAGNESIUM SULFATE 2 GM/50ML IV SOLN
2.0000 g | Freq: Once | INTRAVENOUS | Status: AC
  Administered 2023-06-30: 2 g via INTRAVENOUS
  Filled 2023-06-30: qty 50

## 2023-06-30 MED ORDER — INSULIN ASPART 100 UNIT/ML IJ SOLN: 0.0000 [IU] | Freq: Every day | INTRAMUSCULAR | Status: DC

## 2023-06-30 MED ORDER — ACETAMINOPHEN 650 MG RE SUPP: 650.0000 mg | Freq: Four times a day (QID) | RECTAL | Status: DC | PRN

## 2023-06-30 MED ORDER — FUROSEMIDE 10 MG/ML IJ SOLN: 80.0000 mg | Freq: Two times a day (BID) | INTRAMUSCULAR | Status: DC

## 2023-06-30 MED ORDER — DIGOXIN 125 MCG PO TABS
0.2500 mg | ORAL_TABLET | Freq: Once | ORAL | Status: AC
  Administered 2023-06-30: 0.25 mg via ORAL
  Filled 2023-06-30: qty 2

## 2023-06-30 MED ORDER — ACETAMINOPHEN 325 MG PO TABS
650.0000 mg | ORAL_TABLET | Freq: Four times a day (QID) | ORAL | Status: DC | PRN
  Administered 2023-07-01: 650 mg via ORAL
  Filled 2023-06-30: qty 2

## 2023-06-30 MED ORDER — APIXABAN 5 MG PO TABS
5.0000 mg | ORAL_TABLET | Freq: Two times a day (BID) | ORAL | Status: DC
  Administered 2023-06-30 – 2023-07-02 (×5): 5 mg via ORAL
  Filled 2023-06-30 (×5): qty 1

## 2023-06-30 MED ORDER — MEXILETINE HCL 150 MG PO CAPS
150.0000 mg | ORAL_CAPSULE | Freq: Two times a day (BID) | ORAL | Status: DC
  Administered 2023-06-30 – 2023-07-02 (×4): 150 mg via ORAL
  Filled 2023-06-30 (×5): qty 1

## 2023-06-30 MED ORDER — INSULIN ASPART 100 UNIT/ML IJ SOLN
0.0000 [IU] | Freq: Three times a day (TID) | INTRAMUSCULAR | Status: DC
  Administered 2023-06-30: 1 [IU] via SUBCUTANEOUS
  Administered 2023-06-30 – 2023-07-01 (×2): 3 [IU] via SUBCUTANEOUS
  Administered 2023-07-01 – 2023-07-02 (×2): 2 [IU] via SUBCUTANEOUS

## 2023-06-30 MED ORDER — DIGOXIN 125 MCG PO TABS
0.1250 mg | ORAL_TABLET | Freq: Every day | ORAL | Status: DC
  Administered 2023-07-01 – 2023-07-02 (×2): 0.125 mg via ORAL
  Filled 2023-06-30 (×2): qty 1

## 2023-06-30 MED ORDER — METOPROLOL SUCCINATE ER 25 MG PO TB24
12.5000 mg | ORAL_TABLET | Freq: Every day | ORAL | Status: DC
  Administered 2023-06-30 – 2023-07-02 (×3): 12.5 mg via ORAL
  Filled 2023-06-30 (×3): qty 1

## 2023-06-30 MED ORDER — PANTOPRAZOLE SODIUM 40 MG PO TBEC
40.0000 mg | DELAYED_RELEASE_TABLET | Freq: Every day | ORAL | Status: DC
  Administered 2023-06-30 – 2023-07-02 (×3): 40 mg via ORAL
  Filled 2023-06-30 (×3): qty 1

## 2023-06-30 MED ORDER — ALBUTEROL SULFATE (2.5 MG/3ML) 0.083% IN NEBU: 2.5000 mg | INHALATION_SOLUTION | Freq: Four times a day (QID) | RESPIRATORY_TRACT | Status: DC | PRN

## 2023-06-30 MED ORDER — ENSURE ENLIVE PO LIQD
237.0000 mL | Freq: Two times a day (BID) | ORAL | Status: DC
  Administered 2023-07-02: 237 mL via ORAL
  Filled 2023-06-30: qty 237

## 2023-06-30 MED ORDER — FUROSEMIDE 10 MG/ML IJ SOLN
80.0000 mg | Freq: Once | INTRAMUSCULAR | Status: AC
  Administered 2023-06-30: 80 mg via INTRAVENOUS
  Filled 2023-06-30: qty 8

## 2023-06-30 MED ORDER — ONDANSETRON HCL 4 MG/2ML IJ SOLN
4.0000 mg | Freq: Once | INTRAMUSCULAR | Status: AC
  Administered 2023-06-30: 4 mg via INTRAVENOUS
  Filled 2023-06-30: qty 2

## 2023-06-30 MED ORDER — ETOMIDATE 2 MG/ML IV SOLN
8.0000 mg | Freq: Once | INTRAVENOUS | Status: AC
  Administered 2023-06-30: 8 mg via INTRAVENOUS
  Filled 2023-06-30: qty 10

## 2023-06-30 MED ORDER — PHENYLEPHRINE 80 MCG/ML (10ML) SYRINGE FOR IV PUSH (FOR BLOOD PRESSURE SUPPORT)
80.0000 ug | PREFILLED_SYRINGE | Freq: Once | INTRAVENOUS | Status: DC | PRN
  Filled 2023-06-30: qty 10

## 2023-06-30 NOTE — Consult Note (Addendum)
Advanced Heart Failure Team Consult Note   Primary Physician: [facility] PCP-Cardiologist:  Bryan Lemma, MD  Reason for Consultation: A/C HFrEF  HPI:    [name] [name] is seen today for evaluation of A/C HFrEF at the request of Dr. Pola Corn, Internal Medicine.   77 y.o. with history of CAD s/p CABG, ischemic cardiomyopathy, PAD s/p aortobifemoral bypass, PAF, and severe mitral regurgitation.  Patient had MI in 2/14 with cath showing severe 3VD.  CABG in 2/14 with LIMA-LAD, SVG-PDA, SVG-OM1. Subsequently developed ischemic cardiomyopathy.  He had St Jude CRT-D device placed.  Echo 1/24 showed EF 40-45%, basal-mid AL and IL AK, basal inferior AK, mild LVH, moderate RV dysfunction with PASP 67 mmHG, severe LAE, severe infarct-related MR, mild-moderate TR.  He also has history of aortobifemoral bypass in 2014.  He had left SFA stent in 2022 for tissue loss in the left great toe. 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  Had breakthrough atrial fibrillation on sotalol with inappropriate ICD shock and is now off sotalol. He has been on mexiletine for frequent PVCs.    Patient's mitral regurgitation has been evaluated recently by Dr. Excell Seltzer for possible mTEER.  Cath in 7/24 showed elevated filling pressures and low cardiac output.  Bypass grafts were patent. TEE in 7/24 showed EF 30-35%, severe MR with restricted posterior leaflet and small flail segment in posterior leaflet, RV normal, mild AI.    Patient was started on spironolactone, developed mood changes (episodes of "rage") around that time so family had him stop it.  Mood is back to normal.   He presented to the ED last night with shortness of breath, decreased appetite, nausea, hyperglycemia and because he was unable to sleep. Symptoms started about a month ago and have progressively gotten worst. He's been trying to keep his calorie intake by drinking at least 2 ensures a day.  However despite decreased appetite they have noticed an at least 5 lb weight gain over the last week. Recently he was instructed by AHF clinic to increase his diuretics but that did not help> has been compliant with all medications. Labs/studies reviewed: K 5.5, Na 129, CO2 19, SCR 1.29 (baseline 0.8), AST 439, ALT 515, BNP almost 3K, HsTrop 62>64. Resp panel (-), CXR with pulmonary congestion. EKG with a fib RVR.   Wife at bedside. Overall he's feeling a little better. In a fib RVR on assessment but denies feeling poorly.   Working on [employer], contacted device rep.   Cardiac studies reviewed:  - Echo (12/17): EF 20% - Echo (1/24): EF 40-45%, basal-mid AL and IL AK, basal inferior AK, mild LVH, moderate RV dysfunction with PASP 67 mmHG, severe LAE, severe infarct-related MR, mild-moderate TR.  - LHC/RHC (7/24): mean RA 10, PA 63/35 mean 46, mean PCWP 27 with v waves to 35, CI 1.5 F. Bypass grafts were patent  - TEE (7/24): EF 30-35%, severe MR with restricted posterior leaflet and small flail segment in posterior leaflet, RV normal, mild AI.   Home Medications Prior to Admission medications   Medication Sig Start Date End Date Taking? Authorizing Provider  atorvastatin (LIPITOR) 40 MG tablet Take by mouth. 10/29/22  Yes [provider]  Cholecalciferol 50 MCG (2000 UT) TABS Take by mouth. 10/29/22  Yes [provider]  naproxen (NAPROSYN) 500 MG tablet Take by mouth. 10/29/22  Yes [provider]  Tiotropium Bromide Monohydrate 2.5 MCG/ACT AERS Take by mouth. 10/29/22  Yes [provider]  valsartan (DIOVAN) 40 MG tablet Take by mouth. 10/29/22  Yes [provider]  acetaminophen (TYLENOL) 650 MG CR tablet Take 650 mg by mouth every 8 (eight) hours as needed for pain.    [provider]  albuterol (VENTOLIN HFA) 108 (90 Base) MCG/ACT inhaler Inhale 1-2 puffs into the lungs every 6 (six) hours as needed (COPD).    [provider]  Alpha-Lipoic Acid 600 MG TABS Take 600 mg by mouth daily.    [provider]  apixaban (ELIQUIS) 5 MG TABS tablet Take 1 tablet (5 mg total) by mouth 2 (two) times daily. 02/18/23   Camnitz, Andree Coss, MD  aspirin EC 81 MG tablet Take 81 mg by mouth daily.    [provider]  atorvastatin (LIPITOR) 80 MG tablet Take 40 mg by mouth every evening. 04/14/20   [provider]  bacitracin ointment Apply 1 Application topically 2 (two) times daily as needed for wound care.    [provider]  cholecalciferol (VITAMIN D) 25 MCG (1000 UNIT) tablet Take 2,000 Units by mouth every evening.    [provider]  dicloxacillin (DYNAPEN) 250 MG capsule Take 2 capsules (500 mg total) by mouth 3 (three) times daily. Patient taking differently: Take 500 mg by mouth 2 (two) times daily. 01/25/23   Tonny Bollman, MD  digoxin (LANOXIN) 0.125 MG tablet Take 1 tablet (0.125 mg total) by mouth daily. Monday through Friday only, None on Saturday and Sunday 06/13/23   Jacklynn Ganong, FNP  diphenhydrAMINE (BENADRYL) 25 mg capsule Take 25 mg by mouth every 6 (six) hours as needed for sleep.    [provider]  empagliflozin (JARDIANCE) 25 MG TABS tablet Take .5mg  Half tablet daily Patient taking differently: Take 0.5mg  Half tablet daily 04/12/23   Laurey Morale, MD  fexofenadine (ALLEGRA) 180 MG tablet Take 180 mg by mouth every evening.    [provider]  fluticasone (FLONASE) 50 MCG/ACT nasal spray Place 2 sprays into both nostrils daily as needed for allergies or rhinitis.    [provider]  furosemide (LASIX) 20 MG tablet Take 1 tablet (20 mg total) by mouth daily. 05/02/23   Laurey Morale, MD  glipiZIDE (GLUCOTROL) 5 MG tablet Take 5 mg by mouth 2 (two) times daily before a meal.    [provider]  lidocaine (LIDODERM) 5 % Place 1 patch onto the skin daily as needed (For pain). 10/14/22   [provider]   magnesium oxide (MAG-OX) 400 (241.3 Mg) MG tablet Take 1 tablet (400 mg total) by mouth daily. 09/04/16   Creig Hines, NP  metFORMIN (GLUCOPHAGE) 1000 MG tablet Take 1 tablet (1,000 mg total) by mouth 2 (two) times daily with a meal. Resume on 09/06/2016 09/04/16   Creig Hines, NP  metoprolol succinate (TOPROL XL) 25 MG 24 hr tablet Take 0.5 tablets (12.5 mg total) by mouth in the morning and at bedtime. 06/27/23   Marinus Maw, MD  mexiletine (MEXITIL) 150 MG capsule Take 1 capsule (150 mg total) by mouth every 12 (twelve) hours. 02/18/23   Camnitz, Andree Coss, MD  nitroGLYCERIN (NITROSTAT) 0.4 MG SL tablet Place 1 tablet (0.4 mg total) under the tongue every 5 (five) minutes x 3 doses as needed for chest pain. 09/04/16   Creig Hines, NP  Nutritional Supplements (ENSURE PLUS PO) Take 1 Can by mouth 2 (two) times daily.    [provider]  OVER THE COUNTER MEDICATION Take 1 tablet by mouth See admin instructions. Relaxium every other night as needed    [provider]  pantoprazole (PROTONIX) 40 MG tablet Take 1 tablet (40 mg total) by mouth daily. 05/22/21   Marykay Lex, MD  spironolactone (ALDACTONE) 25 MG tablet Take 0.5 tablets (12.5 mg total) by mouth daily. 05/02/23   Laurey Morale, MD  Tiotropium Bromide Monohydrate (SPIRIVA RESPIMAT) 2.5 MCG/ACT AERS Inhale 2 puffs into the lungs at bedtime.    [provider]  UNABLE TO FIND Med Name: Azelastine 137 mcg/ spray 2 times in each nostril daily.    [provider]  Vitamins A & D (VITAMIN A & D) ointment Apply 1 Application topically as needed for dry skin (on bottoms).    [provider]    Past Medical History: Past Medical History:  Diagnosis Date   Acid reflux    AICD (automatic cardioverter/defibrillator) present    a. 08/2016 St. Jude (serial Number [id number]) biventricular ICD.   Anemia    Chronic systolic CHF (congestive heart failure) (HCC)     a. 08/2016 Echo: EF 20%, diffuse HK, inf, post AK, mod-sev MR, sev dil LA, mod TR, PASP .   COPD (chronic obstructive pulmonary disease) (HCC)    Coronary artery disease involving native coronary artery of native heart with angina pectoris (HCC)    a. 10/2012 MI after aortobifem bypass, found to have multivessel CAD -->CABG x3;  b. 08/2016 Cath: LM 60, LAD 40m, LCX 60ost/p, RCA 100p, VG->RPDA 50p, VG->OM1 ok, LIMA->LAD ok, EF 25%.   Essential hypertension 03/03/2012   History of blood transfusion 11/2012   "during his CABG"   History of gout X 1   History of stomach ulcers 1970s   Hyperlipidemia associated with type 2 diabetes mellitus (HCC) 09/16/2011   Ischemic cardiomyopathy    a. 08/2016 Echo: EF 20%, diffuse HK, inf, post AK;  b. 08/2016 St. Jude (serial Number [id number]) biventricular ICD.   Myocardial infarction [facility])    PAD (peripheral artery disease) (HCC)    s/p aortobifemoral bypass 10/2012   Peripheral neuropathy    PTSD (post-traumatic stress disorder)    Type II diabetes mellitus (HCC)    Ventricular tachycardia (HCC)    a. 08/2016 St. Jude (serial Number [id number]) biventricular ICD-->oral amio added.   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 Is now on chronic suppressive antibiotics with dicloxacillin    Past Surgical History: Past Surgical History:  Procedure Laterality Date   ABDOMINAL AORTOGRAM W/LOWER EXTREMITY Left 05/07/2021   Procedure: ABDOMINAL AORTOGRAM W/LOWER EXTREMITY;  Surgeon: [name], MD;  Location: [facility] INVASIVE CV LAB;  Service: Cardiovascular;  Laterality: Left;   AORTO-FEMORAL BYPASS GRAFT Bilateral 10/2012   [facility]   CARDIAC CATHETERIZATION  04/03/2013   [facility] [place] (? for abnormal Nuclear ST) --> no PCI, by report, patent grafts.   CARDIAC CATHETERIZATION N/A 09/02/2016   Procedure: Left Heart Cath and  Cors/Grafts Angiography;  Surgeon: Kathleene Hazel, MD;  Location: [facility] INVASIVE CV LAB:: Ost LM 60% -> mid LAD 99% subCTO, ost-prox LCx 60%; prox RCA 100% CTO w/ (small) SVG->RPDA prox 50%; SVG->OM1 ok, LIMA->mLAD ok, EF 25% - complicated by VT during access - Shock x 2, Lidocaine & Amiodarone   CARDIAC CATHETERIZATION  11/09/2012   [facility]: due to Sustained VT post-Ob Ao-BiFem Bypass. (no Angina)  CHOLECYSTECTOMY  03/05/2012   Procedure: LAPAROSCOPIC CHOLECYSTECTOMY WITH INTRAOPERATIVE CHOLANGIOGRAM;  Surgeon: Emelia Loron, MD;  Location: [facility] OR;  Service: General;  Laterality: N/A;   CORONARY ARTERY BYPASS GRAFT  11/2012   [facility]: CABG X 3 -> LIMA-mLAD, SVG-highOM1, SVG-RPDA (dRCA).   ENDARTERECTOMY FEMORAL Left 05/13/2021   Procedure: Left Femoral Endarterectomy WITH BOVINE PATCH PROFUNDOPLASTY, REVISION OF LEFT LIMB OF AORTO-BIFEMORAL GRAFT WITH DACRON GRAFT;  Surgeon: Cephus Shelling, MD;  Location: [facility] OR;  Service: Vascular;  Laterality: Left;   EP IMPLANTABLE DEVICE N/A 09/03/2016   Procedure: BI-VENTRICULAR IMPLANTABLE CARDIOVERTER DEFIBRILLATOR  (CRT-D);  Surgeon: Marinus Maw, MD;  Location: [facility] INVASIVE CV LAB;  Service: Cardiovascular;  Laterality: N/A;   INGUINAL HERNIA REPAIR Left 1990s   INSERTION OF ILIAC STENT Left 05/13/2021   Procedure: INSERTION OF SUPERFICIAL FEMORAL ARTERY STENT TIMES THREE AND ANGIOPLASTY;  Surgeon: Cephus Shelling, MD;  Location: [facility] OR;  Service: Vascular;  Laterality: Left;   INTRAOPERATIVE ARTERIOGRAM Left 05/13/2021   Procedure: INTRA OPERATIVE ARTERIOGRAM OF LEFT LOWER EXTREMITY;  Surgeon: Cephus Shelling, MD;  Location: [facility] OR;  Service: Vascular;  Laterality: Left;   NM MYOVIEW LTD  10/05/2022   EF ~28%.  Severely reduced function.  High risk due to decreased EF.  Large fixed severe defect in the mid to basal inferior and inferolateral wall with associated wall motion abnormality consistent with prior infarction.  No  evidence of reversible defect/ischemia. =? Echo EF 40-45% with mid to apical anterior/inferolateral akinesis and inferior akinesis.   RIGHT/LEFT HEART CATH AND CORONARY/GRAFT ANGIOGRAPHY N/A 03/25/2023   Procedure: RIGHT/LEFT HEART CATH AND CORONARY/GRAFT ANGIOGRAPHY;  Surgeon: Marykay Lex, MD;  [facility] INVASIVE CV LAB;  Service: Cardiovascular;  Laterality: N/A;   STERNOTOMY  09/2013   [facility]): Sternal Wound Infection -> re-do sternotomy with metal place "sheild" placed. -Has chronic staph infection, on chronic suppressive medication   TEE WITHOUT CARDIOVERSION N/A 03/25/2023   Procedure: TRANSESOPHAGEAL ECHOCARDIOGRAM;  Surgeon: Christell Constant, MD;  Location: [facility];  Service: Cardiovascular;  Laterality: N/A;   TRANSTHORACIC ECHOCARDIOGRAM  10/05/2022   EF 40 to 45%.  Basal to mid anterolateral and inferolateral akinesis with basal inferior akinesis.  GR 2 DD.  Severely elevated PAP with mild RV dilation.  RV P estimated 67 mmHg).  Severe LA dilation.  Mild to moderate RA dilation.  Mild to moderate TR.  Severe MR likely with restricted posterior leaflet and inferolateral wall motion.  Moderate AOV calcification with no stenosis.  Mild AI.   TRANSTHORACIC ECHOCARDIOGRAM  01/2021   EF 25 to 30%.  Severely depressed EF.  Global HK with worst basal to mid anterolateral and inferolateral as well as anterior basal inferior walls.  GR 1 DD.  Mildly reduced RV function.  Functionally bicuspid aortic valve with moderate calcification but no stenosis.  Overall slightly improved EF.   UMBILICAL HERNIA REPAIR  03/05/2012   Procedure: HERNIA REPAIR UMBILICAL ADULT;  Surgeon: Emelia Loron, MD;  Location: [facility] OR;  Service: General;  Laterality: N/A;    Family History: Family History  Problem Relation Age of Onset   Cancer Brother        esophageal    Social History: Social History   Socioeconomic History   Marital status: Married    Spouse name: Not on file    Number of children: Not on file   Years of education: Not on file   Highest education level: Not on file  Occupational History   Not on file  Tobacco Use   Smoking status: Every Day    Types: Pipe    Passive exposure: Never   Smokeless tobacco: Former    Types: Snuff, Chew   Tobacco comments:    09/03/2016 "used smokeless tobacco in my teens; quit smoking cigarettes in the early 1980s; smokes pipe only"  Vaping Use   Vaping status: Never Used  Substance and Sexual Activity   Alcohol use: No   Drug use: No   Sexual activity: Not on file  Other Topics Concern   Not on file  Social History Narrative   Routine activity around the house, but is limited more by musculoskeletal leg pain and weakness.      He enjoys smoking a pipe-long ago, he quit cigarettes   Social Determinants of Health   Financial Resource Strain: Not on file  Food Insecurity: No Food Insecurity (02/18/2023)   Hunger Vital Sign    Worried About Running Out of Food in the Last Year: Never true    Ran Out of Food in the Last Year: Never true  Transportation Needs: No Transportation Needs (02/18/2023)   PRAPARE - Administrator, Civil Service (Medical): No    Lack of Transportation (Non-Medical): No  Physical Activity: Not on file  Stress: Not on file  Social Connections: Not on file    Allergies:  Allergies  Allergen Reactions   Lisinopril     Other reaction(s): Cough    Objective:    Vital Signs:   Temp:  [96.5 F (35.8 C)-97.2 F (36.2 C)] 96.9 F (36.1 C) (10/10 1343) Pulse Rate:  [34-139] 139 (10/10 1400) Resp:  [12-18] 14 (10/10 1400) BP: (103-132)/(75-96) 112/80 (10/10 1400) SpO2:  [83 %-100 %] 98 % (10/10 1400)    Weight change: There were no vitals filed for this visit.  Intake/Output:  No intake or output data in the 24 hours ending 06/30/23 1434    Physical Exam    General:  elderly, weak appearing.  No respiratory difficulty HEENT: normal Neck: supple. JVD ~8  with v waves to mid neck. Carotids 2+ bilat; no bruits. No lymphadenopathy or thyromegaly appreciated. Cor: PMI nondisplaced. Irregular rate & rhythm. No rubs, gallops or murmurs. Lungs: diminished bases Abdomen: soft, nontender, distended. No hepatosplenomegaly. No bruits or masses. Good bowel sounds. Extremities: no cyanosis, clubbing, rash, +2 BLE edema to thighs. Cool BLE extremeties Neuro: alert & oriented x 3, cranial nerves grossly intact. moves all 4 extremities w/o difficulty. Affect pleasant.   Telemetry   A fib RVR 130s-140s (Personally reviewed)    EKG   A fib RVR 144 bpm  Labs   Basic Metabolic Panel: Recent Labs  Lab 06/29/23 2338 06/30/23 1007  NA 126* 129*  K 5.3* 5.5*  CL 90* 95*  CO2 21* 19*  GLUCOSE 240* 183*  BUN 40* 42*  CREATININE 1.26* 1.29*  CALCIUM 9.0 9.0    Liver Function Tests: Recent Labs  Lab 06/29/23 2338 06/30/23 1007  AST 250* 439*  ALT 286* 515*  ALKPHOS 91 87  BILITOT 0.9 0.9  PROT 6.7 6.2*  ALBUMIN 3.8 3.7   No results for input(s): "LIPASE", "AMYLASE" in the last 168 hours. No results for input(s): "AMMONIA" in the last 168 hours.  CBC: Recent Labs  Lab 06/29/23 2338  WBC 9.6  HGB 12.6*  HCT 38.8*  MCV 81.3  PLT 284    Cardiac Enzymes: No results for input(s): "CKTOTAL", "CKMB", "CKMBINDEX", "  TROPONINI" in the last 168 hours.  BNP: BNP (last 3 results) Recent Labs    05/02/23 1540 06/13/23 1616 06/30/23 0947  BNP 616.1* 561.0* 2,925.9*    ProBNP (last 3 results) No results for input(s): "PROBNP" in the last 8760 hours.   CBG: Recent Labs  Lab 06/29/23 2325 06/30/23 0750 06/30/23 1205  GLUCAP 210* 194* 201*    Coagulation Studies: Recent Labs    06/30/23 0947  LABPROT 22.5*  INR 2.0*     Imaging   DG Chest 2 View  Result Date: 06/30/2023 CLINICAL DATA:  sob EXAM: CHEST - 2 VIEW COMPARISON:  02/17/2023. FINDINGS: Mild pulmonary vascular congestion. Bilateral lung fields are otherwise  clear. No dense consolidation or lung collapse. No pneumothorax. Bilateral costophrenic angles are clear. Stable cardio-mediastinal silhouette. There is a left sided 3-lead pacemaker. Redemonstration of multiple sternal/anterior rib ORIF hardware is. No acute osseous abnormalities. The soft tissues are within normal limits. IMPRESSION: 1. Mild pulmonary vascular congestion. Electronically Signed   By: Jules Schick M.D.   On: 06/30/2023 09:33     Medications:     Current Medications:  apixaban  5 mg Oral BID   atorvastatin  40 mg Oral Daily   [START ON 07/01/2023] digoxin  0.125 mg Oral Daily   digoxin  0.25 mg Oral Once   furosemide  80 mg Intravenous BID   insulin aspart  0-5 Units Subcutaneous QHS   insulin aspart  0-9 Units Subcutaneous TID WC   metoprolol succinate  12.5 mg Oral Daily   mexiletine  150 mg Oral Q12H   pantoprazole  40 mg Oral Daily    Infusions:     Patient Profile   77 y.o. with history of CAD s/p CABG, ischemic cardiomyopathy, PAD s/p aortobifemoral bypass, PAF, and severe mitral regurgitation. AHF to see for A/C HFrEF.  Assessment/Plan   Acute on chronic systolic heart failure> Cardiogenic shock - Ischemic cardiomyopathy. S/p St Jude CRT-D device. TEE in 7/24 showing EF 30-35%, severe MR with restricted posterior leaflet and small flail segment in posterior leaflet, RV normal, mild AI. Recent RHC showed elevated filling pressures and low cardiac output.  - Patient is quite frail, NYHA class IV symptoms. Limited to activity around the house. Suspect he is limited by advanced heart failure/volume overload worsened by ischemic mitral regurgitation as well as significant deconditioning.  - Volume overloaded on exam, BNP almost 3K. Suspect 2/2 a fib RVR and low output HF - Start lasix 80mg  IV BID, follow diuresis - Lactic acid 3.9, will trend - May need inotropic support, difficult with arrhythmia at this time. May need transfer to CCU - Continue metoprolol  12.5 mg daily (discussed with Dr. Gasper Lloyd, will continue) - Start digoxin 0.125 tomorrow. Will give 0.25 today.  - place UNNA boots - strict I&O, daily weights  Atrial fibrillation/Atrial flutter - Paroxysmal. Has failed amiodarone (lung toxicity) as well as sotalol (break through).  - In a fib RVR, will need DCCV. Has not missed any Eliquis doses. Plan for DCCV at bedside with cardiogenic shock.  - Continue apixaban 5 mg bid. No bleeding issues.    CAD - s/p CABG in 2014. Cath in 7/24 showed patent grafts.  - No chest pain.  - HsTrop 62>64, suspect in the setting of volume overload - Continue ASA/statin  AKI  - Baseline SCr 0.8 - Up to 1.29, suspect low output HF - avoid hypotension  Elevated liver enzymes - AST 439, ALT 515 - suspect 2/2 low out  put HF  Fatigue/malaise - suspect from low output HF  Mitral regurgitation - TEE (7/24) with severe MR with restricted posterior leaflet and small flail segment in posterior leaflet.  Suspect primarily infarct-related MR.  Suspect MR is playing a significant role in his symptomatology.  - Not surgical candidate.  Followed by Dr. Excell Seltzer. Being evaluated for possible mTEER  PVCs - Continue mexiletine  PAD - S/p aortobifemoral bypass 2014 then SFA stent 2022. Followed by Dr. Chestine Spore. Had left foot ulcerations that have healed.   Hyperkalemia - suspect from ensure intake at home - no further ensure - should improve with diuresis - repeat BMET this evening   Length of Stay: 0  Alen Bleacher, NP  06/30/2023, 2:34 PM  Advanced Heart Failure Team Pager [phone] (M-F; 7a - 5p)  Please contact [facility] for night-coverage after hours (4p -7a ) and weekends on [email or web address]

## 2023-06-30 NOTE — ED Notes (Signed)
EM provider at bedside.

## 2023-06-30 NOTE — ED Notes (Addendum)
This RN assumed care of patient. Pt presented to the ED with general weakness and elevated BG that he has not been able to control from home. Pt has history of DM2 & CV disease. Pt is resting on gurney at this time, respirations are spontaneous, even, unlabored and symmetrical bilaterally. Pt skin tone is appropriate for ethnicity, dry and warm. Pt connected to CCM, pulse ox and BP. Support persons at bedside, call bell within reach and bed in lowest position.

## 2023-06-30 NOTE — Procedures (Signed)
   DIRECT CURRENT CARDIOVERSION  NAME:  [name] [name]    MRN: [id number] DOB:  June 27, 1946    ADMIT DATE: 06/29/2023  CARDIOVERSION:     Indications:  Symptomatic Atrial Fibrillation  Procedure Details:  Sedation by Dr. Jearld Fenton. The patient had the defibrillator pads placed in the anterior and posterior position. Once an appropriate level of sedation was confirmed, the patient was cardioverted x 1 with 200J of biphasic synchronized energy.  The patient converted to sinus tachycardia.  There were no apparent complications.  The patient had normal neuro status and respiratory status post procedure with vitals stable as recorded elsewhere.  Adequate airway was maintained throughout and vital signs monitored per protocol.  Madelyne Millikan Advanced Heart Failure 5:06 PM

## 2023-06-30 NOTE — ED Notes (Signed)
ED TO INPATIENT HANDOFF REPORT  ED Nurse Name and Phone #: Osvaldo Shipper RN [phone]  S Name/Age/Gender [name] [name] 77 y.o. male Room/Bed: 026C/026C  Code Status   Code Status: Full Code  Home/SNF/Other Home Patient oriented to: self, place, time, and situation Is this baseline? Yes   Triage Complete: Triage complete  Chief Complaint Acute exacerbation of CHF (congestive heart failure) (HCC) [I50.9]  Triage Note Pt reports "blood sugar all out of wack". States having difficulty sleeping and lack of appetite. No complaints of pain or any SOB. Wife and dtr with him today.    Allergies Allergies  Allergen Reactions   Lisinopril     Other reaction(s): Cough    Level of Care/Admitting Diagnosis ED Disposition     ED Disposition  Admit   Condition  --   Comment  Hospital Area: [facility] [[id number]]  Level of Care: Telemetry Cardiac [[id number]]  May place patient in observation at [facility] or [facility] if equivalent level of care is available:: Yes  Covid Evaluation: Asymptomatic - no recent exposure (last 10 days) testing not required  Diagnosis: Acute exacerbation of CHF (congestive heart failure) [facility]) [[id number]]  Admitting Physician: Lorin Glass [[id number]]  Attending Physician: Lorin Glass [[id number]]          B Medical/Surgery History Past Medical History:  Diagnosis Date   Acid reflux    AICD (automatic cardioverter/defibrillator) present    a. 08/2016 St. Jude (serial Number [id number]) biventricular ICD.   Anemia    Chronic systolic CHF (congestive heart failure) (HCC)    a. 08/2016 Echo: EF 20%, diffuse HK, inf, post AK, mod-sev MR, sev dil LA, mod TR, PASP .   COPD (chronic obstructive pulmonary disease) (HCC)    Coronary artery disease involving native coronary artery of native heart with angina pectoris (HCC)    a. 10/2012 MI after aortobifem bypass, found to have multivessel CAD -->CABG x3;  b. 08/2016 Cath: LM 60, LAD 81m, LCX  60ost/p, RCA 100p, VG->RPDA 50p, VG->OM1 ok, LIMA->LAD ok, EF 25%.   Essential hypertension 03/03/2012   History of blood transfusion 11/2012   "during his CABG"   History of gout X 1   History of stomach ulcers 1970s   Hyperlipidemia associated with type 2 diabetes mellitus (HCC) 09/16/2011   Ischemic cardiomyopathy    a. 08/2016 Echo: EF 20%, diffuse HK, inf, post AK;  b. 08/2016 St. Jude (serial Number [id number]) biventricular ICD.   Myocardial infarction [facility])    PAD (peripheral artery disease) (HCC)    s/p aortobifemoral bypass 10/2012   Peripheral neuropathy    PTSD (post-traumatic stress disorder)    Type II diabetes mellitus (HCC)    Ventricular tachycardia (HCC)    a. 08/2016 St. Jude (serial Number [id number]) biventricular ICD-->oral amio added.   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 Is now on chronic suppressive antibiotics with dicloxacillin   Past Surgical History:  Procedure Laterality Date   ABDOMINAL AORTOGRAM W/LOWER EXTREMITY Left 05/07/2021   Procedure: ABDOMINAL AORTOGRAM W/LOWER EXTREMITY;  Surgeon: [name], MD;  Location: [facility] INVASIVE CV LAB;  Service: Cardiovascular;  Laterality: Left;   AORTO-FEMORAL BYPASS GRAFT Bilateral 10/2012   [facility]   CARDIAC CATHETERIZATION  04/03/2013   [place] (? for abnormal Nuclear ST) --> no PCI, by report, patent grafts.   CARDIAC CATHETERIZATION N/A 09/02/2016   Procedure: Left  Heart Cath and Cors/Grafts Angiography;  Surgeon: Kathleene Hazel, MD;  Location: [facility] INVASIVE CV LAB:: Ost LM 60% -> mid LAD 99% subCTO, ost-prox LCx 60%; prox RCA 100% CTO w/ (small) SVG->RPDA prox 50%; SVG->OM1 ok, LIMA->mLAD ok, EF 25% - complicated by VT during access - Shock x 2, Lidocaine & Amiodarone   CARDIAC CATHETERIZATION  11/09/2012   [facility]: due to Sustained VT post-Ob Ao-BiFem Bypass. (no Angina)    CHOLECYSTECTOMY  03/05/2012   Procedure: LAPAROSCOPIC CHOLECYSTECTOMY WITH INTRAOPERATIVE CHOLANGIOGRAM;  Surgeon: Emelia Loron, MD;  Location[facility] OR;  Service: General;  Laterality: N/A;   CORONARY ARTERY BYPASS GRAFT  11/2012   [place] [facility]: CABG X 3 -> LIMA-mLAD, SVG-highOM1, SVG-RPDA (dRCA).   ENDARTERECTOMY FEMORAL Left 05/13/2021   Procedure: Left Femoral Endarterectomy WITH BOVINE PATCH PROFUNDOPLASTY, REVISION OF LEFT LIMB OF AORTO-BIFEMORAL GRAFT WITH DACRON GRAFT;  Surgeon: Cephus Shelling, MD;  Location: [facility] OR;  Service: Vascular;  Laterality: Left;   EP IMPLANTABLE DEVICE N/A 09/03/2016   Procedure: BI-VENTRICULAR IMPLANTABLE CARDIOVERTER DEFIBRILLATOR  (CRT-D);  Surgeon: Marinus Maw, MD;  Location: [facility] INVASIVE CV LAB;  Service: Cardiovascular;  Laterality: N/A;   INGUINAL HERNIA REPAIR Left 1990s   INSERTION OF ILIAC STENT Left 05/13/2021   Procedure: INSERTION OF SUPERFICIAL FEMORAL ARTERY STENT TIMES THREE AND ANGIOPLASTY;  Surgeon: Cephus Shelling, MD;  Location: [facility] OR;  Service: Vascular;  Laterality: Left;   INTRAOPERATIVE ARTERIOGRAM Left 05/13/2021   Procedure: INTRA OPERATIVE ARTERIOGRAM OF LEFT LOWER EXTREMITY;  Surgeon: Cephus Shelling, MD;  Location: [facility] OR;  Service: Vascular;  Laterality: Left;   NM MYOVIEW LTD  10/05/2022   EF ~28%.  Severely reduced function.  High risk due to decreased EF.  Large fixed severe defect in the mid to basal inferior and inferolateral wall with associated wall motion abnormality consistent with prior infarction.  No evidence of reversible defect/ischemia. =? Echo EF 40-45% with mid to apical anterior/inferolateral akinesis and inferior akinesis.   RIGHT/LEFT HEART CATH AND CORONARY/GRAFT ANGIOGRAPHY N/A 03/25/2023   Procedure: RIGHT/LEFT HEART CATH AND CORONARY/GRAFT ANGIOGRAPHY;  Surgeon: Marykay Lex, MD;  [facility] INVASIVE CV LAB;  Service: Cardiovascular;  Laterality: N/A;   STERNOTOMY  09/2013   [facility]):  Sternal Wound Infection -> re-do sternotomy with metal place "sheild" placed. -Has chronic staph infection, on chronic suppressive medication   TEE WITHOUT CARDIOVERSION N/A 03/25/2023   Procedure: TRANSESOPHAGEAL ECHOCARDIOGRAM;  Surgeon: Christell Constant, MD;  [facility] INVAS[facility] CV LAB;  Service: Cardiovascular;  Laterality: N/A;   TRANSTHORACIC ECHOCARDIOGRAM  10/05/2022   EF 40 to 45%.  Basal to mid anterolateral and inferolateral akinesis with basal inferior akinesis.  GR 2 DD.  Severely elevated PAP with mild RV dilation.  RV P estimated 67 mmHg).  Severe LA dilation.  Mild to moderate RA dilation.  Mild to moderate TR.  Severe MR likely with restricted posterior leaflet and inferolateral wall motion.  Moderate AOV calcification with no stenosis.  Mild AI.   TRANSTHORACIC ECHOCARDIOGRAM  01/2021   EF 25 to 30%.  Severely depressed EF.  Global HK with worst basal to mid anterolateral and inferolateral as well as anterior basal inferior walls.  GR 1 DD.  Mildly reduced RV function.  Functionally bicuspid aortic valve with moderate calcification but no stenosis.  Overall slightly improved EF.   UMBILICAL HERNIA REPAIR  03/05/2012   Procedure: HERNIA REPAIR UMBILICAL ADULT;  Surgeon: Emelia Loron, MD;  Location: [facility] OR;  Service: General;  Laterality: N/A;     A IV Location/Drains/Wounds Patient Lines/Drains/Airways Status     Active Line/Drains/Airways     Name Placement date Placement time Site Days   Incision - 4 Ports Abdomen Umbilicus Mid;Upper Right;Upper Right;Lower 03/05/12  0808  -- 4134   Pressure Injury 02/17/23 Coccyx Right;Left Stage 2 -  Partial thickness loss of dermis presenting as a shallow open injury with a red, pink wound bed without slough. 02/17/23  1730  -- 133   Wound / Incision (Open or Dehisced) 05/13/21 Venous stasis ulcer Foot Anterior;Left 05/13/21  1820  Foot  778            Intake/Output Last 24 hours No intake or output data in the 24 hours  ending 06/30/23 1155  Labs/Imaging Results for orders placed or performed during the hospital encounter of 06/29/23 (from the past 48 hour(s))  CBG monitoring, ED     Status: Abnormal   Collection Time: 06/29/23 11:25 PM  Result Value Ref Range   Glucose-Capillary 210 (H) 70 - 99 mg/dL    Comment: Glucose reference range applies only to samples taken after fasting for at least 8 hours.  CBC     Status: Abnormal   Collection Time: 06/29/23 11:38 PM  Result Value Ref Range   WBC 9.6 4.0 - 10.5 K/uL   RBC 4.77 4.22 - 5.81 MIL/uL   Hemoglobin 12.6 (L) 13.0 - 17.0 g/dL   HCT 16.1 (L) 09.6 - 04.5 %   MCV 81.3 80.0 - 100.0 fL   MCH 26.4 26.0 - 34.0 pg   MCHC 32.5 30.0 - 36.0 g/dL   RDW 40.9 (H) 81.1 - 91.4 %   Platelets 284 150 - 400 K/uL   nRBC 0.4 (H) 0.0 - 0.2 %    Comment: Performed at [facility] Lab, [address] [place], [place] [address]  Comprehensive metabolic panel     Status: Abnormal   Collection Time: 06/29/23 11:38 PM  Result Value Ref Range   Sodium 126 (L) 135 - 145 mmol/L   Potassium 5.3 (H) 3.5 - 5.1 mmol/L   Chloride 90 (L) 98 - 111 mmol/L   CO2 21 (L) 22 - 32 mmol/L   Glucose, Bld 240 (H) 70 - 99 mg/dL    Comment: Glucose reference range applies only to samples taken after fasting for at least 8 hours.   BUN 40 (H) 8 - 23 mg/dL   Creatinine, Ser 6.21 (H) 0.61 - 1.24 mg/dL   Calcium 9.0 8.9 - 30.8 mg/dL   Total Protein 6.7 6.5 - 8.1 g/dL   Albumin 3.8 3.5 - 5.0 g/dL   AST 657 (H) 15 - 41 U/L   ALT 286 (H) 0 - 44 U/L   Alkaline Phosphatase 91 38 - 126 U/L   Total Bilirubin 0.9 0.3 - 1.2 mg/dL   GFR, Estimated 59 (L) >60 mL/min    Comment: (NOTE) Calculated using the CKD-EPI Creatinine Equation (2021)    Anion gap 15 5 - 15    Comment: Performed at [facility] Lab, [address], [place], [place] [address]  CBG monitoring, ED     Status: Abnormal   Collection Time: 06/30/23  7:50 AM  Result Value Ref Range   Glucose-Capillary 194 (H) 70 - 99  mg/dL    Comment: Glucose reference range applies only to samples taken after fasting for at least 8 hours.  Resp panel by RT-PCR (RSV, Flu A&B, Covid) Anterior Nasal Swab  Status: None   Collection Time: 06/30/23  8:08 AM   Specimen: Anterior Nasal Swab  Result Value Ref Range   SARS Coronavirus 2 by RT PCR NEGATIVE NEGATIVE   Influenza A by PCR NEGATIVE NEGATIVE   Influenza B by PCR NEGATIVE NEGATIVE    Comment: (NOTE) The Xpert Xpress SARS-CoV-2/FLU/RSV plus assay is intended as an aid in the diagnosis of influenza from Nasopharyngeal swab specimens and should not be used as a sole basis for treatment. Nasal washings and aspirates are unacceptable for Xpert Xpress SARS-CoV-2/FLU/RSV testing.  Fact Sheet for Patients: BloggerCourse.com  Fact Sheet for Healthcare Providers: SeriousBroker.it  This test is not yet approved or cleared by the [place] FDA and has been authorized for detection and/or diagnosis of SARS-CoV-2 by FDA under an Emergency Use Authorization (EUA). This EUA will remain in effect (meaning this test can be used) for the duration of the COVID-19 declaration under Section 564(b)(1) of the Act, 21 U.S.C. section 360bbb-3(b)(1), unless the authorization is terminated or revoked.     Resp Syncytial Virus by PCR NEGATIVE NEGATIVE    Comment: (NOTE) Fact Sheet for Patients: BloggerCourse.com  Fact Sheet for Healthcare Providers: SeriousBroker.it  This test is not yet approved or cleared by the [place] FDA and has been authorized for detection and/or diagnosis of SARS-CoV-2 by FDA under an Emergency Use Authorization (EUA). This EUA will remain in effect (meaning this test can be used) for the duration of the COVID-19 declaration under Section 564(b)(1) of the Act, 21 U.S.C. section 360bbb-3(b)(1), unless the authorization is terminated  or revoked.  Performed at [facility], [address] [place], [place] [address]   Urinalysis, Routine w reflex microscopic -Urine, Clean Catch     Status: Abnormal   Collection Time: 06/30/23  9:47 AM  Result Value Ref Range   Color, Urine YELLOW YELLOW   APPearance CLEAR CLEAR   Specific Gravity, Urine 1.021 1.005 - 1.030   pH 5.0 5.0 - 8.0   Glucose, UA >=500 (A) NEGATIVE mg/dL   Hgb urine dipstick NEGATIVE NEGATIVE   Bilirubin Urine NEGATIVE NEGATIVE   Ketones, ur 5 (A) NEGATIVE mg/dL   Protein, ur 30 (A) NEGATIVE mg/dL   Nitrite NEGATIVE NEGATIVE   Leukocytes,Ua NEGATIVE NEGATIVE   RBC / HPF 0-5 0 - 5 RBC/hpf   WBC, UA 0-5 0 - 5 WBC/hpf   Bacteria, UA NONE SEEN NONE SEEN   Squamous Epithelial / HPF 0-5 0 - 5 /HPF    Comment: Performed at [facility], [address], [place], [place] [address]  Protime-INR     Status: Abnormal   Collection Time: 06/30/23  9:47 AM  Result Value Ref Range   Prothrombin Time 22.5 (H) 11.4 - 15.2 seconds   INR 2.0 (H) 0.8 - 1.2    Comment: (NOTE) INR goal varies based on device and disease states. Performed at [facility], [address], [place], [place] [address]   Digoxin level     Status: None   Collection Time: 06/30/23  9:47 AM  Result Value Ref Range   Digoxin Level 0.9 0.8 - 2.0 ng/mL    Comment: Performed at [facility], [address] [place], [place] [address]  Troponin I (High Sensitivity)     Status: Abnormal   Collection Time: 06/30/23  9:47 AM  Result Value Ref Range   Troponin I (High Sensitivity) 62 (H) <18 ng/L    Comment: (NOTE) Elevated high  sensitivity troponin I (hsTnI) values and significant  changes across serial measurements may suggest ACS but many other  chronic and acute conditions are known to elevate hsTnI results.  Refer to the "Links" section for chest pain algorithms and additional  guidance. Performed at [facility], [address] [place], [place] [address]    Brain natriuretic peptide     Status: Abnormal   Collection Time: 06/30/23  9:47 AM  Result Value Ref Range   B Natriuretic Peptide 2,925.9 (H) 0.0 - 100.0 pg/mL    Comment: Performed at [facility], [address], [place], [place] [address]   DG Chest 2 View  Result Date: 06/30/2023 CLINICAL DATA:  sob EXAM: CHEST - 2 VIEW COMPARISON:  02/17/2023. FINDINGS: Mild pulmonary vascular congestion. Bilateral lung fields are otherwise clear. No dense consolidation or lung collapse. No pneumothorax. Bilateral costophrenic angles are clear. Stable cardio-mediastinal silhouette. There is a left sided 3-lead pacemaker. Redemonstration of multiple sternal/anterior rib ORIF hardware is. No acute osseous abnormalities. The soft tissues are within normal limits. IMPRESSION: 1. Mild pulmonary vascular congestion. Electronically Signed   By: Jules Schick M.D.   On: 06/30/2023 09:33    Pending Labs Unresulted Labs (From admission, onward)     Start     Ordered   07/01/23 0500  Basic metabolic panel  Tomorrow morning,   R        06/30/23 1154   07/01/23 0500  CBC  Tomorrow morning,   R        06/30/23 1154   07/01/23 0500  Magnesium  Tomorrow morning,   R        06/30/23 1154   06/30/23 1152  Comprehensive metabolic panel  Add-on,   AD        06/30/23 1151            Vitals/Pain Today's Vitals   06/30/23 0054 06/30/23 0604 06/30/23 0836 06/30/23 1150  BP: 103/76 (!) 124/91 (!) 121/96 (!) 115/90  Pulse: (!) 51 (!) 34 (!) 113 (!) 49  Resp: 18 17 18 12   Temp:   (!) 96.5 F (35.8 C)   TempSrc:   Axillary   SpO2: 100% (!) 83% 95% (!) 84%  PainSc:        Isolation Precautions No active isolations  Medications Medications  insulin aspart (novoLOG) injection 0-9 Units (has no administration in time range)  insulin aspart (novoLOG) injection 0-5 Units (has no administration in time range)  acetaminophen (TYLENOL) tablet 650 mg (has no administration in time range)    Or   acetaminophen (TYLENOL) suppository 650 mg (has no administration in time range)  albuterol (PROVENTIL) (2.5 MG/3ML) 0.083% nebulizer solution 2.5 mg (has no administration in time range)  hydrALAZINE (APRESOLINE) injection 10 mg (has no administration in time range)    Mobility non-ambulatory     Focused Assessments Cardiac Assessment Handoff:  Cardiac Rhythm: Sinus tachycardia Lab Results  Component Value Date   TROPONINI 0.07 (HH) 09/02/2016   No results found for: "DDIMER" Does the Patient currently have chest pain? No    R Recommendations: See Admitting Provider Note  Report given to:   Additional Notes: Acute excerebration CHF

## 2023-06-30 NOTE — Progress Notes (Signed)
Orthopedic Tech Progress Note Patient Details:  [name] [name] 01-09-1946 [id number]  Ortho Devices Type of Ortho Device: Radio broadcast assistant Ortho Device/Splint Location: BLE Ortho Device/Splint Interventions: Ordered, Application, Adjustment   Post Interventions Patient Tolerated: Well Instructions Provided: Care of device  [name] 06/30/2023, 7:25 PM

## 2023-06-30 NOTE — ED Provider Notes (Signed)
[place] EMERGENCY DEPARTMENT AT [facility] Provider Note   CSN: [id number] Arrival date & time: 06/29/23  2305     History  Chief Complaint  Patient presents with   Hyperglycemia    [name] [name] is a 77 y.o. male.  [name] [name] is a 77 yo M with PMH CAD, PAD, T2DM, Afib on eliquis, and COPD not requiring oxygen presenting with 1 week of general malaise with specific concerns of hyperglycemia, dyspnea, and weakness. His wife and daughter are at his bedside and contribute to history. He started feeling poorly several days ago without any definable cause. He has very low appetite and is currently only consuming Ensure drinks but denies abdominal pain, nausea, vomiting. He is using a walker and reports increased weakness and dyspnea when he moves. He reports that he cannot sleep well because of dyspnea and notes nasal congestion. He does not report dyspnea at time of evaluation. He is tachycardia but does not report chest pain or palpitations. He further denies abdominal pain, diarrhea, vomiting, headaches.   Hyperglycemia Associated symptoms: fatigue and shortness of breath   Associated symptoms: no abdominal pain, no chest pain, no fever, no nausea and no vomiting       Home Medications Prior to Admission medications   Medication Sig Start Date End Date Taking? Authorizing Provider  acetaminophen (TYLENOL) 650 MG CR tablet Take 650 mg by mouth every 8 (eight) hours as needed for pain.    [provider]  albuterol (VENTOLIN HFA) 108 (90 Base) MCG/ACT inhaler Inhale 1-2 puffs into the lungs every 6 (six) hours as needed (COPD).    [provider]  Alpha-Lipoic Acid 600 MG TABS Take 600 mg by mouth daily.    [provider]  apixaban (ELIQUIS) 5 MG TABS tablet Take 1 tablet (5 mg total) by mouth 2 (two) times daily. 02/18/23   Camnitz, Andree Coss, MD  aspirin EC 81 MG tablet Take 81 mg by mouth daily.    [provider]  atorvastatin  (LIPITOR) 80 MG tablet Take 40 mg by mouth every evening. 04/14/20   [provider]  bacitracin ointment Apply 1 Application topically 2 (two) times daily as needed for wound care.    [provider]  cholecalciferol (VITAMIN D) 25 MCG (1000 UNIT) tablet Take 2,000 Units by mouth every evening.    [provider]  dicloxacillin (DYNAPEN) 250 MG capsule Take 2 capsules (500 mg total) by mouth 3 (three) times daily. Patient taking differently: Take 500 mg by mouth 2 (two) times daily. 01/25/23   Tonny Bollman, MD  digoxin (LANOXIN) 0.125 MG tablet Take 1 tablet (0.125 mg total) by mouth daily. Monday through Friday only, None on Saturday and Sunday 06/13/23   Jacklynn Ganong, FNP  diphenhydrAMINE (BENADRYL) 25 mg capsule Take 25 mg by mouth every 6 (six) hours as needed for sleep.    [provider]  empagliflozin (JARDIANCE) 25 MG TABS tablet Take .5mg  Half tablet daily Patient taking differently: Take 0.5mg  Half tablet daily 04/12/23   Laurey Morale, MD  fexofenadine (ALLEGRA) 180 MG tablet Take 180 mg by mouth every evening.    [provider]  fluticasone (FLONASE) 50 MCG/ACT nasal spray Place 2 sprays into both nostrils daily as needed for allergies or rhinitis.    [provider]  furosemide (LASIX) 20 MG tablet Take 1 tablet (20 mg total) by mouth daily. 05/02/23   Laurey Morale, MD  glipiZIDE (GLUCOTROL) 5  MG tablet Take 5 mg by mouth 2 (two) times daily before a meal.    [provider]  lidocaine (LIDODERM) 5 % Place 1 patch onto the skin daily as needed (For pain). 10/14/22   [provider]  magnesium oxide (MAG-OX) 400 (241.3 Mg) MG tablet Take 1 tablet (400 mg total) by mouth daily. 09/04/16   Creig Hines, NP  metFORMIN (GLUCOPHAGE) 1000 MG tablet Take 1 tablet (1,000 mg total) by mouth 2 (two) times daily with a meal. Resume on 09/06/2016 09/04/16   Creig Hines, NP  metoprolol  succinate (TOPROL XL) 25 MG 24 hr tablet Take 0.5 tablets (12.5 mg total) by mouth in the morning and at bedtime. 06/27/23   Marinus Maw, MD  mexiletine (MEXITIL) 150 MG capsule Take 1 capsule (150 mg total) by mouth every 12 (twelve) hours. 02/18/23   Camnitz, Andree Coss, MD  nitroGLYCERIN (NITROSTAT) 0.4 MG SL tablet Place 1 tablet (0.4 mg total) under the tongue every 5 (five) minutes x 3 doses as needed for chest pain. 09/04/16   Creig Hines, NP  Nutritional Supplements (ENSURE PLUS PO) Take 1 Can by mouth 2 (two) times daily.    [provider]  OVER THE COUNTER MEDICATION Take 1 tablet by mouth See admin instructions. Relaxium every other night as needed    [provider]  pantoprazole (PROTONIX) 40 MG tablet Take 1 tablet (40 mg total) by mouth daily. 05/22/21   Marykay Lex, MD  spironolactone (ALDACTONE) 25 MG tablet Take 0.5 tablets (12.5 mg total) by mouth daily. 05/02/23   Laurey Morale, MD  Tiotropium Bromide Monohydrate (SPIRIVA RESPIMAT) 2.5 MCG/ACT AERS Inhale 2 puffs into the lungs at bedtime.    [provider]  UNABLE TO FIND Med Name: Azelastine 137 mcg/ spray 2 times in each nostril daily.    [provider]  Vitamins A & D (VITAMIN A & D) ointment Apply 1 Application topically as needed for dry skin (on bottoms).    [provider]      Allergies    Lisinopril    Review of Systems   Review of Systems  Constitutional:  Positive for fatigue. Negative for chills and fever.  HENT:  Positive for congestion.   Respiratory:  Positive for cough and shortness of breath. Negative for chest tightness and wheezing.   Cardiovascular:  Negative for chest pain and palpitations.  Gastrointestinal:  Negative for abdominal distention, abdominal pain, diarrhea, nausea and vomiting.  Genitourinary:  Negative for frequency.  Musculoskeletal:  Negative for back pain.  Neurological:  Negative for seizures, light-headedness,  numbness and headaches.    Physical Exam Updated Vital Signs BP (!) 115/90   Pulse (!) 49   Temp (!) 96.5 F (35.8 C) (Axillary)   Resp 12   SpO2 (!) 84%  Physical Exam Constitutional:      General: He is not in acute distress.    Appearance: Normal appearance. He is not ill-appearing.  HENT:     Head: Normocephalic and atraumatic.     Nose: Nose normal. No rhinorrhea.  Cardiovascular:     Rate and Rhythm: Normal rate and regular rhythm.     Heart sounds: Normal heart sounds.     Comments: LEs are cool and pulses diminished Pulmonary:     Effort: Pulmonary effort is normal. No respiratory distress.     Breath sounds: Normal breath sounds. No stridor. No wheezing, rhonchi or rales.  Abdominal:  General: Abdomen is flat. There is no distension.     Palpations: Abdomen is soft.     Tenderness: There is no abdominal tenderness. There is no guarding.  Musculoskeletal:        General: No swelling.     Right lower leg: No edema.     Left lower leg: No edema.  Skin:    Capillary Refill: Capillary refill takes less than 2 seconds.  Neurological:     General: No focal deficit present.     Mental Status: He is alert and oriented to person, place, and time.  Psychiatric:        Mood and Affect: Mood normal.        Behavior: Behavior normal.     ED Results / Procedures / Treatments   Labs (all labs ordered are listed, but only abnormal results are displayed) Labs Reviewed  CBC - Abnormal; Notable for the following components:      Result Value   Hemoglobin 12.6 (*)    HCT 38.8 (*)    RDW 15.6 (*)    nRBC 0.4 (*)    All other components within normal limits  URINALYSIS, ROUTINE W REFLEX MICROSCOPIC - Abnormal; Notable for the following components:   Glucose, UA >=500 (*)    Ketones, ur 5 (*)    Protein, ur 30 (*)    All other components within normal limits  COMPREHENSIVE METABOLIC PANEL - Abnormal; Notable for the following components:   Sodium 126 (*)    Potassium  5.3 (*)    Chloride 90 (*)    CO2 21 (*)    Glucose, Bld 240 (*)    BUN 40 (*)    Creatinine, Ser 1.26 (*)    AST 250 (*)    ALT 286 (*)    GFR, Estimated 59 (*)    All other components within normal limits  PROTIME-INR - Abnormal; Notable for the following components:   Prothrombin Time 22.5 (*)    INR 2.0 (*)    All other components within normal limits  BRAIN NATRIURETIC PEPTIDE - Abnormal; Notable for the following components:   B Natriuretic Peptide 2,925.9 (*)    All other components within normal limits  CBG MONITORING, ED - Abnormal; Notable for the following components:   Glucose-Capillary 210 (*)    All other components within normal limits  CBG MONITORING, ED - Abnormal; Notable for the following components:   Glucose-Capillary 194 (*)    All other components within normal limits  CBG MONITORING, ED - Abnormal; Notable for the following components:   Glucose-Capillary 201 (*)    All other components within normal limits  TROPONIN I (HIGH SENSITIVITY) - Abnormal; Notable for the following components:   Troponin I (High Sensitivity) 62 (*)    All other components within normal limits  RESP PANEL BY RT-PCR (RSV, FLU A&B, COVID)  RVPGX2  DIGOXIN LEVEL  COMPREHENSIVE METABOLIC PANEL  TROPONIN I (HIGH SENSITIVITY)    EKG None  Radiology DG Chest 2 View  Result Date: 06/30/2023 CLINICAL DATA:  sob EXAM: CHEST - 2 VIEW COMPARISON:  02/17/2023. FINDINGS: Mild pulmonary vascular congestion. Bilateral lung fields are otherwise clear. No dense consolidation or lung collapse. No pneumothorax. Bilateral costophrenic angles are clear. Stable cardio-mediastinal silhouette. There is a left sided 3-lead pacemaker. Redemonstration of multiple sternal/anterior rib ORIF hardware is. No acute osseous abnormalities. The soft tissues are within normal limits. IMPRESSION: 1. Mild pulmonary vascular congestion. Electronically Signed   By: Rhea Belton  Ramiro Harvest M.D.   On: 06/30/2023 09:33     Procedures Procedures    Medications Ordered in ED Medications  insulin aspart (novoLOG) injection 0-9 Units (3 Units Subcutaneous Given 06/30/23 1209)  insulin aspart (novoLOG) injection 0-5 Units (has no administration in time range)  acetaminophen (TYLENOL) tablet 650 mg (has no administration in time range)    Or  acetaminophen (TYLENOL) suppository 650 mg (has no administration in time range)  albuterol (PROVENTIL) (2.5 MG/3ML) 0.083% nebulizer solution 2.5 mg (has no administration in time range)  hydrALAZINE (APRESOLINE) injection 10 mg (has no administration in time range)    ED Course/ Medical Decision Making/ A&P Clinical Course as of 06/30/23 1210  Thu Jun 30, 2023  1032 DG Chest 2 View [CJ]    Clinical Course User Index [CJ] Katheran James, DO                                 Medical Decision Making Pt originally complaining of hyperglycemia and malaise - ultimately with dyspnea on exertion and fatigue as primary symptoms. These are progressing over the last two weeks. He has also had low appetite in that time. Heart history of recent EF 35%, CAD/CABG, mitral regurgitation, Afib on Eliquis, ICD, PAD. Vital signs reveal tachycardia and O2 desaturations with movement but stable on ra when at rest. Exam is subtle but some suggestions of volume overload with JVD and some LE edema, though mild, and lungs have rare rales. Diff diagnosis includes ACS, CHF, PE, Aifb RVR, infection, anemia. On workup, CXR suggests mild bilateral infiltrates and his BNP is 2900. He also has AKI, transaminitis, and hyponatremia on his chemistries in setting of poor recent po intake. UA not infectious, no leukocytosis, only very mild anemia. Although he is not requiring oxygen (no respiratory distress), he does become dyspneic with desaturations with slight movement and he is generally uncomfortable - favor admission for acute on chronic HFrEF, mitral regurgitation as well as AKI and hyponatremia.  Heart failure team has been consulted as well.  Amount and/or Complexity of Data Reviewed Labs: ordered. Radiology: ordered. Decision-making details documented in ED Course.  Risk Decision regarding hospitalization.          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

## 2023-06-30 NOTE — ED Notes (Signed)
Patient transferred to hospital bed for comfort

## 2023-06-30 NOTE — ED Notes (Signed)
Pace maker by [employer]. Jude has been interrogated.

## 2023-06-30 NOTE — ED Provider Notes (Signed)
Called to assist with sedation for cardioversion by Dr. Gasper Lloyd.  Briefly, patient is a 77 y.o. male with PMH significant for DM2, HTN, HLD, CAD/MI, ischemic cardiomyopathy, chronic systolic CHF, V. Tach s/p BiV-ICD, severe MR, PAD s/p bypass, stent GERD, stomach ulcers, chronic anemia, COPD, gout,, peripheral neuropathy, PTSD who presented to ED today with SOB and generalized weakness, mild weight gain. WBC 9.6, Hgb 12.6, glucose 240, Na 126, K 4.3, BUN/Cr 40/1.26, transaminitis, trop 62-->64. CXR showed pulm vascular congestion. Found to be in HF exacerbation. Treated w/ Lasix. While admitted pending bed availability in the ED patient acutely went into Afib w/ RVR. Heart failure Dr. Gasper Lloyd requested my assistance with sedation for cardioversion.  Patient last oral intake several days ago. Has had general anesthesia before with no issues. Currently satting 100% on 2L [place] that he was given for dyspnea. No resp distress. A&Ox4.    Patient was pre-treated w/ zofran and sedated with 8mg  IV etomidate. Phenylephrine was at bedside PRN for hypotension. Pt was cardioverted by Dr. Gasper Lloyd with return to sinus tachycardia. No intraprocedure events including hypoxia or hypotension.    .Sedation  Date/Time: 06/30/2023 3:51 PM  Performed by: Loetta Rough, MD Authorized by: Loetta Rough, MD   Consent:    Consent obtained:  Verbal   Consent given by:  Patient   Risks discussed:  Allergic reaction, dysrhythmia, inadequate sedation, nausea, vomiting, respiratory compromise necessitating ventilatory assistance and intubation, prolonged sedation necessitating reversal and prolonged hypoxia resulting in organ damage   Alternatives discussed:  Analgesia without sedation Universal protocol:    Procedure explained and questions answered to patient or proxy's satisfaction: yes     Test results available: yes     Imaging studies available: yes     Immediately prior to procedure, a time out was called:  yes     Patient identity confirmed:  Verbally with patient Indications:    Procedure performed:  Cardioversion   Procedure necessitating sedation performed by:  Different physician Pre-sedation assessment:    Time since last food or drink:  Several days   ASA classification: class 4 - patient with severe systemic disease that is a constant threat to life     Mouth opening:  3 or more finger widths   Thyromental distance:  2 finger widths   Mallampati score:  III - soft palate, base of uvula visible   Neck mobility: normal     Pre-sedation assessments completed and reviewed: cardiovascular function     Pre-sedation assessments completed and reviewed: pre-procedure airway patency not reviewed, pre-procedure hydration status not reviewed, pre-procedure mental status not reviewed, pre-procedure nausea and vomiting status not reviewed, pre-procedure pain level not reviewed, pre-procedure respiratory function not reviewed and pre-procedure temperature not reviewed   Immediate pre-procedure details:    Reassessment: Patient reassessed immediately prior to procedure     Reviewed: vital signs, relevant labs/tests and NPO status     Verified: bag valve mask available, emergency equipment available, intubation equipment available, IV patency confirmed and oxygen available   Procedure details (see MAR for exact dosages):    Preoxygenation:  Nasal cannula   Sedation:  Etomidate   Intended level of sedation: deep and moderate (conscious sedation)   Analgesia:  None   Intra-procedure monitoring:  Continuous pulse oximetry, continuous capnometry, blood pressure monitoring, frequent vital sign checks, frequent LOC assessments and cardiac monitor   Intra-procedure events: none     Total Provider sedation time (minutes):  12 Post-procedure details:  Attendance: Constant attendance by certified staff until patient recovered     Recovery: Patient returned to pre-procedure baseline     Post-sedation  assessments completed and reviewed: cardiovascular function     Post-sedation assessments completed and reviewed: post-procedure airway patency not reviewed, post-procedure hydration status not reviewed, post-procedure mental status not reviewed, post-procedure nausea and vomiting status not reviewed, pain score not reviewed, post-procedure respiratory function not reviewed and post-procedure temperature not reviewed     Patient is stable for discharge or admission: yes     Procedure completion:  Tolerated well, no immediate complications       Loetta Rough, MD 06/30/23 1655

## 2023-06-30 NOTE — ED Notes (Addendum)
Received patient in yellow zone from [place], RN (blue zone). Patient placed on continuous cardiac monitoring upon arrival and found to have HR 130s-150s; repeat EKG obtained. Patient denies any symptoms or pain. ED resident, Dr. Ninfa Meeker, made aware via face-to-face conversation.

## 2023-06-30 NOTE — Sedation Documentation (Signed)
200J delivered to patient at this time.

## 2023-06-30 NOTE — H&P (Signed)
[facility] History and Physical  [name] [name] HQI:[id number] DOB: 06/17/1946 DOA: 06/29/2023 PCP: [facility]  Presented from: Home Chief Complaint: Worsening shortness of breath for 2 to 3 weeks  History of Present Illness: [name] [name] is a 77 y.o. male with PMH significant for DM2, HTN, HLD, CAD/MI, ischemic cardiomyopathy, chronic systolic CHF, V. tach, s/p BiV-ICD, severe MR, PAD s/p bypass, stent GERD, stomach ulcers, chronic anemia, COPD, gout,, peripheral neuropathy, PTSD who has limitation in mobility due to severe PAD.  He does not walk much except around the house and uses electric wheelchair.  Follows with vascular surgery.  He has PAD with h/o aortobifemoral bypass done at the [place] in 2014. Most recently on 05/13/2021 he required left common femoral and SFA endarterectomy, revision of aortobifem left limb, and left SFA stent placement by Dr. Chestine Spore.  Last seen here in vascular surgery office on 9/24. Patient follows up with cardiologists Dr. Excell Seltzer, Dr. Shirlee Latch, Dr. Herbie Baltimore and is currently in preop workup for mTEER for severe MR.  For the last 2 to 3 weeks, patient reports progressively worsening shortness of breath.  Does not use oxygen at home.  States "I can't eat, I can't sleep, or get out of bed without getting short of breath.' Chart reviewed.   10/7, family reached out to CHF team with symptoms.  CHF team monitored his thoracic impedance and had noted normal fluid levels.  He was recommended to stop carvedilol and switch to Toprol 12.5 mg twice daily.  Patient is also been taking extra dose of Lasix last 2 days on top of Lasix 20 mg daily.  In the ED, patient was afebrile, heart rate variable between 30s to 110s, blood pressure in 120s, O2 sat in 80s on room air. Initial labs last night with WBC 9.6, hemoglobin 12.6, glucose 240, sodium 126, potassium 5.3, BUN/creatinine 40/1.26, AST/ALT 250/286 It seems patient had to wait several hours overnight to be  seen ultimately this morning by the ED physician.  Chest x-ray this morning showed mild pulm vascular congestion BNP was close to 3000, troponin elevated to 62. Given significant history of CHF, EDP discussed with heart failure service. Hospitalist service was consulted for inpatient management.  At the time of my evaluation, patient was lying in bed.  Wife at bedside. Alert, awake, oriented x 3.  Frustrated with persistent symptoms. Not on supplemental oxygen. Both legs are cold up to the thigh without any tenderness.  Difficult to palpate the dorsalis pedal pulses.. History reviewed and detailed as above.  Review of Systems:  All systems were reviewed and were negative unless otherwise mentioned in the HPI   Past medical history: Past Medical History:  Diagnosis Date   Acid reflux    AICD (automatic cardioverter/defibrillator) present    a. 08/2016 St. Jude (serial Number [id number]) biventricular ICD.   Anemia    Chronic systolic CHF (congestive heart failure) (HCC)    a. 08/2016 Echo: EF 20%, diffuse HK, inf, post AK, mod-sev MR, sev dil LA, mod TR, PASP .   COPD (chronic obstructive pulmonary disease) (HCC)    Coronary artery disease involving native coronary artery of native heart with angina pectoris (HCC)    a. 10/2012 MI after aortobifem bypass, found to have multivessel CAD -->CABG x3;  b. 08/2016 Cath: LM 60, LAD 62m, LCX 60ost/p, RCA 100p, VG->RPDA 50p, VG->OM1 ok, LIMA->LAD ok, EF 25%.   Essential hypertension 03/03/2012   History of blood transfusion 11/2012   "during his CABG"  History of gout X 1   History of stomach ulcers 1970s   Hyperlipidemia associated with type 2 diabetes mellitus (HCC) 09/16/2011   Ischemic cardiomyopathy    a. 08/2016 Echo: EF 20%, diffuse HK, inf, post AK;  b. 08/2016 St. Jude (serial Number [id number]) biventricular ICD.   Myocardial infarction [facility])    PAD (peripheral artery disease) (HCC)    s/p aortobifemoral bypass 10/2012    Peripheral neuropathy    PTSD (post-traumatic stress disorder)    Type II diabetes mellitus (HCC)    Ventricular tachycardia (HCC)    a. 08/2016 St. Jude (serial Number [id number]) biventricular ICD-->oral amio added.   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 Is now on chronic suppressive antibiotics with dicloxacillin    Past surgical history: Past Surgical History:  Procedure Laterality Date   ABDOMINAL AORTOGRAM W/LOWER EXTREMITY Left 05/07/2021   Procedure: ABDOMINAL AORTOGRAM W/LOWER EXTREMITY;  Surgeon: [name], MD;  Location: [facility] INVASIVE CV LAB;  Service: Cardiovascular;  Laterality: Left;   AORTO-FEMORAL BYPASS GRAFT Bilateral 10/2012   [facility]   CARDIAC CATHETERIZATION  04/03/2013   [facility] [place] (? for abnormal Nuclear ST) --> no PCI, by report, patent grafts.   CARDIAC CATHETERIZATION N/A 09/02/2016   Procedure: Left Heart Cath and Cors/Grafts Angiography;  Surgeon: Kathleene Hazel, MD;  Location: [facility] IN[facility]:: Ost LM 60% -> mid LAD 99% subCTO, ost-prox LCx 60%; prox RCA 100% CTO w/ (small) SVG->RPDA prox 50%; SVG->OM1 ok, LIMA->mLAD ok, EF 25% - complicated by VT during access - Shock x 2, Lidocaine & Amiodarone   CARDIAC CATHETERIZATION  11/09/2012   [facility]: due to Sustained VT post-Ob Ao-BiFem Bypass. (no Angina)   CHOLECYSTECTOMY  03/05/2012   Procedure: LAPAROSCOPIC CHOLECYSTECTOMY WITH INTRAOPERATIVE CHOLANGIOGRAM;  Surgeon: Emelia Loron, MD;  Location: [facility] OR;  Service: General;  Laterality: N/A;   CORONARY ARTERY BYPASS GRAFT  11/2012    [facility]: CABG X 3 -> LIMA-mLAD, SVG-highOM1, SVG-RPDA (dRCA).   ENDARTERECTOMY FEMORAL Left 05/13/2021   Procedure: Left Femoral Endarterectomy WITH BOVINE PATCH PROFUNDOPLASTY, REVISION OF LEFT LIMB OF AORTO-BIFEMORAL GRAFT WITH DACRON GRAFT;  Surgeon: Cephus Shelling, MD;   Location: [facility] OR;  Service: Vascular;  Laterality: Left;   EP IMPLANTABLE DEVICE N/A 09/03/2016   Procedure: BI-VENTRICULAR IMPLANTABLE CARDIOVERTER DEFIBRILLATOR  (CRT-D);  Surgeon: Marinus Maw, MD;  Location: [facility] INVASIVE CV LAB;  Service: Cardiovascular;  Laterality: N/A;   INGUINAL HERNIA REPAIR Left 1990s   INSERTION OF ILIAC STENT Left 05/13/2021   Procedure: INSERTION OF SUPERFICIAL FEMORAL ARTERY STENT TIMES THREE AND ANGIOPLASTY;  Surgeon: Cephus Shelling, MD;  Location: [facility] OR;  Service: Vascular;  Laterality: Left;   INTRAOPERATIVE ARTERIOGRAM Left 05/13/2021   Procedure: INTRA OPERATIVE ARTERIOGRAM OF LEFT LOWER EXTREMITY;  Surgeon: Cephus Shelling, MD;  Location: [facility] OR;  Service: Vascular;  Laterality: Left;   NM MYOVIEW LTD  10/05/2022   EF ~28%.  Severely reduced function.  High risk due to decreased EF.  Large fixed severe defect in the mid to basal inferior and inferolateral wall with associated wall motion abnormality consistent with prior infarction.  No evidence of reversible defect/ischemia. =? Echo EF 40-45% with mid to apical anterior/inferolateral akinesis and inferior akinesis.   RIGHT/LEFT HEART CATH AND CORONARY/GRAFT ANGIOGRAPHY N/A 03/25/2023   Procedure: RIGHT/LEFT HEART CATH AND CORONARY/GRAFT ANGIOGRAPHY;  Surgeon: Marykay Lex, MD;  Location:  [facility];  Service: Cardiovascular;  Laterality: N/A;   STERNOTOMY  09/2013   [facility]): Sternal Wound Infection -> re-do sternotomy with metal place "sheild" placed. -Has chronic staph infection, on chronic suppressive medication   TEE WITHOUT CARDIOVERSION N/A 03/25/2023   Procedure: TRANSESOPHAGEAL ECHOCARDIOGRAM;  Surgeon: Christell Constant, MD;  [facility];  Service: Cardiovascular;  Laterality: N/A;   TRANSTHORACIC ECHOCARDIOGRAM  10/05/2022   EF 40 to 45%.  Basal to mid anterolateral and inferolateral akinesis with basal inferior akinesis.  GR 2 DD.  Severely elevated PAP with  mild RV dilation.  RV P estimated 67 mmHg).  Severe LA dilation.  Mild to moderate RA dilation.  Mild to moderate TR.  Severe MR likely with restricted posterior leaflet and inferolateral wall motion.  Moderate AOV calcification with no stenosis.  Mild AI.   TRANSTHORACIC ECHOCARDIOGRAM  01/2021   EF 25 to 30%.  Severely depressed EF.  Global HK with worst basal to mid anterolateral and inferolateral as well as anterior basal inferior walls.  GR 1 DD.  Mildly reduced RV function.  Functionally bicuspid aortic valve with moderate calcification but no stenosis.  Overall slightly improved EF.   UMBILICAL HERNIA REPAIR  03/05/2012   Procedure: HERNIA REPAIR UMBILICAL ADULT;  Surgeon: Emelia Loron, MD;  Location: [facility] OR;  Service: General;  Laterality: N/A;    Social History:  reports that he has been smoking pipe. He has never been exposed to tobacco smoke. He has quit using smokeless tobacco.  His smokeless tobacco use included snuff and chew. He reports that he does not drink alcohol and does not use drugs.  Allergies:  Allergies  Allergen Reactions   Lisinopril     Other reaction(s): Cough   Lisinopril   Family history:  Family History  Problem Relation Age of Onset   Cancer Brother        esophageal     Physical Exam: Vitals:   06/30/23 0604 06/30/23 0836 06/30/23 1150 06/30/23 1232  BP: (!) 124/91 (!) 121/96 (!) 115/90   Pulse: (!) 34 (!) 113 (!) 49   Resp: 17 18 12    Temp:  (!) 96.5 F (35.8 C)  (!) 96.7 F (35.9 C)  TempSrc:  Axillary  Axillary  SpO2: (!) 83% 95% (!) 84%    Wt Readings from Last 3 Encounters:  06/14/23 55.1 kg  06/13/23 54.9 kg  06/03/23 55.3 kg   There is no height or weight on file to calculate BMI.  General exam: Pleasant elderly Caucasian male Skin: No rashes, lesions or ulcers. HEENT: Atraumatic, normocephalic, no obvious bleeding Lungs: Clear to auscultation bilaterally CVS: Regular rate and rhythm regular, pansystolic murmur GI/Abd  soft, nontender, nondistended, bowel sound present CNS: Alert, awake, alert x 3 Psychiatry: Frustrated.  Sad affect Extremities: No pedal edema, no calf tenderness, both legs are cold to touch up to mid thigh.  No evidence of cellulitis.  ------------------------------------------------------------------------------------------------------ Assessment/Plan: Principal Problem:   Acute exacerbation of CHF (congestive heart failure) (HCC)  Acute exacerbation of CHF Ischemic cardiomyopathy Hypertension Presented with shortness of breath with minimal activities. Has mild pedal edema.  No crackles on auscultation.  No evidence of significant volume overload. BNP elevated to 3000 Diuretics has not been given in the ED Heart failure team has been consulted. PTA meds- Jardiance, Lasix, Aldactone  Severe MR currently in preop workup for mTEER for severe MR.  PAD h/o aortobifemoral bypass done at the [place] in 2014. Most recently  on 05/13/2021 he required left common femoral and SFA endarterectomy, revision of aortobifem left limb, and left SFA stent placement by Dr. Chestine Spore.  Last seen here in vascular surgery office on 9/24. Last ABI 9/24 showed moderate right and mild left lower extremity arterial disease -essentially unchanged. PTA meds- Eliquis, statin Resume Eliquis and statin  CAD/MI HLD No anginal symptoms currently.  Continue aspirin, Eliquis, statin  H/o paroxysmal A-fib  V. Tach s/p BiV-ICD  PTA meds- ?  Digoxin, mexiletine.  CHF team following.  Type 2 diabetes mellitus uncontrolled A1c 9.6 on 05/2323 PTA meds-metformin, Jardiance, glipizide Keep oral meds on hold.  Start SSI/Accu-Cheks Recent Labs  Lab 06/29/23 2325 06/30/23 0750 06/30/23 1205  GLUCAP 210* 194* 201*   Chronic anemia GERD Continue Protonix Hemoglobin stable. Recent Labs    03/25/23 1104 03/25/23 1123 03/25/23 1124 05/02/23 1540 06/29/23 2338  HGB 12.9* 13.3 12.9* 14.5 12.6*  MCV  --   --   --  82.9  81.3   COPD Continue bronchodilators  Elevated LFTs Elevated AST and ALT with normal alk phos and bilirubin.  Unclear duration.  Obtain right upper quadrant ultrasound Recent Labs  Lab 06/29/23 2338  AST 250*  ALT 286*  ALKPHOS 91  BILITOT 0.9  PROT 6.7  ALBUMIN 3.8   Home med list has not been obtained/updated by [facility] at the time of admission.  Please double check admission reconciliation.   Mobility: He does not walk much except around the house and uses electric wheelchair.  Goals of care   Code Status: Full Code    DVT prophylaxis:     Antimicrobials: None Fluid: None Consultants: CHF Family Communication: Wife at bedside  Dispo: The patient is from: Home              Anticipated d/c is to: Home likely  Diet: Diet Order             Diet heart healthy/carb modified Room service appropriate? Yes; Fluid consistency: Thin  Diet effective now                    ------------------------------------------------------------------------------------- Severity of Illness: The appropriate patient status for this patient is OBSERVATION. Observation status is judged to be reasonable and necessary in order to provide the required intensity of service to ensure the patient's safety. The patient's presenting symptoms, physical exam findings, and initial radiographic and laboratory data in the context of their medical condition is felt to place them at decreased risk for further clinical deterioration. Furthermore, it is anticipated that the patient will be medically stable for discharge from the hospital within 2 midnights of admission.  -------------------------------------------------------------------------------------  Home Meds: Prior to Admission medications   Medication Sig Start Date End Date Taking? Authorizing Provider  acetaminophen (TYLENOL) 650 MG CR tablet Take 650 mg by mouth every 8 (eight) hours as needed for pain.    [provider]   albuterol (VENTOLIN HFA) 108 (90 Base) MCG/ACT inhaler Inhale 1-2 puffs into the lungs every 6 (six) hours as needed (COPD).    [provider]  Alpha-Lipoic Acid 600 MG TABS Take 600 mg by mouth daily.    [provider]  apixaban (ELIQUIS) 5 MG TABS tablet Take 1 tablet (5 mg total) by mouth 2 (two) times daily. 02/18/23   Camnitz, Andree Coss, MD  aspirin EC 81 MG tablet Take 81 mg by mouth daily.    [provider]  atorvastatin (LIPITOR) 80 MG tablet Take 40 mg  by mouth every evening. 04/14/20   [provider]  bacitracin ointment Apply 1 Application topically 2 (two) times daily as needed for wound care.    [provider]  cholecalciferol (VITAMIN D) 25 MCG (1000 UNIT) tablet Take 2,000 Units by mouth every evening.    [provider]  dicloxacillin (DYNAPEN) 250 MG capsule Take 2 capsules (500 mg total) by mouth 3 (three) times daily. Patient taking differently: Take 500 mg by mouth 2 (two) times daily. 01/25/23   Tonny Bollman, MD  digoxin (LANOXIN) 0.125 MG tablet Take 1 tablet (0.125 mg total) by mouth daily. Monday through Friday only, None on Saturday and Sunday 06/13/23   Jacklynn Ganong, FNP  diphenhydrAMINE (BENADRYL) 25 mg capsule Take 25 mg by mouth every 6 (six) hours as needed for sleep.    [provider]  empagliflozin (JARDIANCE) 25 MG TABS tablet Take .5mg  Half tablet daily Patient taking differently: Take 0.5mg  Half tablet daily 04/12/23   Laurey Morale, MD  fexofenadine (ALLEGRA) 180 MG tablet Take 180 mg by mouth every evening.    [provider]  fluticasone (FLONASE) 50 MCG/ACT nasal spray Place 2 sprays into both nostrils daily as needed for allergies or rhinitis.    [provider]  furosemide (LASIX) 20 MG tablet Take 1 tablet (20 mg total) by mouth daily. 05/02/23   Laurey Morale, MD  glipiZIDE (GLUCOTROL) 5 MG tablet Take 5 mg by mouth 2 (two) times daily before a meal.     [provider]  lidocaine (LIDODERM) 5 % Place 1 patch onto the skin daily as needed (For pain). 10/14/22   [provider]  magnesium oxide (MAG-OX) 400 (241.3 Mg) MG tablet Take 1 tablet (400 mg total) by mouth daily. 09/04/16   Creig Hines, NP  metFORMIN (GLUCOPHAGE) 1000 MG tablet Take 1 tablet (1,000 mg total) by mouth 2 (two) times daily with a meal. Resume on 09/06/2016 09/04/16   Creig Hines, NP  metoprolol succinate (TOPROL XL) 25 MG 24 hr tablet Take 0.5 tablets (12.5 mg total) by mouth in the morning and at bedtime. 06/27/23   Marinus Maw, MD  mexiletine (MEXITIL) 150 MG capsule Take 1 capsule (150 mg total) by mouth every 12 (twelve) hours. 02/18/23   Camnitz, Andree Coss, MD  nitroGLYCERIN (NITROSTAT) 0.4 MG SL tablet Place 1 tablet (0.4 mg total) under the tongue every 5 (five) minutes x 3 doses as needed for chest pain. 09/04/16   Creig Hines, NP  Nutritional Supplements (ENSURE PLUS PO) Take 1 Can by mouth 2 (two) times daily.    [provider]  OVER THE COUNTER MEDICATION Take 1 tablet by mouth See admin instructions. Relaxium every other night as needed    [provider]  pantoprazole (PROTONIX) 40 MG tablet Take 1 tablet (40 mg total) by mouth daily. 05/22/21   Marykay Lex, MD  spironolactone (ALDACTONE) 25 MG tablet Take 0.5 tablets (12.5 mg total) by mouth daily. 05/02/23   Laurey Morale, MD  Tiotropium Bromide Monohydrate (SPIRIVA RESPIMAT) 2.5 MCG/ACT AERS Inhale 2 puffs into the lungs at bedtime.    [provider]  UNABLE TO FIND Med Name: Azelastine 137 mcg/ spray 2 times in each nostril daily.    [provider]  Vitamins A & D (VITAMIN A & D) ointment Apply 1 Application topically as needed for dry skin (on bottoms).    [provider]    Labs  on Admission:   CBC: Recent Labs  Lab 06/29/23 2338  WBC 9.6  HGB 12.6*  HCT 38.8*  MCV 81.3  PLT 284     Basic Metabolic Panel: Recent Labs  Lab 06/29/23 2338  NA 126*  K 5.3*  CL 90*  CO2 21*  GLUCOSE 240*  BUN 40*  CREATININE 1.26*  CALCIUM 9.0    Liver Function Tests: Recent Labs  Lab 06/29/23 2338  AST 250*  ALT 286*  ALKPHOS 91  BILITOT 0.9  PROT 6.7  ALBUMIN 3.8   No results for input(s): "LIPASE", "AMYLASE" in the last 168 hours. No results for input(s): "AMMONIA" in the last 168 hours.  Cardiac Enzymes: No results for input(s): "CKTOTAL", "CKMB", "CKMBINDEX", "TROPONINI" in the last 168 hours.  BNP (last 3 results) Recent Labs    05/02/23 1540 06/13/23 1616 06/30/23 0947  BNP 616.1* 561.0* 2,925.9*    ProBNP (last 3 results) No results for input(s): "PROBNP" in the last 8760 hours.  CBG: Recent Labs  Lab 06/29/23 2325 06/30/23 0750 06/30/23 1205  GLUCAP 210* 194* 201*    Lipase     Component Value Date/Time   LIPASE 64 (H) 03/05/2012 0605     Urinalysis    Component Value Date/Time   COLORURINE YELLOW 06/30/2023 0947   APPEARANCEUR CLEAR 06/30/2023 0947   LABSPEC 1.021 06/30/2023 0947   PHURINE 5.0 06/30/2023 0947   GLUCOSEU >=500 (A) 06/30/2023 0947   HGBUR NEGATIVE 06/30/2023 0947   BILIRUBINUR NEGATIVE 06/30/2023 0947   KETONESUR 5 (A) 06/30/2023 0947   PROTEINUR 30 (A) 06/30/2023 0947   UROBILINOGEN 0.2 03/03/2012 1702   NITRITE NEGATIVE 06/30/2023 0947   LEUKOCYTESUR NEGATIVE 06/30/2023 0947     Drugs of Abuse  No results found for: "LABOPIA", "COCAINSCRNUR", "LABBENZ", "AMPHETMU", "THCU", "LABBARB"    Radiological Exams on Admission: DG Chest 2 View  Result Date: 06/30/2023 CLINICAL DATA:  sob EXAM: CHEST - 2 VIEW COMPARISON:  02/17/2023. FINDINGS: Mild pulmonary vascular congestion. Bilateral lung fields are otherwise clear. No dense consolidation or lung collapse. No pneumothorax. Bilateral costophrenic angles are clear. Stable cardio-mediastinal silhouette. There is a left sided 3-lead pacemaker. Redemonstration of  multiple sternal/anterior rib ORIF hardware is. No acute osseous abnormalities. The soft tissues are within normal limits. IMPRESSION: 1. Mild pulmonary vascular congestion. Electronically Signed   By: Jules Schick M.D.   On: 06/30/2023 09:33     Signed, Lorin Glass, MD [facility] 06/30/2023

## 2023-06-30 NOTE — Sedation Documentation (Signed)
RT at bedside. ED attending physician and cardiology at bedside. Patient on defibrillator pads. Patient pre-oxygenating with 2L [employer]. End-tidal on. AMBU bag at bedside open and ready.

## 2023-06-30 NOTE — ED Notes (Signed)
ED TO INPATIENT HANDOFF REPORT  ED Nurse Name and Phone #: Einar Grad [phone]  S Name/Age/Gender [name] [name] 77 y.o. male Room/Bed: 045C/045C  Code Status   Code Status: Full Code  Home/SNF/Other Home Patient oriented to: self, place, time, and situation Is this baseline? Yes   Triage Complete: Triage complete  Chief Complaint Acute exacerbation of CHF (congestive heart failure) (HCC) [I50.9]  Triage Note Pt reports "blood sugar all out of wack". States having difficulty sleeping and lack of appetite. No complaints of pain or any SOB. Wife and dtr with him today.    Allergies Allergies  Allergen Reactions   Lisinopril     Other reaction(s): Cough    Level of Care/Admitting Diagnosis ED Disposition     ED Disposition  Admit   Condition  --   Comment  Hospital Area: [facility] [[id number]]  Level of Care: Telemetry Cardiac [[id number]]  May place patient in observation at [facility] or [facility] if equivalent level of care is available:: Yes  Covid Evaluation: Asymptomatic - no recent exposure (last 10 days) testing not required  Diagnosis: Acute exacerbation of CHF (congestive heart failure) [facility]) [[id number]]  Admitting Physician: Lorin Glass [[id number]]  Attending Physician: Lorin Glass [[id number]]          B Medical/Surgery History Past Medical History:  Diagnosis Date   Acid reflux    AICD (automatic cardioverter/defibrillator) present    a. 08/2016 St. Jude (serial Number [id number]) biventricular ICD.   Anemia    Chronic systolic CHF (congestive heart failure) (HCC)    a. 08/2016 Echo: EF 20%, diffuse HK, inf, post AK, mod-sev MR, sev dil LA, mod TR, PASP .   COPD (chronic obstructive pulmonary disease) (HCC)    Coronary artery disease involving native coronary artery of native heart with angina pectoris (HCC)    a. 10/2012 MI after aortobifem bypass, found to have multivessel CAD -->CABG x3;  b. 08/2016 Cath: LM 60, LAD 5m, LCX 60ost/p,  RCA 100p, VG->RPDA 50p, VG->OM1 ok, LIMA->LAD ok, EF 25%.   Essential hypertension 03/03/2012   History of blood transfusion 11/2012   "during his CABG"   History of gout X 1   History of stomach ulcers 1970s   Hyperlipidemia associated with type 2 diabetes mellitus (HCC) 09/16/2011   Ischemic cardiomyopathy    a. 08/2016 Echo: EF 20%, diffuse HK, inf, post AK;  b. 08/2016 St. Jude (serial Number [id number]) biventricular ICD.   Myocardial infarction [facility])    PAD (peripheral artery disease) (HCC)    s/p aortobifemoral bypass 10/2012   Peripheral neuropathy    PTSD (post-traumatic stress disorder)    Type II diabetes mellitus (HCC)    Ventricular tachycardia (HCC)    a. 08/2016 St. Jude (serial Number [id number]) biventricular ICD-->oral amio added.   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 Is now on chronic suppressive antibiotics with dicloxacillin   Past Surgical History:  Procedure Laterality Date   ABDOMINAL AORTOGRAM W/LOWER EXTREMITY Left 05/07/2021   Procedure: ABDOMINAL AORTOGRAM W/LOWER EXTREMITY;  Surgeon: [name], MD;  Location: [facility] INVASIVE CV LAB;  Service: Cardiovascular;  Laterality: Left;   AORTO-FEMORAL BYPASS GRAFT Bilateral 10/2012   [facility]   CARDIAC CATHETERIZATION  04/03/2013   [place] (? for abnormal Nuclear ST) --> no PCI, by report, patent grafts.   CARDIAC CATHETERIZATION N/A 09/02/2016   Procedure: Left Heart  Cath and Cors/Grafts Angiography;  Surgeon: Kathleene Hazel, MD;  Location: [facility] INVASIVE CV LAB:: Ost LM 60% -> mid LAD 99% subCTO, ost-prox LCx 60%; prox RCA 100% CTO w/ (small) SVG->RPDA prox 50%; SVG->OM1 ok, LIMA->mLAD ok, EF 25% - complicated by VT during access - Shock x 2, Lidocaine & Amiodarone   CARDIAC CATHETERIZATION  11/09/2012   [facility]: due to Sustained VT post-Ob Ao-BiFem Bypass. (no Angina)    CHOLECYSTECTOMY  03/05/2012   Procedure: LAPAROSCOPIC CHOLECYSTECTOMY WITH INTRAOPERATIVE CHOLANGIOGRAM;  Surgeon: Emelia Loron, MD;  Location: [facility] OR;  Service: General;  Laterality: N/A;   CORONARY ARTERY BYPASS GRAFT  11/2012   [place]: CABG X 3 -> LIMA-mLAD, SVG-highOM1, SVG-RPDA (dRCA).   ENDARTERECTOMY FEMORAL Left 05/13/2021   Procedure: Left Femoral Endarterectomy WITH BOVINE PATCH PROFUNDOPLASTY, REVISION OF LEFT LIMB OF AORTO-BIFEMORAL GRAFT WITH DACRON GRAFT;  Surgeon: Cephus Shelling, MD;  Location: [facility] OR;  Service: Vascular;  Laterality: Left;   EP IMPLANTABLE DEVICE N/A 09/03/2016   Procedure: BI-VENTRICULAR IMPLANTABLE CARDIOVERTER DEFIBRILLATOR  (CRT-D);  Surgeon: Marinus Maw, MD;  Location: [facility] INVASIVE CV LAB;  Service: Cardiovascular;  Laterality: N/A;   INGUINAL HERNIA REPAIR Left 1990s   INSERTION OF ILIAC STENT Left 05/13/2021   Procedure: INSERTION OF SUPERFICIAL FEMORAL ARTERY STENT TIMES THREE AND ANGIOPLASTY;  Surgeon: Cephus Shelling, MD;  Location: [facility] OR;  Service: Vascular;  Laterality: Left;   INTRAOPERATIVE ARTERIOGRAM Left 05/13/2021   Procedure: INTRA OPERATIVE ARTERIOGRAM OF LEFT LOWER EXTREMITY;  Surgeon: Cephus Shelling, MD;  Location: [facility] OR;  Service: Vascular;  Laterality: Left;   NM MYOVIEW LTD  10/05/2022   EF ~28%.  Severely reduced function.  High risk due to decreased EF.  Large fixed severe defect in the mid to basal inferior and inferolateral wall with associated wall motion abnormality consistent with prior infarction.  No evidence of reversible defect/ischemia. =? Echo EF 40-45% with mid to apical anterior/inferolateral akinesis and inferior akinesis.   RIGHT/LEFT HEART CATH AND CORONARY/GRAFT ANGIOGRAPHY N/A 03/25/2023   Procedure: RIGHT/LEFT HEART CATH AND CORONARY/GRAFT ANGIOGRAPHY;  Surgeon: Marykay Lex, MD;  Location: [facility] INVASIVE CV LAB;  Service: Cardiovascular;  Laterality: N/A;   STERNOTOMY  09/2013   [facility]):  Sternal Wound Infection -> re-do sternotomy with metal place "sheild" placed. -Has chronic staph infection, on chronic suppressive medication   TEE WITHOUT CARDIOVERSION N/A 03/25/2023   Procedure: TRANSESOPHAGEAL ECHOCARDIOGRAM;  Surgeon: Christell Constant, MD;  [facility];  Service: Cardiovascular;  Laterality: N/A;   TRANSTHORACIC ECHOCARDIOGRAM  10/05/2022   EF 40 to 45%.  Basal to mid anterolateral and inferolateral akinesis with basal inferior akinesis.  GR 2 DD.  Severely elevated PAP with mild RV dilation.  RV P estimated 67 mmHg).  Severe LA dilation.  Mild to moderate RA dilation.  Mild to moderate TR.  Severe MR likely with restricted posterior leaflet and inferolateral wall motion.  Moderate AOV calcification with no stenosis.  Mild AI.   TRANSTHORACIC ECHOCARDIOGRAM  01/2021   EF 25 to 30%.  Severely depressed EF.  Global HK with worst basal to mid anterolateral and inferolateral as well as anterior basal inferior walls.  GR 1 DD.  Mildly reduced RV function.  Functionally bicuspid aortic valve with moderate calcification but no stenosis.  Overall slightly improved EF.   UMBILICAL HERNIA REPAIR  03/05/2012   Procedure: HERNIA REPAIR UMBILICAL ADULT;  Surgeon: Emelia Loron, MD;  Location: [facility] OR;  Service: General;  Laterality: N/A;     A IV Location/Drains/Wounds Patient Lines/Drains/Airways Status     Active Line/Drains/Airways     Name Placement date Placement time Site Days   Peripheral IV 06/30/23 18 G Left Antecubital 06/30/23  1405  Antecubital  less than 1   Peripheral IV 06/30/23 20 G Anterior;Proximal;Right Forearm 06/30/23  --  Forearm  less than 1   External Urinary Catheter 06/30/23  1614  --  less than 1   Incision - 4 Ports Abdomen Umbilicus Mid;Upper Right;Upper Right;Lower 03/05/12  0808  -- 4134   Pressure Injury 02/17/23 Coccyx Right;Left Stage 2 -  Partial thickness loss of dermis presenting as a shallow open injury with a red, pink  wound bed without slough. 02/17/23  1730  -- 133   Wound / Incision (Open or Dehisced) 05/13/21 Venous stasis ulcer Foot Anterior;Left 05/13/21  1820  Foot  778            Intake/Output Last 24 hours  Intake/Output Summary (Last 24 hours) at 06/30/2023 1658 Last data filed at 06/30/2023 1435 Gross per 24 hour  Intake --  Output 375 ml  Net -375 ml    Labs/Imaging Results for orders placed or performed during the hospital encounter of 06/29/23 (from the past 48 hour(s))  CBG monitoring, ED     Status: Abnormal   Collection Time: 06/29/23 11:25 PM  Result Value Ref Range   Glucose-Capillary 210 (H) 70 - 99 mg/dL    Comment: Glucose reference range applies only to samples taken after fasting for at least 8 hours.  CBC     Status: Abnormal   Collection Time: 06/29/23 11:38 PM  Result Value Ref Range   WBC 9.6 4.0 - 10.5 K/uL   RBC 4.77 4.22 - 5.81 MIL/uL   Hemoglobin 12.6 (L) 13.0 - 17.0 g/dL   HCT 41.3 (L) 24.4 - 01.0 %   MCV 81.3 80.0 - 100.0 fL   MCH 26.4 26.0 - 34.0 pg   MCHC 32.5 30.0 - 36.0 g/dL   RDW 27.2 (H) 53.6 - 64.4 %   Platelets 284 150 - 400 K/uL   nRBC 0.4 (H) 0.0 - 0.2 %    Comment: Performed at [facility], [address], [place], [place] [address]  Comprehensive metabolic panel     Status: Abnormal   Collection Time: 06/29/23 11:38 PM  Result Value Ref Range   Sodium 126 (L) 135 - 145 mmol/L   Potassium 5.3 (H) 3.5 - 5.1 mmol/L   Chloride 90 (L) 98 - 111 mmol/L   CO2 21 (L) 22 - 32 mmol/L   Glucose, Bld 240 (H) 70 - 99 mg/dL    Comment: Glucose reference range applies only to samples taken after fasting for at least 8 hours.   BUN 40 (H) 8 - 23 mg/dL   Creatinine, Ser 2.59 (H) 0.61 - 1.24 mg/dL   Calcium 9.0 8.9 - 56.3 mg/dL   Total Protein 6.7 6.5 - 8.1 g/dL   Albumin 3.8 3.5 - 5.0 g/dL   AST 875 (H) 15 - 41 U/L   ALT 286 (H) 0 - 44 U/L   Alkaline Phosphatase 91 38 - 126 U/L   Total Bilirubin 0.9 0.3 - 1.2 mg/dL   GFR, Estimated 59 (L)  >60 mL/min    Comment: (NOTE) Calculated using the CKD-EPI Creatinine Equation (2021)    Anion gap 15 5 - 15    Comment: Performed at [facility], [address] [place], [place] [address]  CBG monitoring, ED     Status: Abnormal   Collection Time: 06/30/23  7:50 AM  Result Value Ref Range   Glucose-Capillary 194 (H) 70 - 99 mg/dL    Comment: Glucose reference range applies only to samples taken after fasting for at least 8 hours.  Resp panel by RT-PCR (RSV, Flu A&B, Covid) Anterior Nasal Swab     Status: None   Collection Time: 06/30/23  8:08 AM   Specimen: Anterior Nasal Swab  Result Value Ref Range   SARS Coronavirus 2 by RT PCR NEGATIVE NEGATIVE   Influenza A by PCR NEGATIVE NEGATIVE   Influenza B by PCR NEGATIVE NEGATIVE    Comment: (NOTE) The Xpert Xpress SARS-CoV-2/FLU/RSV plus assay is intended as an aid in the diagnosis of influenza from Nasopharyngeal swab specimens and should not be used as a sole basis for treatment. Nasal washings and aspirates are unacceptable for Xpert Xpress SARS-CoV-2/FLU/RSV testing.  Fact Sheet for Patients: BloggerCourse.com  Fact Sheet for Healthcare Providers: SeriousBroker.it  This test is not yet approved or cleared by the [place] FDA and has been authorized for detection and/or diagnosis of SARS-CoV-2 by FDA under an Emergency Use Authorization (EUA). This EUA will remain in effect (meaning this test can be used) for the duration of the COVID-19 declaration under Section 564(b)(1) of the Act, 21 U.S.C. section 360bbb-3(b)(1), unless the authorization is terminated or revoked.     Resp Syncytial Virus by PCR NEGATIVE NEGATIVE    Comment: (NOTE) Fact Sheet for Patients: BloggerCourse.com  Fact Sheet for Healthcare Providers: SeriousBroker.it  This test is not yet approved or cleared by the [place] FDA and has  been authorized for detection and/or diagnosis of SARS-CoV-2 by FDA under an Emergency Use Authorization (EUA). This EUA will remain in effect (meaning this test can be used) for the duration of the COVID-19 declaration under Section 564(b)(1) of the Act, 21 U.S.C. section 360bbb-3(b)(1), unless the authorization is terminated or revoked.  Performed at [facility], [address], [place], [place] [address]   Urinalysis, Routine w reflex microscopic -Urine, Clean Catch     Status: Abnormal   Collection Time: 06/30/23  9:47 AM  Result Value Ref Range   Color, Urine YELLOW YELLOW   APPearance CLEAR CLEAR   Specific Gravity, Urine 1.021 1.005 - 1.030   pH 5.0 5.0 - 8.0   Glucose, UA >=500 (A) NEGATIVE mg/dL   Hgb urine dipstick NEGATIVE NEGATIVE   Bilirubin Urine NEGATIVE NEGATIVE   Ketones, ur 5 (A) NEGATIVE mg/dL   Protein, ur 30 (A) NEGATIVE mg/dL   Nitrite NEGATIVE NEGATIVE   Leukocytes,Ua NEGATIVE NEGATIVE   RBC / HPF 0-5 0 - 5 RBC/hpf   WBC, UA 0-5 0 - 5 WBC/hpf   Bacteria, UA NONE SEEN NONE SEEN   Squamous Epithelial / HPF 0-5 0 - 5 /HPF    Comment: Performed at [facility], [address] [place], [place] [address]  Protime-INR     Status: Abnormal   Collection Time: 06/30/23  9:47 AM  Result Value Ref Range   Prothrombin Time 22.5 (H) 11.4 - 15.2 seconds   INR 2.0 (H) 0.8 - 1.2    Comment: (NOTE) INR goal varies based on device and disease states. Performed at [facility], [address], [place], [place] [address]   Digoxin level     Status: None   Collection Time: 06/30/23  9:47 AM  Result Value Ref Range   Digoxin Level 0.9 0.8 - 2.0 ng/mL    Comment: Performed at [facility], [address], [place], [place] [address]  Troponin I (High Sensitivity)     Status: Abnormal   Collection Time: 06/30/23  9:47 AM  Result Value Ref Range   Troponin I (High Sensitivity) 62 (H) <18 ng/L    Comment: (NOTE) Elevated high sensitivity troponin  I (hsTnI) values and significant  changes across serial measurements may suggest ACS but many other  chronic and acute conditions are known to elevate hsTnI results.  Refer to the "Links" section for chest pain algorithms and additional  guidance. Performed at [facility], [address], [place], [place] [address]   Brain natriuretic peptide     Status: Abnormal   Collection Time: 06/30/23  9:47 AM  Result Value Ref Range   B Natriuretic Peptide 2,925.9 (H) 0.0 - 100.0 pg/mL    Comment: Performed at [facility], [address], [place], [place] [address]  Troponin I (High Sensitivity)     Status: Abnormal   Collection Time: 06/30/23 10:07 AM  Result Value Ref Range   Troponin I (High Sensitivity) 64 (H) <18 ng/L    Comment: (NOTE) Elevated high sensitivity troponin I (hsTnI) values and significant  changes across serial measurements may suggest ACS but many other  chronic and acute conditions are known to elevate hsTnI results.  Refer to the "Links" section for chest pain algorithms and additional  guidance. Performed at [facility], [address] [place], [place] [address]   Comprehensive metabolic panel     Status: Abnormal   Collection Time: 06/30/23 10:07 AM  Result Value Ref Range   Sodium 129 (L) 135 - 145 mmol/L   Potassium 5.5 (H) 3.5 - 5.1 mmol/L   Chloride 95 (L) 98 - 111 mmol/L   CO2 19 (L) 22 - 32 mmol/L   Glucose, Bld 183 (H) 70 - 99 mg/dL    Comment: Glucose reference range applies only to samples taken after fasting for at least 8 hours.   BUN 42 (H) 8 - 23 mg/dL   Creatinine, Ser 2.95 (H) 0.61 - 1.24 mg/dL   Calcium 9.0 8.9 - 18.8 mg/dL   Total Protein 6.2 (L) 6.5 - 8.1 g/dL   Albumin 3.7 3.5 - 5.0 g/dL   AST 416 (H) 15 - 41 U/L   ALT 515 (H) 0 - 44 U/L   Alkaline Phosphatase 87 38 - 126 U/L   Total Bilirubin 0.9 0.3 - 1.2 mg/dL   GFR, Estimated 58 (L) >60 mL/min    Comment: (NOTE) Calculated using the CKD-EPI Creatinine Equation  (2021)    Anion gap 15 5 - 15    Comment: Performed at [facility], [address], [place], [place] [address]  CBG monitoring, ED     Status: Abnormal   Collection Time: 06/30/23 12:05 PM  Result Value Ref Range   Glucose-Capillary 201 (H) 70 - 99 mg/dL    Comment: Glucose reference range applies only to samples taken after fasting for at least 8 hours.  Lactic acid, plasma     Status: Abnormal   Collection Time: 06/30/23  2:07 PM  Result Value Ref Range   Lactic Acid, Venous 3.9 (HH) 0.5 - 1.9 mmol/L    Comment: CRITICAL RESULT CALLED TO, READ BACK BY AND VERIFIED WITH M.Evann Koelzer [email or web address]  10.10.2024 E.AHMED Performed at [facility],  [address], [place], [place] [address]    US Abdomen Limited RUQ (LIVER/GB)  Result Date: 06/30/2023 CLINICAL DATA:  Elevated liver enzymes. EXAM: ULTRASOUND ABDOMEN LIMITED RIGHT UPPER QUADRANT COMPARISON:  Abdominal ultrasound dated 03/02/2012. FINDINGS: Gallbladder: Surgically absent.  No sonographic Murphy sign noted by sonographer. Common bile duct: Diameter: 5 mm Liver: No focal lesion identified. Within normal limits in parenchymal echogenicity. Portal vein is patent on color Doppler imaging with normal direction of blood flow towards the liver. Other: None. IMPRESSION: No biliary dilatation. Electronically Signed   By: Romona Curls M.D.   On: 06/30/2023 15:32   DG Chest 2 View  Result Date: 06/30/2023 CLINICAL DATA:  sob EXAM: CHEST - 2 VIEW COMPARISON:  02/17/2023. FINDINGS: Mild pulmonary vascular congestion. Bilateral lung fields are otherwise clear. No dense consolidation or lung collapse. No pneumothorax. Bilateral costophrenic angles are clear. Stable cardio-mediastinal silhouette. There is a left sided 3-lead pacemaker. Redemonstration of multiple sternal/anterior rib ORIF hardware is. No acute osseous abnormalities. The soft tissues are within normal limits. IMPRESSION: 1. Mild pulmonary vascular congestion. Electronically Signed    By: Jules Schick M.D.   On: 06/30/2023 09:33    Pending Labs Unresulted Labs (From admission, onward)     Start     Ordered   07/01/23 0500  CBC  Tomorrow morning,   R        06/30/23 1154   07/01/23 0500  Magnesium  Tomorrow morning,   R        06/30/23 1154   07/01/23 0500  Comprehensive metabolic panel  Daily,   R      06/30/23 1308   06/30/23 1354  Lactic acid, plasma  (Lactic Acid)  STAT Now then every 3 hours,   R (with STAT occurrences)      06/30/23 1353            Vitals/Pain Today's Vitals   06/30/23 1651 06/30/23 1652 06/30/23 1654 06/30/23 1657  BP:  120/87 139/73 121/81  Pulse:  (!) 107 (!) 105 (!) 102  Resp:  17 15 15   Temp:      TempSrc:      SpO2:  99% 100% 100%  Weight: 57 kg     Height:      PainSc:        Isolation Precautions No active isolations  Medications Medications  insulin aspart (novoLOG) injection 0-9 Units (3 Units Subcutaneous Given 06/30/23 1209)  insulin aspart (novoLOG) injection 0-5 Units (has no administration in time range)  acetaminophen (TYLENOL) tablet 650 mg (has no administration in time range)    Or  acetaminophen (TYLENOL) suppository 650 mg (has no administration in time range)  albuterol (PROVENTIL) (2.5 MG/3ML) 0.083% nebulizer solution 2.5 mg (has no administration in time range)  hydrALAZINE (APRESOLINE) injection 10 mg (has no administration in time range)  apixaban (ELIQUIS) tablet 5 mg (5 mg Oral Given 06/30/23 1412)  furosemide (LASIX) injection 80 mg (has no administration in time range)  digoxin (LANOXIN) tablet 0.25 mg (has no administration in time range)  digoxin (LANOXIN) tablet 0.125 mg (has no administration in time range)  atorvastatin (LIPITOR) tablet 40 mg (has no administration in time range)  metoprolol succinate (TOPROL-XL) 24 hr tablet 12.5 mg (has no administration in time range)  mexiletine (MEXITIL) capsule 150 mg (has no administration in time range)  pantoprazole (PROTONIX) EC tablet 40  mg (has no administration in time range)  feeding supplement (ENSURE ENLIVE / ENSURE PLUS) liquid 237 mL (  237 mLs Oral Not Given 06/30/23 1547)  PHENYLephrine 80 mcg/ml in normal saline Adult IV Push Syringe (For Blood Pressure Support) (has no administration in time range)  magnesium sulfate IVPB 2 g 50 mL (has no administration in time range)  furosemide (LASIX) injection 80 mg (80 mg Intravenous Given 06/30/23 1413)  ondansetron (ZOFRAN) injection 4 mg (4 mg Intravenous Given 06/30/23 1636)  etomidate (AMIDATE) injection 8 mg (8 mg Intravenous Given 06/30/23 1641)    Mobility walks with device     Focused Assessments Cardiac Assessment Handoff:  Cardiac Rhythm: Sinus tachycardia Lab Results  Component Value Date   TROPONINI 0.07 (HH) 09/02/2016   No results found for: "DDIMER" Does the Patient currently have chest pain? No    R Recommendations: See Admitting Provider Note  Report given to:   Additional Notes: Patient cardioverted in ED for afib rvr

## 2023-07-01 DIAGNOSIS — E1169 Type 2 diabetes mellitus with other specified complication: Secondary | ICD-10-CM | POA: Diagnosis present

## 2023-07-01 DIAGNOSIS — I5021 Acute systolic (congestive) heart failure: Principal | ICD-10-CM

## 2023-07-01 DIAGNOSIS — E1151 Type 2 diabetes mellitus with diabetic peripheral angiopathy without gangrene: Secondary | ICD-10-CM | POA: Diagnosis present

## 2023-07-01 DIAGNOSIS — Z7901 Long term (current) use of anticoagulants: Secondary | ICD-10-CM | POA: Diagnosis not present

## 2023-07-01 DIAGNOSIS — Z1152 Encounter for screening for COVID-19: Secondary | ICD-10-CM | POA: Diagnosis not present

## 2023-07-01 DIAGNOSIS — R570 Cardiogenic shock: Secondary | ICD-10-CM | POA: Diagnosis present

## 2023-07-01 DIAGNOSIS — Z951 Presence of aortocoronary bypass graft: Secondary | ICD-10-CM | POA: Diagnosis not present

## 2023-07-01 DIAGNOSIS — E1165 Type 2 diabetes mellitus with hyperglycemia: Secondary | ICD-10-CM | POA: Diagnosis present

## 2023-07-01 DIAGNOSIS — K761 Chronic passive congestion of liver: Secondary | ICD-10-CM | POA: Diagnosis present

## 2023-07-01 DIAGNOSIS — Z95820 Peripheral vascular angioplasty status with implants and grafts: Secondary | ICD-10-CM | POA: Diagnosis not present

## 2023-07-01 DIAGNOSIS — E871 Hypo-osmolality and hyponatremia: Secondary | ICD-10-CM | POA: Diagnosis present

## 2023-07-01 DIAGNOSIS — E1142 Type 2 diabetes mellitus with diabetic polyneuropathy: Secondary | ICD-10-CM | POA: Diagnosis present

## 2023-07-01 DIAGNOSIS — I340 Nonrheumatic mitral (valve) insufficiency: Secondary | ICD-10-CM | POA: Diagnosis present

## 2023-07-01 DIAGNOSIS — I110 Hypertensive heart disease with heart failure: Secondary | ICD-10-CM | POA: Diagnosis present

## 2023-07-01 DIAGNOSIS — I480 Paroxysmal atrial fibrillation: Secondary | ICD-10-CM | POA: Diagnosis present

## 2023-07-01 DIAGNOSIS — I4892 Unspecified atrial flutter: Secondary | ICD-10-CM | POA: Diagnosis present

## 2023-07-01 DIAGNOSIS — J449 Chronic obstructive pulmonary disease, unspecified: Secondary | ICD-10-CM | POA: Diagnosis present

## 2023-07-01 DIAGNOSIS — E875 Hyperkalemia: Secondary | ICD-10-CM | POA: Diagnosis present

## 2023-07-01 DIAGNOSIS — I472 Ventricular tachycardia, unspecified: Secondary | ICD-10-CM | POA: Diagnosis present

## 2023-07-01 DIAGNOSIS — I2510 Atherosclerotic heart disease of native coronary artery without angina pectoris: Secondary | ICD-10-CM | POA: Diagnosis not present

## 2023-07-01 DIAGNOSIS — R5381 Other malaise: Secondary | ICD-10-CM | POA: Diagnosis present

## 2023-07-01 DIAGNOSIS — I428 Other cardiomyopathies: Secondary | ICD-10-CM | POA: Diagnosis present

## 2023-07-01 DIAGNOSIS — N179 Acute kidney failure, unspecified: Secondary | ICD-10-CM | POA: Diagnosis present

## 2023-07-01 DIAGNOSIS — E785 Hyperlipidemia, unspecified: Secondary | ICD-10-CM | POA: Diagnosis present

## 2023-07-01 DIAGNOSIS — D649 Anemia, unspecified: Secondary | ICD-10-CM | POA: Diagnosis present

## 2023-07-01 DIAGNOSIS — I5043 Acute on chronic combined systolic (congestive) and diastolic (congestive) heart failure: Secondary | ICD-10-CM | POA: Diagnosis not present

## 2023-07-01 DIAGNOSIS — Z95810 Presence of automatic (implantable) cardiac defibrillator: Secondary | ICD-10-CM | POA: Diagnosis not present

## 2023-07-01 DIAGNOSIS — I5023 Acute on chronic systolic (congestive) heart failure: Secondary | ICD-10-CM | POA: Diagnosis present

## 2023-07-01 LAB — COMPREHENSIVE METABOLIC PANEL
ALT: 781 U/L — ABNORMAL HIGH (ref 0–44)
AST: 630 U/L — ABNORMAL HIGH (ref 15–41)
Albumin: 3.8 g/dL (ref 3.5–5.0)
Alkaline Phosphatase: 90 U/L (ref 38–126)
Anion gap: 18 — ABNORMAL HIGH (ref 5–15)
BUN: 38 mg/dL — ABNORMAL HIGH (ref 8–23)
CO2: 23 mmol/L (ref 22–32)
Calcium: 8.6 mg/dL — ABNORMAL LOW (ref 8.9–10.3)
Chloride: 88 mmol/L — ABNORMAL LOW (ref 98–111)
Creatinine, Ser: 1.1 mg/dL (ref 0.61–1.24)
GFR, Estimated: >60 mL/min (ref >60)
Glucose, Bld: 127 mg/dL — ABNORMAL HIGH (ref 70–99)
Potassium: 3.7 mmol/L (ref 3.5–5.1)
Sodium: 129 mmol/L — ABNORMAL LOW (ref 135–145)
Total Bilirubin: 1 mg/dL (ref 0.3–1.2)
Total Protein: 6.5 g/dL (ref 6.5–8.1)

## 2023-07-01 LAB — CBC
HCT: 37.9 % — ABNORMAL LOW (ref 39.0–52.0)
Hemoglobin: 12.4 g/dL — ABNORMAL LOW (ref 13.0–17.0)
MCH: 25.9 pg — ABNORMAL LOW (ref 26.0–34.0)
MCHC: 32.7 g/dL (ref 30.0–36.0)
MCV: 79.3 fL — ABNORMAL LOW (ref 80.0–100.0)
Platelets: 237 10*3/uL (ref 150–400)
RBC: 4.78 MIL/uL (ref 4.22–5.81)
RDW: 15.3 % (ref 11.5–15.5)
WBC: 7.5 10*3/uL (ref 4.0–10.5)
nRBC: 0.5 % — ABNORMAL HIGH (ref 0.0–0.2)

## 2023-07-01 LAB — LACTIC ACID, PLASMA: Lactic Acid, Venous: 2.1 mmol/L (ref 0.5–1.9)

## 2023-07-01 LAB — GLUCOSE, CAPILLARY
Glucose-Capillary: 111 mg/dL — ABNORMAL HIGH (ref 70–99)
Glucose-Capillary: 188 mg/dL — ABNORMAL HIGH (ref 70–99)
Glucose-Capillary: 205 mg/dL — ABNORMAL HIGH (ref 70–99)
Glucose-Capillary: 190 mg/dL — ABNORMAL HIGH (ref 70–99)

## 2023-07-01 LAB — MAGNESIUM: Magnesium: 2.2 mg/dL (ref 1.7–2.4)

## 2023-07-01 MED ORDER — ORAL CARE MOUTH RINSE: 15.0000 mL | OROMUCOSAL | Status: DC | PRN

## 2023-07-01 MED ORDER — FUROSEMIDE 10 MG/ML IJ SOLN: 80.0000 mg | Freq: Once | INTRAMUSCULAR | Status: DC

## 2023-07-01 MED ORDER — SPIRONOLACTONE 12.5 MG HALF TABLET
12.5000 mg | ORAL_TABLET | Freq: Every day | ORAL | Status: DC
  Administered 2023-07-01 – 2023-07-02 (×2): 12.5 mg via ORAL
  Filled 2023-07-01 (×2): qty 1

## 2023-07-01 MED ORDER — FUROSEMIDE 40 MG PO TABS
40.0000 mg | ORAL_TABLET | Freq: Every day | ORAL | Status: DC
  Administered 2023-07-02: 40 mg via ORAL
  Filled 2023-07-01: qty 1

## 2023-07-01 MED ORDER — EMPAGLIFLOZIN 10 MG PO TABS
10.0000 mg | ORAL_TABLET | Freq: Every day | ORAL | Status: DC
  Administered 2023-07-01 – 2023-07-02 (×2): 10 mg via ORAL
  Filled 2023-07-01 (×2): qty 1

## 2023-07-01 NOTE — Progress Notes (Addendum)
Advanced Heart Failure Rounding Note  PCP-Cardiologist: Bryan Lemma, MD   Subjective:   10/10 admitted with A/C HFrEF 2/2 a fib RVR  S/p emergent DCCV x1 in ED yesterday >> ST  Started on 80 IV BID lasix. -4.3L UOP. Weight down 3lbs.   SCr 1.29>1.1 with diuresis  Feels so much better today. Appetite is back, "cleaned his plate this morning".  Breathing feels much better today. Worked with PT already this morning.   Objective:   Weight Range: 54.3 kg Body mass index is 19.34 kg/m.   Vital Signs:   Temp:  [96.7 F (35.9 C)-97.9 F (36.6 C)] 97.9 F (36.6 C) (10/11 0730) Pulse Rate:  [97-139] 97 (10/11 0730) Resp:  [12-22] 17 (10/11 0730) BP: (100-142)/(58-91) 119/78 (10/11 0730) SpO2:  [84 %-100 %] 98 % (10/11 0730) Weight:  [54.3 kg-57 kg] 54.3 kg (10/11 0446)    Weight change: Filed Weights   06/30/23 1651 06/30/23 1756 07/01/23 0446  Weight: 57 kg 55.7 kg 54.3 kg    Intake/Output:   Intake/Output Summary (Last 24 hours) at 07/01/2023 0836 Last data filed at 07/01/2023 1610 Gross per 24 hour  Intake 120 ml  Output 4325 ml  Net -4205 ml      Physical Exam  General:  elderly appearing.  No respiratory difficulty HEENT: normal Neck: supple. JVD ~7 cm. Carotids 2+ bilat; no bruits. No lymphadenopathy or thyromegaly appreciated. Cor: PMI nondisplaced. Regular rate & rhythm. No rubs, gallops or 4/6 MR murmur. Lungs: coarse throughout Abdomen: soft, nontender, nondistended. No hepatosplenomegaly. No bruits or masses. Good bowel sounds. Extremities: no cyanosis, clubbing, rash, edema  Neuro: alert & oriented x 3, cranial nerves grossly intact. moves all 4 extremities w/o difficulty. Affect pleasant.   Telemetry   V-Paced 90s (Personally reviewed)    EKG    V paced 101  Labs    CBC Recent Labs    06/29/23 2338 07/01/23 0456  WBC 9.6 7.5  HGB 12.6* 12.4*  HCT 38.8* 37.9*  MCV 81.3 79.3*  PLT 284 237   Basic Metabolic Panel Recent Labs     06/30/23 1007 07/01/23 0456  NA 129* 129*  K 5.5* 3.7  CL 95* 88*  CO2 19* 23  GLUCOSE 183* 127*  BUN 42* 38*  CREATININE 1.29* 1.10  CALCIUM 9.0 8.6*  MG  --  2.2   Liver Function Tests Recent Labs    06/30/23 1007 07/01/23 0456  AST 439* 630*  ALT 515* 781*  ALKPHOS 87 90  BILITOT 0.9 1.0  PROT 6.2* 6.5  ALBUMIN 3.7 3.8   No results for input(s): "LIPASE", "AMYLASE" in the last 72 hours. Cardiac Enzymes No results for input(s): "CKTOTAL", "CKMB", "CKMBINDEX", "TROPONINI" in the last 72 hours.  BNP: BNP (last 3 results) Recent Labs    05/02/23 1540 06/13/23 1616 06/30/23 0947  BNP 616.1* 561.0* 2,925.9*    ProBNP (last 3 results) No results for input(s): "PROBNP" in the last 8760 hours.   D-Dimer No results for input(s): "DDIMER" in the last 72 hours. Hemoglobin A1C No results for input(s): "HGBA1C" in the last 72 hours. Fasting Lipid Panel No results for input(s): "CHOL", "HDL", "LDLCALC", "TRIG", "CHOLHDL", "LDLDIRECT" in the last 72 hours. Thyroid Function Tests No results for input(s): "TSH", "T4TOTAL", "T3FREE", "THYROIDAB" in the last 72 hours.  Invalid input(s): "FREET3"  Other results:   Imaging    US Abdomen Limited RUQ (LIVER/GB)  Result Date: 06/30/2023 CLINICAL DATA:  Elevated liver enzymes. EXAM: ULTRASOUND  ABDOMEN LIMITED RIGHT UPPER QUADRANT COMPARISON:  Abdominal ultrasound dated 03/02/2012. FINDINGS: Gallbladder: Surgically absent.  No sonographic Murphy sign noted by sonographer. Common bile duct: Diameter: 5 mm Liver: No focal lesion identified. Within normal limits in parenchymal echogenicity. Portal vein is patent on color Doppler imaging with normal direction of blood flow towards the liver. Other: None. IMPRESSION: No biliary dilatation. Electronically Signed   By: Romona Curls M.D.   On: 06/30/2023 15:32   DG Chest 2 View  Result Date: 06/30/2023 CLINICAL DATA:  sob EXAM: CHEST - 2 VIEW COMPARISON:  02/17/2023. FINDINGS:  Mild pulmonary vascular congestion. Bilateral lung fields are otherwise clear. No dense consolidation or lung collapse. No pneumothorax. Bilateral costophrenic angles are clear. Stable cardio-mediastinal silhouette. There is a left sided 3-lead pacemaker. Redemonstration of multiple sternal/anterior rib ORIF hardware is. No acute osseous abnormalities. The soft tissues are within normal limits. IMPRESSION: 1. Mild pulmonary vascular congestion. Electronically Signed   By: Jules Schick M.D.   On: 06/30/2023 09:33     Medications:     Scheduled Medications:  apixaban  5 mg Oral BID   atorvastatin  40 mg Oral Daily   digoxin  0.125 mg Oral Daily   feeding supplement  237 mL Oral BID BM   furosemide  80 mg Intravenous BID   insulin aspart  0-5 Units Subcutaneous QHS   insulin aspart  0-9 Units Subcutaneous TID WC   metoprolol succinate  12.5 mg Oral Daily   mexiletine  150 mg Oral Q12H   nicotine  21 mg Transdermal Daily   pantoprazole  40 mg Oral Daily    Infusions:   PRN Medications: acetaminophen **OR** acetaminophen, albuterol, hydrALAZINE    Patient Profile   77 y.o. with history of CAD s/p CABG, ischemic cardiomyopathy, PAD s/p aortobifemoral bypass, PAF, and severe mitral regurgitation. AHF to see for A/C HFrEF 2/2 a fib RVR.   Assessment/Plan  Acute on chronic systolic heart failure> Cardiogenic shock - Ischemic cardiomyopathy. S/p St Jude CRT-D device. TEE in 7/24 showing EF 30-35%, severe MR with restricted posterior leaflet and small flail segment in posterior leaflet, RV normal, mild AI. Recent RHC showed elevated filling pressures and low cardiac output.  - Patient is quite frail, NYHA class IV symptoms. Limited to activity around the house. Suspect he is limited by advanced heart failure/volume overload worsened by ischemic mitral regurgitation as well as significant deconditioning.  - Volume overloaded on exam, BNP almost 3K. Suspect 2/2 a fib RVR and low output HF -  Will give morning dose of IV lasix 80mg , -4.3L UOP. Weight down 3lbs. Transition to 40 mg daily PO tomorrow.  - Lactic acid 3.9>4.6>2.1 - Continue metoprolol 12.5 mg daily  - Start digoxin 0.125 today - strict I&O, daily weights   Atrial fibrillation/Atrial flutter - per device interrogation went into a fib 10/3 - Paroxysmal. Has failed amiodarone (lung toxicity) as well as sotalol (break through).  - In a fib RVR on admission. Patient started to get symptomatic so elected to proceed with emergent DCCV. DCCV x1 in ED>ST.   - Continue apixaban 5 mg bid. No bleeding issues.    CAD - s/p CABG in 2014. Cath in 7/24 showed patent grafts.  - No chest pain.  - HsTrop 62>64, suspect in the setting of volume overload - Continue ASA/statin   AKI  - Baseline SCr 0.8 - Up to 1.29 on admission, suspect d/t low output HF - 1.1 today.  - avoid hypotension  Elevated liver enzymes - AST 439>630 - ALT 515>791 - suspect 2/2 low out put HF   Fatigue/malaise - suspect from low output HF   Mitral regurgitation - TEE (7/24) with severe MR with restricted posterior leaflet and small flail segment in posterior leaflet.  Suspect primarily infarct-related MR.  Suspect MR is playing a significant role in his symptomatology.  - Not surgical candidate.  Followed by Dr. Excell Seltzer. Being evaluated OP for possible mTEER   PVCs - Continue mexiletine   PAD - S/p aortobifemoral bypass 2014 then SFA stent 2022. Followed by Dr. Chestine Spore. Had left foot ulcerations that have healed.    Hyperkalemia - 5.5 on admission.  - Down to 3.7 today, will replete with diuresis   Length of Stay: 0  Alen Bleacher, NP  07/01/2023, 8:36 AM  Advanced Heart Failure Team Pager [phone] (M-F; 7a - 5p)  Please contact [facility] for night-coverage after hours (5p -7a ) and weekends on [email or web address]   Patient seen with NP, agree with the above note.   He feels much better today after DCCV.  He remains in NSR.  Good diuresis,  weight down.  Lactate trending down now that he is back in NSR.   General: NAD Neck:JVP 7-8 cm, no thyromegaly or thyroid nodule.  Lungs: Clear to auscultation bilaterally with normal respiratory effort. CV: Nondisplaced PMI.  Heart regular S1/S2, no S3/S4, 3/6 HSM apex.  No peripheral edema.   Abdomen: Soft, nontender, no hepatosplenomegaly, no distention.  Skin: Intact without lesions or rashes.  Neurologic: Alert and oriented x 3.  Psych: Normal affect. Extremities: No clubbing or cyanosis.  HEENT: Normal.     1. Acute on chronic systolic CHF: Ischemic cardiomyopathy.  St Jude CRT-D device.  Last study was a TEE in 7/24 showing EF 30-35%, severe MR with restricted posterior leaflet and small flail segment in posterior leaflet, RV normal, mild AI. 7/24 RHC showing elevated filling pressures and low cardiac output.  Patient is quite frail, NYHA class III symptoms at baseline.  Limited to activity around the house.  I suspect he is limited by advanced heart failure/volume overload worsened by ischemic mitral regurgitation as well as significant deconditioning. He was admitted yesterday with afib/RVR and CHF exacerbation, improved with DCCV and diuresis.  Today, in NSR and looks near euvolemic. Suspect AF triggered CHF.  - Continue digoxin  - Given dose of Lasix 80 mg IV x 1.  Start Lasix 40 mg daily tomorrow (was on 20 mg daily at home).  - Restart Jardiance. - Restart spironolactone 12.5 mg daily.  - Mitral regurgitation is severe, hopefully mTEER will help him symptomatically.  - Lactate better today, will repeat in am.  2. Mitral regurgitation: TEE (7/24) with severe MR with restricted posterior leaflet and small flail segment in posterior leaflet.  Suspect primarily infarct-related MR.  I suspect MR is playing a significant role in his symptomatology.  - Not surgical candidate. Though he is frail, would recommend proceeding with mTEER. He has follow up with Dr. Excell Seltzer to discuss. Will see if  this can be expedited.  3. Atrial fibrillation: Paroxysmal. Has failed amiodarone (lung toxicity) as well as sotalol (break through). Admitted with AF/RVR likely triggering CHF exacerbation.  - No anti-arrhythmic option, tolerates AF poorly.  Hopefully treatment of MR will help keep him in NSR more effectively.  - Continue apixaban 5 mg bid. No bleeding issues.  4. PVCs: Frequent.  Continue mexiletine.  5. PAD: S/p aortobifemoral bypass 2014 then SFA  stent 2022.  Followed by Dr. Chestine Spore.  Had left foot ulcerations that have healed.  6. CAD: s/p CABG in 2014. Cath in 7/24 showed patent grafts.  No chest pain.  - No ASA given apixaban use.  - Continue atorvastatin.   Marca Ancona 07/01/2023 2:10 PM

## 2023-07-01 NOTE — Progress Notes (Signed)
Mobility Specialist Progress Note:    07/01/23 1619  Mobility  Activity Ambulated with assistance in room  Level of Assistance Moderate assist, patient does 50-74%  Assistive Device Front wheel walker  Distance Ambulated (ft) 12 ft  Activity Response Tolerated poorly  Mobility Referral Yes  $Mobility charge 1 Mobility  Mobility Specialist Start Time (ACUTE ONLY) 1500  Mobility Specialist Stop Time (ACUTE ONLY) 1520  Mobility Specialist Time Calculation (min) (ACUTE ONLY) 20 min   Pt received in bed agreeable to mobility. Pt needed MinA w/bed mobility and MinA to stand. Pt was able to take a couple of steps before impulsively throwing themself into chair. Pt took a seated break before trying to stand again. Needed x3 attempts to stand before mobility called for nurse assistance. Upon standing pt was able to take a couple of steps before experiencing knee buckling. Was able to get back to bed with ModA +2. All needs met w/ call bell in reach.  Thompson Grayer Mobility Specialist  Please contact vis Secure Chat or  Rehab Office [phone]

## 2023-07-01 NOTE — TOC Initial Note (Addendum)
Transition of Care [facility]) - Initial/Assessment Note    Patient Details  Name: [name] [name] MRN: [id number] Date of Birth: 11/02/1945  [facility] CM/SW Contact:    Elliot Cousin, RN Phone Number: [phone] 07/01/2023, 4:23 PM  Clinical Narrative:  HF TOC CM spoke to pt, wife and dtr at bedside. Offered choice for [facility] (medicare list provided and placed on chart). Pt agreeable to [facility]. Contacted HH rep, Eber Jones with new referral. Pt has RW with seat, scooter and scale to do daily weights.  Dtr will provide transportation home. Will need meds up from [facility] pharmacy at dc.                 Expected Discharge Plan: Home w Home Health Services Barriers to Discharge: Continued Medical Work up   Patient Goals and CMS Choice Patient states their goals for this hospitalization and ongoing recovery are:: wants to be able to walk and get around [facility] Medicare.gov Compare Post Acute Care list provided to:: Patient Represenative (must comment) Choice offered to / list presented to : Spouse      Expected Discharge Plan and Services   Discharge Planning Services: CM Consult Post Acute Care Choice: Home Health Living arrangements for the past 2 months: Single Family Home                           HH Arranged: RN, PT [facility] Agency: [facility] Date [facility] Agency Contacted: 07/01/23 Time HH Agency Contacted: 1622 Representative spoke with at [facility] Agency: Rhunette Croft  Prior Living Arrangements/Services Living arrangements for the past 2 months: Single Family Home Lives with:: Spouse Patient language and need for interpreter reviewed:: Yes Do you feel safe going back to the place where you live?: Yes      Need for Family Participation in Patient Care: Yes (Comment) Care giver support system in place?: Yes (comment) Current home services: DME (rollator, scooter, scale) Criminal Activity/Legal Involvement Pertinent to Current  Situation/Hospitalization: No - Comment as needed  Activities of Daily Living   ADL Screening (condition at time of admission) Independently performs ADLs?: Yes (appropriate for developmental age) Is the patient deaf or have difficulty hearing?: Yes Does the patient have difficulty seeing, even when wearing glasses/contacts?: Yes (macular degeneration) Does the patient have difficulty concentrating, remembering, or making decisions?: No  Permission Sought/Granted Permission sought to share information with : Case Manager, Family Supports, PCP Permission granted to share information with : Yes, Verbal Permission Granted  Share Information with NAME: Gabino Hagin  Permission granted to share info w AGENCY: Home Health  Permission granted to share info w Relationship: wife  Permission granted to share info w Contact Information: [phone]  Emotional Assessment Appearance:: Appears stated age Attitude/Demeanor/Rapport: Engaged Affect (typically observed): Appropriate Orientation: : Oriented to Self, Oriented to Place, Oriented to  Time, Oriented to Situation Alcohol / Substance Use: Not Applicable Psych Involvement: No (comment)  Admission diagnosis:  Hyponatremia [E87.1] Acute exacerbation of CHF (congestive heart failure) (HCC) [I50.9] AKI (acute kidney injury) (HCC) [N17.9] Acute heart failure with reduced ejection fraction (HFrEF, <= 40%) (HCC) [I50.21] Patient Active Problem List   Diagnosis Date Noted   Acute heart failure with reduced ejection fraction (HFrEF, <= 40%) (HCC) 07/01/2023   Acute exacerbation of CHF (congestive heart failure) (HCC) 06/30/2023   Nonrheumatic mitral valve regurgitation 03/25/2023   Atrial fibrillation with RVR (HCC) 02/17/2023  Holosystolic murmur 08/16/2022   Fatigue 08/16/2022   Malnutrition of mild degree (HCC) 08/16/2022   Myocardial infarction [facility])    Critical lower limb ischemia (HCC) 05/05/2021   Chronic systolic heart failure (HCC)  16/06/9603   S/P AAA repair 12/29/2020   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   Paroxysmal atrial fibrillation (HCC) 01/15/2020   Lung nodule, multiple 04/21/2018   Status post aortobifemoral bypass surgery 09/05/2017   Exercise-induced leg fatigue 09/05/2017   COPD without exacerbation (HCC) 11/11/2016   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, but Also Potentially Arrhythmogenic 09/03/2016   Chronic HFrEF (heart failure with reduced ejection fraction) (HCC) 09/03/2016   Hypomagnesemia 09/03/2016   Hyperkalemia - resolved    Ventricular tachycardia (HCC)    Atypical angina (HCC) 09/01/2016   Progressive angina (HCC) - atypical - related to arrhythmia 09/01/2016   Coronary artery disease involving native coronary artery of native heart with angina pectoris (HCC) 09/01/2016   PAD (peripheral artery disease) (HCC)    S/P CABG x 3 10/21/2012   Essential hypertension 03/03/2012   Tobacco use disorder 03/03/2012   Hyponatremia 09/18/2011   Orchitis and epididymitis 09/16/2011   Diabetes mellitus type II, non insulin dependent (HCC) 09/16/2011   Hyperlipidemia associated with type 2 diabetes mellitus (HCC) 09/16/2011   PCP:  Clinic, [facility] Pharmacy:   [facility] - [place], [place] - [address] [place] [address] Phone: [phone] Fax: [phone]  [facility] [address] [place] [address] Phone: [phone] Fax: [phone]     Social Determinants of Health (SDOH) Social History: SDOH Screenings   Food Insecurity: No Food Insecurity (06/30/2023)  Housing: Low Risk  (06/30/2023)  Transportation Needs: No Transportation Needs (06/30/2023)  Utilities: Not At Risk (06/30/2023)  Tobacco Use: High Risk (06/29/2023)   SDOH Interventions:     Readmission Risk Interventions     No data to display

## 2023-07-01 NOTE — Progress Notes (Signed)
PROGRESS NOTE    [name] [name]  WUJ:[id number] DOB: 05/26/1946 DOA: 06/29/2023 PCP: [facility]   77 YO WM with CAD s/p CABG, chronic systolic CHF/ICM w EF 30% with severe mitral regurgitation s/p ICD, PAD w/ aortobifemoral bypass and paroxysmal atrial fibrillation presented to the ED with progressive dyspnea on exertion, decreased appetite, nausea and insomnia, progressively worse but symptoms started about a month ago. -In the ED noted to be in A-fib RVR, sodium 129, potassium 5.5, CO2 19, creatinine 1.29, AST ALT abnormal, BNP 3K, troponin 62, chest x-ray with pulmonary vascular congestion, EKG with A-fib RVR -Heart failure team consulted, 10/10 cardioverted to NSR  Subjective: -Feels much better, breathing is improving  Assessment and Plan:  Acute on chronic systolic CHF, low output, cardiogenic shock -Nonischemic cardiomyopathy, echo 7/24 with EF 30-35%, severe mitral regurgitation -Volume status improving, 4.3 L negative yesterday, transition to oral diuretics tomorrow -Metoprolol dose decreased, starting digoxin  Severe mitral regurgitation -Likely also contributing to CHF, low output -Being evaluated for mitral clip per Dr. Excell Seltzer  Paroxysmal A-fib with RVR -Was noted to be in A-fib RVR on admission, underwent emergent DCCV in the ED 10/10, now in sinus rhythm -Continue low-dose metoprolol, apixaban  CAD/CABG -Stable, no ACS, continue apixaban and statin  AKI Hyperkalemia -Likely cardiorenal -Improving, monitor with Lasix  Abnormal LFTs -Likely from low output, continue to trend -RUQ ultrasound unremarkable  History of PAD -Prior aortobifem bypass 2014, followed by SFA stent 2022 -Follow-up with Dr. Chestine Spore, continue apixaban and statin  DVT prophylaxis: Apixaban Code Status: Full code Family Communication: None present Disposition Plan: Home likely 1 to 2 days  Consultants:    Procedures:   Antimicrobials:    Objective: Vitals:   06/30/23  2359 07/01/23 0000 07/01/23 0446 07/01/23 0730  BP: 115/80  128/81 119/78  Pulse: 98 97 97 97  Resp: 17 14 18 17   Temp: 97.9 F (36.6 C)  97.7 F (36.5 C) 97.9 F (36.6 C)  TempSrc: Oral  Oral Oral  SpO2: 96% 95% 95% 98%  Weight:   54.3 kg   Height:        Intake/Output Summary (Last 24 hours) at 07/01/2023 1002 Last data filed at 07/01/2023 9562 Gross per 24 hour  Intake 120 ml  Output 4325 ml  Net -4205 ml   Filed Weights   06/30/23 1651 06/30/23 1756 07/01/23 0446  Weight: 57 kg 55.7 kg 54.3 kg    Examination:  General exam: Chronically ill elderly male sitting up in the recliner, AAO x 2, mild cognitive deficits HEENT: Positive JVD CVS: S1-S2, regular rhythm, systolic murmur Abdomen: Soft, nontender, bowel sounds present Remedies: 1+ edema, Unna boots Psychiatry:  Mood & affect appropriate.     Data Reviewed:   CBC: Recent Labs  Lab 06/29/23 2338 07/01/23 0456  WBC 9.6 7.5  HGB 12.6* 12.4*  HCT 38.8* 37.9*  MCV 81.3 79.3*  PLT 284 237   Basic Metabolic Panel: Recent Labs  Lab 06/29/23 2338 06/30/23 1007 07/01/23 0456  NA 126* 129* 129*  K 5.3* 5.5* 3.7  CL 90* 95* 88*  CO2 21* 19* 23  GLUCOSE 240* 183* 127*  BUN 40* 42* 38*  CREATININE 1.26* 1.29* 1.10  CALCIUM 9.0 9.0 8.6*  MG  --   --  2.2   GFR: Estimated Creatinine Clearance: 44.6 mL/min (by C-G formula based on SCr of 1.1 mg/dL). Liver Function Tests: Recent Labs  Lab 06/29/23 2338 06/30/23 1007 07/01/23 0456  AST 250* 439*  630*  ALT 286* 515* 781*  ALKPHOS 91 87 90  BILITOT 0.9 0.9 1.0  PROT 6.7 6.2* 6.5  ALBUMIN 3.8 3.7 3.8   No results for input(s): "LIPASE", "AMYLASE" in the last 168 hours. No results for input(s): "AMMONIA" in the last 168 hours. Coagulation Profile: Recent Labs  Lab 06/30/23 0947  INR 2.0*   Cardiac Enzymes: No results for input(s): "CKTOTAL", "CKMB", "CKMBINDEX", "TROPONINI" in the last 168 hours. BNP (last 3 results) No results for input(s):  "PROBNP" in the last 8760 hours. HbA1C: No results for input(s): "HGBA1C" in the last 72 hours. CBG: Recent Labs  Lab 06/30/23 0750 06/30/23 1205 06/30/23 1709 06/30/23 2225 07/01/23 0617  GLUCAP 194* 201* 137* 132* 111*   Lipid Profile: No results for input(s): "CHOL", "HDL", "LDLCALC", "TRIG", "CHOLHDL", "LDLDIRECT" in the last 72 hours. Thyroid Function Tests: No results for input(s): "TSH", "T4TOTAL", "FREET4", "T3FREE", "THYROIDAB" in the last 72 hours. Anemia Panel: No results for input(s): "VITAMINB12", "FOLATE", "FERRITIN", "TIBC", "IRON", "RETICCTPCT" in the last 72 hours. Urine analysis:    Component Value Date/Time   COLORURINE YELLOW 06/30/2023 0947   APPEARANCEUR CLEAR 06/30/2023 0947   LABSPEC 1.021 06/30/2023 0947   PHURINE 5.0 06/30/2023 0947   GLUCOSEU >=500 (A) 06/30/2023 0947   HGBUR NEGATIVE 06/30/2023 0947   BILIRUBINUR NEGATIVE 06/30/2023 0947   KETONESUR 5 (A) 06/30/2023 0947   PROTEINUR 30 (A) 06/30/2023 0947   UROBILINOGEN 0.2 03/03/2012 1702   NITRITE NEGATIVE 06/30/2023 0947   LEUKOCYTESUR NEGATIVE 06/30/2023 0947   Sepsis Labs: @LABRCNTIP (procalcitonin:4,lacticidven:4)  ) Recent Results (from the past 240 hour(s))  Resp panel by RT-PCR (RSV, Flu A&B, Covid) Anterior Nasal Swab     Status: None   Collection Time: 06/30/23  8:08 AM   Specimen: Anterior Nasal Swab  Result Value Ref Range Status   SARS Coronavirus 2 by RT PCR NEGATIVE NEGATIVE Final   Influenza A by PCR NEGATIVE NEGATIVE Final   Influenza B by PCR NEGATIVE NEGATIVE Final    Comment: (NOTE) The Xpert Xpress SARS-CoV-2/FLU/RSV plus assay is intended as an aid in the diagnosis of influenza from Nasopharyngeal swab specimens and should not be used as a sole basis for treatment. Nasal washings and aspirates are unacceptable for Xpert Xpress SARS-CoV-2/FLU/RSV testing.  Fact Sheet for Patients: BloggerCourse.com  Fact Sheet for Healthcare  Providers: SeriousBroker.it  This test is not yet approved or cleared by the [place] FDA and has been authorized for detection and/or diagnosis of SARS-CoV-2 by FDA under an Emergency Use Authorization (EUA). This EUA will remain in effect (meaning this test can be used) for the duration of the COVID-19 declaration under Section 564(b)(1) of the Act, 21 U.S.C. section 360bbb-3(b)(1), unless the authorization is terminated or revoked.     Resp Syncytial Virus by PCR NEGATIVE NEGATIVE Final    Comment: (NOTE) Fact Sheet for Patients: BloggerCourse.com  Fact Sheet for Healthcare Providers: SeriousBroker.it  This test is not yet approved or cleared by the [place] FDA and has been authorized for detection and/or diagnosis of SARS-CoV-2 by FDA under an Emergency Use Authorization (EUA). This EUA will remain in effect (meaning this test can be used) for the duration of the COVID-19 declaration under Section 564(b)(1) of the Act, 21 U.S.C. section 360bbb-3(b)(1), unless the authorization is terminated or revoked.  Performed at [facility], [address] [place], [place] [address]      Radiology Studies: US Abdomen Limited RUQ (LIVER/GB)  Result Date: 06/30/2023 CLINICAL DATA:  Elevated liver enzymes. EXAM: ULTRASOUND ABDOMEN LIMITED RIGHT UPPER QUADRANT COMPARISON:  Abdominal ultrasound dated 03/02/2012. FINDINGS: Gallbladder: Surgically absent.  No sonographic Murphy sign noted by sonographer. Common bile duct: Diameter: 5 mm Liver: No focal lesion identified. Within normal limits in parenchymal echogenicity. Portal vein is patent on color Doppler imaging with normal direction of blood flow towards the liver. Other: None. IMPRESSION: No biliary dilatation. Electronically Signed   By: Romona Curls M.D.   On: 06/30/2023 15:32   DG Chest 2 View  Result Date: 06/30/2023 CLINICAL DATA:  sob  EXAM: CHEST - 2 VIEW COMPARISON:  02/17/2023. FINDINGS: Mild pulmonary vascular congestion. Bilateral lung fields are otherwise clear. No dense consolidation or lung collapse. No pneumothorax. Bilateral costophrenic angles are clear. Stable cardio-mediastinal silhouette. There is a left sided 3-lead pacemaker. Redemonstration of multiple sternal/anterior rib ORIF hardware is. No acute osseous abnormalities. The soft tissues are within normal limits. IMPRESSION: 1. Mild pulmonary vascular congestion. Electronically Signed   By: Jules Schick M.D.   On: 06/30/2023 09:33     Scheduled Meds:  apixaban  5 mg Oral BID   atorvastatin  40 mg Oral Daily   digoxin  0.125 mg Oral Daily   feeding supplement  237 mL Oral BID BM   [START ON 07/02/2023] furosemide  40 mg Oral Daily   insulin aspart  0-5 Units Subcutaneous QHS   insulin aspart  0-9 Units Subcutaneous TID WC   metoprolol succinate  12.5 mg Oral Daily   mexiletine  150 mg Oral Q12H   nicotine  21 mg Transdermal Daily   pantoprazole  40 mg Oral Daily   Continuous Infusions:   LOS: 0 days    Time spent:    Zannie Cove, MD [facility]   07/01/2023, 10:02 AM

## 2023-07-01 NOTE — Progress Notes (Signed)
The  patient just had 40 beats of multifocal PVC's. He was asymptomatic when I checked on him. Notified Dr. Janalyn Shy without any new order but to keep an eye on him. Will continue  to monitor.

## 2023-07-01 NOTE — TOC Initial Note (Signed)
Transition of Care [facility]) - Initial/Assessment Note    Patient Details  Name: [name] [name] MRN: [id number] Date of Birth: 1946-04-11  Transition of Care [facility]) CM/SW Contact:    Nicanor Bake Phone Number: [phone] 07/01/2023, 12:15 PM  Clinical Narrative:     HF CSW met with pt and wife at bedside. Pt and wife live alone. Pt stated that he has a history of HH services from a year ago. Pt stated that he uses an Art gallery manager and walker. Pt stated that he has a scale at home. CSW explained that a hospital follow up appointment will be scheduled closer to dc.   CSW called the [facility] to notify that the pt was hospitalized within 72 hour timeframe. Notification number [phone]  CSW called to schedule pt a follow up hospital appointment for pt. Contacted the [facility] and spoke with someone from the call center who will call back to schedule appointment.   TOC will continue following.               Expected Discharge Plan: Home/Self Care Barriers to Discharge: Continued Medical Work up   Patient Goals and CMS Choice            Expected Discharge Plan and Services       Living arrangements for the past 2 months: Single Family Home                                      Prior Living Arrangements/Services Living arrangements for the past 2 months: Single Family Home Lives with:: Spouse Patient language and need for interpreter reviewed:: Yes Do you feel safe going back to the place where you live?: Yes      Need for Family Participation in Patient Care: Yes (Comment) Care giver support system in place?: Yes (comment)   Criminal Activity/Legal Involvement Pertinent to Current Situation/Hospitalization: No - Comment as needed  Activities of Daily Living   ADL Screening (condition at time of admission) Independently performs ADLs?: Yes (appropriate for developmental age) Is the patient deaf or have difficulty hearing?:  Yes Does the patient have difficulty seeing, even when wearing glasses/contacts?: Yes (macular degeneration) Does the patient have difficulty concentrating, remembering, or making decisions?: No  Permission Sought/Granted                  Emotional Assessment Appearance:: Appears stated age Attitude/Demeanor/Rapport: Engaged Affect (typically observed): Appropriate Orientation: : Oriented to Self, Oriented to Place, Oriented to  Time, Oriented to Situation Alcohol / Substance Use: Not Applicable Psych Involvement: No (comment)  Admission diagnosis:  Hyponatremia [E87.1] Acute exacerbation of CHF (congestive heart failure) (HCC) [I50.9] AKI (acute kidney injury) (HCC) [N17.9] Acute heart failure with reduced ejection fraction (HFrEF, <= 40%) (HCC) [I50.21] Patient Active Problem List   Diagnosis Date Noted   Acute exacerbation of CHF (congestive heart failure) (HCC) 06/30/2023   Nonrheumatic mitral valve regurgitation 03/25/2023   Atrial fibrillation with RVR (HCC) 02/17/2023   Holosystolic murmur 08/16/2022   Fatigue 08/16/2022   Malnutrition of mild degree (HCC) 08/16/2022   Myocardial infarction [facility])    Critical lower limb ischemia (HCC) 05/05/2021   Chronic systolic heart failure (HCC) 02/05/2021   S/P AAA repair 12/29/2020   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   Paroxysmal atrial fibrillation (HCC) 01/15/2020   Lung nodule, multiple 04/21/2018   Status post aortobifemoral bypass  surgery 09/05/2017   Exercise-induced leg fatigue 09/05/2017   COPD without exacerbation (HCC) 11/11/2016   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, but Also Potentially Arrhythmogenic 09/03/2016   Chronic HFrEF (heart failure with reduced ejection fraction) (HCC) 09/03/2016   Hypomagnesemia 09/03/2016   Hyperkalemia - resolved    Ventricular tachycardia (HCC)    Atypical angina (HCC) 09/01/2016   Progressive angina (HCC) - atypical - related  to arrhythmia 09/01/2016   Coronary artery disease involving native coronary artery of native heart with angina pectoris (HCC) 09/01/2016   PAD (peripheral artery disease) (HCC)    S/P CABG x 3 10/21/2012   Essential hypertension 03/03/2012   Tobacco use disorder 03/03/2012   Hyponatremia 09/18/2011   Orchitis and epididymitis 09/16/2011   Diabetes mellitus type II, non insulin dependent (HCC) 09/16/2011   Hyperlipidemia associated with type 2 diabetes mellitus (HCC) 09/16/2011   PCP:  [facility] Pharmacy:   [facility] - [place], [place] - [address] [place] [address] Phone: [phone] Fax: [phone]  [facility] [address] [place] [address] Phone: [phone] Fax: [phone]     Social Determinants of Health (SDOH) Social History: SDOH Screenings   Food Insecurity: No Food Insecurity (06/30/2023)  Housing: Low Risk  (06/30/2023)  Transportation Needs: No Transportation Needs (06/30/2023)  Utilities: Not At Risk (06/30/2023)  Tobacco Use: High Risk (06/29/2023)   SDOH Interventions:     Readmission Risk Interventions     No data to display

## 2023-07-01 NOTE — TOC Transition Note (Signed)
Transition of Care [facility]) - CM/SW Discharge Note   Patient Details  Name: [name] [name] MRN: [id number] Date of Birth: 1946-04-23  Transition of Care [facility]) CM/SW Contact:  Nicanor Bake Phone Number: [phone] 07/01/2023, 3:13 PM   Clinical Narrative:   HF CSW received a call from the [place]. Pts hospital follow up appointment has been scheduled for Tuesday, August 01, 2023 at 11:30 AM.   TOC will continue following.       Barriers to Discharge: Continued Medical Work up   Patient Goals and CMS Choice      Discharge Placement                         Discharge Plan and Services Additional resources added to the After Visit Summary for                                       Social Determinants of Health (SDOH) Interventions SDOH Screenings   Food Insecurity: No Food Insecurity (06/30/2023)  Housing: Low Risk  (06/30/2023)  Transportation Needs: No Transportation Needs (06/30/2023)  Utilities: Not At Risk (06/30/2023)  Tobacco Use: High Risk (06/29/2023)     Readmission Risk Interventions     No data to display

## 2023-07-01 NOTE — Evaluation (Signed)
Physical Therapy Evaluation Patient Details Name: [name] [name] MRN: [id number] DOB: [birth year] Today's Date: 07/01/2023  History of Present Illness  Pt is a 77 y.o. male admitted 10/9 for acute exacerbation of CHF. He underwent cardioversion 10/10. PMH:  DM2, HTN, HLD, CAD/MI, ischemic cardiomyopathy, chronic systolic CHF, V. tach, s/p BiV-ICD, severe MR, PAD s/p bypass, stent GERD, stomach ulcers, chronic anemia, COPD, gout,, peripheral neuropathy, PTSD   Clinical Impression  Pt admitted with above diagnosis. PTA pt lived at home with wife, amb very short household distances with RW and using electric scooter in the community. He reports frequent falls and progressive weakness over last several weeks.  Pt currently with functional limitations due to the deficits listed below (see PT Problem List). On eval, he required supervision bed mobility, CGA transfers, and CGA amb 12' with RW. Unsteady gait with flexed posture. Pt will benefit from acute skilled PT to increase their independence and safety with mobility to allow discharge.  At d/c, pt would benefit from HHPT.         If plan is discharge home, recommend the following: A little help with walking and/or transfers;A little help with bathing/dressing/bathroom;Assist for transportation;Assistance with cooking/housework;Help with stairs or ramp for entrance   Can travel by private vehicle        Equipment Recommendations None recommended by PT  Recommendations for Other Services       Functional Status Assessment Patient has had a recent decline in their functional status and demonstrates the ability to make significant improvements in function in a reasonable and predictable amount of time.     Precautions / Restrictions Precautions Precautions: Fall;Other (comment) Precaution Comments: bilat unna boots      Mobility  Bed Mobility Overal bed mobility: Needs Assistance Bed Mobility: Supine to Sit     Supine to sit:  Supervision, HOB elevated, Used rails     General bed mobility comments: increased time    Transfers Overall transfer level: Needs assistance Equipment used: Rolling walker (2 wheels) Transfers: Sit to/from Stand Sit to Stand: Contact guard assist                Ambulation/Gait Ambulation/Gait assistance: Contact guard assist Gait Distance (Feet): 12 Feet Assistive device: Rolling walker (2 wheels) Gait Pattern/deviations: Decreased step length - left, Step-to pattern, Trunk flexed, Shuffle Gait velocity: decreased Gait velocity interpretation: <1.8 ft/sec, indicate of risk for recurrent falls   General Gait Details: LLE lag during gait. Cues to stay inside frame of RW  Stairs            Wheelchair Mobility     Tilt Bed    Modified Rankin (Stroke Patients Only)       Balance Overall balance assessment: Needs assistance Sitting-balance support: Feet supported, No upper extremity supported Sitting balance-Leahy Scale: Fair     Standing balance support: Bilateral upper extremity supported, During functional activity, Reliant on assistive device for balance Standing balance-Leahy Scale: Poor                               Pertinent Vitals/Pain Pain Assessment Pain Assessment: No/denies pain    Home Living Family/patient expects to be discharged to:: Private residence Living Arrangements: Spouse/significant other Available Help at Discharge: Family;Available 24 hours/day Type of Home: House Home Access: Ramped entrance       Home Layout: Able to live on main level with bedroom/bathroom Home Equipment: Rollator (4 wheels);Rolling Walker (2  wheels);Electric scooter;Cane - single point      Prior Function Prior Level of Function : Needs assist             Mobility Comments: ambulates sort distances in home with RW. Reports frequent falls. Electric scooter in community. ADLs Comments: Wife assists as needed, primarily with LB  dressing and bathing.     Extremity/Trunk Assessment   Upper Extremity Assessment Upper Extremity Assessment: Generalized weakness    Lower Extremity Assessment Lower Extremity Assessment: Generalized weakness    Cervical / Trunk Assessment Cervical / Trunk Assessment: Kyphotic (severe kyphosis/scoliosis)  Communication   Communication Communication: No apparent difficulties  Cognition Arousal: Alert Behavior During Therapy: WFL for tasks assessed/performed Overall Cognitive Status: No family/caregiver present to determine baseline cognitive functioning Area of Impairment: Attention, Memory, Safety/judgement, Awareness, Problem solving                   Current Attention Level: Selective Memory: Decreased short-term memory   Safety/Judgement: Decreased awareness of safety, Decreased awareness of deficits Awareness: Emergent Problem Solving: Difficulty sequencing, Requires verbal cues          General Comments General comments (skin integrity, edema, etc.): HR in 100s. SpO2 stable on RA    Exercises     Assessment/Plan    PT Assessment Patient needs continued PT services  PT Problem List Decreased strength;Decreased balance;Decreased cognition;Decreased mobility;Decreased activity tolerance;Decreased safety awareness       PT Treatment Interventions Functional mobility training;Balance training;Patient/family education;Gait training;Therapeutic activities;Therapeutic exercise    PT Goals (Current goals can be found in the Care Plan section)  Acute Rehab PT Goals Patient Stated Goal: get stronger PT Goal Formulation: With patient Time For Goal Achievement: 07/15/23 Potential to Achieve Goals: Good    Frequency Min 1X/week     Co-evaluation               AM-PAC PT "6 Clicks" Mobility  Outcome Measure Help needed turning from your back to your side while in a flat bed without using bedrails?: A Little Help needed moving from lying on your back  to sitting on the side of a flat bed without using bedrails?: A Little Help needed moving to and from a bed to a chair (including a wheelchair)?: A Little Help needed standing up from a chair using your arms (e.g., wheelchair or bedside chair)?: A Little Help needed to walk in hospital room?: A Little Help needed climbing 3-5 steps with a railing? : A Lot 6 Click Score: 17    End of Session Equipment Utilized During Treatment: Gait belt Activity Tolerance: Patient tolerated treatment well Patient left: in chair;with call bell/phone within reach;with chair alarm set Nurse Communication: Mobility status PT Visit Diagnosis: Muscle weakness (generalized) (M62.81);Difficulty in walking, not elsewhere classified (R26.2);Unsteadiness on feet (R26.81)    Time: 7846-9629 PT Time Calculation (min) (ACUTE ONLY): 26 min   Charges:   PT Evaluation $PT Eval Moderate Complexity: 1 Mod PT Treatments $Gait Training: 8-22 mins PT General Charges $$ ACUTE PT VISIT: 1 Visit         Ferd Glassing., PT  Office # [phone]   Ilda Foil 07/01/2023, 8:39 AM

## 2023-07-01 NOTE — Telephone Encounter (Signed)
Ok with dose increase

## 2023-07-01 NOTE — Plan of Care (Signed)
  Problem: Activity: Goal: Ability to return to baseline activity level will improve Outcome: Progressing   Problem: Coping: Goal: Ability to adjust to condition or change in health will improve Outcome: Progressing   Problem: Health Behavior/Discharge Planning: Goal: Ability to manage health-related needs will improve Outcome: Progressing   Problem: Metabolic: Goal: Ability to maintain appropriate glucose levels will improve Outcome: Progressing   Problem: Nutritional: Goal: Progress toward achieving an optimal weight will improve Outcome: Progressing   Problem: Skin Integrity: Goal: Risk for impaired skin integrity will decrease Outcome: Progressing   Problem: Education: Goal: Knowledge of General Education information will improve Description: Including pain rating scale, medication(s)/side effects and non-pharmacologic comfort measures Outcome: Progressing   Problem: Clinical Measurements: Goal: Ability to maintain clinical measurements within normal limits will improve Outcome: Progressing   Problem: Elimination: Goal: Will not experience complications related to urinary retention Outcome: Progressing   Problem: Pain Managment: Goal: General experience of comfort will improve Outcome: Progressing   Problem: Activity: Goal: Risk for activity intolerance will decrease Outcome: Not Progressing

## 2023-07-01 NOTE — Care Management Obs Status (Signed)
MEDICARE OBSERVATION STATUS NOTIFICATION   Patient Details  Name: [name] [name] MRN: [id number] Date of Birth: 1945/11/21   Medicare Observation Status Notification Given:  Yes    Elliot Cousin, RN 07/01/2023, 12:31 PM

## 2023-07-02 ENCOUNTER — Other Ambulatory Visit (HOSPITAL_COMMUNITY): Payer: Self-pay

## 2023-07-02 DIAGNOSIS — I340 Nonrheumatic mitral (valve) insufficiency: Secondary | ICD-10-CM

## 2023-07-02 DIAGNOSIS — I2510 Atherosclerotic heart disease of native coronary artery without angina pectoris: Secondary | ICD-10-CM | POA: Diagnosis not present

## 2023-07-02 DIAGNOSIS — I5043 Acute on chronic combined systolic (congestive) and diastolic (congestive) heart failure: Secondary | ICD-10-CM

## 2023-07-02 DIAGNOSIS — I5021 Acute systolic (congestive) heart failure: Secondary | ICD-10-CM | POA: Diagnosis not present

## 2023-07-02 DIAGNOSIS — I480 Paroxysmal atrial fibrillation: Secondary | ICD-10-CM | POA: Diagnosis not present

## 2023-07-02 LAB — GLUCOSE, CAPILLARY: Glucose-Capillary: 155 mg/dL — ABNORMAL HIGH (ref 70–99)

## 2023-07-02 LAB — COMPREHENSIVE METABOLIC PANEL
ALT: 667 U/L — ABNORMAL HIGH (ref 0–44)
AST: 405 U/L — ABNORMAL HIGH (ref 15–41)
Albumin: 3.6 g/dL (ref 3.5–5.0)
Alkaline Phosphatase: 85 U/L (ref 38–126)
Anion gap: 16 — ABNORMAL HIGH (ref 5–15)
BUN: 35 mg/dL — ABNORMAL HIGH (ref 8–23)
CO2: 27 mmol/L (ref 22–32)
Calcium: 8.9 mg/dL (ref 8.9–10.3)
Chloride: 87 mmol/L — ABNORMAL LOW (ref 98–111)
Creatinine, Ser: 1.19 mg/dL (ref 0.61–1.24)
GFR, Estimated: >60 mL/min (ref >60)
Glucose, Bld: 153 mg/dL — ABNORMAL HIGH (ref 70–99)
Potassium: 3.7 mmol/L (ref 3.5–5.1)
Sodium: 130 mmol/L — ABNORMAL LOW (ref 135–145)
Total Bilirubin: 1 mg/dL (ref 0.3–1.2)
Total Protein: 6.3 g/dL — ABNORMAL LOW (ref 6.5–8.1)

## 2023-07-02 MED ORDER — FUROSEMIDE 40 MG PO TABS
40.0000 mg | ORAL_TABLET | Freq: Every day | ORAL | 0 refills | Status: DC
  Filled 2023-07-02: qty 30, 30d supply, fill #0

## 2023-07-02 MED ORDER — EMPAGLIFLOZIN 10 MG PO TABS
10.0000 mg | ORAL_TABLET | Freq: Every day | ORAL | 0 refills | Status: DC
  Filled 2023-07-02: qty 30, 30d supply, fill #0

## 2023-07-02 NOTE — Progress Notes (Signed)
Rounding Note    Patient Name: [name] [name] Date of Encounter: 07/02/2023  [facility] Cardiologist: [name] Lemma, MD    Subjective   77 year old gentleman with acute on chronic systolic CHF.  He has severe ischemic mitral regurgitation He has rapid atrial fibrillation and has failed amiodarone due to lung toxicity and also has failed sotalol due to breakthrough atrial fibrillation.  He tolerates atrial fibrillation very poorly.  He has been fairly stable for the past several days.  He has been eating fairly well.  He remains weak.  He is getting up and around in the room.  Had a long discussion with daughter.  She is concerned that he may not be strong enough to go home.  I pointed out that he may need to go to a skilled nursing facility while we wait for the appointment with Dr. Excell [name] to discuss MitraClip.  Inpatient Medications    Scheduled Meds:  apixaban  5 mg Oral BID   atorvastatin  40 mg Oral Daily   digoxin  0.125 mg Oral Daily   empagliflozin  10 mg Oral Daily   feeding supplement  237 mL Oral BID BM   furosemide  40 mg Oral Daily   insulin aspart  0-5 Units Subcutaneous QHS   insulin aspart  0-9 Units Subcutaneous TID WC   metoprolol succinate  12.5 mg Oral Daily   mexiletine  150 mg Oral Q12H   nicotine  21 mg Transdermal Daily   pantoprazole  40 mg Oral Daily   spironolactone  12.5 mg Oral Daily   Continuous Infusions:  PRN Meds: acetaminophen **OR** acetaminophen, albuterol, hydrALAZINE, mouth rinse   Vital Signs    Vitals:   07/02/23 0000 07/02/23 0430 07/02/23 0500 07/02/23 0941  BP: 115/76 119/64    Pulse: 85 73  83  Resp: 12 15  13   Temp: 97.7 F (36.5 C) 97.8 F (36.6 C)    TempSrc: Oral Oral    SpO2: 95% 93%  99%  Weight:   56.9 kg   Height:        Intake/Output Summary (Last 24 hours) at 07/02/2023 0957 Last data filed at 07/02/2023 0810 Gross per 24 hour  Intake 360 ml  Output 3165 ml  Net -2805 ml       07/02/2023    5:00 AM 07/01/2023    4:46 AM 06/30/2023    5:56 PM  Last 3 Weights  Weight (lbs) 125 lb 7.1 oz 119 lb 12.8 oz 122 lb 12.7 oz  Weight (kg) 56.9 kg 54.341 kg 55.7 kg      Telemetry    NSR , V pacing , frequent PVCs  - Personally Reviewed  ECG     - Personally Reviewed  Physical Exam   GEN:  very frail, wasted , elderly man,   NAD  Neck: No JVD Cardiac: RRR, frequent PVCs,   3-4/6 blowing systolic murmur at left ax line  Respiratory: Clear to auscultation bilaterally. GI: Soft, nontender, non-distended  MS: 1-2 + lower leg edema,  erythema ,   Neuro:  Nonfocal  Psych: Normal affect   Labs    High Sensitivity Troponin:   Recent Labs  Lab 06/30/23 0947 06/30/23 1007  TROPONINIHS 62* 64*     Chemistry Recent Labs  Lab 06/30/23 1007 07/01/23 0456 07/02/23 0314  NA 129* 129* 130*  K 5.5* 3.7 3.7  CL 95* 88* 87*  CO2 19* 23 27  GLUCOSE 183* 127* 153*  BUN  42* 38* 35*  CREATININE 1.29* 1.10 1.19  CALCIUM 9.0 8.6* 8.9  MG  --  2.2  --   PROT 6.2* 6.5 6.3*  ALBUMIN 3.7 3.8 3.6  AST 439* 630* 405*  ALT 515* 781* 667*  ALKPHOS 87 90 85  BILITOT 0.9 1.0 1.0  GFRNONAA 58* >60 >60  ANIONGAP 15 18* 16*    Lipids No results for input(s): "CHOL", "TRIG", "HDL", "LABVLDL", "LDLCALC", "CHOLHDL" in the last 168 hours.  Hematology Recent Labs  Lab 06/29/23 2338 07/01/23 0456  WBC 9.6 7.5  RBC 4.77 4.78  HGB 12.6* 12.4*  HCT 38.8* 37.9*  MCV 81.3 79.3*  MCH 26.4 25.9*  MCHC 32.5 32.7  RDW 15.6* 15.3  PLT 284 237   Thyroid No results for input(s): "TSH", "FREET4" in the last 168 hours.  BNP Recent Labs  Lab 06/30/23 0947  BNP 2,925.9*    DDimer No results for input(s): "DDIMER" in the last 168 hours.   Radiology    US Abdomen Limited RUQ (LIVER/GB)  Result Date: 06/30/2023 CLINICAL DATA:  Elevated liver enzymes. EXAM: ULTRASOUND ABDOMEN LIMITED RIGHT UPPER QUADRANT COMPARISON:  Abdominal ultrasound dated 03/02/2012. FINDINGS:  Gallbladder: Surgically absent.  No sonographic Murphy sign noted by sonographer. Common bile duct: Diameter: 5 mm Liver: No focal lesion identified. Within normal limits in parenchymal echogenicity. Portal vein is patent on color Doppler imaging with normal direction of blood flow towards the liver. Other: None. IMPRESSION: No biliary dilatation. Electronically Signed   By: Romona Curls M.D.   On: 06/30/2023 15:32    Cardiac Studies      Patient Profile     77 y.o. male  with flail MR, PAF,    Assessment & Plan    1.  Acute on chronic systolic congestive heart failure: Appears to be fairly well compensated.  He still has some excess volume but is doing quite a bit better.  He has not been able to eat and drink.  He has NYHA class III symptoms at baseline.  Continue current medications.  He is now on Lasix 40 mg a day, spironolactone 12.5 mg a day.  2.  Paroxysmal atrial fibrillation: He has failed amiodarone due to lung toxicity and is also failed sotalol due to breakthrough atrial fibrillation.  Currently remains in normal sinus rhythm.  Continue Eliquis 5 mg twice a day  3.  Frequent premature ventricular contractions: Continue mexiletine.  4.  Peripheral arterial disease: Status post aortobifem bypass.  He is followed by Dr. Chestine [name].  5.  Coronary artery disease: Status post coronary artery bypass grafting.  He denies any chest pain.  6.  General Debility: He will need to be evaluated by physical therapy and Occupational Therapy to determine whether or not he is stable enough to go home. Will defer to Dr. Jomarie [name] for further management of this.       For questions or updates, please contact [name] Please consult [email or web address] for contact info under        Signed, Kristeen Miss, MD  07/02/2023, 9:57 AM

## 2023-07-02 NOTE — Plan of Care (Signed)
Pt informed RN that he is ready to leave; notified Dr. Jomarie Longs of patients request and patient is scheduled to be discharged today following a meeting with the case manager to address family's concerns.  Patient removed his telemetry and IV discontinued per patient's request.

## 2023-07-02 NOTE — Progress Notes (Signed)
Mobility Specialist Progress Note:    07/02/23 1006  Mobility  Activity Ambulated with assistance in room  Level of Assistance Minimal assist, patient does 75% or more  Assistive Device Front wheel walker  Distance Ambulated (ft) 20 ft  Activity Response Tolerated well  Mobility Referral Yes  $Mobility charge 1 Mobility  Mobility Specialist Start Time (ACUTE ONLY) 0850  Mobility Specialist Stop Time (ACUTE ONLY) 0906  Mobility Specialist Time Calculation (min) (ACUTE ONLY) 16 min   Pt received in chair agreeable to mobility. Pt needed MinA to STS and for correcting back posture. Pt was able to ambulate to amd from the BR w/o fault. C/o mild leg weakness otherwise no c/o. Returned to chair w/o fault. All needs met w/ call bell and personal belongings in reach.   Thompson Grayer Mobility Specialist  Please contact vis Secure Chat or  Rehab Office [phone]

## 2023-07-02 NOTE — Progress Notes (Signed)
Per [facility] pharmacy, medication will be ready for pickup in 10-15 minutes.

## 2023-07-02 NOTE — TOC Transition Note (Addendum)
Transition of Care [facility]) - CM/SW Discharge Note   Patient Details  Name: [name] [name] MRN: [id number] Date of Birth: 1946/04/12  [facility]) CM/SW Contact:  Lawerance Sabal, RN Phone Number: 07/02/2023, 11:31 AM   Clinical Narrative:     Nancy Fetter w [facility] health of DC. Meds through [facility] pharmacy. Went to room to speak with daughter, caught nurses on the way out with the patient in the [facility] for DC.  Spoke w daughter on the phone, reviewed with her that [facility] has been set up and will contact in 1-2 days to make first appointment   No other TOC needs identified    Final next level of care: Home w Home Health Services Barriers to Discharge: No Barriers Identified   Patient Goals and CMS Choice CMS Medicare.gov Compare Post Acute Care list provided to:: Patient Represenative (must comment) Choice offered to / list presented to : Spouse  Discharge Placement                         Discharge Plan and Services Additional resources added to the After Visit Summary for     Discharge Planning Services: CM Consult Post Acute Care Choice: Home Health                    HH Arranged: RN, PT [facility] Agency: [facility] Date [facility] Agency Contacted: 07/02/23 Time HH Agency Contacted: 1131 Representative spoke with at [facility] Agency: Eber Jones, [facility]  Social Determinants of Health (SDOH) Interventions SDOH Screenings   Food Insecurity: No Food Insecurity (06/30/2023)  Housing: Low Risk  (06/30/2023)  Transportation Needs: No Transportation Needs (06/30/2023)  Utilities: Not At Risk (06/30/2023)  Tobacco Use: High Risk (06/29/2023)     Readmission Risk Interventions     No data to display

## 2023-07-05 ENCOUNTER — Encounter: Payer: Self-pay | Admitting: Cardiology

## 2023-07-05 DIAGNOSIS — E1151 Type 2 diabetes mellitus with diabetic peripheral angiopathy without gangrene: Secondary | ICD-10-CM | POA: Diagnosis not present

## 2023-07-05 DIAGNOSIS — D649 Anemia, unspecified: Secondary | ICD-10-CM | POA: Diagnosis not present

## 2023-07-05 DIAGNOSIS — I340 Nonrheumatic mitral (valve) insufficiency: Secondary | ICD-10-CM | POA: Diagnosis not present

## 2023-07-05 DIAGNOSIS — I493 Ventricular premature depolarization: Secondary | ICD-10-CM | POA: Diagnosis not present

## 2023-07-05 DIAGNOSIS — M109 Gout, unspecified: Secondary | ICD-10-CM | POA: Diagnosis not present

## 2023-07-05 DIAGNOSIS — I472 Ventricular tachycardia, unspecified: Secondary | ICD-10-CM | POA: Diagnosis not present

## 2023-07-05 DIAGNOSIS — I255 Ischemic cardiomyopathy: Secondary | ICD-10-CM | POA: Diagnosis not present

## 2023-07-05 DIAGNOSIS — I4892 Unspecified atrial flutter: Secondary | ICD-10-CM | POA: Diagnosis not present

## 2023-07-05 DIAGNOSIS — K219 Gastro-esophageal reflux disease without esophagitis: Secondary | ICD-10-CM | POA: Diagnosis not present

## 2023-07-05 DIAGNOSIS — E875 Hyperkalemia: Secondary | ICD-10-CM | POA: Diagnosis not present

## 2023-07-05 DIAGNOSIS — I2510 Atherosclerotic heart disease of native coronary artery without angina pectoris: Secondary | ICD-10-CM | POA: Diagnosis not present

## 2023-07-05 DIAGNOSIS — I110 Hypertensive heart disease with heart failure: Secondary | ICD-10-CM | POA: Diagnosis not present

## 2023-07-05 DIAGNOSIS — Z7984 Long term (current) use of oral hypoglycemic drugs: Secondary | ICD-10-CM | POA: Diagnosis not present

## 2023-07-05 DIAGNOSIS — Z7901 Long term (current) use of anticoagulants: Secondary | ICD-10-CM | POA: Diagnosis not present

## 2023-07-05 DIAGNOSIS — J449 Chronic obstructive pulmonary disease, unspecified: Secondary | ICD-10-CM | POA: Diagnosis not present

## 2023-07-05 DIAGNOSIS — Z7982 Long term (current) use of aspirin: Secondary | ICD-10-CM | POA: Diagnosis not present

## 2023-07-05 DIAGNOSIS — Z951 Presence of aortocoronary bypass graft: Secondary | ICD-10-CM | POA: Diagnosis not present

## 2023-07-05 DIAGNOSIS — E1142 Type 2 diabetes mellitus with diabetic polyneuropathy: Secondary | ICD-10-CM | POA: Diagnosis not present

## 2023-07-05 DIAGNOSIS — E1169 Type 2 diabetes mellitus with other specified complication: Secondary | ICD-10-CM | POA: Diagnosis not present

## 2023-07-05 DIAGNOSIS — E7849 Other hyperlipidemia: Secondary | ICD-10-CM | POA: Diagnosis not present

## 2023-07-05 DIAGNOSIS — I252 Old myocardial infarction: Secondary | ICD-10-CM | POA: Diagnosis not present

## 2023-07-05 DIAGNOSIS — I480 Paroxysmal atrial fibrillation: Secondary | ICD-10-CM | POA: Diagnosis not present

## 2023-07-05 DIAGNOSIS — F4310 Post-traumatic stress disorder, unspecified: Secondary | ICD-10-CM | POA: Diagnosis not present

## 2023-07-05 DIAGNOSIS — N179 Acute kidney failure, unspecified: Secondary | ICD-10-CM | POA: Diagnosis not present

## 2023-07-05 DIAGNOSIS — I5023 Acute on chronic systolic (congestive) heart failure: Secondary | ICD-10-CM | POA: Diagnosis not present

## 2023-07-07 DIAGNOSIS — J449 Chronic obstructive pulmonary disease, unspecified: Secondary | ICD-10-CM | POA: Diagnosis not present

## 2023-07-07 DIAGNOSIS — I2510 Atherosclerotic heart disease of native coronary artery without angina pectoris: Secondary | ICD-10-CM | POA: Diagnosis not present

## 2023-07-07 DIAGNOSIS — I5023 Acute on chronic systolic (congestive) heart failure: Secondary | ICD-10-CM | POA: Diagnosis not present

## 2023-07-07 DIAGNOSIS — E1142 Type 2 diabetes mellitus with diabetic polyneuropathy: Secondary | ICD-10-CM | POA: Diagnosis not present

## 2023-07-07 DIAGNOSIS — I110 Hypertensive heart disease with heart failure: Secondary | ICD-10-CM | POA: Diagnosis not present

## 2023-07-07 DIAGNOSIS — E1151 Type 2 diabetes mellitus with diabetic peripheral angiopathy without gangrene: Secondary | ICD-10-CM | POA: Diagnosis not present

## 2023-07-12 NOTE — Discharge Summary (Signed)
Physician Discharge Summary  [name] [name] OZH:[id number] DOB: [birth year] DOA: 06/29/2023  PCP: [facility]  Admit date: 06/29/2023 Discharge date: 07/02/2023  Time spent: 35 minutes  Recommendations for Outpatient Follow-up:  Cardiology Dr. Herbie Baltimore in 2 weeks Structural heart team Dr. Excell Seltzer in 1 month PCP in 1 week please check BMP at follow-up   Discharge Diagnoses:  Principal Problem:   Acute on chronic systolic CHF Severe mitral regurgitation Severe PAD CAD/CABG Acute kidney injury Hyperkalemia   Atrial fibrillation with RVR (HCC)   Acute heart failure with reduced ejection fraction (HFrEF, <= 40%) (HCC)   Discharge Condition: Improved  Diet recommendation: Low-sodium, heart healthy  Filed Weights   06/30/23 1756 07/01/23 0446 07/02/23 0500  Weight: 55.7 kg 54.3 kg 56.9 kg    History of present illness:  77 YO WM with CAD s/p CABG, chronic systolic CHF/ICM w EF 30% with severe mitral regurgitation s/p ICD, PAD w/ aortobifemoral bypass and paroxysmal atrial fibrillation presented to the ED with progressive dyspnea on exertion, decreased appetite, nausea and insomnia, progressively worse but symptoms started about a month ago. -In the ED noted to be in A-fib RVR, sodium 129, potassium 5.5, CO2 19, creatinine 1.29, AST ALT abnormal, BNP 3K, troponin 62, chest x-ray with pulmonary vascular congestion, EKG with A-fib RVR -Heart failure team consulted, 10/10 cardioverted to [facility] Course:  Acute on chronic systolic CHF, low output, cardiogenic shock -Nonischemic cardiomyopathy, echo 7/24 with EF 30-35%, severe mitral regurgitation -Volume status improving, 5.6 L negative, transitioned to oral diuretics -Metoprolol dose decreased, starting digoxin -Clinically improved and stable, weaned off O2, ambulating in the room, anxious to be discharged today and threatening to leave AMA, discharged home in a stable condition, advised follow-up with Dr. Herbie Baltimore  and then Dr. Excell Seltzer for ongoing mitral clip eval   Severe mitral regurgitation -Likely also contributing to CHF, low output -Being evaluated for mitral clip per Dr. Excell Seltzer   Paroxysmal A-fib with RVR -Was noted to be in A-fib RVR on admission, underwent emergent DCCV in the ED 10/10, now in sinus rhythm -Continue low-dose metoprolol, apixaban   CAD/CABG -Stable, no ACS, continue apixaban and statin   AKI Hyperkalemia -Likely cardiorenal -Improving, monitor with Lasix   Abnormal LFTs -Likely from passive hepatic congestion, improving -RUQ ultrasound unremarkable   History of PAD -Prior aortobifem bypass 2014, followed by SFA stent 2022 -Longstanding problem with chronic symptoms -Follow-up with Dr. Chestine Spore, continue apixaban and statin   Consultations: Cards  Discharge Exam: Vitals:   07/02/23 0941 07/02/23 1032  BP: 121/84   Pulse: 83 92  Resp: 13   Temp:    SpO2: 99%    General exam: Chronically ill elderly male sitting up in the recliner, AAO x 2, mild cognitive deficits HEENT: no JVD CVS: S1-S2, regular rhythm, systolic murmur Abdomen: Soft, nontender, bowel sounds present Extremities: trace edema, Unna boots Psychiatry:  Mood & affect appropriate.   Discharge Instructions   Discharge Instructions     Diet - low sodium heart healthy   Complete by: As directed    Diet Carb Modified   Complete by: As directed    Discharge wound care:   Complete by: As directed    routine   Increase activity slowly   Complete by: As directed       Allergies as of 07/02/2023       Reactions   Lisinopril    Other reaction(s): Cough        Medication  List     STOP taking these medications    dicloxacillin 250 MG capsule Commonly known as: DYNAPEN   naproxen 500 MG tablet Commonly known as: NAPROSYN       TAKE these medications    acetaminophen 650 MG CR tablet Commonly known as: TYLENOL Take 650 mg by mouth every 8 (eight) hours as needed for  pain.   Alpha-Lipoic Acid 600 MG Tabs Take 600 mg by mouth daily.   apixaban 5 MG Tabs tablet Commonly known as: ELIQUIS Take 1 tablet (5 mg total) by mouth 2 (two) times daily.   aspirin EC 81 MG tablet Take 81 mg by mouth daily.   atorvastatin 40 MG tablet Commonly known as: LIPITOR Take by mouth.   cholecalciferol 25 MCG (1000 UNIT) tablet Commonly known as: VITAMIN D3 Take 2,000 Units by mouth every evening.   Cholecalciferol 50 MCG (2000 UT) Tabs Take by mouth.   digoxin 0.125 MG tablet Commonly known as: LANOXIN Take 1 tablet (0.125 mg total) by mouth daily. Monday through Friday only, None on Saturday and Sunday   diphenhydrAMINE 25 mg capsule Commonly known as: BENADRYL Take 25 mg by mouth at bedtime as needed for sleep.   ENSURE PLUS PO Take 1 Can by mouth 2 (two) times daily.   fluticasone 50 MCG/ACT nasal spray Commonly known as: FLONASE Place 2 sprays into both nostrils daily as needed for allergies or rhinitis.   furosemide 40 MG tablet Commonly known as: LASIX Take 1 tablet (40 mg total) by mouth daily. What changed:  medication strength how much to take   glipiZIDE 5 MG tablet Commonly known as: GLUCOTROL Take 5 mg by mouth 2 (two) times daily before a meal.   Jardiance 10 MG Tabs tablet Generic drug: empagliflozin Take 1 tablet (10 mg total) by mouth daily. What changed:  medication strength how much to take how to take this when to take this additional instructions   lidocaine 5 % Commonly known as: LIDODERM Place 1 patch onto the skin daily as needed (For pain).   magnesium oxide 400 (241.3 Mg) MG tablet Commonly known as: MAG-OX Take 1 tablet (400 mg total) by mouth daily.   metFORMIN 1000 MG tablet Commonly known as: GLUCOPHAGE Take 1 tablet (1,000 mg total) by mouth 2 (two) times daily with a meal. Resume on 09/06/2016   metoprolol succinate 25 MG 24 hr tablet Commonly known as: Toprol XL Take 0.5 tablets (12.5 mg total) by  mouth in the morning and at bedtime.   mexiletine 150 MG capsule Commonly known as: MEXITIL Take 1 capsule (150 mg total) by mouth every 12 (twelve) hours.   nitroGLYCERIN 0.4 MG SL tablet Commonly known as: NITROSTAT Place 1 tablet (0.4 mg total) under the tongue every 5 (five) minutes x 3 doses as needed for chest pain.   OVER THE COUNTER MEDICATION Take 1 tablet by mouth See admin instructions. Relaxium every other night as needed   pantoprazole 40 MG tablet Commonly known as: PROTONIX Take 1 tablet (40 mg total) by mouth daily.   Spiriva Respimat 2.5 MCG/ACT Aers Generic drug: Tiotropium Bromide Monohydrate Inhale 2 puffs into the lungs at bedtime.   spironolactone 25 MG tablet Commonly known as: ALDACTONE Take 0.5 tablets (12.5 mg total) by mouth daily.   vitamin A & D ointment Apply 1 Application topically as needed for dry skin (on bottoms).               Discharge Care Instructions  (  From admission, onward)           Start     Ordered   07/02/23 0000  Discharge wound care:       Comments: routine   07/02/23 1056           Allergies  Allergen Reactions   Lisinopril     Other reaction(s): Cough    Follow-up Information     [facility] Go in 30 day(s).   Why: Hospital follow up appointment scheduled for Tuesday, August 01, 2023 at 11:30 AM.  PLEASE ARRIVE 10-15 minutes early.  PLEASE call to reschedule/cancel appointment if you CANNOT amke it. Contact information: [address] [facility] [place] [address] [phone]         Home, Medi Follow up.   Why: Home Health RN, Physical Therapy, Occupational Therapy-agency will call toa arrange appt Contact information: [address] [place] [address] [phone]                  The results of significant diagnostics from this hospitalization (including imaging, microbiology, ancillary and laboratory) are listed below for reference.     Significant Diagnostic Studies: US Abdomen Limited RUQ (LIVER/GB)  Result Date: 06/30/2023 CLINICAL DATA:  Elevated liver enzymes. EXAM: ULTRASOUND ABDOMEN LIMITED RIGHT UPPER QUADRANT COMPARISON:  Abdominal ultrasound dated 03/02/2012. FINDINGS: Gallbladder: Surgically absent.  No sonographic Murphy sign noted by sonographer. Common bile duct: Diameter: 5 mm Liver: No focal lesion identified. Within normal limits in parenchymal echogenicity. Portal vein is patent on color Doppler imaging with normal direction of blood flow towards the liver. Other: None. IMPRESSION: No biliary dilatation. Electronically Signed   By: Romona Curls M.D.   On: 06/30/2023 15:32   DG Chest 2 View  Result Date: 06/30/2023 CLINICAL DATA:  sob EXAM: CHEST - 2 VIEW COMPARISON:  02/17/2023. FINDINGS: Mild pulmonary vascular congestion. Bilateral lung fields are otherwise clear. No dense consolidation or lung collapse. No pneumothorax. Bilateral costophrenic angles are clear. Stable cardio-mediastinal silhouette. There is a left sided 3-lead pacemaker. Redemonstration of multiple sternal/anterior rib ORIF hardware is. No acute osseous abnormalities. The soft tissues are within normal limits. IMPRESSION: 1. Mild pulmonary vascular congestion. Electronically Signed   By: Jules Schick M.D.   On: 06/30/2023 09:33   VAS [place] ABI WITH/WO TBI  Result Date: 06/14/2023  LOWER EXTREMITY DOPPLER STUDY Patient Name:  SHANDELL PAVELICH  Date of Exam:   06/14/2023 Medical Rec #: [id number]        Accession #:    [id number] Date of Birth: 14-Oct-1945         Patient Gender: M Patient Age:   77 years Exam Location:  [facility] Procedure:      VAS [place] ABI WITH/WO TBI Referring Phys: Sherald Hess --------------------------------------------------------------------------------  Indications: Peripheral artery disease. High Risk Factors: Hypertension, hyperlipidemia, Diabetes, current smoker, prior                    MI, coronary  artery disease. Other Factors: S/p CABG x 3, CHF.  Vascular Interventions: 05/13/21 Left femoral endart. Revision Left lib Ao-Bifem                         graft. Left SFA stent                         2014 Aortobifemoral bypass done by [facility]. Performing Technologist: Elita Quick RVT  Examination Guidelines: A complete evaluation includes at minimum, Doppler waveform signals and systolic blood pressure reading at the level of bilateral brachial, anterior tibial, and posterior tibial arteries, when vessel segments are accessible. Bilateral testing is considered an integral part of a complete examination. Photoelectric Plethysmograph (PPG) waveforms and toe systolic pressure readings are included as required and additional duplex testing as needed. Limited examinations for reoccurring indications may be performed as noted.  ABI Findings: +---------+------------------+-----+----------+--------+ Right    Rt Pressure (mmHg)IndexWaveform  Comment  +---------+------------------+-----+----------+--------+ Brachial 114                                       +---------+------------------+-----+----------+--------+ PTA      86                0.74 monophasic         +---------+------------------+-----+----------+--------+ DP       87                0.74 monophasic         +---------+------------------+-----+----------+--------+ Great Toe47                0.40 Abnormal           +---------+------------------+-----+----------+--------+ +---------+------------------+-----+----------+-------+ Left     Lt Pressure (mmHg)IndexWaveform  Comment +---------+------------------+-----+----------+-------+ Brachial 117                                      +---------+------------------+-----+----------+-------+ PTA      109               0.93 biphasic          +---------+------------------+-----+----------+-------+ DP       101               0.86 monophasicbbisk    +---------+------------------+-----+----------+-------+ Great Toe60                0.51 Abnormal          +---------+------------------+-----+----------+-------+ +-------+-----------+-----------+------------+------------+ ABI/TBIToday's ABIToday's TBIPrevious ABIPrevious TBI +-------+-----------+-----------+------------+------------+ Right  0.74       0.40       0.62        0.36         +-------+-----------+-----------+------------+------------+ Left   0.93       0.51       0.90        0.55         +-------+-----------+-----------+------------+------------+  Right ABIs appear increased. Left ABIs and TBIs appear essentially unchanged.  Summary: Right: Resting right ankle-brachial index indicates moderate right lower extremity arterial disease. The right toe-brachial index is abnormal. Left: Resting left ankle-brachial index indicates mild left lower extremity arterial disease. The left toe-brachial index is abnormal. *See table(s) above for measurements and observations.  Electronically signed by Sherald Hess MD on 06/14/2023 at 2:26:21 PM.    Final     Microbiology: No results found for this or any previous visit (from the past 240 hour(s)).   Labs: Basic Metabolic Panel: No results for input(s): "NA", "K", "CL", "CO2", "GLUCOSE", "BUN", "CREATININE", "CALCIUM", "MG", "PHOS" in the last 168 hours. Liver Function Tests: No results for input(s): "AST", "ALT", "ALKPHOS", "BILITOT", "PROT", "ALBUMIN" in the last 168 hours. No results for input(s): "LIPASE", "AMYLASE" in the last 168 hours. No results for input(s): "AMMONIA" in the last 168 hours.  CBC: No results for input(s): "WBC", "NEUTROABS", "HGB", "HCT", "MCV", "PLT" in the last 168 hours. Cardiac Enzymes: No results for input(s): "CKTOTAL", "CKMB", "CKMBINDEX", "TROPONINI" in the last 168 hours. BNP: BNP (last 3 results) Recent Labs    05/02/23 1540 06/13/23 1616 06/30/23 0947  BNP 616.1* 561.0* 2,925.9*     ProBNP (last 3 results) No results for input(s): "PROBNP" in the last 8760 hours.  CBG: No results for input(s): "GLUCAP" in the last 168 hours.     Signed:  Zannie Cove MD.  [facility] 07/12/2023, 8:10 AM

## 2023-07-13 DIAGNOSIS — E1151 Type 2 diabetes mellitus with diabetic peripheral angiopathy without gangrene: Secondary | ICD-10-CM | POA: Diagnosis not present

## 2023-07-13 DIAGNOSIS — I2510 Atherosclerotic heart disease of native coronary artery without angina pectoris: Secondary | ICD-10-CM | POA: Diagnosis not present

## 2023-07-13 DIAGNOSIS — J449 Chronic obstructive pulmonary disease, unspecified: Secondary | ICD-10-CM | POA: Diagnosis not present

## 2023-07-13 DIAGNOSIS — E1142 Type 2 diabetes mellitus with diabetic polyneuropathy: Secondary | ICD-10-CM | POA: Diagnosis not present

## 2023-07-13 DIAGNOSIS — I5023 Acute on chronic systolic (congestive) heart failure: Secondary | ICD-10-CM | POA: Diagnosis not present

## 2023-07-13 DIAGNOSIS — I110 Hypertensive heart disease with heart failure: Secondary | ICD-10-CM | POA: Diagnosis not present

## 2023-07-14 ENCOUNTER — Encounter (HOSPITAL_COMMUNITY): Payer: Self-pay

## 2023-07-14 ENCOUNTER — Ambulatory Visit (HOSPITAL_COMMUNITY)
Admit: 2023-07-14 | Discharge: 2023-07-14 | Disposition: A | Discharge status: Home / Self Care | Payer: Medicare Other | Source: Ambulatory Visit | Attending: Adult Health | Admitting: Adult Health

## 2023-07-14 VITALS — BP 128/74 | HR 76 | Wt 123.0 lb

## 2023-07-14 DIAGNOSIS — Z79899 Other long term (current) drug therapy: Secondary | ICD-10-CM | POA: Diagnosis not present

## 2023-07-14 DIAGNOSIS — R0602 Shortness of breath: Secondary | ICD-10-CM | POA: Diagnosis not present

## 2023-07-14 DIAGNOSIS — I5023 Acute on chronic systolic (congestive) heart failure: Secondary | ICD-10-CM | POA: Diagnosis not present

## 2023-07-14 DIAGNOSIS — I493 Ventricular premature depolarization: Secondary | ICD-10-CM

## 2023-07-14 DIAGNOSIS — Z7901 Long term (current) use of anticoagulants: Secondary | ICD-10-CM | POA: Insufficient documentation

## 2023-07-14 DIAGNOSIS — Z7982 Long term (current) use of aspirin: Secondary | ICD-10-CM | POA: Insufficient documentation

## 2023-07-14 DIAGNOSIS — Z7984 Long term (current) use of oral hypoglycemic drugs: Secondary | ICD-10-CM | POA: Insufficient documentation

## 2023-07-14 DIAGNOSIS — I110 Hypertensive heart disease with heart failure: Secondary | ICD-10-CM | POA: Diagnosis not present

## 2023-07-14 DIAGNOSIS — I255 Ischemic cardiomyopathy: Secondary | ICD-10-CM | POA: Diagnosis not present

## 2023-07-14 DIAGNOSIS — I340 Nonrheumatic mitral (valve) insufficiency: Secondary | ICD-10-CM | POA: Diagnosis not present

## 2023-07-14 DIAGNOSIS — R54 Age-related physical debility: Secondary | ICD-10-CM | POA: Diagnosis not present

## 2023-07-14 DIAGNOSIS — I2510 Atherosclerotic heart disease of native coronary artery without angina pectoris: Secondary | ICD-10-CM | POA: Insufficient documentation

## 2023-07-14 DIAGNOSIS — Z951 Presence of aortocoronary bypass graft: Secondary | ICD-10-CM | POA: Insufficient documentation

## 2023-07-14 DIAGNOSIS — I480 Paroxysmal atrial fibrillation: Secondary | ICD-10-CM | POA: Diagnosis not present

## 2023-07-14 LAB — COMPREHENSIVE METABOLIC PANEL
ALT: 44 U/L (ref 0–44)
AST: 23 U/L (ref 15–41)
Albumin: 3.5 g/dL (ref 3.5–5.0)
Alkaline Phosphatase: 67 U/L (ref 38–126)
Anion gap: 13 (ref 5–15)
BUN: 27 mg/dL — ABNORMAL HIGH (ref 8–23)
CO2: 26 mmol/L (ref 22–32)
Calcium: 9.1 mg/dL (ref 8.9–10.3)
Chloride: 95 mmol/L — ABNORMAL LOW (ref 98–111)
Creatinine, Ser: 0.84 mg/dL (ref 0.61–1.24)
GFR, Estimated: >60 mL/min (ref >60)
Glucose, Bld: 144 mg/dL — ABNORMAL HIGH (ref 70–99)
Potassium: 4.2 mmol/L (ref 3.5–5.1)
Sodium: 134 mmol/L — ABNORMAL LOW (ref 135–145)
Total Bilirubin: 0.5 mg/dL (ref 0.3–1.2)
Total Protein: 6.5 g/dL (ref 6.5–8.1)

## 2023-07-14 LAB — BRAIN NATRIURETIC PEPTIDE: B Natriuretic Peptide: 1631.8 pg/mL — ABNORMAL HIGH (ref 0.0–100.0)

## 2023-07-14 MED ORDER — EMPAGLIFLOZIN 25 MG PO TABS: 25.0000 mg | ORAL_TABLET | Freq: Every day | ORAL | 3 refills | Status: DC

## 2023-07-14 MED ORDER — FUROSEMIDE 40 MG PO TABS: 40.0000 mg | ORAL_TABLET | Freq: Every day | ORAL | 3 refills | Status: DC

## 2023-07-14 MED ORDER — METOPROLOL SUCCINATE ER 25 MG PO TB24: 12.5000 mg | ORAL_TABLET | Freq: Two times a day (BID) | ORAL | 3 refills | Status: DC

## 2023-07-14 NOTE — Progress Notes (Addendum)
PCP: [facility] Cardiology: Dr. Herbie Baltimore HF Cardiology: Dr. Shirlee Latch  77 y.o. with history of CAD s/p CABG, ischemic cardiomyopathy, PAD s/p aortobifemoral bypass, PAF, and severe mitral regurgitation was referred by Dr. Excell Seltzer for CHF evaluation prior to mTEER.  Patient had MI in 2/14 with cath showing severe 3VD.  CABG in 2/14 with LIMA-LAD, SVG-PDA, SVG-OM1. Subsequently developed ischemic cardiomyopathy.  He had St Jude CRT-D device placed.  Echo in 1/24 showed EF 40-45%, basal-mid AL and IL AK, basal inferior AK, mild LVH, moderate RV dysfunction with PASP 67 mmHG, severe LAE, severe infarct-related MR, mild-moderate TR.  He also has history of aortobifemoral bypass in 2014.  He had left SFA stent in 2022 for tissue loss in the left great toe. 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  Had breakthrough atrial fibrillation on sotalol with inappropriate ICD shock and is now off sotalol. He has been on mexiletine for frequent PVCs.   Patient's mitral regurgitation has been evaluated recently by Dr. Excell Seltzer for possible mTEER.  Cath in 7/24 showed elevated filling pressures and low cardiac output.  Bypass grafts were patent. TEE in 7/24 showed EF 30-35%, severe MR with restricted posterior leaflet and small flail segment in posterior leaflet, RV normal, mild AI.   Patient was started on spironolactone, developed mood changes (episodes of "rage") around that time so family had him stop it.  Mood is back to normal.   Admitted earlier this month acute on chronic CHF w/ suspect low-output 2/2 Afib with RVR. Lactic acid elevated up to 4.6 and elevated LFTs felt due to low-output. He underwent emergent DCCV to SR. Diuresed with IV lasix.   He is here today for follow-up. Patient is accompanied by his wife and daughter. His home weight has been stable since discharge. His family has noticed he developed recurrent orthopnea about 2 days after discharge. No  dyspnea at rest. Does get short of breath and fatigued with activity. Ambulates with a walker and started in-home PT this week. Wearing compression stockings. May have a little bit more leg swelling the last couple of days. He has been drinking ensure shakes and trying to increase caloric intake. Admits he has been eating a lot of salt and processed foods.  Family very involved. Daughter assists with medications.   Smokes a pipe, no ETOH, no drugs.   ECG (personally reviewed): V paced 77 bpm, 3 PVCs  Labs (6/24): hgb 13.9, digoxin level < 0.4, K 4.7, creatinine 0.75 Labs (7/24): K 4.5, creatinine 0.89 Labs (8/24): K 4.8, creatinine 0.97 Labs (10/24): K 3.7, creatinine 1.19, Na 130, BNP 2,925  St Jude device interrogation: Thoracic impedance trending below threshold a little over a week, 68% BiVpaced, 20% AT/AF since 09/13, no VT/VF   PMH: 1. CAD: s/p CABG.  MI 2/14, severe 3VD on cath.  CABG with LIMA-LAD, SVG-PDA, SVG-OM1.   - LHC (7/24): occluded RCA, patent SVG-RCA, 75% dLM/ostial LCx, occluded mLAD, patent SVG-OM, patent LIMA-LAD.  2. H/o VT 3. Chronic systolic CHF: Ischemic cardiomyopathy. St Jude CRT-D device.  - Echo (12/17): EF 20% - Echo (1/24): EF 40-45%, basal-mid AL and IL AK, basal inferior AK, mild LVH, moderate RV dysfunction with PASP 67 mmHG, severe LAE, severe infarct-related MR, mild-moderate TR.  - RHC (7/24): mean RA 10, PA 63/35 mean 46, mean PCWP 27 with v waves to 35, CI 1.5 F.  - TEE (7/24): EF 30-35%, severe MR with restricted posterior leaflet and small flail  segment in posterior leaflet, RV normal, mild AI.  4. PAD: S/p aortobifemoral bypass in 2014, later with left SFA stent in 2022. Followed by Dr. Chestine Spore.  5. Atrial fibrillation: Paroxysmal. Failed amiodarone due to lung toxicity.  Failed sotalol.  H/o inappropriate ICD shocks.  6. HTN 7. Gout  8. 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  Social History   Socioeconomic History   Marital status: Married     Spouse name: Not on file   Number of children: Not on file   Years of education: Not on file   Highest education level: Not on file  Occupational History   Not on file  Tobacco Use   Smoking status: Every Day    Types: Pipe    Passive exposure: Never   Smokeless tobacco: Former    Types: Snuff, Chew   Tobacco comments:    09/03/2016 "used smokeless tobacco in my teens; quit smoking cigarettes in the early 1980s; smokes pipe only"  Vaping Use   Vaping status: Never Used  Substance and Sexual Activity   Alcohol use: No   Drug use: No   Sexual activity: Not on file  Other Topics Concern   Not on file  Social History Narrative   Routine activity around the house, but is limited more by musculoskeletal leg pain and weakness.      He enjoys smoking a pipe-long ago, he quit cigarettes   Social Determinants of Health   Financial Resource Strain: Not on file  Food Insecurity: No Food Insecurity (06/30/2023)   Hunger Vital Sign    Worried About Running Out of Food in the Last Year: Never true    Ran Out of Food in the Last Year: Never true  Transportation Needs: No Transportation Needs (06/30/2023)   PRAPARE - Administrator, Civil Service (Medical): No    Lack of Transportation (Non-Medical): No  Physical Activity: Not on file  Stress: Not on file  Social Connections: Not on file  Intimate Partner Violence: Not At Risk (06/30/2023)   Humiliation, Afraid, Rape, and Kick questionnaire    Fear of Current or Ex-Partner: No    Emotionally Abused: No    Physically Abused: No    Sexually Abused: No   Family History  Problem Relation Age of Onset   Cancer Brother        esophageal   ROS: All systems reviewed and negative except as per HPI.   Current Outpatient Medications  Medication Sig Dispense Refill   acetaminophen (TYLENOL) 650 MG CR tablet Take 650 mg by mouth every 8 (eight) hours as needed for pain.     Alpha-Lipoic Acid 600 MG TABS Take 600 mg by mouth  daily.     apixaban (ELIQUIS) 5 MG TABS tablet Take 1 tablet (5 mg total) by mouth 2 (two) times daily. 60 tablet 5   aspirin EC 81 MG tablet Take 81 mg by mouth daily.     atorvastatin (LIPITOR) 40 MG tablet Take 1 tablet by mouth every evening.     cholecalciferol (VITAMIN D) 25 MCG (1000 UNIT) tablet Take 2,000 Units by mouth every evening.     Cholecalciferol 50 MCG (2000 UT) TABS Take 1 tablet by mouth daily.     dicloxacillin (DYNAPEN) 500 MG capsule Take 500 mg by mouth 2 (two) times daily.     digoxin (LANOXIN) 0.125 MG tablet Take 1 tablet (0.125 mg total) by mouth daily. Monday through Friday only, None on Saturday and Sunday 60 tablet  3   diphenhydrAMINE (BENADRYL) 25 mg capsule Take 25 mg by mouth at bedtime as needed for sleep.     fexofenadine (ALLEGRA) 180 MG tablet Take 180 mg by mouth daily.     fluticasone (FLONASE) 50 MCG/ACT nasal spray Place 2 sprays into both nostrils daily as needed for allergies or rhinitis.     glipiZIDE (GLUCOTROL) 5 MG tablet Take 5 mg by mouth 2 (two) times daily before a meal.     lidocaine (LIDODERM) 5 % Place 1 patch onto the skin daily as needed (For pain).     magnesium oxide (MAG-OX) 400 (241.3 Mg) MG tablet Take 1 tablet (400 mg total) by mouth daily. 30 tablet 6   metFORMIN (GLUCOPHAGE) 1000 MG tablet Take 1 tablet (1,000 mg total) by mouth 2 (two) times daily with a meal. Resume on 09/06/2016     mexiletine (MEXITIL) 150 MG capsule Take 1 capsule (150 mg total) by mouth every 12 (twelve) hours. 60 capsule 5   nitroGLYCERIN (NITROSTAT) 0.4 MG SL tablet Place 1 tablet (0.4 mg total) under the tongue every 5 (five) minutes x 3 doses as needed for chest pain. 25 tablet 3   Nutritional Supplements (ENSURE PLUS PO) Take 1 Can by mouth 2 (two) times daily.     OVER THE COUNTER MEDICATION Take 1 tablet by mouth See admin instructions. Relaxium every other night as needed     pantoprazole (PROTONIX) 40 MG tablet Take 1 tablet (40 mg total) by mouth  daily. 90 tablet 2   spironolactone (ALDACTONE) 25 MG tablet Take 0.5 tablets (12.5 mg total) by mouth daily.     Tiotropium Bromide Monohydrate (SPIRIVA RESPIMAT) 2.5 MCG/ACT AERS Inhale 2 puffs into the lungs at bedtime.     Vitamins A & D (VITAMIN A & D) ointment Apply 1 Application topically as needed for dry skin (on bottoms).     empagliflozin (JARDIANCE) 25 MG TABS tablet Take 1 tablet (25 mg total) by mouth daily. 90 tablet 3   furosemide (LASIX) 40 MG tablet Take 1 tablet (40 mg total) by mouth daily. 94 tablet 3   metoprolol succinate (TOPROL XL) 25 MG 24 hr tablet Take 0.5 tablets (12.5 mg total) by mouth in the morning and at bedtime. 45 tablet 3   No current facility-administered medications for this encounter.   Wt Readings from Last 3 Encounters:  07/14/23 55.8 kg (123 lb)  07/02/23 56.9 kg (125 lb 7.1 oz)  06/14/23 55.1 kg (121 lb 6.4 oz)   BP 128/74   Pulse 76   Wt 55.8 kg (123 lb) Comment: Patient's home morning weight  SpO2 100%   BMI 19.85 kg/m  General:  Chronically ill appearing elderly male HEENT: normal Neck: supple. JVP 8-10 Cor: PMI nondisplaced. Regular rate & rhythm. No rubs, gallops, 3/6 MR murmur Lungs: clear Abdomen: soft, nontender, nondistended.  Extremities: no cyanosis, clubbing, rash, 1+ edema, + TED hose Neuro: alert & orientedx3, cranial nerves grossly intact. moves all 4 extremities w/o difficulty. Affect pleasant   Assessment/Plan: 1. Acute on chronic systolic CHF: Ischemic cardiomyopathy.  St Jude CRT-D device.  Last study was a TEE in 7/24 showing EF 30-35%, severe MR with restricted posterior leaflet and small flail segment in posterior leaflet, RV normal, mild AI. 7/24 RHC showing elevated filling pressures and low cardiac output.  Admission in 10/24 with afib/RVR and CHF exacerbation, improved with DCCV and diuresis.   - NYHA class III.  Patient is quite frail. Limited to activity  around the house.  Limited by advanced heart failure/volume  overload worsened by ischemic mitral regurgitation as well as significant deconditioning. Volume overloaded on exam and by CorVue thoracic impedance. Will have him take 80 mg lasix 3 days then reduce dose back to 40 mg daily. [facility] to [facility]. - Cut back sodium intake - Continue digoxin.  - Increase Jardiance to 25 mg daily (at request of primary through [place] for DM). - Continue spironolactone 12.5 mg daily.  - Continue Toprol XL 12.5 mg daily - Labs today, may need potassium supplement - Will check to see if he can be followed closely in remote monitoring with Randon Goldsmith, RN. - Mitral regurgitation is severe, hopefully mTEER will help him symptomatically.  2. Mitral regurgitation: TEE (7/24) with severe MR with restricted posterior leaflet and small flail segment in posterior leaflet.  Suspect primarily infarct-related MR.  I suspect MR is playing a significant role in his symptomatology.  - Not surgical candidate. Though he is frail, he is considering proceeding with mTEER. He has follow up with Dr. Excell Seltzer to discuss.  3. Atrial fibrillation: Paroxysmal. Has failed amiodarone (lung toxicity) as well as sotalol (break through). Admitted with AF/RVR likely triggering CHF exacerbation.  - No anti-arrhythmic option, tolerates AF poorly.  Hopefully treatment of MR will help keep him in NSR more effectively.  - Continue apixaban 5 mg bid. No bleeding issues.  - Continue low-dose Toprol xl 4. PVCs: Frequent.  Continue mexiletine.  5. PAD: S/p aortobifemoral bypass 2014 then SFA stent 2022.  Followed by Dr. Chestine Spore.  Had left foot ulcerations that have healed.  6. CAD: s/p CABG in 2014. Cath in 7/24 showed patent grafts.  No chest pain.  - Continue aspirin - Continue atorvastatin.    Follow-up: Keep follow-up with Dr. Shirlee Latch as scheduled 09/06/23  [facility], Lillia Abed N PA-C 07/14/2023

## 2023-07-14 NOTE — Patient Instructions (Addendum)
EKG done today.   Labs done today. We will contact you only if your labs are abnormal.  INCREASE Jardiance 25mg  (1 tablet) by mouth daily.  INCREASE Lasix to 80mg  (2 tablets) by mouth daily for 3 days THEN DECREASE back down to 40mg  (1 tablet) by mouth daily.   No other medication changes were made. Please continue all current medications as prescribed.  Your physician recommends that you keep your scheduled follow-up appointment in December.   If you have any questions or concerns before your next appointment please send [place] a message through [place] or call our office at [phone].    TO LEAVE A MESSAGE FOR THE NURSE SELECT OPTION 2, PLEASE LEAVE A MESSAGE INCLUDING: YOUR NAME DATE OF BIRTH CALL BACK NUMBER REASON FOR CALL**this is important as we prioritize the call backs  YOU WILL RECEIVE A CALL BACK THE SAME DAY AS LONG AS YOU CALL BEFORE 4:00 PM   Do the following things EVERYDAY: Weigh yourself in the morning before breakfast. Write it down and keep it in a log. Take your medicines as prescribed Eat low salt foods--Limit salt (sodium) to 2000 mg per day.  Stay as active as you can everyday Limit all fluids for the day to less than 2 liters   At the [facility], you and your health needs are our priority. As part of our continuing mission to provide you with exceptional heart care, we have created designated Provider Care Teams. These Care Teams include your primary Cardiologist (physician) and Advanced Practice Providers (APPs- Physician Assistants and Nurse Practitioners) who all work together to provide you with the care you need, when you need it.   You may see any of the following providers on your designated Care Team at your next follow up: Dr Arvilla Meres Dr Marca Ancona Dr. Marcos Eke, NP Robbie Lis, [place] [facility] [place], [place] Brynda Peon, NP Karle Plumber, PharmD   Please be sure to bring in all your  medications bottles to every appointment.    Thank you for choosing [facility]

## 2023-07-15 ENCOUNTER — Ambulatory Visit (INDEPENDENT_AMBULATORY_CARE_PROVIDER_SITE_OTHER): Payer: Medicare Other

## 2023-07-15 DIAGNOSIS — I110 Hypertensive heart disease with heart failure: Secondary | ICD-10-CM | POA: Diagnosis not present

## 2023-07-15 DIAGNOSIS — E1151 Type 2 diabetes mellitus with diabetic peripheral angiopathy without gangrene: Secondary | ICD-10-CM | POA: Diagnosis not present

## 2023-07-15 DIAGNOSIS — E1142 Type 2 diabetes mellitus with diabetic polyneuropathy: Secondary | ICD-10-CM | POA: Diagnosis not present

## 2023-07-15 DIAGNOSIS — I255 Ischemic cardiomyopathy: Secondary | ICD-10-CM

## 2023-07-15 DIAGNOSIS — J449 Chronic obstructive pulmonary disease, unspecified: Secondary | ICD-10-CM | POA: Diagnosis not present

## 2023-07-15 DIAGNOSIS — I5023 Acute on chronic systolic (congestive) heart failure: Secondary | ICD-10-CM | POA: Diagnosis not present

## 2023-07-15 DIAGNOSIS — I2510 Atherosclerotic heart disease of native coronary artery without angina pectoris: Secondary | ICD-10-CM | POA: Diagnosis not present

## 2023-07-18 DIAGNOSIS — E1142 Type 2 diabetes mellitus with diabetic polyneuropathy: Secondary | ICD-10-CM | POA: Diagnosis not present

## 2023-07-18 DIAGNOSIS — E1151 Type 2 diabetes mellitus with diabetic peripheral angiopathy without gangrene: Secondary | ICD-10-CM | POA: Diagnosis not present

## 2023-07-18 DIAGNOSIS — I5023 Acute on chronic systolic (congestive) heart failure: Secondary | ICD-10-CM | POA: Diagnosis not present

## 2023-07-18 DIAGNOSIS — I2510 Atherosclerotic heart disease of native coronary artery without angina pectoris: Secondary | ICD-10-CM | POA: Diagnosis not present

## 2023-07-18 DIAGNOSIS — I110 Hypertensive heart disease with heart failure: Secondary | ICD-10-CM | POA: Diagnosis not present

## 2023-07-18 DIAGNOSIS — J449 Chronic obstructive pulmonary disease, unspecified: Secondary | ICD-10-CM | POA: Diagnosis not present

## 2023-07-18 LAB — CUP PACEART REMOTE DEVICE CHECK
Battery Remaining Longevity: 17 mo
Battery Remaining Percentage: 20 %
Battery Voltage: 2.77 V
Brady Statistic AP VP Percent: 1 %
Brady Statistic AP VS Percent: 1 %
Brady Statistic AS VP Percent: 83 %
Brady Statistic AS VS Percent: 11 %
Brady Statistic RA Percent Paced: 1 %
Date Time Interrogation Session: 20241025020026
HighPow Impedance: 52 Ohm
HighPow Impedance: 52 Ohm
Implantable Lead Connection Status: 753985
Implantable Lead Connection Status: 753985
Implantable Lead Connection Status: 753985
Implantable Lead Implant Date: 20171215
Implantable Lead Implant Date: 20171215
Implantable Lead Implant Date: 20171215
Implantable Lead Location: 753858
Implantable Lead Location: 753859
Implantable Lead Location: 753860
Implantable Lead Model: 7122
Implantable Pulse Generator Implant Date: 20171215
Lead Channel Impedance Value: 310 Ohm
Lead Channel Impedance Value: 390 Ohm
Lead Channel Impedance Value: 430 Ohm
Lead Channel Pacing Threshold Amplitude: 0.75 V
Lead Channel Pacing Threshold Amplitude: 0.75 V
Lead Channel Pacing Threshold Amplitude: 1 V
Lead Channel Pacing Threshold Pulse Width: 0.5 ms
Lead Channel Pacing Threshold Pulse Width: 0.5 ms
Lead Channel Pacing Threshold Pulse Width: 0.5 ms
Lead Channel Sensing Intrinsic Amplitude: 1.6 mV
Lead Channel Sensing Intrinsic Amplitude: 11.7 mV
Lead Channel Setting Pacing Amplitude: 2 V
Lead Channel Setting Pacing Amplitude: 2 V
Lead Channel Setting Pacing Amplitude: 2 V
Lead Channel Setting Pacing Pulse Width: 0.5 ms
Lead Channel Setting Pacing Pulse Width: 0.5 ms
Lead Channel Setting Sensing Sensitivity: 0.5 mV
Pulse Gen Serial Number: 7368976

## 2023-07-20 DIAGNOSIS — I5023 Acute on chronic systolic (congestive) heart failure: Secondary | ICD-10-CM | POA: Diagnosis not present

## 2023-07-20 DIAGNOSIS — I2510 Atherosclerotic heart disease of native coronary artery without angina pectoris: Secondary | ICD-10-CM | POA: Diagnosis not present

## 2023-07-20 DIAGNOSIS — I110 Hypertensive heart disease with heart failure: Secondary | ICD-10-CM | POA: Diagnosis not present

## 2023-07-20 DIAGNOSIS — J449 Chronic obstructive pulmonary disease, unspecified: Secondary | ICD-10-CM | POA: Diagnosis not present

## 2023-07-20 DIAGNOSIS — E1151 Type 2 diabetes mellitus with diabetic peripheral angiopathy without gangrene: Secondary | ICD-10-CM | POA: Diagnosis not present

## 2023-07-20 DIAGNOSIS — E1142 Type 2 diabetes mellitus with diabetic polyneuropathy: Secondary | ICD-10-CM | POA: Diagnosis not present

## 2023-07-21 DIAGNOSIS — I110 Hypertensive heart disease with heart failure: Secondary | ICD-10-CM | POA: Diagnosis not present

## 2023-07-21 DIAGNOSIS — I2510 Atherosclerotic heart disease of native coronary artery without angina pectoris: Secondary | ICD-10-CM | POA: Diagnosis not present

## 2023-07-21 DIAGNOSIS — E1142 Type 2 diabetes mellitus with diabetic polyneuropathy: Secondary | ICD-10-CM | POA: Diagnosis not present

## 2023-07-21 DIAGNOSIS — I5023 Acute on chronic systolic (congestive) heart failure: Secondary | ICD-10-CM | POA: Diagnosis not present

## 2023-07-21 DIAGNOSIS — E1151 Type 2 diabetes mellitus with diabetic peripheral angiopathy without gangrene: Secondary | ICD-10-CM | POA: Diagnosis not present

## 2023-07-21 DIAGNOSIS — J449 Chronic obstructive pulmonary disease, unspecified: Secondary | ICD-10-CM | POA: Diagnosis not present

## 2023-07-22 DIAGNOSIS — I2510 Atherosclerotic heart disease of native coronary artery without angina pectoris: Secondary | ICD-10-CM | POA: Diagnosis not present

## 2023-07-22 DIAGNOSIS — I110 Hypertensive heart disease with heart failure: Secondary | ICD-10-CM | POA: Diagnosis not present

## 2023-07-22 DIAGNOSIS — E1142 Type 2 diabetes mellitus with diabetic polyneuropathy: Secondary | ICD-10-CM | POA: Diagnosis not present

## 2023-07-22 DIAGNOSIS — E1151 Type 2 diabetes mellitus with diabetic peripheral angiopathy without gangrene: Secondary | ICD-10-CM | POA: Diagnosis not present

## 2023-07-22 DIAGNOSIS — I5023 Acute on chronic systolic (congestive) heart failure: Secondary | ICD-10-CM | POA: Diagnosis not present

## 2023-07-22 DIAGNOSIS — J449 Chronic obstructive pulmonary disease, unspecified: Secondary | ICD-10-CM | POA: Diagnosis not present

## 2023-07-25 DIAGNOSIS — E1151 Type 2 diabetes mellitus with diabetic peripheral angiopathy without gangrene: Secondary | ICD-10-CM | POA: Diagnosis not present

## 2023-07-25 DIAGNOSIS — J449 Chronic obstructive pulmonary disease, unspecified: Secondary | ICD-10-CM | POA: Diagnosis not present

## 2023-07-25 DIAGNOSIS — I110 Hypertensive heart disease with heart failure: Secondary | ICD-10-CM | POA: Diagnosis not present

## 2023-07-25 DIAGNOSIS — I2510 Atherosclerotic heart disease of native coronary artery without angina pectoris: Secondary | ICD-10-CM | POA: Diagnosis not present

## 2023-07-25 DIAGNOSIS — E1142 Type 2 diabetes mellitus with diabetic polyneuropathy: Secondary | ICD-10-CM | POA: Diagnosis not present

## 2023-07-25 DIAGNOSIS — I5023 Acute on chronic systolic (congestive) heart failure: Secondary | ICD-10-CM | POA: Diagnosis not present

## 2023-07-27 ENCOUNTER — Ambulatory Visit (INDEPENDENT_AMBULATORY_CARE_PROVIDER_SITE_OTHER): Payer: No Typology Code available for payment source

## 2023-07-27 ENCOUNTER — Encounter (HOSPITAL_COMMUNITY): Payer: Self-pay | Admitting: Cardiology

## 2023-07-27 DIAGNOSIS — I5022 Chronic systolic (congestive) heart failure: Secondary | ICD-10-CM

## 2023-07-27 DIAGNOSIS — E1151 Type 2 diabetes mellitus with diabetic peripheral angiopathy without gangrene: Secondary | ICD-10-CM | POA: Diagnosis not present

## 2023-07-27 DIAGNOSIS — I110 Hypertensive heart disease with heart failure: Secondary | ICD-10-CM | POA: Diagnosis not present

## 2023-07-27 DIAGNOSIS — Z95810 Presence of automatic (implantable) cardiac defibrillator: Secondary | ICD-10-CM

## 2023-07-27 DIAGNOSIS — I2510 Atherosclerotic heart disease of native coronary artery without angina pectoris: Secondary | ICD-10-CM | POA: Diagnosis not present

## 2023-07-27 DIAGNOSIS — I5023 Acute on chronic systolic (congestive) heart failure: Secondary | ICD-10-CM | POA: Diagnosis not present

## 2023-07-27 DIAGNOSIS — E1142 Type 2 diabetes mellitus with diabetic polyneuropathy: Secondary | ICD-10-CM | POA: Diagnosis not present

## 2023-07-27 DIAGNOSIS — J449 Chronic obstructive pulmonary disease, unspecified: Secondary | ICD-10-CM | POA: Diagnosis not present

## 2023-07-27 NOTE — Progress Notes (Signed)
EPIC Encounter for ICM Monitoring  Patient Name: [name] [name] is a 77 y.o. male Date: 07/27/2023 Primary Care Physican: [facility] Primary Cardiologist: [place] Electrophysiologist: Rae Roam Pacing:  73% (was 89% on 9/13 report) 08/31/2022 Weight: 127 lbs 10/13/2022 Weight: 130 lbs 01/28/2023 Weight: 125 lbs 04/11/2023 Weight: 116 lbs 07/27/2023 Weight: 118 lbs   AT/AF Burden 15%   Spoke with daughter per [facility] and heart failure questions reviewed.  Transmission results reviewed.  She reports weight has returned to baseline.    Anna Genre, PA at [facility] clinic requested a fluid level recheck following 07/14/2023 OV.  Diet:  Per daughter 11/6, patient does not follow a low salt diet.  She and the rest of the family have encouraged him to follow low salt diet but he declines.     Corvue thoracic impedance suggesting fluid levels returned to normal after taking Lasix 80 mg x 3 days as instructed at 10/24 OV.     Prescribed:  Furosemide 40 mg Take 1 tablet(s) (40 mg total) by mouth daily.   Spironolactone 25 mg take 0.5 tablet (12.5 mg total) by mouth daily   Labs: 06/13/2023 Creatinine 0.87, BUN 15, Potassium 4.4, Sodium 132, GFR >60 05/12/2023 Creatinine 0.97, BUN 13, Potassium 4.8, Sodium 134, GFR >60 05/02/2023 Creatinine 0.89, BUN 14, Potassium 4.5, Sodium 132, GFR >60 04/11/2023 Creatinine 0.89, BUN 17, Potassium 4.5, Sodium 129, GFR >60 03/08/2023 Creatinine 0.75, BUN 9,   Potassium 4.7, Sodium 130, GFR 94  03/03/2023 Creatinine 0.72, BUN 10, Potassium 5.6, Sodium 128, GFR 95  02/18/2023 Creatinine 0.92, BUN 16, Potassium 3.6, Sodium 129  11/20/2021 Creatinine 0.86, BUN 8,   Potassium 5.2, Sodium 131, GFR 91 11/11/2021 Creatinine 0.88, BUN 10, Potassium 5.5, Sodium 128, GFR 91 10/07/2021 Creatinine 0.86, BUN 8,   Potassium 5.3, Sodium 131, GFR 91 A complete set of results can be found in Results Review.   Recommendations:  No changes and encouraged to call  if experiencing any fluid symptoms.   Follow-up plan: ICM clinic phone appointment on 08/08/2023   91 day device clinic remote transmission 10/14/2023.     EP/Cardiology Office Visits:  07/29/2023 with Dr Excell Seltzer.  08/17/2023 with Dr Herbie Baltimore.  09/06/2023 with Dr Shirlee Latch.     Copy of ICM check sent to Dr. Ladona Ridgel and Anna Genre, PA as requested to recheck fluid levels.  AT/AF   3 month ICM trend: 07/27/2023.    12-14 Month ICM trend:     Karie Soda, RN 07/27/2023 10:01 AM

## 2023-07-28 DIAGNOSIS — I5023 Acute on chronic systolic (congestive) heart failure: Secondary | ICD-10-CM | POA: Diagnosis not present

## 2023-07-28 DIAGNOSIS — I110 Hypertensive heart disease with heart failure: Secondary | ICD-10-CM | POA: Diagnosis not present

## 2023-07-28 DIAGNOSIS — I2510 Atherosclerotic heart disease of native coronary artery without angina pectoris: Secondary | ICD-10-CM | POA: Diagnosis not present

## 2023-07-28 DIAGNOSIS — J449 Chronic obstructive pulmonary disease, unspecified: Secondary | ICD-10-CM | POA: Diagnosis not present

## 2023-07-28 DIAGNOSIS — E1151 Type 2 diabetes mellitus with diabetic peripheral angiopathy without gangrene: Secondary | ICD-10-CM | POA: Diagnosis not present

## 2023-07-28 DIAGNOSIS — E1142 Type 2 diabetes mellitus with diabetic polyneuropathy: Secondary | ICD-10-CM | POA: Diagnosis not present

## 2023-07-29 ENCOUNTER — Ambulatory Visit (HOSPITAL_BASED_OUTPATIENT_CLINIC_OR_DEPARTMENT_OTHER): Payer: No Typology Code available for payment source

## 2023-07-29 ENCOUNTER — Ambulatory Visit
Payer: No Typology Code available for payment source | Attending: Cardiovascular Disease | Admitting: Cardiovascular Disease

## 2023-07-29 ENCOUNTER — Encounter: Payer: Self-pay | Admitting: Cardiovascular Disease

## 2023-07-29 VITALS — BP 118/62 | HR 46 | Ht 66.0 in | Wt 122.2 lb

## 2023-07-29 DIAGNOSIS — I340 Nonrheumatic mitral (valve) insufficiency: Secondary | ICD-10-CM | POA: Diagnosis present

## 2023-07-29 LAB — ECHOCARDIOGRAM COMPLETE
MV M vel: 4.72 m/s
MV Peak grad: 89.1 mm[Hg]
P 1/2 time: 488 ms
Radius: 0.5 cm
S' Lateral: 5.2 cm

## 2023-07-29 NOTE — Progress Notes (Signed)
Cardiology Office Note:    Date:  07/29/2023   ID:  [name] [name], DOB [birth year], MRN [id number]  PCP:  Clinic, [facility] Providers Cardiologist:  Bryan Lemma, MD Electrophysiologist:  Lewayne Bunting, MD     Referring MD: Dellia Cloud, MD   Chief Complaint  Patient presents with   Shortness of Breath    History of Present Illness:    [name] [name] is a 77 y.o. male presenting for follow-up of mitral regurgitation.  The patient has a complex medical history with CAD status post CABG, chronic ischemic cardiomyopathy, PAD status post aortobifemoral bypass, and paroxysmal atrial fibrillation.  He has undergone CRT-D.  He has severe ischemic/infarct related mitral regurgitation.  He was hospitalized in October 2024 with low output heart failure in the setting of atrial fibrillation with RVR.  His lactic acid was significantly elevated up to 4.6 and he ultimately required emergency cardioversion.  He was diuresed with IV Lasix and he continues to follow closely in the heart failure clinic.  He presents today for follow-up of his mitral valve disease and to continue discussion regarding transcatheter edge-to-edge repair of the mitral valve.  The patient is here with his wife and daughter today.  He is doing fairly well at present.  He continues to have shortness of breath with physical activity.  No orthopnea or PND.  No chest pain, abdominal pain, or leg swelling at present.  No bleeding problems on apixaban.  Current Medications: Current Meds  Medication Sig   acetaminophen (TYLENOL) 650 MG CR tablet Take 650 mg by mouth every 8 (eight) hours as needed for pain.   Alpha-Lipoic Acid 600 MG TABS Take 600 mg by mouth daily.   apixaban (ELIQUIS) 5 MG TABS tablet Take 1 tablet (5 mg total) by mouth 2 (two) times daily.   aspirin EC 81 MG tablet Take 81 mg by mouth daily.   atorvastatin (LIPITOR) 40 MG tablet Take 1 tablet by mouth every evening.    cholecalciferol (VITAMIN D) 25 MCG (1000 UNIT) tablet Take 2,000 Units by mouth every evening.   dicloxacillin (DYNAPEN) 500 MG capsule Take 500 mg by mouth 2 (two) times daily.   digoxin (LANOXIN) 0.125 MG tablet Take 1 tablet (0.125 mg total) by mouth daily. Monday through Friday only, None on Saturday and Sunday   diphenhydrAMINE (BENADRYL) 25 mg capsule Take 25 mg by mouth at bedtime as needed for sleep.   empagliflozin (JARDIANCE) 25 MG TABS tablet Take 1 tablet (25 mg total) by mouth daily.   fexofenadine (ALLEGRA) 180 MG tablet Take 180 mg by mouth daily.   fluticasone (FLONASE) 50 MCG/ACT nasal spray Place 2 sprays into both nostrils daily as needed for allergies or rhinitis.   furosemide (LASIX) 40 MG tablet Take 1 tablet (40 mg total) by mouth daily.   glipiZIDE (GLUCOTROL) 5 MG tablet Take 5 mg by mouth 2 (two) times daily before a meal.   lidocaine (LIDODERM) 5 % Place 1 patch onto the skin daily as needed (For pain).   magnesium oxide (MAG-OX) 400 (241.3 Mg) MG tablet Take 1 tablet (400 mg total) by mouth daily.   metFORMIN (GLUCOPHAGE) 1000 MG tablet Take 1 tablet (1,000 mg total) by mouth 2 (two) times daily with a meal. Resume on 09/06/2016   metoprolol succinate (TOPROL XL) 25 MG 24 hr tablet Take 0.5 tablets (12.5 mg total) by mouth in the morning and at bedtime.   mexiletine (MEXITIL) 150 MG  capsule Take 1 capsule (150 mg total) by mouth every 12 (twelve) hours.   miconazole (MICOTIN) 2 % cream Apply 1 Application topically 2 (two) times daily.   nitroGLYCERIN (NITROSTAT) 0.4 MG SL tablet Place 1 tablet (0.4 mg total) under the tongue every 5 (five) minutes x 3 doses as needed for chest pain.   Nutritional Supplements (ENSURE PLUS PO) Take 1 Can by mouth 2 (two) times daily.   OVER THE COUNTER MEDICATION Take 1 tablet by mouth See admin instructions. Relaxium every other night as needed   pantoprazole (PROTONIX) 40 MG tablet Take 1 tablet (40 mg total) by mouth daily.    spironolactone (ALDACTONE) 25 MG tablet Take 0.5 tablets (12.5 mg total) by mouth daily.   Tiotropium Bromide Monohydrate (SPIRIVA RESPIMAT) 2.5 MCG/ACT AERS Inhale 2 puffs into the lungs at bedtime.   Vitamins A & D (VITAMIN A & D) ointment Apply 1 Application topically as needed for dry skin (on bottoms).     Allergies:   Lisinopril   ROS:   Please see the history of present illness.    All other systems reviewed and are negative.  EKGs/Labs/Other Studies Reviewed:    The following studies were reviewed today: Cardiac Studies & Procedures   CARDIAC CATHETERIZATION  CARDIAC CATHETERIZATION 03/25/2023  Narrative   Prox RCA to Dist RCA lesion is 100% stenosed with 100% stenosed side branch in RPAV.   Mid LM to Ost LAD lesion is 75% stenosed with 75% stenosed side branch in Ost Cx to Prox Cx.   Mid LAD to Dist LAD lesion is 100% stenosed.   SVG-RPDA graft was visualized by angiography.  Origin lesion is 50% stenosed (stable).  The remainder of the graft exhibits no disease.   SVG-OM1 graft was visualized by angiography and is large.  The graft exhibits no disease.   LIMA-LAD graft was visualized by angiography and is normal in caliber.  The graft exhibits no disease.  There is competitive flow.   Hemodynamic findings consistent with severe pulmonary hypertension. ->  Mean PAP 27 mmHg with a V wave of 35 mmHg.  Consistent with the severe MR noted on Echo/TEE   Severely reduced Cardiac Output and Index: 2.45-1.5.  FINDINGS Stable, multivessel disease with no major change. Native coronary and graft anatomy from previous catheterization: Known occlusion of the native RCA, patent SVG-PDA with ostial and proximal ~50% stenosis proximal to a valve.\ Distal LEFT MAIN 70% stenosis involving ostial LCx: Competitive flow in the native LCx with widely patent SVG-proxLCx with 2 major Mrg branches. ~50% proximal LAD at major bifurcating 1st Diag branch, this is followed by relatively normal vessel  with 3 septal perforators and a small all 2nd Diag before since subtotal lesion with mild competitive flow from patent LIMA-LAD.  RIGHT & LEFT HEART CATH: RAP mean 10 mmHg with V wave of 15 mmHg; RV P-EDP 60/2-12 mmHg PAP-mean 63/35-46 mmHg; PCWP mean 27 mm with a V wave of 35 mmHg LV P-EDP 127/9-19 mmHg, AO P-MAP 122/71-91 mmHg Ao sat 94%, PA sat 44% => CO-CI (Fick) 2.45-1.5 (significantly reduced)   RECOMMENDATIONS: Given IV Lasix 40 mg in the Cath Lab since he has not taken his home meds and been giving IV fluids.  Pressures are somewhat elevated. Continue home medication regimen and rearrange follow-up with Dr. Excell Seltzer in the structural heart clinic for TEER evaluation.  Findings Coronary Findings Diagnostic  Dominance: Right  Left Main Mid LM to Ost LAD lesion is 75% stenosed with 75% stenosed  side branch in Ost Cx to Prox Cx.  Left Anterior Descending Vessel is large. Mid LAD to Dist LAD lesion is 100% stenosed.  First Diagonal Branch Vessel is moderate in size.  First Septal Branch Vessel is small in size.  Second Diagonal Branch Vessel is small in size.  Second Septal Branch Vessel is small in size.  Third Septal Branch Vessel is small in size.  Left Circumflex Vessel is large.  First Obtuse Marginal Branch Vessel is large in size.  Second Obtuse Marginal Branch Vessel is large in size.  Left Posterior Atrioventricular Artery Vessel is small in size.  Right Coronary Artery Vessel was not injected. Known occlusion Prox RCA to Dist RCA lesion is 100% stenosed with 100% stenosed side branch in RPAV.  Right Posterior Descending Artery Vessel is small in size.  Saphenous Graft To RPDA SVG graft was visualized by angiography.  SVG-RPDA Origin lesion is 50% stenosed.  Saphenous Graft To 1st Mrg SVG graft was visualized by angiography and is large.  The graft exhibits no disease. SVG-OM1  LIMA LIMA Graft To Dist LAD LIMA graft was visualized by  angiography and is normal in caliber.  The graft exhibits no disease. There is competitive flow. Faint  Intervention  No interventions have been documented.   CARDIAC CATHETERIZATION  CARDIAC CATHETERIZATION 09/02/2016  Narrative  There is severe left ventricular systolic dysfunction.  LV end diastolic pressure is severely elevated.  The left ventricular ejection fraction is less than 25% by visual estimate.  There is no mitral valve regurgitation.  Prox RCA lesion, 100 %stenosed.  SVG graft was visualized by angiography.  Origin to Prox Graft lesion, 50 %stenosed.  Ost Cx to Prox Cx lesion, 60 %stenosed.  Ost LM to LM lesion, 60 %stenosed.  SVG graft was visualized by angiography.  LIMA graft was visualized by angiography.  Mid LAD lesion, 99 %stenosed.  1. Severe triple vessel CAD s/p 3V CABG 2. Moderate distal left main stenosis 3. Subtotal occlusion of the mid LAD. Patent LIMA graft to mid LAD 4. Patent Circumflex system. Patent vein graft to the high obtuse marginal. This fills the entire lateral wall Circumflex system and some of the LAD. Of note, there is also antegrade flow into the Circumflex from injection of the left main. 5. The RCA is occluded proximally. The vein graft to the distal RCA is patent. The vein graft is small in caliber with no flow limiting stenoses. 6. Severe LV systolic dysfunction, LVEF=10-15%. 7. Wide complex tachycardia. This began after access in the left radial artery. As discussed in the narrative, we attempted DCCV x 2, bolus of amiodarone x 2, adenosine. This eventually converted to sinus with IV Lidocaine.  Recommendations: Medical management of CAD. EP to see to discuss wide complex tachycardia.  Findings Coronary Findings Diagnostic  Dominance: Right  Left Main  Left Anterior Descending  First Diagonal Branch Vessel is large in size.  Second Diagonal Branch Vessel is small in size.  Third Diagonal Branch Vessel is  small in size.  Left Circumflex  First Obtuse Marginal Branch Vessel is large in size.  Second Obtuse Marginal Branch Vessel is moderate in size.  Third Obtuse Marginal Branch Vessel is moderate in size.  Right Coronary Artery The lesion is chronically occluded.  Saphenous Graft To RPDA SVG graft was visualized by angiography. The entire graft is small. There is a proximal stenosis followed by dilation of the vein graft (likely at a valve). The remainder of the graft is almost  the same size as the proximal body.  Saphenous Graft To 1st Mrg SVG graft was visualized by angiography.  LIMA LIMA Graft To Mid LAD LIMA graft was visualized by angiography.  Intervention  No interventions have been documented.   STRESS TESTS  MYOCARDIAL PERFUSION IMAGING 10/05/2022  Narrative   Findings are consistent with infarction. The study is high risk due to severely depressed ejection fraction.   No ST deviation was noted.   LV perfusion is abnormal. Defect 1: There is a large defect with severe reduction in uptake present in the mid to basal inferior and inferolateral location(s) that is fixed. There is abnormal wall motion in the defect area. Consistent with infarction.   Left ventricular function is abnormal. Global function is severely reduced. Nuclear stress EF: 28 %. The left ventricular ejection fraction is severely decreased (<30%). End diastolic cavity size is severely enlarged. End systolic cavity size is severely enlarged.   Prior study not available for comparison.   ECHOCARDIOGRAM  ECHOCARDIOGRAM COMPLETE 07/29/2023  Narrative ECHOCARDIOGRAM REPORT    Patient Name:   [name] [name] Date of Exam: 07/29/2023 Medical Rec #:  [id number]       Height:       66.0 in Accession #:    [id number]      Weight:       123.0 lb Date of Birth:  1946/09/12        BSA:          1.627 m Patient Age:    77 years        BP:           132/70 mmHg Patient Gender: M               HR:            97 bpm. Exam Location:  [address]  Procedure: 2D Echo, Cardiac Doppler and Color Doppler  Indications:    I34.0 Nonrheumatic mitral (valve) insufficiency  History:        Patient has prior history of Echocardiogram examinations, most recent 03/25/2023. CHF and Cardiomyopathy, Previous Myocardial Infarction and CAD, Defibrillator and Prior CABG, COPD and PAD, Signs/Symptoms:Fatigue; Risk Factors:Hypertension, Dyslipidemia, Diabetes and Current Smoker. Ventricular tachycardia. S/P AAA repair. Leg weakness. Ischemic heart disease.  Sonographer:    Cathie Beams RCS Referring Phys: [phone] Ona Rathert  IMPRESSIONS   1. Mid/inferior basal akinesis . Left ventricular ejection fraction, by estimation, is 35 to 40%. The left ventricle has moderately decreased function. The left ventricle demonstrates regional wall motion abnormalities (see scoring diagram/findings for description). The left ventricular internal cavity size was moderately dilated. Left ventricular diastolic parameters are indeterminate. 2. Device wires in RA/RV. Right ventricular systolic function is moderately reduced. The right ventricular size is mildly enlarged. 3. Left atrial size was severely dilated. 4. Right atrial size was mildly dilated. 5. Apically displaced coaptation. Restricted posterior leaflet motion in association with inferior RWMA suggests ischemic MR . The mitral valve is abnormal. Severe mitral valve regurgitation. 6. Tricuspid valve regurgitation is moderate. 7. Calcified right and left cusps with restricted motion . The aortic valve is calcified. There is moderate calcification of the aortic valve. There is moderate thickening of the aortic valve. Aortic valve regurgitation is mild. Aortic valve sclerosis is present, with no evidence of aortic valve stenosis.  FINDINGS Left Ventricle: Mid/inferior basal akinesis. Left ventricular ejection fraction, by estimation, is 35 to 40%. The left ventricle has  moderately decreased function. The left ventricle demonstrates regional  wall motion abnormalities. The left ventricular internal cavity size was moderately dilated. There is no left ventricular hypertrophy. Left ventricular diastolic parameters are indeterminate.  Right Ventricle: Device wires in RA/RV. The right ventricular size is mildly enlarged. Right vetricular wall thickness was not assessed. Right ventricular systolic function is moderately reduced.  Left Atrium: Left atrial size was severely dilated.  Right Atrium: Right atrial size was mildly dilated.  Pericardium: There is no evidence of pericardial effusion.  Mitral Valve: Apically displaced coaptation. Restricted posterior leaflet motion in association with inferior RWMA suggests ischemic MR. The mitral valve is abnormal. Severe mitral valve regurgitation.  Tricuspid Valve: The tricuspid valve is normal in structure. Tricuspid valve regurgitation is moderate.  Aortic Valve: Calcified right and left cusps with restricted motion. The aortic valve is calcified. There is moderate calcification of the aortic valve. There is moderate thickening of the aortic valve. Aortic valve regurgitation is mild. Aortic regurgitation PHT measures 488 msec. Aortic valve sclerosis is present, with no evidence of aortic valve stenosis.  Pulmonic Valve: The pulmonic valve was normal in structure. Pulmonic valve regurgitation is mild.  Aorta: The aortic root is normal in size and structure.  IAS/Shunts: No atrial level shunt detected by color flow Doppler.  Additional Comments: A device lead is visualized.   LEFT VENTRICLE PLAX 2D LVIDd:         5.50 cm LVIDs:         5.20 cm LV PW:         0.90 cm LV IVS:        1.00 cm LVOT diam:     2.00 cm LV SV:         79 LV SV Index:   49 LVOT Area:     3.14 cm   RIGHT VENTRICLE RV Basal diam:  5.30 cm RV S prime:     9.53 cm/s TAPSE (M-mode): 1.1 cm  LEFT ATRIUM              Index         RIGHT ATRIUM           Index LA diam:        5.60 cm  3.44 cm/m   RA Area:     12.50 cm LA Vol (A2C):   116.0 ml 71.32 ml/m  RA Volume:   27.50 ml  16.91 ml/m LA Vol (A4C):   67.1 ml  41.25 ml/m LA Biplane Vol: 87.0 ml  53.49 ml/m AORTIC VALVE LVOT Vmax:   171.67 cm/s LVOT Vmean:  103.667 cm/s LVOT VTI:    0.253 m AI PHT:      488 msec  AORTA Ao Root diam: 3.10 cm Ao Asc diam:  3.50 cm  MR Peak grad:    89.1 mmHg    TRICUSPID VALVE MR Mean grad:    54.7 mmHg    TR Peak grad:   44.4 mmHg MR Vmax:         472.00 cm/s  TR Vmax:        333.00 cm/s MR Vmean:        338.3 cm/s MR PISA:         1.57 cm     SHUNTS MR PISA Eff ROA: 19 mm       Systemic VTI:  0.25 m MR PISA Radius:  0.50 cm      Systemic Diam: 2.00 cm  Charlton Haws MD Electronically signed by Charlton Haws MD Signature Date/Time: 07/29/2023/2:07:03 PM  Final   TEE  ECHO TEE 03/25/2023  Narrative TRANSESOPHOGEAL ECHO REPORT    Patient Name:   [name] [name] Date of Exam: 03/25/2023 Medical Rec #:  [id number]       Height:       66.0 in Accession #:    [id number]      Weight:       126.0 lb Date of Birth:  Jan 03, 1946        BSA:          1.643 m Patient Age:    77 years        BP:           101/58 mmHg Patient Gender: M               HR:           86 bpm. Exam Location:  Inpatient  Procedure: Transesophageal Echo, 3D Echo, Color Doppler and Cardiac Doppler  Indications:     Mitral Regurgitation i34.0  History:         Patient has prior history of Echocardiogram examinations, most recent 10/05/2022. Prior CABG and Defibrillator, COPD, Arrythmias:Atrial Fibrillation; Risk Factors:Hypertension, Diabetes and Dyslipidemia.  Sonographer:     Irving Burton Senior RDCS Referring Phys:  [id number] [facility] A Izora Ribas Diagnosing Phys: Riley Lam MD  PROCEDURE: After discussion of the risks and benefits of a TEE, an informed consent was obtained from the patient. The transesophogeal probe was passed without  difficulty through the esophogus of the patient. Sedation performed by different physician. The patient was monitored while under deep sedation. Anesthestetic sedation was provided intravenously by Anesthesiology: 172mg  of Propofol, 60mg  of Lidocaine. The patient developed no complications during the procedure.  IMPRESSIONS   1. There is severe regurgitation related predominatly to posterior restriction of P1 and P2, there is a small flail segement at the P3 commisure. Jet is just lateral to A2-P2. There is 3D VCA of 0.84 cm3 and systolic reveral of the left sided pulmonary vein flow with right sided blunting. Sufficient height for mTEER (5.01 cm) with sufficient posterior leaflet length (1.52 cm), 3D MVA 5.64 cm2. The mitral valve is abnormal. Severe mitral valve regurgitation. The mean mitral valve gradient is 3.0 mmHg with average heart rate of 86 bpm. 2. Left ventricular ejection fraction, by estimation, is 30 to 35%. Left ventricular ejection fraction by 3D volume is 34 %. The left ventricle has moderately decreased function. 3. Right ventricular systolic function is normal. The right ventricular size is normal. 4. There is a small < 1 cm echodensity on the right atrial lead. Left atrial size was moderately dilated. No left atrial/left atrial appendage thrombus was detected. 5. Right atrial size was moderately dilated. 6. Nodular calcification on right and non cusps that does not apper to involve the commissure. The aortic valve is tricuspid. There is severe calcifcation of the aortic valve. Aortic valve regurgitation is mild. Aortic valve sclerosis is present, with no evidence of aortic valve stenosis. 7. The inferior vena cava is normal in size with greater than 50% respiratory variability, suggesting right atrial pressure of 3 mmHg.  FINDINGS Left Ventricle: Left ventricular ejection fraction, by estimation, is 30 to 35%. Left ventricular ejection fraction by 3D volume is 34 %. The left  ventricle has moderately decreased function. The left ventricular internal cavity size was normal in size.  Right Ventricle: The right ventricular size is normal. Right vetricular wall thickness was not well visualized. Right ventricular systolic function is normal.  Left  Atrium: There is a small < 1 cm echodensity on the right atrial lead. Left atrial size was moderately dilated. No left atrial/left atrial appendage thrombus was detected.  Right Atrium: Right atrial size was moderately dilated.  Pericardium: There is no evidence of pericardial effusion.  Mitral Valve: There is severe regurgitation related predominatly to posterior restriction of P1 and P2, there is a small flail segement at the P3 commisure. Jet is just lateral to A2-P2. There is 3D VCA of 0.84 cm3 and systolic reveral of the left sided pulmonary vein flow with right sided blunting. Sufficient height for mTEER (5.01 cm) with sufficient posterior leaflet length (1.52 cm), 3D MVA 5.64 cm2. The mitral valve is abnormal. Severe mitral valve regurgitation. MV peak gradient, 6.2 mmHg. The mean mitral valve gradient is 3.0 mmHg with average heart rate of 86 bpm.  Tricuspid Valve: Lead associated tricuspid regurgitation. The tricuspid valve is normal in structure. Tricuspid valve regurgitation is mild . No evidence of tricuspid stenosis.  Aortic Valve: Nodular calcification on right and non cusps that does not apper to involve the commissure. The aortic valve is tricuspid. There is severe calcifcation of the aortic valve. Aortic valve regurgitation is mild. Aortic valve sclerosis is present, with no evidence of aortic valve stenosis.  Pulmonic Valve: The pulmonic valve was grossly normal. Pulmonic valve regurgitation is trivial. No evidence of pulmonic stenosis.  Aorta: The aortic root, ascending aorta and aortic arch are all structurally normal, with no evidence of dilitation or obstruction.  Venous: The inferior vena cava is normal  in size with greater than 50% respiratory variability, suggesting right atrial pressure of 3 mmHg.  IAS/Shunts: No atrial level shunt detected by color flow Doppler.  Additional Comments: A device lead is visualized in the right atrium, right ventricle and superior vena cava.   3D Volume EF LV 3D EF:    Left ventricular ejection fraction by 3D volume is 34 %. LV 3D EDV:   289.47 ml LV 3D ESV:   190.55 ml  3D Volume EF: 3D EF:        34 %   AORTA Ao Asc diam: 3.40 cm  MITRAL VALVE                  TRICUSPID VALVE MV Peak grad: 6.2 mmHg        TR Peak grad:   29.8 mmHg MV Mean grad: 3.0 mmHg        TR Vmax:        273.00 cm/s MV Vmax:      1.24 m/s MV Vmean:     78.3 cm/s MR Peak grad:    83.2 mmHg MR Mean grad:    58.0 mmHg MR Vmax:         456.00 cm/s MR Vmean:        360.0 cm/s MR PISA:         3.08 cm MR PISA Eff ROA: 22 mm MR PISA Radius:  0.70 cm  Riley Lam MD Electronically signed by Riley Lam MD Signature Date/Time: 03/25/2023/2:53:10 PM    Final            EKG:        Recent Labs: 07/01/2023: Hemoglobin 12.4; Magnesium 2.2; Platelets 237 07/14/2023: ALT 44; B Natriuretic Peptide 1,631.8; BUN 27; Creatinine, Ser 0.84; Potassium 4.2; Sodium 134  Recent Lipid Panel    Component Value Date/Time   CHOL 115 10/07/2021 1408   TRIG 91 10/07/2021 1408   HDL  35 (L) 10/07/2021 1408   CHOLHDL 3.3 10/07/2021 1408   CHOLHDL 5.0 05/14/2021 0335   VLDL 22 05/14/2021 0335   LDLCALC 62 10/07/2021 1408             Physical Exam:    VS:  BP 118/62   Pulse (!) 46   Ht 5\' 6"  (1.676 m)   Wt 122 lb 3.2 oz (55.4 kg)   SpO2 99%   BMI 19.72 kg/m     Wt Readings from Last 3 Encounters:  07/29/23 122 lb 3.2 oz (55.4 kg)  07/14/23 123 lb (55.8 kg)  07/02/23 125 lb 7.1 oz (56.9 kg)     GEN:  elderly, frail appearing male in a wheelchair, in no acute distress HEENT: Normal NECK: No JVD; No carotid bruits LYMPHATICS: No  lymphadenopathy CARDIAC: RRR, 3/6 holosystolic murmur at the apex RESPIRATORY:  Clear to auscultation without rales, wheezing or rhonchi  ABDOMEN: Soft, non-tender, non-distended MUSCULOSKELETAL:  No edema; No deformity  SKIN: Warm and dry NEUROLOGIC:  Alert and oriented x 3 PSYCHIATRIC:  Normal affect   Assessment & Plan Nonrheumatic mitral (valve) insufficiency The patient has severe, nonrheumatic mitral regurgitation.  On my review of his echo and transesophageal echo studies, I think the functional anatomy of the patient's mitral regurgitation is Carpentier 3B disease with restriction of the posterior leaflet related to ischemic heart disease and akinesis of the inferoposterior wall.  He has been under optimal medical care and continues to struggle with New York Heart Association functional class III symptoms of chronic systolic heart failure.  He had a recent hospitalization for heart failure as well and he required IV diuretic therapy.  His mitral valve anatomy appears suitable for transcatheter edge-to-edge repair.  He has significant comorbid conditions outlined above with prior CABG, heart failure with CRT-D, vascular disease status post lower extremity bypass, and paroxysmal atrial fibrillation.  His medical therapy has been optimized by the advanced heart failure team under the care of Dr. Shirlee Latch.  Transcatheter edge-to-edge repair of the mitral valve is a reasonable treatment strategy due to improve his symptoms of heart failure and reduce his risk for rehospitalization.  We reviewed the natural history of mitral regurgitation and heart failure again today.  I reviewed the procedural steps of transcatheter edge-to-edge repair of the mitral valve with MitraClip with them.  He understands the risks of the procedure include but are not limited to bleeding, vascular injury, infection, pacemaker lead dislodgment, cardiac injury, stroke, myocardial infarction, need for emergency pericardiocentesis,  valve injury, single leaflet device attachment, device embolization, need for emergency surgery, and death.  The patient provides full informed consent for the procedure.  He should stop apixaban 48 hours before the procedure.  Otherwise he will continue his current medical program until he is scheduled for MitraClip.       Medication Adjustments/Labs and Tests Ordered: Current medicines are reviewed at length with the patient today.  Concerns regarding medicines are outlined above.  No orders of the defined types were placed in this encounter.  No orders of the defined types were placed in this encounter.   Patient Instructions  Medication Instructions:  HOLD Eliquis for 2 days prior to procedure   Testing/Procedures: MitraClip procedure 11/20-you will be contacted with further instructions  Follow-Up: At [facility], you and your health needs are our priority.  As part of our continuing mission to provide you with exceptional heart care, we have created designated Provider Care Teams.  These Care Teams  include your primary Cardiologist (physician) and Advanced Practice Providers (APPs -  Physician Assistants and Nurse Practitioners) who all work together to provide you with the care you need, when you need it.  Your next appointment:   Structural team to follow-up  Provider:   Tonny Bollman, MD     Signed, Tonny Bollman, MD  07/29/2023 4:36 PM    [facility]

## 2023-07-29 NOTE — Patient Instructions (Signed)
Medication Instructions:  HOLD Eliquis for 2 days prior to procedure   Testing/Procedures: MitraClip procedure 11/20-you will be contacted with further instructions  Follow-Up: At [facility], you and your health needs are our priority.  As part of our continuing mission to provide you with exceptional heart care, we have created designated Provider Care Teams.  These Care Teams include your primary Cardiologist (physician) and Advanced Practice Providers (APPs -  Physician Assistants and Nurse Practitioners) who all work together to provide you with the care you need, when you need it.  Your next appointment:   Structural team to follow-up  Provider:   Tonny Bollman, MD

## 2023-08-01 ENCOUNTER — Other Ambulatory Visit (HOSPITAL_COMMUNITY): Payer: Medicare Other

## 2023-08-01 ENCOUNTER — Ambulatory Visit: Payer: Medicare Other | Admitting: Cardiovascular Disease

## 2023-08-01 NOTE — Progress Notes (Signed)
Remote ICD transmission.   

## 2023-08-02 ENCOUNTER — Encounter: Payer: Self-pay | Admitting: Internal Medicine

## 2023-08-02 NOTE — Progress Notes (Signed)
PERIOPERATIVE PRESCRIPTION FOR IMPLANTED CARDIAC DEVICE PROGRAMMING  Patient Information: Name:  [name] [name]  DOB:  04-Jun-1946  MRN:  [id number]  Planned Procedure:  TRANSCATHETER MITRAL EDGE TO EDGE REPAIR  Surgeon:  Dr. Excell Seltzer Date of Procedure:  08/10/2023  Device Information:  Clinic EP Physician:  Lewayne Bunting, MD   Device Type:  Defibrillator Manufacturer and Phone #:  St. Jude/Abbott: [phone] Pacemaker Dependent?:  No. Date of Last Device Check:  07/15/2023 Normal Device Function?:  Yes.    Electrophysiologist's Recommendations:  Have magnet available. Provide continuous ECG monitoring when magnet is used or reprogramming is to be performed.  Procedure will likely interfere with device function.  Device should be programmed:  Tachy therapies disabled  Per Device Clinic Standing Orders, Wiliam Ke, RN  8:50 AM 08/02/2023

## 2023-08-03 ENCOUNTER — Telehealth: Payer: Self-pay

## 2023-08-03 DIAGNOSIS — I110 Hypertensive heart disease with heart failure: Secondary | ICD-10-CM | POA: Diagnosis not present

## 2023-08-03 DIAGNOSIS — I5023 Acute on chronic systolic (congestive) heart failure: Secondary | ICD-10-CM | POA: Diagnosis not present

## 2023-08-03 DIAGNOSIS — E1142 Type 2 diabetes mellitus with diabetic polyneuropathy: Secondary | ICD-10-CM | POA: Diagnosis not present

## 2023-08-03 DIAGNOSIS — E1151 Type 2 diabetes mellitus with diabetic peripheral angiopathy without gangrene: Secondary | ICD-10-CM | POA: Diagnosis not present

## 2023-08-03 DIAGNOSIS — I2510 Atherosclerotic heart disease of native coronary artery without angina pectoris: Secondary | ICD-10-CM | POA: Diagnosis not present

## 2023-08-03 DIAGNOSIS — J449 Chronic obstructive pulmonary disease, unspecified: Secondary | ICD-10-CM | POA: Diagnosis not present

## 2023-08-03 MED ORDER — POTASSIUM CHLORIDE CRYS ER 20 MEQ PO TBCR: 20.0000 meq | EXTENDED_RELEASE_TABLET | Freq: Every day | ORAL | 3 refills | Status: DC

## 2023-08-03 NOTE — Telephone Encounter (Signed)
Spoke with daughter.  Advised Prince Rome, NP from HF clinic would like to review a remote transmission today.  She said she will get a report sent within the next 30 minutes.

## 2023-08-03 NOTE — Addendum Note (Signed)
Addended by: Rob Bunting R on: 08/03/2023 03:25 PM   Modules accepted: Orders

## 2023-08-03 NOTE — Telephone Encounter (Signed)
  Received: Today Branch, Philicia R, CMA  Honey Zakarian, Josephine Igo, RN Hey can you get a reading on this patients device please. Prince Rome, NP wants to know if he is retaining fluid.

## 2023-08-03 NOTE — Telephone Encounter (Signed)
Spoke with daughter and af clinic.  Appt made tomorrow at 3pm with AF clinic.  ER precautions given.

## 2023-08-03 NOTE — Telephone Encounter (Signed)
Returned call to patient, per DPR, as requested by voice mail message.  She stated patients weight has increased from 122 lbs to 127 lbs.  He has not been feeling well over the last few days and heart has been racing.    Home health nurse contacted Dr Alford Highland office this morning and was told to increase Lasix to 80 mg daily until told other wise.  Pt refuses to go to ER.   She asked if device report shows any changes.  Requested to have patient send a report for review since the reports cannot be seen in real time.  She will let me know if she sends a report for review.

## 2023-08-03 NOTE — Telephone Encounter (Signed)
Patients daughter advised and verbalized understanding. Med sent into patients pharmacy.

## 2023-08-03 NOTE — Telephone Encounter (Addendum)
Spoke with daughter and advised of transmission fluid levels.   She reports patient was SOB last night and today as well as bloating of abdomen.    Copy sent to Prince Rome NP at [facility] clinic for review.    Message sent to device clinic triage to review regarding daughter's information about the heart racing.    08/03/2023 [facility] Thoracic impedance suggesting possible fluid accumulation since 11/6 and returned close to normal 11/13.   NSVT 8 episodes BiV Pacing: 71% AT/AF Burden 15%

## 2023-08-03 NOTE — Telephone Encounter (Signed)
  Caller: Unspecified (Today, 11:29 AM) Will keep Lasix at 80 mg daily, add 20 KCL daily. He will need a BMET in 1 week. Device looks like his BiV pacing % is low, ? If AF vs PVC's contributing. He does not tolerate AF well and this usually triggers a HF exacerbation.  Philicia, can you arrange an APP follow up or [facility] follow up soon please?

## 2023-08-03 NOTE — Telephone Encounter (Addendum)
Spoke with daughter.  HF clinic contacted her regarding Prince Rome, NP recommendations today.

## 2023-08-03 NOTE — Telephone Encounter (Signed)
Called and spoke to wife. Looks like pt reverted to AF 06/25/23 and has sustained since. Pt having symptoms of heart racing, and fatigue. Ventricular rate control less than ideal. CRTD BiV pacing % decreased. Pt is scheduled for mitraclip Wednesday. Reviewed w/ APP. Referring pt to AF clinic. LM on daughters phone to notify as daughter handles all medical affairs for pt.

## 2023-08-04 ENCOUNTER — Encounter (HOSPITAL_COMMUNITY): Payer: Self-pay | Admitting: Physician Assistant

## 2023-08-04 ENCOUNTER — Telehealth: Payer: Self-pay

## 2023-08-04 ENCOUNTER — Ambulatory Visit (HOSPITAL_COMMUNITY)
Admission: RE | Admit: 2023-08-04 | Discharge: 2023-08-04 | Disposition: A | Discharge status: Home / Self Care | Payer: Medicare Other | Source: Ambulatory Visit | Attending: Physician Assistant | Admitting: Physician Assistant

## 2023-08-04 VITALS — BP 114/90 | HR 114 | Ht 66.0 in | Wt 127.0 lb

## 2023-08-04 DIAGNOSIS — R531 Weakness: Secondary | ICD-10-CM | POA: Diagnosis not present

## 2023-08-04 DIAGNOSIS — D6869 Other thrombophilia: Secondary | ICD-10-CM | POA: Insufficient documentation

## 2023-08-04 DIAGNOSIS — R0602 Shortness of breath: Secondary | ICD-10-CM | POA: Insufficient documentation

## 2023-08-04 DIAGNOSIS — Z7901 Long term (current) use of anticoagulants: Secondary | ICD-10-CM | POA: Diagnosis not present

## 2023-08-04 DIAGNOSIS — J449 Chronic obstructive pulmonary disease, unspecified: Secondary | ICD-10-CM | POA: Diagnosis not present

## 2023-08-04 DIAGNOSIS — I340 Nonrheumatic mitral (valve) insufficiency: Secondary | ICD-10-CM | POA: Diagnosis not present

## 2023-08-04 DIAGNOSIS — I5023 Acute on chronic systolic (congestive) heart failure: Secondary | ICD-10-CM | POA: Diagnosis not present

## 2023-08-04 DIAGNOSIS — Z951 Presence of aortocoronary bypass graft: Secondary | ICD-10-CM | POA: Insufficient documentation

## 2023-08-04 DIAGNOSIS — I255 Ischemic cardiomyopathy: Secondary | ICD-10-CM | POA: Insufficient documentation

## 2023-08-04 DIAGNOSIS — I4892 Unspecified atrial flutter: Secondary | ICD-10-CM | POA: Diagnosis not present

## 2023-08-04 DIAGNOSIS — Z7982 Long term (current) use of aspirin: Secondary | ICD-10-CM | POA: Diagnosis not present

## 2023-08-04 DIAGNOSIS — I110 Hypertensive heart disease with heart failure: Secondary | ICD-10-CM | POA: Diagnosis not present

## 2023-08-04 DIAGNOSIS — F4310 Post-traumatic stress disorder, unspecified: Secondary | ICD-10-CM | POA: Diagnosis not present

## 2023-08-04 DIAGNOSIS — I2510 Atherosclerotic heart disease of native coronary artery without angina pectoris: Secondary | ICD-10-CM | POA: Insufficient documentation

## 2023-08-04 DIAGNOSIS — E1151 Type 2 diabetes mellitus with diabetic peripheral angiopathy without gangrene: Secondary | ICD-10-CM | POA: Insufficient documentation

## 2023-08-04 DIAGNOSIS — I5022 Chronic systolic (congestive) heart failure: Secondary | ICD-10-CM | POA: Diagnosis not present

## 2023-08-04 DIAGNOSIS — Z7984 Long term (current) use of oral hypoglycemic drugs: Secondary | ICD-10-CM | POA: Insufficient documentation

## 2023-08-04 DIAGNOSIS — I4819 Other persistent atrial fibrillation: Secondary | ICD-10-CM | POA: Diagnosis not present

## 2023-08-04 DIAGNOSIS — M109 Gout, unspecified: Secondary | ICD-10-CM | POA: Diagnosis not present

## 2023-08-04 DIAGNOSIS — E875 Hyperkalemia: Secondary | ICD-10-CM | POA: Diagnosis not present

## 2023-08-04 DIAGNOSIS — E1169 Type 2 diabetes mellitus with other specified complication: Secondary | ICD-10-CM | POA: Diagnosis not present

## 2023-08-04 DIAGNOSIS — Z79899 Other long term (current) drug therapy: Secondary | ICD-10-CM | POA: Diagnosis not present

## 2023-08-04 DIAGNOSIS — Z95810 Presence of automatic (implantable) cardiac defibrillator: Secondary | ICD-10-CM | POA: Diagnosis not present

## 2023-08-04 DIAGNOSIS — N179 Acute kidney failure, unspecified: Secondary | ICD-10-CM | POA: Diagnosis not present

## 2023-08-04 DIAGNOSIS — D649 Anemia, unspecified: Secondary | ICD-10-CM | POA: Diagnosis not present

## 2023-08-04 DIAGNOSIS — I480 Paroxysmal atrial fibrillation: Secondary | ICD-10-CM | POA: Diagnosis not present

## 2023-08-04 DIAGNOSIS — I252 Old myocardial infarction: Secondary | ICD-10-CM | POA: Diagnosis not present

## 2023-08-04 DIAGNOSIS — E7849 Other hyperlipidemia: Secondary | ICD-10-CM | POA: Diagnosis not present

## 2023-08-04 DIAGNOSIS — E1142 Type 2 diabetes mellitus with diabetic polyneuropathy: Secondary | ICD-10-CM | POA: Diagnosis not present

## 2023-08-04 DIAGNOSIS — I472 Ventricular tachycardia, unspecified: Secondary | ICD-10-CM | POA: Diagnosis not present

## 2023-08-04 DIAGNOSIS — K219 Gastro-esophageal reflux disease without esophagitis: Secondary | ICD-10-CM | POA: Diagnosis not present

## 2023-08-04 DIAGNOSIS — I493 Ventricular premature depolarization: Secondary | ICD-10-CM | POA: Diagnosis not present

## 2023-08-04 LAB — CBC
HCT: 36.1 % — ABNORMAL LOW (ref 39.0–52.0)
Hemoglobin: 11.3 g/dL — ABNORMAL LOW (ref 13.0–17.0)
MCH: 23.8 pg — ABNORMAL LOW (ref 26.0–34.0)
MCHC: 31.3 g/dL (ref 30.0–36.0)
MCV: 76.2 fL — ABNORMAL LOW (ref 80.0–100.0)
Platelets: 307 10*3/uL (ref 150–400)
RBC: 4.74 MIL/uL (ref 4.22–5.81)
RDW: 17.3 % — ABNORMAL HIGH (ref 11.5–15.5)
WBC: 7.7 10*3/uL (ref 4.0–10.5)
nRBC: 0.3 % — ABNORMAL HIGH (ref 0.0–0.2)

## 2023-08-04 LAB — BASIC METABOLIC PANEL
Anion gap: 13 (ref 5–15)
BUN: 34 mg/dL — ABNORMAL HIGH (ref 8–23)
CO2: 21 mmol/L — ABNORMAL LOW (ref 22–32)
Calcium: 8.7 mg/dL — ABNORMAL LOW (ref 8.9–10.3)
Chloride: 96 mmol/L — ABNORMAL LOW (ref 98–111)
Creatinine, Ser: 0.99 mg/dL (ref 0.61–1.24)
GFR, Estimated: >60 mL/min (ref >60)
Glucose, Bld: 146 mg/dL — ABNORMAL HIGH (ref 70–99)
Potassium: 5 mmol/L (ref 3.5–5.1)
Sodium: 130 mmol/L — ABNORMAL LOW (ref 135–145)

## 2023-08-04 NOTE — Patient Instructions (Signed)
Cardioversion scheduled for: Tuesday, November 19th   - Arrive at the [facility] and go to admitting at 830am   - Do not eat or drink anything after midnight the night prior to your procedure.   - Take all your morning medication (except diabetic medications) with a sip of water prior to arrival.  - You will not be able to drive home after your procedure.    - Do NOT miss any doses of your blood thinner - if you should miss a dose please notify our office immediately.   - If you feel as if you go back into normal rhythm prior to scheduled cardioversion, please notify our office immediately.   If your procedure is canceled in the cardioversion suite you will be charged a cancellation fee.     Hold below medications 72 hours prior to scheduled procedure/anesthesia. Restart medication on the following day after scheduled procedure/anesthesia  Empagliflozin (Jardiance) - hold after tomorrow until after the procedure is completed.          For those patients who have a scheduled procedure/anesthesia on the same day of the week as their dose, hold the medication on the day of surgery.  They can take their scheduled dose the week before.  **Patients on the above medications scheduled for elective procedures that have not held the medication for the appropriate amount of time are at risk of cancellation or change in the anesthetic plan.

## 2023-08-04 NOTE — Progress Notes (Signed)
Primary Care Physician: Clinic, [facility] Primary Cardiologist: Bryan Lemma, MD Electrophysiologist: Lewayne Bunting, MD  Referring Physician: Device clinic/[facility] AH[facility]: Dr Mardella Layman is a 77 y.o. male with a history of CAD status post CABG, chronic ischemic cardiomyopathy s/p CRT-D, DM, severe MR, PAD status post aortobifemoral bypass, atrial fibrillation who presents for follow up in the [facility]. 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 He was hospitalized in October 2024 with low output heart failure in the setting of atrial fibrillation with RVR. His lactic acid was significantly elevated up to 4.6 and he ultimately required emergency cardioversion. Patient is on Eliquis for a CHADS2VASC score of 6.  On follow up today, patient found to be back in afib on remote device interrogation 11/13. He feels poorly with generalized weakness and SOB. He is scheduled for Mitraclip with Dr Excell Seltzer on 07/31/23.   Today, he denies symptoms of palpitations, chest pain, orthopnea, PND, lower extremity edema, dizziness, presyncope, syncope, snoring, daytime somnolence, bleeding, or neurologic sequela. The patient is tolerating medications without difficulties and is otherwise without complaint today.    Atrial Fibrillation Risk Factors:  he does not have symptoms or diagnosis of sleep apnea. he does not have a history of rheumatic fever.   Atrial Fibrillation Management history:  Previous antiarrhythmic drugs: sotalol (failed with breakthrough), amiodarone (lung toxicity) Previous cardioversions: 06/30/23 Previous ablations: none Anticoagulation history: Eliquis  ROS- All systems are reviewed and negative except as per the HPI above.  Past Medical History:  Diagnosis Date   Acid reflux    AICD (automatic cardioverter/defibrillator) present    a. 08/2016 St. Jude (serial Number [id number])  biventricular ICD.   Anemia    Chronic systolic CHF (congestive heart failure) (HCC)    a. 08/2016 Echo: EF 20%, diffuse HK, inf, post AK, mod-sev MR, sev dil LA, mod TR, PASP .   COPD (chronic obstructive pulmonary disease) (HCC)    Coronary artery disease involving native coronary artery of native heart with angina pectoris (HCC)    a. 10/2012 MI after aortobifem bypass, found to have multivessel CAD -->CABG x3;  b. 08/2016 Cath: LM 60, LAD 72m, LCX 60ost/p, RCA 100p, VG->RPDA 50p, VG->OM1 ok, LIMA->LAD ok, EF 25%.   Essential hypertension 03/03/2012   History of blood transfusion 11/2012   "during his CABG"   History of gout X 1   History of stomach ulcers 1970s   Hyperlipidemia associated with type 2 diabetes mellitus (HCC) 09/16/2011   Ischemic cardiomyopathy    a. 08/2016 Echo: EF 20%, diffuse HK, inf, post AK;  b. 08/2016 St. Jude (serial Number [id number]) biventricular ICD.   Myocardial infarction [facility])    PAD (peripheral artery disease) (HCC)    s/p aortobifemoral bypass 10/2012   Peripheral neuropathy    PTSD (post-traumatic stress disorder)    Type II diabetes mellitus (HCC)    Ventricular tachycardia (HCC)    a. 08/2016 St. Jude (serial Number [id number]) biventricular ICD-->oral amio added.   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 Is now on chronic suppressive antibiotics with dicloxacillin    Current Outpatient Medications  Medication Sig Dispense Refill   acetaminophen (TYLENOL) 650 MG CR tablet Take 650 mg by mouth every 8 (eight) hours as needed for pain.     Alpha-Lipoic Acid 600 MG TABS Take 600  mg by mouth daily.     apixaban (ELIQUIS) 5 MG TABS tablet Take 1 tablet (5 mg total) by mouth 2 (two) times daily. 60 tablet 5   aspirin EC 81 MG tablet Take 81 mg by mouth daily.     atorvastatin (LIPITOR) 40 MG tablet Take 40 mg by mouth every  evening.     Cholecalciferol 50 MCG (2000 UT) TABS Take 2,000 Units by mouth daily.     dicloxacillin (DYNAPEN) 500 MG capsule Take 500 mg by mouth 2 (two) times daily.     digoxin (LANOXIN) 0.125 MG tablet Take 1 tablet (0.125 mg total) by mouth daily. Monday through Friday only, None on Saturday and Sunday 60 tablet 3   diphenhydrAMINE (BENADRYL) 25 mg capsule Take 25 mg by mouth at bedtime as needed for sleep.     empagliflozin (JARDIANCE) 25 MG TABS tablet Take 1 tablet (25 mg total) by mouth daily. 90 tablet 3   fexofenadine (ALLEGRA) 180 MG tablet Take 180 mg by mouth daily.     fluticasone (FLONASE) 50 MCG/ACT nasal spray Place 2 sprays into both nostrils daily as needed for allergies or rhinitis.     furosemide (LASIX) 40 MG tablet Take 1 tablet (40 mg total) by mouth daily. (Patient taking differently: Take 80 mg by mouth daily.) 94 tablet 3   glipiZIDE (GLUCOTROL) 5 MG tablet Take 5 mg by mouth 2 (two) times daily before a meal.     lidocaine (LIDODERM) 5 % Place 1 patch onto the skin daily as needed (For pain).     magnesium oxide (MAG-OX) 400 (241.3 Mg) MG tablet Take 1 tablet (400 mg total) by mouth daily. 30 tablet 6   metFORMIN (GLUCOPHAGE) 1000 MG tablet Take 1 tablet (1,000 mg total) by mouth 2 (two) times daily with a meal. Resume on 09/06/2016     metoprolol succinate (TOPROL XL) 25 MG 24 hr tablet Take 0.5 tablets (12.5 mg total) by mouth in the morning and at bedtime. 45 tablet 3   mexiletine (MEXITIL) 150 MG capsule Take 1 capsule (150 mg total) by mouth every 12 (twelve) hours. 60 capsule 5   miconazole (MICOTIN) 2 % cream Apply 1 Application topically 2 (two) times daily.     nitroGLYCERIN (NITROSTAT) 0.4 MG SL tablet Place 1 tablet (0.4 mg total) under the tongue every 5 (five) minutes x 3 doses as needed for chest pain. 25 tablet 3   Nutritional Supplements (ENSURE PLUS PO) Take 1-3 Cans by mouth 2 (two) times daily. Additional can, if  he is not eating a day     OVER THE  COUNTER MEDICATION Take 1 tablet by mouth See admin instructions. Relaxium every other night as needed     pantoprazole (PROTONIX) 40 MG tablet Take 1 tablet (40 mg total) by mouth daily. 90 tablet 2   potassium chloride SA (KLOR-CON M20) 20 MEQ tablet Take 1 tablet (20 mEq total) by mouth daily. 90 tablet 3   sitaGLIPtin (JANUVIA) 50 MG tablet Take 50 mg by mouth daily.     spironolactone (ALDACTONE) 25 MG tablet Take 0.5 tablets (12.5 mg total) by mouth daily.     Tiotropium Bromide Monohydrate (SPIRIVA RESPIMAT) 2.5 MCG/ACT AERS Inhale 2 puffs into the lungs at bedtime.     Vitamins A & D (VITAMIN A & D) ointment Apply 1 Application topically as needed for dry skin (on bottoms).     No current facility-administered medications for this encounter.    Physical Exam:  BP (!) 114/90   Pulse (!) 114   Ht 5\' 6"  (1.676 m)   Wt 57.6 kg   BMI 20.50 kg/m   GEN: Well nourished, well developed in no acute distress NECK: No JVD; No carotid bruits CARDIAC: Irregularly irregular rate and rhythm, no murmurs, rubs, gallops RESPIRATORY:  Clear to auscultation without rales, wheezing or rhonchi  ABDOMEN: Soft, non-tender, non-distended EXTREMITIES:  No edema; No deformity   Wt Readings from Last 3 Encounters:  08/04/23 57.6 kg  07/29/23 55.4 kg  07/14/23 55.8 kg     EKG today demonstrates  Afib, RBBB, PVCs Vent. rate 114 BPM PR interval * ms QRS duration 160 ms QT/QTcB 372/512 ms  Echo 07/29/23 demonstrated   1. Mid/inferior basal akinesis . Left ventricular ejection fraction, by  estimation, is 35 to 40%. The left ventricle has moderately decreased  function. The left ventricle demonstrates regional wall motion  abnormalities (see scoring diagram/findings for description). The left ventricular internal cavity size was moderately dilated. Left ventricular diastolic parameters are indeterminate.   2. Device wires in RA/RV. Right ventricular systolic function is  moderately reduced. The  right ventricular size is mildly enlarged.   3. Left atrial size was severely dilated.   4. Right atrial size was mildly dilated.   5. Apically displaced coaptation. Restricted posterior leaflet motion in  association with inferior RWMA suggests ischemic MR . The mitral valve is  abnormal. Severe mitral valve regurgitation.   6. Tricuspid valve regurgitation is moderate.   7. Calcified right and left cusps with restricted motion . The aortic  valve is calcified. There is moderate calcification of the aortic valve.  There is moderate thickening of the aortic valve. Aortic valve  regurgitation is mild. Aortic valve sclerosis  is present, with no evidence of aortic valve stenosis.    CHA2DS2-VASc Score = 6  The patient's score is based upon: CHF History: 1 HTN History: 1 Diabetes History: 1 Stroke History: 0 Vascular Disease History: 1 Age Score: 2 Gender Score: 0       ASSESSMENT AND PLAN: Persistent Atrial Fibrillation (ICD10:  I48.19) The patient's CHA2DS2-VASc score is 6, indicating a 9.7% annual risk of stroke.   Patient in afib with elevated rates today. Historically tolerates afib very poorly. After d/w Dr Excell Seltzer, will arrange for outpatient DCCV. Mitral valve surgery will either be postponed or performed on anticoagulation.  We did discuss having patient go to ED for evaluation and possible admission which he adamantly refused. ED precautions reviewed. His options for rhythm control are very limited. Continue Eliquis 5 mg BID Continue digoxin 0.125 mg daily  Secondary Hypercoagulable State (ICD10:  D68.69) The patient is at significant risk for stroke/thromboembolism based upon his CHA2DS2-VASc Score of 6.  Continue Apixaban (Eliquis).   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   Chronic HFrEF Ischemic CM S/p ICD Followed in the [facility] Fluid status improved on higher dose of Lasix but he tolerates afib  very poorly, will try to get him back in SR ASAP.    Follow up with the structural team post DCCV for mitral valve and with Dr Herbie Baltimore and [facility] as scheduled.    Informed Consent   Shared Decision Making/Informed Consent The risks (stroke, cardiac arrhythmias rarely resulting in the need for a temporary or permanent pacemaker, skin irritation or burns and complications associated with conscious sedation including aspiration, arrhythmia, respiratory failure and death), benefits (restoration  of normal sinus rhythm) and alternatives of a direct current cardioversion were explained in detail to Mr. Gutzmer and he agrees to proceed.        Jorja Loa PA-C Afib Clinic [facility] [address] [place], [place] [address] [phone]

## 2023-08-04 NOTE — Telephone Encounter (Signed)
The patient was seen in AFib Clinic today and scheduled for a DCCV 08/09/2023.  Discussed with Dr. Excell Seltzer in detail. The patient will proceed with DCCV as scheduled. Will reschedule MitraClip procedure to 08/24/2023 and only hold Eliquis the AM of the procedure. Will request an appointment with CHF Clinic after DCCV and prior to MitraClip.   Discussed with the patient's daughter at length. She is in agreement with above plan. Will keep her updated.

## 2023-08-05 ENCOUNTER — Other Ambulatory Visit (HOSPITAL_COMMUNITY): Payer: Self-pay | Admitting: *Deleted

## 2023-08-05 DIAGNOSIS — I4819 Other persistent atrial fibrillation: Secondary | ICD-10-CM

## 2023-08-06 ENCOUNTER — Inpatient Hospital Stay (HOSPITAL_COMMUNITY): Payer: Medicare Other

## 2023-08-06 ENCOUNTER — Other Ambulatory Visit: Payer: Self-pay

## 2023-08-06 ENCOUNTER — Emergency Department (HOSPITAL_COMMUNITY): Payer: Medicare Other

## 2023-08-06 ENCOUNTER — Encounter (HOSPITAL_COMMUNITY): Payer: Self-pay

## 2023-08-06 ENCOUNTER — Inpatient Hospital Stay (HOSPITAL_COMMUNITY)
Admission: EM | Admit: 2023-08-06 | Discharge: 2023-08-15 | DRG: 291 | Disposition: A | Discharge status: Hospice | Payer: Medicare Other | Attending: Internal Medicine | Admitting: Internal Medicine

## 2023-08-06 ENCOUNTER — Inpatient Hospital Stay (HOSPITAL_COMMUNITY): Payer: No Typology Code available for payment source

## 2023-08-06 DIAGNOSIS — R570 Cardiogenic shock: Secondary | ICD-10-CM | POA: Diagnosis not present

## 2023-08-06 DIAGNOSIS — Z95810 Presence of automatic (implantable) cardiac defibrillator: Secondary | ICD-10-CM

## 2023-08-06 DIAGNOSIS — D62 Acute posthemorrhagic anemia: Secondary | ICD-10-CM | POA: Diagnosis present

## 2023-08-06 DIAGNOSIS — J9621 Acute and chronic respiratory failure with hypoxia: Secondary | ICD-10-CM | POA: Diagnosis not present

## 2023-08-06 DIAGNOSIS — Z9181 History of falling: Secondary | ICD-10-CM | POA: Diagnosis not present

## 2023-08-06 DIAGNOSIS — N179 Acute kidney failure, unspecified: Secondary | ICD-10-CM | POA: Diagnosis not present

## 2023-08-06 DIAGNOSIS — I110 Hypertensive heart disease with heart failure: Secondary | ICD-10-CM | POA: Diagnosis not present

## 2023-08-06 DIAGNOSIS — R159 Full incontinence of feces: Secondary | ICD-10-CM | POA: Diagnosis not present

## 2023-08-06 DIAGNOSIS — K219 Gastro-esophageal reflux disease without esophagitis: Secondary | ICD-10-CM | POA: Diagnosis not present

## 2023-08-06 DIAGNOSIS — E872 Acidosis, unspecified: Secondary | ICD-10-CM | POA: Diagnosis not present

## 2023-08-06 DIAGNOSIS — I4819 Other persistent atrial fibrillation: Secondary | ICD-10-CM | POA: Diagnosis present

## 2023-08-06 DIAGNOSIS — J449 Chronic obstructive pulmonary disease, unspecified: Secondary | ICD-10-CM | POA: Diagnosis not present

## 2023-08-06 DIAGNOSIS — I2510 Atherosclerotic heart disease of native coronary artery without angina pectoris: Secondary | ICD-10-CM | POA: Diagnosis not present

## 2023-08-06 DIAGNOSIS — R5381 Other malaise: Secondary | ICD-10-CM | POA: Diagnosis not present

## 2023-08-06 DIAGNOSIS — K559 Vascular disorder of intestine, unspecified: Secondary | ICD-10-CM | POA: Diagnosis present

## 2023-08-06 DIAGNOSIS — Z8619 Personal history of other infectious and parasitic diseases: Secondary | ICD-10-CM

## 2023-08-06 DIAGNOSIS — Z792 Long term (current) use of antibiotics: Secondary | ICD-10-CM

## 2023-08-06 DIAGNOSIS — I447 Left bundle-branch block, unspecified: Secondary | ICD-10-CM | POA: Diagnosis present

## 2023-08-06 DIAGNOSIS — F4310 Post-traumatic stress disorder, unspecified: Secondary | ICD-10-CM | POA: Diagnosis not present

## 2023-08-06 DIAGNOSIS — Z79899 Other long term (current) drug therapy: Secondary | ICD-10-CM

## 2023-08-06 DIAGNOSIS — R109 Unspecified abdominal pain: Secondary | ICD-10-CM | POA: Diagnosis not present

## 2023-08-06 DIAGNOSIS — E1142 Type 2 diabetes mellitus with diabetic polyneuropathy: Secondary | ICD-10-CM | POA: Diagnosis present

## 2023-08-06 DIAGNOSIS — Z7984 Long term (current) use of oral hypoglycemic drugs: Secondary | ICD-10-CM

## 2023-08-06 DIAGNOSIS — E871 Hypo-osmolality and hyponatremia: Secondary | ICD-10-CM | POA: Diagnosis not present

## 2023-08-06 DIAGNOSIS — I472 Ventricular tachycardia, unspecified: Secondary | ICD-10-CM | POA: Diagnosis present

## 2023-08-06 DIAGNOSIS — Z66 Do not resuscitate: Secondary | ICD-10-CM | POA: Diagnosis present

## 2023-08-06 DIAGNOSIS — E785 Hyperlipidemia, unspecified: Secondary | ICD-10-CM | POA: Diagnosis present

## 2023-08-06 DIAGNOSIS — G9341 Metabolic encephalopathy: Secondary | ICD-10-CM | POA: Diagnosis present

## 2023-08-06 DIAGNOSIS — Z888 Allergy status to other drugs, medicaments and biological substances status: Secondary | ICD-10-CM

## 2023-08-06 DIAGNOSIS — I501 Left ventricular failure: Secondary | ICD-10-CM | POA: Diagnosis not present

## 2023-08-06 DIAGNOSIS — D509 Iron deficiency anemia, unspecified: Secondary | ICD-10-CM | POA: Diagnosis present

## 2023-08-06 DIAGNOSIS — R918 Other nonspecific abnormal finding of lung field: Secondary | ICD-10-CM | POA: Diagnosis not present

## 2023-08-06 DIAGNOSIS — I5084 End stage heart failure: Secondary | ICD-10-CM | POA: Diagnosis not present

## 2023-08-06 DIAGNOSIS — R7401 Elevation of levels of liver transaminase levels: Secondary | ICD-10-CM | POA: Diagnosis not present

## 2023-08-06 DIAGNOSIS — I5022 Chronic systolic (congestive) heart failure: Secondary | ICD-10-CM | POA: Diagnosis not present

## 2023-08-06 DIAGNOSIS — E43 Unspecified severe protein-calorie malnutrition: Secondary | ICD-10-CM | POA: Diagnosis present

## 2023-08-06 DIAGNOSIS — Z4682 Encounter for fitting and adjustment of non-vascular catheter: Secondary | ICD-10-CM | POA: Diagnosis not present

## 2023-08-06 DIAGNOSIS — I2489 Other forms of acute ischemic heart disease: Secondary | ICD-10-CM | POA: Diagnosis not present

## 2023-08-06 DIAGNOSIS — I340 Nonrheumatic mitral (valve) insufficiency: Secondary | ICD-10-CM | POA: Diagnosis present

## 2023-08-06 DIAGNOSIS — I4891 Unspecified atrial fibrillation: Secondary | ICD-10-CM | POA: Diagnosis not present

## 2023-08-06 DIAGNOSIS — E1165 Type 2 diabetes mellitus with hyperglycemia: Secondary | ICD-10-CM | POA: Diagnosis present

## 2023-08-06 DIAGNOSIS — R079 Chest pain, unspecified: Secondary | ICD-10-CM | POA: Diagnosis not present

## 2023-08-06 DIAGNOSIS — I252 Old myocardial infarction: Secondary | ICD-10-CM | POA: Diagnosis not present

## 2023-08-06 DIAGNOSIS — Z7982 Long term (current) use of aspirin: Secondary | ICD-10-CM

## 2023-08-06 DIAGNOSIS — E278 Other specified disorders of adrenal gland: Secondary | ICD-10-CM | POA: Diagnosis not present

## 2023-08-06 DIAGNOSIS — F05 Delirium due to known physiological condition: Secondary | ICD-10-CM | POA: Diagnosis not present

## 2023-08-06 DIAGNOSIS — I5023 Acute on chronic systolic (congestive) heart failure: Secondary | ICD-10-CM

## 2023-08-06 DIAGNOSIS — Z452 Encounter for adjustment and management of vascular access device: Secondary | ICD-10-CM | POA: Diagnosis not present

## 2023-08-06 DIAGNOSIS — R140 Abdominal distension (gaseous): Secondary | ICD-10-CM | POA: Diagnosis not present

## 2023-08-06 DIAGNOSIS — R001 Bradycardia, unspecified: Secondary | ICD-10-CM | POA: Diagnosis present

## 2023-08-06 DIAGNOSIS — E861 Hypovolemia: Secondary | ICD-10-CM | POA: Diagnosis present

## 2023-08-06 DIAGNOSIS — E876 Hypokalemia: Secondary | ICD-10-CM | POA: Diagnosis not present

## 2023-08-06 DIAGNOSIS — Z7189 Other specified counseling: Secondary | ICD-10-CM | POA: Diagnosis not present

## 2023-08-06 DIAGNOSIS — F17290 Nicotine dependence, other tobacco product, uncomplicated: Secondary | ICD-10-CM | POA: Diagnosis present

## 2023-08-06 DIAGNOSIS — I255 Ischemic cardiomyopathy: Secondary | ICD-10-CM | POA: Diagnosis present

## 2023-08-06 DIAGNOSIS — R32 Unspecified urinary incontinence: Secondary | ICD-10-CM | POA: Diagnosis not present

## 2023-08-06 DIAGNOSIS — R64 Cachexia: Secondary | ICD-10-CM | POA: Diagnosis not present

## 2023-08-06 DIAGNOSIS — I493 Ventricular premature depolarization: Secondary | ICD-10-CM | POA: Diagnosis present

## 2023-08-06 DIAGNOSIS — E1151 Type 2 diabetes mellitus with diabetic peripheral angiopathy without gangrene: Secondary | ICD-10-CM | POA: Diagnosis not present

## 2023-08-06 DIAGNOSIS — I480 Paroxysmal atrial fibrillation: Secondary | ICD-10-CM | POA: Diagnosis not present

## 2023-08-06 DIAGNOSIS — Z720 Tobacco use: Secondary | ICD-10-CM | POA: Diagnosis not present

## 2023-08-06 DIAGNOSIS — I517 Cardiomegaly: Secondary | ICD-10-CM | POA: Diagnosis not present

## 2023-08-06 DIAGNOSIS — R531 Weakness: Secondary | ICD-10-CM | POA: Diagnosis not present

## 2023-08-06 DIAGNOSIS — R54 Age-related physical debility: Secondary | ICD-10-CM | POA: Diagnosis present

## 2023-08-06 DIAGNOSIS — Z7401 Bed confinement status: Secondary | ICD-10-CM | POA: Diagnosis not present

## 2023-08-06 DIAGNOSIS — I5043 Acute on chronic combined systolic (congestive) and diastolic (congestive) heart failure: Secondary | ICD-10-CM | POA: Diagnosis present

## 2023-08-06 DIAGNOSIS — Z7901 Long term (current) use of anticoagulants: Secondary | ICD-10-CM

## 2023-08-06 DIAGNOSIS — R627 Adult failure to thrive: Secondary | ICD-10-CM | POA: Diagnosis present

## 2023-08-06 DIAGNOSIS — Z515 Encounter for palliative care: Secondary | ICD-10-CM | POA: Diagnosis not present

## 2023-08-06 DIAGNOSIS — J9601 Acute respiratory failure with hypoxia: Secondary | ICD-10-CM | POA: Diagnosis not present

## 2023-08-06 DIAGNOSIS — K7200 Acute and subacute hepatic failure without coma: Secondary | ICD-10-CM | POA: Diagnosis present

## 2023-08-06 DIAGNOSIS — Z951 Presence of aortocoronary bypass graft: Secondary | ICD-10-CM

## 2023-08-06 DIAGNOSIS — I872 Venous insufficiency (chronic) (peripheral): Secondary | ICD-10-CM | POA: Diagnosis present

## 2023-08-06 DIAGNOSIS — K5730 Diverticulosis of large intestine without perforation or abscess without bleeding: Secondary | ICD-10-CM | POA: Diagnosis not present

## 2023-08-06 DIAGNOSIS — F17200 Nicotine dependence, unspecified, uncomplicated: Secondary | ICD-10-CM | POA: Diagnosis not present

## 2023-08-06 DIAGNOSIS — R188 Other ascites: Secondary | ICD-10-CM | POA: Diagnosis not present

## 2023-08-06 DIAGNOSIS — Z681 Body mass index (BMI) 19 or less, adult: Secondary | ICD-10-CM | POA: Diagnosis not present

## 2023-08-06 DIAGNOSIS — E1169 Type 2 diabetes mellitus with other specified complication: Secondary | ICD-10-CM | POA: Diagnosis present

## 2023-08-06 DIAGNOSIS — D649 Anemia, unspecified: Secondary | ICD-10-CM | POA: Diagnosis not present

## 2023-08-06 DIAGNOSIS — K6389 Other specified diseases of intestine: Secondary | ICD-10-CM | POA: Diagnosis not present

## 2023-08-06 DIAGNOSIS — I5021 Acute systolic (congestive) heart failure: Secondary | ICD-10-CM | POA: Diagnosis not present

## 2023-08-06 DIAGNOSIS — Q2381 Bicuspid aortic valve: Secondary | ICD-10-CM

## 2023-08-06 DIAGNOSIS — Z8711 Personal history of peptic ulcer disease: Secondary | ICD-10-CM

## 2023-08-06 LAB — CBC WITH DIFFERENTIAL/PLATELET
Abs Immature Granulocytes: 0.05 10*3/uL (ref 0.00–0.07)
Basophils Absolute: 0 10*3/uL (ref 0.0–0.1)
Basophils Relative: 0 %
Eosinophils Absolute: 0 10*3/uL (ref 0.0–0.5)
Eosinophils Relative: 0 %
HCT: 32.1 % — ABNORMAL LOW (ref 39.0–52.0)
Hemoglobin: 9.8 g/dL — ABNORMAL LOW (ref 13.0–17.0)
Immature Granulocytes: 1 %
Lymphocytes Relative: 7 %
Lymphs Abs: 0.7 10*3/uL (ref 0.7–4.0)
MCH: 24 pg — ABNORMAL LOW (ref 26.0–34.0)
MCHC: 30.5 g/dL (ref 30.0–36.0)
MCV: 78.7 fL — ABNORMAL LOW (ref 80.0–100.0)
Monocytes Absolute: 1.1 10*3/uL — ABNORMAL HIGH (ref 0.1–1.0)
Monocytes Relative: 11 %
Neutro Abs: 8.3 10*3/uL — ABNORMAL HIGH (ref 1.7–7.7)
Neutrophils Relative %: 81 %
Platelets: 232 10*3/uL (ref 150–400)
RBC: 4.08 MIL/uL — ABNORMAL LOW (ref 4.22–5.81)
RDW: 17.5 % — ABNORMAL HIGH (ref 11.5–15.5)
WBC: 10.1 10*3/uL (ref 4.0–10.5)
nRBC: 0.6 % — ABNORMAL HIGH (ref 0.0–0.2)

## 2023-08-06 LAB — COMPREHENSIVE METABOLIC PANEL
ALT: 852 U/L — ABNORMAL HIGH (ref 0–44)
AST: 924 U/L — ABNORMAL HIGH (ref 15–41)
Albumin: 3.3 g/dL — ABNORMAL LOW (ref 3.5–5.0)
Alkaline Phosphatase: 85 U/L (ref 38–126)
Anion gap: 19 — ABNORMAL HIGH (ref 5–15)
BUN: 52 mg/dL — ABNORMAL HIGH (ref 8–23)
CO2: 14 mmol/L — ABNORMAL LOW (ref 22–32)
Calcium: 8 mg/dL — ABNORMAL LOW (ref 8.9–10.3)
Chloride: 96 mmol/L — ABNORMAL LOW (ref 98–111)
Creatinine, Ser: 1.34 mg/dL — ABNORMAL HIGH (ref 0.61–1.24)
GFR, Estimated: 55 mL/min — ABNORMAL LOW (ref >60)
Glucose, Bld: 179 mg/dL — ABNORMAL HIGH (ref 70–99)
Potassium: 5.3 mmol/L — ABNORMAL HIGH (ref 3.5–5.1)
Sodium: 129 mmol/L — ABNORMAL LOW (ref 135–145)
Total Bilirubin: 0.6 mg/dL (ref <1.2)
Total Protein: 5.8 g/dL — ABNORMAL LOW (ref 6.5–8.1)

## 2023-08-06 LAB — TYPE AND SCREEN
ABO/RH(D): B NEG
Antibody Screen: NEGATIVE

## 2023-08-06 LAB — I-STAT CG4 LACTIC ACID, ED
Lactic Acid, Venous: 7.8 mmol/L (ref 0.5–1.9)
Lactic Acid, Venous: 8.7 mmol/L (ref 0.5–1.9)

## 2023-08-06 LAB — MAGNESIUM: Magnesium: 2.1 mg/dL (ref 1.7–2.4)

## 2023-08-06 LAB — I-STAT CHEM 8, ED
BUN: 47 mg/dL — ABNORMAL HIGH (ref 8–23)
Calcium, Ion: 0.79 mmol/L — CL (ref 1.15–1.40)
Chloride: 101 mmol/L (ref 98–111)
Creatinine, Ser: 1.1 mg/dL (ref 0.61–1.24)
Glucose, Bld: 157 mg/dL — ABNORMAL HIGH (ref 70–99)
HCT: 35 % — ABNORMAL LOW (ref 39.0–52.0)
Hemoglobin: 11.9 g/dL — ABNORMAL LOW (ref 13.0–17.0)
Potassium: 4.7 mmol/L (ref 3.5–5.1)
Sodium: 129 mmol/L — ABNORMAL LOW (ref 135–145)
TCO2: 13 mmol/L — ABNORMAL LOW (ref 22–32)

## 2023-08-06 LAB — I-STAT VENOUS BLOOD GAS, ED
Acid-base deficit: 9 mmol/L — ABNORMAL HIGH (ref 0.0–2.0)
Bicarbonate: 16.8 mmol/L — ABNORMAL LOW (ref 20.0–28.0)
Calcium, Ion: 1.02 mmol/L — ABNORMAL LOW (ref 1.15–1.40)
HCT: 39 % (ref 39.0–52.0)
Hemoglobin: 13.3 g/dL (ref 13.0–17.0)
O2 Saturation: 56 %
Potassium: 5.2 mmol/L — ABNORMAL HIGH (ref 3.5–5.1)
Sodium: 126 mmol/L — ABNORMAL LOW (ref 135–145)
TCO2: 18 mmol/L — ABNORMAL LOW (ref 22–32)
pCO2, Ven: 34 mm[Hg] — ABNORMAL LOW (ref 44–60)
pH, Ven: 7.301 (ref 7.25–7.43)
pO2, Ven: 32 mm[Hg] (ref 32–45)

## 2023-08-06 LAB — PROTIME-INR
INR: 3.1 — ABNORMAL HIGH (ref 0.8–1.2)
Prothrombin Time: 32.1 s — ABNORMAL HIGH (ref 11.4–15.2)

## 2023-08-06 LAB — COOXEMETRY PANEL
Carboxyhemoglobin: 0.9 % (ref 0.5–1.5)
Methemoglobin: <0.7 % (ref 0.0–1.5)
O2 Saturation: 25.8 %
Total hemoglobin: 11 g/dL — ABNORMAL LOW (ref 12.0–16.0)

## 2023-08-06 LAB — CBC
HCT: 34.3 % — ABNORMAL LOW (ref 39.0–52.0)
Hemoglobin: 10.4 g/dL — ABNORMAL LOW (ref 13.0–17.0)
MCH: 23.7 pg — ABNORMAL LOW (ref 26.0–34.0)
MCHC: 30.3 g/dL (ref 30.0–36.0)
MCV: 78.1 fL — ABNORMAL LOW (ref 80.0–100.0)
Platelets: 261 10*3/uL (ref 150–400)
RBC: 4.39 MIL/uL (ref 4.22–5.81)
RDW: 17.6 % — ABNORMAL HIGH (ref 11.5–15.5)
WBC: 12.2 10*3/uL — ABNORMAL HIGH (ref 4.0–10.5)
nRBC: 0.8 % — ABNORMAL HIGH (ref 0.0–0.2)

## 2023-08-06 LAB — GLUCOSE, CAPILLARY
Glucose-Capillary: 233 mg/dL — ABNORMAL HIGH (ref 70–99)
Glucose-Capillary: 161 mg/dL — ABNORMAL HIGH (ref 70–99)

## 2023-08-06 LAB — TROPONIN I (HIGH SENSITIVITY)
Troponin I (High Sensitivity): 94 ng/L — ABNORMAL HIGH (ref <18)
Troponin I (High Sensitivity): 87 ng/L — ABNORMAL HIGH (ref <18)

## 2023-08-06 LAB — LIPASE, BLOOD: Lipase: 34 U/L (ref 11–51)

## 2023-08-06 LAB — PROCALCITONIN: Procalcitonin: 0.1 ng/mL

## 2023-08-06 LAB — BRAIN NATRIURETIC PEPTIDE: B Natriuretic Peptide: 2725.8 pg/mL — ABNORMAL HIGH (ref 0.0–100.0)

## 2023-08-06 LAB — MRSA NEXT GEN BY PCR, NASAL: MRSA by PCR Next Gen: NOT DETECTED

## 2023-08-06 MED ORDER — AMIODARONE HCL IN DEXTROSE 360-4.14 MG/200ML-% IV SOLN
60.0000 mg/h | INTRAVENOUS | Status: AC
  Administered 2023-08-06: 60 mg/h via INTRAVENOUS
  Filled 2023-08-06 (×3): qty 200

## 2023-08-06 MED ORDER — PIPERACILLIN-TAZOBACTAM 3.375 G IVPB 30 MIN
3.3750 g | Freq: Once | INTRAVENOUS | Status: AC
  Administered 2023-08-06: 3.375 g via INTRAVENOUS
  Filled 2023-08-06: qty 50

## 2023-08-06 MED ORDER — IOHEXOL 350 MG/ML SOLN
75.0000 mL | Freq: Once | INTRAVENOUS | Status: AC | PRN
  Administered 2023-08-06: 75 mL via INTRAVENOUS

## 2023-08-06 MED ORDER — ACETAMINOPHEN 325 MG PO TABS: 650.0000 mg | ORAL_TABLET | Freq: Four times a day (QID) | ORAL | Status: DC | PRN

## 2023-08-06 MED ORDER — AMIODARONE LOAD VIA INFUSION
150.0000 mg | Freq: Once | INTRAVENOUS | Status: AC
  Administered 2023-08-06: 150 mg via INTRAVENOUS
  Filled 2023-08-06: qty 83.34

## 2023-08-06 MED ORDER — IOHEXOL 350 MG/ML SOLN
55.0000 mL | Freq: Once | INTRAVENOUS | Status: AC | PRN
  Administered 2023-08-06: 55 mL via INTRAVENOUS

## 2023-08-06 MED ORDER — NICOTINE 21 MG/24HR TD PT24
21.0000 mg | MEDICATED_PATCH | Freq: Every day | TRANSDERMAL | Status: DC
  Administered 2023-08-06 – 2023-08-15 (×10): 21 mg via TRANSDERMAL
  Filled 2023-08-06 (×10): qty 1

## 2023-08-06 MED ORDER — CALCIUM GLUCONATE-NACL 1-0.675 GM/50ML-% IV SOLN
1.0000 g | Freq: Once | INTRAVENOUS | Status: AC
  Administered 2023-08-06: 1000 mg via INTRAVENOUS
  Filled 2023-08-06: qty 50

## 2023-08-06 MED ORDER — MILRINONE LACTATE IN DEXTROSE 20-5 MG/100ML-% IV SOLN
0.2500 ug/kg/min | INTRAVENOUS | Status: DC
  Administered 2023-08-06 (×2): 0.125 ug/kg/min via INTRAVENOUS
  Administered 2023-08-07 – 2023-08-11 (×7): 0.375 ug/kg/min via INTRAVENOUS
  Administered 2023-08-11 – 2023-08-12 (×2): 0.25 ug/kg/min via INTRAVENOUS
  Administered 2023-08-14: 0.125 ug/kg/min via INTRAVENOUS
  Filled 2023-08-06 (×12): qty 100

## 2023-08-06 MED ORDER — INSULIN ASPART 100 UNIT/ML IJ SOLN
0.0000 [IU] | INTRAMUSCULAR | Status: DC
  Administered 2023-08-06 – 2023-08-07 (×4): 2 [IU] via SUBCUTANEOUS
  Administered 2023-08-07: 1 [IU] via SUBCUTANEOUS
  Administered 2023-08-07: 3 [IU] via SUBCUTANEOUS
  Administered 2023-08-08: 7 [IU] via SUBCUTANEOUS
  Administered 2023-08-08: 2 [IU] via SUBCUTANEOUS
  Administered 2023-08-08 (×3): 1 [IU] via SUBCUTANEOUS
  Administered 2023-08-08: 7 [IU] via SUBCUTANEOUS
  Administered 2023-08-08: 2 [IU] via SUBCUTANEOUS
  Administered 2023-08-09: 9 [IU] via SUBCUTANEOUS
  Administered 2023-08-09: 2 [IU] via SUBCUTANEOUS
  Administered 2023-08-09: 9 [IU] via SUBCUTANEOUS
  Administered 2023-08-09: 1 [IU] via SUBCUTANEOUS
  Administered 2023-08-09: 5 [IU] via SUBCUTANEOUS
  Administered 2023-08-09: 3 [IU] via SUBCUTANEOUS
  Administered 2023-08-10: 7 [IU] via SUBCUTANEOUS
  Administered 2023-08-10: 3 [IU] via SUBCUTANEOUS
  Administered 2023-08-10: 5 [IU] via SUBCUTANEOUS
  Administered 2023-08-10: 3 [IU] via SUBCUTANEOUS
  Administered 2023-08-10: 5 [IU] via SUBCUTANEOUS
  Administered 2023-08-10: 3 [IU] via SUBCUTANEOUS
  Administered 2023-08-11: 2 [IU] via SUBCUTANEOUS
  Administered 2023-08-11: 7 [IU] via SUBCUTANEOUS

## 2023-08-06 MED ORDER — SODIUM CHLORIDE 0.9 % IV SOLN
250.0000 mL | INTRAVENOUS | Status: DC
  Administered 2023-08-06: 250 mL via INTRAVENOUS

## 2023-08-06 MED ORDER — PIPERACILLIN-TAZOBACTAM 3.375 G IVPB
3.3750 g | Freq: Three times a day (TID) | INTRAVENOUS | Status: AC
  Administered 2023-08-06 – 2023-08-12 (×19): 3.375 g via INTRAVENOUS
  Filled 2023-08-06 (×19): qty 50

## 2023-08-06 MED ORDER — DOCUSATE SODIUM 100 MG PO CAPS
100.0000 mg | ORAL_CAPSULE | Freq: Two times a day (BID) | ORAL | Status: DC | PRN
  Administered 2023-08-12 – 2023-08-14 (×5): 100 mg via ORAL
  Filled 2023-08-06 (×4): qty 1

## 2023-08-06 MED ORDER — NOREPINEPHRINE 4 MG/250ML-% IV SOLN: 0.0000 ug/min | INTRAVENOUS | Status: DC

## 2023-08-06 MED ORDER — HYDROMORPHONE HCL 1 MG/ML IJ SOLN
0.5000 mg | INTRAMUSCULAR | Status: DC | PRN
  Administered 2023-08-06: 0.5 mg via INTRAVENOUS
  Filled 2023-08-06: qty 0.5

## 2023-08-06 MED ORDER — AMIODARONE HCL IN DEXTROSE 360-4.14 MG/200ML-% IV SOLN
30.0000 mg/h | INTRAVENOUS | Status: DC
  Administered 2023-08-06: 30 mg/h via INTRAVENOUS
  Administered 2023-08-07 (×2): 60 mg/h via INTRAVENOUS
  Administered 2023-08-07: 30 mg/h via INTRAVENOUS
  Administered 2023-08-08 – 2023-08-12 (×17): 60 mg/h via INTRAVENOUS
  Administered 2023-08-12 – 2023-08-15 (×6): 30 mg/h via INTRAVENOUS
  Filled 2023-08-06 (×12): qty 200
  Filled 2023-08-06: qty 400
  Filled 2023-08-06 (×12): qty 200

## 2023-08-06 MED ORDER — NOREPINEPHRINE 4 MG/250ML-% IV SOLN
2.0000 ug/min | INTRAVENOUS | Status: DC
Start: 2023-08-06 — End: 2023-08-07
  Administered 2023-08-06: 2 ug/min via INTRAVENOUS
  Filled 2023-08-06 (×2): qty 250

## 2023-08-06 MED ORDER — POLYETHYLENE GLYCOL 3350 17 G PO PACK
17.0000 g | PACK | Freq: Every day | ORAL | Status: DC | PRN
  Administered 2023-08-14: 17 g via ORAL
  Filled 2023-08-06: qty 1

## 2023-08-06 MED ORDER — VANCOMYCIN HCL IN DEXTROSE 1-5 GM/200ML-% IV SOLN
1000.0000 mg | Freq: Once | INTRAVENOUS | Status: AC
  Administered 2023-08-06: 1000 mg via INTRAVENOUS
  Filled 2023-08-06: qty 200

## 2023-08-06 MED ORDER — SODIUM CHLORIDE 0.9% FLUSH
10.0000 mL | Freq: Two times a day (BID) | INTRAVENOUS | Status: DC
  Administered 2023-08-06 – 2023-08-12 (×10): 10 mL via INTRAVENOUS

## 2023-08-06 NOTE — ED Notes (Signed)
RN called CCMD to be placed on monitoring

## 2023-08-06 NOTE — ED Provider Notes (Signed)
[place] [facility] AT [facility] Provider Note   CSN: [id number] Arrival date & time: 08/06/23  1359     History  Chief Complaint  Patient presents with   Morning Sickness   Emesis   Bradycardia    [name] [name] is a 77 y.o. male.  He has significant cardiac disease paroxysmal A-fib CHF mitral valve disease diabetes.  He is supposed to be getting a mitral valve repair done this week.  He is progressively declining at home, not eating, feeling nauseous, having diarrhea for the last few days.  He denies any chest pain or shortness of breath.  Wife tells me that cardiology told her to bring him to the hospital if he gets any worse over the weekend.  No fevers.  He tells me his lower extremities are often mottled but this looks a little worse than usual.  The history is provided by the patient.  Weakness Severity:  Severe Onset quality:  Gradual Timing:  Constant Progression:  Worsening Relieved by:  Nothing Worsened by:  Activity Ineffective treatments:  Rest Associated symptoms: diarrhea, difficulty walking and nausea   Associated symptoms: no abdominal pain, no chest pain, no dysuria, no fever, no shortness of breath and no vomiting   Risk factors: congestive heart failure and coronary artery disease        Home Medications Prior to Admission medications   Medication Sig Start Date End Date Taking? Authorizing Provider  acetaminophen (TYLENOL) 650 MG CR tablet Take 650 mg by mouth every 8 (eight) hours as needed for pain.    [provider]  Alpha-Lipoic Acid 600 MG TABS Take 600 mg by mouth daily.    [provider]  apixaban (ELIQUIS) 5 MG TABS tablet Take 1 tablet (5 mg total) by mouth 2 (two) times daily. 02/18/23   Camnitz, Andree Coss, MD  aspirin EC 81 MG tablet Take 81 mg by mouth daily.    [provider]  atorvastatin (LIPITOR) 40 MG tablet Take 40 mg by mouth every evening. 10/29/22   [provider]   Cholecalciferol 50 MCG (2000 UT) TABS Take 2,000 Units by mouth daily. 10/29/22   [provider]  dicloxacillin (DYNAPEN) 500 MG capsule Take 500 mg by mouth 2 (two) times daily.    [provider]  digoxin (LANOXIN) 0.125 MG tablet Take 1 tablet (0.125 mg total) by mouth daily. Monday through Friday only, None on Saturday and Sunday 06/13/23   Jacklynn Ganong, FNP  diphenhydrAMINE (BENADRYL) 25 mg capsule Take 25 mg by mouth at bedtime as needed for sleep.    [provider]  empagliflozin (JARDIANCE) 25 MG TABS tablet Take 1 tablet (25 mg total) by mouth daily. 07/14/23   Andrey Farmer, PA-C  fexofenadine (ALLEGRA) 180 MG tablet Take 180 mg by mouth daily.    [provider]  fluticasone (FLONASE) 50 MCG/ACT nasal spray Place 2 sprays into both nostrils daily as needed for allergies or rhinitis.    [provider]  furosemide (LASIX) 40 MG tablet Take 1 tablet (40 mg total) by mouth daily. Patient taking differently: Take 80 mg by mouth daily. 07/14/23   Andrey Farmer, PA-C  glipiZIDE (GLUCOTROL) 5 MG tablet Take 5 mg by mouth 2 (two) times daily before a meal.    [provider]  lidocaine (LIDODERM) 5 % Place 1 patch onto the skin daily as needed (For pain). 10/14/22   [provider]  magnesium oxide (MAG-OX)  400 (241.3 Mg) MG tablet Take 1 tablet (400 mg total) by mouth daily. 09/04/16   Creig Hines, NP  metFORMIN (GLUCOPHAGE) 1000 MG tablet Take 1 tablet (1,000 mg total) by mouth 2 (two) times daily with a meal. Resume on 09/06/2016 09/04/16   Creig Hines, NP  metoprolol succinate (TOPROL XL) 25 MG 24 hr tablet Take 0.5 tablets (12.5 mg total) by mouth in the morning and at bedtime. 07/14/23   Andrey Farmer, PA-C  mexiletine (MEXITIL) 150 MG capsule Take 1 capsule (150 mg total) by mouth every 12 (twelve) hours. 02/18/23   Camnitz, Will Daphine Deutscher, MD  miconazole (MICOTIN) 2 % cream  Apply 1 Application topically 2 (two) times daily. 07/28/23   [provider]  nitroGLYCERIN (NITROSTAT) 0.4 MG SL tablet Place 1 tablet (0.4 mg total) under the tongue every 5 (five) minutes x 3 doses as needed for chest pain. 09/04/16   Creig Hines, NP  Nutritional Supplements (ENSURE PLUS PO) Take 1-3 Cans by mouth 2 (two) times daily. Additional can, if  he is not eating a day    [provider]  OVER THE COUNTER MEDICATION Take 1 tablet by mouth See admin instructions. Relaxium every other night as needed    [provider]  pantoprazole (PROTONIX) 40 MG tablet Take 1 tablet (40 mg total) by mouth daily. 05/22/21   Marykay Lex, MD  potassium chloride SA (KLOR-CON M20) 20 MEQ tablet Take 1 tablet (20 mEq total) by mouth daily. 08/03/23 11/01/23  Jacklynn Ganong, FNP  sitaGLIPtin (JANUVIA) 50 MG tablet Take 50 mg by mouth daily.    [provider]  spironolactone (ALDACTONE) 25 MG tablet Take 0.5 tablets (12.5 mg total) by mouth daily. 05/02/23   Laurey Morale, MD  Tiotropium Bromide Monohydrate (SPIRIVA RESPIMAT) 2.5 MCG/ACT AERS Inhale 2 puffs into the lungs at bedtime.    [provider]  Vitamins A & D (VITAMIN A & D) ointment Apply 1 Application topically as needed for dry skin (on bottoms).    [provider]      Allergies    Lisinopril    Review of Systems   Review of Systems  Constitutional:  Negative for fever.  Respiratory:  Negative for shortness of breath.   Cardiovascular:  Negative for chest pain.  Gastrointestinal:  Positive for diarrhea and nausea. Negative for abdominal pain and vomiting.  Genitourinary:  Negative for dysuria.  Neurological:  Positive for weakness.    Physical Exam Updated Vital Signs BP 107/78   Pulse (!) 137   Temp 98 F (36.7 C) (Oral)   Resp (!) 27   Ht 5\' 6"  (1.676 m)   Wt 57.6 kg   SpO2 100%   BMI 20.50 kg/m  Physical Exam Vitals and nursing note reviewed.   Constitutional:      General: He is not in acute distress.    Appearance: He is well-developed. He is ill-appearing.  HENT:     Head: Normocephalic and atraumatic.  Eyes:     Conjunctiva/sclera: Conjunctivae normal.  Cardiovascular:     Rate and Rhythm: Regular rhythm. Tachycardia present.     Heart sounds: Murmur heard.  Pulmonary:     Effort: Pulmonary effort is normal. No respiratory distress.     Breath sounds: Normal breath sounds.  Abdominal:     Palpations: Abdomen is soft.     Tenderness: There is no abdominal tenderness. There is no guarding or rebound.  Musculoskeletal:  General: Normal range of motion.     Cervical back: Neck supple.  Skin:    General: Skin is dry.     Capillary Refill: Capillary refill takes less than 2 seconds.     Comments: Has cool and mottled feet and there is some mottling over his knees.  Neurological:     General: No focal deficit present.     Mental Status: He is alert.     ED Results / Procedures / Treatments   Labs (all labs ordered are listed, but only abnormal results are displayed) Labs Reviewed  COMPREHENSIVE METABOLIC PANEL - Abnormal; Notable for the following components:      Result Value   Sodium 129 (*)    Potassium 5.3 (*)    Chloride 96 (*)    CO2 14 (*)    Glucose, Bld 179 (*)    BUN 52 (*)    Creatinine, Ser 1.34 (*)    Calcium 8.0 (*)    Total Protein 5.8 (*)    Albumin 3.3 (*)    AST 924 (*)    ALT 852 (*)    GFR, Estimated 55 (*)    Anion gap 19 (*)    All other components within normal limits  BRAIN NATRIURETIC PEPTIDE - Abnormal; Notable for the following components:   B Natriuretic Peptide 2,725.8 (*)    All other components within normal limits  CBC WITH DIFFERENTIAL/PLATELET - Abnormal; Notable for the following components:   RBC 4.08 (*)    Hemoglobin 9.8 (*)    HCT 32.1 (*)    MCV 78.7 (*)    MCH 24.0 (*)    RDW 17.5 (*)    nRBC 0.6 (*)    Neutro Abs 8.3 (*)    Monocytes Absolute 1.1  (*)    All other components within normal limits  PROTIME-INR - Abnormal; Notable for the following components:   Prothrombin Time 32.1 (*)    INR 3.1 (*)    All other components within normal limits  I-STAT CG4 LACTIC ACID, ED - Abnormal; Notable for the following components:   Lactic Acid, Venous 7.8 (*)    All other components within normal limits  I-STAT CHEM 8, ED - Abnormal; Notable for the following components:   Sodium 129 (*)    BUN 47 (*)    Glucose, Bld 157 (*)    Calcium, Ion 0.79 (*)    TCO2 13 (*)    Hemoglobin 11.9 (*)    HCT 35.0 (*)    All other components within normal limits  I-STAT VENOUS BLOOD GAS, ED - Abnormal; Notable for the following components:   pCO2, Ven 34.0 (*)    Bicarbonate 16.8 (*)    TCO2 18 (*)    Acid-base deficit 9.0 (*)    Sodium 126 (*)    Potassium 5.2 (*)    Calcium, Ion 1.02 (*)    All other components within normal limits  I-STAT CG4 LACTIC ACID, ED - Abnormal; Notable for the following components:   Lactic Acid, Venous 8.7 (*)    All other components within normal limits  TROPONIN I (HIGH SENSITIVITY) - Abnormal; Notable for the following components:   Troponin I (High Sensitivity) 94 (*)    All other components within normal limits  TROPONIN I (HIGH SENSITIVITY) - Abnormal; Notable for the following components:   Troponin I (High Sensitivity) 87 (*)    All other components within normal limits  CULTURE, BLOOD (ROUTINE X 2)  CULTURE, BLOOD (ROUTINE X 2)  LIPASE, BLOOD  MAGNESIUM  URINALYSIS, ROUTINE W REFLEX MICROSCOPIC  CBC  BASIC METABOLIC PANEL  MAGNESIUM  PHOSPHORUS  HEPATIC FUNCTION PANEL  PROCALCITONIN  POC OCCULT BLOOD, ED  TYPE AND SCREEN    EKG None  Radiology DG Chest Port 1 View  Result Date: 08/06/2023 CLINICAL DATA:  Increased weakness EXAM: PORTABLE CHEST 1 VIEW COMPARISON:  Chest radiograph dated 06/30/2023 FINDINGS: Lines/tubes: Left chest wall ICD leads project over the right atrium and ventricle  and tributary of the coronary sinus. Lungs: Well inflated lungs. No focal consolidation. Mild interstitial opacities. Pleura: No pneumothorax or pleural effusion. Heart/mediastinum: Similar mildly enlarged cardiomediastinal silhouette. Bones: Postsurgical changes of the sternum. Similar fractured inferior sternotomy wire. IMPRESSION: 1. Mild interstitial opacities, which may represent pulmonary edema. 2. Similar mild cardiomegaly. Electronically Signed   By: Agustin Cree M.D.   On: 08/06/2023 16:52   [place] EKG SITE RITE  Result Date: 08/06/2023 If Site Rite image not attached, placement could not be confirmed due to current cardiac rhythm.   Procedures .Critical Care  Performed by: Terrilee Files, MD Authorized by: Terrilee Files, MD   Critical care provider statement:    Critical care time (minutes):  45   Critical care time was exclusive of:  Separately billable procedures and treating other patients   Critical care was necessary to treat or prevent imminent or life-threatening deterioration of the following conditions:  Cardiac failure, circulatory failure and shock   Critical care was time spent personally by me on the following activities:  Development of treatment plan with patient or surrogate, discussions with consultants, evaluation of patient's response to treatment, examination of patient, obtaining history from patient or surrogate, ordering and performing treatments and interventions, ordering and review of laboratory studies, ordering and review of radiographic studies, pulse oximetry and re-evaluation of patient's condition   I assumed direction of critical care for this patient from another provider in my specialty: no       Medications Ordered in ED Medications  amiodarone (NEXTERONE) 1.8 mg/mL load via infusion 150 mg (150 mg Intravenous Bolus from Bag 08/06/23 1717)    Followed by  amiodarone (NEXTERONE PREMIX) 360-4.14 MG/200ML-% (1.8 mg/mL) IV infusion (60 mg/hr  Intravenous New Bag/Given 08/06/23 1717)    Followed by  amiodarone (NEXTERONE PREMIX) 360-4.14 MG/200ML-% (1.8 mg/mL) IV infusion (has no administration in time range)  docusate sodium (COLACE) capsule 100 mg (has no administration in time range)  polyethylene glycol (MIRALAX / GLYCOLAX) packet 17 g (has no administration in time range)  calcium gluconate 1 g/ 50 mL sodium chloride IVPB (0 mg Intravenous Stopped 08/06/23 1718)  piperacillin-tazobactam (ZOSYN) IVPB 3.375 g (0 g Intravenous Stopped 08/06/23 1718)  vancomycin (VANCOCIN) IVPB 1000 mg/200 mL premix (0 mg Intravenous Stopped 08/06/23 1718)    ED Course/ Medical Decision Making/ A&P Clinical Course as of 08/06/23 1728  Sat Aug 06, 2023  1437 Discussed with cardiology Dr. Cristal Deer.  She said with his complex nature he is probably will need a medicine admission but cardiology will consult on him. [MB]  1529 Afib RVR, pacemaker in place, lactic acid 7.8, here for diarrhea and weakness, mottled, cards to see, pacemaker rep to come in to adjust [JD]  1602 Patient rep is here and is made some adjustments to his pacemaker.  He is going to reach out to cardiology and let them know the adjustments he is made.  Repeat lactate is rising.  Awaiting critical care input  but have initiated IV antibiotics [MB]  1609 Chest x-ray and reviewed by me as cardiomegaly no definite infiltrate.  Awaiting radiology reading. [MB]  1638 Discussed with critical care for admission. [JD]    Clinical Course User Index [JD] Laurence Spates, MD [MB] Terrilee Files, MD                                 Medical Decision Making Amount and/or Complexity of Data Reviewed Labs: ordered. Radiology: ordered.  Risk Prescription drug management. Decision regarding hospitalization.   This patient complains of increased weakness nausea diarrhea; this involves an extensive number of treatment Options and is a complaint that carries with it a high risk of  complications and morbidity. The differential includes dehydration, metabolic derangement, arrhythmia, congestive heart failure, shock, sepsis  I ordered, reviewed and interpreted labs, which included CBC with normal white count, hemoglobin down from priors, chemistries abnormal with low bicarb elevated potassium elevated LFTs, troponins elevated will need to be trended, BNP elevated, lactate severely elevated, blood culture sent I ordered medication IV antibiotics IV calcium and reviewed PMP when indicated. I ordered imaging studies which included chest x-ray and I independently    visualized and interpreted imaging which showed cardiomegaly possible fluid overload Additional history obtained from patient's wife Previous records obtained and reviewed in epic including recent cardiology notes I consulted cardiology Dr. Cristal Deer, pacer representative, critical care and discussed lab and imaging findings and discussed disposition.  Cardiac monitoring reviewed, atrial fibrillation with wide-complex rhythm Social determinants considered, tobacco use Critical Interventions: Initiation of antibiotics for possible sepsis, consultation with cardiology and pacemaker representative, critical care.  After the interventions stated above, I reevaluated the patient and found patient still to be cool and mottled although denies any chest pain or shortness of breath and blood pressure has been okay along with oxygenation Admission and further testing considered, he will need admission to the hospital for further stabilization and management.  Patient's care signed out to Dr. Earlene Plater to assist with contacting critical care for likely admission         Final Clinical Impression(s) / ED Diagnoses Final diagnoses:  Cardiogenic shock [facility])    Rx / DC Orders ED Discharge Orders     None         Terrilee Files, MD 08/06/23 1734

## 2023-08-06 NOTE — H&P (Signed)
NAME:  [name] [name], MRN:  [id number], DOB:  Jan 29, 1946, LOS: 0 ADMISSION DATE:  08/06/2023, CONSULTATION DATE:  08/06/23 REFERRING MD:  EDP, CHIEF COMPLAINT:  fatigue   History of Present Illness:  77 year old man w/ hx of CAD post CABG, ischemic cardiomyopathy s/p CRT-D, DM, severe MVR, PADs/p aortobifemoral bypass, Afib presenting with increasing fatigue, weakness, anorexia, N/V, abdominal cramping and FTT.  Workup has revealed acute liver injury, lactic acidosis.  Cardiology is consulted and PCCM to admit.  Pertinent  Medical History   Past Medical History:  Diagnosis Date   Acid reflux    AICD (automatic cardioverter/defibrillator) present    a. 08/2016 St. Jude (serial Number [id number]) biventricular ICD.   Anemia    Chronic systolic CHF (congestive heart failure) (HCC)    a. 08/2016 Echo: EF 20%, diffuse HK, inf, post AK, mod-sev MR, sev dil LA, mod TR, PASP .   COPD (chronic obstructive pulmonary disease) (HCC)    Coronary artery disease involving native coronary artery of native heart with angina pectoris (HCC)    a. 10/2012 MI after aortobifem bypass, found to have multivessel CAD -->CABG x3;  b. 08/2016 Cath: LM 60, LAD 62m, LCX 60ost/p, RCA 100p, VG->RPDA 50p, VG->OM1 ok, LIMA->LAD ok, EF 25%.   Essential hypertension 03/03/2012   History of blood transfusion 11/2012   "during his CABG"   History of gout X 1   History of stomach ulcers 1970s   Hyperlipidemia associated with type 2 diabetes mellitus (HCC) 09/16/2011   Ischemic cardiomyopathy    a. 08/2016 Echo: EF 20%, diffuse HK, inf, post AK;  b. 08/2016 St. Jude (serial Number [id number]) biventricular ICD.   Myocardial infarction [facility])    PAD (peripheral artery disease) (HCC)    s/p aortobifemoral bypass 10/2012   Peripheral neuropathy    PTSD (post-traumatic stress disorder)    Type II diabetes mellitus (HCC)    Ventricular tachycardia (HCC)    a. 08/2016 St. Jude (serial Number [id number]) biventricular ICD-->oral  amio added.   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 Is now on chronic suppressive antibiotics with dicloxacillin     Significant Hospital Events: Including procedures, antibiotic start and stop dates in addition to other pertinent events   11/16 admit  Interim History / Subjective:  Consult  Objective   Blood pressure 107/78, pulse (!) 137, temperature 98 F (36.7 C), temperature source Oral, resp. rate (!) 27, height 5\' 6"  (1.676 m), weight 57.6 kg, SpO2 100%.       No intake or output data in the 24 hours ending 08/06/23 1720 Filed Weights   08/06/23 1405  Weight: 57.6 kg    Examination: General: ill appearing man HENT: MMM, +JVD Lungs: clear but diminished, no accessory muscle use Cardiovascular: irregular tachy wide complex afib with bundle block Abdomen: soft, +BS Extremities: mottled, trace edema Neuro: moves to command but weak Psych: RASS 0  Resolved Hospital Problem list     Assessment & Plan:  Cardiogenic shock- with shock liver, encephalopathy, weakness  Afib/RVR on AC PTA- see OP discussions, difficult to treat  Severe MVR- repair was apparently being contemplated  Lactic acidemia- probably from low flow state but does have some TTP, will get CTA A/P to make sure nothing complicating picture  Questionable amio toxicity diagnosed by PFTs and symptoms alone: not sure we can call this true toxicity and benefits outweigh risks  PVD, DM2, COPD, ACID in place, PUD, previous sternal infection requiring reconstruction  - Appreciate cardiology and heart failure input - Place PICC, check CVP, Coox - Amio gtt - CT A/P r/o intestinal ischemia - Likely will need inotropes +/- pressors - Received zosyn in ER, given severity of illness will continue for now but low suspicion of infection - Can start a heparin drip tomorrow, hold for now  given high risk of further decline and potential need for multiple procedures - Spoke with wife, full code for now  [employer] (right click and "Reselect all SmartList Selections" daily)   Diet/type: Regular consistency (see orders) DVT prophylaxis: SCD GI prophylaxis: N/A Lines: N/A Foley:  Yes, and it is still needed Code Status:  full code Last date of multidisciplinary goals of care discussion [08/06/23]  Labs   CBC: Recent Labs  Lab 08/04/23 1630 08/06/23 1419 08/06/23 1456 08/06/23 1551  WBC 7.7 10.1  --   --   NEUTROABS  --  8.3*  --   --   HGB 11.3* 9.8* 11.9* 13.3  HCT 36.1* 32.1* 35.0* 39.0  MCV 76.2* 78.7*  --   --   PLT 307 232  --   --     Basic Metabolic Panel: Recent Labs  Lab 08/04/23 1630 08/06/23 1419 08/06/23 1456 08/06/23 1551  NA 130* 129* 129* 126*  K 5.0 5.3* 4.7 5.2*  CL 96* 96* 101  --   CO2 21* 14*  --   --   GLUCOSE 146* 179* 157*  --   BUN 34* 52* 47*  --   CREATININE 0.99 1.34* 1.10  --   CALCIUM 8.7* 8.0*  --   --   MG  --  2.1  --   --    GFR: Estimated Creatinine Clearance: 47.3 mL/min (by C-G formula based on SCr of 1.1 mg/dL). Recent Labs  Lab 08/04/23 1630 08/06/23 1419 08/06/23 1456 08/06/23 1556  WBC 7.7 10.1  --   --   LATICACIDVEN  --   --  7.8* 8.7*    Liver Function Tests: Recent Labs  Lab 08/06/23 1419  AST 924*  ALT 852*  ALKPHOS 85  BILITOT 0.6  PROT 5.8*  ALBUMIN 3.3*   Recent Labs  Lab 08/06/23 1419  LIPASE 34   No results for input(s): "AMMONIA" in the last 168 hours.  ABG    Component Value Date/Time   PHART 7.400 03/25/2023 1104   PCO2ART 39.2 03/25/2023 1104   PO2ART 69 (L) 03/25/2023 1104   HCO3 16.8 (L) 08/06/2023 1551   TCO2 18 (L) 08/06/2023 1551   ACIDBASEDEF 9.0 (H) 08/06/2023 1551   O2SAT 56 08/06/2023 1551     Coagulation Profile: Recent Labs  Lab 08/06/23 1419  INR 3.1*    Cardiac Enzymes: No results for input(s): "CKTOTAL", "CKMB", "CKMBINDEX", "TROPONINI" in  the last 168 hours.  HbA1C: Hgb A1c MFr Bld  Date/Time Value Ref Range Status  06/13/2023 04:16 PM 9.6 (H) 4.8 - 5.6 % Final    Comment:    (NOTE) Pre diabetes:          5.7%-6.4%  Diabetes:              >6.4%  Glycemic control for   <7.0% adults with diabetes   02/17/2023 02:30 PM 8.0 (H) 4.8 - 5.6 % Final    Comment:    (NOTE)         Prediabetes: 5.7 - 6.4  Diabetes: >6.4         Glycemic control for adults with diabetes: <7.0     CBG: No results for input(s): "GLUCAP" in the last 168 hours.  Review of Systems:    Positive Symptoms in bold:  Constitutional fevers, chills, weight loss, fatigue, anorexia, malaise  Eyes decreased vision, double vision, eye irritation  Ears, Nose, Mouth, Throat sore throat, trouble swallowing, sinus congestion  Cardiovascular chest pain, paroxysmal nocturnal dyspnea, lower ext edema, palpitations   Respiratory SOB, cough, DOE, hemoptysis, wheezing  Gastrointestinal nausea, vomiting, diarrhea  Genitourinary burning with urination, trouble urinating  Musculoskeletal joint aches, joint swelling, back pain  Integumentary  rashes, skin lesions  Neurological focal weakness, focal numbness, trouble speaking, headaches  Psychiatric depression, anxiety, confusion  Endocrine polyuria, polydipsia, cold intolerance, heat intolerance  Hematologic abnormal bruising, abnormal bleeding, unexplained nose bleeds  Allergic/Immunologic recurrent infections, hives, swollen lymph nodes     Past Medical History:  He,  has a past medical history of Acid reflux, AICD (automatic cardioverter/defibrillator) present, Anemia, Chronic systolic CHF (congestive heart failure) (HCC), COPD (chronic obstructive pulmonary disease) (HCC), Coronary artery disease involving native coronary artery of native heart with angina pectoris (HCC), Essential hypertension (03/03/2012), History of blood transfusion (11/2012), History of gout (X 1), History of stomach ulcers  (1970s), Hyperlipidemia associated with type 2 diabetes mellitus (HCC) (09/16/2011), Ischemic cardiomyopathy, Myocardial infarction [facility]), PAD (peripheral artery disease) (HCC), Peripheral neuropathy, PTSD (post-traumatic stress disorder), Type II diabetes mellitus (HCC), Ventricular tachycardia (HCC), and Wound infection after surgery, sequela (2014-2015).   Surgical History:   Past Surgical History:  Procedure Laterality Date   ABDOMINAL AORTOGRAM W/LOWER EXTREMITY Left 05/07/2021   Procedure: ABDOMINAL AORTOGRAM W/LOWER EXTREMITY;  Surgeon: [name], MD;  Location: [facility] INVASIVE CV LAB;  Service: Cardiovascular;  Laterality: Left;   AORTO-FEMORAL BYPASS GRAFT Bilateral 10/2012   [facility]   CARDIAC CATHETERIZATION  04/03/2013   [place] (? for abnormal Nuclear ST) --> no PCI, by report, patent grafts.   CARDIAC CATHETERIZATION N/A 09/02/2016   Procedure: Left Heart Cath and Cors/Grafts Angiography;  Surgeon: Kathleene Hazel, MD;  Location: [facility] INVASIVE CV LAB:: Ost LM 60% -> mid LAD 99% subCTO, ost-prox LCx 60%; prox RCA 100% CTO w/ (small) SVG->RPDA prox 50%; SVG->OM1 ok, LIMA->mLAD ok, EF 25% - complicated by VT during access - Shock x 2, Lidocaine & Amiodarone   CARDIAC CATHETERIZATION  11/09/2012   [facility]: due to Sustained VT post-Ob Ao-BiFem Bypass. (no Angina)   CHOLECYSTECTOMY  03/05/2012   Procedure: LAPAROSCOPIC CHOLECYSTECTOMY WITH INTRAOPERATIVE CHOLANGIOGRAM;  Surgeon: Emelia Loron, MD;  Location: [facility] OR;  Service: General;  Laterality: N/A;   CORONARY ARTERY BYPASS GRAFT  11/2012   [facility]: CABG X 3 -> LIMA-mLAD, SVG-highOM1, SVG-RPDA (dRCA).   ENDARTERECTOMY FEMORAL Left 05/13/2021   Procedure: Left Femoral Endarterectomy WITH BOVINE PATCH PROFUNDOPLASTY, REVISION OF LEFT LIMB OF AORTO-BIFEMORAL GRAFT WITH DACRON GRAFT;  Surgeon: Cephus Shelling, MD;  Location: [facility] OR;  Service: Vascular;  Laterality: Left;   EP IMPLANTABLE DEVICE N/A 09/03/2016    Procedure: BI-VENTRICULAR IMPLANTABLE CARDIOVERTER DEFIBRILLATOR  (CRT-D);  Surgeon: Marinus Maw, MD;  Location[facility] INVASIVE CV LAB;  Service: Cardiovascular;  Laterality: N/A;   INGUINAL HERNIA REPAIR Left 1990s   INSERTION OF ILIAC STENT Left 05/13/2021   Procedure: INSERTION OF SUPERFICIAL FEMORAL ARTERY STENT TIMES THREE AND ANGIOPLASTY;  Surgeon: Cephus Shelling, MD;  Location: [facility] OR;  Service: Vascular;  Laterality: Left;   INTRAOPERATIVE  ARTERIOGRAM Left 05/13/2021   Procedure: INTRA OPERATIVE ARTERIOGRAM OF LEFT LOWER EXTREMITY;  Surgeon: Cephus Shelling, MD;  Location: [facility] OR;  Service: Vascular;  Laterality: Left;   NM MYOVIEW LTD  10/05/2022   EF ~28%.  Severely reduced function.  High risk due to decreased EF.  Large fixed severe defect in the mid to basal inferior and inferolateral wall with associated wall motion abnormality consistent with prior infarction.  No evidence of reversible defect/ischemia. =? Echo EF 40-45% with mid to apical anterior/inferolateral akinesis and inferior akinesis.   RIGHT/LEFT HEART CATH AND CORONARY/GRAFT ANGIOGRAPHY N/A 03/25/2023   Procedure: RIGHT/LEFT HEART CATH AND CORONARY/GRAFT ANGIOGRAPHY;  Surgeon: Marykay Lex, MD;  Location: [facility] INVASIVE CV LAB;  Service: Cardiovascular;  Laterality: N/A;   STERNOTOMY  09/2013   [facility]): Sternal Wound Infection -> re-do sternotomy with metal place "sheild" placed. -Has chronic staph infection, on chronic suppressive medication   TEE WITHOUT CARDIOVERSION N/A 03/25/2023   Procedure: TRANSESOPHAGEAL ECHOCARDIOGRAM;  Surgeon: Christell Constant, MD;  Location: [facility] INVASIVE CV LAB;  Service: Cardiovascular;  Laterality: N/A;   TRANSTHORACIC ECHOCARDIOGRAM  10/05/2022   EF 40 to 45%.  Basal to mid anterolateral and inferolateral akinesis with basal inferior akinesis.  GR 2 DD.  Severely elevated PAP with mild RV dilation.  RV P estimated 67 mmHg).  Severe LA dilation.  Mild to moderate RA dilation.   Mild to moderate TR.  Severe MR likely with restricted posterior leaflet and inferolateral wall motion.  Moderate AOV calcification with no stenosis.  Mild AI.   TRANSTHORACIC ECHOCARDIOGRAM  01/2021   EF 25 to 30%.  Severely depressed EF.  Global HK with worst basal to mid anterolateral and inferolateral as well as anterior basal inferior walls.  GR 1 DD.  Mildly reduced RV function.  Functionally bicuspid aortic valve with moderate calcification but no stenosis.  Overall slightly improved EF.   UMBILICAL HERNIA REPAIR  03/05/2012   Procedure: HERNIA REPAIR UMBILICAL ADULT;  Surgeon: Emelia Loron, MD;  Location: [facility] OR;  Service: General;  Laterality: N/A;     Social History:   reports that he has been smoking pipe. He has never been exposed to tobacco smoke. He has quit using smokeless tobacco.  His smokeless tobacco use included snuff and chew. He reports that he does not drink alcohol and does not use drugs.   Family History:  His family history includes Cancer in his brother.   Allergies Allergies  Allergen Reactions   Lisinopril     Other reaction(s): Cough     Home Medications  Prior to Admission medications   Medication Sig Start Date End Date Taking? Authorizing Provider  acetaminophen (TYLENOL) 650 MG CR tablet Take 650 mg by mouth every 8 (eight) hours as needed for pain.    [provider]  Alpha-Lipoic Acid 600 MG TABS Take 600 mg by mouth daily.    [provider]  apixaban (ELIQUIS) 5 MG TABS tablet Take 1 tablet (5 mg total) by mouth 2 (two) times daily. 02/18/23   Camnitz, Andree Coss, MD  aspirin EC 81 MG tablet Take 81 mg by mouth daily.    [provider]  atorvastatin (LIPITOR) 40 MG tablet Take 40 mg by mouth every evening. 10/29/22   [provider]  Cholecalciferol 50 MCG (2000 UT) TABS Take 2,000 Units by mouth daily. 10/29/22   [provider]  dicloxacillin (DYNAPEN) 500 MG capsule Take 500 mg by mouth 2 (two) times  daily.    [provider]  digoxin (LANOXIN) 0.125 MG tablet Take 1 tablet (0.125 mg total) by mouth daily. Monday through Friday only, None on Saturday and Sunday 06/13/23   Jacklynn Ganong, FNP  diphenhydrAMINE (BENADRYL) 25 mg capsule Take 25 mg by mouth at bedtime as needed for sleep.    [provider]  empagliflozin (JARDIANCE) 25 MG TABS tablet Take 1 tablet (25 mg total) by mouth daily. 07/14/23   Andrey Farmer, PA-C  fexofenadine (ALLEGRA) 180 MG tablet Take 180 mg by mouth daily.    [provider]  fluticasone (FLONASE) 50 MCG/ACT nasal spray Place 2 sprays into both nostrils daily as needed for allergies or rhinitis.    [provider]  furosemide (LASIX) 40 MG tablet Take 1 tablet (40 mg total) by mouth daily. Patient taking differently: Take 80 mg by mouth daily. 07/14/23   Andrey Farmer, PA-C  glipiZIDE (GLUCOTROL) 5 MG tablet Take 5 mg by mouth 2 (two) times daily before a meal.    [provider]  lidocaine (LIDODERM) 5 % Place 1 patch onto the skin daily as needed (For pain). 10/14/22   [provider]  magnesium oxide (MAG-OX) 400 (241.3 Mg) MG tablet Take 1 tablet (400 mg total) by mouth daily. 09/04/16   Creig Hines, NP  metFORMIN (GLUCOPHAGE) 1000 MG tablet Take 1 tablet (1,000 mg total) by mouth 2 (two) times daily with a meal. Resume on 09/06/2016 09/04/16   Creig Hines, NP  metoprolol succinate (TOPROL XL) 25 MG 24 hr tablet Take 0.5 tablets (12.5 mg total) by mouth in the morning and at bedtime. 07/14/23   Andrey Farmer, PA-C  mexiletine (MEXITIL) 150 MG capsule Take 1 capsule (150 mg total) by mouth every 12 (twelve) hours. 02/18/23   Camnitz, Will Daphine Deutscher, MD  miconazole (MICOTIN) 2 % cream Apply 1 Application topically 2 (two) times daily. 07/28/23   [provider]  nitroGLYCERIN (NITROSTAT) 0.4 MG SL tablet Place 1 tablet (0.4 mg total) under the tongue every  5 (five) minutes x 3 doses as needed for chest pain. 09/04/16   Creig Hines, NP  Nutritional Supplements (ENSURE PLUS PO) Take 1-3 Cans by mouth 2 (two) times daily. Additional can, if  he is not eating a day    [provider]  OVER THE COUNTER MEDICATION Take 1 tablet by mouth See admin instructions. Relaxium every other night as needed    [provider]  pantoprazole (PROTONIX) 40 MG tablet Take 1 tablet (40 mg total) by mouth daily. 05/22/21   Marykay Lex, MD  potassium chloride SA (KLOR-CON M20) 20 MEQ tablet Take 1 tablet (20 mEq total) by mouth daily. 08/03/23 11/01/23  Jacklynn Ganong, FNP  sitaGLIPtin (JANUVIA) 50 MG tablet Take 50 mg by mouth daily.    [provider]  spironolactone (ALDACTONE) 25 MG tablet Take 0.5 tablets (12.5 mg total) by mouth daily. 05/02/23   Laurey Morale, MD  Tiotropium Bromide Monohydrate (SPIRIVA RESPIMAT) 2.5 MCG/ACT AERS Inhale 2 puffs into the lungs at bedtime.    [provider]  Vitamins A & D (VITAMIN A & D) ointment Apply 1 Application topically as needed for dry skin (on bottoms).    [provider]     Critical care time: 34 mins

## 2023-08-06 NOTE — ED Triage Notes (Signed)
Pt BIB EMS from home. C/O n/v/d for 2 days. Pt needs heart valve replacement. EMS states they could not get BP or pulse ox and states he would brady to 20's and tachy in 130's. Axox4.

## 2023-08-06 NOTE — Progress Notes (Signed)
eLink Physician-Brief Progress Note Patient Name: [name] [name] DOB: Dec 11, 1945 MRN: [id number]   Date of Service  08/06/2023  HPI/Events of Note  Patient with A-fib in RVR.  He is in cardiogenic shock.  Central venous oxygen saturation is 25.8%.  Patient is already on 0.125 mcg/kg/min of milrinone.  eICU Interventions  Will increase milrinone dose from 0.125 to 0.25 mcg/kg/min.  Bedside nurse informed.  5:25 AM: Addendum: A.m. labs show that lactate is down to 3.7 from 8.7.  Central venous oxygen saturation is up from 25% to 34%.  Will increase milrinone further to 0.375 mcg/kg/min.  Bedside nurse informed.     Intervention Category Major Interventions: Hypotension - evaluation and management  Carilyn Goodpasture 08/06/2023, 10:42 PM

## 2023-08-06 NOTE — Progress Notes (Signed)
Pharmacy Antibiotic Note  [name] [name] is a 77 y.o. male admitted on 08/06/2023 with concern for sepsis.  Pharmacy has been consulted for zosyn dosing.  Plan: Zosyn 3.375g IV q 8 h (extended 4h infusion) Monitor renal function, clinical progression and LOT  Height: 5\' 6"  (167.6 cm) Weight: 57.6 kg (127 lb) IBW/kg (Calculated) : 63.8  Temp (24hrs), Avg:98 F (36.7 C), Min:98 F (36.7 C), Max:98 F (36.7 C)  Recent Labs  Lab 08/04/23 1630 08/06/23 1419 08/06/23 1456 08/06/23 1556  WBC 7.7 10.1  --   --   CREATININE 0.99 1.34* 1.10  --   LATICACIDVEN  --   --  7.8* 8.7*    Estimated Creatinine Clearance: 47.3 mL/min (by C-G formula based on SCr of 1.1 mg/dL).    Allergies  Allergen Reactions   Lisinopril     Other reaction(s): Cough    Daylene Posey, PharmD, [facility] Clinical Pharmacist ED Pharmacist Phone # [phone] 08/06/2023 6:08 PM

## 2023-08-06 NOTE — Procedures (Signed)
Central Venous Catheter Insertion Procedure Note  [name] [name]  [id number]  03-14-1946  Date:08/06/23  Time:9:25 PM   Provider Performing:[name] [name] D [name] [name]   Procedure: Insertion of Non-tunneled Central Venous [phone]) with US guidance ([id number])   Indication(s) Medication administration  Consent Risks of the procedure as well as the alternatives and risks of each were explained to the patient and/or caregiver.  Consent for the procedure was obtained and is signed in the bedside chart  Anesthesia Topical only with 1% lidocaine   Timeout Verified patient identification, verified procedure, site/side was marked, verified correct patient position, special equipment/implants available, medications/allergies/relevant history reviewed, required imaging and test results available.  Sterile Technique Maximal sterile technique including full sterile barrier drape, hand hygiene, sterile gown, sterile gloves, mask, hair covering, sterile ultrasound probe cover (if used).  Procedure Description Area of catheter insertion was cleaned with chlorhexidine and draped in sterile fashion.  With real-time ultrasound guidance a central venous catheter was placed into the right internal jugular vein. Nonpulsatile blood flow and easy flushing noted in all ports.  The catheter was sutured in place and sterile dressing applied.  Complications/Tolerance None; patient tolerated the procedure well. Chest X-ray is ordered to verify placement for internal jugular or subclavian cannulation.   Chest x-ray is not ordered for femoral cannulation.  EBL Minimal  Specimen(s) None  [name] [name] [facility] 08/06/2023, 9:26 PM  Please see [email or web address] for pager details.  From 7A-7P if no response, please call [phone]. After hours, please call ELink [phone].

## 2023-08-06 NOTE — ED Notes (Signed)
RN interrogated pacemaker

## 2023-08-06 NOTE — ED Notes (Signed)
Pts diaper saturated in urine, RN placed condom cath at this time and performed peri care.

## 2023-08-06 NOTE — ED Provider Notes (Signed)
This is a 77 year old male with history of paroxysmal A-fib, CHF, severe mitral insufficiency presenting for nausea, diarrhea, generally not feeling well.  Patient here is mottled with cool extremities.  He is tachycardic in atrial fibrillation.  He has marked lactic acidosis.  Suspect cardiogenic shock has been given antibiotics.  Discussed with cardiology who is coming to evaluate, critical care has been paged as well.  Discussed with Dr. Cristal Deer who evaluated patient.  Plan for admission to the ICU.  I talked with the critical care provider and patient was admitted for further care.   Laurence Spates, MD 08/06/23 [phone]

## 2023-08-06 NOTE — Progress Notes (Signed)
[place] RN notified via secure chat that PICC will be placed Sunday.

## 2023-08-06 NOTE — Consult Note (Signed)
Cardiology Consultation   Patient ID: JAZIAH [name] MRN: [id number]; DOB: [birth year]  Admit date: 08/06/2023 Date of Consult: 08/06/2023  PCP:  Clinic, [facility] Providers Cardiologist:  Bryan Lemma, MD  Electrophysiologist:  Lewayne Bunting, MD       Patient Profile:   [name] [name] is a 77 y.o. male with a hx of CAD s/p CABG, chronic ischemic cardiomyopathy, severe MR, PAD, paroxysmal atrial fibrillation who is being seen 08/06/2023 for the evaluation of atrial fibrillation at the request of Dr. Earlene Plater.  History of Present Illness:   [name] has extensive past cardiac history. Most recently, he was admitted in 06/2023 for low output heart failure in the setting of afib RVR. He underwent emergent cardioversion on 10/10, but based on device interrogation, returned to afib within days of discharge on 10/12. Since that time, he has been seen by Dr. Excell Seltzer in the structural heart clinic for evaluation of mitra clip, and most recently he was seen in the afib clinic 11/14 and planned for cardioversion on 11/19.  Patient's wife provides most of history. He has felt generally weak and short of breath, but a few days ago he began having loose stools. He has not been eating/drinking well since that time, and he also notes that his lasix has not been productive of much urine. His wife tells me that his feet are always cold and a little purple, but they appear worse today than they have recently.   On my interview, he is laying flat on the stretcher without shortness of breath. Denies any chest pain.  Dr. Earlene Plater performed a quick bedside ultrasound evaluation, which I watched. EF appears about 30%. RV does not appear significantly dilated. IVC is dilated but does collapse about 50% with sniff.   Past Medical History:  Diagnosis Date   Acid reflux    AICD (automatic cardioverter/defibrillator) present    a. 08/2016 St. Jude (serial Number [id number])  biventricular ICD.   Anemia    Chronic systolic CHF (congestive heart failure) (HCC)    a. 08/2016 Echo: EF 20%, diffuse HK, inf, post AK, mod-sev MR, sev dil LA, mod TR, PASP .   COPD (chronic obstructive pulmonary disease) (HCC)    Coronary artery disease involving native coronary artery of native heart with angina pectoris (HCC)    a. 10/2012 MI after aortobifem bypass, found to have multivessel CAD -->CABG x3;  b. 08/2016 Cath: LM 60, LAD 55m, LCX 60ost/p, RCA 100p, VG->RPDA 50p, VG->OM1 ok, LIMA->LAD ok, EF 25%.   Essential hypertension 03/03/2012   History of blood transfusion 11/2012   "during his CABG"   History of gout X 1   History of stomach ulcers 1970s   Hyperlipidemia associated with type 2 diabetes mellitus (HCC) 09/16/2011   Ischemic cardiomyopathy    a. 08/2016 Echo: EF 20%, diffuse HK, inf, post AK;  b. 08/2016 St. Jude (serial Number [id number]) biventricular ICD.   Myocardial infarction [facility])    PAD (peripheral artery disease) (HCC)    s/p aortobifemoral bypass 10/2012   Peripheral neuropathy    PTSD (post-traumatic stress disorder)    Type II diabetes mellitus (HCC)    Ventricular tachycardia (HCC)    a. 08/2016 St. Jude (serial Number [id number]) biventricular ICD-->oral amio added.   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 Is now on chronic suppressive antibiotics with dicloxacillin  Past Surgical History:  Procedure Laterality Date   ABDOMINAL AORTOGRAM W/LOWER EXTREMITY Left 05/07/2021   Procedure: ABDOMINAL AORTOGRAM W/LOWER EXTREMITY;  Surgeon: [name], MD;  Location: [facility] INVASIVE CV LAB;  Service: Cardiovascular;  Laterality: Left;   AORTO-FEMORAL BYPASS GRAFT Bilateral 10/2012   [facility]   CARDIAC CATHETERIZATION  04/03/2013   [place] (? for abnormal Nuclear ST) --> no PCI, by report, patent grafts.   CARDIAC  CATHETERIZATION N/A 09/02/2016   Procedure: Left Heart Cath and Cors/Grafts Angiography;  Surgeon: Kathleene Hazel, MD;  Location: [facility] INVASIVE CV LAB:: Ost LM 60% -> mid LAD 99% subCTO, ost-prox LCx 60%; prox RCA 100% CTO w/ (small) SVG->RPDA prox 50%; SVG->OM1 ok, LIMA->mLAD ok, EF 25% - complicated by VT during access - Shock x 2, Lidocaine & Amiodarone   CARDIAC CATHETERIZATION  11/09/2012   [facility]: due to Sustained VT post-Ob Ao-BiFem Bypass. (no Angina)   CHOLECYSTECTOMY  03/05/2012   Procedure: LAPAROSCOPIC CHOLECYSTECTOMY WITH INTRAOPERATIVE CHOLANGIOGRAM;  Surgeon: Emelia Loron, MD;  Location: [facility] OR;  Service: General;  Laterality: N/A;   CORONARY ARTERY BYPASS GRAFT  11/2012   [facility]: CABG X 3 -> LIMA-mLAD, SVG-highOM1, SVG-RPDA (dRCA).   ENDARTERECTOMY FEMORAL Left 05/13/2021   Procedure: Left Femoral Endarterectomy WITH BOVINE PATCH PROFUNDOPLASTY, REVISION OF LEFT LIMB OF AORTO-BIFEMORAL GRAFT WITH DACRON GRAFT;  Surgeon: Cephus Shelling, MD;  Location: [facility] OR;  Service: Vascular;  Laterality: Left;   EP IMPLANTABLE DEVICE N/A 09/03/2016   Procedure: BI-VENTRICULAR IMPLANTABLE CARDIOVERTER DEFIBRILLATOR  (CRT-D);  Surgeon: Marinus Maw, MD;  Location: [facility] INVASIVE CV LAB;  Service: Cardiovascular;  Laterality: N/A;   INGUINAL HERNIA REPAIR Left 1990s   INSERTION OF ILIAC STENT Left 05/13/2021   Procedure: INSERTION OF SUPERFICIAL FEMORAL ARTERY STENT TIMES THREE AND ANGIOPLASTY;  Surgeon: Cephus Shelling, MD;  Location[facility] OR;  Service: Vascular;  Laterality: Left;   INTRAOPERATIVE ARTERIOGRAM Left 05/13/2021   Procedure: INTRA OPERATIVE ARTERIOGRAM OF LEFT LOWER EXTREMITY;  Surgeon: Cephus Shelling, MD;  Location: [facility] OR;  Service: Vascular;  Laterality: Left;   NM MYOVIEW LTD  10/05/2022   EF ~28%.  Severely reduced function.  High risk due to decreased EF.  Large fixed severe defect in the mid to basal inferior and inferolateral wall with associated  wall motion abnormality consistent with prior infarction.  No evidence of reversible defect/ischemia. =? Echo EF 40-45% with mid to apical anterior/inferolateral akinesis and inferior akinesis.   RIGHT/LEFT HEART CATH AND CORONARY/GRAFT ANGIOGRAPHY N/A 03/25/2023   Procedure: RIGHT/LEFT HEART CATH AND CORONARY/GRAFT ANGIOGRAPHY;  Surgeon: Marykay Lex, MD;  Location: [facility] INVASIVE CV LAB;  Service: Cardiovascular;  Laterality: N/A;   STERNOTOMY  09/2013   [facility]): Sternal Wound Infection -> re-do sternotomy with metal place "sheild" placed. -Has chronic staph infection, on chronic suppressive medication   TEE WITHOUT CARDIOVERSION N/A 03/25/2023   Procedure: TRANSESOPHAGEAL ECHOCARDIOGRAM;  Surgeon: Christell Constant, MD;  Location: [facility] INV[facility] LAB;  Service: Cardiovascular;  Laterality: N/A;   TRANSTHORACIC ECHOCARDIOGRAM  10/05/2022   EF 40 to 45%.  Basal to mid anterolateral and inferolateral akinesis with basal inferior akinesis.  GR 2 DD.  Severely elevated PAP with mild RV dilation.  RV P estimated 67 mmHg).  Severe LA dilation.  Mild to moderate RA dilation.  Mild to moderate TR.  Severe MR likely with restricted posterior leaflet and inferolateral wall motion.  Moderate AOV calcification with no stenosis.  Mild AI.  TRANSTHORACIC ECHOCARDIOGRAM  01/2021   EF 25 to 30%.  Severely depressed EF.  Global HK with worst basal to mid anterolateral and inferolateral as well as anterior basal inferior walls.  GR 1 DD.  Mildly reduced RV function.  Functionally bicuspid aortic valve with moderate calcification but no stenosis.  Overall slightly improved EF.   UMBILICAL HERNIA REPAIR  03/05/2012   Procedure: HERNIA REPAIR UMBILICAL ADULT;  Surgeon: Emelia Loron, MD;  Location: [facility] OR;  Service: General;  Laterality: N/A;     Home Medications:  Prior to Admission medications   Medication Sig Start Date End Date Taking? Authorizing Provider  acetaminophen (TYLENOL) 650 MG CR tablet Take  650 mg by mouth every 8 (eight) hours as needed for pain.   Yes [provider]  Alpha-Lipoic Acid 600 MG TABS Take 600 mg by mouth daily.   Yes [provider]  apixaban (ELIQUIS) 5 MG TABS tablet Take 1 tablet (5 mg total) by mouth 2 (two) times daily. 02/18/23  Yes Camnitz, Andree Coss, MD  aspirin EC 81 MG tablet Take 81 mg by mouth daily.   Yes [provider]  atorvastatin (LIPITOR) 40 MG tablet Take 40 mg by mouth every evening. 10/29/22  Yes [provider]  Cholecalciferol 50 MCG (2000 UT) TABS Take 2,000 Units by mouth daily. 10/29/22  Yes [provider]  dicloxacillin (DYNAPEN) 500 MG capsule Take 500 mg by mouth 2 (two) times daily.   Yes [provider]  digoxin (LANOXIN) 0.125 MG tablet Take 1 tablet (0.125 mg total) by mouth daily. Monday through Friday only, None on Saturday and Sunday 06/13/23  Yes Milford, Anderson Malta, FNP  empagliflozin (JARDIANCE) 25 MG TABS tablet Take 1 tablet (25 mg total) by mouth daily. 07/14/23  Yes Andrey Farmer, PA-C  fexofenadine (ALLEGRA) 180 MG tablet Take 180 mg by mouth daily.   Yes [provider]  furosemide (LASIX) 80 MG tablet Take 80 mg by mouth daily.   Yes [provider]  glipiZIDE (GLUCOTROL) 5 MG tablet Take 5 mg by mouth 2 (two) times daily before a meal.   Yes [provider]  lidocaine (LIDODERM) 5 % Place 1 patch onto the skin daily as needed (For pain). 10/14/22  Yes [provider]  magnesium oxide (MAG-OX) 400 (241.3 Mg) MG tablet Take 1 tablet (400 mg total) by mouth daily. 09/04/16  Yes Creig Hines, NP  metFORMIN (GLUCOPHAGE) 1000 MG tablet Take 1 tablet (1,000 mg total) by mouth 2 (two) times daily with a meal. Resume on 09/06/2016 09/04/16  Yes Creig Hines, NP  metoprolol succinate (TOPROL XL) 25 MG 24 hr tablet Take 0.5 tablets (12.5 mg total) by mouth in the morning and at bedtime. 07/14/23  Yes Andrey Farmer, PA-C  mexiletine (MEXITIL) 150 MG capsule Take 1 capsule (150 mg total) by mouth every 12 (twelve) hours. 02/18/23  Yes Camnitz, Will Daphine Deutscher, MD  miconazole (MICOTIN) 2 % cream Apply 1 Application topically 2 (two) times daily. 07/28/23  Yes [provider]  nitroGLYCERIN (NITROSTAT) 0.4 MG SL tablet Place 1 tablet (0.4 mg total) under the tongue every 5 (five) minutes x 3 doses as needed for chest pain. 09/04/16  Yes Creig Hines, NP  Nutritional Supplements (ENSURE PLUS PO) Take 1-3 Cans by mouth 2 (two) times daily. Additional can, if  he is not eating a day   Yes [provider]  pantoprazole (PROTONIX) 40 MG tablet Take 1 tablet (  40 mg total) by mouth daily. 05/22/21  Yes Marykay Lex, MD  potassium chloride SA (KLOR-CON M20) 20 MEQ tablet Take 1 tablet (20 mEq total) by mouth daily. 08/03/23 11/01/23 Yes Milford, Anderson Malta, FNP  sitaGLIPtin (JANUVIA) 50 MG tablet Take 50 mg by mouth daily.   Yes [provider]  spironolactone (ALDACTONE) 25 MG tablet Take 0.5 tablets (12.5 mg total) by mouth daily. 05/02/23  Yes Laurey Morale, MD  Tiotropium Bromide Monohydrate (SPIRIVA RESPIMAT) 2.5 MCG/ACT AERS Inhale 2 puffs into the lungs at bedtime.   Yes [provider]  Vitamins A & D (VITAMIN A & D) ointment Apply 1 Application topically as needed for dry skin (on bottoms).   Yes [provider]  diphenhydrAMINE (BENADRYL) 25 mg capsule Take 25 mg by mouth at bedtime as needed for sleep. Patient not taking: Reported on 08/06/2023    [provider]    Inpatient Medications: Scheduled Meds:  insulin aspart  0-9 Units Subcutaneous Q4H   sodium chloride flush  10 mL Intravenous Q12H   Continuous Infusions:  amiodarone 60 mg/hr (08/06/23 1717)   Followed by   amiodarone     piperacillin-tazobactam (ZOSYN)  IV     PRN Meds: docusate sodium, polyethylene glycol  Allergies:    Allergies  Allergen Reactions   Lisinopril      Other reaction(s): Cough    Social History:   Social History   Socioeconomic History   Marital status: Married    Spouse name: Not on file   Number of children: Not on file   Years of education: Not on file   Highest education level: Not on file  Occupational History   Not on file  Tobacco Use   Smoking status: Every Day    Types: Pipe    Passive exposure: Never   Smokeless tobacco: Former    Types: Snuff, Chew   Tobacco comments:    09/03/2016 "used smokeless tobacco in my teens; quit smoking cigarettes in the early 1980s; smokes pipe only"  Vaping Use   Vaping status: Never Used  Substance and Sexual Activity   Alcohol use: No   Drug use: No   Sexual activity: Not on file  Other Topics Concern   Not on file  Social History Narrative   Routine activity around the house, but is limited more by musculoskeletal leg pain and weakness.      He enjoys smoking a pipe-long ago, he quit cigarettes   Social Determinants of Health   Financial Resource Strain: Not on file  Food Insecurity: No Food Insecurity (06/30/2023)   Hunger Vital Sign    Worried About Running Out of Food in the Last Year: Never true    Ran Out of Food in the Last Year: Never true  Transportation Needs: No Transportation Needs (06/30/2023)   PRAPARE - Administrator, Civil Service (Medical): No    Lack of Transportation (Non-Medical): No  Physical Activity: Not on file  Stress: Not on file  Social Connections: Not on file  Intimate Partner Violence: Not At Risk (06/30/2023)   Humiliation, Afraid, Rape, and Kick questionnaire    Fear of Current or Ex-Partner: No    Emotionally Abused: No    Physically Abused: No    Sexually Abused: No    Family History:    Family History  Problem Relation Age of Onset   Cancer Brother        esophageal     ROS:  Please see the history of present illness.     Physical Exam/Data:   Vitals:   08/06/23 1424 08/06/23 1455 08/06/23 1545  08/06/23 1645  BP:  100/79 109/78 107/78  Pulse:  77 (!) 104 (!) 137  Resp:  15 20 (!) 27  Temp: 98 F (36.7 C)     TempSrc: Oral     SpO2:  98% 97% 100%  Weight:      Height:       No intake or output data in the 24 hours ending 08/06/23 1819    08/06/2023    2:05 PM 08/04/2023    3:02 PM 07/29/2023    2:16 PM  Last 3 Weights  Weight (lbs) 127 lb 127 lb 122 lb 3.2 oz  Weight (kg) 57.607 kg 57.607 kg 55.43 kg     Body mass index is 20.5 kg/m.  GEN: frail, chronically ill appearing man sleeping flat on the stretcher HEENT: Normal, moist mucous membranes NECK:  elevated JVD CARDIAC: tachycardic, irregularly irregular, with 3/6 systolic murmur VASCULAR: Radial pulses 2+ bilaterally. DP pulses faint to palpation but present on doppler RESPIRATORY:  largely clear, no rales appreciated ABDOMEN: Soft, non-tender, non-distended MUSCULOSKELETAL:  moves all 4 limbs independently SKIN: cool bilateral lower extremities with purple discoloration and mottling. Mild pedal edema  NEUROLOGIC:  Alert and oriented x 3. No focal neuro deficits noted. PSYCHIATRIC:  Normal affect    EKG:  The EKG was personally reviewed and demonstrates:  afib rvr Telemetry:  Telemetry was personally reviewed and demonstrates:  afib rvr  Relevant CV Studies: Echo 07/29/23 1. Mid/inferior basal akinesis . Left ventricular ejection fraction, by  estimation, is 35 to 40%. The left ventricle has moderately decreased  function. The left ventricle demonstrates regional wall motion  abnormalities (see scoring diagram/findings for  description). The left ventricular internal cavity size was moderately  dilated. Left ventricular diastolic parameters are indeterminate.   2. Device wires in RA/RV. Right ventricular systolic function is  moderately reduced. The right ventricular size is mildly enlarged.   3. Left atrial size was severely dilated.   4. Right atrial size was mildly dilated.   5. Apically displaced  coaptation. Restricted posterior leaflet motion in  association with inferior RWMA suggests ischemic MR . The mitral valve is  abnormal. Severe mitral valve regurgitation.   6. Tricuspid valve regurgitation is moderate.   7. Calcified right and left cusps with restricted motion . The aortic  valve is calcified. There is moderate calcification of the aortic valve.  There is moderate thickening of the aortic valve. Aortic valve  regurgitation is mild. Aortic valve sclerosis  is present, with no evidence of aortic valve stenosis.   Laboratory Data:  High Sensitivity Troponin:   Recent Labs  Lab 08/06/23 1419 08/06/23 1620  TROPONINIHS 94* 87*     Chemistry Recent Labs  Lab 08/04/23 1630 08/06/23 1419 08/06/23 1456 08/06/23 1551  NA 130* 129* 129* 126*  K 5.0 5.3* 4.7 5.2*  CL 96* 96* 101  --   CO2 21* 14*  --   --   GLUCOSE 146* 179* 157*  --   BUN 34* 52* 47*  --   CREATININE 0.99 1.34* 1.10  --   CALCIUM 8.7* 8.0*  --   --   MG  --  2.1  --   --   GFRNONAA >60 55*  --   --   ANIONGAP 13 19*  --   --     Recent  Labs  Lab 08/06/23 1419  PROT 5.8*  ALBUMIN 3.3*  AST 924*  ALT 852*  ALKPHOS 85  BILITOT 0.6   Lipids No results for input(s): "CHOL", "TRIG", "HDL", "LABVLDL", "LDLCALC", "CHOLHDL" in the last 168 hours.  Hematology Recent Labs  Lab 08/04/23 1630 08/06/23 1419 08/06/23 1456 08/06/23 1551  WBC 7.7 10.1  --   --   RBC 4.74 4.08*  --   --   HGB 11.3* 9.8* 11.9* 13.3  HCT 36.1* 32.1* 35.0* 39.0  MCV 76.2* 78.7*  --   --   MCH 23.8* 24.0*  --   --   MCHC 31.3 30.5  --   --   RDW 17.3* 17.5*  --   --   PLT 307 232  --   --    Thyroid No results for input(s): "TSH", "FREET4" in the last 168 hours.  BNP Recent Labs  Lab 08/06/23 1419  BNP 2,725.8*    DDimer No results for input(s): "DDIMER" in the last 168 hours.   Radiology/Studies:  DG Chest Port 1 View  Result Date: 08/06/2023 CLINICAL DATA:  Increased weakness EXAM: PORTABLE CHEST 1  VIEW COMPARISON:  Chest radiograph dated 06/30/2023 FINDINGS: Lines/tubes: Left chest wall ICD leads project over the right atrium and ventricle and tributary of the coronary sinus. Lungs: Well inflated lungs. No focal consolidation. Mild interstitial opacities. Pleura: No pneumothorax or pleural effusion. Heart/mediastinum: Similar mildly enlarged cardiomediastinal silhouette. Bones: Postsurgical changes of the sternum. Similar fractured inferior sternotomy wire. IMPRESSION: 1. Mild interstitial opacities, which may represent pulmonary edema. 2. Similar mild cardiomegaly. Electronically Signed   By: Agustin Cree M.D.   On: 08/06/2023 16:52   [place] EKG SITE RITE  Result Date: 08/06/2023 If Site Rite image not attached, placement could not be confirmed due to current cardiac rhythm.    Assessment and Plan:   Atrial fibrillation with RVR Acute on chronic systolic and diastolic heart failure Elevated lactate, elevated transaminases, cool extremities, concerning for cardiogenic shock Hyponatremia, worse than his chronic level -Discussed with device rep. Has been in afib RVR up to 160s for about 2 mos. Device reprogrammed to minimize mode switches and to not start VT treatment until 190 bpm, BiV pacing has been decreased while in afib RVR due to fast rates. Ideally slowing rates will help with biv pacing -discussed options with his wife. His long term amiodarone was stopped in the past due to concern for lung toxicity. Given that he did not remain in SR for long after cardioversion, I do not think emergency cardioversion alone is likely to hold. With concern for shock, would not use b blocker. No ca channel block with chronically low ef and especially not with shock. On digoxin as an outpatient, has not slowed his rhythm. Will use IV amiodarone. Suspect LFTs elevated due to shock, will monitor with start of amio. Discussed risk/benefit with patient's wife, and at the current time benefit outweighs risk -per  wife, patient has not missed any doses of anticoagulant -my goal would be to slow him down with the amiodarone, get a central line for CVP and coox, and then unclamp him with milrinone. Discussed with Dr. Katrinka Blazing, he is of the same opinion  Severe MR -planned for mitraclip in December. Suspect severe MR is also making afib persistent, and rapid afib is preventing ideal filling times and worsening forward flow  CAD s/p CABG Elevated troponins -no chest pain, elevated troponins consistent with demand given his low output state  He  is high risk for decompensation. Appreciate ICU care. Advanced HF has also been contacted given concern for shock. Multiple organ systems already affected, needs high level care.  Risk Assessment/Risk Scores:        New York Heart Association (NYHA) Functional Class NYHA Class IV  CHA2DS2-VASc Score = 6   This indicates a 9.7% annual risk of stroke. The patient's score is based upon: CHF History: 1 HTN History: 1 Diabetes History: 1 Stroke History: 0 Vascular Disease History: 1 Age Score: 2 Gender Score: 0      For questions or updates, please contact [facility] Please consult [email or web address] for contact info under    Signed, Jodelle Red, MD  08/06/2023 6:19 PM

## 2023-08-07 ENCOUNTER — Inpatient Hospital Stay (HOSPITAL_COMMUNITY): Payer: Medicare Other

## 2023-08-07 DIAGNOSIS — Z7189 Other specified counseling: Secondary | ICD-10-CM | POA: Diagnosis not present

## 2023-08-07 DIAGNOSIS — R7401 Elevation of levels of liver transaminase levels: Secondary | ICD-10-CM | POA: Diagnosis not present

## 2023-08-07 DIAGNOSIS — I501 Left ventricular failure: Secondary | ICD-10-CM

## 2023-08-07 DIAGNOSIS — R5381 Other malaise: Secondary | ICD-10-CM | POA: Diagnosis not present

## 2023-08-07 DIAGNOSIS — Z515 Encounter for palliative care: Secondary | ICD-10-CM

## 2023-08-07 DIAGNOSIS — E871 Hypo-osmolality and hyponatremia: Secondary | ICD-10-CM | POA: Diagnosis not present

## 2023-08-07 DIAGNOSIS — I340 Nonrheumatic mitral (valve) insufficiency: Secondary | ICD-10-CM | POA: Diagnosis not present

## 2023-08-07 DIAGNOSIS — I4891 Unspecified atrial fibrillation: Secondary | ICD-10-CM | POA: Diagnosis not present

## 2023-08-07 DIAGNOSIS — R570 Cardiogenic shock: Secondary | ICD-10-CM | POA: Diagnosis not present

## 2023-08-07 DIAGNOSIS — E872 Acidosis, unspecified: Secondary | ICD-10-CM | POA: Diagnosis not present

## 2023-08-07 LAB — COOXEMETRY PANEL
Carboxyhemoglobin: 1.2 % (ref 0.5–1.5)
Carboxyhemoglobin: 1.4 % (ref 0.5–1.5)
Methemoglobin: 1 % (ref 0.0–1.5)
Methemoglobin: <0.7 % (ref 0.0–1.5)
O2 Saturation: 34.4 %
O2 Saturation: 48 %
Total hemoglobin: 10.2 g/dL — ABNORMAL LOW (ref 12.0–16.0)
Total hemoglobin: 9.9 g/dL — ABNORMAL LOW (ref 12.0–16.0)

## 2023-08-07 LAB — GLUCOSE, CAPILLARY
Glucose-Capillary: 100 mg/dL — ABNORMAL HIGH (ref 70–99)
Glucose-Capillary: 135 mg/dL — ABNORMAL HIGH (ref 70–99)
Glucose-Capillary: 178 mg/dL — ABNORMAL HIGH (ref 70–99)
Glucose-Capillary: 178 mg/dL — ABNORMAL HIGH (ref 70–99)
Glucose-Capillary: 199 mg/dL — ABNORMAL HIGH (ref 70–99)
Glucose-Capillary: 244 mg/dL — ABNORMAL HIGH (ref 70–99)
Glucose-Capillary: 66 mg/dL — ABNORMAL LOW (ref 70–99)

## 2023-08-07 LAB — BASIC METABOLIC PANEL
Anion gap: 13 (ref 5–15)
BUN: 56 mg/dL — ABNORMAL HIGH (ref 8–23)
CO2: 21 mmol/L — ABNORMAL LOW (ref 22–32)
Calcium: 8.4 mg/dL — ABNORMAL LOW (ref 8.9–10.3)
Chloride: 90 mmol/L — ABNORMAL LOW (ref 98–111)
Creatinine, Ser: 1.45 mg/dL — ABNORMAL HIGH (ref 0.61–1.24)
GFR, Estimated: 50 mL/min — ABNORMAL LOW (ref >60)
Glucose, Bld: 89 mg/dL (ref 70–99)
Potassium: 4.6 mmol/L (ref 3.5–5.1)
Sodium: 124 mmol/L — ABNORMAL LOW (ref 135–145)

## 2023-08-07 LAB — HEPATIC FUNCTION PANEL
ALT: 1541 U/L — ABNORMAL HIGH (ref 0–44)
AST: 1829 U/L — ABNORMAL HIGH (ref 15–41)
Albumin: 3.1 g/dL — ABNORMAL LOW (ref 3.5–5.0)
Alkaline Phosphatase: 86 U/L (ref 38–126)
Bilirubin, Direct: 0.2 mg/dL (ref 0.0–0.2)
Indirect Bilirubin: 0.4 mg/dL (ref 0.3–0.9)
Total Bilirubin: 0.6 mg/dL (ref <1.2)
Total Protein: 5.6 g/dL — ABNORMAL LOW (ref 6.5–8.1)

## 2023-08-07 LAB — CBC
HCT: 31 % — ABNORMAL LOW (ref 39.0–52.0)
Hemoglobin: 9.7 g/dL — ABNORMAL LOW (ref 13.0–17.0)
MCH: 23.7 pg — ABNORMAL LOW (ref 26.0–34.0)
MCHC: 31.3 g/dL (ref 30.0–36.0)
MCV: 75.8 fL — ABNORMAL LOW (ref 80.0–100.0)
Platelets: 222 10*3/uL (ref 150–400)
RBC: 4.09 MIL/uL — ABNORMAL LOW (ref 4.22–5.81)
RDW: 17.2 % — ABNORMAL HIGH (ref 11.5–15.5)
WBC: 13 10*3/uL — ABNORMAL HIGH (ref 4.0–10.5)
nRBC: 0.8 % — ABNORMAL HIGH (ref 0.0–0.2)

## 2023-08-07 LAB — ECHOCARDIOGRAM LIMITED
Height: 66 in
S' Lateral: 5.8 cm
Weight: 2070.56 [oz_av]

## 2023-08-07 LAB — LACTIC ACID, PLASMA: Lactic Acid, Venous: 3.7 mmol/L (ref 0.5–1.9)

## 2023-08-07 LAB — MAGNESIUM: Magnesium: 2.3 mg/dL (ref 1.7–2.4)

## 2023-08-07 LAB — IRON AND TIBC
Iron: 7 ug/dL — ABNORMAL LOW (ref 45–182)
Saturation Ratios: 2 % — ABNORMAL LOW (ref 17.9–39.5)
TIBC: 384 ug/dL (ref 250–450)
UIBC: 377 ug/dL

## 2023-08-07 LAB — PHOSPHORUS: Phosphorus: 5.1 mg/dL — ABNORMAL HIGH (ref 2.5–4.6)

## 2023-08-07 LAB — FERRITIN: Ferritin: 75 ng/mL (ref 24–336)

## 2023-08-07 MED ORDER — HEPARIN (PORCINE) 25000 UT/250ML-% IV SOLN: 800.0000 [IU]/h | INTRAVENOUS | Status: DC

## 2023-08-07 MED ORDER — SODIUM CHLORIDE 0.9% FLUSH
10.0000 mL | Freq: Two times a day (BID) | INTRAVENOUS | Status: DC
  Administered 2023-08-07 – 2023-08-12 (×8): 10 mL

## 2023-08-07 MED ORDER — THIAMINE HCL 100 MG/ML IJ SOLN
100.0000 mg | INTRAMUSCULAR | Status: DC
  Administered 2023-08-11 – 2023-08-13 (×3): 100 mg via INTRAVENOUS
  Filled 2023-08-07 (×3): qty 2

## 2023-08-07 MED ORDER — SODIUM CHLORIDE 0.9 % IV SOLN
250.0000 mg | INTRAVENOUS | Status: AC
  Administered 2023-08-08 – 2023-08-10 (×3): 250 mg via INTRAVENOUS
  Filled 2023-08-07 (×3): qty 2.5

## 2023-08-07 MED ORDER — FUROSEMIDE 10 MG/ML IJ SOLN
80.0000 mg | Freq: Once | INTRAMUSCULAR | Status: AC
  Administered 2023-08-07: 80 mg via INTRAVENOUS
  Filled 2023-08-07: qty 8

## 2023-08-07 MED ORDER — CHLORHEXIDINE GLUCONATE CLOTH 2 % EX PADS
6.0000 | MEDICATED_PAD | Freq: Every day | CUTANEOUS | Status: DC
  Administered 2023-08-07 – 2023-08-15 (×9): 6 via TOPICAL

## 2023-08-07 MED ORDER — SODIUM CHLORIDE 0.9% FLUSH
10.0000 mL | Freq: Two times a day (BID) | INTRAVENOUS | Status: DC
  Administered 2023-08-07 – 2023-08-15 (×15): 10 mL

## 2023-08-07 MED ORDER — APIXABAN 5 MG PO TABS
5.0000 mg | ORAL_TABLET | Freq: Two times a day (BID) | ORAL | Status: DC
  Administered 2023-08-07 – 2023-08-15 (×17): 5 mg via ORAL
  Filled 2023-08-07 (×17): qty 1

## 2023-08-07 MED ORDER — NOREPINEPHRINE 4 MG/250ML-% IV SOLN
0.0000 ug/min | INTRAVENOUS | Status: DC
  Administered 2023-08-07: 2 ug/min via INTRAVENOUS
  Administered 2023-08-08: 5 ug/min via INTRAVENOUS
  Administered 2023-08-09 – 2023-08-11 (×6): 6 ug/min via INTRAVENOUS
  Administered 2023-08-12 (×2): 5 ug/min via INTRAVENOUS
  Filled 2023-08-07 (×9): qty 250

## 2023-08-07 MED ORDER — THIAMINE HCL 100 MG/ML IJ SOLN
500.0000 mg | Freq: Once | INTRAVENOUS | Status: AC
  Administered 2023-08-07: 500 mg via INTRAVENOUS
  Filled 2023-08-07: qty 5

## 2023-08-07 MED ORDER — SODIUM CHLORIDE 0.9% FLUSH
10.0000 mL | INTRAVENOUS | Status: DC | PRN
  Administered 2023-08-10: 10 mL

## 2023-08-07 NOTE — Consult Note (Signed)
Advanced Heart Failure Team Consult Note   Primary Physician: [facility] PCP-Cardiologist:  Bryan Lemma, MD AHF: Shirlee Latch   Reason for Consultation: Cardiogenic shock   HPI:    [name] [name] is seen today for evaluation of cardiogenic shock at the request of Dr. Cristal Deer .   77 y.o. with history of CAD s/p CABG, ischemic cardiomyopathy, PAD s/p aortobifemoral bypass, PAF, and severe mitral regurgitation was referred by Dr. Excell Seltzer for CHF evaluation prior to mTEER.  Patient had MI in 2/14 with cath showing severe 3VD.  CABG in 2/14 with LIMA-LAD, SVG-PDA, SVG-OM1. Subsequently developed ischemic cardiomyopathy.  He had St Jude CRT-D device placed.  Echo in 1/24 showed EF 40-45%, basal-mid AL and IL AK, basal inferior AK, mild LVH, moderate RV dysfunction with PASP 67 mmHG, severe LAE, severe infarct-related MR, mild-moderate TR.  He also has history of aortobifemoral bypass in 2014.  He had left SFA stent in 2022 for tissue loss in the left great toe. 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  Had breakthrough atrial fibrillation on sotalol with inappropriate ICD shock and is now off sotalol. He has been on mexiletine for frequent PVCs.    Patient's mitral regurgitation has been evaluated recently by Dr. Excell Seltzer for possible mTEER.  Cath in 7/24 showed elevated filling pressures and low cardiac output.  Bypass grafts were patent. TEE in 7/24 showed EF 30-35%, severe MR with restricted posterior leaflet and small flail segment in posterior leaflet, RV normal, mild AI.     Most recently, he was admitted in 06/2023 for low output heart failure/shock with elevated lactic acid in the setting of afib RVR. He underwent emergent cardioversion on 10/10, but based on device interrogation, returned to afib within days of discharge on 10/12. Since that time, he has been seen by Dr. Excell Seltzer in the structural heart clinic for evaluation of mitra  clip, and most recently he was seen in the afib clinic 11/14 and planned for cardioversion on 11/19.  Echo 07/29/23 EF 35-40% RV moderately reduced severe MR  At baseline he is very frail and gets around house with motorized scooter and walker. Has had LE wounds Over past few days has had increasing weakness. Wife has urged him to come to the hospital but he was refusing.   He finally came to ED yesterday and was in profound shock Co-ox 26% lactic acid 7.8 -> 8.7 AST 1,829 ALT 1541 Scr 1.1 -> 14   Admitted to ICU and started on milrinone and NE. IV amio started for AF. NE weaned off as BP stable. LA now 3.7   Co-ox now 48% on milrinone 0.375  Says he fees weak and tired No CP or SOB      Home Medications Prior to Admission medications   Medication Sig Start Date End Date Taking? Authorizing Provider  acetaminophen (TYLENOL) 650 MG CR tablet Take 650 mg by mouth every 8 (eight) hours as needed for pain.   Yes [provider]  Alpha-Lipoic Acid 600 MG TABS Take 600 mg by mouth daily.   Yes [provider]  apixaban (ELIQUIS) 5 MG TABS tablet Take 1 tablet (5 mg total) by mouth 2 (two) times daily. 02/18/23  Yes Camnitz, Andree Coss, MD  aspirin EC 81 MG tablet Take 81 mg by mouth daily.   Yes [provider]  atorvastatin (LIPITOR) 40 MG tablet Take 40 mg by mouth every evening. 10/29/22  Yes [provider]  Cholecalciferol 50 MCG (2000 UT) TABS Take 2,000 Units by mouth daily. 10/29/22  Yes [provider]  dicloxacillin (DYNAPEN) 500 MG capsule Take 500 mg by mouth 2 (two) times daily.   Yes [provider]  digoxin (LANOXIN) 0.125 MG tablet Take 1 tablet (0.125 mg total) by mouth daily. Monday through Friday only, None on Saturday and Sunday 06/13/23  Yes Milford, Anderson Malta, FNP  empagliflozin (JARDIANCE) 25 MG TABS tablet Take 1 tablet (25 mg total) by mouth daily. 07/14/23  Yes Andrey Farmer, PA-C  fexofenadine (ALLEGRA) 180 MG  tablet Take 180 mg by mouth daily.   Yes [provider]  furosemide (LASIX) 80 MG tablet Take 80 mg by mouth daily.   Yes [provider]  glipiZIDE (GLUCOTROL) 5 MG tablet Take 5 mg by mouth 2 (two) times daily before a meal.   Yes [provider]  lidocaine (LIDODERM) 5 % Place 1 patch onto the skin daily as needed (For pain). 10/14/22  Yes [provider]  magnesium oxide (MAG-OX) 400 (241.3 Mg) MG tablet Take 1 tablet (400 mg total) by mouth daily. 09/04/16  Yes Creig Hines, NP  metFORMIN (GLUCOPHAGE) 1000 MG tablet Take 1 tablet (1,000 mg total) by mouth 2 (two) times daily with a meal. Resume on 09/06/2016 09/04/16  Yes Creig Hines, NP  metoprolol succinate (TOPROL XL) 25 MG 24 hr tablet Take 0.5 tablets (12.5 mg total) by mouth in the morning and at bedtime. 07/14/23  Yes Andrey Farmer, PA-C  mexiletine (MEXITIL) 150 MG capsule Take 1 capsule (150 mg total) by mouth every 12 (twelve) hours. 02/18/23  Yes Camnitz, Will Daphine Deutscher, MD  miconazole (MICOTIN) 2 % cream Apply 1 Application topically 2 (two) times daily. 07/28/23  Yes [provider]  nitroGLYCERIN (NITROSTAT) 0.4 MG SL tablet Place 1 tablet (0.4 mg total) under the tongue every 5 (five) minutes x 3 doses as needed for chest pain. 09/04/16  Yes Creig Hines, NP  Nutritional Supplements (ENSURE PLUS PO) Take 1-3 Cans by mouth 2 (two) times daily. Additional can, if  he is not eating a day   Yes [provider]  pantoprazole (PROTONIX) 40 MG tablet Take 1 tablet (40 mg total) by mouth daily. 05/22/21  Yes Marykay Lex, MD  potassium chloride SA (KLOR-CON M20) 20 MEQ tablet Take 1 tablet (20 mEq total) by mouth daily. 08/03/23 11/01/23 Yes Milford, Anderson Malta, FNP  sitaGLIPtin (JANUVIA) 50 MG tablet Take 50 mg by mouth daily.   Yes [provider]  spironolactone (ALDACTONE) 25 MG tablet Take 0.5 tablets (12.5 mg total) by mouth  daily. 05/02/23  Yes Laurey Morale, MD  Tiotropium Bromide Monohydrate (SPIRIVA RESPIMAT) 2.5 MCG/ACT AERS Inhale 2 puffs into the lungs at bedtime.   Yes [provider]  Vitamins A & D (VITAMIN A & D) ointment Apply 1 Application topically as needed for dry skin (on bottoms).   Yes [provider]  diphenhydrAMINE (BENADRYL) 25 mg capsule Take 25 mg by mouth at bedtime as needed for sleep. Patient not taking: Reported on 08/06/2023    [provider]    Past Medical History: Past Medical History:  Diagnosis Date   Acid reflux    AICD (automatic cardioverter/defibrillator) present    a. 08/2016 St. Jude (serial Number [id number]) biventricular ICD.   Anemia    Chronic systolic CHF (congestive heart failure) (HCC)    a. 08/2016 Echo: EF 20%, diffuse  HK, inf, post AK, mod-sev MR, sev dil LA, mod TR, PASP .   COPD (chronic obstructive pulmonary disease) (HCC)    Coronary artery disease involving native coronary artery of native heart with angina pectoris (HCC)    a. 10/2012 MI after aortobifem bypass, found to have multivessel CAD -->CABG x3;  b. 08/2016 Cath: LM 60, LAD 52m, LCX 60ost/p, RCA 100p, VG->RPDA 50p, VG->OM1 ok, LIMA->LAD ok, EF 25%.   Essential hypertension 03/03/2012   History of blood transfusion 11/2012   "during his CABG"   History of gout X 1   History of stomach ulcers 1970s   Hyperlipidemia associated with type 2 diabetes mellitus (HCC) 09/16/2011   Ischemic cardiomyopathy    a. 08/2016 Echo: EF 20%, diffuse HK, inf, post AK;  b. 08/2016 St. Jude (serial Number [id number]) biventricular ICD.   Myocardial infarction [employer])    PAD (peripheral artery disease) (HCC)    s/p aortobifemoral bypass 10/2012   Peripheral neuropathy    PTSD (post-traumatic stress disorder)    Type II diabetes mellitus (HCC)    Ventricular tachycardia (HCC)    a. 08/2016 St. Jude (serial Number [id number]) biventricular ICD-->oral amio added.   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 Is now on chronic suppressive antibiotics with dicloxacillin    Past Surgical History: Past Surgical History:  Procedure Laterality Date   ABDOMINAL AORTOGRAM W/LOWER EXTREMITY Left 05/07/2021   Procedure: ABDOMINAL AORTOGRAM W/LOWER EXTREMITY;  Surgeon: [name], MD;  Location: [facility] INVASIVE CV LAB;  Service: Cardiovascular;  Laterality: Left;   AORTO-FEMORAL BYPASS GRAFT Bilateral 10/2012   [facility]   CARDIAC CATHETERIZATION  04/03/2013   [place] (? for abnormal Nuclear ST) --> no PCI, by report, patent grafts.   CARDIAC CATHETERIZATION N/A 09/02/2016   Procedure: Left Heart Cath and Cors/Grafts Angiography;  Surgeon: Kathleene Hazel, MD;  Location: [facility] INVASIVE [facility] LAB:: Ost LM 60% -> mid LAD 99% subCTO, ost-prox LCx 60%; prox RCA 100% CTO w/ (small) SVG->RPDA prox 50%; SVG->OM1 ok, LIMA->mLAD ok, EF 25% - complicated by VT during access - Shock x 2, Lidocaine & Amiodarone   CARDIAC CATHETERIZATION  11/09/2012   [facility]: due to Sustained VT post-Ob Ao-BiFem Bypass. (no Angina)   CHOLECYSTECTOMY  03/05/2012   Procedure: LAPAROSCOPIC CHOLECYSTECTOMY WITH INTRAOPERATIVE CHOLANGIOGRAM;  Surgeon: Emelia Loron, MD;  Location: [facility] OR;  Service: General;  Laterality: N/A;   CORONARY ARTERY BYPASS GRAFT  11/2012   [place] [facility]: CABG X 3 -> LIMA-mLAD, SVG-highOM1, SVG-RPDA (dRCA).   ENDARTERECTOMY FEMORAL Left 05/13/2021   Procedure: Left Femoral Endarterectomy WITH BOVINE PATCH PROFUNDOPLASTY, REVISION OF LEFT LIMB OF AORTO-BIFEMORAL GRAFT WITH DACRON GRAFT;  Surgeon: Cephus Shelling, MD;  Location: [facility] OR;  Service: Vascular;  Laterality: Left;   EP IMPLANTABLE DEVICE N/A 09/03/2016   Procedure: BI-VENTRICULAR IMPLANTABLE CARDIOVERTER DEFIBRILLATOR  (CRT-D);  Surgeon: Marinus Maw, MD;  Location: [facility] INVASIVE CV LAB;  Service:  Cardiovascular;  Laterality: N/A;   INGUINAL HERNIA REPAIR Left 1990s   INSERTION OF ILIAC STENT Left 05/13/2021   Procedure: INSERTION OF SUPERFICIAL FEMORAL ARTERY STENT TIMES THREE AND ANGIOPLASTY;  Surgeon: Cephus Shelling, MD;  Location: [facility] OR;  Service: Vascular;  Laterality: Left;   INTRAOPERATIVE ARTERIOGRAM Left 05/13/2021   Procedure: INTRA OPERATIVE ARTERIOGRAM OF LEFT LOWER EXTREMITY;  Surgeon: Cephus Shelling, MD;  Location: [facility] OR;  Service: Vascular;  Laterality: Left;   NM MYOVIEW LTD  10/05/2022   EF ~28%.  Severely reduced function.  High risk due to decreased EF.  Large fixed severe defect in the mid to basal inferior and inferolateral wall with associated wall motion abnormality consistent with prior infarction.  No evidence of reversible defect/ischemia. =? Echo EF 40-45% with mid to apical anterior/inferolateral akinesis and inferior akinesis.   RIGHT/LEFT HEART CATH AND CORONARY/GRAFT ANGIOGRAPHY N/A 03/25/2023   Procedure: RIGHT/LEFT HEART CATH AND CORONARY/GRAFT ANGIOGRAPHY;  Surgeon: Marykay Lex, MD;  Location: [facility] INVASIVE CV LAB;  Service: Cardiovascular;  Laterality: N/A;   STERNOTOMY  09/2013   [facility]): Sternal Wound Infection -> re-do sternotomy with metal place "sheild" placed. -Has chronic staph infection, on chronic suppressive medication   TEE WITHOUT CARDIOVERSION N/A 03/25/2023   Procedure: TRANSESOPHAGEAL ECHOCARDIOGRAM;  Surgeon: Christell Constant, MD;  Location: [facility];  Service: Cardiovascular;  Laterality: N/A;   TRANSTHORACIC ECHOCARDIOGRAM  10/05/2022   EF 40 to 45%.  Basal to mid anterolateral and inferolateral akinesis with basal inferior akinesis.  GR 2 DD.  Severely elevated PAP with mild RV dilation.  RV P estimated 67 mmHg).  Severe LA dilation.  Mild to moderate RA dilation.  Mild to moderate TR.  Severe MR likely with restricted posterior leaflet and inferolateral wall motion.  Moderate AOV calcification with no  stenosis.  Mild AI.   TRANSTHORACIC ECHOCARDIOGRAM  01/2021   EF 25 to 30%.  Severely depressed EF.  Global HK with worst basal to mid anterolateral and inferolateral as well as anterior basal inferior walls.  GR 1 DD.  Mildly reduced RV function.  Functionally bicuspid aortic valve with moderate calcification but no stenosis.  Overall slightly improved EF.   UMBILICAL HERNIA REPAIR  03/05/2012   Procedure: HERNIA REPAIR UMBILICAL ADULT;  Surgeon: Emelia Loron, MD;  Location: [facility] OR;  Service: General;  Laterality: N/A;    Family History: Family History  Problem Relation Age of Onset   Cancer Brother        esophageal    Social History: Social History   Socioeconomic History   Marital status: Married    Spouse name: Not on file   Number of children: Not on file   Years of education: Not on file   Highest education level: Not on file  Occupational History   Not on file  Tobacco Use   Smoking status: Every Day    Types: Pipe    Passive exposure: Never   Smokeless tobacco: Former    Types: Snuff, Chew   Tobacco comments:    09/03/2016 "used smokeless tobacco in my teens; quit smoking cigarettes in the early 1980s; smokes pipe only"  Vaping Use   Vaping status: Never Used  Substance and Sexual Activity   Alcohol use: No   Drug use: No   Sexual activity: Not on file  Other Topics Concern   Not on file  Social History Narrative   Routine activity around the house, but is limited more by musculoskeletal leg pain and weakness.      He enjoys smoking a pipe-long ago, he quit cigarettes   Social Determinants of Health   Financial Resource Strain: Not on file  Food Insecurity: No Food Insecurity (06/30/2023)   Hunger Vital Sign    Worried About Running Out of Food in the Last Year: Never true    Ran Out of Food in the Last Year: Never true  Transportation Needs: No Transportation Needs (  06/30/2023)   PRAPARE - Administrator, Civil Service (Medical): No     Lack of Transportation (Non-Medical): No  Physical Activity: Not on file  Stress: Not on file  Social Connections: Not on file    Allergies:  Allergies  Allergen Reactions   Lisinopril     Other reaction(s): Cough    Objective:    Vital Signs:   Temp:  [98 F (36.7 C)-98.3 F (36.8 C)] 98.2 F (36.8 C) (11/17 1137) Pulse Rate:  [36-137] 100 (11/17 1100) Resp:  [9-27] 16 (11/17 1100) BP: (94-130)/(46-109) 109/62 (11/17 1100) SpO2:  [66 %-100 %] 100 % (11/17 1100) Weight:  [57.6 kg-58.7 kg] 58.7 kg (11/17 0600) Last BM Date : 08/07/23  Weight change: Filed Weights   08/06/23 1405 08/07/23 0600  Weight: 57.6 kg 58.7 kg    Intake/Output:   Intake/Output Summary (Last 24 hours) at 08/07/2023 1305 Last data filed at 08/07/2023 1207 Gross per 24 hour  Intake 1514.23 ml  Output 930 ml  Net 584.23 ml      Physical Exam    General:  cachetic and ill appearing. No resp difficulty HEENT: normal Neck: supple. JVP 10 . Carotids 2+ bilat; no bruits. No lymphadenopathy or thyromegaly appreciated. Cor: irregular Lungs: clear Abdomen: soft, nontender, nondistended. No hepatosplenomegaly. No bruits or masses. Good bowel sounds. Extremities: no , clubbing, rash, edema cool and cyanotic small ulcer on toe Neuro: alert & orientedx3, cranial nerves grossly intact. moves all 4 extremities w/o difficulty. Affect pleasant   Telemetry   AF 90s Personally reviewed  Labs   Basic Metabolic Panel: Recent Labs  Lab 08/04/23 1630 08/06/23 1419 08/06/23 1456 08/06/23 1551 08/07/23 0418  NA 130* 129* 129* 126* 124*  K 5.0 5.3* 4.7 5.2* 4.6  CL 96* 96* 101  --  90*  CO2 21* 14*  --   --  21*  GLUCOSE 146* 179* 157*  --  89  BUN 34* 52* 47*  --  56*  CREATININE 0.99 1.34* 1.10  --  1.45*  CALCIUM 8.7* 8.0*  --   --  8.4*  MG  --  2.1  --   --  2.3  PHOS  --   --   --   --  5.1*    Liver Function Tests: Recent Labs  Lab 08/06/23 1419 08/07/23 0418  AST 924* 1,829*   ALT 852* 1,541*  ALKPHOS 85 86  BILITOT 0.6 0.6  PROT 5.8* 5.6*  ALBUMIN 3.3* 3.1*   Recent Labs  Lab 08/06/23 1419  LIPASE 34   No results for input(s): "AMMONIA" in the last 168 hours.  CBC: Recent Labs  Lab 08/04/23 1630 08/06/23 1419 08/06/23 1456 08/06/23 1551 08/06/23 2206 08/07/23 0418  WBC 7.7 10.1  --   --  12.2* 13.0*  NEUTROABS  --  8.3*  --   --   --   --   HGB 11.3* 9.8* 11.9* 13.3 10.4* 9.7*  HCT 36.1* 32.1* 35.0* 39.0 34.3* 31.0*  MCV 76.2* 78.7*  --   --  78.1* 75.8*  PLT 307 232  --   --  261 222    Cardiac Enzymes: No results for input(s): "CKTOTAL", "CKMB", "CKMBINDEX", "TROPONINI" in the last 168 hours.  BNP: BNP (last 3 results) Recent Labs    06/30/23 0947 07/14/23 1505 08/06/23 1419  BNP 2,925.9* 1,631.8* 2,725.8*    ProBNP (last 3 results) No results for input(s): "PROBNP" in the last 8760 hours.  CBG: Recent Labs  Lab 08/06/23 2357 08/07/23 0415 08/07/23 0454 08/07/23 0816 08/07/23 1135  GLUCAP 135* 66* 100* 199* 178*    Coagulation Studies: Recent Labs    08/06/23 1419  LABPROT 32.1*  INR 3.1*     Imaging   DG Chest 1 View  Result Date: 08/06/2023 CLINICAL DATA:  Check central line placement EXAM: PORTABLE CHEST 1 VIEW COMPARISON:  Film from earlier in the same day. FINDINGS: New right jugular central line is noted at the cavoatrial junction. Defibrillator is again seen. Postsurgical changes are noted. Cardiac shadow remains enlarged. Lungs are well aerated bilaterally with mild interstitial change stable from the previous exam. No bony abnormality is noted. IMPRESSION: No pneumothorax following central line placement. The remainder of the exam is stable from the prior study. Electronically Signed   By: Alcide Clever M.D.   On: 08/06/2023 21:49   CT Angio Abd/Pel w/ and/or w/o  Result Date: 08/06/2023 CLINICAL DATA:  Abdominal pain and increased lactic acid. History of aortic bypass graft. EXAM: CTA ABDOMEN AND  PELVIS WITHOUT AND WITH CONTRAST TECHNIQUE: Multidetector CT imaging of the abdomen and pelvis was performed using the standard protocol during bolus administration of intravenous contrast. Multiplanar reconstructed images and MIPs were obtained and reviewed to evaluate the vascular anatomy. RADIATION DOSE REDUCTION: This exam was performed according to the departmental dose-optimization program which includes automated exposure control, adjustment of the mA and/or kV according to patient size and/or use of iterative reconstruction technique. CONTRAST:  55mL OMNIPAQUE IOHEXOL 350 MG/ML SOLN, 75mL OMNIPAQUE IOHEXOL 350 MG/ML SOLN COMPARISON:  CT abdomen pelvis dated 04/20/2021. FINDINGS: VASCULAR Aorta: There is moderate atherosclerotic calcification of the abdominal aorta. There is chronic occlusive disease of the infrarenal abdominal aorta with high-grade luminal narrowing. Similar diameter of the abdominal aorta as the prior CT. An aorto bi femoral bypass graft again noted which appears patent. No periaortic fluid collection or contrast extravasation. Celiac: There is atherosclerotic calcification of the origin of the celiac axis. The celiac artery and its branches appear narrow but remain patent. SMA: Atherosclerotic calcification of the origin of the SMA. The SMA remains patent. Renals: Atherosclerotic calcification of the origins of the renal arteries. The renal arteries appear narrow but remain patent. IMA: The IMA is not visualized. 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 There is advanced atherosclerotic calcification with high-grade narrowing of the right internal and external iliac arteries with small amount of flow. The iliac limbs of the bypass graft appear patent. Proximal Outflow: Atherosclerotic calcification of the visualized proximal outflow. Partially visualized stent in the left superficial femoral artery. The  visualized proximal outflow appear patent. Veins: The IVC is unremarkable. The SMV, splenic vein, and main portal vein are patent. No portal venous gas. Review of the MIP images confirms the above findings. NON-VASCULAR Lower chest: Bibasilar atelectasis. The visualized lung bases are otherwise clear. Cardiac pacemaker wires noted. No intra-abdominal free air.  Small ascites. Hepatobiliary: The liver is grossly unremarkable. There is mild biliary dilatation, post cholecystectomy. No retained calcified stone noted in the central CBD. Pancreas: Unremarkable. No pancreatic ductal dilatation or surrounding inflammatory changes. Spleen: Normal in size without focal abnormality. Adrenals/Urinary Tract: The right adrenal gland is unremarkable. 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 There is no hydronephrosis on either side. There is symmetric enhancement and excretion of contrast by both kidneys. The visualized ureters and urinary bladder appear unremarkable. Stomach/Bowel: There is  mild diffuse thickened appearance of the colon which may be related to underdistention and ascites but concerning for colitis. Clinical correlation is recommended. Several small distal colonic diverticula noted. There is no bowel obstruction. No CT evidence of acute appendicitis. Lymphatic: No adenopathy. Reproductive: The prostate and seminal vesicles are grossly unremarkable. No pelvic mass. Other: None Musculoskeletal: Osteopenia with degenerative changes. No acute osseous pathology. IMPRESSION: 1. Findings concerning for pancolitis. Clinical correlation is recommended. No bowel obstruction. 2. Patent aorto bi femoral bypass graft. 3. Small ascites. 4.  Aortic Atherosclerosis (ICD10-I70.0). Electronically Signed   By: Elgie Collard M.D.   On: 08/06/2023 19:54   DG Chest Port 1 View  Result Date: 08/06/2023 CLINICAL DATA:  Increased weakness EXAM: PORTABLE CHEST 1 VIEW COMPARISON:  Chest radiograph  dated 06/30/2023 FINDINGS: Lines/tubes: Left chest wall ICD leads project over the right atrium and ventricle and tributary of the coronary sinus. Lungs: Well inflated lungs. No focal consolidation. Mild interstitial opacities. Pleura: No pneumothorax or pleural effusion. Heart/mediastinum: Similar mildly enlarged cardiomediastinal silhouette. Bones: Postsurgical changes of the sternum. Similar fractured inferior sternotomy wire. IMPRESSION: 1. Mild interstitial opacities, which may represent pulmonary edema. 2. Similar mild cardiomegaly. Electronically Signed   By: Agustin Cree M.D.   On: 08/06/2023 16:52   [place] EKG SITE RITE  Result Date: 08/06/2023 If Site Rite image not attached, placement could not be confirmed due to current cardiac rhythm.    Medications:     Current Medications:  apixaban  5 mg Oral BID   Chlorhexidine Gluconate Cloth  6 each Topical Daily   insulin aspart  0-9 Units Subcutaneous Q4H   nicotine  21 mg Transdermal Daily   sodium chloride flush  10 mL Intravenous Q12H   sodium chloride flush  10 mL Intracatheter Q12H   [START ON 08/11/2023] thiamine (VITAMIN B1) injection  100 mg Intravenous Q24H    Infusions:  amiodarone 60 mg/hr (08/07/23 1124)   milrinone 0.375 mcg/kg/min (08/07/23 1124)   norepinephrine (LEVOPHED) Adult infusion 2 mcg/min (08/07/23 1207)   piperacillin-tazobactam (ZOSYN)  IV Stopped (08/07/23 4034)   [START ON 08/08/2023] thiamine (VITAMIN B1) injection        Assessment/Plan   1. Acute on chronic systolic CHF -> cardiogenic shock: Ischemic cardiomyopathy.  St Jude CRT-D device. TEE in 7/24 showing EF 30-35%, severe MR with restricted posterior leaflet and small flail segment in posterior leaflet, RV normal, mild AI.  RHC 7/24 with elevated filling pressures and low cardiac output. Echo 07/29/23 EF 35-40% RV moderately reduced severe MR Patient is quite frail, NYHA class IIIb symptoms.   - He is now in profound cardiogenic shock in setting of  recurrent AF - Co-ox 26%-> 48% on milrinone 0.375. Will add back NE - CVP 10 Will give IV lasix x 1 - Given baseline functional status and fraility he is not candidate for advanced therapies and severe PAD precludes any form of temporary mechanical support - I think it is likely worth considering DC-CV was more stable to try to restore his baseline - Will consult Palliative Care  2. CAD: s/p CABG in 2014. Cath in 7/24 showed patent grafts.   - No s/s angina  - On ASA 81 - Continue atorvastatin.  Marcelline Deist, he will be able to tolerate spironolactone without hyperkalemia.  Follow BMET closely.  - Mitral regurgitation is severe, hopefully mTEER will help him symptomatically.   3. Mitral regurgitation: TEE (7/24) with severe MR with restricted posterior leaflet and small flail segment in posterior  leaflet.  Suspect primarily infarct-related MR.  I suspect MR is playing a significant role in his symptomatology.  - Suspect he is too far along to receive real benefit from mTEER. I have d/w Structural Team and holding off on mTEER for now   4. Atrial fibrillation, paroxysmal   Has failed amiodarone (lung toxicity) as well as sotalol (break through).  Currently, not on an antiarrhythmic but in NSR.  - He is back in AF now with severe shock. Amio is only option. Continue IV amio  - Continue apixaban 5 mg bid. (He missed one dose last night) - Consider DC-CV  once more stable - Doubt needs TEE with missing just one dose as metabolism of apixban was likely slowed with shock liver kidney  5. Shock liver - continue supprotive care  6. AKI - due to shock Scr 1.1 -> 1.4 - supportive care. Follow  7. PVCs: Frequent.  Continue mexiletine.   8. PAD: S/p aortobifemoral bypass 2014 then SFA stent 2022.  Followed by Dr. Chestine Spore.  Had left foot ulcerations that have healed.  - On digoxin and Coreg to control rate if he enters AF.   CRITICAL CARE Performed by: Arvilla Meres  Total critical care time:  55 minutes  Critical care time was exclusive of separately billable procedures and treating other patients.  Critical care was necessary to treat or prevent imminent or life-threatening deterioration.  Critical care was time spent personally by me (independent of midlevel providers or residents) on the following activities: development of treatment plan with patient and/or surrogate as well as nursing, discussions with consultants, evaluation of patient's response to treatment, examination of patient, obtaining history from patient or surrogate, ordering and performing treatments and interventions, ordering and review of laboratory studies, ordering and review of radiographic studies, pulse oximetry and re-evaluation of patient's condition.   Length of Stay: 1  Arvilla Meres, MD  08/07/2023, 1:05 PM  Advanced Heart Failure Team Pager [phone] (M-F; 7a - 5p)  Please contact [facility] for night-coverage after hours (4p -7a ) and weekends on [email or web address]

## 2023-08-07 NOTE — Consult Note (Signed)
Palliative Medicine Inpatient Consult Note  Consulting Provider:  Dolores Patty, MD   Reason for consult:   Palliative Care Consult Services Palliative Medicine Consult  Reason for Consult? End-stage HF. GOC   08/07/2023  HPI:  Per intake H&P --> 77 year old man w/ hx of CAD post CABG, ischemic cardiomyopathy s/p CRT-D, DM, severe MVR, PADs/p aortobifemoral bypass, Afib presenting with increasing fatigue, weakness, anorexia, N/V, abdominal cramping and FTT. Palliative care consulted for goals of care conversations in the setting of end stage disease.   Clinical Assessment/Goals of Care:  *Please note that this is a verbal dictation therefore any spelling or grammatical errors are due to the "Dragon Medical One" system interpretation.  I have reviewed medical records including EPIC notes, labs and imaging, received report from bedside RN, assessed the patient who is lying in the bed in NAD.    I met with [name] [name] and his wife, [name] [name] to further discuss diagnosis prognosis, GOC, EOL wishes, disposition and options.   I introduced Palliative Medicine as specialized medical care for people living with serious illness. It focuses on providing relief from the symptoms and stress of a serious illness. The goal is to improve quality of life for both the patient and the family.  Medical History Review and Understanding:  A review of [name]'s past medical history was completed inclusive of his coronary artery disease, ischemic cardiomyopathy, diabetes mellitus, severe mitral valve regurgitation, peripheral arterial disease, and atrial fibrillation.  Social History:  [place].  He and his wife have been married for the past 52 years.  They share 1 child-a daughter Rene Kocher and 2 grandchildren Bonita Quin and Marcello Moores.  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  He is a man of the [place] faith.  Functional and Nutritional State:  Preceding hospitalization, [name] was able to get around with a scooter outside.  He utilized a front wheel walker in the home.  He is able to participate in basic activities of daily living.    Armoni had a decent appetite preceding Wednesday though from Wednesday on has had dwindled appetite.  He shares when he tries to eat he feels nauseated thereafter.   Advance Directives:  A detailed discussion was had today regarding advanced directives.  [name] does have a living will though his wife shares she is forgot to bring it in.  I asked [name] to please bring it in so we can impression of his wishes  Code Status:  Concepts specific to code status, artifical feeding and hydration, continued IV antibiotics and rehospitalization was had.  The difference between a aggressive medical intervention path  and a palliative comfort care path for this patient at this time was had.   Sadamu at this time would like to remain full code , he defers to his wife and daughter for further decisions as they relate to resuscitation status.  I was able to explain the potential trauma that can be caused by such an event.  Discussed that patient does have an AICD though he shares he has not yet been shocked by it.  We reviewed the idea of artificial nutrition.  Xander shares if it was needed short-term he would be open to a trial of it.  Provided  "Hard Choices for [employer]" booklet.   Discussion:  We reviewed that [name] had  suffered from heart failure for many years now.  We discussed his recent hospitalization.  We discussed the stages of heart failure and how eventually even with all medical management the disease can still progress.  Patient and his wife share being stunned at the severity of his illness.  He had apparently been working towards a potential mitral valve repair which is now on the back burner in  the setting of his cardiogenic shock.  We reviewed patient's wishes in regards to the quality of life he is living.  We discussed what would and would not be acceptable for him in the near and distant future.  Patient shares that he values his autonomy and his ability to participate in outdoor activities on his scooter such as pruning plants.  He shares that he could not do this he would not want to live.  For the time being the patient would like to allow time to see if he can improve.   Discussed the importance of continued conversation with family and their  medical providers regarding overall plan of care and treatment options, ensuring decisions are within the context of the patients values and GOCs.  Decision Maker: [phone]  SUMMARY OF RECOMMENDATIONS   FULL CODE --> ongoing conversations  Have requested patient's wife bring in advance directives for review and to scanned into the electronic medical record  Open and honest conversations held in the setting of heart failure and the progressive nature of the disease  Allowing time for outcomes  The palliative care team will continue to follow and offer support  Code Status/Advance Care Planning: FULL CODE   Symptom Management:  Adult failure to thrive: -Nutrition involvement -Consideration of core track which patient is open to  Palliative Prophylaxis:  Aspiration, Bowel Regimen, Delirium Protocol, Frequent Pain Assessment, Oral Care, Palliative Wound Care, and Turn Reposition  Additional Recommendations (Limitations, Scope, Preferences): Continue current care  Psycho-social/Spiritual:  Desire for further Chaplaincy support: Yes patient is a [place] Additional Recommendations: Education on heart failure progression   Prognosis: Poor overall.  Discharge Planning: Discharge plan is uncertain at this time.  Vitals:   08/07/23 1330 08/07/23 1345  BP:  (!) 87/56  Pulse: (!) 101 80  Resp: 17 15   Temp:    SpO2: (!) 81% (!) 75%    Intake/Output Summary (Last 24 hours) at 08/07/2023 1615 Last data filed at 08/07/2023 1500 Gross per 24 hour  Intake 1514.23 ml  Output 1330 ml  Net 184.23 ml   Last Weight  Most recent update: 08/07/2023  6:13 AM    Weight  58.7 kg (129 lb 6.6 oz)            Gen: Extremely frail elderly Caucasian male chronically ill in appearance HEENT: moist mucous membranes CV: Regular rate and irregular rhythm  PULM: On 3 L nasal cannula breathing is even and nonlabored ABD: soft/nontender  EXT: LE edema Neuro: Alert and oriented x3 -hard of hearing  PPS: 10%   This conversation/these recommendations were discussed with patient primary care team, Dr. Denese Killings  Billing based on MDM: High  Problems Addressed: One acute or chronic illness or injury that poses a threat to life or bodily function  Amount and/or Complexity of Data: Category 3:Discussion of management or test interpretation with external physician/other qualified health care professional/appropriate source (not separately reported)  Risks: Drug therapy requiring intensive monitoring for toxicity, Decision regarding hospitalization or escalation of hospital care, and Decision not to resuscitate or to de-escalate care  because of poor prognosis ______________________________________________________ Lamarr Lulas [place] Palliative Medicine Team Team Cell Phone: [phone] Please utilize secure chat with additional questions, if there is no response within 30 minutes please call the above phone number  Palliative Medicine Team providers are available by phone from 7am to 7pm daily and can be reached through the team cell phone.  Should this patient require assistance outside of these hours, please call the patient's attending physician.

## 2023-08-07 NOTE — Progress Notes (Addendum)
Rounding Note    Patient Name: [name] [name] Date of Encounter: 08/07/2023  [facility] Cardiologist: Bryan Lemma, MD   Subjective   After admission yesterday, he had central line placed and was started on milrinone and levophed. See numbers below. Wife at bedside, no acute events, but discussed the severity of his shock and how we are managing. Rates better controlled on IV amiodarone.  Inpatient Medications    Scheduled Meds:  Chlorhexidine Gluconate Cloth  6 each Topical Daily   insulin aspart  0-9 Units Subcutaneous Q4H   nicotine  21 mg Transdermal Daily   sodium chloride flush  10 mL Intravenous Q12H   sodium chloride flush  10 mL Intracatheter Q12H   [START ON 08/11/2023] thiamine (VITAMIN B1) injection  100 mg Intravenous Q24H   Continuous Infusions:  amiodarone 30 mg/hr (08/07/23 0837)   milrinone 0.375 mcg/kg/min (08/07/23 0700)   norepinephrine (LEVOPHED) Adult infusion     piperacillin-tazobactam (ZOSYN)  IV 12.5 mL/hr at 08/07/23 0700   thiamine (VITAMIN B1) injection     Followed by   Melene Muller ON 08/08/2023] thiamine (VITAMIN B1) injection     PRN Meds: acetaminophen, docusate sodium, HYDROmorphone (DILAUDID) injection, polyethylene glycol   Vital Signs    Vitals:   08/07/23 0422 08/07/23 0500 08/07/23 0600 08/07/23 0700  BP:  112/62 99/64 114/77  Pulse:   82 98  Resp:  11 18 15   Temp: 98.3 F (36.8 C)     TempSrc: Oral     SpO2:    98%  Weight:   58.7 kg   Height:   5\' 6"  (1.676 m)     Intake/Output Summary (Last 24 hours) at 08/07/2023 0906 Last data filed at 08/07/2023 0700 Gross per 24 hour  Intake 1310.14 ml  Output 630 ml  Net 680.14 ml      08/07/2023    6:00 AM 08/06/2023    2:05 PM 08/04/2023    3:02 PM  Last 3 Weights  Weight (lbs) 129 lb 6.6 oz 127 lb 127 lb  Weight (kg) 58.7 kg 57.607 kg 57.607 kg      Telemetry    Atrial fibrillation, rates  improved to 80s-90s- Personally Reviewed  Physical Exam    GEN: frail, chronically ill appearing. Saturating well on room air (South  around chin) Neck: No JVD sitting at 45 degrees Cardiac: irregularly irregular, rate improved from yesterday. Systolic murmur now 2/6 from 3/6 yesterday Respiratory: largely clear, no rales appreciated GI: Soft, nontender, non-distended  MS: Extremities warmer than yesterday, slow cap refill, minimal pedal edema Neuro:  Nonfocal  Psych: Normal affect   New pertinent results (labs, ECG, imaging, cardiac studies)     Patient Profile     77 y.o. male with a hx of CAD s/p CABG, chronic ischemic cardiomyopathy, severe MR, PAD, paroxysmal atrial fibrillation who is being seen for atrial fibrillation with RVR and cardiogenic shock.   Assessment & Plan    Atrial fibrillation with RVR Acute on chronic systolic and diastolic heart failure Elevated lactate, elevated transaminases, decreased distal perfusion supports cardiogenic shock Hyponatremia, worse than his chronic level -see comments re: his pacing on initial consult note -started on IV amio for rate control and milrinone for cardiogenic shock. Rates much better controlled. -lactate decreasing from peak of 8.7 to 3.7 -transaminases rising into the 1500-1800 range today -sodium decreasing from 129 to 124 this admission -initial central mixed venous sat 25.8. This AM 34.4. he is on milrinone 0.375 mcg/kg/min -Cvp  measurements have been variable overnight, ranging from 4 to 31 per chart. Checked at bedside by Dr. Denese Killings, CVP 11. He is satting well on room air and lungs are largely clear. Would focus on improving forward flow for now and hold on diuresis -did require levophed overnight. Will restart to keep systolic >100 -limited echo ordered   Severe MR -planned for mitraclip in December. Suspect severe MR is also making afib persistent, and rapid afib is preventing ideal filling times and worsening forward flow -unclear if the mitraclip could occur sooner -on  exam, murmur is slightly softer than yesterday -continue to work on rate control and afterload reduction   CAD s/p CABG Elevated troponins -no chest pain, elevated troponins consistent with demand given his low output state  Debility -appears cachectic/thin. I agree with Dr. Denese Killings that a temporary feeding tube may be helpful to improve his nutritional status and allow him to tolerate mitraclip/recovery better  Discussed with Dr. Gala Romney with advanced heart failure for consultation.   CRITICAL CARE Patient is critically ill with multiple organ systems affected and requires high complexity decision making. Total critical care time: 45 minutes. This time includes extensive review of vitals, labs, and testing, evaluation of patient's response to treatment, examination of patient, discussion with family, and discussing with team members including nursing and other physician consultants. Specifically, time was spent discussing plan with Dr. Denese Killings and Leota Sauers, PharmD.  Signed, Jodelle Red, MD  08/07/2023, 9:06 AM

## 2023-08-07 NOTE — Progress Notes (Signed)
Secure chat with Dr Katrinka Blazing.  CVC placed last pm and 2 PIV's present. PICC line order canceled.

## 2023-08-07 NOTE — H&P (Signed)
NAME:  [name] [name], MRN:  [id number], DOB:  1945/12/02, LOS: 1 ADMISSION DATE:  08/06/2023, CONSULTATION DATE:  08/06/23 REFERRING MD:  EDP, CHIEF COMPLAINT:  fatigue   History of Present Illness:  77 year old man w/ hx of CAD post CABG, ischemic cardiomyopathy s/p CRT-D, DM, severe MVR, PADs/p aortobifemoral bypass, Afib presenting with increasing fatigue, weakness, anorexia, N/V, abdominal cramping and FTT.  Workup has revealed acute liver injury, lactic acidosis.  Cardiology is consulted and PCCM to admit.  Pertinent  Medical History   Past Medical History:  Diagnosis Date   Acid reflux    AICD (automatic cardioverter/defibrillator) present    a. 08/2016 St. Jude (serial Number [id number]) biventricular ICD.   Anemia    Chronic systolic CHF (congestive heart failure) (HCC)    a. 08/2016 Echo: EF 20%, diffuse HK, inf, post AK, mod-sev MR, sev dil LA, mod TR, PASP .   COPD (chronic obstructive pulmonary disease) (HCC)    Coronary artery disease involving native coronary artery of native heart with angina pectoris (HCC)    a. 10/2012 MI after aortobifem bypass, found to have multivessel CAD -->CABG x3;  b. 08/2016 Cath: LM 60, LAD 61m, LCX 60ost/p, RCA 100p, VG->RPDA 50p, VG->OM1 ok, LIMA->LAD ok, EF 25%.   Essential hypertension 03/03/2012   History of blood transfusion 11/2012   "during his CABG"   History of gout X 1   History of stomach ulcers 1970s   Hyperlipidemia associated with type 2 diabetes mellitus (HCC) 09/16/2011   Ischemic cardiomyopathy    a. 08/2016 Echo: EF 20%, diffuse HK, inf, post AK;  b. 08/2016 St. Jude (serial Number [id number]) biventricular ICD.   Myocardial infarction [facility])    PAD (peripheral artery disease) (HCC)    s/p aortobifemoral bypass 10/2012   Peripheral neuropathy    PTSD (post-traumatic stress disorder)    Type II diabetes mellitus (HCC)    Ventricular tachycardia (HCC)    a. 08/2016 St. Jude (serial Number [id number]) biventricular ICD-->oral  amio added.   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 Is now on chronic suppressive antibiotics with dicloxacillin   Significant Hospital Events: Including procedures, antibiotic start and stop dates in addition to other pertinent events   11/16 admit  Interim History / Subjective:  Patient denies specific complaints, but remains slightly altered. Per cardiologist, clinical perfusion is much better today.   Objective   Blood pressure 114/77, pulse 98, temperature 98.3 F (36.8 C), temperature source Oral, resp. rate 15, height 5\' 6"  (1.676 m), weight 58.7 kg, SpO2 98%. CVP:  [11 mmHg-31 mmHg] 31 mmHg      Intake/Output Summary (Last 24 hours) at 08/07/2023 0856 Last data filed at 08/07/2023 0700 Gross per 24 hour  Intake 1310.14 ml  Output 630 ml  Net 680.14 ml   Filed Weights   08/06/23 1405 08/07/23 0600  Weight: 57.6 kg 58.7 kg    Examination: General: ill appearing man, cachectic  HENT: MMM, not icteric Lungs: no accessory muscle use. Saturating 99% on room air. Few crackles at the right base only.  Cardiovascular: irregular tachy wide complex afib with bundle block. CVP 12. Apex diffuse, displaced. 3/6 HSM radiating to the axilla.  Abdomen: soft, +BS Extremities: mottled, trace edema. Poor capillary refill.  Neuro: no focal deficits, follows commands but keep repeating things.   Ancillary Tests Personally Reviewed:   Na 124 Creatinine 1.45  AST/ALT in 1000's BNP 2725 Lactate 3.7 HB: 9.7 - microcytic and hypochromic ScvO2 still only 34.4 Assessment & Plan:   Critically ill due to cardiogenic shock from acute decompensated systolic heart failure from ischemic cardiomyopathy complicated by shock liver, evolving AKI and encephalopathy Afib/RVR on AC PTA- failed recent cardioversion. RVR may have contributed to decompensation.  Severe MVR-  was scheduled for M-TEER in 12/24. Questionable amio toxicity diagnosed by PFTs and symptoms alone: not sure we can call this true toxicity and benefits outweigh risks PVD DM2 COPD ACID in place PUD previous sternal infection requiring reconstruction Iron deficiency anemia of unknown source.  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   Plan:   - Continue milrinone and follow ScvO2. - Added norepinephrine for marginal systolic and additional inotropic support.  - As not clearly volume overloaded, will hold on further furosemide for now.  - Follow renal function. Should improve with correction of low flow state.  - Severe malnutrition may be contributing to worsening cardiac function, particularly from micronutrient deficiencies. Will add high dose thiamine - Needs prehabilitation prior to M-TEER. Will place Cortrak to ensure adequate enteral nutrition.  - Suspect GI losses causing anemia, but currently too sick for elective endoscopy. Will check iron studies and give IV iron as appropriate. - Continue Zosyn for pancolitis for now - likely due to hypoperfusion. Can potentially stop once shock reversed.    Best Practice (right click and "Reselect all SmartList Selections" daily)   Diet/type: Regular consistency (see orders) - will start supplemental feeds.  DVT prophylaxis: systemic heparin for Afib GI prophylaxis: N/A Lines: N/A Foley:  Yes, and it is still needed Code Status:  full code Last date of multidisciplinary goals of care discussion [08/06/23]  CRITICAL CARE Performed by: Lynnell Catalan   Total critical care time: 40 minutes  Critical care time was exclusive of separately billable procedures and treating other patients.  Critical care was necessary to treat or prevent imminent or life-threatening deterioration.  Critical care was time spent personally by me on the following activities: development of treatment plan with patient and/or surrogate as well as nursing,  discussions with consultants, evaluation of patient's response to treatment, examination of patient, obtaining history from patient or surrogate, ordering and performing treatments and interventions, ordering and review of laboratory studies, ordering and review of radiographic studies, pulse oximetry, re-evaluation of patient's condition and participation in multidisciplinary rounds.  Lynnell Catalan, MD [facility] ICU Physician [facility]  Pager: [phone] Mobile: [phone] After hours: [phone].,

## 2023-08-08 ENCOUNTER — Inpatient Hospital Stay (HOSPITAL_COMMUNITY): Payer: Medicare Other

## 2023-08-08 ENCOUNTER — Other Ambulatory Visit (HOSPITAL_COMMUNITY): Payer: No Typology Code available for payment source

## 2023-08-08 DIAGNOSIS — K6389 Other specified diseases of intestine: Secondary | ICD-10-CM | POA: Diagnosis not present

## 2023-08-08 DIAGNOSIS — Z515 Encounter for palliative care: Secondary | ICD-10-CM | POA: Diagnosis not present

## 2023-08-08 DIAGNOSIS — I5023 Acute on chronic systolic (congestive) heart failure: Secondary | ICD-10-CM

## 2023-08-08 DIAGNOSIS — Z720 Tobacco use: Secondary | ICD-10-CM | POA: Diagnosis not present

## 2023-08-08 DIAGNOSIS — R570 Cardiogenic shock: Secondary | ICD-10-CM | POA: Diagnosis not present

## 2023-08-08 DIAGNOSIS — E43 Unspecified severe protein-calorie malnutrition: Secondary | ICD-10-CM | POA: Insufficient documentation

## 2023-08-08 DIAGNOSIS — R140 Abdominal distension (gaseous): Secondary | ICD-10-CM | POA: Diagnosis not present

## 2023-08-08 DIAGNOSIS — Z7189 Other specified counseling: Secondary | ICD-10-CM | POA: Diagnosis not present

## 2023-08-08 LAB — COOXEMETRY PANEL
Carboxyhemoglobin: 2.1 % — ABNORMAL HIGH (ref 0.5–1.5)
Carboxyhemoglobin: 1.9 % — ABNORMAL HIGH (ref 0.5–1.5)
Carboxyhemoglobin: 1.5 % (ref 0.5–1.5)
Carboxyhemoglobin: 1.2 % (ref 0.5–1.5)
Methemoglobin: <0.7 % (ref 0.0–1.5)
Methemoglobin: <0.7 % (ref 0.0–1.5)
Methemoglobin: 1.4 % (ref 0.0–1.5)
Methemoglobin: <0.7 % (ref 0.0–1.5)
O2 Saturation: 82.9 %
O2 Saturation: 52.8 %
O2 Saturation: 53 %
O2 Saturation: 55.9 %
Total hemoglobin: 9 g/dL — ABNORMAL LOW (ref 12.0–16.0)
Total hemoglobin: 8.7 g/dL — ABNORMAL LOW (ref 12.0–16.0)
Total hemoglobin: 9.6 g/dL — ABNORMAL LOW (ref 12.0–16.0)
Total hemoglobin: 9.6 g/dL — ABNORMAL LOW (ref 12.0–16.0)

## 2023-08-08 LAB — CBC
HCT: 28.6 % — ABNORMAL LOW (ref 39.0–52.0)
Hemoglobin: 9.1 g/dL — ABNORMAL LOW (ref 13.0–17.0)
MCH: 23.6 pg — ABNORMAL LOW (ref 26.0–34.0)
MCHC: 31.8 g/dL (ref 30.0–36.0)
MCV: 74.1 fL — ABNORMAL LOW (ref 80.0–100.0)
Platelets: 186 10*3/uL (ref 150–400)
RBC: 3.86 MIL/uL — ABNORMAL LOW (ref 4.22–5.81)
RDW: 17 % — ABNORMAL HIGH (ref 11.5–15.5)
WBC: 10.1 10*3/uL (ref 4.0–10.5)
nRBC: 0.9 % — ABNORMAL HIGH (ref 0.0–0.2)

## 2023-08-08 LAB — HEPATIC FUNCTION PANEL
ALT: 1775 U/L — ABNORMAL HIGH (ref 0–44)
AST: 1586 U/L — ABNORMAL HIGH (ref 15–41)
Albumin: 3 g/dL — ABNORMAL LOW (ref 3.5–5.0)
Alkaline Phosphatase: 87 U/L (ref 38–126)
Bilirubin, Direct: 0.2 mg/dL (ref 0.0–0.2)
Indirect Bilirubin: 0.7 mg/dL (ref 0.3–0.9)
Total Bilirubin: 0.9 mg/dL (ref <1.2)
Total Protein: 5.3 g/dL — ABNORMAL LOW (ref 6.5–8.1)

## 2023-08-08 LAB — MAGNESIUM: Magnesium: 1.9 mg/dL (ref 1.7–2.4)

## 2023-08-08 LAB — GLUCOSE, CAPILLARY
Glucose-Capillary: 130 mg/dL — ABNORMAL HIGH (ref 70–99)
Glucose-Capillary: 132 mg/dL — ABNORMAL HIGH (ref 70–99)
Glucose-Capillary: 141 mg/dL — ABNORMAL HIGH (ref 70–99)
Glucose-Capillary: 166 mg/dL — ABNORMAL HIGH (ref 70–99)
Glucose-Capillary: 175 mg/dL — ABNORMAL HIGH (ref 70–99)
Glucose-Capillary: 306 mg/dL — ABNORMAL HIGH (ref 70–99)
Glucose-Capillary: 316 mg/dL — ABNORMAL HIGH (ref 70–99)

## 2023-08-08 LAB — BASIC METABOLIC PANEL
Anion gap: 11 (ref 5–15)
BUN: 39 mg/dL — ABNORMAL HIGH (ref 8–23)
CO2: 28 mmol/L (ref 22–32)
Calcium: 8.1 mg/dL — ABNORMAL LOW (ref 8.9–10.3)
Chloride: 87 mmol/L — ABNORMAL LOW (ref 98–111)
Creatinine, Ser: 1.26 mg/dL — ABNORMAL HIGH (ref 0.61–1.24)
GFR, Estimated: 59 mL/min — ABNORMAL LOW (ref >60)
Glucose, Bld: 139 mg/dL — ABNORMAL HIGH (ref 70–99)
Potassium: 3 mmol/L — ABNORMAL LOW (ref 3.5–5.1)
Sodium: 126 mmol/L — ABNORMAL LOW (ref 135–145)

## 2023-08-08 MED ORDER — FUROSEMIDE 10 MG/ML IJ SOLN
80.0000 mg | Freq: Once | INTRAMUSCULAR | Status: AC
  Administered 2023-08-08: 80 mg via INTRAVENOUS
  Filled 2023-08-08: qty 8

## 2023-08-08 MED ORDER — POTASSIUM CHLORIDE 10 MEQ/50ML IV SOLN
10.0000 meq | INTRAVENOUS | Status: AC
  Administered 2023-08-08 (×6): 10 meq via INTRAVENOUS
  Filled 2023-08-08 (×6): qty 50

## 2023-08-08 MED ORDER — OSMOLITE 1.5 CAL PO LIQD
1000.0000 mL | ORAL | Status: DC
  Administered 2023-08-08 – 2023-08-10 (×3): 1000 mL
  Filled 2023-08-08: qty 1000

## 2023-08-08 MED ORDER — ASPIRIN 81 MG PO CHEW
81.0000 mg | CHEWABLE_TABLET | Freq: Once | ORAL | Status: AC
  Administered 2023-08-08: 81 mg via ORAL
  Filled 2023-08-08: qty 1

## 2023-08-08 MED ORDER — MAGNESIUM SULFATE 2 GM/50ML IV SOLN
2.0000 g | Freq: Once | INTRAVENOUS | Status: AC
  Administered 2023-08-08: 2 g via INTRAVENOUS
  Filled 2023-08-08: qty 50

## 2023-08-08 MED ORDER — TRAZODONE HCL 50 MG PO TABS
50.0000 mg | ORAL_TABLET | Freq: Every evening | ORAL | Status: DC | PRN
  Administered 2023-08-08 – 2023-08-10 (×3): 50 mg via ORAL
  Filled 2023-08-08 (×3): qty 1

## 2023-08-08 MED ORDER — BENZONATATE 100 MG PO CAPS: 100.0000 mg | ORAL_CAPSULE | Freq: Three times a day (TID) | ORAL | Status: DC | PRN

## 2023-08-08 MED ORDER — IPRATROPIUM-ALBUTEROL 0.5-2.5 (3) MG/3ML IN SOLN: 3.0000 mL | RESPIRATORY_TRACT | Status: DC | PRN

## 2023-08-08 MED ORDER — ENSURE ENLIVE PO LIQD
237.0000 mL | Freq: Two times a day (BID) | ORAL | Status: DC
  Administered 2023-08-08 – 2023-08-12 (×6): 237 mL via ORAL

## 2023-08-08 MED ORDER — PROSOURCE TF20 ENFIT COMPATIBL EN LIQD
60.0000 mL | Freq: Every day | ENTERAL | Status: DC
  Administered 2023-08-08 – 2023-08-14 (×6): 60 mL
  Filled 2023-08-08 (×6): qty 60

## 2023-08-08 MED ORDER — ATORVASTATIN CALCIUM 40 MG PO TABS
40.0000 mg | ORAL_TABLET | Freq: Every day | ORAL | Status: DC
  Administered 2023-08-08 – 2023-08-15 (×8): 40 mg via ORAL
  Filled 2023-08-08 (×8): qty 1

## 2023-08-08 NOTE — Progress Notes (Deleted)
eLink Physician-Brief Progress Note Patient Name: [name] [name] DOB: March 14, 1946 MRN: [id number]   Date of Service  08/08/2023  HPI/Events of Note  Disregard this note.     Intervention Category Intermediate Interventions: Other:  Ranee Gosselin 08/08/2023, 12:19 AM

## 2023-08-08 NOTE — Progress Notes (Signed)
Initial Nutrition Assessment  DOCUMENTATION CODES:   Severe malnutrition in context of chronic illness  INTERVENTION:  Liberalize diet to regular to promote nutritional adequacy Ensure Enlive po BID, each supplement provides 350 kcal and 20 grams of protein. Initiate tube feeding via Cortrak: Start Osmolite 1.5 at 20 ml/h and advance by 10ml q10h to a goal rate of 44ml/hr (1080 ml per day) Prosource TF20 60 ml once daily TF at goal provides 1700 kcal, 88 gm protein, 823 ml free water daily Monitor magnesium, potassium, and phosphorus BID for at least 3 days, MD to replete as needed, as pt is at risk for refeeding syndrome   NUTRITION DIAGNOSIS:   Severe Malnutrition related to chronic illness (CAD, ischemic CM, severe MVR) as evidenced by severe fat depletion, severe muscle depletion.  GOAL:   Patient will meet greater than or equal to 90% of their needs  MONITOR:   PO intake, Supplement acceptance, Labs, Weight trends, Skin, TF tolerance, I & O's  REASON FOR ASSESSMENT:   Consult Assessment of nutrition requirement/status, Enteral/tube feeding initiation and management  ASSESSMENT:   Pt admitted with fatigue, weakness, anorexia, n/v, abdominal cramping d/t cardiogenic shock. PMH significant for CAD s/p CABG, ischemic cardiomyopathy s/p CRT-D,  DM, severe MVR, PAD s/p aortobifemoral bypass, afib.  Spoke with pt at bedside. His daughter also present at time of visit, assisting to provide nutrition related history.   She reports a h/o of ongoing malnutrition. [facility] Ensure Plus High protein nutrition supplements for pt which he supplements on average twice daily when he is not eating well. She reports that about 1 month ago his heart was shocked. Follow that procedure, his intake had improved and he was eating better and supplementing less with Ensure. Unfortunately, his appetite began gradually decreasing again. He has not had a meal since Wednesday (~6 days ago). He  did consume an Ensure on Thursday. Pt reports that he has been reliant on Coca Cola. Over the last week, pt endorses nausea with PO intake, more notably with orange juice.   Pt endorses feeling hungry today. Pt was NPO at time of visit. Diet now resumed. Cortrak RD reports pt was eating on initial attempt to place, allowed more time to eat prior to placement.   RN reports having made a shake for him yesterday however he experienced some emesis. Unclear how frequently emesis occurs.    S/p Cortrak placement. Reached out to MD to review xray. Agrees to starting TF. Bowel appears to have lots of gas, monitoring for possible development of ileus. MD reports plans to hold TF tomorrow morning temporarily for cardioversion.   Pt unsure of weight changes. Pt's daughter reports that he was 124 lbs last Monday and was 131 on admission d/t fluid retention. Reviewed weight history. Pt noted to have had a weight loss of 13% between 08/06/22-05/02/23. Since 09/23, pt's weight noted to increase 2.4 kg.   Edema: Nursing documentation - moderate pitting RUE, LLE RD assessment: mild BLE  Medications: SSI 0-9 units q4h, thiamine Drips: Amio Milrinone Levo @ 62mcg/min Abx  Labs: sodium 126, potassium 3.0, BUN 39, Cr 1.26, AST 1586, ALT 1775, GFR 59, CBG's 130-244 x24 hours  UOP: x24 hours  NUTRITION - FOCUSED PHYSICAL EXAM:  Flowsheet Row Most Recent Value  Orbital Region Severe depletion  Upper Arm Region Moderate depletion  Thoracic and Lumbar Region Severe depletion  Buccal Region Moderate depletion  Temple Region Severe depletion  Clavicle Bone Region Severe depletion  Clavicle and Acromion Bone Region Severe depletion  Scapular Bone Region Severe depletion  Dorsal Hand Severe depletion  Patellar Region Severe depletion  Anterior Thigh Region Severe depletion  Posterior Calf Region Severe depletion  Edema (RD Assessment) Mild  Hair Reviewed  Eyes Reviewed  Mouth Reviewed  Skin  Reviewed  Nails Reviewed       Diet Order:   Diet Order             Diet NPO time specified  Diet effective midnight           Diet Heart Room service appropriate? Yes; Fluid consistency: Thin  Diet effective now                   EDUCATION NEEDS:   Education needs have been addressed  Skin:  Skin Assessment: Skin Integrity Issues: Skin Integrity Issues:: Stage II Stage II: bilateral coccyx  Last BM:  11/18 type 7 small  Height:   Ht Readings from Last 1 Encounters:  08/07/23 5\' 6"  (1.676 m)    Weight:   Wt Readings from Last 1 Encounters:  08/08/23 57.3 kg   BMI:  Body mass index is 20.39 kg/m.  Estimated Nutritional Needs:   Kcal:  1700-1900  Protein:  85-100g  Fluid:  >/=1.8L  Drusilla Kanner, RDN, LDN Clinical Nutrition

## 2023-08-08 NOTE — Progress Notes (Signed)
[facility] ADULT ICU REPLACEMENT PROTOCOL   The patient does apply for the [facility] Adult ICU Electrolyte Replacment Protocol based on the criteria listed below:   1.Exclusion criteria: TCTS, ECMO, Dialysis, and Myasthenia Gravis patients 2. Is GFR >/= 30 ml/min? Yes.    Patient's GFR today is 59 3. Is SCr </= 2? Yes.   Patient's SCr is 1.26 mg/dL 4. Did SCr increase >/= 0.5 in 24 hours? No. 5.Pt's weight >40kg  Yes.   6. Abnormal electrolyte(s): K+ 3.0  7. Electrolytes replaced per protocol 8.  Call MD STAT for K+ </= 2.5, Phos </= 1, or Mag </= 1 Physician:  Dr Eather Colas, Jettie Booze 08/08/2023 5:15 AM

## 2023-08-08 NOTE — Progress Notes (Signed)
Advanced Heart Failure Rounding Note  PCP-Cardiologist: Bryan Lemma, MD   Subjective:     CO-OX 83% > 53% on milrinone 0.375 + NE 3.   2.1L UOP last 24 hrs. Received 80 mg lasix IV yesterday. CVP 6.   Scr slightly improved 1.26 K 3.0 Na 126  Remains in AF, 90s.  Much more alert today per his daughter. Denies dyspnea. Very hungry.    Echo, 08/07/23:  EF 25-30%, RV mildly reduced   Objective:   Weight Range: 57.3 kg Body mass index is 20.39 kg/m.   Vital Signs:   Temp:  [97.8 F (36.6 C)-98.8 F (37.1 C)] 98.8 F (37.1 C) (11/18 0022) Pulse Rate:  [78-118] 90 (11/18 0345) Resp:  [10-25] 18 (11/18 0615) BP: (69-122)/(39-109) 100/57 (11/18 0615) SpO2:  [75 %-100 %] 98 % (11/18 0345) Weight:  [57.3 kg] 57.3 kg (11/18 0639) Last BM Date : 08/07/23  Weight change: Filed Weights   08/06/23 1405 08/07/23 0600 08/08/23 0639  Weight: 57.6 kg 58.7 kg 57.3 kg    Intake/Output:   Intake/Output Summary (Last 24 hours) at 08/08/2023 0714 Last data filed at 08/08/2023 1308 Gross per 24 hour  Intake 1128.03 ml  Output 2130 ml  Net -1001.97 ml      Physical Exam   CVP 6 General:  Cachectic. Chronically ill appearing. HEENT: Normal Neck: Supple. R internal jugular CVC Cor: PMI nondisplaced. Irregular rhythm. No rubs, gallops, 3/6 MR murmur. Lungs: Clear Abdomen: Soft, nontender, nondistended.  Extremities: No cyanosis, clubbing, rash, edema, callus right great toe, atrophic nail changes Neuro: Alert & orientedx3. Affect pleasant   Telemetry   Afib 90s   Labs    CBC Recent Labs    08/06/23 1419 08/06/23 1456 08/07/23 0418 08/08/23 0356  WBC 10.1   < > 13.0* 10.1  NEUTROABS 8.3*  --   --   --   HGB 9.8*   < > 9.7* 9.1*  HCT 32.1*   < > 31.0* 28.6*  MCV 78.7*   < > 75.8* 74.1*  PLT 232   < > 222 186   < > = values in this interval not displayed.   Basic Metabolic Panel Recent Labs    65/78/46 0418 08/08/23 0356  NA 124* 126*  K 4.6  3.0*  CL 90* 87*  CO2 21* 28  GLUCOSE 89 139*  BUN 56* 39*  CREATININE 1.45* 1.26*  CALCIUM 8.4* 8.1*  MG 2.3 1.9  PHOS 5.1*  --    Liver Function Tests Recent Labs    08/07/23 0418 08/08/23 0356  AST 1,829* 1,586*  ALT 1,541* 1,775*  ALKPHOS 86 87  BILITOT 0.6 0.9  PROT 5.6* 5.3*  ALBUMIN 3.1* 3.0*   Recent Labs    08/06/23 1419  LIPASE 34   Cardiac Enzymes No results for input(s): "CKTOTAL", "CKMB", "CKMBINDEX", "TROPONINI" in the last 72 hours.  BNP: BNP (last 3 results) Recent Labs    06/30/23 0947 07/14/23 1505 08/06/23 1419  BNP 2,925.9* 1,631.8* 2,725.8*    ProBNP (last 3 results) No results for input(s): "PROBNP" in the last 8760 hours.   D-Dimer No results for input(s): "DDIMER" in the last 72 hours. Hemoglobin A1C No results for input(s): "HGBA1C" in the last 72 hours. Fasting Lipid Panel No results for input(s): "CHOL", "HDL", "LDLCALC", "TRIG", "CHOLHDL", "LDLDIRECT" in the last 72 hours. Thyroid Function Tests No results for input(s): "TSH", "T4TOTAL", "T3FREE", "THYROIDAB" in the last 72 hours.  Invalid input(s): "FREET3"  Other  results:   Imaging    ECHOCARDIOGRAM LIMITED  Result Date: 08/07/2023    ECHOCARDIOGRAM LIMITED REPORT   Patient Name:   TJAY COOPERWOOD Date of Exam: 08/07/2023 Medical Rec #:  [id number]       Height:       66.0 in Accession #:    [id number]      Weight:       129.4 lb Date of Birth:  03-21-1946        BSA:          1.662 m Patient Age:    77 years        BP:           95/46 mmHg Patient Gender: M               HR:           88 bpm. Exam Location:  Inpatient Procedure: Limited Echo, Limited Color Doppler and Cardiac Doppler Indications:    assess LVF and RVF  History:        Patient has prior history of Echocardiogram examinations, most                 recent 07/29/2023. CHF, CAD, Prior CABG and Defibrillator;                 Arrythmias:Atrial Fibrillation.  Sonographer:    Melissa Morford RDCS (AE, PE) Referring  Phys: BRIDGETTE CHRISTOPHER IMPRESSIONS  1. Left ventricular ejection fraction, by estimation, is 25 to 30%. The left ventricle has severely decreased function. Left ventricular endocardial border not optimally defined to evaluate regional wall motion. The left ventricular internal cavity size  was severely dilated.  2. Right ventricular systolic function is mildly reduced. The right ventricular size is mildly enlarged. There is mildly elevated pulmonary artery systolic pressure. The estimated right ventricular systolic pressure is 44.7 mmHg.  3. Tricuspid valve regurgitation is mild to moderate.  4. The inferior vena cava is normal in size with greater than 50% respiratory variability, suggesting right atrial pressure of 3 mmHg. Comparison(s): Prior images reviewed side by side. Changes from prior study are noted. Since echo 11/8, LV has dilated further. Overall LV function visually is similar to slightly reduced compared to prior. RV appears similar, and IVC is not dilated/collapes. RA pressure measured at 3 mmHg, PA pressure elevated at 44 mmHg. There is significant dyssynchrony in the LV due to afib and lack of biventricular pacing. FINDINGS  Left Ventricle: Left ventricular ejection fraction, by estimation, is 25 to 30%. The left ventricle has severely decreased function. Left ventricular endocardial border not optimally defined to evaluate regional wall motion. The left ventricular internal cavity size was severely dilated. Right Ventricle: The right ventricular size is mildly enlarged. Right ventricular systolic function is mildly reduced. There is mildly elevated pulmonary artery systolic pressure. The tricuspid regurgitant velocity is 3.23 m/s, and with an assumed right atrial pressure of 3 mmHg, the estimated right ventricular systolic pressure is 44.7 mmHg. Tricuspid Valve: Tricuspid valve regurgitation is mild to moderate. No evidence of tricuspid stenosis. Venous: The inferior vena cava is normal in  size with greater than 50% respiratory variability, suggesting right atrial pressure of 3 mmHg. LEFT VENTRICLE PLAX 2D LVIDd:         6.20 cm LVIDs:         5.80 cm LV PW:         0.80 cm LV IVS:        0.90 cm  RIGHT VENTRICLE RV Basal diam:  4.15 cm LEFT ATRIUM         Index LA diam:    4.90 cm 2.95 cm/m  TRICUSPID VALVE TR Peak grad:   41.7 mmHg TR Vmax:        323.00 cm/s Jodelle Red MD Electronically signed by Jodelle Red MD Signature Date/Time: 08/07/2023/1:46:34 PM    Final      Medications:     Scheduled Medications:  apixaban  5 mg Oral BID   Chlorhexidine Gluconate Cloth  6 each Topical Daily   insulin aspart  0-9 Units Subcutaneous Q4H   nicotine  21 mg Transdermal Daily   sodium chloride flush  10 mL Intravenous Q12H   sodium chloride flush  10 mL Intracatheter Q12H   sodium chloride flush  10-40 mL Intracatheter Q12H   [START ON 08/11/2023] thiamine (VITAMIN B1) injection  100 mg Intravenous Q24H    Infusions:  amiodarone 60 mg/hr (08/08/23 0600)   milrinone 0.375 mcg/kg/min (08/08/23 0600)   norepinephrine (LEVOPHED) Adult infusion 3 mcg/min (08/08/23 0600)   piperacillin-tazobactam (ZOSYN)  IV 12.5 mL/hr at 08/08/23 0600   potassium chloride 10 mEq (08/08/23 0641)   thiamine (VITAMIN B1) injection      PRN Medications: acetaminophen, docusate sodium, HYDROmorphone (DILAUDID) injection, polyethylene glycol, sodium chloride flush    Patient Profile   77 y.o. male with chronic systolic CHF/ICM s/p CRT-D, CAD s/p CABG, severe MR, PAD, PAF, PVCs. Admitted with recurrent Afib with RVR c/b cardiogenic shock.   Assessment/Plan  1. Acute on chronic systolic CHF -> cardiogenic shock: Ischemic cardiomyopathy.  St Jude CRT-D device. TEE in 7/24 showing EF 30-35%, severe MR with restricted posterior leaflet and small flail segment in posterior leaflet, RV normal, mild AI.  RHC 7/24 with elevated filling pressures and low cardiac output. Echo 07/29/23 EF  35-40% RV moderately reduced severe MR Patient is quite frail, NYHA class IIIb symptoms.   - He is now in profound cardiogenic shock in setting of recurrent AF - Limited echo 11/17: EF 25-30%, RV mildly reduced - CO-OX 52% on 0.375 milrinone + 3 NE. Increase NE to 5 and repeat CO-OX in an hr.  - CVP 6. Will hold on additional IV lasix for now. Check CVP this afternoon. - May attempt DCCV tomorrow once he is more stable. - HF may be too advanced for mTEER. - Given baseline functional status and fraility he is not candidate for advanced therapies and severe PAD precludes any form of temporary mechanical support - Palliative Care consulted for GOC discussions.    2. CAD: s/p CABG in 2014. Cath in 7/24 showed patent grafts.   - No s/s angina  - Resume home aspirin and statin  3. Mitral regurgitation: TEE (7/24) with severe MR with restricted posterior leaflet and small flail segment in posterior leaflet.  Suspect primarily infarct-related MR.  I suspect MR is playing a significant role in his symptomatology.  - Dr. Gala Romney discussed with structural team. Suspect he is too far along to receive real benefit from mTEER. Holding off on this for now.   4. Atrial fibrillation, paroxysmal   Has failed amiodarone (lung toxicity) as well as sotalol (break through).  Currently, not on an antiarrhythmic but in NSR.  - He is back in AF now with severe shock. Amio is only option. Continue IV amio  - Continue apixaban 5 mg bid. (He missed one dose PTA). - Consider DC-CV  once more stable, possible tomorrow. - Supp K  and Mag - Doubt needs TEE with missing just one dose as metabolism of apixban was likely slowed with shock liver kidney   5. Shock liver - continue supportive care - Follow   6. AKI - due to shock Scr 1.1 -> 1.4, improved to 1.2 today - supportive care. Follow   7. PVCs: Frequent.  Continue mexiletine.    8. PAD: S/p aortobifemoral bypass 2014 then SFA stent 2022.  Followed by Dr.  Chestine Spore.  Had left foot ulcerations that have healed.     Length of Stay: 2  Samaria Anes N, PA-C  08/08/2023, 7:14 AM  Advanced Heart Failure Team Pager [phone] (M-F; 7a - 5p)  Please contact [facility] for night-coverage after hours (5p -7a ) and weekends on [email or web address]

## 2023-08-08 NOTE — TOC Progression Note (Addendum)
Transition of Care [facility]) - Progression Note    Patient Details  Name: [name] [name] MRN: [id number] Date of Birth: [birth year]  Transition of Care [facility]) CM/SW Contact  Elliot Cousin, RN Phone Number: [phone] 08/08/2023, 4:02 PM  Clinical Narrative:    [facility] Central[facility] notified, reference number # [id number].  Pt active with [facility].  Spoke to pt and wife at bedside. States pt has scale, motorized scooter, RW, and cane at home. Has HHRN and HH PT. Will need resumption of care orders at dc.     Expected Discharge Plan: Home w Home Health Services Barriers to Discharge: Continued Medical Work up  Expected Discharge Plan and Services   Discharge Planning Services: CM Consult          Social Determinants of Health (SDOH) Interventions SDOH Screenings   Food Insecurity: No Food Insecurity (08/08/2023)  Housing: Low Risk  (08/08/2023)  Transportation Needs: No Transportation Needs (08/08/2023)  Utilities: Not At Risk (08/08/2023)  Tobacco Use: High Risk (08/06/2023)    Readmission Risk Interventions     No data to display

## 2023-08-08 NOTE — Progress Notes (Signed)
No ICM remote transmission received for 08/08/2023 due to currently hospitalized and next ICM transmission scheduled for 08/29/2023.

## 2023-08-08 NOTE — Progress Notes (Signed)
   Palliative Medicine Inpatient Follow Up Note HPI: 77 year old man w/ hx of CAD post CABG, ischemic cardiomyopathy s/p CRT-D, DM, severe MVR, PADs/p aortobifemoral bypass, Afib presenting with increasing fatigue, weakness, anorexia, N/V, abdominal cramping and FTT. Palliative care consulted for goals of care conversations in the setting of end stage disease.   Today's Discussion 08/08/2023  *Please note that this is a verbal dictation therefore any spelling or grammatical errors are due to the "Dragon Medical One" system interpretation.  Chart reviewed inclusive of vital signs, progress notes, laboratory results, and diagnostic images.   I met with [name] [name] at bedside this morning. He was in the company of his daughter, [name] [name]. She had stayed the night,  Created space and opportunity for patient to explore thoughts feelings and fears regarding current medical situation. [name] [name] shares that he feels "much better" today. He denies pain, shortness of breath, and nausea.   We reviewed the conversation(s) from the day prior. Patient maintains the desire to improve. He shares that he feels hungry today. He is aware of the plan for NGT placement.  Discussed ongoing plan for possible cardioversion tomorrow, which patient is agreeable towards.  Discussed the importance of ongoing conversations related to cardiopulmonary resuscitation status. Reviewed the importance of MOST form complete to help with future conversations.   Patients daughter shares patient has not wanted then to bring in his living will in the event that he "changes his mind".   Questions and concerns addressed/Palliative Support Provided.   Objective Assessment: Vital Signs Vitals:   08/08/23 0815 08/08/23 1124  BP:    Pulse:    Resp:    Temp: 98.3 F (36.8 C) 98 F (36.7 C)  SpO2:      Intake/Output Summary (Last 24 hours) at 08/08/2023 1158 Last data filed at 08/08/2023 4782 Gross per 24 hour  Intake 956.62 ml   Output 2130 ml  Net -1173.38 ml   Last Weight  Most recent update: 08/08/2023  6:39 AM    Weight  57.3 kg (126 lb 5.2 oz)            Gen: Extremely frail elderly Caucasian male chronically ill in appearance HEENT: moist mucous membranes CV: Regular rate and irregular rhythm  PULM: On RA, breathing is even and nonlabored ABD: soft/nontender  EXT: LE edema Neuro: Alert and oriented x3 -hard of hearing  SUMMARY OF RECOMMENDATIONS   FULL CODE --> ongoing conversations. Introduced MOST form   Coretrack placement today  Possible cardioversion tomorrow   Allowing time for outcomes   The palliative care team will continue to follow and offer support  Billing based on MDM: High ______________________________________________________________________________________ [name] [name] [name] [name] Palliative Medicine Team Team Cell Phone: [phone] Please utilize secure chat with additional questions, if there is no response within 30 minutes please call the above phone number  Palliative Medicine Team providers are available by phone from 7am to 7pm daily and can be reached through the team cell phone.  Should this patient require assistance outside of these hours, please call the patient's attending physician.

## 2023-08-08 NOTE — Consult Note (Addendum)
WOC Nurse Consult Note: Reason for Consult: Requested a assess of multiple wounds on feet, callus on R ankle Wound type: possible related for poor blood flow, ischemia? Measurement: RIA .5X.5X.1 Toe: 0.5x.6 (Callus) Dressing procedure/placement/frequency: Bilateral heels: Foam dressing change each 3 days. The Callus it should continue dry (open air)  Nicholad Kautzman Barnabas Harries BSN, RN, WOC nurse  Cammie Mcgee WOC nurse

## 2023-08-08 NOTE — Procedures (Signed)
Cortrak  Person Inserting Tube:  Maylon Peppers C, RD Tube Type:  Cortrak - 43 inches Tube Size:  10 Tube Location:  Left nare Secured by: Bridle Technique Used to Measure Tube Placement:  Marking at nare/corner of mouth Cortrak Secured At:  74 cm   Cortrak Tube Team Note:  Consult received to place a Cortrak feeding tube.   X-ray is required, abdominal x-ray has been ordered by the Cortrak team. Please confirm tube placement before using the Cortrak tube.   If the tube becomes dislodged please keep the tube and contact the Cortrak team at [email or web address] for replacement.  If after hours and replacement cannot be delayed, place a NG tube and confirm placement with an abdominal x-ray.    Lockie Pares., RD, LDN, CNSC See AMiON for contact information

## 2023-08-08 NOTE — Progress Notes (Signed)
eLink Physician-Brief Progress Note Patient Name: [name] [name] DOB: 07/13/1946 MRN: [id number]   Date of Service  08/08/2023  HPI/Events of Note  77 year old man w/ hx of CAD post CABG, ischemic cardiomyopathy s/p CRT-D, DM, severe MVR, PADs/p aortobifemoral bypass, Afib presenting with increasing fatigue, weakness, anorexia, N/V, abdominal cramping and FTT.  Pacemaker placement in the morning.  Asking for something for rest.  Takes Benadryl at home.  eICU Interventions  Trazodone as needed     Intervention Category Minor Interventions: Routine modifications to care plan (e.g. PRN medications for pain, fever)  [name] [name] 08/08/2023, 11:06 PM

## 2023-08-08 NOTE — Progress Notes (Signed)
NAME:  ZAIR CASSERLY, MRN:  [id number], DOB:  [birth year], LOS: 2 ADMISSION DATE:  08/06/2023, CONSULTATION DATE:  08/06/23 REFERRING MD:  EDP, CHIEF COMPLAINT:  fatigue   History of Present Illness:  77 year old man w/ hx of CAD post CABG, ischemic cardiomyopathy s/p CRT-D, DM, severe MVR, PADs/p aortobifemoral bypass, Afib presenting with increasing fatigue, weakness, anorexia, N/V, abdominal cramping and FTT.  Workup has revealed acute liver injury, lactic acidosis.  Cardiology is consulted and PCCM to admit.  Pertinent  Medical History   Past Medical History:  Diagnosis Date   Acid reflux    AICD (automatic cardioverter/defibrillator) present    a. 08/2016 St. Jude (serial Number [id number]) biventricular ICD.   Anemia    Chronic systolic CHF (congestive heart failure) (HCC)    a. 08/2016 Echo: EF 20%, diffuse HK, inf, post AK, mod-sev MR, sev dil LA, mod TR, PASP .   COPD (chronic obstructive pulmonary disease) (HCC)    Coronary artery disease involving native coronary artery of native heart with angina pectoris (HCC)    a. 10/2012 MI after aortobifem bypass, found to have multivessel CAD -->CABG x3;  b. 08/2016 Cath: LM 60, LAD 68m, LCX 60ost/p, RCA 100p, VG->RPDA 50p, VG->OM1 ok, LIMA->LAD ok, EF 25%.   Essential hypertension 03/03/2012   History of blood transfusion 11/2012   "during his CABG"   History of gout X 1   History of stomach ulcers 1970s   Hyperlipidemia associated with type 2 diabetes mellitus (HCC) 09/16/2011   Ischemic cardiomyopathy    a. 08/2016 Echo: EF 20%, diffuse HK, inf, post AK;  b. 08/2016 St. Jude (serial Number [id number]) biventricular ICD.   Myocardial infarction [facility])    PAD (peripheral artery disease) (HCC)    s/p aortobifemoral bypass 10/2012   Peripheral neuropathy    PTSD (post-traumatic stress disorder)    Type II diabetes mellitus (HCC)    Ventricular tachycardia (HCC)    a. 08/2016 St. Jude (serial Number [id number]) biventricular ICD-->oral  amio added.   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 Is now on chronic suppressive antibiotics with dicloxacillin   Significant Hospital Events: Including procedures, antibiotic start and stop dates in addition to other pertinent events   11/16 admit  Interim History / Subjective:  Still smoking, reports he is motivated to quit. Hungry today. Remains on milrinone 0.336mcg , amiodarone, NE .   Objective   Blood pressure (!) 100/57, pulse 90, temperature 98.3 F (36.8 C), temperature source Axillary, resp. rate 18, height 5\' 6"  (1.676 m), weight 57.3 kg, SpO2 98%. CVP:  [2 mmHg-17 mmHg] 5 mmHg      Intake/Output Summary (Last 24 hours) at 08/08/2023 0936 Last data filed at 08/08/2023 0917 Gross per 24 hour  Intake 1160.71 ml  Output 2130 ml  Net -969.29 ml   Filed Weights   08/06/23 1405 08/07/23 0600 08/08/23 0639  Weight: 57.6 kg 58.7 kg 57.3 kg    Examination: General: chronically ill appearing man lying in bed in NAD HENT: [place]/AT, eyes anicteric Lungs: faint basilar rhales, no tachypnea. Able to lie flat comfortably.  Cardiovascular: irreg rhythm, min tachycardia in low 100s. Afib on tele.  Abdomen: soft, NT Extremities: minimal muscle mass, no cyanosis Neuro: awake, alert, answering questions appropriately. Moving extremities but globally weak.  Blood culture: NGTD  Coox 52% Na+ 126 K+ 3.0 Mg + 1.9 BUN 39 Cr  1.26 AST 1586 ALT 1775 T bili 0.9 WBC 10.1 H/H 9.1/28.6 Platelets 186  Echo: LVEF 25-30%;    Resolved problem list:     Assessment & Plan:   Cardiogenic shock  Acute on chronic HFrEF due to ischemic cardiomyopathy complicated by shock liver, evolving AKI and encephalopathy CAD AICD + synchronous pacing wires in place Severe MR -appreciate AHF's management -NE increased due to low coox this morning; con't  milrinone and NE -hold Bblocker; other GDMT due to need for NE -con't empiric antibiotics given encephalopathy at presentation. Wait until blood cultures have had time to mature.   Persistent Afib/RVR on AC PTA- failed recent cardioversion in 06/2023. RVR may have contributed to decompensation then. Was back in Afib in clinic on 11/14-- was referred to ED but didn't go. Previously failed sotalol (didn't work) and amiodarone due to lung toxicity-- diagnosed by PFTs and symptoms alone: not sure we can call this true toxicity -cardioversion per heart failure, potentially tomorrow -amiodarone for rescue Afib control; monitor lungs closely -on eliquis PTA  Severe MVR- was scheduled for mitral clip in Nov 2024, then plan for mitral transcatheter edge to edge repair in 12/24 with Dr. Excell Seltzer -postponed for now due to decompensated state -prevent high afterload -not an open valve replacement candidate due to debility, advanced heart failure, previous sternal infection- not sure if resected.  Pancolitis on CT- likely ischemic -zosyn  Hypokalemia -replete, monitor -replete magnesium as well  PAD; previous aorto- bifem bypass -aspirin, statin  DM2 -SSI PRN -goal BG 140-180 -once TF start, add aspart 2 units Q4h  -hold PTA metformin, januvia  COPD Ongoing tobacco abuse -recommended cessation; nicotine replacement therapy -not on PTA inhalers; duonebs PRN here  PUD Iron deficiency anemia of unknown source.  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  DVT prophylaxis: apixaban GI prophylaxis: N/A Lines: Central line Foley:  N/A Code Status:  full code Last date of multidisciplinary goals of care discussion [08/06/23]   This patient is critically ill with multiple organ system failure which  requires frequent high complexity decision making, assessment, support, evaluation, and titration of therapies. This was completed through the application of advanced monitoring technologies and extensive interpretation of multiple databases. During this encounter critical care time was devoted to patient care services described in this note for 40 minutes.  Steffanie Dunn, DO 08/08/23 9:55 AM [facility]  For contact information, see Amion. If no response to pager, please call PCCM consult pager. After hours, 7PM- 7AM, please call Elink.

## 2023-08-08 NOTE — Progress Notes (Signed)
Heart Failure Navigator Progress Note  Assessed for Heart & Vascular TOC clinic readiness.  Patient does not meet criteria due to Advanced Heart Failure Team patient of Dr. McLean.   Navigator will sign off at this time.   Mahreen Schewe, BSN, RN Heart Failure Nurse Navigator Secure Chat Only   

## 2023-08-09 ENCOUNTER — Ambulatory Visit (HOSPITAL_COMMUNITY)
Admission: RE | Admit: 2023-08-09 | Payer: No Typology Code available for payment source | Source: Home / Self Care | Admitting: Cardiology

## 2023-08-09 ENCOUNTER — Encounter (HOSPITAL_COMMUNITY)
Admission: EM | Disposition: A | Discharge status: Hospice | Payer: Self-pay | Source: Home / Self Care | Attending: Critical Care Medicine

## 2023-08-09 DIAGNOSIS — Z7189 Other specified counseling: Secondary | ICD-10-CM | POA: Diagnosis not present

## 2023-08-09 DIAGNOSIS — I5023 Acute on chronic systolic (congestive) heart failure: Secondary | ICD-10-CM | POA: Diagnosis not present

## 2023-08-09 DIAGNOSIS — R570 Cardiogenic shock: Secondary | ICD-10-CM | POA: Diagnosis not present

## 2023-08-09 DIAGNOSIS — Z515 Encounter for palliative care: Secondary | ICD-10-CM | POA: Diagnosis not present

## 2023-08-09 LAB — CBC
HCT: 28.2 % — ABNORMAL LOW (ref 39.0–52.0)
Hemoglobin: 9.2 g/dL — ABNORMAL LOW (ref 13.0–17.0)
MCH: 24.3 pg — ABNORMAL LOW (ref 26.0–34.0)
MCHC: 32.6 g/dL (ref 30.0–36.0)
MCV: 74.4 fL — ABNORMAL LOW (ref 80.0–100.0)
Platelets: 183 10*3/uL (ref 150–400)
RBC: 3.79 MIL/uL — ABNORMAL LOW (ref 4.22–5.81)
RDW: 17.1 % — ABNORMAL HIGH (ref 11.5–15.5)
WBC: 9.2 10*3/uL (ref 4.0–10.5)
nRBC: 0.5 % — ABNORMAL HIGH (ref 0.0–0.2)

## 2023-08-09 LAB — IRON AND TIBC
Iron: 14 ug/dL — ABNORMAL LOW (ref 45–182)
Saturation Ratios: 4 % — ABNORMAL LOW (ref 17.9–39.5)
TIBC: 374 ug/dL (ref 250–450)
UIBC: 360 ug/dL

## 2023-08-09 LAB — BASIC METABOLIC PANEL
Anion gap: 9 (ref 5–15)
Anion gap: 10 (ref 5–15)
BUN: 29 mg/dL — ABNORMAL HIGH (ref 8–23)
BUN: 25 mg/dL — ABNORMAL HIGH (ref 8–23)
CO2: 30 mmol/L (ref 22–32)
CO2: 28 mmol/L (ref 22–32)
Calcium: 8.3 mg/dL — ABNORMAL LOW (ref 8.9–10.3)
Calcium: 8 mg/dL — ABNORMAL LOW (ref 8.9–10.3)
Chloride: 87 mmol/L — ABNORMAL LOW (ref 98–111)
Chloride: 87 mmol/L — ABNORMAL LOW (ref 98–111)
Creatinine, Ser: 1 mg/dL (ref 0.61–1.24)
Creatinine, Ser: 0.9 mg/dL (ref 0.61–1.24)
GFR, Estimated: >60 mL/min (ref >60)
GFR, Estimated: >60 mL/min (ref >60)
Glucose, Bld: 137 mg/dL — ABNORMAL HIGH (ref 70–99)
Glucose, Bld: 359 mg/dL — ABNORMAL HIGH (ref 70–99)
Potassium: 3.2 mmol/L — ABNORMAL LOW (ref 3.5–5.1)
Potassium: 3.3 mmol/L — ABNORMAL LOW (ref 3.5–5.1)
Sodium: 126 mmol/L — ABNORMAL LOW (ref 135–145)
Sodium: 125 mmol/L — ABNORMAL LOW (ref 135–145)

## 2023-08-09 LAB — HEPATIC FUNCTION PANEL
ALT: 1487 U/L — ABNORMAL HIGH (ref 0–44)
AST: 884 U/L — ABNORMAL HIGH (ref 15–41)
Albumin: 3 g/dL — ABNORMAL LOW (ref 3.5–5.0)
Alkaline Phosphatase: 78 U/L (ref 38–126)
Bilirubin, Direct: 0.3 mg/dL — ABNORMAL HIGH (ref 0.0–0.2)
Indirect Bilirubin: 0.4 mg/dL (ref 0.3–0.9)
Total Bilirubin: 0.7 mg/dL (ref <1.2)
Total Protein: 5.5 g/dL — ABNORMAL LOW (ref 6.5–8.1)

## 2023-08-09 LAB — COOXEMETRY PANEL
Carboxyhemoglobin: 1.7 % — ABNORMAL HIGH (ref 0.5–1.5)
Methemoglobin: <0.7 % (ref 0.0–1.5)
O2 Saturation: 51.8 %
Total hemoglobin: 9.6 g/dL — ABNORMAL LOW (ref 12.0–16.0)

## 2023-08-09 LAB — GLUCOSE, CAPILLARY
Glucose-Capillary: 121 mg/dL — ABNORMAL HIGH (ref 70–99)
Glucose-Capillary: 195 mg/dL — ABNORMAL HIGH (ref 70–99)
Glucose-Capillary: 245 mg/dL — ABNORMAL HIGH (ref 70–99)
Glucose-Capillary: 264 mg/dL — ABNORMAL HIGH (ref 70–99)
Glucose-Capillary: 315 mg/dL — ABNORMAL HIGH (ref 70–99)
Glucose-Capillary: 366 mg/dL — ABNORMAL HIGH (ref 70–99)
Glucose-Capillary: 370 mg/dL — ABNORMAL HIGH (ref 70–99)

## 2023-08-09 LAB — RETICULOCYTES
Immature Retic Fract: 23.9 % — ABNORMAL HIGH (ref 2.3–15.9)
RBC.: 3.8 MIL/uL — ABNORMAL LOW (ref 4.22–5.81)
Retic Count, Absolute: 79.8 10*3/uL (ref 19.0–186.0)
Retic Ct Pct: 2.1 % (ref 0.4–3.1)

## 2023-08-09 LAB — MAGNESIUM
Magnesium: 1.9 mg/dL (ref 1.7–2.4)
Magnesium: 2.3 mg/dL (ref 1.7–2.4)

## 2023-08-09 LAB — PHOSPHORUS
Phosphorus: 2.9 mg/dL (ref 2.5–4.6)
Phosphorus: 2.3 mg/dL — ABNORMAL LOW (ref 2.5–4.6)

## 2023-08-09 LAB — FERRITIN: Ferritin: 73 ng/mL (ref 24–336)

## 2023-08-09 LAB — VITAMIN B12: Vitamin B-12: 1948 pg/mL — ABNORMAL HIGH (ref 180–914)

## 2023-08-09 LAB — FOLATE: Folate: 14.1 ng/mL (ref >5.9)

## 2023-08-09 SURGERY — CARDIOVERSION (CATH LAB)
Anesthesia: Monitor Anesthesia Care

## 2023-08-09 MED ORDER — SODIUM CHLORIDE 0.9 % IV SOLN
500.0000 mg | Freq: Once | INTRAVENOUS | Status: DC
  Filled 2023-08-09: qty 25

## 2023-08-09 MED ORDER — PANTOPRAZOLE SODIUM 40 MG IV SOLR
40.0000 mg | INTRAVENOUS | Status: DC
  Administered 2023-08-09 – 2023-08-12 (×4): 40 mg via INTRAVENOUS
  Filled 2023-08-09 (×4): qty 10

## 2023-08-09 MED ORDER — MAGNESIUM SULFATE 2 GM/50ML IV SOLN
INTRAVENOUS | Status: AC
  Administered 2023-08-09: 2 g via INTRAVENOUS
  Filled 2023-08-09: qty 50

## 2023-08-09 MED ORDER — POTASSIUM CHLORIDE 20 MEQ PO PACK
40.0000 meq | PACK | Freq: Once | ORAL | Status: AC
  Administered 2023-08-09: 40 meq
  Filled 2023-08-09: qty 2

## 2023-08-09 MED ORDER — POTASSIUM CHLORIDE 10 MEQ/50ML IV SOLN
10.0000 meq | INTRAVENOUS | Status: AC
  Administered 2023-08-09 (×6): 10 meq via INTRAVENOUS
  Filled 2023-08-09 (×6): qty 50

## 2023-08-09 MED ORDER — MAGNESIUM SULFATE 2 GM/50ML IV SOLN
2.0000 g | Freq: Once | INTRAVENOUS | Status: AC
  Administered 2023-08-09: 2 g via INTRAVENOUS
  Filled 2023-08-09: qty 50

## 2023-08-09 MED ORDER — MAGNESIUM SULFATE 2 GM/50ML IV SOLN
2.0000 g | Freq: Once | INTRAVENOUS | Status: AC
  Filled 2023-08-09: qty 50

## 2023-08-09 MED ORDER — IRON SUCROSE 500 MG IVPB - SIMPLE MED
500.0000 mg | Freq: Once | INTRAVENOUS | Status: DC
  Filled 2023-08-09: qty 275

## 2023-08-09 MED ORDER — FUROSEMIDE 10 MG/ML IJ SOLN
80.0000 mg | Freq: Two times a day (BID) | INTRAMUSCULAR | Status: DC
  Administered 2023-08-09 – 2023-08-10 (×3): 80 mg via INTRAVENOUS
  Filled 2023-08-09 (×3): qty 8

## 2023-08-09 MED ORDER — INSULIN ASPART 100 UNIT/ML IJ SOLN
3.0000 [IU] | INTRAMUSCULAR | Status: DC
  Administered 2023-08-09 – 2023-08-11 (×8): 3 [IU] via SUBCUTANEOUS

## 2023-08-09 MED ORDER — POTASSIUM PHOSPHATES 15 MMOLE/5ML IV SOLN
15.0000 mmol | Freq: Once | INTRAVENOUS | Status: AC
  Administered 2023-08-09: 15 mmol via INTRAVENOUS
  Filled 2023-08-09: qty 5

## 2023-08-09 MED ORDER — POTASSIUM CHLORIDE 20 MEQ PO PACK
40.0000 meq | PACK | ORAL | Status: AC
  Administered 2023-08-09 (×2): 40 meq
  Filled 2023-08-09 (×2): qty 2

## 2023-08-09 MED ORDER — LIVING WELL WITH DIABETES BOOK
Freq: Once | Status: AC
  Filled 2023-08-09: qty 1

## 2023-08-09 MED ORDER — ACETAZOLAMIDE 250 MG PO TABS
500.0000 mg | ORAL_TABLET | Freq: Once | ORAL | Status: AC
  Administered 2023-08-09: 500 mg via ORAL
  Filled 2023-08-09: qty 2

## 2023-08-09 MED ORDER — SODIUM CHLORIDE 0.9 % IV SOLN
500.0000 mg | Freq: Once | INTRAVENOUS | Status: AC
  Administered 2023-08-09: 500 mg via INTRAVENOUS
  Filled 2023-08-09: qty 25

## 2023-08-09 NOTE — Progress Notes (Signed)
NAME:  [name] [name], MRN:  [id number], DOB:  [birth year], LOS: 3 ADMISSION DATE:  08/06/2023, CONSULTATION DATE:  08/06/23 REFERRING MD:  EDP, CHIEF COMPLAINT:  fatigue   History of Present Illness:  77 year old man w/ hx of CAD post CABG, ischemic cardiomyopathy s/p CRT-D, DM, severe MVR, PADs/p aortobifemoral bypass, Afib presenting with increasing fatigue, weakness, anorexia, N/V, abdominal cramping and FTT.  Workup has revealed acute liver injury, lactic acidosis.  Cardiology is consulted and PCCM to admit.  Pertinent  Medical History   Past Medical History:  Diagnosis Date   Acid reflux    AICD (automatic cardioverter/defibrillator) present    a. 08/2016 St. Jude (serial Number [id number]) biventricular ICD.   Anemia    Chronic systolic CHF (congestive heart failure) (HCC)    a. 08/2016 Echo: EF 20%, diffuse HK, inf, post AK, mod-sev MR, sev dil LA, mod TR, PASP .   COPD (chronic obstructive pulmonary disease) (HCC)    Coronary artery disease involving native coronary artery of native heart with angina pectoris (HCC)    a. 10/2012 MI after aortobifem bypass, found to have multivessel CAD -->CABG x3;  b. 08/2016 Cath: LM 60, LAD 42m, LCX 60ost/p, RCA 100p, VG->RPDA 50p, VG->OM1 ok, LIMA->LAD ok, EF 25%.   Essential hypertension 03/03/2012   History of blood transfusion 11/2012   "during his CABG"   History of gout X 1   History of stomach ulcers 1970s   Hyperlipidemia associated with type 2 diabetes mellitus (HCC) 09/16/2011   Ischemic cardiomyopathy    a. 08/2016 Echo: EF 20%, diffuse HK, inf, post AK;  b. 08/2016 St. Jude (serial Number [id number]) biventricular ICD.   Myocardial infarction [facility])    PAD (peripheral artery disease) (HCC)    s/p aortobifemoral bypass 10/2012   Peripheral neuropathy    PTSD (post-traumatic stress disorder)    Type II diabetes mellitus (HCC)    Ventricular tachycardia (HCC)    a. 08/2016 St. Jude (serial Number [id number]) biventricular ICD-->oral  amio added.   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 Is now on chronic suppressive antibiotics with dicloxacillin   Significant Hospital Events: Including procedures, antibiotic start and stop dates in addition to other pertinent events   11/16 admit  Interim History / Subjective:  Feels well today. No CP, breathing is stable.   Objective   Blood pressure 113/78, pulse 97, temperature 98.5 F (36.9 C), temperature source Oral, resp. rate 18, height 5\' 6"  (1.676 m), weight 57.9 kg, SpO2 (!) 83%. CVP:  [6 mmHg-25 mmHg] 16 mmHg      Intake/Output Summary (Last 24 hours) at 08/09/2023 1510 Last data filed at 08/09/2023 1500 Gross per 24 hour  Intake 2632.01 ml  Output 5050 ml  Net -2417.99 ml   Filed Weights   08/07/23 0600 08/08/23 0639 08/09/23 0600  Weight: 58.7 kg 57.3 kg 57.9 kg    Examination: General: chronically ill appearing man lying in bed in NAD HENT: [place]/AT, eyes anicteric Lungs: no wheezing, minimal rhales. No conversational dyspnea.  Cardiovascular: S1S2, irreg rhyhm, reg rate.  Abdomen: soft, NT Extremities: Minimal muscle mass, no cyanosis.  Neuro: awake, more interactive today, answering questions appropriately and moving all extremities  Blood culture: NGTD  Coox 52% Na+ 126 K+ 3.2 Mg + 1.9 BUN 29 Cr 1.00 AST 884 ALT 1487 T bili 0.7 WBC 10.1 H/H 9.1/28.6 Platelets 186  Iron %  saturation 4, iron 14,  ferritin 73   Resolved problem list:     Assessment & Plan:   Cardiogenic shock  Acute on chronic HFrEF due to ischemic cardiomyopathy complicated by shock liver, evolving AKI and encephalopathy CAD AICD + synchronous pacing wires in place Severe MR -appreciate AHF's management -likely cardioversion tomorrow -con't diuresis today- lasix & diamox -con't milrinone & NE; serial coox -hold Bblocker while on  inotropes; rest of GDMT held due to AKI and hypotension -1 week of zosyn for risk of translocation from colitis  Persistent Afib/RVR on AC PTA- failed recent cardioversion in 06/2023. RVR may have contributed to decompensation then. Was back in Afib in clinic on 11/14-- was referred to ED but didn't go. Previously failed sotalol (didn't work) and amiodarone due to lung toxicity-- diagnosed by PFTs and symptoms alone: not sure we can call this true toxicity -cardioversion under conscious sedation tomorrow -Con't amiodarone; would rechallenge as an OP due to unclear evidence of he had lung toxicity. Concern is uncontrolled Afib for him is likely higher risk than amiodarone toxicity.  -con't PTA eliquis  Severe MVR- was scheduled for mitral clip in Nov 2024, then plan for mitral transcatheter edge to edge repair in 12/24 with Dr. Excell Seltzer -postponed intervention due to decompensated state -not an open valve replacement candidate due to debility, advanced heart failure, previous sternal infection- not sure if resected.  Pancolitis on CT- likely ischemic -7 days of zosyn  Hypokalemia -repletion, mag repletion as well -con't monitoring  PAD; previous aorto- bifem bypass -statin and aspirin daily  DM2 -adding TF coverage 3 units q4h with hold parameters -SSI PRN -gaol BG 140-180 -hold PTA metformin & januvia  COPD Ongoing tobacco abuse -recommend complete tobacco cessation -not on PTA inhalers; duonebs PRN here  H/o PUD Iron deficiency anemia of unknown source> acutely worse. Worried about oozing from ischemic colitis, but not having large melanic bowel movements. Bleeding if in GI tract is not likely brisk.  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   Best Practice (right click and "Reselect all SmartList  Selections" daily)   Diet/type: Regular consistency (see orders) - will start supplemental feeds.  DVT prophylaxis: apixaban GI prophylaxis: N/A Lines: Central line Foley:  N/A Code Status:  DNR Last date of multidisciplinary goals of care discussion [08/06/23]   This patient is critically ill with multiple organ system failure which requires frequent high complexity decision making, assessment, support, evaluation, and titration of therapies. This was completed through the application of advanced monitoring technologies and extensive interpretation of multiple databases. During this encounter critical care time was devoted to patient care services described in this note for 34 minutes.  Steffanie Dunn, DO 08/09/23 3:10 PM [facility]  For contact information, see Amion. If no response to pager, please call PCCM consult pager. After hours, 7PM- 7AM, please call Elink.

## 2023-08-09 NOTE — Evaluation (Signed)
Occupational Therapy Evaluation Patient Details Name: [name] [name] MRN: [id number] DOB: May 07, 1946 Today's Date: 08/09/2023   History of Present Illness Pt is a 77 y.o. male presenting with fatigue, weakness, N/V and FTT. Found to have acute liver injury and lactic acidosis. Multiple recent admissions with heart failure, shock, lactic acid build up in setting of afib with RVR. PMH significant for CAD post CABG, ischemic cardiomyopathy s/p CRT-D, DM, severe MVR, PADs/p aortobifemoral bypass, Afib   Clinical Impression   PTA, pt lived with his wife who assisted with shower transfers, intermittently with LB ADL, and with home making until the past two weeks, she has been assisting with "everything". Upon eval, pt with profound weakness, decreased core control, strength, balance, activity tolerance, and safety. Requiring mod-max A for ADL and transfers. Will continue to follow. Patient will benefit from continued inpatient follow up therapy, <3 hours/day       If plan is discharge home, recommend the following: Two people to help with walking and/or transfers;A lot of help with bathing/dressing/bathroom;Assistance with cooking/housework;Assist for transportation;Help with stairs or ramp for entrance    Functional Status Assessment  Patient has had a recent decline in their functional status and demonstrates the ability to make significant improvements in function in a reasonable and predictable amount of time.  Equipment Recommendations  Other (comment) (defer)    Recommendations for Other Services       Precautions / Restrictions Precautions Precautions: Fall Restrictions Weight Bearing Restrictions: No      Mobility Bed Mobility Overal bed mobility: Needs Assistance Bed Mobility: Sidelying to Sit   Sidelying to sit: Mod assist            Transfers Overall transfer level: Needs assistance Equipment used: Rolling walker (2 wheels) Transfers: Sit to/from Stand, Bed to  chair/wheelchair/BSC Sit to Stand: Mod assist     Step pivot transfers: Mod assist, Max assist, +2 physical assistance, +2 safety/equipment     General transfer comment: Mod A for initial rise and steady as well as initiation of pivotal steps toward chair. Needing incresaed assist to keep RW close to support steps and pt attempting to reach toward chair, needing max A to maintain upright and be redirected back to BUE on RW. Heavy reliance on BUE throughout. Cues for step pattern      Balance Overall balance assessment: Needs assistance Sitting-balance support: Feet unsupported, Bilateral upper extremity supported, No upper extremity supported, Single extremity supported Sitting balance-Leahy Scale: Poor Sitting balance - Comments: up to mod A in absence of BUE support   Standing balance support: Bilateral upper extremity supported, During functional activity Standing balance-Leahy Scale: Poor Standing balance comment: external assist for static standing                           ADL either performed or assessed with clinical judgement   ADL Overall ADL's : Needs assistance/impaired Eating/Feeding: Set up;Sitting Eating/Feeding Details (indicate cue type and reason): supported sitting Grooming: Set up;Sitting Grooming Details (indicate cue type and reason): supported sititng Upper Body Bathing: Sitting;Moderate assistance   Lower Body Bathing: Maximal assistance;Sitting/lateral leans;Sit to/from stand   Upper Body Dressing : Moderate assistance;Sitting Upper Body Dressing Details (indicate cue type and reason): for sitting balance Lower Body Dressing: Maximal assistance;Sitting/lateral leans Lower Body Dressing Details (indicate cue type and reason): for balance/core control while donning socks EOB Toilet Transfer: Moderate assistance;+2 for safety/equipment;Maximal assistance;Rolling walker (2 wheels) Toilet Transfer Details (indicate cue type  and reason): simulated to  recliner with step pivot. Mod A initially needing max A with attempts to reach for chair away from RW. Heavy reliance on BUE during transfers         Functional mobility during ADLs: Moderate assistance;+2 for safety/equipment;Maximal assistance       Vision Baseline Vision/History:  (readers) Ability to See in Adequate Light: 0 Adequate Patient Visual Report: No change from baseline Additional Comments: needs readers to see handouts     Perception Perception: Not tested       Praxis Praxis: Not tested       Pertinent Vitals/Pain Pain Assessment Pain Assessment: Faces Faces Pain Scale: Hurts little more Pain Location: BLE with transfers Pain Descriptors / Indicators: Sore Pain Intervention(s): Limited activity within patient's tolerance, Monitored during session     Extremity/Trunk Assessment Upper Extremity Assessment Upper Extremity Assessment: Generalized weakness;Right hand dominant;RUE deficits/detail;LUE deficits/detail RUE Deficits / Details: decresed sensation along lateral aspect of 2nd digit LUE Deficits / Details: decresed sensation along lateral aspect of 2nd digit   Lower Extremity Assessment Lower Extremity Assessment: Defer to PT evaluation   Cervical / Trunk Assessment Cervical / Trunk Assessment: Other exceptions;Kyphotic Cervical / Trunk Exceptions: poor truncal control/weakness   Communication Communication Communication: No apparent difficulties Cueing Techniques: Verbal cues;Tactile cues   Cognition Arousal: Alert Behavior During Therapy: WFL for tasks assessed/performed Overall Cognitive Status: Impaired/Different from baseline Area of Impairment: Safety/judgement, Awareness, Problem solving, Following commands                       Following Commands: Follows one step commands consistently, Follows one step commands with increased time Safety/Judgement: Decreased awareness of safety, Decreased awareness of deficits Awareness:  Emergent Problem Solving: Slow processing, Requires verbal cues, Requires tactile cues General Comments: multimodal cues to maintain safety during mobility.     General Comments  VSS    Exercises Exercises: Other exercises Other Exercises Other Exercises: printed handout and reviewed with pt and pt return demo BUE shoulder flexion x1- with 3 sec hold, scapular retraction, heel slides, ankle pumps, and hip ab/adduction.   Shoulder Instructions      Home Living Family/patient expects to be discharged to:: Private residence Living Arrangements: Spouse/significant other Available Help at Discharge: Family;Available 24 hours/day Type of Home: House Home Access: Ramped entrance     Home Layout: Able to live on main level with bedroom/bathroom     Bathroom Shower/Tub: Walk-in shower;Tub/shower unit   Bathroom Toilet: Standard     Home Equipment: Rollator (4 wheels);Rolling Walker (2 wheels);Electric scooter;Cane - single point;BSC/3in1;Shower seat;Grab bars - toilet;Hand held shower head (bed rails)          Prior Functioning/Environment Prior Level of Function : Needs assist             Mobility Comments: ambulates short distances in home with RW. Reports frequent falls. Electric scooter in community and on ramp into house. Wife assists with more complex transfer (shower, etc) ADLs Comments: Wife assists as needed, primarily with LB dressing and bathing. Wife performs all home making tasks        OT Problem List: Decreased strength;Decreased activity tolerance;Impaired balance (sitting and/or standing);Decreased cognition;Decreased safety awareness;Decreased knowledge of use of DME or AE      OT Treatment/Interventions: Self-care/ADL training;Therapeutic exercise;DME and/or AE instruction;Balance training;Patient/family education;Therapeutic activities;Energy conservation    OT Goals(Current goals can be found in the care plan section) Acute Rehab OT Goals Patient  Stated Goal: get better  OT Goal Formulation: With patient Time For Goal Achievement: 08/23/23 Potential to Achieve Goals: Good  OT Frequency: Min 1X/week    Co-evaluation              AM-PAC OT "6 Clicks" Daily Activity     Outcome Measure Help from another person eating meals?: A Little Help from another person taking care of personal grooming?: A Little Help from another person toileting, which includes using toliet, bedpan, or urinal?: A Lot Help from another person bathing (including washing, rinsing, drying)?: A Lot Help from another person to put on and taking off regular upper body clothing?: A Lot Help from another person to put on and taking off regular lower body clothing?: A Lot 6 Click Score: 14   End of Session Equipment Utilized During Treatment: Gait belt;Rolling walker (2 wheels) Nurse Communication: Mobility status  Activity Tolerance: Patient tolerated treatment well Patient left: in chair;with call bell/phone within reach;with family/visitor present;with nursing/sitter in room  OT Visit Diagnosis: Unsteadiness on feet (R26.81);Muscle weakness (generalized) (M62.81);History of falling (Z91.81);Other symptoms and signs involving cognitive function;Other (comment);Adult, failure to thrive (R62.7) (decreased activity tolerance)                Time: 2130-8657 OT Time Calculation (min): 38 min Charges:  OT General Charges $OT Visit: 1 Visit OT Evaluation $OT Eval Moderate Complexity: 1 Mod OT Treatments $Self Care/Home Management : 23-37 mins  Tyler Deis, OTR/L [facility] Acute Rehabilitation Office: [phone]   Myrla Halsted 08/09/2023, 10:57 AM

## 2023-08-09 NOTE — Progress Notes (Signed)
Pharmacy Antibiotic Note  [name] [name] is a 77 y.o. male admitted on 08/06/2023 with concern for sepsis.  Pharmacy has been consulted for zosyn dosing.  Patient remains afebrile, no leukocytosis (wbc 9.2). No growth on recent blood cultures. Given concern for ischemic bowel and translocation, will continue Zosyn for 7 days per CCM. End date ordered. Kidney function has improved (Scr 1.00), previous dosing remains appropriate.   Plan: Continue Zosyn 3.375g IV q 8 h (extended 4h infusion) through 11/22 Monitor renal function, clinical progression and LOT  Height: 5\' 6"  (167.6 cm) Weight: 57.9 kg (127 lb 10.3 oz) IBW/kg (Calculated) : 63.8  Temp (24hrs), Avg:98 F (36.7 C), Min:97.4 F (36.3 C), Max:98.6 F (37 C)  Recent Labs  Lab 08/06/23 1419 08/06/23 1456 08/06/23 1556 08/06/23 2206 08/07/23 0418 08/08/23 0356 08/09/23 0331 08/09/23 0800  WBC 10.1  --   --  12.2* 13.0* 10.1  --  9.2  CREATININE 1.34* 1.10  --   --  1.45* 1.26* 1.00  --   LATICACIDVEN  --  7.8* 8.7*  --  3.7*  --   --   --     Estimated Creatinine Clearance: 52.3 mL/min (by C-G formula based on SCr of 1 mg/dL).    Allergies  Allergen Reactions   Lisinopril     Other reaction(s): Cough   Culture Data: 11/16 Blood cx: now growth at 3 days 11/16 MRSA PCR: negative  Antibiotics this Admission: Vancomycin x1 Zosyn 11/16 >>   Ernestene Kiel, PharmD PGY1 Pharmacy Resident  Please check AMION for all [facility] Pharmacy phone numbers After 10:00 PM, call Main Pharmacy [phone] 08/09/2023 11:19 AM

## 2023-08-09 NOTE — Progress Notes (Addendum)
Advanced Heart Failure Rounding Note  PCP-Cardiologist: Bryan Lemma, MD   Subjective:    CO-OX 52% on 0.375 milrinone + 6 NE.  CVP 12. 3.3L UOP + 1 unmeasured void yesterday with 80 mg IV lasix.   Scr improved to 1.0. K 3.2. Mag 1.9.  LFTs trending down.  Hgb gradually trending down last few days and iron stores are low. Denies melena.  No dyspnea or chest pain. Hungry this morning.   Echo, 08/07/23:  EF 25-30%, RV mildly reduced   Objective:   Weight Range: 57.9 kg Body mass index is 20.6 kg/m.   Vital Signs:   Temp:  [97.4 F (36.3 C)-98.6 F (37 C)] 98.3 F (36.8 C) (11/19 0339) Pulse Rate:  [75-130] 90 (11/19 0700) Resp:  [10-25] 21 (11/19 0700) BP: (86-129)/(45-111) 106/48 (11/19 0700) SpO2:  [62 %-100 %] 98 % (11/19 0700) Weight:  [57.9 kg] 57.9 kg (11/19 0600) Last BM Date : 08/08/23  Weight change: Filed Weights   08/07/23 0600 08/08/23 0639 08/09/23 0600  Weight: 58.7 kg 57.3 kg 57.9 kg    Intake/Output:   Intake/Output Summary (Last 24 hours) at 08/09/2023 0706 Last data filed at 08/09/2023 0700 Gross per 24 hour  Intake 2310.31 ml  Output 3300 ml  Net -989.69 ml      Physical Exam  General:  Cachectic elderly male. HEENT: + CorTrak Neck: supple.  JVP 12-14. R internal jugular  CVC Cor: PMI nondisplaced. Irregular rhythm. No rubs, gallops, 3/6 AS murmur. Lungs: clear Abdomen: soft, nontender, nondistended.  Extremities: no cyanosis, clubbing, rash, edema, atrophic nail changes Neuro: alert & orientedx3. Affect pleasant   Telemetry   Afib 90s   Labs    CBC Recent Labs    08/06/23 1419 08/06/23 1456 08/07/23 0418 08/08/23 0356  WBC 10.1   < > 13.0* 10.1  NEUTROABS 8.3*  --   --   --   HGB 9.8*   < > 9.7* 9.1*  HCT 32.1*   < > 31.0* 28.6*  MCV 78.7*   < > 75.8* 74.1*  PLT 232   < > 222 186   < > = values in this interval not displayed.   Basic Metabolic Panel Recent Labs    78/29/56 0418 08/08/23 0356  08/09/23 0331  NA 124* 126* 126*  K 4.6 3.0* 3.2*  CL 90* 87* 87*  CO2 21* 28 30  GLUCOSE 89 139* 137*  BUN 56* 39* 29*  CREATININE 1.45* 1.26* 1.00  CALCIUM 8.4* 8.1* 8.3*  MG 2.3 1.9 1.9  PHOS 5.1*  --  2.9   Liver Function Tests Recent Labs    08/08/23 0356 08/09/23 0331  AST 1,586* 884*  ALT 1,775* 1,487*  ALKPHOS 87 78  BILITOT 0.9 0.7  PROT 5.3* 5.5*  ALBUMIN 3.0* 3.0*   Recent Labs    08/06/23 1419  LIPASE 34   Cardiac Enzymes No results for input(s): "CKTOTAL", "CKMB", "CKMBINDEX", "TROPONINI" in the last 72 hours.  BNP: BNP (last 3 results) Recent Labs    06/30/23 0947 07/14/23 1505 08/06/23 1419  BNP 2,925.9* 1,631.8* 2,725.8*    ProBNP (last 3 results) No results for input(s): "PROBNP" in the last 8760 hours.   D-Dimer No results for input(s): "DDIMER" in the last 72 hours. Hemoglobin A1C No results for input(s): "HGBA1C" in the last 72 hours. Fasting Lipid Panel No results for input(s): "CHOL", "HDL", "LDLCALC", "TRIG", "CHOLHDL", "LDLDIRECT" in the last 72 hours. Thyroid Function Tests No results for  input(s): "TSH", "T4TOTAL", "T3FREE", "THYROIDAB" in the last 72 hours.  Invalid input(s): "FREET3"  Other results:   Imaging    DG Abd Portable 1V  Result Date: 08/08/2023 CLINICAL DATA:  Encounter for feeding tube EXAM: PORTABLE ABDOMEN - 1 VIEW COMPARISON:  None Available. FINDINGS: Feeding tube tip is at the level of the gastric body. There is gaseous distention of bowel throughout the upper abdomen. IMPRESSION: Feeding tube tip is at the level of the gastric body. Electronically Signed   By: Darliss Cheney M.D.   On: 08/08/2023 17:33     Medications:     Scheduled Medications:  apixaban  5 mg Oral BID   atorvastatin  40 mg Oral Daily   Chlorhexidine Gluconate Cloth  6 each Topical Daily   feeding supplement  237 mL Oral BID BM   feeding supplement (PROSource TF20)  60 mL Per Tube Daily   insulin aspart  0-9 Units Subcutaneous  Q4H   nicotine  21 mg Transdermal Daily   sodium chloride flush  10 mL Intravenous Q12H   sodium chloride flush  10 mL Intracatheter Q12H   sodium chloride flush  10-40 mL Intracatheter Q12H   [START ON 08/11/2023] thiamine (VITAMIN B1) injection  100 mg Intravenous Q24H    Infusions:  amiodarone 60 mg/hr (08/09/23 0700)   feeding supplement (OSMOLITE 1.5 CAL) Stopped (08/09/23 0430)   milrinone 0.375 mcg/kg/min (08/09/23 0700)   norepinephrine (LEVOPHED) Adult infusion 6 mcg/min (08/09/23 0700)   piperacillin-tazobactam (ZOSYN)  IV 12.5 mL/hr at 08/09/23 0700   potassium chloride 50 mL/hr at 08/09/23 0700   thiamine (VITAMIN B1) injection Stopped (08/08/23 1015)    PRN Medications: acetaminophen, docusate sodium, ipratropium-albuterol, polyethylene glycol, sodium chloride flush, traZODone    Patient Profile   77 y.o. male with chronic systolic CHF/ICM s/p CRT-D, CAD s/p CABG, severe MR, PAD, PAF, PVCs. Admitted with recurrent Afib with RVR c/b cardiogenic shock.   Assessment/Plan  1. Acute on chronic systolic CHF -> cardiogenic shock: Ischemic cardiomyopathy.  St Jude CRT-D device. TEE in 7/24 showing EF 30-35%, severe MR with restricted posterior leaflet and small flail segment in posterior leaflet, RV normal, mild AI.  RHC 7/24 with elevated filling pressures and low cardiac output. Echo 07/29/23 EF 35-40% RV moderately reduced severe MR Patient is quite frail, NYHA class IIIb symptoms.   - He is now in profound cardiogenic shock in setting of recurrent AF - Limited echo 11/17: EF 25-30%, RV mildly reduced - CO-OX 52% on milrinone 0.375 + NE 6. CVP 12. Continue IV lasix 80 BID and add diamox 500 mg X 1.  - Attempt DCCV tomorrow if volume improved. Also with some concern for chronic slow GI bleed vs bleeding from ischemic colitis - May see if candidate for mTEER prior to discharge. - Given baseline functional status and fraility he is not candidate for advanced therapies and severe  PAD precludes any form of temporary mechanical support - Palliative Care consulted for GOC discussions.    2. CAD: s/p CABG in 2014. Cath in 7/24 showed patent grafts.   - No s/s angina  - Continue home statin. Could consider aspirin once hemoglobin stable.  3. Mitral regurgitation: TEE (7/24) with severe MR with restricted posterior leaflet and small flail segment in posterior leaflet.  Suspect primarily infarct-related MR.  I suspect MR is playing a significant role in his symptomatology.  - Once optimized, will discuss with structural team to see if he is a candidate for mTEER while  inpatient.   4. Atrial fibrillation, paroxysmal   Has failed amiodarone (concern for lung toxicity, discussed with CCM not sure if true toxicity) as well as sotalol (break through).  Currently, not on an antiarrhythmic but in NSR.  - He is back in AF now with severe shock. Amio is only option. Continue IV amio  - Continue apixaban 5 mg bid. (He missed one dose PTA). - Supp K and Mag aggressively today - DCCV tentatively tomorrow - Doubt needs TEE with missing just one dose as metabolism of apixban was likely slowed with shock liver kidney   5. Shock liver - continue supportive care.  - Improving. - Follow   6. AKI - due to shock Scr 1.1 -> 1.4, improved to 1.0 today - supportive care. Follow   7. PVCs: Frequent.  Continue mexiletine.    8. PAD: S/p aortobifemoral bypass 2014 then SFA stent 2022.  Followed by Dr. Chestine Spore.  Had left foot ulcerations that have healed.   9. Hypervolemic hyponatremia: Na 126 - Fluid restrict - Diuresis  10. Anemia: - Hgb 13>>9 last few days, check CBC this am. Check FOBT -  T sat 2%, Iron 7, Ferritin 75 - Discussed with Pharm D and CCM. Give IV iron. Recheck iron stores for confirmation. - ? Chronic slow bleeding +/- blood loss from ischemic colitis  11. Pancolitis: -Noted on CT at admission.  -In setting of profound shock -Completing a week of Zosyn d/t risk of gut  translocation.   10. Physical deconditioning: Needs aggressive PT/OT.  - Consults ordered  Plan discussed with CCM at bedside.   Length of Stay: 3  Avian Konigsberg N, PA-C  08/09/2023, 7:06 AM  Advanced Heart Failure Team Pager [phone] (M-F; 7a - 5p)  Please contact [facility] for night-coverage after hours (5p -7a ) and weekends on [email or web address]

## 2023-08-09 NOTE — Evaluation (Signed)
Physical Therapy Evaluation Patient Details Name: AGAM EVELAND MRN: [id number] DOB: 11-30-1945 Today's Date: 08/09/2023  History of Present Illness  77 y.o. male admitted 11/16 with fatigue, weakness, N/V, FTT. PT with acute liver injury and lactic acidosis. PMHx: Multiple recent admissions with heart failure, shock, lactic acid build up in setting of Afib with RVR. CAD s/p CABG, ICM s/p CRT-D, DM, severe MVR, PAD s/p aortobifemoral BPG  Clinical Impression  Pt presents with decreased mobility. Pt reports he is able ambulate without assistance at home with RW, wife and daughter report pt has been receiving assistance from them to ambulate and complete ADLs. Pt showed poor trunk control while seated requiring use of BUE and armrest to maintain upright seated position, ambulated very short distance with modA and chair follow, ambulation very unsteady and required therapist assistance to maintain balance, pt seemed unaware of deficits and had poor safety awareness when ambulating. Pt would benefit from continued acute skilled physical therapy intervention to maximize function and independence. Patient will benefit from continued inpatient follow up therapy, <3 hours/day         If plan is discharge home, recommend the following: A lot of help with walking and/or transfers;A lot of help with bathing/dressing/bathroom;Assistance with cooking/housework;Assist for transportation   Can travel by private vehicle   Yes    Equipment Recommendations None recommended by PT  Recommendations for Other Services       Functional Status Assessment Patient has had a recent decline in their functional status and demonstrates the ability to make significant improvements in function in a reasonable and predictable amount of time.     Precautions / Restrictions Precautions Precautions: Fall Restrictions Weight Bearing Restrictions: No      Mobility  Bed Mobility Overal bed mobility: Needs  Assistance Bed Mobility: Sit to Supine       Sit to supine: Mod assist   General bed mobility comments: Pt required assistance to elevate LE into bed and guide trunk safely with line management, required mod+2 to slide up to [facility]    Transfers Overall transfer level: Needs assistance Equipment used: Rolling walker (2 wheels) Transfers: Sit to/from Stand, Bed to chair/wheelchair/BSC Sit to Stand: Contact guard assist   Step pivot transfers: Mod assist, +2 safety/equipment       General transfer comment: Pt STSx4, able to arise from recliner using handrails without assist, CGA to maintain safety into RW, modA for step pivot to bed, pt required assistance staying within bounds of RW and facilitating turn to bedside    Ambulation/Gait Ambulation/Gait assistance: Mod assist, +2 safety/equipment Gait Distance (Feet): 8 Feet Assistive device: Rolling walker (2 wheels) Gait Pattern/deviations: Step-to pattern, Knee flexed in stance - right, Knee flexed in stance - left, Knees buckling, Trunk flexed, Decreased step length - right, Decreased step length - left, Decreased stride length   Gait velocity interpretation: <1.31 ft/sec, indicative of household ambulator   General Gait Details: pt required modA with chair follow to ambulate, pt forward flexed into RW, drags LLE behind and will not stay in bounds of RW, ambulated 72ft-8ft-5ft, required seated breaks after all bouts of ambulation for safety as pt was very unstable during gait  Stairs            Wheelchair Mobility     Tilt Bed    Modified Rankin (Stroke Patients Only)       Balance Overall balance assessment: Needs assistance Sitting-balance support: Feet unsupported, Bilateral upper extremity supported Sitting balance-Leahy Scale: Poor Sitting  balance - Comments: pt constantly pulled forward in recliner with BUE, poor trunk control would rapidly descend posteriorly without use of arm rest   Standing balance support:  Bilateral upper extremity supported, During functional activity, Reliant on assistive device for balance Standing balance-Leahy Scale: Poor Standing balance comment: reliant on RW and therapist assist using gait belt to maintain standing                             Pertinent Vitals/Pain Pain Assessment Pain Assessment: Faces Faces Pain Scale: Hurts little more Pain Location: BLE with transfers Pain Descriptors / Indicators: Sore Pain Intervention(s): Monitored during session, Limited activity within patient's tolerance    Home Living Family/patient expects to be discharged to:: Private residence Living Arrangements: Spouse/significant other Available Help at Discharge: Family;Available 24 hours/day Type of Home: House Home Access: Ramped entrance       Home Layout: Able to live on main level with bedroom/bathroom Home Equipment: Rollator (4 wheels);Rolling Walker (2 wheels);Electric scooter;Cane - single point;BSC/3in1;Shower seat;Grab bars - toilet;Hand held shower head (bed rails)      Prior Function Prior Level of Function : Needs assist             Mobility Comments: ambulates short distances in home with RW. Reports frequent falls. Electric scooter in community and on ramp into house. Wife assists with more complex transfer (shower, etc) ADLs Comments: Wife assists as needed, primarily with LB dressing and bathing. Wife performs all home making tasks     Extremity/Trunk Assessment   Upper Extremity Assessment Upper Extremity Assessment: Defer to OT evaluation     Lower Extremity Assessment Lower Extremity Assessment: Generalized weakness    Cervical / Trunk Assessment Cervical / Trunk Assessment: Kyphotic Cervical / Trunk Exceptions: poor truncal control/weakness  Communication   Communication Communication: No apparent difficulties Cueing Techniques: Verbal cues;Tactile cues  Cognition Arousal: Alert Behavior During Therapy: WFL for tasks  assessed/performed Overall Cognitive Status: Impaired/Different from baseline Area of Impairment: Safety/judgement, Awareness, Problem solving, Following commands                       Following Commands: Follows one step commands consistently, Follows one step commands with increased time Safety/Judgement: Decreased awareness of safety, Decreased awareness of deficits Awareness: Emergent Problem Solving: Slow processing, Requires verbal cues, Requires tactile cues General Comments: impulsive and quick mover, poor saftey awareness        General Comments General comments (skin integrity, edema, etc.): VSS    Exercises     Assessment/Plan    PT Assessment Patient needs continued PT services  PT Problem List Decreased strength;Decreased range of motion;Decreased activity tolerance;Decreased balance;Decreased mobility;Decreased coordination;Decreased knowledge of use of DME;Decreased safety awareness       PT Treatment Interventions DME instruction;Gait training;Stair training;Therapeutic activities;Therapeutic exercise;Functional mobility training;Balance training;Patient/family education    PT Goals (Current goals can be found in the Care Plan section)  Acute Rehab PT Goals Patient Stated Goal: walk without assistance PT Goal Formulation: With patient/family Time For Goal Achievement: 08/23/23 Potential to Achieve Goals: Fair    Frequency Min 1X/week     Co-evaluation               AM-PAC PT "6 Clicks" Mobility  Outcome Measure Help needed turning from your back to your side while in a flat bed without using bedrails?: A Little Help needed moving from lying on your back to sitting on the  side of a flat bed without using bedrails?: A Little Help needed moving to and from a bed to a chair (including a wheelchair)?: A Little Help needed standing up from a chair using your arms (e.g., wheelchair or bedside chair)?: A Little Help needed to walk in hospital  room?: A Lot Help needed climbing 3-5 steps with a railing? : Total 6 Click Score: 15    End of Session Equipment Utilized During Treatment: Gait belt Activity Tolerance: Patient tolerated treatment well Patient left: in bed;with call bell/phone within reach;with family/visitor present Nurse Communication: Mobility status PT Visit Diagnosis: Unsteadiness on feet (R26.81);Other abnormalities of gait and mobility (R26.89);Muscle weakness (generalized) (M62.81);Difficulty in walking, not elsewhere classified (R26.2)    Time:  -      Charges:                 Andrey Farmer SPT Secure chat preferred   Darlin Drop 08/09/2023, 1:36 PM

## 2023-08-09 NOTE — Progress Notes (Addendum)
Palliative Medicine Inpatient Follow Up Note HPI: 77 year old man w/ hx of CAD post CABG, ischemic cardiomyopathy s/p CRT-D, DM, severe MVR, PADs/p aortobifemoral bypass, Afib presenting with increasing fatigue, weakness, anorexia, N/V, abdominal cramping and FTT. Palliative care consulted for goals of care conversations in the setting of end stage disease.   Today's Discussion 08/09/2023  *Please note that this is a verbal dictation therefore any spelling or grammatical errors are due to the "Dragon Medical One" system interpretation.  Chart reviewed inclusive of vital signs, progress notes, laboratory results, and diagnostic images. Concern for slow GIB. Possible DCCV tomorrow.   I met with [name] [name] at bedside this morning at bedside. He is in the company of his wife, [name] [name]. He shares that he feels well this morning. He expressed that he has no shortness of breath or pain this morning. He is asking for an "[employer] roast beef", I shared that this may not be consistent with a heart healthy diet though I would bring it up to his RN.   [name] [name]'s wife asked more information about HCPOA documents. She does not feel that they have this. She thinks all that they have is the living will. I provided a copy to [facility] and explained the document to both [name] [name] and [name] [name] in greater detail. I shared that our chaplain will check in to support them further with document completion.   Patient encouraged to mobilize as able.  ____________________________ Addendum:  I met with [name] [name] this afternoon in the presence of his wife, [name] [name] and daughter, [name] [name]. We discussed his current health and the plan for possible DCCV tomorrow pending blood work stability (Hgb/Hct) and diuresis.   Discussed patients living will as written. Patient in agreement with all information and decisions as written.  Reviewed patients HCPOA wishes. He would like to designate his daughter, [name] [name] as his first Management consultant and his spouse,  [name] [name] as his second.  I was able to discuss code status with [name] [name] he is clear that he does not desire CPR or intubation moving forward. He does desire that his AICD stay active at this time.   Questions and concerns addressed/Palliative Support Provided.   Add. Time: 56  Objective Assessment: Vital Signs Vitals:   08/09/23 0700 08/09/23 0800  BP: (!) 106/48   Pulse: 90   Resp: (!) 21   Temp:  (!) 97.5 F (36.4 C)  SpO2: 98%     Intake/Output Summary (Last 24 hours) at 08/09/2023 1610 Last data filed at 08/09/2023 0700 Gross per 24 hour  Intake 2076.09 ml  Output 3300 ml  Net -1223.91 ml   Last Weight  Most recent update: 08/09/2023  6:28 AM    Weight  57.9 kg (127 lb 10.3 oz)            Gen: Extremely frail elderly Caucasian male chronically ill in appearance HEENT: coretrack in place, moist mucous membranes CV: Regular rate and irregular rhythm  PULM: On RA, breathing is even and nonlabored ABD: soft/nontender  EXT: LE edema Neuro: Alert and oriented x3 - hard of hearing  SUMMARY OF RECOMMENDATIONS   NO chest compressions, no intubation, AICD to remain active, patient is agreeable to a short term trial of tube feeding   S/p Coretrack  placement 11/18, tolerating well  Possible cardioversion tomorrow pending volume improvement per HF notes   Allowing time for outcomes  Appreciate Chaplain helping with HCPOA documents   The palliative care team will continue to follow and offer support  Billing based on MDM: High ______________________________________________________________________________________ [name] [name] [place] Palliative Medicine Team Team Cell Phone: [phone] Please utilize secure chat with additional questions, if there is no response within 30 minutes please call the above phone number  Palliative Medicine Team providers are available by phone from 7am to 7pm daily and can be reached through the team cell phone.  Should this  patient require assistance outside of these hours, please call the patient's attending physician.

## 2023-08-09 NOTE — Progress Notes (Signed)
This chaplain responded to PMT NP-Michele consult for creating the Pt. HCPOA.   The Pt. is sleeping at the time of the visit. The Pt. wife-[name] and daughter-Regina are at the bedside. The chaplain understands the Pt. HCPOA is filled out on the shelf. The chaplain will plan a revisit for providing clarifying education and a notary for completion.  Chaplain Stephanie Acre [phone]

## 2023-08-09 NOTE — Progress Notes (Signed)
[facility] ADULT ICU REPLACEMENT PROTOCOL   The patient does apply for the [facility] Adult ICU Electrolyte Replacment Protocol based on the criteria listed below:   1.Exclusion criteria: TCTS, ECMO, Dialysis, and Myasthenia Gravis patients 2. Is GFR >/= 30 ml/min? Yes.    Patient's GFR today is >60 3. Is SCr </= 2? Yes.   Patient's SCr is 1.0 mg/dL 4. Did SCr increase >/= 0.5 in 24 hours? No. 5.Pt's weight >40kg  Yes.   6. Abnormal electrolyte(s): k 3.2  7. Electrolytes replaced per protocol 8.  Call MD STAT for K+ </= 2.5, Phos </= 1, or Mag </= 1 Physician:    Markus Daft A 08/09/2023 4:47 AM

## 2023-08-09 NOTE — Inpatient Diabetes Management (Addendum)
Inpatient Diabetes Program Recommendations  AACE/ADA: New Consensus Statement on Inpatient Glycemic Control (2015)  Target Ranges:  Prepandial:   less than 140 mg/dL      Peak postprandial:   less than 180 mg/dL (1-2 hours)      Critically ill patients:  140 - 180 mg/dL   Lab Results  Component Value Date   GLUCAP 366 (H) 08/09/2023   HGBA1C 9.6 (H) 06/13/2023    Review of Glycemic Control  Latest Reference Range & Units 08/08/23 20:05 08/08/23 23:30 08/09/23 03:43 08/09/23 07:46 08/09/23 11:25 08/09/23 11:28  Glucose-Capillary 70 - 99 mg/dL 161 (H) 096 (H) 045 (H) 195 (H) 315 (H) 366 (H)  (H): Data is abnormally high  Diabetes history: DM2 Outpatient Diabetes medications:  Jardiance 25 mg every day Metformin 1000 mg BID Glipizide 5 mg BID Januvia 100 mg every day  Current orders for Inpatient glycemic control:  Novolog 0-9 units Q4H Osmolite with a goal of 45 ml/hr  Inpatient Diabetes Program Recommendations:    Please consider tube feed coverage:  Novolog 3 units Q4H while on tube feeds.  Hold if feeds are held or discontinued.    Met with patient's daughter and spouse at bedside.  Reviewed patient's current A1c of 10%. Explained what a A1c is and what it measures. Also reviewed goal A1c with patient, importance of good glucose control @ home, and blood sugar goals.  Family states that he recently started Januvia.  They state he does not watch his diet consistently.  He will occasionally get motivated and watch his diet but that does not last long per family.  He will order donuts and other sweet/foods and have them delivered to the house.  Artifical sweeteners give him diarrhea.  Ordered the LWWD and it is at bedside.  Family states he knows what he needs to do but does not abide.  Educated on The Plate Method, CHO's, portion control, CBGs at home fasting and mid afternoon, F/U with PCP every 3 months, bring meter to PCP office, long and short term complications of uncontrolled  BG, and importance of exercise.  Will continue to follow while inpatient.  Thank you, Dulce Sellar, MSN, CDCES Diabetes Coordinator Inpatient Diabetes Program [phone] (team pager from 8a-5p)

## 2023-08-10 ENCOUNTER — Other Ambulatory Visit: Payer: Self-pay

## 2023-08-10 DIAGNOSIS — R64 Cachexia: Secondary | ICD-10-CM

## 2023-08-10 DIAGNOSIS — I480 Paroxysmal atrial fibrillation: Secondary | ICD-10-CM

## 2023-08-10 DIAGNOSIS — I5023 Acute on chronic systolic (congestive) heart failure: Secondary | ICD-10-CM

## 2023-08-10 DIAGNOSIS — E43 Unspecified severe protein-calorie malnutrition: Secondary | ICD-10-CM | POA: Diagnosis not present

## 2023-08-10 DIAGNOSIS — I4891 Unspecified atrial fibrillation: Secondary | ICD-10-CM | POA: Diagnosis not present

## 2023-08-10 DIAGNOSIS — Z7189 Other specified counseling: Secondary | ICD-10-CM | POA: Diagnosis not present

## 2023-08-10 DIAGNOSIS — E871 Hypo-osmolality and hyponatremia: Secondary | ICD-10-CM | POA: Diagnosis not present

## 2023-08-10 DIAGNOSIS — R570 Cardiogenic shock: Secondary | ICD-10-CM | POA: Diagnosis not present

## 2023-08-10 DIAGNOSIS — Z515 Encounter for palliative care: Secondary | ICD-10-CM | POA: Diagnosis not present

## 2023-08-10 DIAGNOSIS — I340 Nonrheumatic mitral (valve) insufficiency: Secondary | ICD-10-CM

## 2023-08-10 LAB — BASIC METABOLIC PANEL
Anion gap: 9 (ref 5–15)
Anion gap: 10 (ref 5–15)
BUN: 26 mg/dL — ABNORMAL HIGH (ref 8–23)
BUN: 20 mg/dL (ref 8–23)
CO2: 28 mmol/L (ref 22–32)
CO2: 28 mmol/L (ref 22–32)
Calcium: 8.3 mg/dL — ABNORMAL LOW (ref 8.9–10.3)
Calcium: 8.5 mg/dL — ABNORMAL LOW (ref 8.9–10.3)
Chloride: 89 mmol/L — ABNORMAL LOW (ref 98–111)
Chloride: 89 mmol/L — ABNORMAL LOW (ref 98–111)
Creatinine, Ser: 0.97 mg/dL (ref 0.61–1.24)
Creatinine, Ser: 0.91 mg/dL (ref 0.61–1.24)
GFR, Estimated: >60 mL/min (ref >60)
GFR, Estimated: >60 mL/min (ref >60)
Glucose, Bld: 256 mg/dL — ABNORMAL HIGH (ref 70–99)
Glucose, Bld: 299 mg/dL — ABNORMAL HIGH (ref 70–99)
Potassium: 4.3 mmol/L (ref 3.5–5.1)
Potassium: 4 mmol/L (ref 3.5–5.1)
Sodium: 126 mmol/L — ABNORMAL LOW (ref 135–145)
Sodium: 127 mmol/L — ABNORMAL LOW (ref 135–145)

## 2023-08-10 LAB — COOXEMETRY PANEL
Carboxyhemoglobin: 1.4 % (ref 0.5–1.5)
Carboxyhemoglobin: 2.2 % — ABNORMAL HIGH (ref 0.5–1.5)
Methemoglobin: 0.7 % (ref 0.0–1.5)
Methemoglobin: <0.7 % (ref 0.0–1.5)
O2 Saturation: 43.6 %
O2 Saturation: 53.9 %
Total hemoglobin: 9.4 g/dL — ABNORMAL LOW (ref 12.0–16.0)
Total hemoglobin: 9.6 g/dL — ABNORMAL LOW (ref 12.0–16.0)

## 2023-08-10 LAB — GLUCOSE, CAPILLARY
Glucose-Capillary: 216 mg/dL — ABNORMAL HIGH (ref 70–99)
Glucose-Capillary: 243 mg/dL — ABNORMAL HIGH (ref 70–99)
Glucose-Capillary: 248 mg/dL — ABNORMAL HIGH (ref 70–99)
Glucose-Capillary: 255 mg/dL — ABNORMAL HIGH (ref 70–99)
Glucose-Capillary: 259 mg/dL — ABNORMAL HIGH (ref 70–99)
Glucose-Capillary: 346 mg/dL — ABNORMAL HIGH (ref 70–99)

## 2023-08-10 LAB — CBC
HCT: 28.5 % — ABNORMAL LOW (ref 39.0–52.0)
Hemoglobin: 9.1 g/dL — ABNORMAL LOW (ref 13.0–17.0)
MCH: 24.1 pg — ABNORMAL LOW (ref 26.0–34.0)
MCHC: 31.9 g/dL (ref 30.0–36.0)
MCV: 75.4 fL — ABNORMAL LOW (ref 80.0–100.0)
Platelets: 166 10*3/uL (ref 150–400)
RBC: 3.78 MIL/uL — ABNORMAL LOW (ref 4.22–5.81)
RDW: 17.3 % — ABNORMAL HIGH (ref 11.5–15.5)
WBC: 10.8 10*3/uL — ABNORMAL HIGH (ref 4.0–10.5)
nRBC: 1.1 % — ABNORMAL HIGH (ref 0.0–0.2)

## 2023-08-10 LAB — PHOSPHORUS
Phosphorus: 3.2 mg/dL (ref 2.5–4.6)
Phosphorus: 2.4 mg/dL — ABNORMAL LOW (ref 2.5–4.6)

## 2023-08-10 LAB — MAGNESIUM
Magnesium: 2.2 mg/dL (ref 1.7–2.4)
Magnesium: 1.9 mg/dL (ref 1.7–2.4)

## 2023-08-10 MED ORDER — POTASSIUM CHLORIDE 20 MEQ PO PACK
40.0000 meq | PACK | Freq: Once | ORAL | Status: AC
  Administered 2023-08-10: 40 meq via ORAL
  Filled 2023-08-10: qty 2

## 2023-08-10 MED ORDER — KETAMINE HCL 50 MG/ML IJ SOLN
1.0000 mg/kg | Freq: Once | INTRAMUSCULAR | Status: DC
  Filled 2023-08-10: qty 1.1

## 2023-08-10 MED ORDER — FUROSEMIDE 10 MG/ML IJ SOLN
15.0000 mg/h | INTRAVENOUS | Status: DC
  Administered 2023-08-10 – 2023-08-12 (×4): 15 mg/h via INTRAVENOUS
  Filled 2023-08-10 (×6): qty 20

## 2023-08-10 MED ORDER — POTASSIUM CHLORIDE 10 MEQ/50ML IV SOLN
10.0000 meq | INTRAVENOUS | Status: AC
  Administered 2023-08-10 (×4): 10 meq via INTRAVENOUS
  Filled 2023-08-10 (×3): qty 50

## 2023-08-10 MED ORDER — INSULIN GLARGINE-YFGN 100 UNIT/ML ~~LOC~~ SOLN
10.0000 [IU] | Freq: Every day | SUBCUTANEOUS | Status: DC
  Administered 2023-08-10: 10 [IU] via SUBCUTANEOUS
  Filled 2023-08-10 (×2): qty 0.1

## 2023-08-10 MED ORDER — POTASSIUM CHLORIDE CRYS ER 20 MEQ PO TBCR
40.0000 meq | EXTENDED_RELEASE_TABLET | Freq: Once | ORAL | Status: AC
  Administered 2023-08-10: 40 meq via ORAL
  Filled 2023-08-10: qty 2

## 2023-08-10 MED ORDER — KETAMINE HCL 50 MG/5ML IJ SOSY
PREFILLED_SYRINGE | INTRAMUSCULAR | Status: AC
  Administered 2023-08-10: 55 mg via INTRAVENOUS
  Filled 2023-08-10: qty 10

## 2023-08-10 MED ORDER — KETAMINE HCL 50 MG/5ML IJ SOSY: 1.0000 mg/kg | PREFILLED_SYRINGE | Freq: Once | INTRAMUSCULAR | Status: AC

## 2023-08-10 MED ORDER — MIDAZOLAM HCL 2 MG/2ML IJ SOLN
1.0000 mg | Freq: Once | INTRAMUSCULAR | Status: AC
  Administered 2023-08-10: 1 mg via INTRAVENOUS
  Filled 2023-08-10: qty 2

## 2023-08-10 NOTE — Progress Notes (Signed)
Chaplain paged to pt's bedside to validate completed Health Care Power of Attorney Documentation and assist with signing and notarization. Mr [name] [name] had previously completed a living will, which was already placed on his chart.   Chaplain verified that Mr [name] [name] was alert and oriented and wanted to name his daughter   [name] [name] [address] [place], [place] [address] [phone] [email or web address]) as his primary health care agent   and [name] [name] [facility] [place] [address] [phone]) as his secondary agent.   Chaplain procured a notary and witnesses and the documents were satisfactorily completed prior to pt's procedure. Chaplain gave original documents to pt's wife to hold until chaplain could return to copy the documents and send them to [email or web address] for uploading onto the pt chart. The medical team was made aware of the updates to the pt's health care agent and both his daughter and spouse were on site during the sedated procedure.   Maryanna Shape. Carley Hammed, M.Div. [facility] Chaplain Pager [phone] Office [phone]

## 2023-08-10 NOTE — Inpatient Diabetes Management (Signed)
Inpatient Diabetes Program Recommendations  AACE/ADA: New Consensus Statement on Inpatient Glycemic Control (2015)  Target Ranges:  Prepandial:   less than 140 mg/dL      Peak postprandial:   less than 180 mg/dL (1-2 hours)      Critically ill patients:  140 - 180 mg/dL   Lab Results  Component Value Date   GLUCAP 259 (H) 08/10/2023   HGBA1C 9.6 (H) 06/13/2023    Review of Glycemic Control  Latest Reference Range & Units 08/09/23 07:46 08/09/23 11:25 08/09/23 11:28 08/09/23 16:05 08/09/23 20:02 08/09/23 23:46 08/10/23 03:40 08/10/23 07:49  Glucose-Capillary 70 - 99 mg/dL 865 (H) 784 (H) 696 (H) 370 (H) 264 (H) 245 (H) 243 (H) 259 (H)  (H): Data is abnormally high  Diabetes history: DM2 Outpatient Diabetes medications:  Jardiance 25 mg every day Metformin 1000 mg BID Glipizide 5 mg BID Januvia 100 mg every day   Current orders for Inpatient glycemic control:  Novolog 0-9 units Q4H Novolog 3 units Q4H tube feed coverage Osmolite with a goal of 45 ml/hr  Inpatient Diabetes Program Recommendations:    Please consider:  Semglee 10 units every day  Will continue to follow while inpatient.  Thank you, Dulce Sellar, MSN, CDCES Diabetes Coordinator Inpatient Diabetes Program [phone] (team pager from 8a-5p)

## 2023-08-10 NOTE — Progress Notes (Signed)
Conscious sedation for cardioversion.  Versed 1mg  given at 10:53 Ketamine 55mg  given at 10:54 First shock delivered at 10:56 Second shock delivered at 10:58  Supervised post-procedure until he was awake at 11: 50  Steffanie Dunn, DO 08/10/23 11:50 AM  Pulmonary & Critical Care  For contact information, see Amion. If no response to pager, please call PCCM consult pager. After hours, 7PM- 7AM, please call Elink.

## 2023-08-10 NOTE — Progress Notes (Signed)
Planning for cardioversion by Cardiology today. Needs moderate sedation for procedure. Discussed risks, benefits, alternatives to procedures.   Mallampati 2 ASA 4  Planning to proceed with versed & ketamine. Preoxygenate with 6L [place].   Steffanie Dunn, DO 08/10/23 10:20 AM [facility]  For contact information, see Amion. If no response to pager, please call PCCM consult pager. After hours, 7PM- 7AM, please call Elink.

## 2023-08-10 NOTE — Progress Notes (Signed)
NAME:  [name] [name], MRN:  [id number], DOB:  [birth year], LOS: 4 ADMISSION DATE:  08/06/2023, CONSULTATION DATE:  08/06/23 REFERRING MD:  EDP, CHIEF COMPLAINT:  fatigue   History of Present Illness:  76 year old man w/ hx of CAD post CABG, ischemic cardiomyopathy s/p CRT-D, DM, severe MVR, PADs/p aortobifemoral bypass, Afib presenting with increasing fatigue, weakness, anorexia, N/V, abdominal cramping and FTT.  Workup noted for acute liver injury, lactic acidosis, AKI.  Cardiology is consulted for acute on chronic cardiogenic shock, recurrent AF. PCCM to admit.  Pertinent  Medical History   Past Medical History:  Diagnosis Date   Acid reflux    AICD (automatic cardioverter/defibrillator) present    a. 08/2016 St. Jude (serial Number [id number]) biventricular ICD.   Anemia    Chronic systolic CHF (congestive heart failure) (HCC)    a. 08/2016 Echo: EF 20%, diffuse HK, inf, post AK, mod-sev MR, sev dil LA, mod TR, PASP .   COPD (chronic obstructive pulmonary disease) (HCC)    Coronary artery disease involving native coronary artery of native heart with angina pectoris (HCC)    a. 10/2012 MI after aortobifem bypass, found to have multivessel CAD -->CABG x3;  b. 08/2016 Cath: LM 60, LAD 20m, LCX 60ost/p, RCA 100p, VG->RPDA 50p, VG->OM1 ok, LIMA->LAD ok, EF 25%.   Essential hypertension 03/03/2012   History of blood transfusion 11/2012   "during his CABG"   History of gout X 1   History of stomach ulcers 1970s   Hyperlipidemia associated with type 2 diabetes mellitus (HCC) 09/16/2011   Ischemic cardiomyopathy    a. 08/2016 Echo: EF 20%, diffuse HK, inf, post AK;  b. 08/2016 St. Jude (serial Number [id number]) biventricular ICD.   Myocardial infarction [facility])    PAD (peripheral artery disease) (HCC)    s/p aortobifemoral bypass 10/2012   Peripheral neuropathy    PTSD (post-traumatic stress disorder)    Type II diabetes mellitus (HCC)    Ventricular tachycardia (HCC)    a. 08/2016 St.  Jude (serial Number [id number]) biventricular ICD-->oral amio added.   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 Is now on chronic suppressive antibiotics with dicloxacillin   Significant Hospital Events: Including procedures, antibiotic start and stop dates in addition to other pertinent events   11/16 admit, cardiology consulted 11/17 PMT consulted 11/19 DNI/ DNR  Interim History / Subjective:  Good UOP w/ diureses, sCr stable, coox down 43%, remains on NE 6 mcg/min, milrinone 0.375, and amio, CVP 11 S/p bedside DCCV this am, converted after second attempt  Objective   Blood pressure 108/64, pulse 98, temperature 97.9 F (36.6 C), temperature source Oral, resp. rate 14, height 5\' 6"  (1.676 m), weight 55 kg, SpO2 100%. CVP:  [7 mmHg-32 mmHg] 8 mmHg      Intake/Output Summary (Last 24 hours) at 08/10/2023 1039 Last data filed at 08/10/2023 1033 Gross per 24 hour  Intake 4089.65 ml  Output 4630 ml  Net -540.35 ml   Filed Weights   08/08/23 0639 08/09/23 0600 08/10/23 0630  Weight: 57.3 kg 57.9 kg 55 kg    Examination: General:  chronically ill and frail appearing elderly male in bed in NAD HEENT: MM pink/moist, edentulous uppers, cortrak Neuro:  Awake, oriented, MAE -generalized weakness CV: irir, +murmur, R internal jugular CVL PULM:  non labored, few scattered rales, diminished bases GI: soft, bs+, ND, purwick  Extremities: warm/dry,  no LE edema, poor muscle mass  Skin: no rashes, pressure dressings to both feet  Uop 5.3 L/ 24hrs -1.4L/ 24hrs  Net -2.6 Wts 57.9> 55kg Labs reviewed>   Coox 51.8> 43% (no change in NE or milrinone)  Resolved problem list:     Assessment & Plan:   Acute on chronic cardiogenic shock  Acute on chronic HFrEF due to ischemic cardiomyopathy complicated by shock liver, AKI and encephalopathy CAD s/p CABG AICD +  synchronous pacing wires in place Severe MR - Appreciate HF input - cont NE for gaol MAP > 65 - milrinone per HF - ongoing diureses, UOP remains great, sCr stable as tolerated  - serial coox's,  CVP - GDMT limited due to AKI (since resolved) but ongoing shock - 1 week of zosyn for risk of translocation from colitis; BC remains NGTD x 4, afebrile.  - appreciate ongoing PMT assistance, DNR/ DNI as of 11/19  Persistent Afib/RVR on AC PTA - failed recent cardioversion in 06/2023. RVR may have contributed to decompensation then. Was back in Afib in clinic on 11/14-- was referred to ED but didn't go. Previously failed sotalol (didn't work) and amiodarone avoided due to possible lung toxicity-- diagnosed by PFTs and symptoms alone P:  - s/p bedside DCCV x2 w/ conscious sedation, remains intermittently paced/ LBBB - cont amio per HF despite pulmonary risk, no other options  - cont eliquis  Severe MVR- was scheduled for mitral clip in Nov 2024, then plan for mitral transcatheter edge to edge repair in 12/24 with Dr. Excell Seltzer - prior intervention postponed due to decompensated state - not an open valve replacement candidate due to debility, advanced heart failure, previous sternal infection- not sure if resected. 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 - 7 days of zosyn, to complete 11/22  Hypokalemia - replete prn for goal > 4, Mag > 2 - trend on BMET  PAD; previous aorto- bifem bypass - daily statin, eliquis  DM2 - CBG remains > 200 - cont SSI prn, TF coverage, semglee added - goal BG 140-180 - cont to hold PTA metformin & januvia  COPD Ongoing tobacco abuse - not on PTA inhalers P:  - cont prn nebs - nicotine patch - ongoing smoking cessation  H/o PUD Acute on chronic anemia/ IDA- 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  Last BM 11/19 reportedly non-bloody - cont PPI  Deconditioning & debility - PT, OT  Best Practice (right click and "Reselect all SmartList Selections" daily)   Diet/type: Regular consistency (see orders); TF per cortrak and PO DVT prophylaxis: apixaban GI prophylaxis: PPI Lines: Central line Foley:  N/A Code Status:  DNR/ DNI Last date of multidisciplinary goals of care discussion [08/06/23]   CCT: 30 mins     Posey Boyer, MSN, AG-ACNP-BC [place] [facility] 08/10/2023, 12:10 PM  See Amion for pager If no response to pager , please call [phone] until 7pm After 7:00 pm call Elink  [phone]

## 2023-08-10 NOTE — Progress Notes (Signed)
Advanced Heart Failure Rounding Note  PCP-Cardiologist: Bryan Lemma, MD   Subjective:    CO-OX 43 this morning, worsening hyponatremia. Patient underwent successful DCCV with sedation after the second shock. He had poor BiV pacing so the abott device rep assisted with improvement in pacing burden.   Objective:   Weight Range: 55 kg Body mass index is 19.57 kg/m.   Vital Signs:   Temp:  [97.6 F (36.4 C)-98.3 F (36.8 C)] 98 F (36.7 C) (11/20 1100) Pulse Rate:  [79-119] 92 (11/20 1310) Resp:  [11-22] 13 (11/20 1310) BP: (84-127)/(56-93) 109/69 (11/20 1310) SpO2:  [84 %-100 %] 98 % (11/20 1310) Weight:  [55 kg] 55 kg (11/20 0630) Last BM Date : 08/09/23  Weight change: Filed Weights   08/08/23 0639 08/09/23 0600 08/10/23 0630  Weight: 57.3 kg 57.9 kg 55 kg    Intake/Output:   Intake/Output Summary (Last 24 hours) at 08/10/2023 1647 Last data filed at 08/10/2023 1400 Gross per 24 hour  Intake 2790.1 ml  Output 4330 ml  Net -1539.9 ml      Physical Exam  General:  Cachectic elderly male. HEENT: + CorTrak Neck: supple.  JVP 12-14. R internal jugular  CVC Cor: PMI nondisplaced. Irregular rhythm. No rubs, gallops, 3/6 MR murmur. Lungs: clear Abdomen: soft, nontender, nondistended.  Extremities: no cyanosis, clubbing, rash, edema, atrophic nail changes Neuro: alert & orientedx3. Affect pleasant   Telemetry   Sinus rhythm, BiV paced   Patient Profile   77 y.o. male with chronic systolic CHF/ICM s/p CRT-D, CAD s/p CABG, severe MR, PAD, PAF, PVCs. Admitted with recurrent Afib with RVR c/b cardiogenic shock.   Assessment/Plan  1. Acute on chronic systolic CHF -> cardiogenic shock: Ischemic cardiomyopathy.  St Jude CRT-D device. TEE in 7/24 showing EF 30-35%, severe MR with restricted posterior leaflet and small flail segment in posterior leaflet, RV normal, mild AI.  RHC 7/24 with elevated filling pressures and low cardiac output. Echo 07/29/23 EF 35-40%  RV moderately reduced severe MR Patient is quite frail, NYHA class IIIb symptoms.   - He remains in profound shock in the setting of afib with RVR - Underwent DCCV 11/20 with return of sinus rhythm - Start IV lasix gtt given worsening hyponatremia, lung exam - Will hopefully be able to wean pressors with diuresis and sinus rhythm, if patient continues to fail his wean would plan for likely comfort care - Given baseline functional status and fraility he is not candidate for advanced therapies and severe PAD precludes any form of temporary mechanical support - Bailout mTEER unlikely to provide significant benefit - Palliative Care consulted for GOC discussions.    2. CAD: s/p CABG in 2014. Cath in 7/24 showed patent grafts.   - No s/s angina  - Continue home statin.   3. Mitral regurgitation: TEE (7/24) with severe MR with restricted posterior leaflet and small flail segment in posterior leaflet.  Suspect primarily infarct-related MR, and does appear out of proportion to his LV function and dilation. However, I worry that he is too critically ill to significantly benefit at this time. - If he recovers significantly, will discuss with structural team to see if he is a candidate for mTEER while inpatient.   4. Atrial fibrillation, paroxysmal   Has failed amiodarone (concern for lung toxicity, discussed with CCM not sure if true toxicity) as well as sotalol (break through).   - Continue apixaban 5 mg bid. (He missed one dose PTA). - Supp K and  Mag aggressively today - DCCV 11/20 - Continue IV amiodarone   5. Shock liver - continue supportive care.  - Improving. - Follow   6. AKI - due to shock Scr 1.1 -> 1.4, improved to 1.0 today - supportive care. Follow   7. PVCs: Frequent.  Continue mexiletine.    8. PAD: S/p aortobifemoral bypass 2014 then SFA stent 2022.  Followed by Dr. Chestine Spore.  Had left foot ulcerations that have healed.   9. Hypervolemic hyponatremia: Na 126 - Fluid restrict -  Diuresis  10. Anemia: - Hgb 13>>9 last few days, check CBC this am. Check FOBT -  T sat 2%, Iron 7, Ferritin 75 - Discussed with Pharm D and CCM. Give IV iron. Recheck iron stores for confirmation. 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 Pancolitis: -Noted on CT at admission.  -In setting of profound shock -Completing a week of Zosyn d/t risk of gut translocation.   10. Physical deconditioning: Needs aggressive PT/OT.  - Consults ordered  Plan discussed with CCM at bedside.   Length of Stay: 4  Romie Minus, MD  08/10/2023, 4:47 PM  Advanced Heart Failure Team Pager [phone] (M-F; 7a - 5p)  Please contact [facility] for night-coverage after hours (5p -7a ) and weekends on [email or web address]  CRITICAL CARE Performed by: Romie Minus   Total critical care time: 60 minutes  Critical care time was exclusive of separately billable procedures and treating other patients.  Critical care was necessary to treat or prevent imminent or life-threatening deterioration.  Critical care was time spent personally by me on the following activities: development of treatment plan with patient and/or surrogate as well as nursing, discussions with consultants, evaluation of patient's response to treatment, examination of patient, obtaining history from patient or surrogate, ordering and performing treatments and interventions, ordering and review of laboratory studies, ordering and review of radiographic studies, pulse oximetry and re-evaluation of patient's condition.

## 2023-08-10 NOTE — Progress Notes (Signed)
Chaplain follow up visit to complete Advance Directive documentation and charting. Chaplain copied ACP documents and emailed to [email or web address] and placed a hard copy with pt stickers on pt's shadow chart. Pt's family given two copies of the documents as well as the notarized originals.  Maryanna Shape. Carley Hammed, M.Div. [facility] Chaplain Pager [phone] Office [phone]

## 2023-08-10 NOTE — Progress Notes (Signed)
   Palliative Medicine Inpatient Follow Up Note HPI: 77 year old man w/ hx of CAD post CABG, ischemic cardiomyopathy s/p CRT-D, DM, severe MVR, PADs/p aortobifemoral bypass, Afib presenting with increasing fatigue, weakness, anorexia, N/V, abdominal cramping and FTT. Palliative care consulted for goals of care conversations in the setting of end stage disease.   Today's Discussion 08/10/2023  *Please note that this is a verbal dictation therefore any spelling or grammatical errors are due to the "Dragon Medical One" system interpretation.  Chart reviewed inclusive of vital signs, progress notes, laboratory results, and diagnostic images.  I met at bedside this morning with Daylon, his wife, [name], and their daughter [name].   We reviewed the plan for Ralph to receive cardioversion this morning.   Discussed plan(s) for HCPOA completion with chaplain.  Summarized goals in terms of patients expectations towards improvement.  Questions and concerns addressed/Palliative Support Provided.   Objective Assessment: Vital Signs Vitals:   08/10/23 1200 08/10/23 1205  BP: 107/72 118/80  Pulse: 97 (!) 101  Resp: 18 19  Temp:    SpO2: 100% 100%    Intake/Output Summary (Last 24 hours) at 08/10/2023 1211 Last data filed at 08/10/2023 1033 Gross per 24 hour  Intake 3421.18 ml  Output 3930 ml  Net -508.82 ml   Last Weight  Most recent update: 08/10/2023  6:34 AM    Weight  55 kg (121 lb 4.1 oz)            Gen: Extremely frail elderly Caucasian male chronically ill in appearance HEENT: coretrack in place, moist mucous membranes CV: Regular rate and irregular rhythm  PULM: On RA, breathing is even and nonlabored ABD: soft/nontender  EXT: LE edema Neuro: Alert and oriented x3 - hard of hearing  SUMMARY OF RECOMMENDATIONS   No chest compressions, no intubation, AICD to remain active, patient is agreeable to a short term trial of tube feeding    Allowing time for  outcomes  Appreciate Chaplain helping with HCPOA documents   The palliative care team will continue to follow and offer support  Billing based on MDM: Moderate ______________________________________________________________________________________ Lamarr Lulas [facility] Palliative Medicine Team Team Cell Phone: [phone] Please utilize secure chat with additional questions, if there is no response within 30 minutes please call the above phone number  Palliative Medicine Team providers are available by phone from 7am to 7pm daily and can be reached through the team cell phone.  Should this patient require assistance outside of these hours, please call the patient's attending physician.

## 2023-08-10 NOTE — Procedures (Signed)
   DIRECT CURRENT CARDIOVERSION  NAME:  [name] [name]    MRN: [id number] DOB:  1946/01/07    ADMIT DATE: 08/06/2023  CARDIOVERSION:     Indications:  Symptomatic Atrial Fibrillation  Informed consent was obtained prior to the procedure. The risks, benefits and alternatives for the procedure were discussed and the patient comprehended these risks.  Risks include, but are not limited to treatment failure, burns to the chest, pain/discomfort, ventricular arrhythmia.   After a procedural time-out, sedation was performed by critical care medicine. The patient had the defibrillator pads placed in the anterior and posterior position. Once an appropriate level of sedation was confirmed, the patient shocked with 200J of biphasic synchronized energy. The initial shock was unsuccessful so a second 200J shock was delivered. The patient converted to NSR with intermittent BiV pacing capture.  There were no apparent complications.  The patient had normal neuro status and respiratory status post procedure with vitals stable as recorded elsewhere.  Adequate airway was maintained throughout and vital signs monitored per protocol.  COMPLICATIONS:    Complications: No complications Patient tolerated procedure well.  Clearnce Hasten Advanced Heart Failure 4:45 PM

## 2023-08-11 ENCOUNTER — Encounter (HOSPITAL_COMMUNITY): Payer: No Typology Code available for payment source | Admitting: Cardiology

## 2023-08-11 DIAGNOSIS — D649 Anemia, unspecified: Secondary | ICD-10-CM | POA: Diagnosis not present

## 2023-08-11 DIAGNOSIS — I5022 Chronic systolic (congestive) heart failure: Secondary | ICD-10-CM | POA: Diagnosis not present

## 2023-08-11 DIAGNOSIS — J9601 Acute respiratory failure with hypoxia: Secondary | ICD-10-CM

## 2023-08-11 DIAGNOSIS — I5023 Acute on chronic systolic (congestive) heart failure: Secondary | ICD-10-CM | POA: Diagnosis not present

## 2023-08-11 DIAGNOSIS — E876 Hypokalemia: Secondary | ICD-10-CM | POA: Diagnosis not present

## 2023-08-11 DIAGNOSIS — R570 Cardiogenic shock: Secondary | ICD-10-CM | POA: Diagnosis not present

## 2023-08-11 DIAGNOSIS — E871 Hypo-osmolality and hyponatremia: Secondary | ICD-10-CM | POA: Diagnosis not present

## 2023-08-11 LAB — BASIC METABOLIC PANEL WITH GFR
Anion gap: 9 (ref 5–15)
BUN: 20 mg/dL (ref 8–23)
CO2: 29 mmol/L (ref 22–32)
Calcium: 8.3 mg/dL — ABNORMAL LOW (ref 8.9–10.3)
Chloride: 87 mmol/L — ABNORMAL LOW (ref 98–111)
Creatinine, Ser: 1.02 mg/dL (ref 0.61–1.24)
GFR, Estimated: >60 mL/min
Glucose, Bld: 125 mg/dL — ABNORMAL HIGH (ref 70–99)
Potassium: 2.9 mmol/L — ABNORMAL LOW (ref 3.5–5.1)
Sodium: 125 mmol/L — ABNORMAL LOW (ref 135–145)

## 2023-08-11 LAB — BASIC METABOLIC PANEL
Anion gap: 10 (ref 5–15)
BUN: 18 mg/dL (ref 8–23)
CO2: 30 mmol/L (ref 22–32)
Calcium: 8.3 mg/dL — ABNORMAL LOW (ref 8.9–10.3)
Chloride: 87 mmol/L — ABNORMAL LOW (ref 98–111)
Creatinine, Ser: 0.91 mg/dL (ref 0.61–1.24)
GFR, Estimated: >60 mL/min (ref >60)
Glucose, Bld: 212 mg/dL — ABNORMAL HIGH (ref 70–99)
Potassium: 3.3 mmol/L — ABNORMAL LOW (ref 3.5–5.1)
Sodium: 127 mmol/L — ABNORMAL LOW (ref 135–145)

## 2023-08-11 LAB — CULTURE, BLOOD (ROUTINE X 2)
Culture: NO GROWTH
Culture: NO GROWTH
Special Requests: ADEQUATE

## 2023-08-11 LAB — GLUCOSE, CAPILLARY
Glucose-Capillary: 172 mg/dL — ABNORMAL HIGH (ref 70–99)
Glucose-Capillary: 333 mg/dL — ABNORMAL HIGH (ref 70–99)
Glucose-Capillary: 324 mg/dL — ABNORMAL HIGH (ref 70–99)
Glucose-Capillary: 134 mg/dL — ABNORMAL HIGH (ref 70–99)
Glucose-Capillary: 209 mg/dL — ABNORMAL HIGH (ref 70–99)

## 2023-08-11 LAB — CBC
HCT: 29.6 % — ABNORMAL LOW (ref 39.0–52.0)
Hemoglobin: 9.6 g/dL — ABNORMAL LOW (ref 13.0–17.0)
MCH: 24.4 pg — ABNORMAL LOW (ref 26.0–34.0)
MCHC: 32.4 g/dL (ref 30.0–36.0)
MCV: 75.1 fL — ABNORMAL LOW (ref 80.0–100.0)
Platelets: 188 K/uL (ref 150–400)
RBC: 3.94 MIL/uL — ABNORMAL LOW (ref 4.22–5.81)
RDW: 17.3 % — ABNORMAL HIGH (ref 11.5–15.5)
WBC: 15.5 K/uL — ABNORMAL HIGH (ref 4.0–10.5)
nRBC: 0.8 % — ABNORMAL HIGH (ref 0.0–0.2)

## 2023-08-11 LAB — COOXEMETRY PANEL
Carboxyhemoglobin: 1.6 % — ABNORMAL HIGH (ref 0.5–1.5)
Carboxyhemoglobin: 2.2 % — ABNORMAL HIGH (ref 0.5–1.5)
Methemoglobin: <0.7 % (ref 0.0–1.5)
Methemoglobin: <0.7 % (ref 0.0–1.5)
O2 Saturation: 58.6 %
O2 Saturation: 53.3 %
Total hemoglobin: 10 g/dL — ABNORMAL LOW (ref 12.0–16.0)
Total hemoglobin: 9.8 g/dL — ABNORMAL LOW (ref 12.0–16.0)

## 2023-08-11 LAB — MAGNESIUM
Magnesium: 1.6 mg/dL — ABNORMAL LOW (ref 1.7–2.4)
Magnesium: 2.2 mg/dL (ref 1.7–2.4)

## 2023-08-11 LAB — PHOSPHORUS
Phosphorus: 2.5 mg/dL (ref 2.5–4.6)
Phosphorus: 1.7 mg/dL — ABNORMAL LOW (ref 2.5–4.6)

## 2023-08-11 MED ORDER — INSULIN DETEMIR 100 UNIT/ML ~~LOC~~ SOLN
10.0000 [IU] | Freq: Two times a day (BID) | SUBCUTANEOUS | Status: DC
  Administered 2023-08-11: 10 [IU] via SUBCUTANEOUS
  Filled 2023-08-11 (×2): qty 0.1

## 2023-08-11 MED ORDER — INSULIN ASPART 100 UNIT/ML IJ SOLN: 0.0000 [IU] | INTRAMUSCULAR | Status: DC

## 2023-08-11 MED ORDER — PHENOL 1.4 % MT LIQD
1.0000 | OROMUCOSAL | Status: DC | PRN
  Administered 2023-08-11 (×2): 1 via OROMUCOSAL
  Filled 2023-08-11: qty 177

## 2023-08-11 MED ORDER — POTASSIUM CHLORIDE 10 MEQ/50ML IV SOLN
10.0000 meq | INTRAVENOUS | Status: AC
  Administered 2023-08-11 (×4): 10 meq via INTRAVENOUS
  Filled 2023-08-11 (×4): qty 50

## 2023-08-11 MED ORDER — POTASSIUM CHLORIDE 20 MEQ PO PACK
40.0000 meq | PACK | Freq: Once | ORAL | Status: AC
  Administered 2023-08-11: 40 meq
  Filled 2023-08-11: qty 2

## 2023-08-11 MED ORDER — POTASSIUM PHOSPHATES 15 MMOLE/5ML IV SOLN
30.0000 mmol | Freq: Once | INTRAVENOUS | Status: AC
  Administered 2023-08-11: 30 mmol via INTRAVENOUS
  Filled 2023-08-11: qty 10

## 2023-08-11 MED ORDER — INSULIN ASPART 100 UNIT/ML IJ SOLN
8.0000 [IU] | Freq: Three times a day (TID) | INTRAMUSCULAR | Status: DC
  Administered 2023-08-12: 8 [IU] via SUBCUTANEOUS

## 2023-08-11 MED ORDER — INSULIN ASPART 100 UNIT/ML IJ SOLN: 0.0000 [IU] | Freq: Three times a day (TID) | INTRAMUSCULAR | Status: DC

## 2023-08-11 MED ORDER — INSULIN DETEMIR 100 UNIT/ML ~~LOC~~ SOLN
10.0000 [IU] | Freq: Two times a day (BID) | SUBCUTANEOUS | Status: DC
  Administered 2023-08-12 – 2023-08-14 (×5): 10 [IU] via SUBCUTANEOUS
  Filled 2023-08-11 (×6): qty 0.1

## 2023-08-11 MED ORDER — POTASSIUM CHLORIDE 20 MEQ PO PACK
20.0000 meq | PACK | Freq: Once | ORAL | Status: AC
  Administered 2023-08-11: 20 meq
  Filled 2023-08-11: qty 1

## 2023-08-11 MED ORDER — MAGNESIUM SULFATE 4 GM/100ML IV SOLN
4.0000 g | Freq: Once | INTRAVENOUS | Status: AC
  Administered 2023-08-11: 4 g via INTRAVENOUS
  Filled 2023-08-11: qty 100

## 2023-08-11 MED ORDER — TAB-A-VITE/IRON PO TABS
1.0000 | ORAL_TABLET | Freq: Every day | ORAL | Status: DC
  Administered 2023-08-11 – 2023-08-14 (×4): 1 via ORAL
  Filled 2023-08-11 (×4): qty 1

## 2023-08-11 MED ORDER — INSULIN ASPART 100 UNIT/ML IJ SOLN
0.0000 [IU] | Freq: Three times a day (TID) | INTRAMUSCULAR | Status: DC
Start: 2023-08-11 — End: 2023-08-13
  Administered 2023-08-11: 11 [IU] via SUBCUTANEOUS
  Administered 2023-08-11: 2 [IU] via SUBCUTANEOUS
  Administered 2023-08-12: 8 [IU] via SUBCUTANEOUS
  Administered 2023-08-12: 15 [IU] via SUBCUTANEOUS
  Administered 2023-08-12: 2 [IU] via SUBCUTANEOUS
  Administered 2023-08-13: 11 [IU] via SUBCUTANEOUS

## 2023-08-11 MED ORDER — INSULIN ASPART 100 UNIT/ML IJ SOLN
0.0000 [IU] | Freq: Every day | INTRAMUSCULAR | Status: DC
Start: 2023-08-11 — End: 2023-08-13
  Administered 2023-08-11: 2 [IU] via SUBCUTANEOUS
  Administered 2023-08-12: 5 [IU] via SUBCUTANEOUS

## 2023-08-11 MED ORDER — MELATONIN 3 MG PO TABS
3.0000 mg | ORAL_TABLET | Freq: Every evening | ORAL | Status: DC | PRN
  Administered 2023-08-11 – 2023-08-14 (×4): 3 mg via ORAL
  Filled 2023-08-11 (×4): qty 1

## 2023-08-11 MED ORDER — POTASSIUM CHLORIDE 20 MEQ PO PACK
40.0000 meq | PACK | ORAL | Status: AC
  Administered 2023-08-11 (×2): 40 meq
  Filled 2023-08-11 (×2): qty 2

## 2023-08-11 MED ORDER — POTASSIUM CHLORIDE 20 MEQ PO PACK
40.0000 meq | PACK | ORAL | Status: DC
  Administered 2023-08-11: 40 meq
  Filled 2023-08-11: qty 2

## 2023-08-11 NOTE — Progress Notes (Addendum)
Advanced Heart Failure Rounding Note  PCP-Cardiologist: Bryan Lemma, MD   Subjective:    Maintaining SR.   CO-OX 59% with assumed Fick CI of 2.3 on milrinone 0.375 + NE 6.   CVP 10. >6L UOP last 24 hrs with lasix gtt at 15/hr.   Feeling well. Good appetite. Ambulated short distance in room yesterday.   Objective:   Weight Range: 55.2 kg Body mass index is 19.64 kg/m.   Vital Signs:   Temp:  [97.5 F (36.4 C)-98 F (36.7 C)] 97.7 F (36.5 C) (11/21 0400) Pulse Rate:  [82-109] 91 (11/21 1115) Resp:  [11-23] 16 (11/21 1115) BP: (90-128)/(53-108) 105/61 (11/21 1115) SpO2:  [85 %-100 %] 100 % (11/21 1115) Weight:  [55.2 kg] 55.2 kg (11/21 0500) Last BM Date : 08/09/23  Weight change: Filed Weights   08/09/23 0600 08/10/23 0630 08/11/23 0500  Weight: 57.9 kg 55 kg 55.2 kg    Intake/Output:   Intake/Output Summary (Last 24 hours) at 08/11/2023 1143 Last data filed at 08/11/2023 1130 Gross per 24 hour  Intake 3990.94 ml  Output 6050 ml  Net -2059.06 ml      Physical Exam  General:  Cachectic elderly male. Sitting up in bed. HEENT: + CorTrak Neck: supple. R internal jugular CVC.  Cor: Regular rate & rhythm. No rubs, gallops, 3/6 holosystolic murmur. Prominent ICD. Lungs: + rhonchi Abdomen: soft, nontender, nondistended.  Extremities: no cyanosis, clubbing, rash, edema Neuro: alert & orientedx3. Affect pleasant    Telemetry   Sinus rhythm, BiV pacing   Patient Profile   77 y.o. male with chronic systolic CHF/ICM s/p CRT-D, CAD s/p CABG, severe MR, PAD, PAF, PVCs. Admitted with recurrent Afib with RVR c/b cardiogenic shock.   Assessment/Plan  1. Acute on chronic systolic CHF -> cardiogenic shock: Ischemic cardiomyopathy.  St Jude CRT-D device. TEE in 7/24 showing EF 30-35%, severe MR with restricted posterior leaflet and small flail segment in posterior leaflet, RV normal, mild AI.  RHC 7/24 with elevated filling pressures and low cardiac output.  Echo 07/29/23 EF 35-40% RV moderately reduced severe MR  - Admitted with profound shock in setting of Afib with RVR - Underwent DCCV 11/20 with return of sinus rhythm - Wean milrinone to 0.25 and repeat CO-OX in 6 hrs. Wean NE as able, currently on 6 mcg/min - Diuresing well. Continue lasix gtt at 15/hr. Supp K and Mag aggressively. - Given baseline functional status and fraility he is not candidate for advanced therapies and severe PAD precludes any form of temporary mechanical support - Bailout mTEER unlikely to provide significant benefit - Palliative Care consulted for GOC discussions.    2. CAD: s/p CABG in 2014. Cath in 7/24 showed patent grafts.   - No s/s angina  - Continue home statin.   3. Mitral regurgitation: TEE (7/24) with severe MR with restricted posterior leaflet and small flail segment in posterior leaflet.  Suspect primarily infarct-related MR, and does appear out of proportion to his LV function and dilation. However, I worry that he is too critically ill to significantly benefit at this time. - If he recovers significantly, will discuss with structural team to see if he is a candidate for mTEER while inpatient.   4. Atrial fibrillation, paroxysmal   Has failed amiodarone (concern for lung toxicity, discussed with CCM not sure if true toxicity) as well as sotalol (break through).   - Continue apixaban 5 mg bid. (He missed one dose PTA). - Supp K and Mag  aggressively today - DCCV 11/20 - Continue IV amiodarone   5. Shock liver - continue supportive care.  - Improving. - Follow   6. AKI - due to shock Scr 1.1 -> 1.4, resolved - supportive care.  - follow   7. PVCs: Frequent.  Continue mexiletine.    8. PAD: S/p aortobifemoral bypass 2014 then SFA stent 2022.  Followed by Dr. Chestine Spore.  Had left foot ulcerations that have healed.   9. Hypervolemic hyponatremia: Na 125 - Fluid restrict - Diuresis  10. Anemia: - Hgb 13>>9-10.  - Iron stores low s/p IV  iron. 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 Pancolitis: -Noted on CT at admission.  -In setting of profound shock -Completing a week of Zosyn d/t risk of gut translocation.   12. Physical deconditioning: Needs aggressive PT/OT.  - Inpatient rehab recommended at discharge  13. Nutrition: Appetite improving. -Trialing off tube feeds today.  Length of Stay: 5  Sandra Tellefsen N, PA-C  08/11/2023, 11:43 AM  Advanced Heart Failure Team Pager [phone] (M-F; 7a - 5p)  Please contact [facility] for night-coverage after hours (5p -7a ) and weekends on [email or web address]

## 2023-08-11 NOTE — Progress Notes (Signed)
NAME:  [name] [name], MRN:  [id number], DOB:  1946-01-23, LOS: 5 ADMISSION DATE:  08/06/2023, CONSULTATION DATE:  08/06/23 REFERRING MD:  EDP, CHIEF COMPLAINT:  fatigue   History of Present Illness:  77 year old man w/ hx of CAD post CABG, ischemic cardiomyopathy s/p CRT-D, DM, severe MVR, PADs/p aortobifemoral bypass, Afib presenting with increasing fatigue, weakness, anorexia, N/V, abdominal cramping and FTT.  Workup noted for acute liver injury, lactic acidosis, AKI.  Cardiology is consulted for acute on chronic cardiogenic shock, recurrent AF. PCCM to admit.  Pertinent  Medical History   Past Medical History:  Diagnosis Date   Acid reflux    AICD (automatic cardioverter/defibrillator) present    a. 08/2016 St. Jude (serial Number [id number]) biventricular ICD.   Anemia    Chronic systolic CHF (congestive heart failure) (HCC)    a. 08/2016 Echo: EF 20%, diffuse HK, inf, post AK, mod-sev MR, sev dil LA, mod TR, PASP .   COPD (chronic obstructive pulmonary disease) (HCC)    Coronary artery disease involving native coronary artery of native heart with angina pectoris (HCC)    a. 10/2012 MI after aortobifem bypass, found to have multivessel CAD -->CABG x3;  b. 08/2016 Cath: LM 60, LAD 23m, LCX 60ost/p, RCA 100p, VG->RPDA 50p, VG->OM1 ok, LIMA->LAD ok, EF 25%.   Essential hypertension 03/03/2012   History of blood transfusion 11/2012   "during his CABG"   History of gout X 1   History of stomach ulcers 1970s   Hyperlipidemia associated with type 2 diabetes mellitus (HCC) 09/16/2011   Ischemic cardiomyopathy    a. 08/2016 Echo: EF 20%, diffuse HK, inf, post AK;  b. 08/2016 St. Jude (serial Number [id number]) biventricular ICD.   Myocardial infarction [facility])    PAD (peripheral artery disease) (HCC)    s/p aortobifemoral bypass 10/2012   Peripheral neuropathy    PTSD (post-traumatic stress disorder)    Type II diabetes mellitus (HCC)    Ventricular tachycardia (HCC)    a. 08/2016 St.  Jude (serial Number [id number]) biventricular ICD-->oral amio added.   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 Is now on chronic suppressive antibiotics with dicloxacillin   Significant Hospital Events: Including procedures, antibiotic start and stop dates in addition to other pertinent events   11/16 admit, cardiology consulted 11/17 PMT consulted 11/19 DNI/ DNR 11/20 successful DCCV, changed to lasix gtt  Interim History / Subjective:  Remains AV paced Pt has some confusion and restlessness overnight, better this morning Denies any pain/ SOB.  Eating much better, diet liberalized, good appetite but CBGs up Afebrile Congested cough, productive at times with tan mucous Coox  58.6% on NE 6, milrinone 0.375, CVP 10 Continues on lasix gtt K and Mag aggressively replaced.  Na 127> 125 UOP 6.2L/ 24hrs, -3L  Objective   Blood pressure (!) 106/56, pulse 91, temperature 97.7 F (36.5 C), temperature source Oral, resp. rate 13, height 5\' 6"  (1.676 m), weight 55.2 kg, SpO2 100%. CVP:  [7 mmHg-14 mmHg] 7 mmHg      Intake/Output Summary (Last 24 hours) at 08/11/2023 0902 Last data filed at 08/11/2023 0800 Gross per 24 hour  Intake 3074.82 ml  Output 6450 ml  Net -3375.18 ml   Filed Weights   08/09/23 0600 08/10/23 0630 08/11/23 0500  Weight: 57.9 kg 55 kg 55.2 kg    Examination: General:  chronically ill and frail appearing  sitting in bed in NAD HEENT: MM pink/moist, pupils 3/r, +JVD Neuro: Awake, oriented x 3, generalized weakness/ MAE CV: AV paced  PULM:  non labored, congested cough, scattered rales, 4L Holcomb GI: soft, bs +, NT, purwick  Extremities: warm/dry, no pitting tibial edema Skin: no rashes  Net -5.6L Wts 57.9> 55> 55.2kg Labs reviewed> Na 127> 125, K 2.9, Cl 87, sCr 1.02, Mag 1.6, WBC 10.8> 15.5, H/H stable Coox 51.8> 43> 58.6% (no change in  NE or milrinone)  Resolved problem list:   AKI Shock liver Encephalopathy   Assessment & Plan:   Acute on chronic cardiogenic shock  Acute on chronic HFrEF due to ischemic cardiomyopathy  CAD s/p CABG St. Jude CRT-D device Severe MR Hypovolemic hyponatremia  AKI, resolved Shock liver, improving Leukocytosis - 11/8 EF 35-40%, RV mod reduced, severe MR - s/p successful DCCV  11/20.  Poor BiV pacing noted, s/p abott device rep w/ improvement in pacing burden P:  - Appreciate HF input - cont NE- do not wean below 5 for now, goal MAP > 65 - continue milrinone per HF, 0.375 - trending CVP/ coox.  CVP still 10, coox better today after DCCV - ongoing diureses w/ lasix gtt with aggressive electrolyte replacement.  sCr stable, good UOP.   - remains hyponatremic despite diuresis, poor prognostic indicator - GDMT limited due to ongoing shock - 1 week of zosyn for risk of translocation from colitis, completes 11/22.  Slight leukocytosis today felt reactive after DCCV as remains afebrile, BC neg. - appreciate ongoing PMT assistance, DNR/ DNI as of 11/19.  Ongoing GOC, continue trying to optimize but remains tenuous.   Persistent Afib/RVR on AC PTA - failed recent cardioversion in 06/2023. RVR may have contributed to decompensation then. Was back in Afib in clinic on 11/14-- was referred to ED but didn't go. Previously failed sotalol (didn't work) and amiodarone avoided due to possible lung toxicity-- diagnosed by PFTs and symptoms alone P:  - successful DCCV 11/20, remains AV paced thus far - cont amio per HF despite pulmonary risk, no other options  - cont eliquis - if Afib returns, will likely not tolerate hemodynamically at all and will need ongoing GOC  Severe MVR- was scheduled for mitral clip in Nov 2024, then plan for mitral transcatheter edge to edge repair in 12/24 with Dr. Excell Seltzer - prior intervention postponed due to decompensated state - not an open valve replacement candidate  due to debility, advanced heart failure, previous sternal infection- not sure if resected. P: - cardiology considering if candidate for m-TEER after optimization.    Pancolitis on CT- likely ischemic - 7 days of zosyn, to complete 11/22  Hypokalemia - aggressive replete and trend on BMET BID - replete prn for goal > 4, Mag > 2  PAD; previous aorto- bifem bypass - daily statin, eliquis  DM2 - CBG remains high with liberalization of diet and pt is eating.  Also exacerbated with dextrose in amio and lasix gtts - changed to levemir 10u BID, added meal coverage and AC/HS SSI PRN - goal BG 140-180 - cont to hold PTA metformin & januvia  COPD Ongoing tobacco abuse Pulmonary edema  - not on PTA inhalers P:  - cont diuresis as above.  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  Continue ensure/ prosource.  Appreciate RD recs.   - Leave cortrak for now, if still able to maintain nutrition, consider removal over weekend - s/p IV iron 11/19, likely repeat again  - daily thiamine - H/H remains stable. Normal BM 11/19 - cont PPI  Deconditioning & debility - PT, OT  At risk for ICU delirium - some confusion overnight, better this am.  D/c trazadone, ?if contributing, not worked well for him in past per wife.  PRN melatonin and delirium precautions discussed with wife and pt as pt sleeps a lot during the day.   Best Practice (right click and "Reselect all SmartList Selections" daily)   Diet/type: Regular consistency (see orders); TF per cortrak and PO DVT prophylaxis: apixaban GI prophylaxis: PPI Lines: Central line Foley:  N/A Code Status:  DNR/ DNI Last date of  multidisciplinary goals of care discussion [08/06/23]   CCT: 30 mins   Posey Boyer, MSN, AG-ACNP-BC  Pulmonary & Critical Care 08/11/2023, 9:02 AM  See Amion for pager If no response to pager , please call [phone] until 7pm After 7:00 pm call Elink  [phone]

## 2023-08-11 NOTE — TOC Progression Note (Signed)
Transition of Care [facility]) - Progression Note    Patient Details  Name: [name] [name] MRN: [id number] Date of Birth: November 10, 1945  Transition of Care [facility]) CM/SW Contact  Lockie Pares, RN Phone Number: 08/11/2023, 12:09 PM  Clinical Narrative:     [facility] checked in to see if patient  was a potential  referral. Patient is currently on lasix drip and has worsening hyponatremia. Palliative has been consulted.   Will continue to monitor for potential LTAC referral   Expected Discharge Plan: Home w Home Health Services Barriers to Discharge: Continued Medical Work up  Expected Discharge Plan and Services   Discharge Planning Services: CM Consult                                           Social Determinants of Health (SDOH) Interventions SDOH Screenings   Food Insecurity: No Food Insecurity (08/08/2023)  Housing: Low Risk  (08/08/2023)  Transportation Needs: No Transportation Needs (08/08/2023)  Utilities: Not At Risk (08/08/2023)  Tobacco Use: High Risk (08/06/2023)    Readmission Risk Interventions     No data to display

## 2023-08-11 NOTE — Progress Notes (Signed)
[facility] ADULT ICU REPLACEMENT PROTOCOL   The patient does apply for the [facility] Adult ICU Electrolyte Replacment Protocol based on the criteria listed below:   1.Exclusion criteria: TCTS, ECMO, Dialysis, and Myasthenia Gravis patients 2. Is GFR >/= 30 ml/min? Yes.    Patient's GFR today is >60 3. Is SCr </= 2? Yes.   Patient's SCr is 1.02 mg/dL 4. Did SCr increase >/= 0.5 in 24 hours? No. 5.Pt's weight >40kg  Yes.   6. Abnormal electrolyte(s): potassium 2.9, mag 1.6  7. Electrolytes replaced per protocol 8.  Call MD STAT for K+ </= 2.5, Phos </= 1, or Mag </= 1 Physician:  protocol   Melvern Banker 08/11/2023 3:04 AM

## 2023-08-12 ENCOUNTER — Ambulatory Visit: Payer: No Typology Code available for payment source | Admitting: Cardiology

## 2023-08-12 DIAGNOSIS — E871 Hypo-osmolality and hyponatremia: Secondary | ICD-10-CM | POA: Diagnosis not present

## 2023-08-12 DIAGNOSIS — E43 Unspecified severe protein-calorie malnutrition: Secondary | ICD-10-CM | POA: Diagnosis not present

## 2023-08-12 DIAGNOSIS — I501 Left ventricular failure: Secondary | ICD-10-CM

## 2023-08-12 DIAGNOSIS — I4819 Other persistent atrial fibrillation: Secondary | ICD-10-CM | POA: Diagnosis not present

## 2023-08-12 DIAGNOSIS — R570 Cardiogenic shock: Secondary | ICD-10-CM | POA: Diagnosis not present

## 2023-08-12 DIAGNOSIS — I5023 Acute on chronic systolic (congestive) heart failure: Secondary | ICD-10-CM | POA: Diagnosis not present

## 2023-08-12 DIAGNOSIS — E876 Hypokalemia: Secondary | ICD-10-CM | POA: Diagnosis not present

## 2023-08-12 DIAGNOSIS — R64 Cachexia: Secondary | ICD-10-CM | POA: Diagnosis not present

## 2023-08-12 LAB — BASIC METABOLIC PANEL
Anion gap: 12 (ref 5–15)
Anion gap: 11 (ref 5–15)
BUN: 15 mg/dL (ref 8–23)
BUN: 22 mg/dL (ref 8–23)
CO2: 28 mmol/L (ref 22–32)
CO2: 28 mmol/L (ref 22–32)
Calcium: 8 mg/dL — ABNORMAL LOW (ref 8.9–10.3)
Calcium: 8.4 mg/dL — ABNORMAL LOW (ref 8.9–10.3)
Chloride: 86 mmol/L — ABNORMAL LOW (ref 98–111)
Chloride: 88 mmol/L — ABNORMAL LOW (ref 98–111)
Creatinine, Ser: 0.8 mg/dL (ref 0.61–1.24)
Creatinine, Ser: 0.94 mg/dL (ref 0.61–1.24)
GFR, Estimated: >60 mL/min (ref >60)
GFR, Estimated: >60 mL/min (ref >60)
Glucose, Bld: 239 mg/dL — ABNORMAL HIGH (ref 70–99)
Glucose, Bld: 215 mg/dL — ABNORMAL HIGH (ref 70–99)
Potassium: 3.1 mmol/L — ABNORMAL LOW (ref 3.5–5.1)
Potassium: 4.3 mmol/L (ref 3.5–5.1)
Sodium: 126 mmol/L — ABNORMAL LOW (ref 135–145)
Sodium: 127 mmol/L — ABNORMAL LOW (ref 135–145)

## 2023-08-12 LAB — CBC
HCT: 29.3 % — ABNORMAL LOW (ref 39.0–52.0)
Hemoglobin: 9.3 g/dL — ABNORMAL LOW (ref 13.0–17.0)
MCH: 23.7 pg — ABNORMAL LOW (ref 26.0–34.0)
MCHC: 31.7 g/dL (ref 30.0–36.0)
MCV: 74.6 fL — ABNORMAL LOW (ref 80.0–100.0)
Platelets: 196 10*3/uL (ref 150–400)
RBC: 3.93 MIL/uL — ABNORMAL LOW (ref 4.22–5.81)
RDW: 17.8 % — ABNORMAL HIGH (ref 11.5–15.5)
WBC: 14.1 10*3/uL — ABNORMAL HIGH (ref 4.0–10.5)
nRBC: 0.4 % — ABNORMAL HIGH (ref 0.0–0.2)

## 2023-08-12 LAB — COOXEMETRY PANEL
Carboxyhemoglobin: 2.6 % — ABNORMAL HIGH (ref 0.5–1.5)
Carboxyhemoglobin: 1.2 % (ref 0.5–1.5)
Methemoglobin: <0.7 % (ref 0.0–1.5)
Methemoglobin: <0.7 % (ref 0.0–1.5)
O2 Saturation: 52.3 %
O2 Saturation: 50.4 %
Total hemoglobin: 7.6 g/dL — ABNORMAL LOW (ref 12.0–16.0)
Total hemoglobin: 9.3 g/dL — ABNORMAL LOW (ref 12.0–16.0)

## 2023-08-12 LAB — GLUCOSE, CAPILLARY
Glucose-Capillary: 260 mg/dL — ABNORMAL HIGH (ref 70–99)
Glucose-Capillary: 134 mg/dL — ABNORMAL HIGH (ref 70–99)
Glucose-Capillary: 373 mg/dL — ABNORMAL HIGH (ref 70–99)
Glucose-Capillary: 354 mg/dL — ABNORMAL HIGH (ref 70–99)

## 2023-08-12 LAB — MAGNESIUM: Magnesium: 1.7 mg/dL (ref 1.7–2.4)

## 2023-08-12 LAB — PHOSPHORUS: Phosphorus: 3.9 mg/dL (ref 2.5–4.6)

## 2023-08-12 MED ORDER — OSMOLITE 1.5 CAL PO LIQD
1000.0000 mL | ORAL | Status: DC
  Administered 2023-08-12: 1000 mL
  Filled 2023-08-12 (×2): qty 1000

## 2023-08-12 MED ORDER — ENSURE ENLIVE PO LIQD
237.0000 mL | Freq: Three times a day (TID) | ORAL | Status: DC
  Administered 2023-08-12 – 2023-08-15 (×7): 237 mL via ORAL

## 2023-08-12 MED ORDER — INSULIN ASPART 100 UNIT/ML IJ SOLN
2.0000 [IU] | Freq: Three times a day (TID) | INTRAMUSCULAR | Status: DC
  Administered 2023-08-12: 4 [IU] via SUBCUTANEOUS
  Administered 2023-08-12 – 2023-08-13 (×2): 2 [IU] via SUBCUTANEOUS

## 2023-08-12 MED ORDER — POTASSIUM CHLORIDE 10 MEQ/50ML IV SOLN
10.0000 meq | INTRAVENOUS | Status: AC
  Administered 2023-08-12 (×4): 10 meq via INTRAVENOUS
  Filled 2023-08-12 (×4): qty 50

## 2023-08-12 MED ORDER — ENSURE ENLIVE PO LIQD
237.0000 mL | Freq: Once | ORAL | Status: AC
  Administered 2023-08-12: 237 mL via ORAL

## 2023-08-12 MED ORDER — POTASSIUM CHLORIDE 20 MEQ PO PACK
20.0000 meq | PACK | ORAL | Status: AC
Start: 2023-08-12 — End: 2023-08-12
  Administered 2023-08-12 (×2): 20 meq
  Filled 2023-08-12 (×2): qty 1

## 2023-08-12 MED ORDER — MAGNESIUM SULFATE 2 GM/50ML IV SOLN
2.0000 g | Freq: Once | INTRAVENOUS | Status: AC
  Administered 2023-08-12: 2 g via INTRAVENOUS
  Filled 2023-08-12: qty 50

## 2023-08-12 MED ORDER — TORSEMIDE 20 MG PO TABS
20.0000 mg | ORAL_TABLET | Freq: Every day | ORAL | Status: DC
  Administered 2023-08-13 – 2023-08-14 (×2): 20 mg via ORAL
  Filled 2023-08-12 (×2): qty 1

## 2023-08-12 NOTE — Progress Notes (Signed)
Nutrition Follow-up  DOCUMENTATION CODES:   Severe malnutrition in context of chronic illness  INTERVENTION:   Discussed nutrition poc with Dr. Chestine Spore. Plan to resume trickle TF via Cortrak today. If po intake remains inadequate, recommend resuming TF at goal  Tube Feedings via Cortrak:  Osmolite 1.5 at 20 ml/hr today Goal TF: Osmolite 1.5 at 45 ml/hr with Pro-Source TF20 60 mL daily TF at goal provides 1700 kcals, 88 g of protein, 823 mL of free water  Continue Regular diet  Continue Ensure Enlive po TID, each supplement provides 350 kcal and 20 grams of protein.  Continue daily thiamine supplementation  NUTRITION DIAGNOSIS:   Severe Malnutrition related to chronic illness (CAD, ischemic CM, severe MVR) as evidenced by severe fat depletion, severe muscle depletion.  Continues but being addressed  GOAL:   Patient will meet greater than or equal to 90% of their needs  Not met but being addressed  MONITOR:   PO intake, Supplement acceptance, Labs, Weight trends, Skin, TF tolerance, I & O's  REASON FOR ASSESSMENT:   Consult Assessment of nutrition requirement/status, Enteral/tube feeding initiation and management  ASSESSMENT:   Pt admitted with fatigue, weakness, anorexia, n/v, abdominal cramping d/t cardiogenic shock. PMH significant for CAD s/p CABG, ischemic cardiomyopathy s/p CRT-D,  DM, severe MVR, PAD s/p aortobifemoral bypass, afib.  TF held yesterday due to pt with improved po intake at Breakfast. Cortrak remained in place. Pt ate very little for lunch or dinner yesterday however. Pt ate breakfast again this AM but then did not want to eat lunch; pt did drink some Ensure with encouragement from MD and RN.   Pt previously tolerating TF via Cortrak  Blood sugar management difficult given is inconsistent oral intake. MD adjusting insulin  Noted IV thiamine 100 mg daily added yesterday  Lasix gtt; remains on levophed at 5. Inotrope dependent, on  milrinone  Current wt 53.7 kg; admit wt near 58-59 kg. Net negative almost 7 L with 4.5 L UOP in 24 hours. Moderate edema still present on exam  Labs: sodium 127 (L), Creatinine wdl, BUN wdl, CBGs 134-373, phosphorus wdl, potassium 4.3 wdl, magnesium wdl Meds: IV thiamine, KCl, torsemide, lasix gtt, ss novolog, novolog with meals, levemir   Diet Order:   Diet Order             Diet regular Room service appropriate? Yes; Fluid consistency: Thin; Fluid restriction: 1800 mL Fluid  Diet effective now                   EDUCATION NEEDS:   Education needs have been addressed  Skin:  Skin Assessment: Skin Integrity Issues: Skin Integrity Issues:: Stage II Stage II: bilateral coccyx  Last BM:  11/19  Height:   Ht Readings from Last 1 Encounters:  08/07/23 5\' 6"  (1.676 m)    Weight:   Wt Readings from Last 1 Encounters:  08/12/23 53.7 kg    Ideal Body Weight:     BMI:  Body mass index is 19.11 kg/m.  Estimated Nutritional Needs:   Kcal:  1700-1900  Protein:  85-100g  Fluid:  >/=1.8L   Romelle Starcher MS, RDN, LDN, CNSC Registered Dietitian 3 Clinical Nutrition RD Pager and On-Call Pager Number Located in [place]

## 2023-08-12 NOTE — Progress Notes (Addendum)
Advanced Heart Failure Rounding Note  PCP-Cardiologist: Bryan Lemma, MD   Subjective:   11/20 S/P DC-CV -->SR  Milrinone 0.25 mcg + NE 5 mcg + amio drip 60 mg+ lasix drip 15 mg per hour.   Co-OX 52%, consistent with previous.   Negative 1 liter.   Just walked with PT. Says he feels stronger.     Objective:   Weight Range: 53.7 kg Body mass index is 19.11 kg/m.   Vital Signs:   Temp:  [97.5 F (36.4 C)-98.5 F (36.9 C)] 98.5 F (36.9 C) (11/22 0800) Pulse Rate:  [75-92] 75 (11/22 0745) Resp:  [11-29] 14 (11/22 0745) BP: (87-123)/(46-92) 102/60 (11/22 0745) SpO2:  [81 %-100 %] 99 % (11/22 0745) Weight:  [53.7 kg] 53.7 kg (11/22 0500) Last BM Date : 08/09/23  Weight change: Filed Weights   08/10/23 0630 08/11/23 0500 08/12/23 0500  Weight: 55 kg 55.2 kg 53.7 kg    Intake/Output:   Intake/Output Summary (Last 24 hours) at 08/12/2023 0820 Last data filed at 08/12/2023 0700 Gross per 24 hour  Intake 3030.52 ml  Output 4285 ml  Net -1254.48 ml     CVP 3-4  Physical Exam  General:  Thin, cachetic.  No resp difficulty HEENT: + Cortrak  Neck: supple. no JVD. Carotids 2+ bilat; no bruits. No lymphadenopathy or thryomegaly appreciated. RIJ  Cor: PMI nondisplaced. Regular rate & rhythm. No rubs, gallops. LLSB MR. Lungs: Decreased in the bases Abdomen: soft, nontender, nondistended. No hepatosplenomegaly. No bruits or masses. Good bowel sounds. Extremities: no cyanosis, clubbing, rash, edema Neuro: alert & orientedx3, cranial nerves grossly intact. moves all 4 extremities w/o difficulty. Affect pleasant  Telemetry   SR BiV pacing.  Patient Profile   77 y.o. male with chronic systolic CHF/ICM s/p CRT-D, CAD s/p CABG, severe MR, PAD, PAF, PVCs. Admitted with recurrent Afib with RVR c/b cardiogenic shock.   Assessment/Plan  1. Acute on chronic systolic CHF -> cardiogenic shock: Ischemic cardiomyopathy.  St Jude CRT-D device. TEE in 7/24 showing EF 30-35%,  severe MR with restricted posterior leaflet and small flail segment in posterior leaflet, RV normal, mild AI.  RHC 7/24 with elevated filling pressures and low cardiac output. Echo 07/29/23 EF 35-40% RV moderately reduced severe MR  - Admitted with profound shock in setting of Afib with RVR - Underwent DCCV 11/20 with return of sinus rhythm - CVP down to 3-4. Stop lasix drip. Start torsemide 20 mg daily tomorrow. .  - CO-OX 52% on Norepi 5 mcg + milrinone 0.25 mcg. CO-OX consistent with previous.  - Given baseline functional status and fraility he is not candidate for advanced therapies and severe PAD precludes any form of temporary mechanical support - Bailout mTEER unlikely to provide significant benefit - Palliative Care consulted for GOC discussions.    2. CAD: s/p CABG in 2014. Cath in 7/24 showed patent grafts.   - No s/s angina  - Continue home statin.   3. Mitral regurgitation: TEE (7/24) with severe MR with restricted posterior leaflet and small flail segment in posterior leaflet.  Suspect primarily infarct-related MR, and does appear out of proportion to his LV function and dilation. However, I worry that he is too critically ill to significantly benefit at this time. - Structural Team will consider mTEER as an outpatient if able to come off pressors.   4. Atrial fibrillation, paroxysmal   Has failed amiodarone (concern for lung toxicity, discussed with CCM not sure if true toxicity) as well  as sotalol (break through).   - Continue apixaban 5 mg bid. (He missed one dose PTA). - S/P DCCV 11/20 with conversion to SR.  - Cut back amio to 30 mg daily.  - Replace Mag and K.    5. Shock liver - continue supportive care.  - Improving. - Follow   6. AKI - due to shock, resolved.  - supportive care.    7. PVCs: Frequent.  On amiodarone.    8. PAD: S/p aortobifemoral bypass 2014 then SFA stent 2022.  Followed by Dr. Chestine Spore.  Had left foot ulcerations that have healed.   9.  Hypervolemic hyponatremia: Na 126 - Fluid restrict - Diuresis  10. Anemia: - Hgb 9.3 today. .  - Iron stores low s/p IV iron. -Likely Chronic slow bleeding +/- blood loss from ischemic colitis - Check FOBT  11. Pancolitis: -Noted on CT at admission.  -In setting of profound shock -Completing a week of Zosyn d/t risk of gut translocation.   12. Physical deconditioning: Needs aggressive PT/OT.  - Inpatient rehab recommended at discharge  13. Nutrition: Appetite not great.  -Has Cortrak.   14. GOC DNR   Length of Stay: 6  Amy Clegg, NP  08/12/2023, 8:20 AM  Advanced Heart Failure Team Pager [phone] (M-F; 7a - 5p)  Please contact [facility] for night-coverage after hours (5p -7a ) and weekends on [email or web address]  Agree with above.   Remains on NE 5 and milrinone 0.25  Co-ox and BP marginal CVP low   Denies CP or SOB.   General:  Cachetic frail No resp difficulty HEENT: normal Neck: supple. no JVD. Carotids 2+ bilat; no bruits. No lymphadenopathy or thryomegaly appreciated. Cor: sternal wires visible. Regular rate & rhythm. No rubs, gallops or murmurs. Lungs: decreased throughout  Abdomen: soft, nontender, nondistended. No hepatosplenomegaly. No bruits or masses. Good bowel sounds. Extremities: no cyanosis, clubbing, rash, edema + ulcers Neuro: alert & orientedx3, cranial nerves grossly intact. moves all 4 extremities w/o difficulty. Affect pleasant  He is end-stage. Now maintaining NSR but inotrope dependent. Stop IV lasix.   Wean NE slowly.   Mobilize.   He is not candidate for mTEER.   CRITICAL CARE Performed by: Arvilla Meres  Total critical care time: 45 minutes  Critical care time was exclusive of separately billable procedures and treating other patients.  Critical care was necessary to treat or prevent imminent or life-threatening deterioration.  Critical care was time spent personally by me (independent of midlevel providers or residents) on  the following activities: development of treatment plan with patient and/or surrogate as well as nursing, discussions with consultants, evaluation of patient's response to treatment, examination of patient, obtaining history from patient or surrogate, ordering and performing treatments and interventions, ordering and review of laboratory studies, ordering and review of radiographic studies, pulse oximetry and re-evaluation of patient's condition.  Arvilla Meres, MD  3:35 PM

## 2023-08-12 NOTE — Progress Notes (Signed)
NAME:  [name] [name], MRN:  [id number], DOB:  01-16-1946, LOS: 6 ADMISSION DATE:  08/06/2023, CONSULTATION DATE:  08/06/23 REFERRING MD:  EDP, CHIEF COMPLAINT:  fatigue   History of Present Illness:  77 year old man w/ hx of CAD post CABG, ischemic cardiomyopathy s/p CRT-D, DM, severe MVR, PADs/p aortobifemoral bypass, Afib presenting with increasing fatigue, weakness, anorexia, N/V, abdominal cramping and FTT.  Workup noted for acute liver injury, lactic acidosis, AKI.  Cardiology is consulted for acute on chronic cardiogenic shock, recurrent AF. PCCM to admit.  Pertinent  Medical History   Past Medical History:  Diagnosis Date   Acid reflux    AICD (automatic cardioverter/defibrillator) present    a. 08/2016 St. Jude (serial Number [id number]) biventricular ICD.   Anemia    Chronic systolic CHF (congestive heart failure) (HCC)    a. 08/2016 Echo: EF 20%, diffuse HK, inf, post AK, mod-sev MR, sev dil LA, mod TR, PASP .   COPD (chronic obstructive pulmonary disease) (HCC)    Coronary artery disease involving native coronary artery of native heart with angina pectoris (HCC)    a. 10/2012 MI after aortobifem bypass, found to have multivessel CAD -->CABG x3;  b. 08/2016 Cath: LM 60, LAD 17m, LCX 60ost/p, RCA 100p, VG->RPDA 50p, VG->OM1 ok, LIMA->LAD ok, EF 25%.   Essential hypertension 03/03/2012   History of blood transfusion 11/2012   "during his CABG"   History of gout X 1   History of stomach ulcers 1970s   Hyperlipidemia associated with type 2 diabetes mellitus (HCC) 09/16/2011   Ischemic cardiomyopathy    a. 08/2016 Echo: EF 20%, diffuse HK, inf, post AK;  b. 08/2016 St. Jude (serial Number [id number]) biventricular ICD.   Myocardial infarction [facility])    PAD (peripheral artery disease) (HCC)    s/p aortobifemoral bypass 10/2012   Peripheral neuropathy    PTSD (post-traumatic stress disorder)    Type II diabetes mellitus (HCC)    Ventricular tachycardia (HCC)    a. 08/2016 St.  Jude (serial Number [id number]) biventricular ICD-->oral amio added.   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 Is now on chronic suppressive antibiotics with dicloxacillin   Significant Hospital Events: Including procedures, antibiotic start and stop dates in addition to other pertinent events   11/16 admit, cardiology consulted 11/17 PMT consulted 11/19 DNI/ DNR 11/20 successful DCCV, changed to lasix gtt  Interim History / Subjective:  No events overnight.  Still sleeping intermittently throughout the day/ night per wife and noticed some hallucinations.  Not eating consistently off TF, CBGs 125-300's Up in chair and ambulated in room earlier now sleeping  Objective   Blood pressure (!) 102/52, pulse 77, temperature 98.6 F (37 C), temperature source Oral, resp. rate 12, height 5\' 6"  (1.676 m), weight 53.7 kg, SpO2 97%. CVP:  [0 mmHg-11 mmHg] 3 mmHg      Intake/Output Summary (Last 24 hours) at 08/12/2023 1422 Last data filed at 08/12/2023 1200 Gross per 24 hour  Intake 2576.89 ml  Output 3785 ml  Net -1208.11 ml   Filed Weights   08/10/23 0630 08/11/23 0500 08/12/23 0500  Weight: 55 kg 55.2 kg 53.7 kg   Examination: General:  chronically ill, cachetic appearing thin elderly male in bed in NAD Neuro: sleeping, easily awakens, moves all extremities CV: vpaced PULM:  clear anteriorly, diminished in bases, non labored GI: soft, bs+, ND/ NT, purwick  Extremities: warm/dry, no tibial edema  Skin: no rashes   UOP -4.4L/ 24hrs - 1L/ 24hrs Wts 57.9> 55> 55.2> 53.7kg CVP 6 Labs reviewed> Na 126, K 3.1, sCr 0.8, Mag 1.7, phos 1.7> 3.9, WBC 10.8> 15.5> 14.1, H/H stable Coox 51.8> 43> 58.6> 50.4% (NE 5/ milrinone 0.25)  Resolved problem list:   AKI Shock liver Encephalopathy   Assessment & Plan:   Acute on chronic cardiogenic shock  Acute on chronic  HFrEF due to ischemic cardiomyopathy  CAD s/p CABG St. Jude CRT-D device Severe MR Hypovolemic hyponatremia  AKI, resolved Shock liver, improving Leukocytosis - 11/8 EF 35-40%, RV mod reduced, severe MR - s/p successful DCCV  11/20.  Poor BiV pacing noted, s/p abott device rep w/ improvement in pacing burden P:  - Appreciate HF input - cont NE at 5, do not wean - goal MAP > 65 - continue milrinone per HF, 0.25 (reduced 11/21) - trending CVP/ coox  - holding lasix today, good diuresis thus far - remains paced, no further afib since DCCV 11/20 - GDMT limited due to ongoing shock - 1 week of zosyn for risk of translocation from colitis, completes 11/22.  Slight leukocytosis, better today still felt reactive after DCCV.  BC neg.  Remains afebrile.   - appreciate ongoing PMT assistance, DNR/ DNI as of 11/19  Persistent Afib/RVR on AC PTA - failed recent cardioversion in 06/2023. RVR may have contributed to decompensation then. Was back in Afib in clinic on 11/14-- was referred to ED but didn't go. Previously failed sotalol (didn't work) and amiodarone avoided due to possible lung toxicity-- diagnosed by PFTs and symptoms alone P:  - successful DCCV 11/20, remains AV vs V paced thus far - cont amio per HF.  Decreasing to 30mg  /hr.  Continue despite pulmonary risk, no other options  - cont eliquis - if Afib returns, will likely not tolerate hemodynamically at all and will need ongoing GOC  Severe MVR- was scheduled for mitral clip in Nov 2024, then plan for mitral transcatheter edge to edge repair in 12/24 with Dr. Excell Seltzer - prior intervention postponed due to decompensated state - not an open valve replacement candidate due to debility, advanced heart failure, previous sternal infection- not sure if resected. P: - cardiology considering if candidate for m-TEER after optimization.    Pancolitis on CT- likely ischemic - 7 days of zosyn, to complete  11/22  Hypokalemia Hypomag Hypophos - aggressive replete and trend on BMET BID - replete prn for goal > 4, Mag > 2 - monitor for refeeding syndrome  PAD; previous aorto- bifem bypass - daily statin, eliquis  DM2 - CBG remains liable w/liberalization of diet and not consistent eating, off TF.  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  Consult RD for TF recs with liberalization of diet w/ cortrak - PPI - daily thiamine - s/p IV iron 11/19  Deconditioning & debility - PT, OT  At  risk for ICU delirium - delirium precautions.  Encouraged day/ night routines - prn melatonin  Best Practice (right click and "Reselect all SmartList Selections" daily)   Diet/type: Regular consistency (see orders) DVT prophylaxis: apixaban GI prophylaxis: PPI Lines: Central line Foley:  N/A Code Status:  DNR/ DNI Last date of multidisciplinary goals of care discussion [08/06/23] Wife updated at bedside 11/22 am  CCT: 30 mins   Posey Boyer, MSN, AG-ACNP-BC [facility] 08/12/2023, 2:22 PM  See Amion for pager If no response to pager , please call [phone] until 7pm After 7:00 pm call Elink  [phone]

## 2023-08-12 NOTE — Progress Notes (Addendum)
eLink Physician-Brief Progress Note Patient Name: AHNAF GORMAN DOB: [birth year] MRN: [id number]   Date of Service  08/12/2023  HPI/Events of Note  77 y/o gentleman with a history of HFrEF, ICM, CAD, PAD, pAF who presented with acute decompensated heart failure and recurrent Afib. Cardioverted.  Asked to review insulin orders.  Patient's CBG this morning is 260.  Not eating and not on tube feeds.  Order clearly states to continue with sliding scale insulin but hold scheduled mealtime insulin if the patient has NPO/not eating.  eICU Interventions  Proceed with patient's 8 units of sliding scale.  Per order, hold patient's scheduled mealtime insulin if he is not actually eating a meal. No change in insulin at this time, note diet order in place.     Intervention Category Intermediate Interventions: Hyperglycemia - evaluation and treatment  Sohrab Keelan 08/12/2023, 6:36 AM

## 2023-08-12 NOTE — Progress Notes (Signed)
[facility] ADULT ICU REPLACEMENT PROTOCOL   The patient does apply for the [facility] Adult ICU Electrolyte Replacment Protocol based on the criteria listed below:   1.Exclusion criteria: TCTS, ECMO, Dialysis, and Myasthenia Gravis patients 2. Is GFR >/= 30 ml/min? Yes.    Patient's GFR today is >60 3. Is SCr </= 2? Yes.   Patient's SCr is 0.80 mg/dL 4. Did SCr increase >/= 0.5 in 24 hours? No. 5.Pt's weight >40kg  Yes.   6. Abnormal electrolyte(s): potassium 3.1, mag 1.7  7. Electrolytes replaced per protocol 8.  Call MD STAT for K+ </= 2.5, Phos </= 1, or Mag </= 1 Physician:  protocol  Melvern Banker 08/12/2023 4:04 AM

## 2023-08-12 NOTE — Progress Notes (Signed)
Physical Therapy Treatment Patient Details Name: [name] [name] MRN: [id number] DOB: 05-06-1946 Today's Date: 08/12/2023   History of Present Illness 77 y.o. male admitted 11/16 with fatigue, weakness, N/V, FTT. PT with acute liver injury, lactic acidosis, decompensated heart failure. 11/20 DCCV. PMHx: Multiple recent admissions with heart failure. CAD s/p CABG, ICM s/p CRT-D, DM, severe MVR, PAD s/p aortobifemoral BPG, Afib    PT Comments  Pt pleasant and agreeable to mobility. STS transfers at [facility] progressing to minA with fatigue, constant cueing for upright posturing due to limited trunk control.  Ambulated in room with chair follow and modA assist, heavy cueing to maintain upright posture by weight bearing through UE with RW, heavy cueing to maintan slow gait speed, overall ambulation still slow and unsteady but safer with cueing and guarding this session. SPO2% >92% throughout session on RA.   If plan is discharge home, recommend the following: A lot of help with walking and/or transfers;A lot of help with bathing/dressing/bathroom;Assistance with cooking/housework;Assist for transportation   Can travel by private vehicle     Yes  Equipment Recommendations  None recommended by PT    Recommendations for Other Services       Precautions / Restrictions Precautions Precautions: Fall Restrictions Weight Bearing Restrictions: No     Mobility  Bed Mobility Overal bed mobility: Needs Assistance Bed Mobility: Sit to Supine       Sit to supine: Mod assist   General bed mobility comments: Pt required assistance to elevate LE into bed and guide trunk safely with line management, required mod+2 to slide up to [facility]    Transfers Overall transfer level: Needs assistance Equipment used: Rolling walker (2 wheels) Transfers: Sit to/from Stand, Bed to chair/wheelchair/BSC Sit to Stand: Contact guard assist, Min assist   Step pivot transfers: Mod assist, +2 safety/equipment        General transfer comment: Pt STSx4, able to arise from recliner using handrails without assist, CGA to maintain safety into RW, minA to arise after 2nd STS, modA for step pivot to bed, pt required assistance standing, maintaining upright posture, and facilitating turn to bedside    Ambulation/Gait Ambulation/Gait assistance: Mod assist, +2 safety/equipment Gait Distance (Feet): 10 Feet Assistive device: Rolling walker (2 wheels) Gait Pattern/deviations: Step-to pattern, Knee flexed in stance - right, Knee flexed in stance - left, Knees buckling, Trunk flexed, Decreased step length - right, Decreased step length - left, Decreased stride length   Gait velocity interpretation: <1.31 ft/sec, indicative of household ambulator   General Gait Details: pt required modA with chair follow to ambulate, pt forward flexed into RW, drags LLE behind and will not stay in bounds of RW, ambulated 9ft-10ft-1 step, required seated breaks after all bouts of ambulation for safety as pt was very unstable during gait. Heavy cueing to maintain upright posture by weight bearing through UE with RW, heavy cueing to maintan slow gait speed, overall ambulation still slow and unsteady but safer with cueing and guarding this session.   Stairs             Wheelchair Mobility     Tilt Bed    Modified Rankin (Stroke Patients Only)       Balance Overall balance assessment: Needs assistance Sitting-balance support: Feet unsupported, Bilateral upper extremity supported Sitting balance-Leahy Scale: Poor Sitting balance - Comments: still exhibits poor trunk control would rapidly descend posteriorly without use of arm rest, slides down and out of recliner   Standing balance support: Bilateral upper extremity supported,  During functional activity, Reliant on assistive device for balance Standing balance-Leahy Scale: Poor Standing balance comment: reliant on RW and therapist assist using gait belt to maintain  standing                            Cognition Arousal: Alert Behavior During Therapy: WFL for tasks assessed/performed Overall Cognitive Status: Impaired/Different from baseline Area of Impairment: Safety/judgement, Awareness, Problem solving, Following commands                       Following Commands: Follows one step commands consistently, Follows one step commands with increased time Safety/Judgement: Decreased awareness of safety, Decreased awareness of deficits Awareness: Emergent Problem Solving: Slow processing, Requires verbal cues, Requires tactile cues General Comments: impulsive and quick mover, poor saftey awareness        Exercises General Exercises - Lower Extremity Long Arc Quad: AROM, Both, 10 reps, Seated Hip Flexion/Marching: AROM, Both, 10 reps, Seated    General Comments        Pertinent Vitals/Pain Pain Assessment Pain Assessment: No/denies pain    Home Living                          Prior Function            PT Goals (current goals can now be found in the care plan section) Progress towards PT goals: Progressing toward goals    Frequency    Min 1X/week      PT Plan      Co-evaluation              AM-PAC PT "6 Clicks" Mobility   Outcome Measure  Help needed turning from your back to your side while in a flat bed without using bedrails?: A Little Help needed moving from lying on your back to sitting on the side of a flat bed without using bedrails?: A Little Help needed moving to and from a bed to a chair (including a wheelchair)?: A Little Help needed standing up from a chair using your arms (e.g., wheelchair or bedside chair)?: A Little Help needed to walk in hospital room?: A Lot Help needed climbing 3-5 steps with a railing? : Total 6 Click Score: 15    End of Session Equipment Utilized During Treatment: Gait belt Activity Tolerance: Patient tolerated treatment well Patient left: in  bed;with call bell/phone within reach;with family/visitor present Nurse Communication: Mobility status PT Visit Diagnosis: Unsteadiness on feet (R26.81);Other abnormalities of gait and mobility (R26.89);Muscle weakness (generalized) (M62.81);Difficulty in walking, not elsewhere classified (R26.2)     Time: 6962-9528 PT Time Calculation (min) (ACUTE ONLY): 29 min  Charges:    $Gait Training: 8-22 mins $Therapeutic Activity: 8-22 mins PT General Charges $$ ACUTE PT VISIT: 1 Visit                     Andrey Farmer. SPT Secure chat preferred    Darlin Drop 08/12/2023, 10:20 AM

## 2023-08-12 NOTE — Progress Notes (Signed)
Occupational Therapy Treatment Patient Details Name: [name] [name] MRN: [id number] DOB: [birth year] Today's Date: 08/12/2023   History of present illness 77 y.o. male admitted 11/16 with fatigue, weakness, N/V, FTT. PT with acute liver injury, lactic acidosis, decompensated heart failure. 11/20 DCCV. PMHx: Multiple recent admissions with heart failure. CAD s/p CABG, ICM s/p CRT-D, DM, severe MVR, PAD s/p aortobifemoral BPG, Afib   OT comments  This 77 yo male admitted with above seen today for Bil UE exercises since pt was worn out from being up all morning and just recently finished with PT. Level 2 theraband exercises supine in bed HOB up, 5 reps holding for a count of 3). He tolerated well. We will continue to follow.      If plan is discharge home, recommend the following:  Two people to help with walking and/or transfers;A lot of help with bathing/dressing/bathroom;Assistance with cooking/housework;Assist for transportation;Help with stairs or ramp for entrance   Equipment Recommendations  Other (comment) (TBD next venue)       Precautions / Restrictions Precautions Precautions: Fall Restrictions Weight Bearing Restrictions: No                 Extremity/Trunk Assessment Upper Extremity Assessment Upper Extremity Assessment: Generalized weakness;Right hand dominant RUE Deficits / Details: RUE weaker compared to LUE when doing theraband exercises (level 2)--arm more shaky and decreased control            Vision Baseline Vision/History:  (readers) Patient Visual Report: No change from baseline            Cognition Arousal: Alert Behavior During Therapy: WFL for tasks assessed/performed Overall Cognitive Status: Impaired/Different from baseline Area of Impairment: Following commands                       Following Commands: Follows one step commands with increased time                Exercises Other Exercises Other Exercises: Pt given  Theraband level 2 and was instructed in bed level exercises with theraband tied to bed rails. Reach for feet, reach across body, and reach for ceiling. 5 reps hold for count of 3--pt needed min VCs. Wife present to see them as well and recommended to them that pt do the exercises 3 times a day (5 reps hold count of 3) today -Monday of next week.            Pertinent Vitals/ Pain       Pain Assessment Pain Assessment: No/denies pain         Frequency  Min 1X/week        Progress Toward Goals  OT Goals(current goals can now be found in the care plan section)  Progress towards OT goals: Progressing toward goals  Acute Rehab OT Goals Patient Stated Goal: to rest (I am worn out--up all morning in recliner and had recently finished working with PT) OT Goal Formulation: With patient Time For Goal Achievement: 08/23/23 Potential to Achieve Goals: Good  Plan         AM-PAC OT "6 Clicks" Daily Activity     Outcome Measure   Help from another person eating meals?: A Little Help from another person taking care of personal grooming?: A Little Help from another person toileting, which includes using toliet, bedpan, or urinal?: A Lot Help from another person bathing (including washing, rinsing, drying)?: A Lot Help from another person to put on and taking off regular  upper body clothing?: A Lot Help from another person to put on and taking off regular lower body clothing?: Total 6 Click Score: 13    End of Session    OT Visit Diagnosis: Muscle weakness (generalized) (M62.81);Other symptoms and signs involving cognitive function;Adult, failure to thrive (R62.7)   Activity Tolerance Patient tolerated treatment well   Patient Left in bed;with call bell/phone within reach;with bed alarm set;with family/visitor present           Time: 1025-1036 OT Time Calculation (min): 11 min  Charges: OT General Charges $OT Visit: 1 Visit OT Treatments $Therapeutic Exercise: 8-22  mins  Lindon Romp OT Acute Rehabilitation Services Office [phone]    Evette Georges 08/12/2023, 11:39 AM

## 2023-08-12 NOTE — Progress Notes (Signed)
Pharmacy Antibiotic Note  [name] [name] is a 77 y.o. male admitted on 08/06/2023 with concern for sepsis.  Pharmacy has been consulted for zosyn dosing.  Patient remains afebrile, WBC 14.1. No growth on recent blood cultures. Scr 0.8 (CrCl 60 mL/min). Given concern for ischemic bowel and translocation, will continue Zosyn for 7 days per CCM - scheduled to end after today.   Plan: Continue Zosyn 3.375g IV q 8 h (extended 4h infusion) through 11/22 Monitor renal function, clinical progression and LOT  Height: 5\' 6"  (167.6 cm) Weight: 53.7 kg (118 lb 6.2 oz) IBW/kg (Calculated) : 63.8  Temp (24hrs), Avg:97.8 F (36.6 C), Min:97.5 F (36.4 C), Max:98.5 F (36.9 C)  Recent Labs  Lab 08/06/23 1456 08/06/23 1556 08/06/23 2206 08/07/23 0418 08/08/23 0356 08/09/23 0331 08/09/23 0800 08/09/23 1700 08/10/23 0340 08/10/23 1630 08/11/23 0221 08/11/23 1407 08/12/23 0313  WBC  --   --    < > 13.0* 10.1  --  9.2  --  10.8*  --  15.5*  --  14.1*  CREATININE 1.10  --   --  1.45* 1.26*   < >  --    < > 0.97 0.91 1.02 0.91 0.80  LATICACIDVEN 7.8* 8.7*  --  3.7*  --   --   --   --   --   --   --   --   --    < > = values in this interval not displayed.    Estimated Creatinine Clearance: 60.6 mL/min (by C-G formula based on SCr of 0.8 mg/dL).    Allergies  Allergen Reactions   Lisinopril     Other reaction(s): Cough   Culture Data: 11/16 Blood cx: now growth at 3 days 11/16 MRSA PCR: negative  Antibiotics this Admission: Vancomycin x1 Zosyn 11/16 >> [11/22]  Thank you for allowing pharmacy to participate in this patient's care,  Sherron Monday, PharmD, BCCCP Clinical Pharmacist  Phone: [phone] 08/12/2023 10:38 AM  Please check AMION for all [facility] Pharmacy phone numbers After 10:00 PM, call Main Pharmacy [phone]

## 2023-08-13 DIAGNOSIS — E43 Unspecified severe protein-calorie malnutrition: Secondary | ICD-10-CM | POA: Diagnosis not present

## 2023-08-13 DIAGNOSIS — E876 Hypokalemia: Secondary | ICD-10-CM | POA: Diagnosis not present

## 2023-08-13 DIAGNOSIS — E871 Hypo-osmolality and hyponatremia: Secondary | ICD-10-CM | POA: Diagnosis not present

## 2023-08-13 DIAGNOSIS — I501 Left ventricular failure: Secondary | ICD-10-CM | POA: Diagnosis not present

## 2023-08-13 DIAGNOSIS — I4819 Other persistent atrial fibrillation: Secondary | ICD-10-CM | POA: Diagnosis not present

## 2023-08-13 DIAGNOSIS — R570 Cardiogenic shock: Secondary | ICD-10-CM | POA: Diagnosis not present

## 2023-08-13 DIAGNOSIS — R64 Cachexia: Secondary | ICD-10-CM | POA: Diagnosis not present

## 2023-08-13 DIAGNOSIS — I5023 Acute on chronic systolic (congestive) heart failure: Secondary | ICD-10-CM | POA: Diagnosis not present

## 2023-08-13 DIAGNOSIS — R54 Age-related physical debility: Secondary | ICD-10-CM

## 2023-08-13 LAB — COOXEMETRY PANEL
Carboxyhemoglobin: 2.3 % — ABNORMAL HIGH (ref 0.5–1.5)
Methemoglobin: 0.9 % (ref 0.0–1.5)
O2 Saturation: 66.6 %
Total hemoglobin: 9.7 g/dL — ABNORMAL LOW (ref 12.0–16.0)

## 2023-08-13 LAB — CBC
HCT: 28.7 % — ABNORMAL LOW (ref 39.0–52.0)
Hemoglobin: 9.1 g/dL — ABNORMAL LOW (ref 13.0–17.0)
MCH: 23.9 pg — ABNORMAL LOW (ref 26.0–34.0)
MCHC: 31.7 g/dL (ref 30.0–36.0)
MCV: 75.3 fL — ABNORMAL LOW (ref 80.0–100.0)
Platelets: 218 10*3/uL (ref 150–400)
RBC: 3.81 MIL/uL — ABNORMAL LOW (ref 4.22–5.81)
RDW: 19.5 % — ABNORMAL HIGH (ref 11.5–15.5)
WBC: 16 10*3/uL — ABNORMAL HIGH (ref 4.0–10.5)
nRBC: 0.1 % (ref 0.0–0.2)

## 2023-08-13 LAB — GLUCOSE, CAPILLARY
Glucose-Capillary: 302 mg/dL — ABNORMAL HIGH (ref 70–99)
Glucose-Capillary: 289 mg/dL — ABNORMAL HIGH (ref 70–99)
Glucose-Capillary: 293 mg/dL — ABNORMAL HIGH (ref 70–99)
Glucose-Capillary: 286 mg/dL — ABNORMAL HIGH (ref 70–99)
Glucose-Capillary: 243 mg/dL — ABNORMAL HIGH (ref 70–99)

## 2023-08-13 LAB — BASIC METABOLIC PANEL
Anion gap: 6 (ref 5–15)
Anion gap: 9 (ref 5–15)
BUN: 25 mg/dL — ABNORMAL HIGH (ref 8–23)
BUN: 32 mg/dL — ABNORMAL HIGH (ref 8–23)
CO2: 30 mmol/L (ref 22–32)
CO2: 26 mmol/L (ref 22–32)
Calcium: 8.4 mg/dL — ABNORMAL LOW (ref 8.9–10.3)
Calcium: 8.1 mg/dL — ABNORMAL LOW (ref 8.9–10.3)
Chloride: 91 mmol/L — ABNORMAL LOW (ref 98–111)
Chloride: 91 mmol/L — ABNORMAL LOW (ref 98–111)
Creatinine, Ser: 0.87 mg/dL (ref 0.61–1.24)
Creatinine, Ser: 0.79 mg/dL (ref 0.61–1.24)
GFR, Estimated: >60 mL/min (ref >60)
GFR, Estimated: >60 mL/min (ref >60)
Glucose, Bld: 303 mg/dL — ABNORMAL HIGH (ref 70–99)
Glucose, Bld: 313 mg/dL — ABNORMAL HIGH (ref 70–99)
Potassium: 3.4 mmol/L — ABNORMAL LOW (ref 3.5–5.1)
Potassium: 4 mmol/L (ref 3.5–5.1)
Sodium: 127 mmol/L — ABNORMAL LOW (ref 135–145)
Sodium: 126 mmol/L — ABNORMAL LOW (ref 135–145)

## 2023-08-13 LAB — PHOSPHORUS: Phosphorus: 1.9 mg/dL — ABNORMAL LOW (ref 2.5–4.6)

## 2023-08-13 LAB — MAGNESIUM: Magnesium: 1.9 mg/dL (ref 1.7–2.4)

## 2023-08-13 MED ORDER — MAGNESIUM SULFATE 2 GM/50ML IV SOLN
2.0000 g | Freq: Once | INTRAVENOUS | Status: AC
Start: 2023-08-13 — End: 2023-08-13
  Administered 2023-08-13: 2 g via INTRAVENOUS
  Filled 2023-08-13: qty 50

## 2023-08-13 MED ORDER — INSULIN ASPART 100 UNIT/ML IJ SOLN
0.0000 [IU] | INTRAMUSCULAR | Status: DC
  Administered 2023-08-13: 3 [IU] via SUBCUTANEOUS
  Administered 2023-08-13 (×3): 5 [IU] via SUBCUTANEOUS
  Administered 2023-08-14: 2 [IU] via SUBCUTANEOUS
  Administered 2023-08-14: 9 [IU] via SUBCUTANEOUS

## 2023-08-13 MED ORDER — THIAMINE MONONITRATE 100 MG PO TABS
100.0000 mg | ORAL_TABLET | Freq: Every day | ORAL | Status: DC
  Administered 2023-08-14 – 2023-08-15 (×2): 100 mg via ORAL
  Filled 2023-08-13 (×2): qty 1

## 2023-08-13 MED ORDER — INSULIN ASPART 100 UNIT/ML IJ SOLN
5.0000 [IU] | INTRAMUSCULAR | Status: DC
  Administered 2023-08-13 – 2023-08-14 (×7): 5 [IU] via SUBCUTANEOUS

## 2023-08-13 MED ORDER — PANTOPRAZOLE SODIUM 40 MG PO TBEC
40.0000 mg | DELAYED_RELEASE_TABLET | Freq: Every day | ORAL | Status: DC
  Administered 2023-08-13 – 2023-08-15 (×3): 40 mg via ORAL
  Filled 2023-08-13 (×3): qty 1

## 2023-08-13 MED ORDER — POTASSIUM PHOSPHATES 15 MMOLE/5ML IV SOLN
30.0000 mmol | Freq: Once | INTRAVENOUS | Status: AC
Start: 2023-08-13 — End: 2023-08-13
  Administered 2023-08-13: 30 mmol via INTRAVENOUS
  Filled 2023-08-13: qty 10

## 2023-08-13 MED ORDER — INSULIN ASPART 100 UNIT/ML IJ SOLN
0.0000 [IU] | Freq: Every day | INTRAMUSCULAR | Status: DC
Start: 2023-08-13 — End: 2023-08-14

## 2023-08-13 NOTE — Progress Notes (Signed)
Nutrition Brief Note  RD received page to on-call pager from RN requesting help with ordering 11PM meal/snack for patient per MD request. Team is trying to improve PO intake and specifically want pt to eat something at 11PM. Discussed that RD can order HS snack which would be delivered to unit refrigerator in a bag with patient's information by 8PM and then RN can provide to patient at 11PM as requested by MD.  Reviewed options available on patient's current diet order for HS snack with RN. Per RN ordered chicken salad sandwich on white bread, vanilla Greek yogurt, whole milk, and chilled peaches as HS snack. Entered care order regarding snack coming HS and plan per MD to provide to patient at 11PM.  See RD note from 08/12/23 for full follow-up assessment and recommendations.  Letta Median, MS, RD, LDN, CNSC Pager number available on Amion

## 2023-08-13 NOTE — Progress Notes (Signed)
[facility] ADULT ICU REPLACEMENT PROTOCOL   The patient does apply for the [facility] Adult ICU Electrolyte Replacment Protocol based on the criteria listed below:   1.Exclusion criteria: TCTS, ECMO, Dialysis, and Myasthenia Gravis patients 2. Is GFR >/= 30 ml/min? Yes.    Patient's GFR today is >60 3. Is SCr </= 2? Yes.   Patient's SCr is 0.87 mg/dL 4. Did SCr increase >/= 0.5 in 24 hours? No 5.Pt's weight >40kg  Yes.   6. Abnormal electrolyte(s): potassium 3.4, phos 1.9, mag 1.9  7. Electrolytes replaced per protocol 8.  Call MD STAT for K+ </= 2.5, Phos </= 1, or Mag </= 1 Physician:  protocol  Melvern Banker 08/13/2023 6:07 AM

## 2023-08-13 NOTE — Progress Notes (Signed)
Advanced Heart Failure Rounding Note  PCP-Cardiologist: Bryan Lemma, MD   Subjective:   11/20 S/P DC-CV -->SR  Sitting up in chair. Feels much better. Off NE. On milrinone 0.125 Co-ox 67% CVP 5  Remains in NSR  Not eating much but says he is hungry  No CP or SOB. Eager to go outside.    Objective:   Weight Range: 55.2 kg Body mass index is 19.64 kg/m.   Vital Signs:   Temp:  [97.2 F (36.2 C)-98.8 F (37.1 C)] 98.3 F (36.8 C) (11/23 1110) Pulse Rate:  [72-88] 85 (11/23 1445) Resp:  [11-37] 19 (11/23 1445) BP: (100-127)/(45-76) 123/69 (11/23 1445) SpO2:  [87 %-99 %] 94 % (11/23 1445) Weight:  [55.2 kg] 55.2 kg (11/23 0600) Last BM Date :  (PTA)  Weight change: Filed Weights   08/11/23 0500 08/12/23 0500 08/13/23 0600  Weight: 55.2 kg 53.7 kg 55.2 kg    Intake/Output:   Intake/Output Summary (Last 24 hours) at 08/13/2023 1523 Last data filed at 08/13/2023 1400 Gross per 24 hour  Intake 2539.23 ml  Output 2600 ml  Net -60.77 ml       Physical Exam  General:  Thin, cachetic.  No resp difficulty HEENT: + Cortrak  Neck: supple. no JVD. Carotids 2+ bilat; no bruits. No lymphadenopathy or thryomegaly appreciated. Cor: PMI nondisplaced. Regular rate & rhythm. No rubs, gallops or murmurs. Lungs: decreased throughout Abdomen: soft, nontender, nondistended. No hepatosplenomegaly. No bruits or masses. Good bowel sounds. Extremities: no cyanosis, clubbing, rash, edema Neuro: alert & orientedx3, cranial nerves grossly intact. moves all 4 extremities w/o difficulty. Affect pleasant  Telemetry   SR BiV pacing 80s Personally reviewed  Patient Profile   77 y.o. male with chronic systolic CHF/ICM s/p CRT-D, CAD s/p CABG, severe MR, PAD, PAF, PVCs. Admitted with recurrent Afib with RVR c/b cardiogenic shock.   Assessment/Plan  1. Acute on chronic systolic CHF -> cardiogenic shock: Ischemic cardiomyopathy.  St Jude CRT-D device. TEE in 7/24 showing EF 30-35%,  severe MR with restricted posterior leaflet and small flail segment in posterior leaflet, RV normal, mild AI.  RHC 7/24 with elevated filling pressures and low cardiac output. Echo 07/29/23 EF 35-40% RV moderately reduced severe MR  - Admitted with profound shock in setting of Afib with RVR - Underwent DCCV 11/20 with return of sinus rhythm - CVP remains low - Off NE. On milrinone 0.125 mcg. CO-OX 67%. Stop milrinone today - Given baseline functional status and fraility he is not candidate for advanced therapies and severe PAD precludes any form of temporary mechanical support - Bailout mTEER unlikely to provide significant benefit - Palliative Care consulted for GOC discussions.    2. CAD: s/p CABG in 2014. Cath in 7/24 showed patent grafts.   - No s/s angina - Continue home statin.   3. Mitral regurgitation: TEE (7/24) with severe MR with restricted posterior leaflet and small flail segment in posterior leaflet.  Suspect primarily infarct-related MR, and does appear out of proportion to his LV function and dilation. However, I worry that he is too critically ill to significantly benefit at this time. - Structural Team will consider mTEER as an outpatient if able to come off pressors.   4. Atrial fibrillation, paroxysmal   Has failed amiodarone (concern for lung toxicity, discussed with CCM not sure if true toxicity) as well as sotalol (break through).   - Continue apixaban 5 mg bid. (He missed one dose PTA). - S/P DCCV 11/20  with conversion to SR.  - Continue amio at 30/hr - Replace Mag and K.    5. Shock liver - continue supportive care.  - Improving. - Follow   6. AKI - due to shock, resolved.  - supportive care.    7. PVCs: Frequent.  On amiodarone.    8. PAD: S/p aortobifemoral bypass 2014 then SFA stent 2022.  Followed by Dr. Chestine Spore.  Had left foot ulcerations that have healed.   9. Hypervolemic hyponatremia: Na 126 -> 127 - Fluid restrict - Diuresis  10. Iron def  Anemia: - Hgb 9.1 today. .  - Iron stores low s/p IV iron. 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 Pancolitis: -Noted on CT at admission.  -In setting of profound shock -Completing a week of Zosyn d/t risk of gut translocation.   12. Physical deconditioning: Needs aggressive PT/OT.  - Inpatient rehab recommended at discharge  13. Nutrition: Appetite not great.  -Has Cortrak.   14. Hypokalemia - supp  15. GOC DNR   Length of Stay: 7  Arvilla Meres, MD  08/13/2023, 3:23 PM  Advanced Heart Failure Team Pager [phone] (M-F; 7a - 5p)  Please contact [facility] for night-coverage after hours (5p -7a ) and weekends on [email or web address]

## 2023-08-13 NOTE — Progress Notes (Signed)
NAME:  [name] [name], MRN:  [id number], DOB:  [birth year], LOS: 7 ADMISSION DATE:  08/06/2023, CONSULTATION DATE:  08/06/23 REFERRING MD:  EDP, CHIEF COMPLAINT:  fatigue   History of Present Illness:  77 year old man w/ hx of CAD post CABG, ischemic cardiomyopathy s/p CRT-D, DM, severe MVR, PADs/p aortobifemoral bypass, Afib presenting with increasing fatigue, weakness, anorexia, N/V, abdominal cramping and FTT.  Workup noted for acute liver injury, lactic acidosis, AKI.  Cardiology is consulted for acute on chronic cardiogenic shock, recurrent AF. PCCM to admit.  Pertinent  Medical History   Past Medical History:  Diagnosis Date   Acid reflux    AICD (automatic cardioverter/defibrillator) present    a. 08/2016 St. Jude (serial Number [id number]) biventricular ICD.   Anemia    Chronic systolic CHF (congestive heart failure) (HCC)    a. 08/2016 Echo: EF 20%, diffuse HK, inf, post AK, mod-sev MR, sev dil LA, mod TR, PASP .   COPD (chronic obstructive pulmonary disease) (HCC)    Coronary artery disease involving native coronary artery of native heart with angina pectoris (HCC)    a. 10/2012 MI after aortobifem bypass, found to have multivessel CAD -->CABG x3;  b. 08/2016 Cath: LM 60, LAD 59m, LCX 60ost/p, RCA 100p, VG->RPDA 50p, VG->OM1 ok, LIMA->LAD ok, EF 25%.   Essential hypertension 03/03/2012   History of blood transfusion 11/2012   "during his CABG"   History of gout X 1   History of stomach ulcers 1970s   Hyperlipidemia associated with type 2 diabetes mellitus (HCC) 09/16/2011   Ischemic cardiomyopathy    a. 08/2016 Echo: EF 20%, diffuse HK, inf, post AK;  b. 08/2016 St. Jude (serial Number [id number]) biventricular ICD.   Myocardial infarction [facility])    PAD (peripheral artery disease) (HCC)    s/p aortobifemoral bypass 10/2012   Peripheral neuropathy    PTSD (post-traumatic stress disorder)    Type II diabetes mellitus (HCC)    Ventricular tachycardia (HCC)    a. 08/2016 St.  Jude (serial Number [id number]) biventricular ICD-->oral amio added.   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 Is now on chronic suppressive antibiotics with dicloxacillin   Significant Hospital Events: Including procedures, antibiotic start and stop dates in addition to other pertinent events   11/16 admit, cardiology consulted 11/17 PMT consulted 11/19 DNI/ DNR 11/20 successful DCCV, changed to lasix gtt  Interim History / Subjective:  [name] [name] is feeling well.  He slept well yesterday after working with occupational therapy.  He does not eat much for lunch but was able to eat some later in the day.  Objective   Blood pressure 121/60, pulse 84, temperature 98.3 F (36.8 C), temperature source Oral, resp. rate 17, height 5\' 6"  (1.676 m), weight 55.2 kg, SpO2 93%. CVP:  [0 mmHg-10 mmHg] 5 mmHg      Intake/Output Summary (Last 24 hours) at 08/13/2023 1121 Last data filed at 08/13/2023 2440 Gross per 24 hour  Intake 2127.81 ml  Output 2750 ml  Net -622.19 ml   Filed Weights   08/11/23 0500 08/12/23 0500 08/13/23 0600  Weight: 55.2 kg 53.7 kg 55.2 kg   Examination: General: Chronically ill-appearing man sitting up in the recliner no acute distress talking to his daughter Neuro: Awake and alert, interactive, answering questions appropriately.  Having all extremities. CV: Paced rhythm, systolic murmur PULM:   breathing comfortably on nasal  cannula, no wheezing or rales GI: Soft, nontender Extremities: Stasis dermatitis on shins, no significant edema Skin: Warm, dry, no rashes  Coox 66% Na+ 127 K+ 3.4 BUN 25 Cr 0.84 WBC 16 H/H 9.1/28.7 Platelets 218    Resolved problem list:   AKI Shock liver Encephalopathy   Assessment & Plan:   Acute on chronic cardiogenic shock  Acute on chronic HFrEF due to ischemic cardiomyopathy  CAD s/p CABG St.  Jude CRT-D device Severe MR Hypovolemic hyponatremia  AKI, resolved Shock liver, improving Leukocytosis - 11/8 EF 35-40%, RV mod reduced, severe MR - s/p successful DCCV  11/20.  Poor BiV pacing noted, s/p abott device rep w/ improvement in pacing burden P:  -Was able to be weaned off norepinephrine, continue milrinone - Continue telemetry monitoring - Monitor electrolytes and replete as needed - Continues in paced rhythm, appreciate EP's adjustments to his pacemaker settings. - Serial cooximeter he, CVP monitoring - Guideline directed medical therapy limited with ongoing shock, but as blood pressure improves may be able to tolerate  Persistent Afib/RVR on AC PTA - failed recent cardioversion in 06/2023. RVR may have contributed to decompensation then. Was back in Afib in clinic on 11/14-- was referred to ED but didn't go. Previously failed sotalol (didn't work) and amiodarone avoided due to possible lung toxicity-- diagnosed by PFTs and symptoms alone P:  -Continue amiodarone and Eliquis - Telemetry monitoring - Monitor electrolytes and replete as needed -Does not tolerate A-fib and if he is unable to stay in sinus rhythm he is likely to decompensate again  Severe MR- was scheduled for mitral clip in Nov 2024, moved to 12/24 with Dr. Excell Seltzer -Question his ability to tolerate procedure and benefit with such severe cardiomyopathy   Pancolitis on CT- likely ischemic -Completed 7 days of Zosyn  Hypokalemia Hypomag Hypophos -Monitor electrolytes and replete as needed, encourage p.o. intake  PAD; previous aorto- bifem bypass -Atorvastatin, Eliquis  DM2 with uncontrolled hyperglycemia.  Variable p.o. intake and need for tube feeds has made this hard to manage. -Detemir 10 units twice daily -Adding tube feeding coverage since this is the most predictable part of his intake, 5 units every 4 hours with hold parameters -Sliding scale -Adding mealtime coverage if he is able to eat and  evening meal late in the day, which his daughter has been trying to encourage - Goal blood glucose 140-180  COPD Ongoing tobacco abuse Pulmonary edema  - not on PTA inhalers P:  -Bronchodilators as needed - Recommend smoking cessation -Nicotine replacement therapy  Acute metabolic encephalopathy, ICU delirium - Frequent reorientation - Have stressed multiple times the importance of day night orientation and sleeping at night; specifically avoiding napping all day -Correct electrolytes  H/o PUD Acute on chronic anemia/ IDA 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 intake, tube feeds  Deconditioning & debility Resulting Cachexia Severe protein energy malnutrition -PT, OT -Encouraging p.o. intake   Updated at bedside with his daughter and wife.  Best Practice (right click and "Reselect all SmartList Selections" daily)   Diet/type: Regular consistency (see orders) DVT prophylaxis: apixaban GI prophylaxis: PPI Lines: Central line Foley:  N/A Code Status:  DNR/ DNI Last date of multidisciplinary goals of care discussion [08/06/23] Wife updated at bedside 11/23 am   Steffanie Dunn, DO 08/13/23 5:33 PM [facility]  For contact information, see Amion. If no response to pager, please call PCCM consult pager. After hours, 7PM- 7AM, please call Elink.

## 2023-08-14 ENCOUNTER — Inpatient Hospital Stay (HOSPITAL_COMMUNITY): Payer: Medicare Other

## 2023-08-14 DIAGNOSIS — R64 Cachexia: Secondary | ICD-10-CM | POA: Diagnosis not present

## 2023-08-14 DIAGNOSIS — I501 Left ventricular failure: Secondary | ICD-10-CM | POA: Diagnosis not present

## 2023-08-14 DIAGNOSIS — E43 Unspecified severe protein-calorie malnutrition: Secondary | ICD-10-CM | POA: Diagnosis not present

## 2023-08-14 DIAGNOSIS — I5021 Acute systolic (congestive) heart failure: Secondary | ICD-10-CM | POA: Diagnosis not present

## 2023-08-14 DIAGNOSIS — E876 Hypokalemia: Secondary | ICD-10-CM | POA: Diagnosis not present

## 2023-08-14 DIAGNOSIS — I4819 Other persistent atrial fibrillation: Secondary | ICD-10-CM | POA: Diagnosis not present

## 2023-08-14 DIAGNOSIS — Z4682 Encounter for fitting and adjustment of non-vascular catheter: Secondary | ICD-10-CM | POA: Diagnosis not present

## 2023-08-14 DIAGNOSIS — R570 Cardiogenic shock: Secondary | ICD-10-CM | POA: Diagnosis not present

## 2023-08-14 DIAGNOSIS — R918 Other nonspecific abnormal finding of lung field: Secondary | ICD-10-CM | POA: Diagnosis not present

## 2023-08-14 DIAGNOSIS — I5023 Acute on chronic systolic (congestive) heart failure: Secondary | ICD-10-CM | POA: Diagnosis not present

## 2023-08-14 DIAGNOSIS — I517 Cardiomegaly: Secondary | ICD-10-CM | POA: Diagnosis not present

## 2023-08-14 DIAGNOSIS — E871 Hypo-osmolality and hyponatremia: Secondary | ICD-10-CM | POA: Diagnosis not present

## 2023-08-14 LAB — PHOSPHORUS: Phosphorus: 2.1 mg/dL — ABNORMAL LOW (ref 2.5–4.6)

## 2023-08-14 LAB — BASIC METABOLIC PANEL
Anion gap: 10 (ref 5–15)
BUN: 30 mg/dL — ABNORMAL HIGH (ref 8–23)
CO2: 29 mmol/L (ref 22–32)
Calcium: 8.6 mg/dL — ABNORMAL LOW (ref 8.9–10.3)
Chloride: 90 mmol/L — ABNORMAL LOW (ref 98–111)
Creatinine, Ser: 0.82 mg/dL (ref 0.61–1.24)
GFR, Estimated: >60 mL/min (ref >60)
Glucose, Bld: 128 mg/dL — ABNORMAL HIGH (ref 70–99)
Potassium: 3.8 mmol/L (ref 3.5–5.1)
Sodium: 129 mmol/L — ABNORMAL LOW (ref 135–145)

## 2023-08-14 LAB — COOXEMETRY PANEL
Carboxyhemoglobin: 1.5 % (ref 0.5–1.5)
Carboxyhemoglobin: 2.2 % — ABNORMAL HIGH (ref 0.5–1.5)
Methemoglobin: 0.8 % (ref 0.0–1.5)
Methemoglobin: <0.7 % (ref 0.0–1.5)
O2 Saturation: 47.2 %
O2 Saturation: 51.3 %
Total hemoglobin: 9.6 g/dL — ABNORMAL LOW (ref 12.0–16.0)
Total hemoglobin: 9.7 g/dL — ABNORMAL LOW (ref 12.0–16.0)

## 2023-08-14 LAB — CBC
HCT: 28.2 % — ABNORMAL LOW (ref 39.0–52.0)
Hemoglobin: 9.1 g/dL — ABNORMAL LOW (ref 13.0–17.0)
MCH: 24.3 pg — ABNORMAL LOW (ref 26.0–34.0)
MCHC: 32.3 g/dL (ref 30.0–36.0)
MCV: 75.4 fL — ABNORMAL LOW (ref 80.0–100.0)
Platelets: 206 10*3/uL (ref 150–400)
RBC: 3.74 MIL/uL — ABNORMAL LOW (ref 4.22–5.81)
RDW: 20.3 % — ABNORMAL HIGH (ref 11.5–15.5)
WBC: 18.3 10*3/uL — ABNORMAL HIGH (ref 4.0–10.5)
nRBC: 0.2 % (ref 0.0–0.2)

## 2023-08-14 LAB — MAGNESIUM: Magnesium: 1.8 mg/dL (ref 1.7–2.4)

## 2023-08-14 LAB — GLUCOSE, CAPILLARY
Glucose-Capillary: 119 mg/dL — ABNORMAL HIGH (ref 70–99)
Glucose-Capillary: 155 mg/dL — ABNORMAL HIGH (ref 70–99)
Glucose-Capillary: 353 mg/dL — ABNORMAL HIGH (ref 70–99)

## 2023-08-14 MED ORDER — FUROSEMIDE 10 MG/ML IJ SOLN
80.0000 mg | Freq: Two times a day (BID) | INTRAMUSCULAR | Status: DC
  Administered 2023-08-14 – 2023-08-15 (×2): 80 mg via INTRAVENOUS
  Filled 2023-08-14 (×2): qty 8

## 2023-08-14 MED ORDER — VANCOMYCIN HCL 1250 MG/250ML IV SOLN: 1250.0000 mg | INTRAVENOUS | Status: DC

## 2023-08-14 MED ORDER — PIPERACILLIN-TAZOBACTAM 3.375 G IVPB: 3.3750 g | Freq: Three times a day (TID) | INTRAVENOUS | Status: DC

## 2023-08-14 MED ORDER — POTASSIUM PHOSPHATES 15 MMOLE/5ML IV SOLN
15.0000 mmol | Freq: Once | INTRAVENOUS | Status: AC
Start: 2023-08-14 — End: 2023-08-14
  Administered 2023-08-14: 15 mmol via INTRAVENOUS
  Filled 2023-08-14: qty 5

## 2023-08-14 MED ORDER — MAGNESIUM SULFATE 2 GM/50ML IV SOLN
2.0000 g | Freq: Once | INTRAVENOUS | Status: AC
Start: 2023-08-14 — End: 2023-08-14
  Administered 2023-08-14: 2 g via INTRAVENOUS
  Filled 2023-08-14: qty 50

## 2023-08-14 MED ORDER — POTASSIUM & SODIUM PHOSPHATES 280-160-250 MG PO PACK
1.0000 | PACK | Freq: Three times a day (TID) | ORAL | Status: DC
  Administered 2023-08-14 – 2023-08-15 (×5): 1 via ORAL
  Filled 2023-08-14 (×8): qty 1

## 2023-08-14 MED ORDER — VANCOMYCIN HCL 1750 MG/350ML IV SOLN
1750.0000 mg | Freq: Once | INTRAVENOUS | Status: DC
  Administered 2023-08-14: 1750 mg via INTRAVENOUS
  Filled 2023-08-14: qty 350

## 2023-08-14 MED ORDER — FUROSEMIDE 10 MG/ML IJ SOLN
80.0000 mg | Freq: Once | INTRAMUSCULAR | Status: AC
  Administered 2023-08-14: 80 mg via INTRAVENOUS
  Filled 2023-08-14: qty 8

## 2023-08-14 NOTE — Progress Notes (Signed)
   Family meeting at bedside with patient, family and CCM  We discussed worsening trajectory.  Patient clear that he would like to go home.  Will do our best to optimize today. Increase milrinone back to 0,25. Increase lasix to 80IV bid.   Will working on arranging home palliative services for tomorrow.   Will continue milrinone until right before d/c then can stop.   Arvilla Meres, MD  1:27 PM

## 2023-08-14 NOTE — Progress Notes (Addendum)
Advanced Heart Failure Rounding Note  PCP-Cardiologist: Bryan Lemma, MD   Subjective:   11/20 S/P DC-CV -->SR  Milrinone dropped to 0.125 yesterday. NE remains off. Co-ox 51% CVP 7-8  Remains in NSR  RN reports patient had rough night. Says he felt like he wanted to give up.   This morning says he feels ok. Seems in better spirits. Appears more SOB but denies dyspnea. CXR with diffuse edema  WBC 14 -> 16 -> 18  Objective:   Weight Range: 57 kg Body mass index is 20.28 kg/m.   Vital Signs:   Temp:  [97.6 F (36.4 C)-98.5 F (36.9 C)] 97.6 F (36.4 C) (11/24 0830) Pulse Rate:  [77-114] 83 (11/24 0930) Resp:  [14-39] 21 (11/24 0930) BP: (79-138)/(47-105) 115/57 (11/24 0930) SpO2:  [83 %-100 %] 98 % (11/24 0930) Weight:  [57 kg] 57 kg (11/24 0600) Last BM Date :  (PTA)  Weight change: Filed Weights   08/12/23 0500 08/13/23 0600 08/14/23 0600  Weight: 53.7 kg 55.2 kg 57 kg    Intake/Output:   Intake/Output Summary (Last 24 hours) at 08/14/2023 1003 Last data filed at 08/14/2023 0900 Gross per 24 hour  Intake 1875.17 ml  Output 2500 ml  Net -624.83 ml     Medications  apixaban  5 mg Oral BID   atorvastatin  40 mg Oral Daily   Chlorhexidine Gluconate Cloth  6 each Topical Daily   feeding supplement  237 mL Oral TID BM   feeding supplement (PROSource TF20)  60 mL Per Tube Daily   insulin aspart  0-5 Units Subcutaneous QHS   insulin aspart  0-9 Units Subcutaneous Q4H   insulin aspart  5 Units Subcutaneous Q4H   insulin detemir  10 Units Subcutaneous BID   multivitamins with iron  1 tablet Oral Daily   nicotine  21 mg Transdermal Daily   pantoprazole  40 mg Oral Daily   sodium chloride flush  10 mL Intracatheter Q12H   thiamine  100 mg Oral Daily   torsemide  20 mg Oral Daily    amiodarone 30 mg/hr (08/14/23 0900)   feeding supplement (OSMOLITE 1.5 CAL) 20 mL/hr at 08/14/23 0900   milrinone 0.125 mcg/kg/min (08/14/23 0900)   norepinephrine  (LEVOPHED) Adult infusion 0 mcg/min (08/13/23 1319)   potassium PHOSPHATE IVPB (in mmol) 43 mL/hr at 08/14/23 0900      Physical Exam  General:  Thin, cachetic.  + tachypneic HEENT: + Cortrak  Neck: supple. JVP 8.  Cor: sternal wires visible Regular rate & rhythm. No rubs, gallops or murmurs. Lungs: coarse throughout R>L decreased BS Abdomen: soft, nontender, nondistended. No hepatosplenomegaly. No bruits or masses. Good bowel sounds. Extremities: no cyanosis, clubbing, rash, edema Neuro: alert & orientedx3, cranial nerves grossly intact. moves all 4 extremities w/o difficulty. Affect pleasant  Telemetry   SR BiV pacing 80s Personally reviewed  Patient Profile   77 y.o. male with chronic systolic CHF/ICM s/p CRT-D, CAD s/p CABG, severe MR, PAD, PAF, PVCs. Admitted with recurrent Afib with RVR c/b cardiogenic shock.   Assessment/Plan   1. Acute on chronic systolic CHF -> cardiogenic shock: Ischemic cardiomyopathy.  St Jude CRT-D device. TEE in 7/24 showing EF 30-35%, severe MR with restricted posterior leaflet and small flail segment in posterior leaflet, RV normal, mild AI.  RHC 7/24 with elevated filling pressures and low cardiac output. Echo 07/29/23 EF 35-40% RV moderately reduced severe MR  - Admitted with profound shock in setting of Afib with  RVR - Underwent DCCV 11/20 with return of sinus rhythm - Milrinone turned downt o 0.125 yesterday.   CO-OX 67% -> 51%. Will continue - Volume status worse today on torsemide 20 w/ pulmonary edema on CXR. Give lasix 80mg  IV x 1 - Given baseline functional status and fraility he is not candidate for advanced therapies and severe PAD precludes any form of temporary mechanical support - Bailout mTEER unlikely to provide significant benefit - Palliative Care following for GOC discussions.  - He improved initially with DC-CV but now seems like he may be milrinone dependent. Will continue attempts to wean as not likely candidate for home  inotropes. Suspect we will need to consider transition to comfort care   2. CAD: s/p CABG in 2014. Cath in 7/24 showed patent grafts.   - No s/s angina - Continue home statin.   3. Mitral regurgitation: TEE (7/24) with severe MR with restricted posterior leaflet and small flail segment in posterior leaflet.  Suspect primarily infarct-related MR, and does appear out of proportion to his LV function and dilation. However, I worry that he is too critically ill to significantly benefit at this time. - Structural Team will consider mTEER as an outpatient if able to come off pressors.   4. Atrial fibrillation, paroxysmal   Has failed amiodarone (concern for lung toxicity, discussed with CCM not sure if true toxicity) as well as sotalol (break through).   - Continue apixaban 5 mg bid.  - S/P DCCV 11/20 with conversion to SR.  - Continue amio at 30/hr While on milrinone - Replace Mag and K.   5. Acute on chronic hypoxic resp failure - worse today in setting of pulmonary edema. -> IV lasix - WBC also climbing will discuss need for empiric abx with CCM but  may be more likely ischemic bowel (completed Zosyn 11/22)   6. Shock liver - resolved   7. AKI - due to shock, resolved.  - supportive care.    8. PVCs: Frequent.  On amiodarone.    9. PAD: S/p aortobifemoral bypass 2014 then SFA stent 2022.  Followed by Dr. Chestine Spore.  Had left foot ulcerations that have healed.   10. Hypervolemic hyponatremia: Na 126 -> 127-> 129 - Fluid restrict - Diuresis  11. Iron def Anemia: - Hgb 9.1 today. .  - Has rec'd IV iron   12. Pancolitis: -Noted on CT at admission.  -In setting of profound shock -Completed Zosyn on 11/22  13. Physical deconditioning:  - PT/OT  14. Nutrition: Appetite not great.  -Has Cortrak.   14. Hypokalemia - supp  15. GOC - DNR/DNI  CRITICAL CARE Performed by: Arvilla Meres  Total critical care time: 40 minutes  Critical care time was exclusive of separately  billable procedures and treating other patients.  Critical care was necessary to treat or prevent imminent or life-threatening deterioration.  Critical care was time spent personally by me (independent of midlevel providers or residents) on the following activities: development of treatment plan with patient and/or surrogate as well as nursing, discussions with consultants, evaluation of patient's response to treatment, examination of patient, obtaining history from patient or surrogate, ordering and performing treatments and interventions, ordering and review of laboratory studies, ordering and review of radiographic studies, pulse oximetry and re-evaluation of patient's condition.  Length of Stay: 8  Arvilla Meres, MD  08/14/2023, 10:03 AM  Advanced Heart Failure Team Pager [phone] (M-F; 7a - 5p)  Please contact [facility] for night-coverage after hours (5p -7a )  and weekends on [email or web address]

## 2023-08-14 NOTE — Progress Notes (Signed)
   Palliative Medicine Inpatient Follow Up Note  Chart review completed for [name].  Conversations held with CCM, Dr. Chestine Spore and Advanced Heart Failure, Dr. Gala Romney.   Plan for transition home with hospice services.  No Charge ______________________________________________________________________________________ Lamarr Lulas [place] Palliative Medicine Team Team Cell Phone: [phone] Please utilize secure chat with additional questions, if there is no response within 30 minutes please call the above phone number  Palliative Medicine Team providers are available by phone from 7am to 7pm daily and can be reached through the team cell phone.  Should this patient require assistance outside of these hours, please call the patient's attending physician.

## 2023-08-14 NOTE — TOC Progression Note (Signed)
Transition of Care [facility]) - Progression Note    Patient Details  Name: [name] [name] MRN: [id number] Date of Birth: [birth year]  Transition of [facility]) CM/SW Contact  Lawerance Sabal, RN Phone Number: 08/14/2023, 1:27 PM  Clinical Narrative:     Requested by MD to speak w patient and family and set up home hospice services  Spoke at bedside with patient, wife, and daughter Rene Kocher. Rene Kocher, supportive to her mother, states that she will be the main point of contact for family.   Patient is active w [facility] and Hospice and they would like to continue services with them for hospice care.   Patient will need DME set up at the home before DC. They are clearing a spot for the hospital bed. I have requested bed, hoyer, WC, oxygen. Patient will need PTAR.   I have informed MD that the family would like for Foley catheter to be left in place.   Expected Discharge Plan: Home w Hospice Care Barriers to Discharge: Equipment Delay  Expected Discharge Plan and Services   Discharge Planning Services: CM Consult   Living arrangements for the past 2 months: Single Family Home                             HH Agency: [facility] Date [facility] Agency Contacted: 08/14/23 Time HH Agency Contacted: 1327 Representative spoke with at [facility] Agency: [facility] [place] [facility]   Social Determinants of Health (SDOH) Interventions SDOH Screenings   Food Insecurity: No Food Insecurity (08/08/2023)  Housing: Low Risk  (08/08/2023)  Transportation Needs: No Transportation Needs (08/08/2023)  Utilities: Not At Risk (08/08/2023)  Tobacco Use: High Risk (08/06/2023)    Readmission Risk Interventions     No data to display

## 2023-08-14 NOTE — IPAL (Signed)
  Interdisciplinary Goals of Care Family Meeting   Date carried out:: 08/14/2023  Location of the meeting: Bedside  Member's involved: Physician, Bedside Registered Nurse, Family Member or next of kin, and Other: Cardiologist  Durable Power of Attorney or acting medical decision maker: self    Discussion: We discussed goals of care for [name] [name] .  Mr. Danowski understands that his heart failure is end stage, and at this point his priority is going home and being comfortable. He understands that once milrinone is stopped his symptoms will be worse and he will require meds to treat his symptoms. His wife and daughter are at bedside and support his decision.Today we will try to diurese him as much as possible to optimize him and focus on his comfort rather than uncomfortable care measures that  are not going to change his eventual outcome.  Code status: Full DNR  Disposition: Continue current acute care   Time spent for the meeting: 10 min  Steffanie Dunn 08/14/2023, 12:48 PM

## 2023-08-14 NOTE — Progress Notes (Signed)
[facility] ADULT ICU REPLACEMENT PROTOCOL   The patient does apply for the [facility] Adult ICU Electrolyte Replacment Protocol based on the criteria listed below:   1.Exclusion criteria: TCTS, ECMO, Dialysis, and Myasthenia Gravis patients 2. Is GFR >/= 30 ml/min? Yes.    Patient's GFR today is >60 3. Is SCr </= 2? Yes.   Patient's SCr is 0.82 mg/dL 4. Did SCr increase >/= 0.5 in 24 hours? No. 5.Pt's weight >40kg  Yes.   6. Abnormal electrolyte(s): Phos 2.1, Mag 1.8  7. Electrolytes replaced per protocol 8.  Call MD STAT for K+ </= 2.5, Phos </= 1, or Mag </= 1 Physician:  Dr. Henry Russel  Lolita Lenz 08/14/2023 4:54 AM

## 2023-08-14 NOTE — Progress Notes (Signed)
SLP Cancellation Note  Patient Details Name: [name] [name] MRN: [id number] DOB: 1946-09-03   Cancelled treatment:       Reason Eval/Treat Not Completed: Other (comment). Per RN, goal of care discussion complete and patient has decided for any pos with known risk of aspiration, no need for swallow evaluation. Re-consult as needed.   Ferdinand Lango MA, CCC-SLP    [name] 08/14/2023, 1:01 PM

## 2023-08-14 NOTE — Progress Notes (Signed)
NAME:  [name] [name], MRN:  [id number], DOB:  September 18, 1946, LOS: 8 ADMISSION DATE:  08/06/2023, CONSULTATION DATE:  08/06/23 REFERRING MD:  EDP, CHIEF COMPLAINT:  fatigue   History of Present Illness:  77 year old man w/ hx of CAD post CABG, ischemic cardiomyopathy s/p CRT-D, DM, severe MVR, PADs/p aortobifemoral bypass, Afib presenting with increasing fatigue, weakness, anorexia, N/V, abdominal cramping and FTT.  Workup noted for acute liver injury, lactic acidosis, AKI.  Cardiology is consulted for acute on chronic cardiogenic shock, recurrent AF. PCCM to admit.  Pertinent  Medical History   Past Medical History:  Diagnosis Date   Acid reflux    AICD (automatic cardioverter/defibrillator) present    a. 08/2016 St. Jude (serial Number [id number]) biventricular ICD.   Anemia    Chronic systolic CHF (congestive heart failure) (HCC)    a. 08/2016 Echo: EF 20%, diffuse HK, inf, post AK, mod-sev MR, sev dil LA, mod TR, PASP .   COPD (chronic obstructive pulmonary disease) (HCC)    Coronary artery disease involving native coronary artery of native heart with angina pectoris (HCC)    a. 10/2012 MI after aortobifem bypass, found to have multivessel CAD -->CABG x3;  b. 08/2016 Cath: LM 60, LAD 17m, LCX 60ost/p, RCA 100p, VG->RPDA 50p, VG->OM1 ok, LIMA->LAD ok, EF 25%.   Essential hypertension 03/03/2012   History of blood transfusion 11/2012   "during his CABG"   History of gout X 1   History of stomach ulcers 1970s   Hyperlipidemia associated with type 2 diabetes mellitus (HCC) 09/16/2011   Ischemic cardiomyopathy    a. 08/2016 Echo: EF 20%, diffuse HK, inf, post AK;  b. 08/2016 St. Jude (serial Number [id number]) biventricular ICD.   Myocardial infarction [facility])    PAD (peripheral artery disease) (HCC)    s/p aortobifemoral bypass 10/2012   Peripheral neuropathy    PTSD (post-traumatic stress disorder)    Type II diabetes mellitus (HCC)    Ventricular tachycardia (HCC)    a. 08/2016 St.  Jude (serial Number [id number]) biventricular ICD-->oral amio added.   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 Is now on chronic suppressive antibiotics with dicloxacillin   Significant Hospital Events: Including procedures, antibiotic start and stop dates in addition to other pertinent events   11/16 admit, cardiology consulted 11/17 PMT consulted 11/19 DNI/ DNR 11/20 successful DCCV, changed to lasix gtt  Interim History / Subjective:  Worsened respiratory failure overnight. Today he is feeling a little better.   Objective   Blood pressure 121/66, pulse 88, temperature 97.6 F (36.4 C), temperature source Oral, resp. rate (!) 25, height 5\' 6"  (1.676 m), weight 57 kg, SpO2 96%. CVP:  [0 mmHg-86 mmHg] 3 mmHg      Intake/Output Summary (Last 24 hours) at 08/14/2023 1053 Last data filed at 08/14/2023 0900 Gross per 24 hour  Intake 1875.17 ml  Output 2500 ml  Net -624.83 ml   Filed Weights   08/12/23 0500 08/13/23 0600 08/14/23 0600  Weight: 53.7 kg 55.2 kg 57 kg   Examination: General: chronically ill appearing, cachectic man lying in bed in NAD Neuro: awake, alert, globally weak but moving all extremities CV: -paced rhythm PULM:   tachypnea but not using accessory muscles GI: soft, NT Extremities: stasis dermatitis, no significant edema Skin: warm, dry, no rashes  [place]  47> 51% Na+ 129 K+ 3.8 BUN 30 Cr 0.82 WBC  18.3 H/H 9.1/28.2 Platelets 206  CXR personally reviewed> pulmonary edema    Resolved problem list:   AKI Shock liver Encephalopathy   Assessment & Plan:   Acute on chronic cardiogenic shock  Acute on chronic HFrEF due to ischemic cardiomyopathy  CAD s/p CABG St. Jude CRT-D device Severe MR Hypovolemic hyponatremia  AKI, resolved Shock liver, improving Leukocytosis - 11/8 EF 35-40%, RV mod reduced, severe MR - s/p  successful DCCV  11/20.  Poor BiV pacing noted, s/p abott device rep w/ improvement in pacing burden P:  -increased milrinone dose -aggressive diuresis -con't tele monitoring -continues in paced rhythm -monitor electrolytes and replete as needed -discussion with Dr. Gala Romney at bedside regarding long-term prognosis and goals for his care. He feels very strongly that he wants to go home and spend his final time away from the hospital focusing on being comfortable -CM working on hospice, hopefully discharge tomorrow. If he acutely decompensates before then, would recommend inpatient comfort care  Persistent Afib/RVR on [facility] PTA - failed recent cardioversion in 06/2023. RVR may have contributed to decompensation then. Was back in Afib in clinic on 11/14-- was referred to ED but didn't go. Previously failed sotalol (didn't work) and amiodarone avoided due to possible lung toxicity-- diagnosed by PFTs and symptoms alone P:  -amio & eliquis -tele monitoring  Severe MR- was scheduled for mitral clip in Nov 2024, moved to 12/24 with Dr. Excell Seltzer -diuresis, inotropes  Pancolitis on CT- likely ischemic -Completed 7 days of Zosyn  Hypokalemia Hypomag Hypophos -no additional lab monitoring  PAD; previous aorto- bifem bypass -eliquis, statin  DM2 with uncontrolled hyperglycemia.  Variable p.o. intake and need for tube feeds has made this hard to manage. -no accuchecks or insulin since we are transitioning away from aggressive care towards comfort-focused care  COPD Ongoing tobacco abuse Pulmonary edema  - bronchodilators PRN  Acute metabolic encephalopathy, ICU delirium -meds for agitation if needed  H/o PUD Acute on chronic anemia/ IDA Severe malnutrition from cardiac cachexia/ FTT -PO intake as he wishes, can con't tF as tolerated  Deconditioning & debility Resulting Cachexia Severe protein energy malnutrition   Wife, daughter updated at bedside. See ipal note.    Best  Practice (right click and "Reselect all SmartList Selections" daily)   Diet/type: Regular consistency (see orders) DVT prophylaxis: apixaban GI prophylaxis: PPI Lines: Central line Foley:  N/A Code Status:  DNR/ DNI Last date of multidisciplinary goals of care discussion [08/06/23] Wife updated at bedside 11/23 am  This patient is critically ill with multiple organ system failure which requires frequent high complexity decision making, assessment, support, evaluation, and titration of therapies. This was completed through the application of advanced monitoring technologies and extensive interpretation of multiple databases. During this encounter critical care time was devoted to patient care services described in this note for 45 minutes.   Steffanie Dunn, DO 08/14/23 6:03 PM [facility]  For contact information, see Amion. If no response to pager, please call PCCM consult pager. After hours, 7PM- 7AM, please call Elink.

## 2023-08-15 DIAGNOSIS — R079 Chest pain, unspecified: Secondary | ICD-10-CM | POA: Diagnosis not present

## 2023-08-15 DIAGNOSIS — E1151 Type 2 diabetes mellitus with diabetic peripheral angiopathy without gangrene: Secondary | ICD-10-CM | POA: Diagnosis not present

## 2023-08-15 DIAGNOSIS — Z9181 History of falling: Secondary | ICD-10-CM | POA: Diagnosis not present

## 2023-08-15 DIAGNOSIS — R32 Unspecified urinary incontinence: Secondary | ICD-10-CM | POA: Diagnosis not present

## 2023-08-15 DIAGNOSIS — Z66 Do not resuscitate: Secondary | ICD-10-CM | POA: Diagnosis not present

## 2023-08-15 DIAGNOSIS — E1142 Type 2 diabetes mellitus with diabetic polyneuropathy: Secondary | ICD-10-CM | POA: Diagnosis not present

## 2023-08-15 DIAGNOSIS — Z7401 Bed confinement status: Secondary | ICD-10-CM | POA: Diagnosis not present

## 2023-08-15 DIAGNOSIS — K219 Gastro-esophageal reflux disease without esophagitis: Secondary | ICD-10-CM | POA: Diagnosis not present

## 2023-08-15 DIAGNOSIS — I5022 Chronic systolic (congestive) heart failure: Secondary | ICD-10-CM | POA: Diagnosis not present

## 2023-08-15 DIAGNOSIS — I252 Old myocardial infarction: Secondary | ICD-10-CM | POA: Diagnosis not present

## 2023-08-15 DIAGNOSIS — F4310 Post-traumatic stress disorder, unspecified: Secondary | ICD-10-CM | POA: Diagnosis not present

## 2023-08-15 DIAGNOSIS — R159 Full incontinence of feces: Secondary | ICD-10-CM | POA: Diagnosis not present

## 2023-08-15 DIAGNOSIS — R531 Weakness: Secondary | ICD-10-CM | POA: Diagnosis not present

## 2023-08-15 DIAGNOSIS — R570 Cardiogenic shock: Secondary | ICD-10-CM | POA: Diagnosis not present

## 2023-08-15 DIAGNOSIS — E785 Hyperlipidemia, unspecified: Secondary | ICD-10-CM | POA: Diagnosis not present

## 2023-08-15 DIAGNOSIS — I110 Hypertensive heart disease with heart failure: Secondary | ICD-10-CM | POA: Diagnosis not present

## 2023-08-15 DIAGNOSIS — F17200 Nicotine dependence, unspecified, uncomplicated: Secondary | ICD-10-CM | POA: Diagnosis not present

## 2023-08-15 DIAGNOSIS — J449 Chronic obstructive pulmonary disease, unspecified: Secondary | ICD-10-CM | POA: Diagnosis not present

## 2023-08-15 DIAGNOSIS — D649 Anemia, unspecified: Secondary | ICD-10-CM | POA: Diagnosis not present

## 2023-08-15 DIAGNOSIS — Z95810 Presence of automatic (implantable) cardiac defibrillator: Secondary | ICD-10-CM | POA: Diagnosis not present

## 2023-08-15 DIAGNOSIS — Z515 Encounter for palliative care: Secondary | ICD-10-CM | POA: Diagnosis not present

## 2023-08-15 DIAGNOSIS — I2510 Atherosclerotic heart disease of native coronary artery without angina pectoris: Secondary | ICD-10-CM | POA: Diagnosis not present

## 2023-08-15 DIAGNOSIS — Z7189 Other specified counseling: Secondary | ICD-10-CM | POA: Diagnosis not present

## 2023-08-15 NOTE — Plan of Care (Signed)
  Problem: Coping: Goal: Ability to adjust to condition or change in health will improve Outcome: Progressing   Problem: Health Behavior/Discharge Planning: Goal: Ability to manage health-related needs will improve Outcome: Progressing   Problem: Clinical Measurements: Goal: Respiratory complications will improve Outcome: Progressing   Problem: Coping: Goal: Level of anxiety will decrease Outcome: Progressing

## 2023-08-15 NOTE — Progress Notes (Signed)
   Palliative Medicine Inpatient Follow Up Note HPI: 77 year old man w/ hx of CAD post CABG, ischemic cardiomyopathy s/p CRT-D, DM, severe MVR, PADs/p aortobifemoral bypass, Afib presenting with increasing fatigue, weakness, anorexia, N/V, abdominal cramping and FTT. Palliative care consulted for goals of care conversations in the setting of end stage disease.   Today's Discussion 08/15/2023  *Please note that this is a verbal dictation therefore any spelling or grammatical errors are due to the "Dragon Medical One" system interpretation.  Chart reviewed inclusive of vital signs, progress notes, laboratory results, and diagnostic images.  I met at bedside with [name] [name], his wife, [name] [name], and his daughter [name] [name].   We discussed that [name] [name] has decided to move forward with hospice care. He is looking forward to getting home later today and spending time watching his birds. His daughter shares that he has upwards of 52 bird feeders. [name] [name] expresses peace with his decision.  We discussed deactivation of patients AICD which they are in agreement with. Patients RN, Yvonna Alanis informed of this.   Have reached out to [facility] CM to verify plan. She shares that hospice will reach out to family regarding DME delivery.   Plan to remove all IV lines and coretrack when ambulance arrives.   Questions and concerns addressed/Palliative Support Provided.   Objective Assessment: Vital Signs Vitals:   08/15/23 0700 08/15/23 0800  BP: 120/63 110/60  Pulse: 87 88  Resp: (!) 21 20  Temp:  98.2 F (36.8 C)  SpO2: (!) 87% (!) 84%    Intake/Output Summary (Last 24 hours) at 08/15/2023 0915 Last data filed at 08/15/2023 0800 Gross per 24 hour  Intake 1458.56 ml  Output 2900 ml  Net -1441.44 ml   Last Weight  Most recent update: 08/14/2023  6:07 AM    Weight  57 kg (125 lb 10.6 oz)            Gen: Extremely frail elderly Caucasian male chronically ill in appearance HEENT: coretrack in place, moist  mucous membranes CV: Regular rate and irregular rhythm  PULM: On 4LPM [place], breathing is even and nonlabored ABD: soft/nontender  EXT: LE edema Neuro: Alert and oriented x3 - hard of hearing  SUMMARY OF RECOMMENDATIONS   DNAR/DNI  Order placed to deactivate AICD  Plan to transition home with [facility] today   Billing based on MDM: Moderate ______________________________________________________________________________________ [name] [name] [place] [facility] Team Team Cell Phone: [phone] Please utilize secure chat with additional questions, if there is no response within 30 minutes please call the above phone number  Palliative Medicine Team providers are available by phone from 7am to 7pm daily and can be reached through the team cell phone.  Should this patient require assistance outside of these hours, please call the patient's attending physician.

## 2023-08-15 NOTE — TOC Initial Note (Signed)
Transition of Care [facility]) - Initial/Assessment Note    Patient Details  Name: [name] [name] MRN: [id number] Date of Birth: [birth year]  Transition of Care [facility]) CM/SW Contact:    Elliot Cousin, RN Phone Number: [phone] 08/15/2023, 12:27 PM  Clinical Narrative:   Spoke to pt, wife and daughter at bedside. [facility] have supplied DME to home. PTAR contacted, DNR on shadow chart.  Pt is in good spirits and ready to dc home with his family.                  Expected Discharge Plan: Home w Hospice Care Barriers to Discharge: No Barriers Identified   Patient Goals and CMS Choice Patient states their goals for this hospitalization and ongoing recovery are:: to go home w home hospice CMS Medicare.gov Compare Post Acute Care list provided to:: Patient Choice offered to / list presented to : Patient, Spouse, Adult Children      Expected Discharge Plan and Services   Discharge Planning Services: CM Consult Post Acute Care Choice: Hospice Living arrangements for the past 2 months: Single Family Home                           HH Arranged: RN HH Agency: [facility] ([facility]) Date [facility] Agency Contacted: 08/15/23 Time HH Agency Contacted: [phone] Representative spoke with at [facility] Agency: Rhunette Croft  Prior Living Arrangements/Services Living arrangements for the past 2 months: Single Family Home Lives with:: Spouse Patient language and need for interpreter reviewed:: Yes Do you feel safe going back to the place where you live?: Yes      Need for Family Participation in Patient Care: Yes (Comment) Care giver support system in place?: Yes (comment)   Criminal Activity/Legal Involvement Pertinent to Current Situation/Hospitalization: No - Comment as needed  Activities of Daily Living   ADL Screening (condition at time of admission) Independently performs ADLs?: No Does the patient have a NEW difficulty with  bathing/dressing/toileting/self-feeding that is expected to last >3 days?: Yes (Initiates electronic notice to provider for possible OT consult) Does the patient have a NEW difficulty with getting in/out of bed, walking, or climbing stairs that is expected to last >3 days?: Yes (Initiates electronic notice to provider for possible PT consult) Does the patient have a NEW difficulty with communication that is expected to last >3 days?: No Is the patient deaf or have difficulty hearing?: No Does the patient have difficulty seeing, even when wearing glasses/contacts?: No Does the patient have difficulty concentrating, remembering, or making decisions?: Yes  Permission Sought/Granted Permission sought to share information with : PCP, Family Supports Permission granted to share information with : Yes, Verbal Permission Granted  Share Information with NAME: Tyreek Kennemer  Permission granted to share info w AGENCY: Home Hospice  Permission granted to share info w Relationship: wife  Permission granted to share info w Contact Information: [phone]  Emotional Assessment Appearance:: Appears stated age Attitude/Demeanor/Rapport: Engaged Affect (typically observed): Accepting Orientation: : Oriented to Self, Oriented to Place, Oriented to  Time, Oriented to Situation   Psych Involvement: No (comment)  Admission diagnosis:  Cardiogenic shock (HCC) [R57.0] Persistent atrial fibrillation (HCC) [I48.19] Patient Active Problem List   Diagnosis Date Noted   Hypokalemia 08/13/2023   Frailty 08/13/2023   Cachexia (HCC) 08/10/2023   Severe protein-energy malnutrition (HCC) 08/10/2023   Acute on chronic HFrEF (heart failure with reduced ejection fraction) (HCC) 08/10/2023  Protein-calorie malnutrition, severe 08/08/2023   Cardiogenic shock (HCC) 08/06/2023   Persistent atrial fibrillation (HCC) 08/04/2023   Hypercoagulable state due to persistent atrial fibrillation (HCC) 08/04/2023   Acute heart  failure with reduced ejection fraction (HFrEF, <= 40%) (HCC) 07/01/2023   Acute exacerbation of CHF (congestive heart failure) (HCC) 06/30/2023   Nonrheumatic mitral valve regurgitation 03/25/2023   Atrial fibrillation with RVR (HCC) 02/17/2023   Holosystolic murmur 08/16/2022   Fatigue 08/16/2022   Malnutrition of mild degree (HCC) 08/16/2022   Myocardial infarction [facility])    Critical lower limb ischemia (HCC) 05/05/2021   Chronic systolic heart failure (HCC) 02/05/2021   S/P AAA repair 12/29/2020   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   Paroxysmal atrial fibrillation (HCC) 01/15/2020   Lung nodule, multiple 04/21/2018   Status post aortobifemoral bypass surgery 09/05/2017   Exercise-induced leg fatigue 09/05/2017   COPD without exacerbation (HCC) 11/11/2016   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, but Also Potentially Arrhythmogenic 09/03/2016   Chronic HFrEF (heart failure with reduced ejection fraction) (HCC) 09/03/2016   Hypomagnesemia 09/03/2016   Hyperkalemia - resolved    Ventricular tachycardia (HCC)    Atypical angina (HCC) 09/01/2016   Progressive angina (HCC) - atypical - related to arrhythmia 09/01/2016   Coronary artery disease involving native coronary artery of native heart with angina pectoris (HCC) 09/01/2016   PAD (peripheral artery disease) (HCC)    S/P CABG x 3 10/21/2012   Essential hypertension 03/03/2012   Tobacco use disorder 03/03/2012   Hyponatremia 09/18/2011   Orchitis and epididymitis 09/16/2011   Diabetes mellitus type II, non insulin dependent (HCC) 09/16/2011   Hyperlipidemia associated with type 2 diabetes mellitus (HCC) 09/16/2011   PCP:  [facility] Pharmacy:   [facility] [place],  - [address] [place] [address] Phone: [phone] Fax: [phone]     Social Determinants of Health (SDOH) Social History: SDOH Screenings   Food Insecurity: No  Food Insecurity (08/08/2023)  Housing: Low Risk  (08/08/2023)  Transportation Needs: No Transportation Needs (08/08/2023)  Utilities: Not At Risk (08/08/2023)  Tobacco Use: High Risk (08/06/2023)   SDOH Interventions:     Readmission Risk Interventions     No data to display

## 2023-08-15 NOTE — Progress Notes (Signed)
Nutrition Brief Note  Chart reviewed. Reviewed Palliative Care notes and noted plan to discharge today to home with hospice. Cortrak to be removed prior to discharge.  No further nutrition interventions planned at this time.  Please re-consult as needed.   Romelle Starcher MS, RDN, LDN, CNSC Registered Dietitian 3 Clinical Nutrition RD Pager and On-Call Pager Number Located in [place]

## 2023-08-15 NOTE — TOC Progression Note (Signed)
Transition of Care [facility]) - Progression Note    Patient Details  Name: [name] [name] MRN: [id number] Date of Birth: 1946-05-24  Transition of Care [facility]) CM/SW Contact  Elliot Cousin, RN Phone Number: [phone] 08/15/2023, 8:34 AM  Clinical Narrative:    CM contacted [facility], and they will have DME delivered to home today for pt's dc to home.  Will arrange PTAR transportation. Will need Gold DNR form completed.     Expected Discharge Plan: Home w Hospice Care Barriers to Discharge: Equipment Delay  Expected Discharge Plan and Services   Discharge Planning Services: CM Consult   Living arrangements for the past 2 months: Single Family Home                             HH Agency: [facility] Date [facility] Agency Contacted: 08/14/23 Time HH Agency Contacted: 1327 Representative spoke with at [facility] Agency: [facility]   Social Determinants of Health (SDOH) Interventions SDOH Screenings   Food Insecurity: No Food Insecurity (08/08/2023)  Housing: Low Risk  (08/08/2023)  Transportation Needs: No Transportation Needs (08/08/2023)  Utilities: Not At Risk (08/08/2023)  Tobacco Use: High Risk (08/06/2023)    Readmission Risk Interventions     No data to display

## 2023-08-15 NOTE — Consult Note (Signed)
[facility] Liaison Consult Note    08/15/2023  [name] [name] [birth year] [id number]  Insurance: Medicare ACO [place]  Primary Care Provider: [facility], [facility]  Chart reviewed  for 8 day LLOS, as reviewed from Inpatient [facility] and Palliative consult notes and reveals the patient is currently transitioning to Home with Hospice.  Plan: Patient will have full care coordination services through Hospice and needs will be met at the hospice level of care. No planned follow up for transitional needs. Will sign off at transition from hospital. Patient noted with [facility].  For questions,   Charlesetta Shanks, RN, BSN, CCM [place]  [facility], Population Health, [facility] Liaison Direct Dial: [phone] or secure chat Email: [email or web address]

## 2023-08-15 NOTE — Plan of Care (Signed)
  Problem: Education: Goal: Ability to describe self-care measures that may prevent or decrease complications (Diabetes Survival Skills Education) will improve Outcome: Adequate for Discharge Goal: Individualized Educational Video(s) Outcome: Adequate for Discharge   Problem: Education: Goal: Individualized Educational Video(s) Outcome: Adequate for Discharge   Problem: Coping: Goal: Ability to adjust to condition or change in health will improve Outcome: Adequate for Discharge   Problem: Fluid Volume: Goal: Ability to maintain a balanced intake and output will improve Outcome: Adequate for Discharge   Problem: Health Behavior/Discharge Planning: Goal: Ability to identify and utilize available resources and services will improve Outcome: Adequate for Discharge Goal: Ability to manage health-related needs will improve Outcome: Adequate for Discharge   Problem: Metabolic: Goal: Ability to maintain appropriate glucose levels will improve Outcome: Adequate for Discharge   Problem: Nutritional: Goal: Maintenance of adequate nutrition will improve Outcome: Adequate for Discharge Goal: Progress toward achieving an optimal weight will improve Outcome: Adequate for Discharge   Problem: Skin Integrity: Goal: Risk for impaired skin integrity will decrease Outcome: Adequate for Discharge   Problem: Tissue Perfusion: Goal: Adequacy of tissue perfusion will improve Outcome: Adequate for Discharge   Problem: Education: Goal: Knowledge of General Education information will improve Description: Including pain rating scale, medication(s)/side effects and non-pharmacologic comfort measures Outcome: Adequate for Discharge   Problem: Health Behavior/Discharge Planning: Goal: Ability to manage health-related needs will improve Outcome: Adequate for Discharge   Problem: Clinical Measurements: Goal: Ability to maintain clinical measurements within normal limits will improve Outcome:  Adequate for Discharge Goal: Will remain free from infection Outcome: Adequate for Discharge Goal: Diagnostic test results will improve Outcome: Adequate for Discharge Goal: Respiratory complications will improve Outcome: Adequate for Discharge Goal: Cardiovascular complication will be avoided Outcome: Adequate for Discharge   Problem: Activity: Goal: Risk for activity intolerance will decrease Outcome: Adequate for Discharge   Problem: Nutrition: Goal: Adequate nutrition will be maintained Outcome: Adequate for Discharge   Problem: Coping: Goal: Level of anxiety will decrease Outcome: Adequate for Discharge   Problem: Elimination: Goal: Will not experience complications related to bowel motility Outcome: Adequate for Discharge Goal: Will not experience complications related to urinary retention Outcome: Adequate for Discharge   Problem: Pain Management: Goal: General experience of comfort will improve Outcome: Adequate for Discharge   Problem: Safety: Goal: Ability to remain free from injury will improve Outcome: Adequate for Discharge   Problem: Skin Integrity: Goal: Risk for impaired skin integrity will decrease Outcome: Adequate for Discharge

## 2023-08-15 NOTE — Progress Notes (Signed)
08/15/2023   I have seen and evaluated the patient for end stage heart failure.   S:  No events, in fairly good spirits.   O: Blood pressure 96/66, pulse 64, temperature 97.6 F (36.4 C), temperature source Oral, resp. rate 17, height 5\' 3"  (1.6 m), weight 81.6 kg, SpO2 94 %.    Cachetic man in NAD Moves to command Globally weak +muscle wasting AOx3   A:  End stage heart failure without further treatment options   P:  Home hospice is being set up appreciate TOC help Appreciate ongoing PMT, AHF support DC cortrak DC central line and milrinone when going home   Katrinka Blazing MD [facility] Prefer epic messenger for cross cover needs If after hours, please call E-link

## 2023-08-17 ENCOUNTER — Ambulatory Visit: Payer: No Typology Code available for payment source | Admitting: Cardiology

## 2023-08-17 NOTE — Discharge Summary (Signed)
Physician Discharge Summary  Patient ID: [name] [name] MRN: [id number] DOB/AGE: 1945/10/31 77 y.o.  Admit date: 08/06/2023 Discharge date: 08/17/2023  Admission Diagnoses: failure to thrive  Discharge Diagnoses:  Principal Problem:   Cardiogenic shock (HCC) Active Problems:   Protein-calorie malnutrition, severe   Cachexia (HCC)   Severe protein-energy malnutrition (HCC)   Acute on chronic HFrEF (heart failure with reduced ejection fraction) (HCC)   Hypokalemia   Frailty   Discharged Condition: comfort  Hospital Course:  77 year old man w/ hx of CAD post CABG, ischemic cardiomyopathy s/p CRT-D, DM, severe MVR, PADs/p aortobifemoral bypass, Afib presenting with increasing fatigue, weakness, anorexia, N/V, abdominal cramping and FTT. Workup noted for acute liver injury, lactic acidosis, AKI. Cardiology is consulted for acute on chronic cardiogenic shock, recurrent AF. PCCM to admit.   11/16 admit, cardiology consulted 11/17 PMT consulted 11/19 DNI/ DNR 11/20 successful DCCV, changed to lasix gtt  Despite aggressive care, diuresis and inotropes, patient failed to improve.  GOC discussions held and patient wanted to go home with hospice services in place to be with family.   Discharge Exam: Blood pressure (!) 115/59, pulse 85, temperature 98.2 F (36.8 C), resp. rate 19, height 5\' 6"  (1.676 m), weight 57 kg, SpO2 (!) 86%. Cachetic man in NAD Moves to command Globally weak +muscle wasting AOx3  Disposition: Discharge disposition: 50-Hospice/Home        Allergies as of 08/15/2023       Reactions   Lisinopril    Other reaction(s): Cough        Medication List     STOP taking these medications    digoxin 0.125 MG tablet Commonly known as: LANOXIN   diphenhydrAMINE 25 mg capsule Commonly known as: BENADRYL   metoprolol succinate 25 MG 24 hr tablet Commonly known as: Toprol XL   mexiletine 150 MG capsule Commonly known as: MEXITIL   potassium  chloride SA 20 MEQ tablet Commonly known as: Klor-Con M20   sitaGLIPtin 50 MG tablet Commonly known as: JANUVIA       TAKE these medications    acetaminophen 650 MG CR tablet Commonly known as: TYLENOL Take 650 mg by mouth every 8 (eight) hours as needed for pain.   Alpha-Lipoic Acid 600 MG Tabs Take 600 mg by mouth daily.   apixaban 5 MG Tabs tablet Commonly known as: ELIQUIS Take 1 tablet (5 mg total) by mouth 2 (two) times daily.   aspirin EC 81 MG tablet Take 81 mg by mouth daily.   atorvastatin 40 MG tablet Commonly known as: LIPITOR Take 40 mg by mouth every evening.   Cholecalciferol 50 MCG (2000 UT) Tabs Take 2,000 Units by mouth daily.   dicloxacillin 500 MG capsule Commonly known as: DYNAPEN Take 500 mg by mouth 2 (two) times daily.   empagliflozin 25 MG Tabs tablet Commonly known as: Jardiance Take 1 tablet (25 mg total) by mouth daily.   ENSURE PLUS PO Take 1-3 Cans by mouth 2 (two) times daily. Additional can, if  he is not eating a day   fexofenadine 180 MG tablet Commonly known as: ALLEGRA Take 180 mg by mouth daily.   furosemide 80 MG tablet Commonly known as: LASIX Take 80 mg by mouth daily.   glipiZIDE 5 MG tablet Commonly known as: GLUCOTROL Take 5 mg by mouth 2 (two) times daily before a meal.   lidocaine 5 % Commonly known as: LIDODERM Place 1 patch onto the skin daily as needed (For pain).  magnesium oxide 400 (241.3 Mg) MG tablet Commonly known as: MAG-OX Take 1 tablet (400 mg total) by mouth daily.   metFORMIN 1000 MG tablet Commonly known as: GLUCOPHAGE Take 1 tablet (1,000 mg total) by mouth 2 (two) times daily with a meal. Resume on 09/06/2016   miconazole 2 % cream Commonly known as: MICOTIN Apply 1 Application topically 2 (two) times daily.   nitroGLYCERIN 0.4 MG SL tablet Commonly known as: NITROSTAT Place 1 tablet (0.4 mg total) under the tongue every 5 (five) minutes x 3 doses as needed for chest pain.    pantoprazole 40 MG tablet Commonly known as: PROTONIX Take 1 tablet (40 mg total) by mouth daily.   Spiriva Respimat 2.5 MCG/ACT Aers Generic drug: Tiotropium Bromide Monohydrate Inhale 2 puffs into the lungs at bedtime.   spironolactone 25 MG tablet Commonly known as: ALDACTONE Take 0.5 tablets (12.5 mg total) by mouth daily.   vitamin A & D ointment Apply 1 Application topically as needed for dry skin (on bottoms).        Follow-up Information     [facility] Follow up.   Why: Home Hospice will contact you to arrange Home Hospice admission Contact information: [address] [place] [address] [phone]                 Signed: Lorin Glass 08/17/2023, 9:57 AM

## 2023-08-19 DIAGNOSIS — E1142 Type 2 diabetes mellitus with diabetic polyneuropathy: Secondary | ICD-10-CM | POA: Diagnosis not present

## 2023-08-19 DIAGNOSIS — K219 Gastro-esophageal reflux disease without esophagitis: Secondary | ICD-10-CM | POA: Diagnosis not present

## 2023-08-19 DIAGNOSIS — J449 Chronic obstructive pulmonary disease, unspecified: Secondary | ICD-10-CM | POA: Diagnosis not present

## 2023-08-19 DIAGNOSIS — E785 Hyperlipidemia, unspecified: Secondary | ICD-10-CM | POA: Diagnosis not present

## 2023-08-19 DIAGNOSIS — I5022 Chronic systolic (congestive) heart failure: Secondary | ICD-10-CM | POA: Diagnosis not present

## 2023-08-19 DIAGNOSIS — I110 Hypertensive heart disease with heart failure: Secondary | ICD-10-CM | POA: Diagnosis not present

## 2023-08-21 DIAGNOSIS — Z9181 History of falling: Secondary | ICD-10-CM | POA: Diagnosis not present

## 2023-08-21 DIAGNOSIS — F17200 Nicotine dependence, unspecified, uncomplicated: Secondary | ICD-10-CM | POA: Diagnosis not present

## 2023-08-21 DIAGNOSIS — R159 Full incontinence of feces: Secondary | ICD-10-CM | POA: Diagnosis not present

## 2023-08-21 DIAGNOSIS — E785 Hyperlipidemia, unspecified: Secondary | ICD-10-CM | POA: Diagnosis not present

## 2023-08-21 DIAGNOSIS — Z66 Do not resuscitate: Secondary | ICD-10-CM | POA: Diagnosis not present

## 2023-08-21 DIAGNOSIS — I5022 Chronic systolic (congestive) heart failure: Secondary | ICD-10-CM | POA: Diagnosis not present

## 2023-08-21 DIAGNOSIS — Z515 Encounter for palliative care: Secondary | ICD-10-CM | POA: Diagnosis not present

## 2023-08-21 DIAGNOSIS — E1151 Type 2 diabetes mellitus with diabetic peripheral angiopathy without gangrene: Secondary | ICD-10-CM | POA: Diagnosis not present

## 2023-08-21 DIAGNOSIS — D649 Anemia, unspecified: Secondary | ICD-10-CM | POA: Diagnosis not present

## 2023-08-21 DIAGNOSIS — I252 Old myocardial infarction: Secondary | ICD-10-CM | POA: Diagnosis not present

## 2023-08-21 DIAGNOSIS — J449 Chronic obstructive pulmonary disease, unspecified: Secondary | ICD-10-CM | POA: Diagnosis not present

## 2023-08-21 DIAGNOSIS — I2510 Atherosclerotic heart disease of native coronary artery without angina pectoris: Secondary | ICD-10-CM | POA: Diagnosis not present

## 2023-08-21 DIAGNOSIS — I110 Hypertensive heart disease with heart failure: Secondary | ICD-10-CM | POA: Diagnosis not present

## 2023-08-21 DIAGNOSIS — F4310 Post-traumatic stress disorder, unspecified: Secondary | ICD-10-CM | POA: Diagnosis not present

## 2023-08-21 DIAGNOSIS — R32 Unspecified urinary incontinence: Secondary | ICD-10-CM | POA: Diagnosis not present

## 2023-08-21 DIAGNOSIS — Z95810 Presence of automatic (implantable) cardiac defibrillator: Secondary | ICD-10-CM | POA: Diagnosis not present

## 2023-08-21 DIAGNOSIS — E1142 Type 2 diabetes mellitus with diabetic polyneuropathy: Secondary | ICD-10-CM | POA: Diagnosis not present

## 2023-08-21 DIAGNOSIS — K219 Gastro-esophageal reflux disease without esophagitis: Secondary | ICD-10-CM | POA: Diagnosis not present

## 2023-08-24 ENCOUNTER — Encounter (HOSPITAL_COMMUNITY): Admission: RE | Payer: No Typology Code available for payment source | Source: Home / Self Care

## 2023-08-24 ENCOUNTER — Inpatient Hospital Stay (HOSPITAL_COMMUNITY)
Admission: RE | Admit: 2023-08-24 | Payer: No Typology Code available for payment source | Source: Home / Self Care | Admitting: Cardiovascular Disease

## 2023-08-24 DIAGNOSIS — I340 Nonrheumatic mitral (valve) insufficiency: Secondary | ICD-10-CM

## 2023-08-24 SURGERY — MITRAL VALVE REPAIR
Anesthesia: General

## 2023-08-29 ENCOUNTER — Telehealth: Payer: Self-pay | Admitting: Internal Medicine

## 2023-08-29 ENCOUNTER — Encounter (HOSPITAL_COMMUNITY): Payer: No Typology Code available for payment source

## 2023-08-29 NOTE — Telephone Encounter (Signed)
Pt had a [employer] box and daughter wants to know what they should do with it. Daughter would like a call back.

## 2023-08-29 NOTE — Telephone Encounter (Signed)
I spoke with the patient daughter and gave her my deepest condolences. I gave her the number to [facility] tech support to order a return kit for the monitor.

## 2023-09-06 ENCOUNTER — Encounter (HOSPITAL_COMMUNITY): Payer: No Typology Code available for payment source | Admitting: Cardiology

## 2023-09-21 DEATH — deceased

## 2023-12-13 ENCOUNTER — Ambulatory Visit: Payer: No Typology Code available for payment source

## 2023-12-13 ENCOUNTER — Encounter (HOSPITAL_COMMUNITY): Payer: No Typology Code available for payment source
